# Patient Record
Sex: Female | Born: 1955
Health system: Southern US, Community
[De-identification: ages and names within clinical notes are randomized; demographics above are authoritative.]

## PROBLEM LIST (undated history)

## (undated) DIAGNOSIS — M419 Scoliosis, unspecified: Secondary | ICD-10-CM

## (undated) DIAGNOSIS — B3781 Candidal esophagitis: Secondary | ICD-10-CM

## (undated) DIAGNOSIS — M1990 Unspecified osteoarthritis, unspecified site: Secondary | ICD-10-CM

## (undated) DIAGNOSIS — M159 Polyosteoarthritis, unspecified: Secondary | ICD-10-CM

## (undated) DIAGNOSIS — J189 Pneumonia, unspecified organism: Secondary | ICD-10-CM

## (undated) DIAGNOSIS — E538 Deficiency of other specified B group vitamins: Secondary | ICD-10-CM

## (undated) DIAGNOSIS — E785 Hyperlipidemia, unspecified: Secondary | ICD-10-CM

## (undated) DIAGNOSIS — M48061 Spinal stenosis, lumbar region without neurogenic claudication: Secondary | ICD-10-CM

## (undated) DIAGNOSIS — F32A Depression, unspecified: Secondary | ICD-10-CM

## (undated) DIAGNOSIS — T7840XA Allergy, unspecified, initial encounter: Secondary | ICD-10-CM

## (undated) DIAGNOSIS — F419 Anxiety disorder, unspecified: Secondary | ICD-10-CM

## (undated) DIAGNOSIS — D126 Benign neoplasm of colon, unspecified: Secondary | ICD-10-CM

## (undated) DIAGNOSIS — I10 Essential (primary) hypertension: Secondary | ICD-10-CM

## (undated) DIAGNOSIS — H269 Unspecified cataract: Secondary | ICD-10-CM

## (undated) DIAGNOSIS — F329 Major depressive disorder, single episode, unspecified: Secondary | ICD-10-CM

## (undated) DIAGNOSIS — K589 Irritable bowel syndrome without diarrhea: Secondary | ICD-10-CM

## (undated) DIAGNOSIS — Q282 Arteriovenous malformation of cerebral vessels: Secondary | ICD-10-CM

## (undated) DIAGNOSIS — R42 Dizziness and giddiness: Secondary | ICD-10-CM

## (undated) HISTORY — DX: Scoliosis, unspecified: M41.9

## (undated) HISTORY — PX: APPENDECTOMY: SHX54

## (undated) HISTORY — DX: Polyosteoarthritis, unspecified: M15.9

## (undated) HISTORY — DX: Hyperlipidemia, unspecified: E78.5

## (undated) HISTORY — DX: Essential (primary) hypertension: I10

## (undated) HISTORY — DX: Major depressive disorder, single episode, unspecified: F32.9

## (undated) HISTORY — DX: Candidal esophagitis: B37.81

## (undated) HISTORY — DX: Unspecified osteoarthritis, unspecified site: M19.90

## (undated) HISTORY — PX: OOPHORECTOMY: SHX86

## (undated) HISTORY — DX: Unspecified cataract: H26.9

## (undated) HISTORY — DX: Allergy, unspecified, initial encounter: T78.40XA

## (undated) HISTORY — DX: Spinal stenosis, lumbar region without neurogenic claudication: M48.061

## (undated) HISTORY — DX: Arteriovenous malformation of cerebral vessels: Q28.2

## (undated) HISTORY — PX: CARDIAC CATHETERIZATION: SHX172

## (undated) HISTORY — DX: Deficiency of other specified B group vitamins: E53.8

## (undated) HISTORY — DX: Depression, unspecified: F32.A

## (undated) HISTORY — DX: Benign neoplasm of colon, unspecified: D12.6

## (undated) HISTORY — PX: ABDOMINAL HYSTERECTOMY: SHX81

## (undated) HISTORY — PX: NASAL SINUS SURGERY: SHX719

## (undated) HISTORY — PX: TONSILLECTOMY: SUR1361

---

## 2001-02-15 ENCOUNTER — Other Ambulatory Visit: Admission: RE | Admit: 2001-02-15 | Discharge: 2001-02-15 | Payer: Self-pay | Admitting: Family Medicine

## 2005-01-09 ENCOUNTER — Ambulatory Visit: Payer: Self-pay | Admitting: Specialist

## 2005-02-02 ENCOUNTER — Ambulatory Visit: Payer: Self-pay | Admitting: Specialist

## 2005-02-20 HISTORY — PX: SHOULDER ARTHROSCOPY WITH ROTATOR CUFF REPAIR AND SUBACROMIAL DECOMPRESSION: SHX5686

## 2006-07-23 ENCOUNTER — Encounter: Admission: RE | Admit: 2006-07-23 | Discharge: 2006-07-23 | Payer: Self-pay | Admitting: Orthopedic Surgery

## 2007-02-05 ENCOUNTER — Encounter (INDEPENDENT_AMBULATORY_CARE_PROVIDER_SITE_OTHER): Payer: Self-pay | Admitting: Gastroenterology

## 2007-02-05 ENCOUNTER — Ambulatory Visit (HOSPITAL_COMMUNITY): Admission: RE | Admit: 2007-02-05 | Discharge: 2007-02-05 | Payer: Self-pay | Admitting: Gastroenterology

## 2007-02-07 ENCOUNTER — Ambulatory Visit: Payer: Self-pay | Admitting: Pain Medicine

## 2007-02-16 ENCOUNTER — Encounter: Admission: RE | Admit: 2007-02-16 | Discharge: 2007-02-16 | Payer: Self-pay

## 2007-03-07 ENCOUNTER — Encounter: Admission: RE | Admit: 2007-03-07 | Discharge: 2007-03-07 | Payer: Self-pay | Admitting: Neurology

## 2007-05-30 ENCOUNTER — Encounter: Admission: RE | Admit: 2007-05-30 | Discharge: 2007-05-30 | Payer: Self-pay | Admitting: Neurology

## 2007-06-13 ENCOUNTER — Encounter: Admission: RE | Admit: 2007-06-13 | Discharge: 2007-06-13 | Payer: Self-pay | Admitting: Neurology

## 2008-04-07 ENCOUNTER — Emergency Department (HOSPITAL_COMMUNITY): Admission: EM | Admit: 2008-04-07 | Discharge: 2008-04-07 | Payer: Self-pay | Admitting: Emergency Medicine

## 2008-07-22 ENCOUNTER — Ambulatory Visit: Payer: Self-pay | Admitting: Family

## 2009-10-27 ENCOUNTER — Encounter: Admission: RE | Admit: 2009-10-27 | Discharge: 2009-10-27 | Payer: Self-pay | Admitting: Neurology

## 2009-11-02 ENCOUNTER — Ambulatory Visit: Payer: Self-pay | Admitting: Cardiology

## 2009-11-11 ENCOUNTER — Encounter: Admission: RE | Admit: 2009-11-11 | Discharge: 2009-11-11 | Payer: Self-pay | Admitting: Neurology

## 2009-12-15 ENCOUNTER — Encounter: Admission: RE | Admit: 2009-12-15 | Discharge: 2009-12-15 | Payer: Self-pay | Admitting: Neurology

## 2010-04-20 ENCOUNTER — Ambulatory Visit: Payer: Self-pay | Admitting: Internal Medicine

## 2010-06-07 LAB — CBC
HCT: 35.6 % — ABNORMAL LOW (ref 36.0–46.0)
Hemoglobin: 12.5 g/dL (ref 12.0–15.0)
MCHC: 35.1 g/dL (ref 30.0–36.0)
MCV: 96.4 fL (ref 78.0–100.0)
Platelets: 383 10*3/uL (ref 150–400)
RBC: 3.7 MIL/uL — ABNORMAL LOW (ref 3.87–5.11)
RDW: 12.5 % (ref 11.5–15.5)
WBC: 7.1 10*3/uL (ref 4.0–10.5)

## 2010-06-07 LAB — BASIC METABOLIC PANEL
BUN: 23 mg/dL (ref 6–23)
CO2: 22 mEq/L (ref 19–32)
Calcium: 9 mg/dL (ref 8.4–10.5)
Chloride: 105 mEq/L (ref 96–112)
Creatinine, Ser: 0.87 mg/dL (ref 0.4–1.2)
GFR calc Af Amer: >60 mL/min (ref >60)
GFR calc non Af Amer: >60 mL/min (ref >60)
Glucose, Bld: 89 mg/dL (ref 70–99)
Potassium: 3.7 mEq/L (ref 3.5–5.1)
Sodium: 136 mEq/L (ref 135–145)

## 2010-06-07 LAB — POCT CARDIAC MARKERS
CKMB, poc: <1 ng/mL — ABNORMAL LOW (ref 1.0–8.0)
Myoglobin, poc: 72.6 ng/mL (ref 12–200)
Troponin i, poc: <0.05 ng/mL (ref 0.00–0.09)

## 2010-07-04 ENCOUNTER — Other Ambulatory Visit: Payer: Self-pay | Admitting: Orthopedic Surgery

## 2010-07-04 DIAGNOSIS — M25511 Pain in right shoulder: Secondary | ICD-10-CM

## 2010-07-05 NOTE — Op Note (Signed)
NAMEEMMALOU, HUNGER               ACCOUNT NO.:  [id number]   MEDICAL RECORD NO.:  [id number]          PATIENT TYPE:  AMB   LOCATION:  ENDO                         FACILITY:  [facility]   PHYSICIAN:  Anselmo Rod, M.D.  DATE OF BIRTH:  Mar 04, 1955   DATE OF PROCEDURE:  02/05/2007  DATE OF DISCHARGE:                               OPERATIVE REPORT   PROCEDURE PERFORMED:  Colonoscopy with cold biopsies x 4.   ENDOSCOPIST:  Anselmo Rod, M.D.   INSTRUMENT USED:  Pentax video colonoscope.   INDICATIONS FOR PROCEDURE:  A 55 year old white female undergoing a  screening colonoscopy.  The patient has a history of constipation, rule  out colonic polyps, masses, etc.   PREPROCEDURE PREPARATION:  Informed consent was procured from the  patient.  The patient fasted for 4 hours prior to the procedure after  being prepped with 20 OsmoPrep pills the night of and 12 OsmoPrep pills  the morning of the procedure.  The risks and benefits of the procedure  including a 10% miss rate of cancer and polyp were discussed with the  patient as well.   PREPROCEDURE PHYSICAL:  The patient had stable vital signs.  Neck  supple.  Chest clear to auscultation.  S1 S2 regular.  Abdomen soft with  normal bowel sounds.   DESCRIPTION OF PROCEDURE:  The patient was placed in the left lateral  decubitus position and sedated with 110 mcg of Fentanyl and 10 mg of  Versed given intravenously in slow incremental doses.  Once the patient  was adequately sedated and maintained on low-flow oxygen and continuous  cardiac monitoring, the Pentax video colonoscope was advanced from the  rectum to the cecum.  There was significant amount of residual stool in  the colon.  Multiple washes were done. The appendiceal orifice and  ileocecal valve were visualized after changing the patient's position  from the left lateral to supine and the right lateral position and  mobilizing the stool.  The terminal ileum appeared healthy  without  lesions.  A small sessile polyp was biopsied from the cecal base close  to the appendiceal orifice, and the rest of the colonic mucosa appeared  healthy.  Small lesions could be missed.  Retroflexion in the rectum  revealed no abnormalities.  There was no evidence of diverticulosis.  The patient tolerated the procedure well without complications.   IMPRESSION:  1. Small sessile polyp biopsied from the cecal base, otherwise normal      colonoscopy of the terminal ileum.  No evidence of diverticulosis.  2. No abnormalities noted on retroflexion.   RECOMMENDATIONS:  1. Await pathology results.  2. Avoid all nonsteroidals including aspirin for the next 2 weeks.  3. Repeat colonoscopy depending on pathology results.  4. Outpatient followup as need rises in the future. The importance of      a high-fiber diet with liberal fluid intake has been emphasized.      Anselmo Rod, M.D.  Electronically Signed     JNM/MEDQ  D:  02/05/2007  T:  02/05/2007  Job:  [id number]

## 2010-07-09 ENCOUNTER — Ambulatory Visit
Admission: RE | Admit: 2010-07-09 | Discharge: 2010-07-09 | Disposition: A | Discharge status: Home / Self Care | Payer: Managed Care, Other (non HMO) | Source: Ambulatory Visit | Attending: Orthopedic Surgery | Admitting: Orthopedic Surgery

## 2010-07-09 DIAGNOSIS — M25511 Pain in right shoulder: Secondary | ICD-10-CM

## 2010-11-01 ENCOUNTER — Other Ambulatory Visit: Payer: Self-pay | Admitting: Neurology

## 2010-11-01 DIAGNOSIS — M545 Low back pain, unspecified: Secondary | ICD-10-CM

## 2010-11-01 DIAGNOSIS — M5410 Radiculopathy, site unspecified: Secondary | ICD-10-CM

## 2010-11-03 ENCOUNTER — Ambulatory Visit
Admission: RE | Admit: 2010-11-03 | Discharge: 2010-11-03 | Disposition: A | Discharge status: Home / Self Care | Payer: Managed Care, Other (non HMO) | Source: Ambulatory Visit | Attending: Neurology | Admitting: Neurology

## 2010-11-03 DIAGNOSIS — M5410 Radiculopathy, site unspecified: Secondary | ICD-10-CM

## 2010-11-03 DIAGNOSIS — M545 Low back pain, unspecified: Secondary | ICD-10-CM

## 2010-11-03 MED ORDER — METHYLPREDNISOLONE ACETATE 40 MG/ML INJ SUSP (RADIOLOG
120.0000 mg | Freq: Once | INTRAMUSCULAR | Status: AC
  Administered 2010-11-03: 120 mg via EPIDURAL

## 2010-11-03 MED ORDER — IOHEXOL 180 MG/ML  SOLN
1.0000 mL | Freq: Once | INTRAMUSCULAR | Status: AC | PRN
  Administered 2010-11-03: 1 mL via EPIDURAL

## 2010-11-07 ENCOUNTER — Other Ambulatory Visit: Payer: Self-pay | Admitting: Neurology

## 2010-11-07 DIAGNOSIS — M549 Dorsalgia, unspecified: Secondary | ICD-10-CM

## 2010-11-07 DIAGNOSIS — M5410 Radiculopathy, site unspecified: Secondary | ICD-10-CM

## 2010-11-17 ENCOUNTER — Ambulatory Visit
Admission: RE | Admit: 2010-11-17 | Discharge: 2010-11-17 | Disposition: A | Discharge status: Home / Self Care | Payer: Managed Care, Other (non HMO) | Source: Ambulatory Visit | Attending: Neurology | Admitting: Neurology

## 2010-11-17 DIAGNOSIS — M5410 Radiculopathy, site unspecified: Secondary | ICD-10-CM

## 2010-11-17 DIAGNOSIS — M549 Dorsalgia, unspecified: Secondary | ICD-10-CM

## 2010-11-17 MED ORDER — METHYLPREDNISOLONE ACETATE 40 MG/ML INJ SUSP (RADIOLOG: 120.0000 mg | Freq: Once | INTRAMUSCULAR | Status: DC

## 2010-11-17 MED ORDER — IOHEXOL 180 MG/ML  SOLN: 1.0000 mL | Freq: Once | INTRAMUSCULAR | Status: AC | PRN

## 2010-12-02 ENCOUNTER — Encounter: Payer: Self-pay | Admitting: Internal Medicine

## 2010-12-02 ENCOUNTER — Ambulatory Visit (INDEPENDENT_AMBULATORY_CARE_PROVIDER_SITE_OTHER)
Admission: RE | Admit: 2010-12-02 | Discharge: 2010-12-02 | Disposition: A | Discharge status: Home / Self Care | Payer: Managed Care, Other (non HMO) | Source: Ambulatory Visit | Attending: Internal Medicine | Admitting: Internal Medicine

## 2010-12-02 ENCOUNTER — Ambulatory Visit (INDEPENDENT_AMBULATORY_CARE_PROVIDER_SITE_OTHER): Payer: Managed Care, Other (non HMO) | Admitting: Internal Medicine

## 2010-12-02 DIAGNOSIS — K5792 Diverticulitis of intestine, part unspecified, without perforation or abscess without bleeding: Secondary | ICD-10-CM

## 2010-12-02 DIAGNOSIS — K5900 Constipation, unspecified: Secondary | ICD-10-CM

## 2010-12-02 DIAGNOSIS — R059 Cough, unspecified: Secondary | ICD-10-CM

## 2010-12-02 DIAGNOSIS — R109 Unspecified abdominal pain: Secondary | ICD-10-CM

## 2010-12-02 DIAGNOSIS — K5732 Diverticulitis of large intestine without perforation or abscess without bleeding: Secondary | ICD-10-CM

## 2010-12-02 DIAGNOSIS — R05 Cough: Secondary | ICD-10-CM

## 2010-12-02 MED ORDER — LACTULOSE 20 GM/30ML PO SOLN: 30.0000 mL | Freq: Four times a day (QID) | ORAL | Status: DC | PRN

## 2010-12-02 MED ORDER — METHOCARBAMOL 750 MG PO TABS: 750.0000 mg | ORAL_TABLET | ORAL | Status: DC | PRN

## 2010-12-02 MED ORDER — LEVOFLOXACIN 500 MG PO TABS: 500.0000 mg | ORAL_TABLET | Freq: Every day | ORAL | Status: AC

## 2010-12-02 MED ORDER — HYDROCODONE-ACETAMINOPHEN 5-500 MG PO TABS: 1.0000 | ORAL_TABLET | Freq: Four times a day (QID) | ORAL | Status: DC | PRN

## 2010-12-02 MED ORDER — METRONIDAZOLE 500 MG PO TABS: 500.0000 mg | ORAL_TABLET | Freq: Three times a day (TID) | ORAL | Status: AC

## 2010-12-02 MED ORDER — BUPROPION HCL ER (SR) 150 MG PO TB12: 150.0000 mg | ORAL_TABLET | Freq: Every day | ORAL | Status: DC

## 2010-12-02 NOTE — Patient Instructions (Addendum)
Your right eardrum is not enflamed but is dull appearing, so I would like you to try sudafed PE 10 mg every 6 hours as needed for congestion.  I am prescribing  Metronidazole and levaquin for the bowels, which will also cover any atypical pneumonia.  DO NOT drink any alcohol bc it will interact with the abx and make you sick as a dog.   Take the prilosec daily in the morning to prevent reflux,  You can take a swig of mylanta or gaviscon if you have heartburn.  Labs today here,,  X ray of chest and abdomen at [facility].    Lactulose is a liquid cathartic laxative 30 mls every 6 hours   Office number is  [phone]

## 2010-12-02 NOTE — Progress Notes (Signed)
  Subjective:    Patient ID: Adrienne Robinson, female    DOB: 26-Jul-1955, 55 y.o.   MRN: [id number]  HPI  55 yo white female with history of spinal stenosis,  Colitis on most recent colonoscopy , treated with steroids for several months with resolution of symptoms preetns with several unrealted issues.  Her most worrisome is a one week history of abdominal distension, nausea, diffuse abdominal pain and decreased caliber of stool without blood, mucus or diarhea.  She has developed new onset heartburn as well.  Started taking OTC prilosec about one week ago.  Seconda complaint is 2 days history of scratchy throat, productive cough and sinus drainage with congestion and pain.   Past Medical History  Diagnosis Date  . Hypertension   . Depression   . Degenerative joint disease involving multiple joints   . Spinal stenosis of lumbar region    No current outpatient prescriptions on file prior to visit.      Review of Systems  Constitutional: Negative for fever, chills and unexpected weight change.  HENT: Positive for congestion, rhinorrhea and postnasal drip. Negative for hearing loss, ear pain, nosebleeds, sore throat, facial swelling, sneezing, mouth sores, trouble swallowing, neck pain, neck stiffness, voice change, sinus pressure, tinnitus and ear discharge.   Eyes: Negative for pain, discharge, redness and visual disturbance.  Respiratory: Positive for cough. Negative for chest tightness, shortness of breath, wheezing and stridor.   Cardiovascular: Negative for chest pain, palpitations and leg swelling.  Gastrointestinal: Positive for abdominal pain, constipation and abdominal distention. Negative for blood in stool and anal bleeding.  Musculoskeletal: Negative for myalgias and arthralgias.  Skin: Negative for color change and rash.  Neurological: Negative for dizziness, weakness, light-headedness and headaches.  Hematological: Negative for adenopathy.       Objective:   Physical Exam    Constitutional: She is oriented to person, place, and time. She appears well-developed and well-nourished.  HENT:  Mouth/Throat: Oropharynx is clear and moist.  Eyes: EOM are normal. Pupils are equal, round, and reactive to light. No scleral icterus.  Neck: Normal range of motion. Neck supple. No JVD present. No thyromegaly present.  Cardiovascular: Normal rate, regular rhythm, normal heart sounds and intact distal pulses.   Pulmonary/Chest: Effort normal and breath sounds normal.  Abdominal: Soft. Bowel sounds are normal. She exhibits distension. She exhibits no mass. There is tenderness. There is no rebound and no guarding.  Musculoskeletal: Normal range of motion. She exhibits no edema.  Lymphadenopathy:    She has no cervical adenopathy.  Neurological: She is alert and oriented to person, place, and time.  Skin: Skin is warm and dry.  Psychiatric: She has a normal mood and affect.   BP 122/74  Pulse 81  Temp(Src) 97.9 F (36.6 C) (Oral)  Resp 16  Ht 5\' 4"  (1.626 m)  Wt 131 lb 8 oz (59.648 kg)  BMI 22.57 kg/m2  SpO2 94%      Assessment & Plan:  1. Abdominal pain:  ddx includes constipation, diverticulitis and recurrence of colitis.  Plain films confirm constipation and bg pattern is normal.  Will treat for diverticulitis/infectious colitis with levaquin and flagyl.  If no improvement in 1 weeks will need CT scan of abdomen adn pelvis with contrast.    2. URI:  Likely viral at this point, but she has a history of  Frequent sinusitis.  For this reason I chose Levaquin instead of ciprofloxacin.

## 2010-12-03 LAB — COMPREHENSIVE METABOLIC PANEL
ALT: 23 U/L (ref 0–35)
AST: 25 U/L (ref 0–37)
Albumin: 4.4 g/dL (ref 3.5–5.2)
Alkaline Phosphatase: 50 U/L (ref 39–117)
BUN: 16 mg/dL (ref 6–23)
CO2: 20 mEq/L (ref 19–32)
Calcium: 9.3 mg/dL (ref 8.4–10.5)
Chloride: 103 mEq/L (ref 96–112)
Creat: 0.92 mg/dL (ref 0.50–1.10)
Glucose, Bld: 73 mg/dL (ref 70–99)
Potassium: 4.2 mEq/L (ref 3.5–5.3)
Sodium: 136 mEq/L (ref 135–145)
Total Bilirubin: 0.4 mg/dL (ref 0.3–1.2)
Total Protein: 6.4 g/dL (ref 6.0–8.3)

## 2010-12-03 LAB — CBC WITH DIFFERENTIAL/PLATELET
Basophils Absolute: 0 10*3/uL (ref 0.0–0.1)
Basophils Relative: 0 % (ref 0–1)
Eosinophils Absolute: 0.1 10*3/uL (ref 0.0–0.7)
Eosinophils Relative: 1 % (ref 0–5)
HCT: 32.9 % — ABNORMAL LOW (ref 36.0–46.0)
Hemoglobin: 10.9 g/dL — ABNORMAL LOW (ref 12.0–15.0)
Lymphocytes Relative: 23 % (ref 12–46)
Lymphs Abs: 1.3 10*3/uL (ref 0.7–4.0)
MCH: 31.6 pg (ref 26.0–34.0)
MCHC: 33.1 g/dL (ref 30.0–36.0)
MCV: 95.4 fL (ref 78.0–100.0)
Monocytes Absolute: 0.5 10*3/uL (ref 0.1–1.0)
Monocytes Relative: 8 % (ref 3–12)
Neutro Abs: 4 10*3/uL (ref 1.7–7.7)
Neutrophils Relative %: 69 % (ref 43–77)
Platelets: 281 10*3/uL (ref 150–400)
RBC: 3.45 MIL/uL — ABNORMAL LOW (ref 3.87–5.11)
RDW: 13.3 % (ref 11.5–15.5)
WBC: 5.9 10*3/uL (ref 4.0–10.5)

## 2010-12-03 LAB — LIPASE: Lipase: 61 U/L (ref 0–75)

## 2010-12-04 ENCOUNTER — Encounter: Payer: Self-pay | Admitting: Internal Medicine

## 2010-12-04 DIAGNOSIS — M48061 Spinal stenosis, lumbar region without neurogenic claudication: Secondary | ICD-10-CM | POA: Insufficient documentation

## 2010-12-04 DIAGNOSIS — I10 Essential (primary) hypertension: Secondary | ICD-10-CM | POA: Insufficient documentation

## 2010-12-04 DIAGNOSIS — M159 Polyosteoarthritis, unspecified: Secondary | ICD-10-CM | POA: Insufficient documentation

## 2010-12-04 DIAGNOSIS — F32A Depression, unspecified: Secondary | ICD-10-CM | POA: Insufficient documentation

## 2010-12-04 DIAGNOSIS — F329 Major depressive disorder, single episode, unspecified: Secondary | ICD-10-CM | POA: Insufficient documentation

## 2010-12-08 ENCOUNTER — Other Ambulatory Visit: Payer: Self-pay | Admitting: Internal Medicine

## 2010-12-08 NOTE — Telephone Encounter (Signed)
Patient called to let you know that she has gone by [facility] to sign release forms. She also would like to pick up methocarbamol & hydrocodone [facility]ill [facility] [address]

## 2010-12-09 MED ORDER — HYDROCODONE-ACETAMINOPHEN 5-500 MG PO TABS: 1.0000 | ORAL_TABLET | Freq: Four times a day (QID) | ORAL | Status: DC | PRN

## 2010-12-09 MED ORDER — METHOCARBAMOL 750 MG PO TABS: 750.0000 mg | ORAL_TABLET | ORAL | Status: DC | PRN

## 2010-12-13 NOTE — Telephone Encounter (Signed)
Per pharmacist no refills were called for this patient on 10/19, although pt has refills in her profile.   Pt called pharmacy for refill today and that is waiting for her at pharmacy.  No additional refills called.  Ending follow up.

## 2010-12-16 ENCOUNTER — Other Ambulatory Visit: Payer: Self-pay | Admitting: Internal Medicine

## 2010-12-16 MED ORDER — METHOCARBAMOL 750 MG PO TABS: 750.0000 mg | ORAL_TABLET | ORAL | Status: DC | PRN

## 2010-12-16 MED ORDER — HYDROCODONE-ACETAMINOPHEN 5-500 MG PO TABS: 1.0000 | ORAL_TABLET | Freq: Four times a day (QID) | ORAL | Status: DC | PRN

## 2010-12-29 ENCOUNTER — Encounter: Payer: Self-pay | Admitting: Internal Medicine

## 2011-01-06 ENCOUNTER — Telehealth: Payer: Self-pay | Admitting: Internal Medicine

## 2011-01-06 MED ORDER — FERROUS SULFATE 325 (65 FE) MG PO TBEC: 325.0000 mg | DELAYED_RELEASE_TABLET | Freq: Every day | ORAL | Status: DC

## 2011-01-06 NOTE — Telephone Encounter (Signed)
Patient called and stated we told her

## 2011-01-06 NOTE — Telephone Encounter (Signed)
Patient called and stated on her lab results she was told that she was slightly anemic and she has been experiencing restless leg syndrome every night.  She wanted to know if an iron supplement could be called in to help with this.  Please advise.

## 2011-01-06 NOTE — Telephone Encounter (Signed)
[facility] ,  One time daily after breakkfast

## 2011-01-09 ENCOUNTER — Other Ambulatory Visit: Payer: Self-pay | Admitting: Neurology

## 2011-01-09 DIAGNOSIS — IMO0002 Reserved for concepts with insufficient information to code with codable children: Secondary | ICD-10-CM

## 2011-01-09 NOTE — Telephone Encounter (Signed)
Patient notified of Rx.  

## 2011-01-10 ENCOUNTER — Ambulatory Visit
Admission: RE | Admit: 2011-01-10 | Discharge: 2011-01-10 | Disposition: A | Discharge status: Home / Self Care | Payer: Managed Care, Other (non HMO) | Source: Ambulatory Visit | Attending: Neurology | Admitting: Neurology

## 2011-01-10 DIAGNOSIS — IMO0002 Reserved for concepts with insufficient information to code with codable children: Secondary | ICD-10-CM

## 2011-01-10 MED ORDER — IOHEXOL 180 MG/ML  SOLN
1.0000 mL | Freq: Once | INTRAMUSCULAR | Status: AC | PRN
  Administered 2011-01-10: 1 mL via EPIDURAL

## 2011-01-10 MED ORDER — METHYLPREDNISOLONE ACETATE 40 MG/ML INJ SUSP (RADIOLOG
120.0000 mg | Freq: Once | INTRAMUSCULAR | Status: AC
  Administered 2011-01-10: 120 mg via EPIDURAL

## 2011-01-24 ENCOUNTER — Telehealth: Payer: Self-pay | Admitting: *Deleted

## 2011-01-24 MED ORDER — OSELTAMIVIR PHOSPHATE 75 MG PO CAPS: 75.0000 mg | ORAL_CAPSULE | Freq: Two times a day (BID) | ORAL | Status: DC

## 2011-01-24 MED ORDER — OSELTAMIVIR PHOSPHATE 75 MG PO CAPS: 75.0000 mg | ORAL_CAPSULE | Freq: Two times a day (BID) | ORAL | Status: AC

## 2011-01-24 NOTE — Telephone Encounter (Signed)
Rx for tamiflu granted  Because it is easier tho say yes than deal with repeat demand.s Please call in .  Remind the patient that in the absence of  diffuse myalgias,  Temp greater than 101,  i ti unlikel yto be influenza and more likley to be the virus that everyone else has.  Warn her that is is several hundred dollars even with insurance

## 2011-01-24 NOTE — Telephone Encounter (Signed)
Patient informed and RX sent to [place]

## 2011-01-24 NOTE — Telephone Encounter (Signed)
Pt is requesting RX for tamiflu. She says she wants to "get ahead" of "this", pt c/o sore throat and "knows" she is getting the flu. She also called around to multiple pharmacies in [place] and was told it was not avail. She has found it in [place] at [facility]. I advised pt that in the absence of high fever or any other symptoms I doubt that MD would prescribe tamiflu. She still insists that we request Rx from MD.

## 2011-02-17 ENCOUNTER — Telehealth: Payer: Self-pay | Admitting: Internal Medicine

## 2011-02-17 DIAGNOSIS — J310 Chronic rhinitis: Secondary | ICD-10-CM

## 2011-02-17 MED ORDER — FLUTICASONE PROPIONATE 50 MCG/ACT NA SUSP: 2.0000 | Freq: Every day | NASAL | Status: DC

## 2011-02-17 NOTE — Telephone Encounter (Signed)
Refill sent to [facility] per chart.

## 2011-02-17 NOTE — Telephone Encounter (Signed)
Fluticasone 50 mcg nasal spray.  Patient is needing a refill.

## 2011-03-22 ENCOUNTER — Other Ambulatory Visit: Payer: Self-pay | Admitting: *Deleted

## 2011-03-22 MED ORDER — ZOLPIDEM TARTRATE 10 MG PO TABS: 10.0000 mg | ORAL_TABLET | Freq: Every evening | ORAL | Status: DC | PRN

## 2011-03-22 NOTE — Telephone Encounter (Signed)
[facility] 

## 2011-03-22 NOTE — Telephone Encounter (Signed)
Faxed request from [facility] [address], no last date filled given.

## 2011-03-30 ENCOUNTER — Telehealth: Payer: Self-pay | Admitting: Internal Medicine

## 2011-03-30 NOTE — Telephone Encounter (Signed)
[facility] Triage Call Report Triage Record Num: [id number] Operator: Albertine Grates Patient Name: Adrienne Robinson Call Date & Time: 03/30/2011 4:00:27PM Patient Phone: [phone] PCP: Duncan Dull Patient Gender: Female PCP Fax : [phone] Patient DOB: [birth year] Practice Name: [facility] Reason for Call: Caller: Rhythm/Patient; PCP: Duncan Dull; CB#: [phone]; ; ; Call regarding Allergy Sx: Eyes, Nose, Cough; Has allergy symptoms since 2-4. Eyes are watering, nose draining and Fluticazone is not helping. States cannot take Sudaphed. Has taken Claritin D but has not helped. Advised appointment but refuses and is wanting MD to give script. Has appointment 2-15 at 0915. PLEASE CALL AND ADVISE IF CAN CALL IN MEDICINE [phone] Protocol(s) Used: Allergies Recommended Outcome per Protocol: See Provider within 24 hours Reason for Outcome: Being treated by provider for allergies AND symptoms not responding or are worsening with prescribed treatment in expected timeframe Care Advice: ~ 03/30/2011 4:07:42PM Page 1 of 1 [email or web address]

## 2011-03-31 NOTE — Telephone Encounter (Signed)
Left message on machine for patient to call back.

## 2011-03-31 NOTE — Telephone Encounter (Signed)
Was this patient called back?  See reponse below,.  thanks

## 2011-03-31 NOTE — Telephone Encounter (Signed)
She needs to try Allegra and Zyrtec if claritin is not helping.  If she has tried both of those,  rx singulair 10 mg one tablet daily  #30 no refills

## 2011-03-31 NOTE — Telephone Encounter (Signed)
See note from call a nurse.    Patient  has an appt on 04-07-11, but she is asking what she can do in the meantime to help with her allergies.

## 2011-04-04 ENCOUNTER — Other Ambulatory Visit: Payer: Self-pay | Admitting: *Deleted

## 2011-04-05 NOTE — Telephone Encounter (Signed)
Left message advising pt to call back if she is continuing to have problems.

## 2011-04-06 ENCOUNTER — Emergency Department: Payer: Self-pay | Admitting: Unknown Physician Specialty

## 2011-04-07 ENCOUNTER — Encounter: Payer: Managed Care, Other (non HMO) | Admitting: Internal Medicine

## 2011-04-13 ENCOUNTER — Telehealth: Payer: Self-pay | Admitting: Internal Medicine

## 2011-04-13 MED ORDER — HYDROCODONE-ACETAMINOPHEN 5-500 MG PO TABS: 1.0000 | ORAL_TABLET | Freq: Four times a day (QID) | ORAL | Status: DC | PRN

## 2011-04-13 NOTE — Telephone Encounter (Signed)
[phone] Pt called to get refill on hydrocodone  She had an appointment 05/04/11 for cpx

## 2011-04-13 NOTE — Telephone Encounter (Signed)
Ok to phone in refill for #120 plus 3 refills

## 2011-04-13 NOTE — Telephone Encounter (Signed)
[facility] pharmacist said that pt got 15 vicodin from ER today and has already had it filled.  She said that pt always asks for early refills.

## 2011-04-14 NOTE — Telephone Encounter (Signed)
Ok,  Please notify that patient that refill not authorized for one week bc of ER meds.  And ask pharmacy for printout

## 2011-04-14 NOTE — Telephone Encounter (Signed)
Asked pharmacy to fax print out of refills, left message on voice mail asking pt to return the call.

## 2011-04-18 ENCOUNTER — Telehealth: Payer: Self-pay | Admitting: *Deleted

## 2011-04-18 NOTE — Telephone Encounter (Signed)
Opened in error

## 2011-04-21 ENCOUNTER — Other Ambulatory Visit: Payer: Self-pay | Admitting: *Deleted

## 2011-04-21 MED ORDER — HYDROCODONE-ACETAMINOPHEN 5-500 MG PO TABS: 1.0000 | ORAL_TABLET | Freq: Four times a day (QID) | ORAL | Status: DC | PRN

## 2011-04-21 NOTE — Telephone Encounter (Signed)
Rx called in per Dr. Darrick Huntsman, also patient advised that she must make appt before further refills to discuss use of meds.

## 2011-05-04 ENCOUNTER — Ambulatory Visit (INDEPENDENT_AMBULATORY_CARE_PROVIDER_SITE_OTHER): Payer: Managed Care, Other (non HMO) | Admitting: Internal Medicine

## 2011-05-04 ENCOUNTER — Encounter: Payer: Self-pay | Admitting: Internal Medicine

## 2011-05-04 VITALS — BP 108/60 | HR 68 | Temp 98.2°F | Resp 16 | Ht 65.0 in | Wt 126.5 lb

## 2011-05-04 DIAGNOSIS — I10 Essential (primary) hypertension: Secondary | ICD-10-CM

## 2011-05-04 DIAGNOSIS — E785 Hyperlipidemia, unspecified: Secondary | ICD-10-CM

## 2011-05-04 DIAGNOSIS — Z0000 Encounter for general adult medical examination without abnormal findings: Secondary | ICD-10-CM

## 2011-05-04 DIAGNOSIS — M48061 Spinal stenosis, lumbar region without neurogenic claudication: Secondary | ICD-10-CM

## 2011-05-04 DIAGNOSIS — M25551 Pain in right hip: Secondary | ICD-10-CM

## 2011-05-04 DIAGNOSIS — R739 Hyperglycemia, unspecified: Secondary | ICD-10-CM

## 2011-05-04 DIAGNOSIS — R7309 Other abnormal glucose: Secondary | ICD-10-CM

## 2011-05-04 DIAGNOSIS — M25559 Pain in unspecified hip: Secondary | ICD-10-CM

## 2011-05-04 MED ORDER — LIDOCAINE 5 % EX PTCH: 1.0000 | MEDICATED_PATCH | CUTANEOUS | Status: AC

## 2011-05-04 MED ORDER — HYDROCODONE-ACETAMINOPHEN 10-750 MG PO TABS: 1.0000 | ORAL_TABLET | Freq: Four times a day (QID) | ORAL | Status: DC | PRN

## 2011-05-04 NOTE — Patient Instructions (Signed)
Cetirizine is generic zyrtec  10 mg daily  .Marland Kitchen  Fexofenadine is generic allegra. Max dose is 180 mg daily.     Add to to nasonex  For daily use.

## 2011-05-06 ENCOUNTER — Encounter: Payer: Self-pay | Admitting: Internal Medicine

## 2011-05-06 NOTE — Progress Notes (Signed)
Subjective:    Patient ID: Adrienne Robinson, female    DOB: 1955-09-19, 56 y.o.   MRN: [id number]  HPI  Adrienne Robinson is a 56 year old white female with a history of hyperlipidemia, spinal stenosis with chronic back pain and degenerative arthritis with multiple joints involved who presents for her annual physical exam. She is status post total bowel hysterectomy several years ago but has not had a pelvic exam in 2 years. She was recently treated in the ER for a dog bite to her lower lip which was assisted by her own dog accidentally. She history with irrigation and antibiotics and stitches this occurred about 2 weeks ago and lip is healing well. She's has some residual swelling of the lower lip but this is has improved greatly compared to the night of the injury, when she showed up at my house at 11 PM requesting  Treatment.  Her chief complaint today is aggravation of low back pain. She uses Vicodin regularly for relief of pain, prescribed by me, and sees Adrienne Robinson, her neurologist who has also tried gabapentin with no significant relief.  She has been prescribed Percocet in the past by Adrienne Robinson for her shoulder pathology but states that this is too strong for her and make it difficult for her to work. 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  Past Medical History  Diagnosis Date  . Hypertension   . Depression   . Degenerative joint disease involving multiple joints   . Spinal stenosis of lumbar region    Current Outpatient Prescriptions on File Prior to Visit  Medication Sig Dispense Refill  . buPROPion (WELLBUTRIN SR) 150 MG 12 hr tablet Take 1 tablet (150 mg total) by mouth daily.  30 tablet  3  . buPROPion (WELLBUTRIN XL) 300 MG 24 hr tablet Take 300 mg by mouth daily.        . fluticasone (FLONASE) 50 MCG/ACT nasal spray Place 2 sprays into the nose  daily.  16 g  6  . lisinopril (PRINIVIL,ZESTRIL) 2.5 MG tablet Take 2.5 mg by mouth daily.            Review of Systems  Constitutional: Negative for fever, chills, appetite change, fatigue and unexpected weight change.  HENT: Negative for hearing loss, ear pain, nosebleeds, congestion, sore throat, facial swelling, rhinorrhea, sneezing, mouth sores, trouble swallowing, neck pain, neck stiffness, voice change, postnasal drip, sinus pressure, tinnitus and ear discharge.   Eyes: Negative for pain, discharge, redness and visual disturbance.  Respiratory: Negative for cough, chest tightness, shortness of breath, wheezing and stridor.   Cardiovascular: Negative for chest pain, palpitations and leg swelling.  Gastrointestinal: Negative for nausea, vomiting, abdominal pain, diarrhea, constipation, blood in stool, abdominal distention and anal bleeding.  Genitourinary: Negative for dysuria and flank pain.  Musculoskeletal: Positive for back pain. Negative for myalgias, arthralgias and gait problem.  Skin: Negative for color change and rash.  Neurological: Negative for dizziness, weakness, light-headedness and headaches.  Hematological: Negative for adenopathy. Does not bruise/bleed easily.  Psychiatric/Behavioral: Negative for suicidal ideas, sleep disturbance and dysphoric mood. The patient is not nervous/anxious.        Objective:   Physical Exam  Constitutional: She is oriented to person, place, and time. She appears well-developed and well-nourished.  HENT:  Mouth/Throat: Oropharynx is clear and moist.  Eyes: EOM are  normal. Pupils are equal, round, and reactive to light. No scleral icterus.  Neck: Normal range of motion. Neck supple. No JVD present. No thyromegaly present.  Cardiovascular: Normal rate, regular rhythm, normal heart sounds and intact distal pulses.   Pulmonary/Chest: Effort normal and breath sounds normal.  Abdominal: Soft. Bowel sounds are normal. She exhibits no mass.  There is no tenderness.  Genitourinary: Vagina normal. No vaginal discharge found.  Musculoskeletal: Normal range of motion. She exhibits no edema.  Lymphadenopathy:    She has no cervical adenopathy.  Neurological: She is alert and oriented to person, place, and time.  Skin: Skin is warm and dry.  Psychiatric: She has a normal mood and affect.      Assessment & Plan:    Spinal stenosis of lumbar region Managed with daily Vicodin and tramadol with when necessary muscle relaxers. Gabapentin caused sedation and dizziness. Refill on Vicodin given today. I discussed her prior use of prescriptions for Percocet given to her by Adrienne Robinson. There was a period of time where her prescriptions from Adrienne Robinson overlapped with mine. I have warned her that it does happen again I will not refill her Vicodin.  Trial of lidocaine patch for initiated today low back pain    Updated Medication List Outpatient Encounter Prescriptions as of 56/14/2013  Medication Sig Dispense Refill  . aspirin 81 MG tablet Take 81 mg by mouth daily.      Marland Kitchen buPROPion (WELLBUTRIN SR) 150 MG 12 hr tablet Take 1 tablet (150 mg total) by mouth daily.  30 tablet  3  . buPROPion (WELLBUTRIN XL) 300 MG 24 hr tablet Take 300 mg by mouth daily.        . fish oil-omega-3 fatty acids 1000 MG capsule Take 2 g by mouth daily.      . fluticasone (FLONASE) 50 MCG/ACT nasal spray Place 2 sprays into the nose daily.  16 g  6  . lisinopril (PRINIVIL,ZESTRIL) 2.5 MG tablet Take 2.5 mg by mouth daily.        . mesalamine (CANASA) 1000 MG suppository Place 1,000 mg rectally at bedtime.      . Probiotic Product (PROBIOTIC FORMULA PO) Take by mouth. ultraspectrum  available at [facility]      . vitamin B-12 (CYANOCOBALAMIN) 1000 MCG tablet Take 1,000 mcg by mouth daily.      Marland Kitchen zolpidem (AMBIEN) 10 MG tablet Take 10 mg by mouth at bedtime as needed.      Marland Kitchen DISCONTD: HYDROcodone-acetaminophen (VICODIN) 5-500 MG per tablet Take 1 tablet by  mouth every 6 (six) hours as needed.  120 tablet  0  . HYDROcodone-acetaminophen (MAXIDONE) 10-750 MG per tablet Take 1 tablet by mouth every 6 (six) hours as needed for pain.  120 tablet  0  . lidocaine (LIDODERM) 5 % Place 1 patch onto the skin daily. Remove & Discard patch within 12 hours or as directed by MD  30 patch  1  . DISCONTD: ferrous sulfate 325 (65 FE) MG EC tablet Take 1 tablet (325 mg total) by mouth daily after breakfast.  30 tablet  3  . DISCONTD: Lactulose 20 GM/30ML SOLN Take 30 mLs (20 g total) by mouth every 6 (six) hours as needed (constipation).  240 mL  0  . DISCONTD: metFORMIN (GLUCOPHAGE) 500 MG tablet Take 500 mg by mouth 2 (two) times daily with a meal.        . DISCONTD: methocarbamol (ROBAXIN) 750 MG tablet Take 1 tablet (750  mg total) by mouth as needed.  30 tablet  3

## 2011-05-06 NOTE — Assessment & Plan Note (Addendum)
Managed with daily Vicodin and tramadol with when necessary muscle relaxers. Gabapentin caused sedation and dizziness. Refill on Vicodin given today. I discussed her prior use of prescriptions for Percocet given to her by Dr. Jillyn Hidden. There was a period of time where her prescriptions from Dr. Shelle Iron overlapped with mine. I have warned her that it does happen again I will not refill her Vicodin.  Trial of lidocaine patch for initiated today low back pain

## 2011-06-06 ENCOUNTER — Other Ambulatory Visit: Payer: Self-pay | Admitting: Internal Medicine

## 2011-06-06 MED ORDER — HYDROCODONE-ACETAMINOPHEN 10-750 MG PO TABS: 1.0000 | ORAL_TABLET | Freq: Four times a day (QID) | ORAL | Status: DC | PRN

## 2011-06-09 ENCOUNTER — Other Ambulatory Visit: Payer: Self-pay | Admitting: Internal Medicine

## 2011-06-09 MED ORDER — ZOLPIDEM TARTRATE 10 MG PO TABS: 10.0000 mg | ORAL_TABLET | Freq: Every evening | ORAL | Status: DC | PRN

## 2011-06-15 ENCOUNTER — Telehealth: Payer: Self-pay | Admitting: *Deleted

## 2011-06-15 MED ORDER — HYDROCODONE-ACETAMINOPHEN 10-750 MG PO TABS: 1.0000 | ORAL_TABLET | Freq: Four times a day (QID) | ORAL | Status: DC | PRN

## 2011-06-15 NOTE — Telephone Encounter (Signed)
Patient called and stated that she only got # 30 for the hydrocodone at her last refill and she has been taking this every 6 hrs as instructed.   I spoke with Dr. Darrick Huntsman regarding this and she gave me the okay to call in new rx for #120 with 2 refills.

## 2011-06-16 ENCOUNTER — Telehealth: Payer: Self-pay | Admitting: Internal Medicine

## 2011-06-16 NOTE — Telephone Encounter (Signed)
error 

## 2011-07-31 ENCOUNTER — Other Ambulatory Visit: Payer: Self-pay | Admitting: Internal Medicine

## 2011-08-01 ENCOUNTER — Other Ambulatory Visit: Payer: Self-pay | Admitting: Internal Medicine

## 2011-08-01 MED ORDER — BUPROPION HCL ER (XL) 300 MG PO TB24: 300.0000 mg | ORAL_TABLET | Freq: Every day | ORAL | Status: DC

## 2011-08-04 ENCOUNTER — Other Ambulatory Visit (INDEPENDENT_AMBULATORY_CARE_PROVIDER_SITE_OTHER): Payer: Managed Care, Other (non HMO) | Admitting: *Deleted

## 2011-08-04 DIAGNOSIS — I10 Essential (primary) hypertension: Secondary | ICD-10-CM

## 2011-08-04 DIAGNOSIS — R739 Hyperglycemia, unspecified: Secondary | ICD-10-CM

## 2011-08-04 DIAGNOSIS — R7309 Other abnormal glucose: Secondary | ICD-10-CM

## 2011-08-04 DIAGNOSIS — E785 Hyperlipidemia, unspecified: Secondary | ICD-10-CM

## 2011-08-04 LAB — MICROALBUMIN / CREATININE URINE RATIO
Creatinine,U: 112.3 mg/dL
Microalb Creat Ratio: 0.4 mg/g (ref 0.0–30.0)
Microalb, Ur: 0.4 mg/dL (ref 0.0–1.9)

## 2011-08-04 LAB — LDL CHOLESTEROL, DIRECT: Direct LDL: 142.7 mg/dL

## 2011-08-04 LAB — LIPID PANEL
Cholesterol: 222 mg/dL — ABNORMAL HIGH (ref 0–200)
HDL: 61.1 mg/dL (ref >39.00)
Total CHOL/HDL Ratio: 4
Triglycerides: 111 mg/dL (ref 0.0–149.0)
VLDL: 22.2 mg/dL (ref 0.0–40.0)

## 2011-08-04 LAB — HEMOGLOBIN A1C: Hgb A1c MFr Bld: 5.7 % (ref 4.6–6.5)

## 2011-08-04 LAB — TSH: TSH: 1.14 u[IU]/mL (ref 0.35–5.50)

## 2011-08-05 LAB — COMPLETE METABOLIC PANEL WITH GFR
ALT: 15 U/L (ref 0–35)
AST: 16 U/L (ref 0–37)
Albumin: 4.4 g/dL (ref 3.5–5.2)
Alkaline Phosphatase: 54 U/L (ref 39–117)
BUN: 16 mg/dL (ref 6–23)
CO2: 25 mEq/L (ref 19–32)
Calcium: 9.2 mg/dL (ref 8.4–10.5)
Chloride: 106 mEq/L (ref 96–112)
Creat: 0.92 mg/dL (ref 0.50–1.10)
GFR, Est African American: 81 mL/min
GFR, Est Non African American: 70 mL/min
Glucose, Bld: 84 mg/dL (ref 70–99)
Potassium: 4.3 mEq/L (ref 3.5–5.3)
Sodium: 141 mEq/L (ref 135–145)
Total Bilirubin: 0.4 mg/dL (ref 0.3–1.2)
Total Protein: 6.5 g/dL (ref 6.0–8.3)

## 2011-08-16 ENCOUNTER — Telehealth: Payer: Self-pay | Admitting: *Deleted

## 2011-08-16 NOTE — Telephone Encounter (Signed)
Patient called back after receiving message regarding her labs, she is asking if you know what studies show on the red yeast rice. Also she is asking if you want her to continue the metformin.

## 2011-08-16 NOTE — Telephone Encounter (Signed)
No she does not need the metformin anymore.  Red yeast rice has been shown to lower LDL and is often tolerated by people who do not tolerate statins.

## 2011-08-17 NOTE — Telephone Encounter (Signed)
left detailed message notifying patient or message.

## 2011-08-18 ENCOUNTER — Other Ambulatory Visit: Payer: Self-pay | Admitting: *Deleted

## 2011-08-18 MED ORDER — ZOLPIDEM TARTRATE 10 MG PO TABS: 10.0000 mg | ORAL_TABLET | Freq: Every evening | ORAL | Status: DC | PRN

## 2011-08-18 NOTE — Telephone Encounter (Signed)
[facility]

## 2011-08-31 ENCOUNTER — Telehealth: Payer: Self-pay | Admitting: *Deleted

## 2011-08-31 MED ORDER — HYDROCODONE-ACETAMINOPHEN 10-750 MG PO TABS: 1.0000 | ORAL_TABLET | Freq: Four times a day (QID) | ORAL | Status: DC | PRN

## 2011-08-31 NOTE — Telephone Encounter (Signed)
The refill should be for #120 tablets,  No 30 ,  Please note this when you call in .

## 2011-08-31 NOTE — Telephone Encounter (Signed)
The hydroc

## 2011-09-01 ENCOUNTER — Other Ambulatory Visit: Payer: Self-pay | Admitting: Internal Medicine

## 2011-09-01 NOTE — Telephone Encounter (Signed)
Rx for #120 called in.

## 2011-09-14 ENCOUNTER — Emergency Department: Payer: Self-pay | Admitting: *Deleted

## 2011-09-14 LAB — URINALYSIS, COMPLETE
Bacteria: NONE SEEN
Bilirubin,UR: NEGATIVE
Blood: NEGATIVE
Glucose,UR: NEGATIVE mg/dL (ref 0–75)
Ketone: NEGATIVE
Leukocyte Esterase: NEGATIVE
Nitrite: NEGATIVE
Ph: 6 (ref 4.5–8.0)
Protein: NEGATIVE
RBC,UR: NONE SEEN /HPF (ref 0–5)
Specific Gravity: 1.008 (ref 1.003–1.030)
Squamous Epithelial: NONE SEEN
WBC UR: <1 /HPF (ref 0–5)

## 2011-09-14 LAB — COMPREHENSIVE METABOLIC PANEL
Albumin: 4.6 g/dL (ref 3.4–5.0)
Alkaline Phosphatase: 75 U/L (ref 50–136)
Anion Gap: 11 (ref 7–16)
BUN: 14 mg/dL (ref 7–18)
Bilirubin,Total: 0.3 mg/dL (ref 0.2–1.0)
Calcium, Total: 9.3 mg/dL (ref 8.5–10.1)
Chloride: 101 mmol/L (ref 98–107)
Co2: 25 mmol/L (ref 21–32)
Creatinine: 0.95 mg/dL (ref 0.60–1.30)
EGFR (African American): >60
EGFR (Non-African Amer.): >60
Glucose: 105 mg/dL — ABNORMAL HIGH (ref 65–99)
Osmolality: 275 (ref 275–301)
Potassium: 4.1 mmol/L (ref 3.5–5.1)
SGOT(AST): 24 U/L (ref 15–37)
SGPT (ALT): 25 U/L
Sodium: 137 mmol/L (ref 136–145)
Total Protein: 7.7 g/dL (ref 6.4–8.2)

## 2011-09-14 LAB — CK TOTAL AND CKMB (NOT AT ARMC)
CK, Total: 82 U/L (ref 21–215)
CK-MB: 1.6 ng/mL (ref 0.5–3.6)

## 2011-09-14 LAB — CBC
HCT: 36.4 % (ref 35.0–47.0)
HGB: 12.2 g/dL (ref 12.0–16.0)
MCH: 31.7 pg (ref 26.0–34.0)
MCHC: 33.6 g/dL (ref 32.0–36.0)
MCV: 94 fL (ref 80–100)
Platelet: 301 10*3/uL (ref 150–440)
RBC: 3.86 10*6/uL (ref 3.80–5.20)
RDW: 12.4 % (ref 11.5–14.5)
WBC: 3.2 10*3/uL — ABNORMAL LOW (ref 3.6–11.0)

## 2011-09-14 LAB — TROPONIN I: Troponin-I: <0.02 ng/mL

## 2011-09-15 ENCOUNTER — Other Ambulatory Visit: Payer: Self-pay | Admitting: Internal Medicine

## 2011-09-27 ENCOUNTER — Ambulatory Visit: Payer: Managed Care, Other (non HMO) | Admitting: Internal Medicine

## 2011-09-28 ENCOUNTER — Telehealth: Payer: Self-pay | Admitting: Internal Medicine

## 2011-09-28 NOTE — Telephone Encounter (Signed)
Patient would like to know who you told her in the past that you would recommend as a fibro myalgia physician.  If she is on the phone when you call her back please leave a message.

## 2011-09-28 NOTE — Telephone Encounter (Signed)
Please tell her that there is noone in this community who treats fibromylagia, because Dr. Kathi Ludwig is leaving and Dr. Gavin Potters, the other rheumatologist does not treat FM

## 2011-09-29 NOTE — Telephone Encounter (Signed)
Left detailed message notifying patient.

## 2011-10-02 ENCOUNTER — Other Ambulatory Visit: Payer: Self-pay | Admitting: *Deleted

## 2011-10-02 MED ORDER — HYDROCODONE-ACETAMINOPHEN 10-750 MG PO TABS: 1.0000 | ORAL_TABLET | Freq: Four times a day (QID) | ORAL | Status: DC | PRN

## 2011-10-03 ENCOUNTER — Other Ambulatory Visit: Payer: Self-pay | Admitting: Neurology

## 2011-10-03 ENCOUNTER — Ambulatory Visit
Admission: RE | Admit: 2011-10-03 | Discharge: 2011-10-03 | Disposition: A | Discharge status: Home / Self Care | Payer: Managed Care, Other (non HMO) | Source: Ambulatory Visit | Attending: Neurology | Admitting: Neurology

## 2011-10-03 DIAGNOSIS — M549 Dorsalgia, unspecified: Secondary | ICD-10-CM

## 2011-10-04 ENCOUNTER — Ambulatory Visit (INDEPENDENT_AMBULATORY_CARE_PROVIDER_SITE_OTHER): Payer: Managed Care, Other (non HMO) | Admitting: Internal Medicine

## 2011-10-04 ENCOUNTER — Encounter: Payer: Self-pay | Admitting: Internal Medicine

## 2011-10-04 VITALS — BP 140/90 | HR 68 | Temp 98.5°F | Wt 122.0 lb

## 2011-10-04 DIAGNOSIS — I10 Essential (primary) hypertension: Secondary | ICD-10-CM

## 2011-10-04 DIAGNOSIS — K5180 Other ulcerative colitis without complications: Secondary | ICD-10-CM

## 2011-10-04 DIAGNOSIS — M549 Dorsalgia, unspecified: Secondary | ICD-10-CM

## 2011-10-04 DIAGNOSIS — M48061 Spinal stenosis, lumbar region without neurogenic claudication: Secondary | ICD-10-CM

## 2011-10-04 DIAGNOSIS — K5190 Ulcerative colitis, unspecified, without complications: Secondary | ICD-10-CM

## 2011-10-04 DIAGNOSIS — K51911 Ulcerative colitis, unspecified with rectal bleeding: Secondary | ICD-10-CM

## 2011-10-04 DIAGNOSIS — Z800 Family history of malignant neoplasm of digestive organs: Secondary | ICD-10-CM | POA: Insufficient documentation

## 2011-10-04 NOTE — Patient Instructions (Addendum)
Resume your [facility] with 5 mg lisinopril , increase in one week if bp is consistently > 130/80.  PT referral for TENs UNIT  Labs today to look for inflammation

## 2011-10-04 NOTE — Progress Notes (Signed)
Patient ID: Adrienne Robinson, female   DOB: [birth year], 56 y.o.   MRN: [id number]   Patient Active Problem List  Diagnosis  . Hypertension  . Depression  . Degenerative joint disease involving multiple joints  . Spinal stenosis of lumbar region  . Family history of colon cancer  . Ulcerative colitis with rectal bleeding    Subjective:  CC:   Chief Complaint  Patient presents with  . Follow-up    having issues with high blood pressure  . Back Pain    HPI:   Adrienne Johnsonis a 56 y.o. female who presents  Past Medical History  Diagnosis Date  . Hypertension   . Depression   . Degenerative joint disease involving multiple joints   . Spinal stenosis of lumbar region     Past Surgical History  Procedure Date  . Joint replacement     shoulder surgery  . Cesarean section   . Abdominal hysterectomy   . Oophorectomy      The following portions of the patient's history were reviewed and updated as appropriate: Allergies, current medications, and problem list.    Review of Systems:   A comprehensive ROS was done and positive for back pain, rectal bleeding and dyspepsia.     History   Social History  . Marital Status: Married    Spouse Name: N/A    Number of Children: N/A  . Years of Education: N/A   Occupational History  . Not on file.   Social History Main Topics  . Smoking status: Never Smoker   . Smokeless tobacco: Never Used  . Alcohol Use: 3.6 oz/week    6 Glasses of wine per week  . Drug Use: No  . Sexually Active: Not on file   Other Topics Concern  . Not on file   Social History Narrative  . No narrative on file    Objective:  BP 140/90  Pulse 68  Temp 98.5 F (36.9 C) (Oral)  Wt 122 lb (55.339 kg)  SpO2 99%  General appearance: alert, cooperative and appears stated age Ears: normal TM's and external ear canals both ears Throat: lips, mucosa, and tongue normal; teeth and gums normal Neck: no adenopathy, no carotid bruit, supple,  symmetrical, trachea midline and thyroid not enlarged, symmetric, no tenderness/mass/nodules Back: symmetric, no curvature. ROM normal. No CVA tenderness. Lungs: clear to auscultation bilaterally Heart: regular rate and rhythm, S1, S2 normal, no murmur, click, rub or gallop Abdomen: soft, non-tender; bowel sounds normal; no masses,  no organomegaly Pulses: 2+ and symmetric Skin: Skin color, texture, turgor normal. No rashes or lesions Rectal: small nontender internal hemorrhoid,  stool heme hegative.   Lymph nodes: Cervical, supraclavicular, and axillary nodes normal.  Assessment and Plan:  Hypertension Recommended increasing lisinopril to 5 mg daily. Renal function was normal in June.  Spinal stenosis of lumbar region Continue use of narcotics as previously prescribed. Follow with Dr. love pending EMG nerve conduction studies. We discussed hand surgeons and Dr. Cheree Ditto and Dr. Lucianne Muss or both excellent surgeons in [place]. She has had prior injections by Dr. Lucianne Muss and we'll followup with him if she needs carpal tunnel surgery.  Ulcerative colitis with rectal bleeding Her rectal exam today noted a small internal hemorrhoid and her stool is heme-negative. However given the fact that she has had episodes of rectal bleeding and has not been taking her Canasa recommended that she resume this medication. I did order a sedimentation rate CRP and CBC to see if she is  having a flare but she left without getting her blood drawn.   Updated Medication List Outpatient Encounter Prescriptions as of 10/04/2011  Medication Sig Dispense Refill  . aspirin 81 MG tablet Take 81 mg by mouth daily.      Marland Kitchen buPROPion (WELLBUTRIN SR) 150 MG 12 hr tablet Take 1 tablet (150 mg total) by mouth daily.  30 tablet  3  . buPROPion (WELLBUTRIN XL) 300 MG 24 hr tablet Take 1 tablet (300 mg total) by mouth daily.  30 tablet  5  . buPROPion (WELLBUTRIN XL) 300 MG 24 hr tablet TAKE 1 TABLET BY MOUTH DAILY  30 tablet  6  .  fish oil-omega-3 fatty acids 1000 MG capsule Take 2 g by mouth daily.      . fluticasone (FLONASE) 50 MCG/ACT nasal spray Place 2 sprays into the nose daily.  16 g  6  . HYDROcodone-acetaminophen (MAXIDONE) 10-750 MG per tablet Take 1 tablet by mouth every 6 (six) hours as needed for pain.  120 tablet  3  . lisinopril (PRINIVIL,ZESTRIL) 5 MG tablet TAKE 1 TABLET BY MOUTH DAILY  90 tablet  3  . mesalamine (CANASA) 1000 MG suppository Place 1,000 mg rectally at bedtime.      . Probiotic Product (PROBIOTIC FORMULA PO) Take by mouth. ultraspectrum  available at [facility]      . vitamin B-12 (CYANOCOBALAMIN) 1000 MCG tablet Take 1,000 mcg by mouth daily.      Marland Kitchen zolpidem (AMBIEN) 10 MG tablet Take 1 tablet (10 mg total) by mouth at bedtime as needed.  30 tablet  2  . DISCONTD: lisinopril (PRINIVIL,ZESTRIL) 2.5 MG tablet Take 2.5 mg by mouth daily.        Marland Kitchen zolpidem (AMBIEN) 10 MG tablet Take 1 tablet (10 mg total) by mouth at bedtime as needed for sleep.  30 tablet  3     Orders Placed This Encounter  Procedures  . Sedimentation rate  . C-reactive protein  . Ambulatory referral to Physical Therapy    No Follow-up on file.

## 2011-10-05 ENCOUNTER — Encounter: Payer: Self-pay | Admitting: Internal Medicine

## 2011-10-05 ENCOUNTER — Other Ambulatory Visit: Payer: Self-pay | Admitting: Neurology

## 2011-10-05 DIAGNOSIS — K51911 Ulcerative colitis, unspecified with rectal bleeding: Secondary | ICD-10-CM | POA: Insufficient documentation

## 2011-10-05 DIAGNOSIS — M5410 Radiculopathy, site unspecified: Secondary | ICD-10-CM

## 2011-10-05 NOTE — Assessment & Plan Note (Signed)
Recommended increasing lisinopril to 5 mg daily. Renal function was normal in June.

## 2011-10-05 NOTE — Assessment & Plan Note (Signed)
Her rectal exam today noted a small internal hemorrhoid and her stool is heme-negative. However given the fact that she has had episodes of rectal bleeding and has not been taking her Canasa recommended that she resume this medication. I did order a sedimentation rate CRP and CBC to see if she is having a flare but she left without getting her blood drawn.

## 2011-10-05 NOTE — Assessment & Plan Note (Signed)
Continue use of narcotics as previously prescribed. Follow with Dr. love pending EMG nerve conduction studies. We discussed hand surgeons and Dr. Cheree Ditto and Dr. Lucianne Muss or both excellent surgeons in [place]. She has had prior injections by Dr. Lucianne Muss and we'll followup with him if she needs carpal tunnel surgery.

## 2011-10-06 ENCOUNTER — Ambulatory Visit
Admission: RE | Admit: 2011-10-06 | Discharge: 2011-10-06 | Disposition: A | Discharge status: Home / Self Care | Payer: Managed Care, Other (non HMO) | Source: Ambulatory Visit | Attending: Neurology | Admitting: Neurology

## 2011-10-06 DIAGNOSIS — M5410 Radiculopathy, site unspecified: Secondary | ICD-10-CM

## 2011-10-06 MED ORDER — IOHEXOL 180 MG/ML  SOLN
1.0000 mL | Freq: Once | INTRAMUSCULAR | Status: AC | PRN
  Administered 2011-10-06: 1 mL via EPIDURAL

## 2011-10-06 MED ORDER — METHYLPREDNISOLONE ACETATE 40 MG/ML INJ SUSP (RADIOLOG
120.0000 mg | Freq: Once | INTRAMUSCULAR | Status: AC
  Administered 2011-10-06: 120 mg via EPIDURAL

## 2011-10-10 ENCOUNTER — Telehealth: Payer: Self-pay | Admitting: Internal Medicine

## 2011-10-10 NOTE — Telephone Encounter (Signed)
Left detailed message notifying patient.

## 2011-10-10 NOTE — Telephone Encounter (Signed)
i would give it another week  Before increasin g the dose of lisinopril to 10 mg daily for bo > 140 on 5 mg dose

## 2011-10-10 NOTE — Telephone Encounter (Signed)
Patient called and stated some days her BP is still high even with the increase in Lisinopril.  She stated yesterday it was 155/85 but she was in a lot of pain.  Today it was 128/78, heart rate is 72-80.  She wanted you to be aware and wanted to know if it needed to be increased again.  Please advise.

## 2011-10-12 ENCOUNTER — Encounter: Payer: Self-pay | Admitting: Internal Medicine

## 2011-10-25 ENCOUNTER — Telehealth: Payer: Self-pay | Admitting: Internal Medicine

## 2011-10-25 ENCOUNTER — Other Ambulatory Visit: Payer: Self-pay | Admitting: Neurology

## 2011-10-25 DIAGNOSIS — M461 Sacroiliitis, not elsewhere classified: Secondary | ICD-10-CM

## 2011-10-25 NOTE — Telephone Encounter (Signed)
Please have pt increase Lisinopril to 10mg  daily and repeat BMP in 1 week. She will also need follow up in 1-2 weeks with Dr. Darrick Huntsman.

## 2011-10-25 NOTE — Telephone Encounter (Signed)
Caller: Helvi/Patient; Patient Name: Adrienne Robinson; PCP: Duncan Dull (Adults only); Best Callback Phone Number: [phone].  Call regarding Follow up with MD after Lisinopril 5mg  not decreasing Blood Pressure after 3 week trial.  BP ranging from normal to 140/90 - 165/94 over last 3 weeks.  BP 165/94 on 9-4 prior to HTN meds.  [facility] [address]. [phone].  All emergent symptoms ruled out per Hypertension Protocol, see in 72 hours.  PLEASE HAVE MD FOLLOW UP WITH PATIENT.

## 2011-10-27 ENCOUNTER — Telehealth: Payer: Self-pay | Admitting: Internal Medicine

## 2011-10-27 NOTE — Telephone Encounter (Signed)
Called to schedule appt for patient to come in for labs, but she wasn't home. Was told that she went to Urgent Care today because she was still having high blood pressure. Patient will call us for follow up.

## 2011-10-27 NOTE — Telephone Encounter (Signed)
Call a nurse called left message stated pt called needed an appointment for a bmp and stated pt needed asap

## 2011-10-27 NOTE — Telephone Encounter (Signed)
° °  Patient calling for followup from her call 10/25/11 concerning Lisinopril dosage and continued elevated blood pressure.  Answer per Dr. Dan Humphreys as below.  Her husband had some procedures this AM and her B/P was 168/98 at about 1000.  She is having some dizziness.  Triaged Hypertension Diagnosed or Suspected and all emergent symptoms ruled out.  Instructed she could take 2.5mg  more Lisinopril now and instructed to take her B/P every 6 hours while awake this weekend to log it for her next office visit 10/06/11 at 0815.  Call back instructions given and transferred to office for appointment for BMP and message left.   [name], MD  10/25/2011  5:01 PM  Signed Please have pt increase Lisinopril to 10mg  daily and repeat BMP in 1 week. She will also need follow up in 1-2 weeks with Dr. Darrick Huntsman.  Darol Destine  10/25/2011  4:00 PM  Signed Caller: Jamarie/Patient; Patient Name: [name]; PCP: Duncan Dull (Adults only); Best Callback Phone Number: [phone].  Call regarding Follow up with MD after Lisinopril 5mg  not decreasing Blood Pressure after 3 week trial.  BP ranging from normal to 140/90 - 165/94 over last 3 weeks.  BP 165/94 on 9-4 prior to HTN meds.  [facility] [address] [phone].  All emergent symptoms ruled out per Hypertension Protocol, see in 72 hours.  PLEASE HAVE MD FOLLOW UP WITH PATIENT

## 2011-10-27 NOTE — Telephone Encounter (Signed)
Patient came in the office today stating that her BP has been high and now she is having pain/discomfort in her chest.  She feels uneasy about increasing  Lisinopril to 20mg .  I advised patient that she needs to be evaluated today and that Dr. Dan Humphreys does not have any available appts.  Advised patient that she needs to go to Urgent Care asap.  She agreed and was going right away.

## 2011-10-30 ENCOUNTER — Telehealth: Payer: Self-pay | Admitting: Internal Medicine

## 2011-10-30 DIAGNOSIS — R002 Palpitations: Secondary | ICD-10-CM

## 2011-10-30 DIAGNOSIS — Z79899 Other long term (current) drug therapy: Secondary | ICD-10-CM

## 2011-10-30 DIAGNOSIS — I10 Essential (primary) hypertension: Secondary | ICD-10-CM

## 2011-10-30 DIAGNOSIS — E785 Hyperlipidemia, unspecified: Secondary | ICD-10-CM

## 2011-10-30 LAB — HM COLONOSCOPY

## 2011-10-30 NOTE — Telephone Encounter (Signed)
Lipids, TSH,  Urine for protein,  CMEt added

## 2011-10-30 NOTE — Telephone Encounter (Signed)
Pt having labs on wed wanted to know if she could have her thyroid check also

## 2011-10-30 NOTE — Telephone Encounter (Signed)
Left detailed message notifying patient.

## 2011-11-01 ENCOUNTER — Ambulatory Visit: Payer: Managed Care, Other (non HMO) | Admitting: Internal Medicine

## 2011-11-01 ENCOUNTER — Other Ambulatory Visit (INDEPENDENT_AMBULATORY_CARE_PROVIDER_SITE_OTHER): Payer: Managed Care, Other (non HMO)

## 2011-11-01 DIAGNOSIS — E785 Hyperlipidemia, unspecified: Secondary | ICD-10-CM

## 2011-11-01 DIAGNOSIS — R002 Palpitations: Secondary | ICD-10-CM

## 2011-11-01 DIAGNOSIS — I10 Essential (primary) hypertension: Secondary | ICD-10-CM

## 2011-11-01 DIAGNOSIS — Z79899 Other long term (current) drug therapy: Secondary | ICD-10-CM

## 2011-11-01 LAB — MICROALBUMIN / CREATININE URINE RATIO
Creatinine,U: 271.1 mg/dL
Microalb Creat Ratio: 0.6 mg/g (ref 0.0–30.0)
Microalb, Ur: 1.6 mg/dL (ref 0.0–1.9)

## 2011-11-01 LAB — TSH: TSH: 0.69 u[IU]/mL (ref 0.35–5.50)

## 2011-11-02 LAB — LIPID PANEL
Cholesterol: 182 mg/dL (ref 0–200)
HDL: 50.8 mg/dL (ref >39.00)
LDL Cholesterol: 118 mg/dL — ABNORMAL HIGH (ref 0–99)
Total CHOL/HDL Ratio: 4
Triglycerides: 65 mg/dL (ref 0.0–149.0)
VLDL: 13 mg/dL (ref 0.0–40.0)

## 2011-11-02 LAB — COMPREHENSIVE METABOLIC PANEL
ALT: 17 U/L (ref 0–35)
AST: 18 U/L (ref 0–37)
Albumin: 4.1 g/dL (ref 3.5–5.2)
Alkaline Phosphatase: 54 U/L (ref 39–117)
BUN: 17 mg/dL (ref 6–23)
CO2: 20 mEq/L (ref 19–32)
Calcium: 9.4 mg/dL (ref 8.4–10.5)
Chloride: 111 mEq/L (ref 96–112)
Creatinine, Ser: 1.2 mg/dL (ref 0.4–1.2)
GFR: 50.39 mL/min — ABNORMAL LOW (ref >60.00)
Glucose, Bld: 82 mg/dL (ref 70–99)
Potassium: 4.2 mEq/L (ref 3.5–5.1)
Sodium: 144 mEq/L (ref 135–145)
Total Bilirubin: 0.5 mg/dL (ref 0.3–1.2)
Total Protein: 6.8 g/dL (ref 6.0–8.3)

## 2011-11-03 ENCOUNTER — Other Ambulatory Visit: Payer: Managed Care, Other (non HMO)

## 2011-11-03 ENCOUNTER — Ambulatory Visit
Admission: RE | Admit: 2011-11-03 | Discharge: 2011-11-03 | Disposition: A | Discharge status: Home / Self Care | Payer: Managed Care, Other (non HMO) | Source: Ambulatory Visit | Attending: Neurology | Admitting: Neurology

## 2011-11-03 DIAGNOSIS — M461 Sacroiliitis, not elsewhere classified: Secondary | ICD-10-CM

## 2011-11-03 MED ORDER — IOHEXOL 180 MG/ML  SOLN
1.0000 mL | Freq: Once | INTRAMUSCULAR | Status: AC | PRN
  Administered 2011-11-03: 1 mL via INTRA_ARTICULAR

## 2011-11-03 MED ORDER — METHYLPREDNISOLONE ACETATE 40 MG/ML INJ SUSP (RADIOLOG
120.0000 mg | Freq: Once | INTRAMUSCULAR | Status: AC
  Administered 2011-11-03: 120 mg via INTRA_ARTICULAR

## 2011-11-03 NOTE — Progress Notes (Signed)
Quick Note:  Attempt to call - VM- LMTCB if questions - labs normal ______ 

## 2011-11-06 ENCOUNTER — Ambulatory Visit (INDEPENDENT_AMBULATORY_CARE_PROVIDER_SITE_OTHER): Payer: Managed Care, Other (non HMO) | Admitting: Internal Medicine

## 2011-11-06 ENCOUNTER — Encounter: Payer: Self-pay | Admitting: Internal Medicine

## 2011-11-06 ENCOUNTER — Ambulatory Visit: Payer: Managed Care, Other (non HMO) | Admitting: Internal Medicine

## 2011-11-06 VITALS — BP 170/86 | HR 70 | Temp 98.1°F | Resp 16 | Wt 119.2 lb

## 2011-11-06 DIAGNOSIS — F410 Panic disorder [episodic paroxysmal anxiety] without agoraphobia: Secondary | ICD-10-CM | POA: Insufficient documentation

## 2011-11-06 DIAGNOSIS — I10 Essential (primary) hypertension: Secondary | ICD-10-CM

## 2011-11-06 DIAGNOSIS — R4589 Other symptoms and signs involving emotional state: Secondary | ICD-10-CM

## 2011-11-06 DIAGNOSIS — M48061 Spinal stenosis, lumbar region without neurogenic claudication: Secondary | ICD-10-CM

## 2011-11-06 MED ORDER — AMLODIPINE BESYLATE 2.5 MG PO TABS: 2.5000 mg | ORAL_TABLET | Freq: Every day | ORAL | Status: DC

## 2011-11-06 MED ORDER — ALPRAZOLAM 0.5 MG PO TABS: 0.5000 mg | ORAL_TABLET | Freq: Two times a day (BID) | ORAL | Status: DC | PRN

## 2011-11-06 NOTE — Assessment & Plan Note (Addendum)
Presumed, given prior cardiology evaluation with cardiac catheterization 2 years ago. I gave her the option of referral referral to cardiology for Holter monitor and ambulatory blood pressure monitor. She would like to try treatment for panic attacks first and if this does not help then to pursue the cardiology evaluation. Discussed the risks and benefits of alprazolam specifically related to addiction and sedation. She has been instructed to use it once or twice daily as needed. If she finds that she requires use of it on it on a twice daily basis we discussed changing her SSRI from wellbutrin to Zoloft.

## 2011-11-06 NOTE — Patient Instructions (Addendum)
Stop the lisinopril.,   Start the amlodipine for blood pressure,  Take at bedtime. Once daily  If yo do not tolerate it for any reason,  We can try the bystolic samples (one at bedtime)  Use the alprazolam as needed when an episode starts   Trial of flector patchfor your shoulder  .  If it helps,  Call for rx.   [facility] about [facility]

## 2011-11-06 NOTE — Assessment & Plan Note (Signed)
Secondary degenerative disease. Continue Vicodin as needed for pain control. She is averaging 4 tablets daily.

## 2011-11-06 NOTE — Progress Notes (Signed)
Patient ID: Adrienne Robinson, female   DOB: 01-20-1956, 56 y.o.   MRN: [id number]  Patient Active Problem List  Diagnosis  . Hypertension  . Depression  . Degenerative joint disease involving multiple joints  . Spinal stenosis of lumbar region  . Family history of colon cancer  . Ulcerative colitis with rectal bleeding  . Panic attack as reaction to stress    Subjective:  CC:   Chief Complaint  Patient presents with  . Hypertension  . Palpitations    HPI:   Adrienne Johnsonis a 56 y.o. female who presents for Followup on hypertension and palpitations. After the ER July 25th for symptoms including tingling of hands and dizziness in the setting of uncontrolled hypertension   (CT scan of head dione by EDP and was normal),  we increased her lisinopril to 5 mg daily for elevated blood pressure. She continues to have days where her blood pressure is in 117 systolic range. She also has days where her blood pressure is up in the 160s.  On Sept 6 after her husband's EGD and colonoscopy She had an episode of palpitations,  Chest pain described as a dull ache, accompanied by nausea dizziness and tingling in the arms and legs. No dyspnea. She then had a hot flash followed by cold chill. This occurred again last week. She has had cardiac catheterization within the last 2-3 years by Dr. Ihor Austin which was normal. She has a Resting heart rate in 60's and her recent TSH was normal but less than 1.0. Her home blood pressures have been labile.she does acknowledge a significant amount of emotional financial stress due to her husband's career change and her demanding work schedule. She wonders if she is having panic attacks.   She is status post TAH/BSOremotely and has not had post menopausal hot flashes until recently. 2) right-sided shoulder pain. She underwent a steroid injection in the shoulder by [facility] imaging recently. This has helped somewhat but she does wake up in the morning with pain and stiffness in the  right trapezius muscle and the right triceps/biceps. This is a dull ache that so far has not responded to steroid injection. She continues to require use of Vicodin for pain control.   Past Medical History  Diagnosis Date  . Hypertension   . Depression   . Degenerative joint disease involving multiple joints   . Spinal stenosis of lumbar region     Past Surgical History  Procedure Date  . Joint replacement     shoulder surgery  . Cesarean section   . Abdominal hysterectomy   . Oophorectomy          The following portions of the patient's history were reviewed and updated as appropriate: Allergies, current medications, and problem list.    Review of Systems:   12 Pt  review of systems was negative except those addressed in the HPI,     History   Social History  . Marital Status: Married    Spouse Name: N/A    Number of Children: N/A  . Years of Education: N/A   Occupational History  . Not on file.   Social History Main Topics  . Smoking status: Never Smoker   . Smokeless tobacco: Never Used  . Alcohol Use: 3.6 oz/week    6 Glasses of wine per week  . Drug Use: No  . Sexually Active: Not on file   Other Topics Concern  . Not on file   Social History Narrative  .  No narrative on file    Objective:  BP 170/86  Pulse 70  Temp 98.1 F (36.7 C) (Oral)  Resp 16  Wt 119 lb 4 oz (54.091 kg)  SpO2 97%  General appearance: alert, cooperative and appears stated age Ears: normal TM's and external ear canals both ears Throat: lips, mucosa, and tongue normal; teeth and gums normal Neck: no adenopathy, no carotid bruit, supple, symmetrical, trachea midline and thyroid not enlarged, symmetric, no tenderness/mass/nodules Back: symmetric, no curvature. ROM normal. No CVA tenderness. Lungs: clear to auscultation bilaterally Heart: regular rate and rhythm, S1, S2 normal, no murmur, click, rub or gallop Abdomen: soft, non-tender; bowel sounds normal; no masses,   no organomegaly Pulses: 2+ and symmetric Skin: Skin color, texture, turgor normal. No rashes or lesions Lymph nodes: Cervical, supraclavicular, and axillary nodes normal.  Assessment and Plan:  Panic attack as reaction to stress Presumed, given prior cardiology evaluation with cardiac catheterization 2 years ago. I gave her the option of referral referral to cardiology for Holter monitor and ambulatory blood pressure monitor. She would like to try treatment for panic attacks first and if this does not help then to pursue the cardiology evaluation. Discussed the risks and benefits of alprazolam specifically related to addiction and sedation. She has been instructed to use it once or twice daily as needed. If she finds that she requires use of it on it on a twice daily basis we discussed changing her SSRI from wellbutrin to Zoloft.  Spinal stenosis of lumbar region Secondary degenerative disease. Continue Vicodin as needed for pain control. She is averaging 4 tablets daily.  Hypertension We are stopping lisinopril in prescribing amlodipine to be taken once daily at bedtime. 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 She does not tolerate amlodipine I've given her samples of diastolic 5 mg daily. We did discuss this use of the beta blocker. Her pulse is in the 60s to 70s normally but diastolic does not have as much effect on heart rate is the other beta blockers.   Updated Medication List Outpatient Encounter Prescriptions as of 11/06/2011  Medication Sig Dispense Refill  . aspirin 81 MG tablet Take 81 mg by mouth daily.      Marland Kitchen buPROPion (WELLBUTRIN SR) 150 MG 12 hr tablet Take 1 tablet (150 mg total) by mouth daily.  30 tablet  3  . buPROPion (WELLBUTRIN XL) 300 MG 24 hr tablet Take 1 tablet (300 mg total) by mouth daily.  30 tablet  5  . fish oil-omega-3 fatty  acids 1000 MG capsule Take 2 g by mouth daily.      . fluticasone (FLONASE) 50 MCG/ACT nasal spray Place 2 sprays into the nose daily.  16 g  6  . HYDROcodone-acetaminophen (MAXIDONE) 10-750 MG per tablet Take 1 tablet by mouth every 6 (six) hours as needed for pain.  120 tablet  3  . mesalamine (CANASA) 1000 MG suppository Place 1,000 mg rectally at bedtime.      Marland Kitchen zolpidem (AMBIEN) 10 MG tablet Take 1 tablet (10 mg total) by mouth at bedtime as needed.  30 tablet  2  . DISCONTD: lisinopril (PRINIVIL,ZESTRIL) 5 MG tablet TAKE 1 TABLET BY MOUTH DAILY  90 tablet  3  . DISCONTD: lisinopril (PRINIVIL,ZESTRIL) 5 MG tablet       . ALPRAZolam (XANAX) 0.5 MG tablet Take  1 tablet (0.5 mg total) by mouth 2 (two) times daily as needed for sleep or anxiety.  60 tablet  3  . amLODipine (NORVASC) 2.5 MG tablet Take 1 tablet (2.5 mg total) by mouth daily.  90 tablet  3  . DISCONTD: buPROPion (WELLBUTRIN XL) 300 MG 24 hr tablet TAKE 1 TABLET BY MOUTH DAILY  30 tablet  6  . DISCONTD: Probiotic Product (PROBIOTIC FORMULA PO) Take by mouth. ultraspectrum  available at [facility]      . DISCONTD: vitamin B-12 (CYANOCOBALAMIN) 1000 MCG tablet Take 1,000 mcg by mouth daily.      Marland Kitchen DISCONTD: zolpidem (AMBIEN) 10 MG tablet Take 1 tablet (10 mg total) by mouth at bedtime as needed for sleep.  30 tablet  3     No orders of the defined types were placed in this encounter.    No Follow-up on file.

## 2011-11-06 NOTE — Assessment & Plan Note (Signed)
We are stopping lisinopril in prescribing amlodipine to be taken once daily at bedtime. 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 She does not tolerate amlodipine I've given her samples of diastolic 5 mg daily. We did discuss this use of the beta blocker. Her pulse is in the 60s to 70s normally but diastolic does not have as much effect on heart rate is the other beta blockers.

## 2011-11-07 ENCOUNTER — Encounter: Payer: Self-pay | Admitting: Internal Medicine

## 2011-11-07 ENCOUNTER — Telehealth: Payer: Self-pay | Admitting: Internal Medicine

## 2011-11-07 NOTE — Telephone Encounter (Signed)
[facility] and asked why patient was only getting 80 tablets of the hydrocodone-APAP instead of the 120 that was supposed to be prescribed.  The pharmacist stated when she came to get her Rx they pharmacy was out of stock and only had 80 tablets so they asked her was it ok for her to have that amount and then she could come back for the rest.   Now when she goes back to get any refills the count is off because of the first Rx.

## 2011-11-07 NOTE — Telephone Encounter (Signed)
[facility] told [facility] that [facility] told [facility] couldn't have the other 40 until her next prescription is due.  She should be able to get the additional th whenever 40 that she is over by them during that 30 day. Not at the end of it .  If there in may be detected not asked the patient is supposed to be getting 120 tablets per month.

## 2011-11-08 ENCOUNTER — Encounter: Payer: Self-pay | Admitting: Internal Medicine

## 2011-11-08 DIAGNOSIS — R002 Palpitations: Secondary | ICD-10-CM

## 2011-11-08 DIAGNOSIS — Z800 Family history of malignant neoplasm of digestive organs: Secondary | ICD-10-CM

## 2011-11-08 DIAGNOSIS — K5190 Ulcerative colitis, unspecified, without complications: Secondary | ICD-10-CM

## 2011-11-09 ENCOUNTER — Encounter: Payer: Self-pay | Admitting: Internal Medicine

## 2011-11-10 ENCOUNTER — Other Ambulatory Visit: Payer: Managed Care, Other (non HMO)

## 2011-11-10 ENCOUNTER — Other Ambulatory Visit: Payer: Self-pay | Admitting: Internal Medicine

## 2011-11-10 DIAGNOSIS — R002 Palpitations: Secondary | ICD-10-CM

## 2011-11-10 MED ORDER — ESTRADIOL 0.5 MG PO TABS: 0.5000 mg | ORAL_TABLET | Freq: Every day | ORAL | Status: DC

## 2011-11-16 ENCOUNTER — Ambulatory Visit: Payer: Managed Care, Other (non HMO) | Admitting: Cardiovascular Disease

## 2011-11-20 ENCOUNTER — Other Ambulatory Visit: Payer: Self-pay | Admitting: Internal Medicine

## 2011-11-20 ENCOUNTER — Encounter: Payer: Self-pay | Admitting: Internal Medicine

## 2011-11-20 DIAGNOSIS — R002 Palpitations: Secondary | ICD-10-CM

## 2011-11-22 ENCOUNTER — Encounter: Payer: Self-pay | Admitting: Internal Medicine

## 2011-11-24 ENCOUNTER — Other Ambulatory Visit (INDEPENDENT_AMBULATORY_CARE_PROVIDER_SITE_OTHER): Payer: Managed Care, Other (non HMO)

## 2011-11-24 DIAGNOSIS — R002 Palpitations: Secondary | ICD-10-CM

## 2011-11-24 LAB — T3, FREE: T3, Free: 2.6 pg/mL (ref 2.3–4.2)

## 2011-11-24 LAB — T4, FREE: Free T4: 0.73 ng/dL (ref 0.60–1.60)

## 2011-11-25 ENCOUNTER — Encounter: Payer: Self-pay | Admitting: Internal Medicine

## 2011-11-27 ENCOUNTER — Encounter: Payer: Self-pay | Admitting: Internal Medicine

## 2011-11-27 DIAGNOSIS — R5383 Other fatigue: Secondary | ICD-10-CM

## 2011-11-27 DIAGNOSIS — R002 Palpitations: Secondary | ICD-10-CM

## 2011-11-29 ENCOUNTER — Telehealth: Payer: Self-pay | Admitting: Internal Medicine

## 2011-11-29 LAB — HM MAMMOGRAPHY: HM Mammogram: NORMAL

## 2011-11-29 NOTE — Telephone Encounter (Signed)
Pt calling to check on referral appointment with dr Renae Fickle Pt will be out of town all next week. Would like to know before she goes out of town when the appointment is

## 2011-12-11 ENCOUNTER — Other Ambulatory Visit: Payer: Self-pay | Admitting: Internal Medicine

## 2011-12-11 NOTE — Telephone Encounter (Signed)
The hydrocodone refill is 10 days too early.  It cannot be refilled until nov 2 , the ambien refill is authroized in [facility]

## 2011-12-11 NOTE — Telephone Encounter (Signed)
ambien 10 mg #30 2 R. [facility]

## 2011-12-12 ENCOUNTER — Encounter: Payer: Self-pay | Admitting: Internal Medicine

## 2011-12-19 ENCOUNTER — Other Ambulatory Visit: Payer: Self-pay

## 2011-12-19 ENCOUNTER — Encounter: Payer: Self-pay | Admitting: Internal Medicine

## 2011-12-19 MED ORDER — HYDROCODONE-ACETAMINOPHEN 10-750 MG PO TABS: 1.0000 | ORAL_TABLET | Freq: Three times a day (TID) | ORAL | Status: DC | PRN

## 2011-12-19 NOTE — Telephone Encounter (Signed)
Rx uncomfortable. Please fax to pharmacy.

## 2011-12-20 ENCOUNTER — Encounter: Payer: Self-pay | Admitting: Internal Medicine

## 2011-12-20 ENCOUNTER — Other Ambulatory Visit: Payer: Self-pay | Admitting: Internal Medicine

## 2011-12-21 ENCOUNTER — Other Ambulatory Visit: Payer: Self-pay

## 2011-12-28 ENCOUNTER — Encounter: Payer: Self-pay | Admitting: Internal Medicine

## 2012-01-05 ENCOUNTER — Encounter: Payer: Self-pay | Admitting: Internal Medicine

## 2012-01-05 DIAGNOSIS — M549 Dorsalgia, unspecified: Secondary | ICD-10-CM

## 2012-01-05 DIAGNOSIS — G8929 Other chronic pain: Secondary | ICD-10-CM

## 2012-01-07 MED ORDER — OXYCODONE HCL ER 10 MG PO T12A: 10.0000 mg | EXTENDED_RELEASE_TABLET | Freq: Every evening | ORAL | Status: DC | PRN

## 2012-01-08 ENCOUNTER — Encounter: Payer: Self-pay | Admitting: Internal Medicine

## 2012-01-09 ENCOUNTER — Telehealth: Payer: Self-pay | Admitting: Internal Medicine

## 2012-01-09 NOTE — Telephone Encounter (Signed)
Please call pt when rx for percet is ready to be picked up

## 2012-01-10 ENCOUNTER — Telehealth: Payer: Self-pay

## 2012-01-10 DIAGNOSIS — G8929 Other chronic pain: Secondary | ICD-10-CM

## 2012-01-10 MED ORDER — OXYCODONE HCL ER 10 MG PO T12A: 10.0000 mg | EXTENDED_RELEASE_TABLET | Freq: Every evening | ORAL | Status: DC | PRN

## 2012-01-10 NOTE — Telephone Encounter (Signed)
Patient is requesting  Rx for Percocet.

## 2012-01-10 NOTE — Telephone Encounter (Signed)
That is not what we discussed.  We discussed oxycontin to take every 12 hours ,  It has no tylenol in it. rx on printer. Needs signing .

## 2012-01-10 NOTE — Telephone Encounter (Signed)
Pt called back wanting to pick 11/21 around 10 please advise if this is ok Can leave message on phone [phone]

## 2012-01-11 ENCOUNTER — Encounter: Payer: Self-pay | Admitting: Internal Medicine

## 2012-01-11 ENCOUNTER — Other Ambulatory Visit: Payer: Self-pay

## 2012-01-11 NOTE — Telephone Encounter (Signed)
Patient notified Rx for Oxycodone 10 mg # 60 0 R sent electronic to [phone]

## 2012-01-15 ENCOUNTER — Encounter: Payer: Self-pay | Admitting: Internal Medicine

## 2012-01-17 ENCOUNTER — Encounter: Payer: Self-pay | Admitting: Internal Medicine

## 2012-01-21 DIAGNOSIS — Q282 Arteriovenous malformation of cerebral vessels: Secondary | ICD-10-CM

## 2012-01-21 HISTORY — DX: Arteriovenous malformation of cerebral vessels: Q28.2

## 2012-01-22 ENCOUNTER — Encounter: Payer: Self-pay | Admitting: Internal Medicine

## 2012-01-22 ENCOUNTER — Ambulatory Visit (INDEPENDENT_AMBULATORY_CARE_PROVIDER_SITE_OTHER): Payer: Managed Care, Other (non HMO) | Admitting: Internal Medicine

## 2012-01-22 VITALS — BP 170/100 | HR 69 | Temp 97.3°F | Ht 65.0 in | Wt 126.8 lb

## 2012-01-22 DIAGNOSIS — Z23 Encounter for immunization: Secondary | ICD-10-CM

## 2012-01-22 DIAGNOSIS — R5381 Other malaise: Secondary | ICD-10-CM

## 2012-01-22 DIAGNOSIS — F410 Panic disorder [episodic paroxysmal anxiety] without agoraphobia: Secondary | ICD-10-CM

## 2012-01-22 DIAGNOSIS — Z1331 Encounter for screening for depression: Secondary | ICD-10-CM

## 2012-01-22 DIAGNOSIS — R5383 Other fatigue: Secondary | ICD-10-CM

## 2012-01-22 DIAGNOSIS — R4589 Other symptoms and signs involving emotional state: Secondary | ICD-10-CM

## 2012-01-22 DIAGNOSIS — I10 Essential (primary) hypertension: Secondary | ICD-10-CM

## 2012-01-22 DIAGNOSIS — R002 Palpitations: Secondary | ICD-10-CM

## 2012-01-22 LAB — CBC WITH DIFFERENTIAL/PLATELET
Basophils Absolute: 0 10*3/uL (ref 0.0–0.1)
Basophils Relative: 1 % (ref 0–1)
Eosinophils Absolute: 0 10*3/uL (ref 0.0–0.7)
Eosinophils Relative: 1 % (ref 0–5)
HCT: 33.6 % — ABNORMAL LOW (ref 36.0–46.0)
Hemoglobin: 11.5 g/dL — ABNORMAL LOW (ref 12.0–15.0)
Lymphocytes Relative: 40 % (ref 12–46)
Lymphs Abs: 1.2 10*3/uL (ref 0.7–4.0)
MCH: 31.9 pg (ref 26.0–34.0)
MCHC: 34.2 g/dL (ref 30.0–36.0)
MCV: 93.1 fL (ref 78.0–100.0)
Monocytes Absolute: 0.4 10*3/uL (ref 0.1–1.0)
Monocytes Relative: 14 % — ABNORMAL HIGH (ref 3–12)
Neutro Abs: 1.3 10*3/uL — ABNORMAL LOW (ref 1.7–7.7)
Neutrophils Relative %: 44 % (ref 43–77)
Platelets: 295 10*3/uL (ref 150–400)
RBC: 3.61 MIL/uL — ABNORMAL LOW (ref 3.87–5.11)
RDW: 13.6 % (ref 11.5–15.5)
WBC: 3 10*3/uL — ABNORMAL LOW (ref 4.0–10.5)

## 2012-01-22 LAB — MAGNESIUM: Magnesium: 2.1 mg/dL (ref 1.5–2.5)

## 2012-01-22 LAB — VITAMIN B12: Vitamin B-12: 498 pg/mL (ref 211–911)

## 2012-01-22 MED ORDER — PAROXETINE HCL ER 12.5 MG PO TB24: 12.5000 mg | ORAL_TABLET | ORAL | Status: DC

## 2012-01-22 MED ORDER — AMLODIPINE BESYLATE 5 MG PO TABS: 5.0000 mg | ORAL_TABLET | Freq: Every day | ORAL | Status: DC

## 2012-01-22 MED ORDER — BUPROPION HCL ER (SR) 150 MG PO TB12: 150.0000 mg | ORAL_TABLET | Freq: Every day | ORAL | Status: DC

## 2012-01-22 NOTE — Assessment & Plan Note (Signed)
Her current episodes of panic may be aggravated by wellbutrin.  She has already reduced her dose to 300 mg daily.  Will decreased to 150 mg daily for two weeks, then every other day for one week, then stop .  Will start Paxil CR at 12. 5 mg daily, increase to 25 mg in 2 weeks.

## 2012-01-22 NOTE — Assessment & Plan Note (Signed)
Increase amlodipine to 5mg daily

## 2012-01-22 NOTE — Patient Instructions (Addendum)
Resume flonase twice daily to see if the sinuses and headache improve.  If no change in 2 weeks and eye exam normal.  MRI   Continue wellbutrin taper and start Paxil CR .  150 mg once daily in the morning for 2 weeks, then every other day for 2 weeks,  Then stop .  Start the Paxil at one tablet daily,  Increase to 2 in 2 weeks.     Start the [place]  Transdermal estrogen .  One patch lasts 3 to 4 days .   Increase the amlodipine to 5 mg daily for control of hypertension  You can increase the propanolol dose to 20 mg as needed for palpitation  Return in one month

## 2012-01-22 NOTE — Assessment & Plan Note (Addendum)
With negative endocrine and cardiology evaluation, we discussed the probability of menopausal syndrome.  Recommend trial of transdermal estrogen,  0.1 mg  Samples given. Increase propanolol to 20 mg bid prn

## 2012-01-22 NOTE — Progress Notes (Signed)
Patient ID: Adrienne Robinson, female   DOB: November 17, 1955, 56 y.o.   MRN: [id number]  Patient Active Problem List  Diagnosis  . Hypertension  . Degenerative joint disease involving multiple joints  . Spinal stenosis of lumbar region  . Family history of colon cancer  . Ulcerative colitis with rectal bleeding  . Panic attack as reaction to stress  . Palpitations  . Fatigue    Subjective:  CC:   Chief Complaint  Patient presents with  . Follow-up    HPI:   Adrienne Robinson a 56 y.o. female who presents Follow up on multiple symptoms including recurrent chest pain, palpitaitons, hands tingling . Endocrine and cardiology evaluation unrevealing,  Her new issue is  severe headache occurring daily behind both eyes,  And her sinuses feeling like they are burning and feeling congested.  She has an appt with [place] [facility] physiatrist  In the near future.  She feels miserable,  Feels anxious continually.  She has had a prior  Trial of cymbalta by Dr. Sandria Manly which caused excessive fatigu.  Pior trial of transdermal ketamine did not alleviate her chronic pain.     Past Medical History  Diagnosis Date  . Hypertension   . Depression   . Degenerative joint disease involving multiple joints   . Spinal stenosis of lumbar region     Past Surgical History  Procedure Date  . Abdominal hysterectomy   . Oophorectomy   . Shoulder arthroscopy with rotator cuff repair and subacromial decompression 2007    left    The following portions of the patient's history were reviewed and updated as appropriate: Allergies, current medications, and problem list.   Review of Systems:   Patient denies fevers, malaise,  skin rash, eye pain, sinus congestion and sinus pain, sore throat, dysphagia,  hemoptysis , cough, dyspnea, wheezing, orthopnea, edema, abdominal pain, nausea, melena, diarrhea, constipation, flank pain, dysuria, hematuria, urinary  Frequency, nocturia, numbness, seizures,  Focal weakness, Loss  of consciousness,  Tremor, insomnia,  anxiety, and suicidal ideation.         History   Social History  . Marital Status: Married    Spouse Name: N/A    Number of Children: N/A  . Years of Education: N/A   Occupational History  . Not on file.   Social History Main Topics  . Smoking status: Never Smoker   . Smokeless tobacco: Never Used  . Alcohol Use: 3.6 oz/week    6 Glasses of wine per week  . Drug Use: No  . Sexually Active: Not on file   Other Topics Concern  . Not on file   Social History Narrative  . No narrative on file    Objective:  BP 170/100  Pulse 69  Temp 97.3 F (36.3 C) (Oral)  Ht 5\' 5"  (1.651 m)  Wt 126 lb 12 oz (57.493 kg)  BMI 21.09 kg/m2  SpO2 99%  General appearance: alert, cooperative and appears stated age Ears: normal TM's and external ear canals both ears Throat: lips, mucosa, and tongue normal; teeth and gums normal Neck: no adenopathy, no carotid bruit, supple, symmetrical, trachea midline and thyroid not enlarged, symmetric, no tenderness/mass/nodules Back: symmetric, no curvature. ROM normal. No CVA tenderness. Lungs: clear to auscultation bilaterally Heart: regular rate and rhythm, S1, S2 normal, no murmur, click, rub or gallop Abdomen: soft, non-tender; bowel sounds normal; no masses,  no organomegaly Pulses: 2+ and symmetric Skin: Skin color, texture, turgor normal. No rashes or lesions Lymph nodes: Cervical, supraclavicular,  and axillary nodes normal.  Assessment and Plan:  Panic attack as reaction to stress Her current episodes of panic may be aggravated by wellbutrin.  She has already reduced her dose to 300 mg daily.  Will decreased to 150 mg daily for two weeks, then every other day for one week, then stop .  Will start Paxil CR at 12. 5 mg daily, increase to 25 mg in 2 weeks.   Palpitations With negative endocrine and cardiology evaluation, we discussed the probability of menopausal syndrome.  Recommend trial of  transdermal estrogen,  0.1 mg  Samples given. Increase propanolol to 20 mg bid prn   Hypertension Increase amlodipine to 5 mg daily   Updated Medication List Outpatient Encounter Prescriptions as of 01/22/2012  Medication Sig Dispense Refill  . amLODipine (NORVASC) 5 MG tablet Take 1 tablet (5 mg total) by mouth daily.  90 tablet  3  . aspirin 81 MG tablet Take 81 mg by mouth daily.      Marland Kitchen buPROPion (WELLBUTRIN SR) 150 MG 12 hr tablet Take 1 tablet (150 mg total) by mouth daily.  30 tablet  0  . diazepam (VALIUM) 5 MG tablet       . estradiol (ESTRACE) 0.5 MG tablet Take 1 tablet (0.5 mg total) by mouth daily.  30 tablet  3  . fish oil-omega-3 fatty acids 1000 MG capsule Take 2 g by mouth daily.      . fluticasone (FLONASE) 50 MCG/ACT nasal spray USE 2 SPRAYS IN EACH NOSTRIL DAILY AS NEEDED  16 g  11  . HYDROcodone-acetaminophen (MAXIDONE) 10-750 MG per tablet Take 1 tablet by mouth every 8 (eight) hours as needed for pain.  90 tablet  3  . lisinopril (PRINIVIL,ZESTRIL) 5 MG tablet       . mesalamine (CANASA) 1000 MG suppository Place 1,000 mg rectally at bedtime.      . methocarbamol (ROBAXIN) 750 MG tablet       . OxyCODONE (OXYCONTIN) 10 mg TB12 Take 1 tablet (10 mg total) by mouth at bedtime and may repeat dose one time if needed.  60 tablet  0  . zolpidem (AMBIEN) 10 MG tablet TAKE 1 TABLET BY MOUTH AT BEDTIME AS NEEDED  30 tablet  2  . [DISCONTINUED] amLODipine (NORVASC) 2.5 MG tablet Take 1 tablet (2.5 mg total) by mouth daily.  90 tablet  3  . [DISCONTINUED] buPROPion (WELLBUTRIN SR) 150 MG 12 hr tablet Take 1 tablet (150 mg total) by mouth daily.  30 tablet  3  . [DISCONTINUED] buPROPion (WELLBUTRIN XL) 300 MG 24 hr tablet Take 1 tablet (300 mg total) by mouth daily.  30 tablet  5  . ALPRAZolam (XANAX) 0.5 MG tablet       . PARoxetine (PAXIL CR) 12.5 MG 24 hr tablet Take 1 tablet (12.5 mg total) by mouth every morning.  60 tablet  0     Orders Placed This Encounter  Procedures    . Tdap vaccine greater than or equal to 7yo IM  . Magnesium  . CBC with Differential  . Vitamin B12  . Folate RBC  . CBC with Differential    No Follow-up on file.

## 2012-01-23 ENCOUNTER — Encounter: Payer: Self-pay | Admitting: Internal Medicine

## 2012-01-23 DIAGNOSIS — R519 Headache, unspecified: Secondary | ICD-10-CM

## 2012-01-23 DIAGNOSIS — H539 Unspecified visual disturbance: Secondary | ICD-10-CM

## 2012-01-23 LAB — FOLATE RBC

## 2012-01-24 ENCOUNTER — Encounter: Payer: Self-pay | Admitting: Internal Medicine

## 2012-01-24 ENCOUNTER — Ambulatory Visit: Payer: Managed Care, Other (non HMO)

## 2012-01-24 DIAGNOSIS — R519 Headache, unspecified: Secondary | ICD-10-CM

## 2012-01-24 DIAGNOSIS — D709 Neutropenia, unspecified: Secondary | ICD-10-CM

## 2012-01-25 ENCOUNTER — Other Ambulatory Visit: Payer: Self-pay

## 2012-01-25 DIAGNOSIS — R5381 Other malaise: Secondary | ICD-10-CM

## 2012-01-26 ENCOUNTER — Other Ambulatory Visit (INDEPENDENT_AMBULATORY_CARE_PROVIDER_SITE_OTHER): Payer: Managed Care, Other (non HMO)

## 2012-01-26 ENCOUNTER — Ambulatory Visit (INDEPENDENT_AMBULATORY_CARE_PROVIDER_SITE_OTHER): Payer: Managed Care, Other (non HMO) | Admitting: Internal Medicine

## 2012-01-26 ENCOUNTER — Ambulatory Visit: Payer: Managed Care, Other (non HMO)

## 2012-01-26 DIAGNOSIS — Z23 Encounter for immunization: Secondary | ICD-10-CM

## 2012-01-26 DIAGNOSIS — R5383 Other fatigue: Secondary | ICD-10-CM

## 2012-01-26 DIAGNOSIS — R5381 Other malaise: Secondary | ICD-10-CM

## 2012-01-26 LAB — CREATININE, SERUM: Creatinine, Ser: 0.9 mg/dL (ref 0.4–1.2)

## 2012-01-26 NOTE — Addendum Note (Signed)
Addended byWilley Blade on: 01/26/2012 08:59 AM   Modules accepted: Orders

## 2012-01-28 DIAGNOSIS — Z23 Encounter for immunization: Secondary | ICD-10-CM | POA: Insufficient documentation

## 2012-01-28 NOTE — Progress Notes (Signed)
Patient ID: Adrienne Robinson, female   DOB: 17-Nov-1955, 56 y.o.   MRN: [id number] Patient is here for an influenza vaccine.

## 2012-01-29 LAB — FOLATE RBC: RBC Folate: 688 ng/mL (ref >=366)

## 2012-01-31 ENCOUNTER — Encounter: Payer: Self-pay | Admitting: Internal Medicine

## 2012-01-31 ENCOUNTER — Telehealth: Payer: Self-pay | Admitting: Internal Medicine

## 2012-01-31 DIAGNOSIS — Q282 Arteriovenous malformation of cerebral vessels: Secondary | ICD-10-CM | POA: Insufficient documentation

## 2012-01-31 NOTE — Telephone Encounter (Signed)
results of MRI emailed to patient with referrals in process

## 2012-02-02 ENCOUNTER — Other Ambulatory Visit (HOSPITAL_COMMUNITY): Payer: Self-pay | Admitting: Neurology

## 2012-02-02 ENCOUNTER — Encounter: Payer: Self-pay | Admitting: Internal Medicine

## 2012-02-02 DIAGNOSIS — Q2730 Arteriovenous malformation, site unspecified: Secondary | ICD-10-CM

## 2012-02-05 ENCOUNTER — Other Ambulatory Visit (HOSPITAL_COMMUNITY): Payer: Self-pay | Admitting: Interventional Radiology

## 2012-02-05 ENCOUNTER — Other Ambulatory Visit (HOSPITAL_COMMUNITY): Payer: Self-pay | Admitting: Neurology

## 2012-02-05 ENCOUNTER — Ambulatory Visit (HOSPITAL_COMMUNITY)
Admission: RE | Admit: 2012-02-05 | Discharge: 2012-02-05 | Disposition: A | Discharge status: Home / Self Care | Payer: Managed Care, Other (non HMO) | Source: Ambulatory Visit | Attending: Neurology | Admitting: Neurology

## 2012-02-05 DIAGNOSIS — Q2730 Arteriovenous malformation, site unspecified: Secondary | ICD-10-CM

## 2012-02-06 ENCOUNTER — Encounter: Payer: Self-pay | Admitting: Internal Medicine

## 2012-02-07 ENCOUNTER — Other Ambulatory Visit: Payer: Self-pay

## 2012-02-07 ENCOUNTER — Other Ambulatory Visit: Payer: Self-pay | Admitting: Physician Assistant

## 2012-02-07 ENCOUNTER — Ambulatory Visit: Payer: Self-pay | Admitting: Oncology

## 2012-02-07 ENCOUNTER — Other Ambulatory Visit: Payer: Self-pay | Admitting: Radiology

## 2012-02-07 ENCOUNTER — Encounter (HOSPITAL_COMMUNITY): Payer: Self-pay | Admitting: Respiratory Therapy

## 2012-02-07 LAB — CBC CANCER CENTER
Basophil #: 0 x10 3/mm (ref 0.0–0.1)
Basophil %: 0.7 %
Eosinophil #: 0 x10 3/mm (ref 0.0–0.7)
Eosinophil %: 1.1 %
HCT: 35.2 % (ref 35.0–47.0)
HGB: 12.1 g/dL (ref 12.0–16.0)
Lymphocyte #: 1.1 x10 3/mm (ref 1.0–3.6)
Lymphocyte %: 31 %
MCH: 32.1 pg (ref 26.0–34.0)
MCHC: 34.4 g/dL (ref 32.0–36.0)
MCV: 93 fL (ref 80–100)
Monocyte #: 0.3 x10 3/mm (ref 0.2–0.9)
Monocyte %: 8.2 %
Neutrophil #: 2 x10 3/mm (ref 1.4–6.5)
Neutrophil %: 59 %
Platelet: 279 x10 3/mm (ref 150–440)
RBC: 3.77 10*6/uL — ABNORMAL LOW (ref 3.80–5.20)
RDW: 12.5 % (ref 11.5–14.5)
WBC: 3.5 x10 3/mm — ABNORMAL LOW (ref 3.6–11.0)

## 2012-02-07 LAB — LACTATE DEHYDROGENASE: LDH: 163 U/L (ref 81–246)

## 2012-02-07 LAB — IRON AND TIBC
Iron Bind.Cap.(Total): 382 ug/dL (ref 250–450)
Iron Saturation: 20 %
Iron: 76 ug/dL (ref 50–170)
Unbound Iron-Bind.Cap.: 306 ug/dL

## 2012-02-07 LAB — FERRITIN: Ferritin (ARMC): 49 ng/mL (ref 8–388)

## 2012-02-07 MED ORDER — HYDROCODONE-ACETAMINOPHEN 7.5-500 MG PO TABS: 1.0000 | ORAL_TABLET | Freq: Four times a day (QID) | ORAL | Status: DC | PRN

## 2012-02-07 NOTE — Telephone Encounter (Signed)
[name],  Please call in a new hydrocodone to prescription for Adrienne Robinson  750/500 dose   #120/month 2 refills rx on printer

## 2012-02-08 LAB — PROT IMMUNOELECTROPHORES(ARMC)

## 2012-02-09 ENCOUNTER — Ambulatory Visit (HOSPITAL_COMMUNITY)
Admission: RE | Admit: 2012-02-09 | Discharge: 2012-02-09 | Disposition: A | Discharge status: Home / Self Care | Payer: Managed Care, Other (non HMO) | Source: Ambulatory Visit | Attending: Neurology | Admitting: Neurology

## 2012-02-09 ENCOUNTER — Encounter (HOSPITAL_COMMUNITY): Payer: Self-pay

## 2012-02-09 ENCOUNTER — Ambulatory Visit (HOSPITAL_COMMUNITY): Admission: RE | Admit: 2012-02-09 | Payer: Managed Care, Other (non HMO) | Source: Ambulatory Visit

## 2012-02-09 ENCOUNTER — Other Ambulatory Visit (HOSPITAL_COMMUNITY): Payer: Self-pay | Admitting: Neurology

## 2012-02-09 VITALS — BP 90/59 | HR 70 | Temp 98.1°F | Resp 16 | Ht 65.0 in | Wt 125.0 lb

## 2012-02-09 DIAGNOSIS — I10 Essential (primary) hypertension: Secondary | ICD-10-CM | POA: Insufficient documentation

## 2012-02-09 DIAGNOSIS — Q2730 Arteriovenous malformation, site unspecified: Secondary | ICD-10-CM

## 2012-02-09 DIAGNOSIS — I671 Cerebral aneurysm, nonruptured: Secondary | ICD-10-CM | POA: Insufficient documentation

## 2012-02-09 DIAGNOSIS — H538 Other visual disturbances: Secondary | ICD-10-CM | POA: Insufficient documentation

## 2012-02-09 DIAGNOSIS — R51 Headache: Secondary | ICD-10-CM | POA: Insufficient documentation

## 2012-02-09 LAB — BASIC METABOLIC PANEL
BUN: 19 mg/dL (ref 6–23)
CO2: 22 mEq/L (ref 19–32)
Calcium: 9.4 mg/dL (ref 8.4–10.5)
Chloride: 104 mEq/L (ref 96–112)
Creatinine, Ser: 0.91 mg/dL (ref 0.50–1.10)
GFR calc Af Amer: 80 mL/min — ABNORMAL LOW (ref >90)
GFR calc non Af Amer: 69 mL/min — ABNORMAL LOW (ref >90)
Glucose, Bld: 115 mg/dL — ABNORMAL HIGH (ref 70–99)
Potassium: 4.1 mEq/L (ref 3.5–5.1)
Sodium: 139 mEq/L (ref 135–145)

## 2012-02-09 LAB — CBC WITH DIFFERENTIAL/PLATELET
Basophils Absolute: 0 10*3/uL (ref 0.0–0.1)
Basophils Relative: 1 % (ref 0–1)
Eosinophils Absolute: 0 10*3/uL (ref 0.0–0.7)
Eosinophils Relative: 1 % (ref 0–5)
HCT: 34.4 % — ABNORMAL LOW (ref 36.0–46.0)
Hemoglobin: 11.7 g/dL — ABNORMAL LOW (ref 12.0–15.0)
Lymphocytes Relative: 26 % (ref 12–46)
Lymphs Abs: 1 10*3/uL (ref 0.7–4.0)
MCH: 31.6 pg (ref 26.0–34.0)
MCHC: 34 g/dL (ref 30.0–36.0)
MCV: 93 fL (ref 78.0–100.0)
Monocytes Absolute: 0.4 10*3/uL (ref 0.1–1.0)
Monocytes Relative: 9 % (ref 3–12)
Neutro Abs: 2.5 10*3/uL (ref 1.7–7.7)
Neutrophils Relative %: 64 % (ref 43–77)
Platelets: 254 10*3/uL (ref 150–400)
RBC: 3.7 MIL/uL — ABNORMAL LOW (ref 3.87–5.11)
RDW: 12.1 % (ref 11.5–15.5)
WBC: 3.9 10*3/uL — ABNORMAL LOW (ref 4.0–10.5)

## 2012-02-09 LAB — PROTIME-INR
INR: 0.93 (ref 0.00–1.49)
Prothrombin Time: 12.4 seconds (ref 11.6–15.2)

## 2012-02-09 LAB — APTT: aPTT: 37 seconds (ref 24–37)

## 2012-02-09 MED ORDER — MIDAZOLAM HCL 2 MG/2ML IJ SOLN
INTRAMUSCULAR | Status: AC
  Filled 2012-02-09: qty 4

## 2012-02-09 MED ORDER — MIDAZOLAM HCL 2 MG/2ML IJ SOLN
INTRAMUSCULAR | Status: AC | PRN
  Administered 2012-02-09 (×2): 1 mg via INTRAVENOUS

## 2012-02-09 MED ORDER — SODIUM CHLORIDE 0.9 % IV SOLN
INTRAVENOUS | Status: DC
  Administered 2012-02-09: 10:00:00 via INTRAVENOUS

## 2012-02-09 MED ORDER — FENTANYL CITRATE 0.05 MG/ML IJ SOLN
INTRAMUSCULAR | Status: AC | PRN
  Administered 2012-02-09 (×3): 25 ug via INTRAVENOUS

## 2012-02-09 MED ORDER — SODIUM CHLORIDE 0.9 % IV SOLN: INTRAVENOUS | Status: AC

## 2012-02-09 MED ORDER — HYDROCODONE-ACETAMINOPHEN 5-325 MG PO TABS: 1.0000 | ORAL_TABLET | Freq: Once | ORAL | Status: DC

## 2012-02-09 MED ORDER — IOHEXOL 300 MG/ML  SOLN
200.0000 mL | Freq: Once | INTRAMUSCULAR | Status: AC | PRN
  Administered 2012-02-09: 70 mL via INTRA_ARTERIAL

## 2012-02-09 MED ORDER — FENTANYL CITRATE 0.05 MG/ML IJ SOLN
INTRAMUSCULAR | Status: AC
  Filled 2012-02-09: qty 2

## 2012-02-09 MED ORDER — HYDROCODONE-ACETAMINOPHEN 5-325 MG PO TABS
ORAL_TABLET | ORAL | Status: AC
  Filled 2012-02-09: qty 2

## 2012-02-09 MED ORDER — HEPARIN SOD (PORK) LOCK FLUSH 100 UNIT/ML IV SOLN
INTRAVENOUS | Status: AC | PRN
  Administered 2012-02-09: 500 [IU] via INTRAVENOUS

## 2012-02-09 NOTE — ED Notes (Signed)
Bilateral pedal pulses 2+

## 2012-02-09 NOTE — Procedures (Signed)
S/P 4 vessel cerebral arteriogram  RT CFA approach.  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 Associated 7mm venous varix . Single cortical vein draining into posterior superior sagittal sinus.

## 2012-02-09 NOTE — ED Notes (Signed)
Requested bed from short stay, spoke with Liborio Nixon RN

## 2012-02-09 NOTE — ED Notes (Signed)
Vicodin 10mg  PO given for H/A 5-6/10. Will continue to monitor

## 2012-02-09 NOTE — H&P (Signed)
Adrienne Robinson is an 56 y.o. female.   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 Hypertension   . Depression   . Degenerative joint disease involving multiple joints   . Spinal stenosis of lumbar region     Past Surgical History  Procedure Date  . Abdominal hysterectomy   . Oophorectomy   . Shoulder arthroscopy with rotator cuff repair and subacromial decompression 2007    left    Family History  Problem Relation Age of Onset  . Cancer Father 53    colon   Social History:  reports that she has never smoked. She has never used smokeless tobacco. She reports that she drinks about 3.6 ounces of alcohol per week. She reports that she does not use illicit drugs.  Allergies:  Allergies  Allergen Reactions  . Adhesive (Tape) Rash and Other (See Comments)    reddness     (Not in a hospital admission)  No results found for this or any previous visit (from the past 48 hour(s)). No results found.  Review of Systems  Constitutional: Negative for fever and weight loss.  HENT: Positive for neck pain.   Eyes: Positive for blurred vision.  Respiratory: Negative for shortness of breath.   Cardiovascular: Negative for chest pain.  Gastrointestinal: Negative for nausea and vomiting.  Neurological: Positive for dizziness and headaches. Negative for weakness.    Blood pressure 139/78, pulse 69, temperature 98.1 F (36.7 C), temperature source Oral, resp. rate 18, height 5\' 5"  (1.651 m), weight 125 lb (56.7 kg), SpO2 98.00%. Physical Exam  Constitutional: She is oriented to person, place, and time. She appears well-developed and well-nourished.  Cardiovascular: Normal rate, regular rhythm and normal heart sounds.   No murmur heard. Respiratory: Effort normal and breath  sounds normal. She has no wheezes.  GI: Soft. Bowel sounds are normal. There is no tenderness.  Musculoskeletal: Normal range of motion.  Neurological: She is alert and oriented to person, place, and time.  Skin: Skin is warm and dry.  Psychiatric: She has a normal mood and affect. Her behavior is normal. Judgment and thought content normal.     Assessment/Plan Headaches; blurred vision Abn MRI/MRA Scheduled now for cerebral arteriogram Pt aware of procedure benefits and risks and agreeable to proceed Consent signed and in chart   Adrienne Robinson A 02/09/2012, 10:05 AM

## 2012-02-09 NOTE — ED Notes (Signed)
DP/PT 2+

## 2012-02-12 ENCOUNTER — Ambulatory Visit (HOSPITAL_COMMUNITY)
Admission: RE | Admit: 2012-02-12 | Discharge: 2012-02-12 | Disposition: A | Discharge status: Home / Self Care | Payer: Managed Care, Other (non HMO) | Source: Ambulatory Visit | Attending: Interventional Radiology | Admitting: Interventional Radiology

## 2012-02-12 ENCOUNTER — Telehealth (HOSPITAL_COMMUNITY): Payer: Self-pay | Admitting: *Deleted

## 2012-02-12 DIAGNOSIS — Q2730 Arteriovenous malformation, site unspecified: Secondary | ICD-10-CM

## 2012-02-13 ENCOUNTER — Encounter: Payer: Self-pay | Admitting: Internal Medicine

## 2012-02-15 ENCOUNTER — Other Ambulatory Visit: Payer: Self-pay | Admitting: Radiology

## 2012-02-15 ENCOUNTER — Other Ambulatory Visit (HOSPITAL_COMMUNITY): Payer: Self-pay | Admitting: Interventional Radiology

## 2012-02-15 DIAGNOSIS — Q2730 Arteriovenous malformation, site unspecified: Secondary | ICD-10-CM

## 2012-02-18 ENCOUNTER — Encounter (HOSPITAL_COMMUNITY): Payer: Self-pay | Admitting: Emergency Medicine

## 2012-02-18 DIAGNOSIS — Y9289 Other specified places as the place of occurrence of the external cause: Secondary | ICD-10-CM | POA: Insufficient documentation

## 2012-02-18 DIAGNOSIS — F329 Major depressive disorder, single episode, unspecified: Secondary | ICD-10-CM | POA: Insufficient documentation

## 2012-02-18 DIAGNOSIS — M1990 Unspecified osteoarthritis, unspecified site: Secondary | ICD-10-CM | POA: Insufficient documentation

## 2012-02-18 DIAGNOSIS — Z79899 Other long term (current) drug therapy: Secondary | ICD-10-CM | POA: Insufficient documentation

## 2012-02-18 DIAGNOSIS — W010XXA Fall on same level from slipping, tripping and stumbling without subsequent striking against object, initial encounter: Secondary | ICD-10-CM | POA: Insufficient documentation

## 2012-02-18 DIAGNOSIS — I10 Essential (primary) hypertension: Secondary | ICD-10-CM | POA: Insufficient documentation

## 2012-02-18 DIAGNOSIS — F3289 Other specified depressive episodes: Secondary | ICD-10-CM | POA: Insufficient documentation

## 2012-02-18 DIAGNOSIS — F411 Generalized anxiety disorder: Secondary | ICD-10-CM | POA: Insufficient documentation

## 2012-02-18 DIAGNOSIS — S0180XA Unspecified open wound of other part of head, initial encounter: Secondary | ICD-10-CM | POA: Insufficient documentation

## 2012-02-18 DIAGNOSIS — Y9301 Activity, walking, marching and hiking: Secondary | ICD-10-CM | POA: Insufficient documentation

## 2012-02-18 DIAGNOSIS — R51 Headache: Secondary | ICD-10-CM | POA: Insufficient documentation

## 2012-02-18 NOTE — ED Notes (Addendum)
Pt became dizzy while walking down the hall, tripped and fell hitting head on baseboard, denies LOC, denies any other injuries.  Open laceration noted above right eye, bleeding controlled at present and ice applied.  Pt currently complaining of a headache and blurred vision at present.  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

## 2012-02-19 ENCOUNTER — Encounter (HOSPITAL_COMMUNITY): Payer: Self-pay | Admitting: Radiology

## 2012-02-19 ENCOUNTER — Emergency Department (HOSPITAL_COMMUNITY)
Admission: EM | Admit: 2012-02-19 | Discharge: 2012-02-19 | Disposition: A | Discharge status: Home / Self Care | Payer: Managed Care, Other (non HMO) | Attending: Emergency Medicine | Admitting: Emergency Medicine

## 2012-02-19 ENCOUNTER — Emergency Department (HOSPITAL_COMMUNITY): Payer: Managed Care, Other (non HMO)

## 2012-02-19 DIAGNOSIS — S0990XA Unspecified injury of head, initial encounter: Secondary | ICD-10-CM

## 2012-02-19 DIAGNOSIS — S0181XA Laceration without foreign body of other part of head, initial encounter: Secondary | ICD-10-CM

## 2012-02-19 DIAGNOSIS — W19XXXA Unspecified fall, initial encounter: Secondary | ICD-10-CM

## 2012-02-19 HISTORY — DX: Anxiety disorder, unspecified: F41.9

## 2012-02-19 MED ORDER — OXYCODONE-ACETAMINOPHEN 5-325 MG PO TABS
1.0000 | ORAL_TABLET | Freq: Once | ORAL | Status: AC
  Administered 2012-02-19: 1 via ORAL
  Filled 2012-02-19: qty 1

## 2012-02-19 MED ORDER — LIDOCAINE-EPINEPHRINE-TETRACAINE (LET) SOLUTION
3.0000 mL | Freq: Once | NASAL | Status: AC
  Administered 2012-02-19: 3 mL via TOPICAL
  Filled 2012-02-19: qty 3

## 2012-02-19 MED ORDER — OXYCODONE-ACETAMINOPHEN 5-325 MG PO TABS
1.0000 | ORAL_TABLET | ORAL | Status: DC | PRN
Start: 2012-02-19 — End: 2012-02-22

## 2012-02-19 NOTE — ED Notes (Signed)
Dr. Wickline at bedside.  

## 2012-02-19 NOTE — ED Provider Notes (Signed)
History     CSN: [id number]  Arrival date & time 02/18/12  2347   First MD Initiated Contact with Patient 02/19/12 0036      Chief Complaint  Patient presents with  . Fall    Patient is a 56 y.o. female presenting with fall. The history is provided by the patient.  Fall Incident onset: just prior to arrival. The fall occurred while walking. Point of impact: right forehead. The pain is mild. She was ambulatory at the scene. Associated symptoms include headaches. Pertinent negatives include no fever, no abdominal pain, no nausea, no vomiting and no loss of consciousness. The symptoms are aggravated by activity. She has tried rest for the symptoms. The treatment provided mild relief.  pt reports she was walking, felt dizzy and fell down.  No LOC. She hit her head and sustained laceration.  She is not on anticoagulants.    She has h/o AVM and is scheduled for embolization in early January.  She has been dealing with headaches, blurred vision and intermittent dizziness for over 5 weeks.  The episode tonight was not a new phenomenon for her. No cp/sob.  No focal weakness.  No neck pain.  No abd pain  Past Medical History  Diagnosis Date  . Hypertension   . Depression   . Degenerative joint disease involving multiple joints   . Spinal stenosis of lumbar region   . Anxiety   . AV fistula     Past Surgical History  Procedure Date  . Abdominal hysterectomy   . Oophorectomy   . Shoulder arthroscopy with rotator cuff repair and subacromial decompression 2007    left    Family History  Problem Relation Age of Onset  . Cancer Father 23    colon    History  Substance Use Topics  . Smoking status: Never Smoker   . Smokeless tobacco: Never Used  . Alcohol Use: 3.6 oz/week    6 Glasses of wine per week    OB History    Grav Para Term Preterm Abortions TAB SAB Ect Mult Living                  Review of Systems  Constitutional: Negative for fever.  Respiratory: Negative for  chest tightness and shortness of breath.   Cardiovascular: Negative for chest pain.  Gastrointestinal: Negative for nausea, vomiting and abdominal pain.  Skin: Positive for wound.  Neurological: Positive for headaches. Negative for loss of consciousness.  Psychiatric/Behavioral: Negative for agitation.  All other systems reviewed and are negative.    Allergies  Adhesive  Home Medications   Current Outpatient Rx  Name  Route  Sig  Dispense  Refill  . AMLODIPINE BESYLATE 5 MG PO TABS   Oral   Take 1 tablet (5 mg total) by mouth daily.   90 tablet   3   . ASPIRIN 81 MG PO TABS   Oral   Take 81 mg by mouth daily.         . BUPROPION HCL ER (XL) 300 MG PO TB24   Oral   Take 300 mg by mouth daily.         . OMEGA-3 FATTY ACIDS 1000 MG PO CAPS   Oral   Take 1 g by mouth daily.          Marland Kitchen GABAPENTIN 100 MG PO CAPS   Oral   Take 100-300 mg by mouth at bedtime as needed. For pain         .  GUAIFENESIN ER 600 MG PO TB12   Oral   Take 1,200 mg by mouth once.         . GUAIFENESIN 100 MG/5ML PO LIQD   Oral   Take 200 mg by mouth 3 (three) times daily as needed. For cold symptoms         . HYDROCODONE-ACETAMINOPHEN 7.5-500 MG PO TABS   Oral   Take 1 tablet by mouth every 6 (six) hours as needed for pain.   120 tablet   2   . MESALAMINE 1000 MG RE SUPP   Rectal   Place 1,000 mg rectally at bedtime.         . METHOCARBAMOL 750 MG PO TABS   Oral   Take 750 mg by mouth at bedtime as needed. For muscle pain         . PROPRANOLOL HCL 10 MG PO TABS   Oral   Take 10 mg by mouth daily as needed. for palpitations         . ZOLPIDEM TARTRATE 10 MG PO TABS   Oral   Take 10 mg by mouth at bedtime as needed. For sleep           BP 137/78  Pulse 83  Temp 98.2 F (36.8 C) (Oral)  Resp 19  Ht 5\' 5"  (1.651 m)  Wt 126 lb (57.153 kg)  BMI 20.97 kg/m2  SpO2 98%  Physical Exam CONSTITUTIONAL: Well developed/well nourished HEAD AND FACE: right  forehead laceration noted.  No active bleeding.  No bony crepitance.   EYES: EOMI/PERRL ENMT: Mucous membranes moist, No evidence of facial/nasal trauma NECK: supple no meningeal signs SPINE:entire spine nontender, NEXUS criteria met, No bruising/crepitance/stepoffs noted to spine CV: S1/S2 noted, no murmurs/rubs/gallops noted LUNGS: Lungs are clear to auscultation bilaterally, no apparent distress ABDOMEN: soft, nontender, no rebound or guarding GU:no cva tenderness NEURO: Pt is awake/alert, moves all extremitiesx4 No arm/leg drift.  No facial droop.  No past pointing EXTREMITIES: pulses normal, full ROM SKIN: warm, color normal PSYCH: no abnormalities of mood noted  ED Course  Procedures  LACERATION REPAIR Performed by: Joya Gaskins Consent: Verbal consent obtained. Risks and benefits: risks, benefits and alternatives were discussed Patient identity confirmed: provided demographic data Time out performed prior to procedure Prepped and Draped in normal sterile fashion Wound explored Laceration Location: right forehead Laceration Length: 2 cm No Foreign Bodies seen or palpated Anesthesia: local infiltration Local anesthetic: lidocaine 1% with epinephrine Anesthetic total: 3 ml  Amount of cleaning: standard Skin closure: simple Number of sutures or staples: 3 Technique: simple interrupted Patient tolerance: Patient tolerated the procedure well with no immediate complications.   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  The episode of dizziness tonight is similar to her previous episodes.  Doubt cardiac in origin.  Will obtain CT head, will repair laceration.  Will also obtain EKG.  Will defer labs as pt has had no vomiting/diarrhea and otherwise is at her baseline   Ct head negative.  Pt well appearing/ambulatory.  Stable for d/c  MDM  Nursing notes including past medical history and social history reviewed and considered in  documentation Previous records reviewed and considered        Date: 02/19/2012  Rate: 66  Rhythm: normal sinus rhythm  QRS Axis: normal  Intervals: normal  ST/T Wave abnormalities: normal  Conduction Disutrbances:none  Narrative Interpretation:   Old EKG Reviewed: none available at time of interpretation  Joya Gaskins, MD 02/19/12 [phone]

## 2012-02-19 NOTE — ED Notes (Signed)
Pt. Became dizzy and fell down and hit right forehead on baseboard. Laceration above right eye with swelling and bruising. Pt. 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 States increase in dizziness recently.

## 2012-02-19 NOTE — ED Notes (Signed)
Suture cart at bedside 

## 2012-02-20 ENCOUNTER — Ambulatory Visit: Payer: Managed Care, Other (non HMO) | Admitting: Internal Medicine

## 2012-02-21 ENCOUNTER — Ambulatory Visit: Payer: Self-pay | Admitting: Oncology

## 2012-02-22 ENCOUNTER — Encounter (HOSPITAL_COMMUNITY): Payer: Self-pay | Admitting: Pharmacy Technician

## 2012-02-22 ENCOUNTER — Encounter: Payer: Self-pay | Admitting: Internal Medicine

## 2012-02-22 ENCOUNTER — Other Ambulatory Visit: Payer: Self-pay | Admitting: Radiology

## 2012-02-22 NOTE — Pre-Procedure Instructions (Signed)
20 Robinson Adrienne  02/22/2012   Your procedure is scheduled on:  Monday, January 6th  Report to [facility] Stay [facility] at 0600 AM.  Call this number if you have problems the morning of surgery: [phone]   Remember:   Do not eat food or drink:After Midnight.   Take these medicines the morning of surgery with A SIP OF WATER: norvasc, wellbutrin, lortab or percocet if needed, propranolol   Do not wear jewelry, make-up or nail polish.  Do not wear lotions, powders, or perfumes.   Do not shave 48 hours prior to surgery. Men may shave face and neck.  Do not bring valuables to the hospital.  Contacts, dentures or bridgework may not be worn into surgery.  Leave suitcase in the car. After surgery it may be brought to your room.  For patients admitted to the hospital, checkout time is 11:00 AM the day of discharge.   Patients discharged the day of surgery will not be allowed to drive home.    Special Instructions: Shower using CHG 2 nights before surgery and the night before surgery.  If you shower the day of surgery use CHG.  Use special wash - you have one bottle of CHG for all showers.  You should use approximately 1/3 of the bottle for each shower.   Please read over the following fact sheets that you were given: Pain Booklet, Coughing and Deep Breathing, MRSA Information and Surgical Site Infection Prevention

## 2012-02-23 ENCOUNTER — Encounter (HOSPITAL_COMMUNITY)
Admission: RE | Admit: 2012-02-23 | Discharge: 2012-02-23 | Disposition: A | Discharge status: Home / Self Care | Payer: Managed Care, Other (non HMO) | Source: Ambulatory Visit | Attending: Interventional Radiology | Admitting: Interventional Radiology

## 2012-02-23 ENCOUNTER — Encounter (HOSPITAL_COMMUNITY): Payer: Self-pay | Admitting: Vascular Surgery

## 2012-02-23 ENCOUNTER — Encounter (HOSPITAL_COMMUNITY): Payer: Self-pay

## 2012-02-23 HISTORY — DX: Pneumonia, unspecified organism: J18.9

## 2012-02-23 HISTORY — DX: Dizziness and giddiness: R42

## 2012-02-23 LAB — CBC WITH DIFFERENTIAL/PLATELET
Basophils Absolute: 0 10*3/uL (ref 0.0–0.1)
Basophils Relative: 0 % (ref 0–1)
Eosinophils Absolute: 0.1 10*3/uL (ref 0.0–0.7)
Eosinophils Relative: 2 % (ref 0–5)
HCT: 32.9 % — ABNORMAL LOW (ref 36.0–46.0)
Hemoglobin: 11.3 g/dL — ABNORMAL LOW (ref 12.0–15.0)
Lymphocytes Relative: 40 % (ref 12–46)
Lymphs Abs: 1.2 10*3/uL (ref 0.7–4.0)
MCH: 31.2 pg (ref 26.0–34.0)
MCHC: 34.3 g/dL (ref 30.0–36.0)
MCV: 90.9 fL (ref 78.0–100.0)
Monocytes Absolute: 0.3 10*3/uL (ref 0.1–1.0)
Monocytes Relative: 10 % (ref 3–12)
Neutro Abs: 1.5 10*3/uL — ABNORMAL LOW (ref 1.7–7.7)
Neutrophils Relative %: 48 % (ref 43–77)
Platelets: 256 10*3/uL (ref 150–400)
RBC: 3.62 MIL/uL — ABNORMAL LOW (ref 3.87–5.11)
RDW: 12.1 % (ref 11.5–15.5)
WBC: 3.1 10*3/uL — ABNORMAL LOW (ref 4.0–10.5)

## 2012-02-23 LAB — APTT: aPTT: 34 seconds (ref 24–37)

## 2012-02-23 LAB — COMPREHENSIVE METABOLIC PANEL
ALT: 27 U/L (ref 0–35)
AST: 26 U/L (ref 0–37)
Albumin: 4.1 g/dL (ref 3.5–5.2)
Alkaline Phosphatase: 71 U/L (ref 39–117)
BUN: 12 mg/dL (ref 6–23)
CO2: 25 mEq/L (ref 19–32)
Calcium: 9.4 mg/dL (ref 8.4–10.5)
Chloride: 103 mEq/L (ref 96–112)
Creatinine, Ser: 0.87 mg/dL (ref 0.50–1.10)
GFR calc Af Amer: 85 mL/min — ABNORMAL LOW (ref >90)
GFR calc non Af Amer: 73 mL/min — ABNORMAL LOW (ref >90)
Glucose, Bld: 101 mg/dL — ABNORMAL HIGH (ref 70–99)
Potassium: 3.7 mEq/L (ref 3.5–5.1)
Sodium: 140 mEq/L (ref 135–145)
Total Bilirubin: 0.4 mg/dL (ref 0.3–1.2)
Total Protein: 6.9 g/dL (ref 6.0–8.3)

## 2012-02-23 LAB — PROTIME-INR
INR: 0.92 (ref 0.00–1.49)
Prothrombin Time: 12.3 seconds (ref 11.6–15.2)

## 2012-02-23 NOTE — Consult Note (Signed)
Anesthesia chart review: Patient is a 57 year old female scheduled for cerebral arteriogram with arterial venous malformation embolization by Dr. Corliss Skains on 02/26/2012. Other history includes nonsmoker, hypertension, ulcerative colitis, pneumonia, anxiety, depression, headaches, nasal sinus surgery, hysterectomy, lumbar spinal stenosis, normal coronaries by cath '[facility]).  PCP is Dr. Duncan Dull.   EKG on 02/19/12 showed NSR.  Cardiac cath at [facility] on 11/02/09 showed normal coronaries, no LV global or regional wall motion abnormalities (Dr. Harold Hedge).  Stress echo on 04/14/08 at [facility] showed normal LV systolic function with EF of 50%. Normal treadmill EKG without evidence of ischemia or arrhythmia. Normal stress echocardiographic images without evidence of myocardial ischemia. Slightly decreased exercise tolerance for age.  CXR on 02/23/12 showed no acute abnormalities.  Cererbral angiogram on 02/09/12 showed: S/P 4 vessel cerebral arteriogram  RT CFA approach.  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  Associated 7mm venous varix .  Single cortical vein draining into posterior superior sagittal sinus.  Preoperative labs noted.  Anticipate she can proceed as planned.  Shonna Chock, PA-C 02/23/12 1243

## 2012-02-23 NOTE — Progress Notes (Signed)
Patient informed Nurse that she had a stress test and cardiac cath with Dr. Lady Gary at [facility]. Will request. Patient denied having a sleep study.

## 2012-02-26 ENCOUNTER — Ambulatory Visit (HOSPITAL_COMMUNITY)
Admission: RE | Admit: 2012-02-26 | Discharge: 2012-02-26 | Disposition: A | Discharge status: Home / Self Care | Payer: Managed Care, Other (non HMO) | Source: Ambulatory Visit | Attending: Interventional Radiology | Admitting: Interventional Radiology

## 2012-02-26 ENCOUNTER — Encounter (HOSPITAL_COMMUNITY): Payer: Self-pay | Admitting: Vascular Surgery

## 2012-02-26 ENCOUNTER — Encounter (HOSPITAL_COMMUNITY): Payer: Self-pay

## 2012-02-26 ENCOUNTER — Inpatient Hospital Stay (HOSPITAL_COMMUNITY)
Admission: RE | Admit: 2012-02-26 | Discharge: 2012-02-28 | DRG: 027 | Disposition: A | Discharge status: Home / Self Care | Payer: Managed Care, Other (non HMO) | Source: Ambulatory Visit | Attending: Interventional Radiology | Admitting: Interventional Radiology

## 2012-02-26 ENCOUNTER — Encounter (HOSPITAL_COMMUNITY): Payer: Self-pay | Admitting: Anesthesiology

## 2012-02-26 ENCOUNTER — Ambulatory Visit (HOSPITAL_COMMUNITY): Payer: Managed Care, Other (non HMO) | Admitting: Vascular Surgery

## 2012-02-26 ENCOUNTER — Encounter (HOSPITAL_COMMUNITY): Payer: Self-pay | Admitting: Surgery

## 2012-02-26 ENCOUNTER — Encounter (HOSPITAL_COMMUNITY)
Admission: RE | Disposition: A | Discharge status: Home / Self Care | Payer: Self-pay | Source: Ambulatory Visit | Attending: Interventional Radiology

## 2012-02-26 DIAGNOSIS — Z79899 Other long term (current) drug therapy: Secondary | ICD-10-CM

## 2012-02-26 DIAGNOSIS — I10 Essential (primary) hypertension: Secondary | ICD-10-CM | POA: Diagnosis present

## 2012-02-26 DIAGNOSIS — Q2730 Arteriovenous malformation, site unspecified: Secondary | ICD-10-CM

## 2012-02-26 DIAGNOSIS — R51 Headache: Secondary | ICD-10-CM | POA: Diagnosis not present

## 2012-02-26 DIAGNOSIS — I671 Cerebral aneurysm, nonruptured: Principal | ICD-10-CM | POA: Diagnosis present

## 2012-02-26 DIAGNOSIS — F329 Major depressive disorder, single episode, unspecified: Secondary | ICD-10-CM | POA: Diagnosis present

## 2012-02-26 DIAGNOSIS — M159 Polyosteoarthritis, unspecified: Secondary | ICD-10-CM | POA: Diagnosis present

## 2012-02-26 DIAGNOSIS — Z01812 Encounter for preprocedural laboratory examination: Secondary | ICD-10-CM

## 2012-02-26 DIAGNOSIS — F3289 Other specified depressive episodes: Secondary | ICD-10-CM | POA: Diagnosis present

## 2012-02-26 DIAGNOSIS — Z01818 Encounter for other preprocedural examination: Secondary | ICD-10-CM

## 2012-02-26 DIAGNOSIS — M545 Low back pain, unspecified: Secondary | ICD-10-CM | POA: Diagnosis present

## 2012-02-26 DIAGNOSIS — I770 Arteriovenous fistula, acquired: Secondary | ICD-10-CM

## 2012-02-26 DIAGNOSIS — F411 Generalized anxiety disorder: Secondary | ICD-10-CM | POA: Diagnosis present

## 2012-02-26 HISTORY — PX: RADIOLOGY WITH ANESTHESIA: SHX6223

## 2012-02-26 LAB — POCT ACTIVATED CLOTTING TIME
Activated Clotting Time: 176 seconds
Activated Clotting Time: 160 seconds
Activated Clotting Time: 176 seconds

## 2012-02-26 SURGERY — RADIOLOGY WITH ANESTHESIA
Anesthesia: General

## 2012-02-26 MED ORDER — MIDAZOLAM HCL 2 MG/2ML IJ SOLN: 1.0000 mg | INTRAMUSCULAR | Status: DC | PRN

## 2012-02-26 MED ORDER — NITROGLYCERIN 5 MG/ML IV SOLN
1.5000 mg | INTRAVENOUS | Status: DC
  Filled 2012-02-26: qty 0.3

## 2012-02-26 MED ORDER — PROMETHAZINE HCL 25 MG/ML IJ SOLN: 6.2500 mg | INTRAMUSCULAR | Status: DC | PRN

## 2012-02-26 MED ORDER — CEFAZOLIN SODIUM 1-5 GM-% IV SOLN
1.0000 g | Freq: Once | INTRAVENOUS | Status: AC
  Administered 2012-02-26: 1 g via INTRAVENOUS
  Filled 2012-02-26 (×2): qty 50

## 2012-02-26 MED ORDER — PROPOFOL 10 MG/ML IV BOLUS
INTRAVENOUS | Status: DC | PRN
  Administered 2012-02-26: 100 mg via INTRAVENOUS
  Administered 2012-02-26: 80 mg via INTRAVENOUS

## 2012-02-26 MED ORDER — NEOSTIGMINE METHYLSULFATE 1 MG/ML IJ SOLN
INTRAMUSCULAR | Status: DC | PRN
  Administered 2012-02-26: 4 mg via INTRAVENOUS

## 2012-02-26 MED ORDER — ARTIFICIAL TEARS OP OINT
TOPICAL_OINTMENT | OPHTHALMIC | Status: DC | PRN
  Administered 2012-02-26: 1 via OPHTHALMIC

## 2012-02-26 MED ORDER — HYDROMORPHONE HCL PF 1 MG/ML IJ SOLN
1.0000 mg | Freq: Once | INTRAMUSCULAR | Status: AC
  Administered 2012-02-26: 1 mg via INTRAVENOUS

## 2012-02-26 MED ORDER — PHENOL 1.4 % MT LIQD
1.0000 | OROMUCOSAL | Status: DC | PRN
  Administered 2012-02-26: 1 via OROMUCOSAL
  Filled 2012-02-26: qty 177

## 2012-02-26 MED ORDER — FENTANYL CITRATE 0.05 MG/ML IJ SOLN
25.0000 ug | INTRAMUSCULAR | Status: DC | PRN
  Administered 2012-02-26 (×2): 50 ug via INTRAVENOUS

## 2012-02-26 MED ORDER — ACETAMINOPHEN 650 MG RE SUPP: 650.0000 mg | Freq: Four times a day (QID) | RECTAL | Status: DC | PRN

## 2012-02-26 MED ORDER — ROCURONIUM BROMIDE 100 MG/10ML IV SOLN
INTRAVENOUS | Status: DC | PRN
  Administered 2012-02-26: 10 mg via INTRAVENOUS
  Administered 2012-02-26: 40 mg via INTRAVENOUS

## 2012-02-26 MED ORDER — ASPIRIN EC 325 MG PO TBEC
DELAYED_RELEASE_TABLET | ORAL | Status: AC
  Filled 2012-02-26: qty 1

## 2012-02-26 MED ORDER — OXYCODONE HCL 5 MG/5ML PO SOLN: 5.0000 mg | Freq: Once | ORAL | Status: DC | PRN

## 2012-02-26 MED ORDER — MENTHOL 3 MG MT LOZG
1.0000 | LOZENGE | OROMUCOSAL | Status: DC | PRN
  Administered 2012-02-26: 3 mg via ORAL
  Filled 2012-02-26: qty 9

## 2012-02-26 MED ORDER — SODIUM CHLORIDE 0.9 % IV SOLN: INTRAVENOUS | Status: AC

## 2012-02-26 MED ORDER — FENTANYL CITRATE 0.05 MG/ML IJ SOLN
INTRAMUSCULAR | Status: DC | PRN
  Administered 2012-02-26 (×7): 50 ug via INTRAVENOUS

## 2012-02-26 MED ORDER — NICARDIPINE HCL IN NACL 20-0.86 MG/200ML-% IV SOLN
5.0000 mg/h | INTRAVENOUS | Status: DC
  Filled 2012-02-26: qty 200

## 2012-02-26 MED ORDER — PROTAMINE SULFATE 10 MG/ML IV SOLN
INTRAVENOUS | Status: DC | PRN
  Administered 2012-02-26 (×2): 5 mg via INTRAVENOUS

## 2012-02-26 MED ORDER — MIDAZOLAM HCL 5 MG/5ML IJ SOLN
INTRAMUSCULAR | Status: DC | PRN
  Administered 2012-02-26: 2 mg via INTRAVENOUS

## 2012-02-26 MED ORDER — HYDROCODONE-ACETAMINOPHEN 5-325 MG PO TABS
1.0000 | ORAL_TABLET | Freq: Four times a day (QID) | ORAL | Status: DC | PRN
  Administered 2012-02-26 – 2012-02-27 (×3): 1 via ORAL
  Filled 2012-02-26 (×3): qty 1

## 2012-02-26 MED ORDER — PHENYLEPHRINE HCL 10 MG/ML IJ SOLN
10.0000 mg | INTRAVENOUS | Status: DC | PRN
  Administered 2012-02-26 (×2): 10 ug/min via INTRAVENOUS

## 2012-02-26 MED ORDER — ASPIRIN EC 325 MG PO TBEC
325.0000 mg | DELAYED_RELEASE_TABLET | Freq: Once | ORAL | Status: AC
  Administered 2012-02-26: 325 mg via ORAL
  Filled 2012-02-26 (×2): qty 1

## 2012-02-26 MED ORDER — FENTANYL CITRATE 0.05 MG/ML IJ SOLN
INTRAMUSCULAR | Status: AC
  Filled 2012-02-26: qty 2

## 2012-02-26 MED ORDER — LACTATED RINGERS IV SOLN
INTRAVENOUS | Status: DC | PRN
  Administered 2012-02-26: 08:00:00 via INTRAVENOUS

## 2012-02-26 MED ORDER — SODIUM CHLORIDE 0.9 % IV SOLN: Freq: Once | INTRAVENOUS | Status: DC

## 2012-02-26 MED ORDER — IOHEXOL 300 MG/ML  SOLN
300.0000 mL | Freq: Once | INTRAMUSCULAR | Status: AC | PRN
  Administered 2012-02-26: 110 mL via INTRA_ARTERIAL

## 2012-02-26 MED ORDER — ONDANSETRON HCL 4 MG/2ML IJ SOLN
INTRAMUSCULAR | Status: DC | PRN
  Administered 2012-02-26: 4 mg via INTRAVENOUS

## 2012-02-26 MED ORDER — GLYCOPYRROLATE 0.2 MG/ML IJ SOLN
INTRAMUSCULAR | Status: DC | PRN
  Administered 2012-02-26: 0.4 mg via INTRAVENOUS
  Administered 2012-02-26: 0.1 mg via INTRAVENOUS
  Administered 2012-02-26: 0.6 mg via INTRAVENOUS
  Administered 2012-02-26: .1 mg via INTRAVENOUS

## 2012-02-26 MED ORDER — ONDANSETRON HCL 4 MG/2ML IJ SOLN: 4.0000 mg | Freq: Four times a day (QID) | INTRAMUSCULAR | Status: DC | PRN

## 2012-02-26 MED ORDER — LIDOCAINE HCL (CARDIAC) 20 MG/ML IV SOLN
INTRAVENOUS | Status: DC | PRN
  Administered 2012-02-26: 100 mg via INTRAVENOUS

## 2012-02-26 MED ORDER — FENTANYL CITRATE 0.05 MG/ML IJ SOLN: 50.0000 ug | Freq: Once | INTRAMUSCULAR | Status: DC

## 2012-02-26 MED ORDER — OXYCODONE HCL 5 MG PO TABS: 5.0000 mg | ORAL_TABLET | Freq: Once | ORAL | Status: DC | PRN

## 2012-02-26 MED ORDER — HYDROMORPHONE HCL PF 1 MG/ML IJ SOLN
INTRAMUSCULAR | Status: AC
  Administered 2012-02-26: 1 mg via INTRAVENOUS
  Filled 2012-02-26: qty 1

## 2012-02-26 MED ORDER — ACETAMINOPHEN 500 MG PO TABS
1000.0000 mg | ORAL_TABLET | Freq: Four times a day (QID) | ORAL | Status: DC | PRN
  Administered 2012-02-27: 1000 mg via ORAL
  Filled 2012-02-26: qty 2

## 2012-02-26 MED ORDER — HEPARIN SODIUM (PORCINE) 1000 UNIT/ML IJ SOLN
INTRAMUSCULAR | Status: DC | PRN
  Administered 2012-02-26: 3 mL via INTRAVENOUS

## 2012-02-26 MED ORDER — NIMODIPINE 30 MG PO CAPS
60.0000 mg | ORAL_CAPSULE | ORAL | Status: AC
  Administered 2012-02-26: 60 mg via ORAL
  Filled 2012-02-26 (×2): qty 2

## 2012-02-26 NOTE — Progress Notes (Signed)
Rec'd report from Laura RN, assuming care of patient at this time  

## 2012-02-26 NOTE — H&P (Signed)
Adrienne Robinson is an 57 y.o. female.   Chief Complaint: symptoms of blurred vision and dizziness x several weeks Neuro MD ordered MRI which showed possible AterioVenous Fistula 02/09/12 Cerebral arteriogram revealed Rt Arteriovenous fistula Scheduled now for embolization Pt has had a recent dizziness episode 8 days ago and fell at home; which required ED visit  And several stitches above rt eye HPI: HTN; dizziness; spinal stenosis  Past Medical History  Diagnosis Date  . Hypertension   . Depression   . Degenerative joint disease involving multiple joints   . Spinal stenosis of lumbar region   . Anxiety   . AV fistula   . Dizziness   . Headache     having since  past month and a half  . Pneumonia     approx 6 years ago  . Ulcerative proctitis     Past Surgical History  Procedure Date  . Abdominal hysterectomy   . Oophorectomy   . Shoulder arthroscopy with rotator cuff repair and subacromial decompression 2007    left  . Nasal sinus surgery   . Appendectomy   . Tonsillectomy   . Cardiac catheterization     normal coronaries, no wall motion abnormalities 11/02/09 [facility])    Family History  Problem Relation Age of Onset  . Cancer Father 38    colon   Social History:  reports that she has never smoked. She has never used smokeless tobacco. She reports that she drinks about 3.6 ounces of alcohol per week. She reports that she does not use illicit drugs.  Allergies:  Allergies  Allergen Reactions  . Adhesive (Tape) Rash and Other (See Comments)    reddness     (Not in a hospital admission)  No results found for this or any previous visit (from the past 48 hour(s)). No results found.  Review of Systems  Constitutional: Negative for fever and chills.  HENT: Negative for neck pain.   Eyes: Positive for blurred vision.  Respiratory: Negative for cough.   Cardiovascular: Negative for chest pain.  Gastrointestinal: Negative for nausea, vomiting and abdominal pain.    Neurological: Positive for dizziness and headaches.       Most recent dizzy spell 8 days ago; fell at home and required several stiches over right eye    There were no vitals taken for this visit. Physical Exam  Constitutional: She is oriented to person, place, and time. She appears well-developed and well-nourished.  HENT:       Area above rt eye has swelling and stitches after fall at home. ecchymotic beneath eye and down to rt chin  Eyes: EOM are normal.  Neck: Normal range of motion.  Cardiovascular: Normal rate, regular rhythm and normal heart sounds.   No murmur heard. Respiratory: Effort normal and breath sounds normal. She has no wheezes.  GI: Soft. Bowel sounds are normal. There is no tenderness.  Musculoskeletal: Normal range of motion.  Neurological: She is alert and oriented to person, place, and time.  Psychiatric: She has a normal mood and affect. Her behavior is normal. Judgment and thought content normal.     Assessment/Plan Sxs of dizziness and blurred vision x several weeks. AVF on cerebral arteriogram 02/09/12 Scheduled for ArterioVenous Fistula Embolization with Dr Corliss Skains Pt aware of procedure benefits and risks and agreeable to proceed Consent signed and in chart Pt aware may spend night in ICU after procedure and dc home in am.  Equan Cogbill A 02/26/2012, 7:22 AM

## 2012-02-26 NOTE — Anesthesia Preprocedure Evaluation (Addendum)
Anesthesia Evaluation  Patient identified by MRN, date of birth, ID band Patient awake    Reviewed: Allergy & Precautions, H&P , NPO status , Patient's Chart, lab work & pertinent test results  Airway Mallampati: I TM Distance: >3 FB Neck ROM: Full    Dental   Pulmonary  breath sounds clear to auscultation        Cardiovascular hypertension, Rhythm:Regular Rate:Normal     Neuro/Psych  Headaches, Anxiety Depression AV Fistula Recent fall w dizziness    GI/Hepatic   Endo/Other    Renal/GU      Musculoskeletal   Abdominal   Peds  Hematology   Anesthesia Other Findings   Reproductive/Obstetrics                          Anesthesia Physical Anesthesia Plan  ASA: III  Anesthesia Plan: General   Post-op Pain Management:    Induction: Intravenous  Airway Management Planned: Oral ETT  Additional Equipment: Arterial line  Intra-op Plan:   Post-operative Plan: Extubation in OR and Possible Post-op intubation/ventilation  Informed Consent: I have reviewed the patients History and Physical, chart, labs and discussed the procedure including the risks, benefits and alternatives for the proposed anesthesia with the patient or authorized representative who has indicated his/her understanding and acceptance.     Plan Discussed with: CRNA and Surgeon  Anesthesia Plan Comments:         Anesthesia Quick Evaluation

## 2012-02-26 NOTE — Preoperative (Signed)
Beta Blockers   Reason not to administer Beta Blockers:Not Applicable 

## 2012-02-26 NOTE — Anesthesia Postprocedure Evaluation (Signed)
  Anesthesia Post-op Note  Patient: Adrienne Robinson  Procedure(s) Performed: Procedure(s) (LRB) with comments: RADIOLOGY WITH ANESTHESIA (N/A)  Patient Location: PACU  Anesthesia Type:General  Level of Consciousness: awake  Airway and Oxygen Therapy: Patient Spontanous Breathing  Post-op Pain: mild  Post-op Assessment: Post-op Vital signs reviewed, Patient's Cardiovascular Status Stable, Respiratory Function Stable, Patent Airway, No signs of Nausea or vomiting and Pain level controlled  Post-op Vital Signs: stable  Complications: No apparent anesthesia complications

## 2012-02-26 NOTE — Procedures (Signed)
S/P Rt common carotid  Arteriogram  followed by liquid embolization of distal RT ACA  A 3/4 branch artreiovenous fistula, using onyx 34

## 2012-02-26 NOTE — Transfer of Care (Signed)
Immediate Anesthesia Transfer of Care Note  Patient: Adrienne Robinson  Procedure(s) Performed: Procedure(s) (LRB) with comments: RADIOLOGY WITH ANESTHESIA (N/A)  Patient Location: PACU  Anesthesia Type:General  Level of Consciousness: awake, alert , oriented and sedated  Airway & Oxygen Therapy: Patient Spontanous Breathing and Patient connected to nasal cannula oxygen  Post-op Assessment: Report given to PACU RN, Post -op Vital signs reviewed and stable and Patient moving all extremities  Post vital signs: Reviewed and stable  Complications: No apparent anesthesia complications

## 2012-02-26 NOTE — Progress Notes (Signed)
  Subjective:  Post procedure more awake,alert . C/O a 3/10 RT suraorbital peri orbital pain.Marland KitchenHelped by Dilaudid. Otherwise denies any visual,motor speech or sensory symptoms. Also c/o an irritating scratchy throat . Able to tolerate ice chips.  Objective: Vital signs in last 24 hours: Temp:  [97.2 F (36.2 C)-98 F (36.7 C)] 97.2 F (36.2 C) (01/06 1400) Pulse Rate:  [53-70] 53  (01/06 1530) Resp:  [11-21] 15  (01/06 1530) BP: (104-149)/(11-83) 130/57 mmHg (01/06 1530) SpO2:  [98 %-100 %] 100 % (01/06 1530) Arterial Line BP: (114-154)/(58-85) 154/80 mmHg (01/06 1530) Weight:  [125 lb 7.1 oz (56.9 kg)] 125 lb 7.1 oz (56.9 kg) (01/06 1400)    Intake/Output from previous day:   Intake/Output this shift: Total I/O In: -  Out: 1750 [Urine:1750]  On examination. Wide awake influenza no acute distress. VS  BP 120s to 130s /70s cuff. PR 50s to 60s SR  PaO2 > 95 % RA.Marland Kitchen  Neurologically   Alert, awake oriented to time ,place and space.Marland Kitchen  Speech and comprehension clear.Marland Kitchen  PEARLA..3mm Rt + LT  EOMS full.Jill Alexand[place].Full to confrontation.  No Facial asymmetry.  Tongue Midline..  Motor..           NO drift of outstretched arms..          Power.5/5 Proximally and distally all four extremities..  Fine motor and coordination to finger to nose equal..  Gait Not tested  Romberg Not tested.Alert, awake oriented to time ,place and space..  RT  Sided  infra orbital and facial ecchymosis from recent fall stable. Rt groin  Slightly tender . No palpable hematoma. Pulses 2+r  Lab Results:  No results found for this basename: WBC:2,HGB:2,HCT:2,PLT:2 in the last 72 hours BMET No results found for this basename: NA:2,K:2,CL:2,CO2:2,GLUCOSE:2,BUN:2,CREATININE:2,CALCIUM:2 in the last 72 hours PT/INR No results found for this basename: LABPROT:2,INR:2 in the last 72 hours ABG No results found for this basename: PHART:2,PCO2:2,PO2:2,HCO3:2 in the last 72  hours  Studies/Results: No results found.  Anti-infectives: Anti-infectives    None      Assessment/Plan: s/p Liquid embolization of Rt posterior deep parietal AV fistula. Plan.. 1. Close neuro obs 2. Continue BP monitoring as per orders. 3. Throat lozenges.. 4. Advance diet as tollerated to free liquid diet. D/W patient and family. Isatu Macinnes K 02/26/2012

## 2012-02-27 ENCOUNTER — Inpatient Hospital Stay (HOSPITAL_COMMUNITY): Payer: Managed Care, Other (non HMO)

## 2012-02-27 ENCOUNTER — Encounter (HOSPITAL_COMMUNITY): Payer: Self-pay | Admitting: Anesthesiology

## 2012-02-27 ENCOUNTER — Encounter (HOSPITAL_COMMUNITY): Payer: Self-pay | Admitting: Interventional Radiology

## 2012-02-27 LAB — CBC WITH DIFFERENTIAL/PLATELET
Basophils Absolute: 0 10*3/uL (ref 0.0–0.1)
Basophils Relative: 0 % (ref 0–1)
Eosinophils Absolute: 0.1 10*3/uL (ref 0.0–0.7)
Eosinophils Relative: 1 % (ref 0–5)
HCT: 31.3 % — ABNORMAL LOW (ref 36.0–46.0)
Hemoglobin: 10.8 g/dL — ABNORMAL LOW (ref 12.0–15.0)
Lymphocytes Relative: 30 % (ref 12–46)
Lymphs Abs: 1.2 10*3/uL (ref 0.7–4.0)
MCH: 31.6 pg (ref 26.0–34.0)
MCHC: 34.5 g/dL (ref 30.0–36.0)
MCV: 91.5 fL (ref 78.0–100.0)
Monocytes Absolute: 0.4 10*3/uL (ref 0.1–1.0)
Monocytes Relative: 10 % (ref 3–12)
Neutro Abs: 2.3 10*3/uL (ref 1.7–7.7)
Neutrophils Relative %: 59 % (ref 43–77)
Platelets: 233 10*3/uL (ref 150–400)
RBC: 3.42 MIL/uL — ABNORMAL LOW (ref 3.87–5.11)
RDW: 12.1 % (ref 11.5–15.5)
WBC: 4 10*3/uL (ref 4.0–10.5)

## 2012-02-27 LAB — APTT: aPTT: 37 seconds (ref 24–37)

## 2012-02-27 LAB — BASIC METABOLIC PANEL
BUN: 7 mg/dL (ref 6–23)
CO2: 29 mEq/L (ref 19–32)
Calcium: 9.5 mg/dL (ref 8.4–10.5)
Chloride: 103 mEq/L (ref 96–112)
Creatinine, Ser: 0.87 mg/dL (ref 0.50–1.10)
GFR calc Af Amer: 85 mL/min — ABNORMAL LOW (ref >90)
GFR calc non Af Amer: 73 mL/min — ABNORMAL LOW (ref >90)
Glucose, Bld: 95 mg/dL (ref 70–99)
Potassium: 3.8 mEq/L (ref 3.5–5.1)
Sodium: 139 mEq/L (ref 135–145)

## 2012-02-27 LAB — PROTIME-INR
INR: 0.95 (ref 0.00–1.49)
Prothrombin Time: 12.6 seconds (ref 11.6–15.2)

## 2012-02-27 MED ORDER — PROPRANOLOL HCL 10 MG PO TABS
10.0000 mg | ORAL_TABLET | Freq: Every day | ORAL | Status: DC | PRN
  Filled 2012-02-27: qty 1

## 2012-02-27 MED ORDER — DEXAMETHASONE SODIUM PHOSPHATE 10 MG/ML IJ SOLN
10.0000 mg | Freq: Once | INTRAMUSCULAR | Status: DC
  Filled 2012-02-27: qty 1

## 2012-02-27 MED ORDER — IBUPROFEN 400 MG PO TABS
400.0000 mg | ORAL_TABLET | ORAL | Status: DC | PRN
  Administered 2012-02-27: 400 mg via ORAL
  Filled 2012-02-27 (×2): qty 1

## 2012-02-27 MED ORDER — PANTOPRAZOLE SODIUM 40 MG IV SOLR
40.0000 mg | Freq: Once | INTRAVENOUS | Status: AC
  Administered 2012-02-27: 40 mg via INTRAVENOUS
  Filled 2012-02-27: qty 40

## 2012-02-27 MED ORDER — DEXAMETHASONE SODIUM PHOSPHATE 4 MG/ML IJ SOLN
8.0000 mg | INTRAMUSCULAR | Status: AC
  Administered 2012-02-27: 8 mg via INTRAVENOUS
  Filled 2012-02-27: qty 2

## 2012-02-27 MED ORDER — KETOROLAC TROMETHAMINE 30 MG/ML IJ SOLN
30.0000 mg | INTRAMUSCULAR | Status: AC
  Administered 2012-02-27 (×2): 30 mg via INTRAVENOUS
  Filled 2012-02-27 (×2): qty 1

## 2012-02-27 MED ORDER — BUPROPION HCL ER (XL) 300 MG PO TB24
300.0000 mg | ORAL_TABLET | Freq: Every day | ORAL | Status: DC
  Administered 2012-02-27 – 2012-02-28 (×2): 300 mg via ORAL
  Filled 2012-02-27 (×2): qty 1

## 2012-02-27 MED ORDER — AMLODIPINE BESYLATE 5 MG PO TABS
5.0000 mg | ORAL_TABLET | Freq: Every day | ORAL | Status: DC
  Administered 2012-02-27 – 2012-02-28 (×2): 5 mg via ORAL
  Filled 2012-02-27 (×2): qty 1

## 2012-02-27 MED ORDER — GABAPENTIN 100 MG PO CAPS
100.0000 mg | ORAL_CAPSULE | Freq: Every evening | ORAL | Status: DC | PRN
  Filled 2012-02-27: qty 3

## 2012-02-27 MED ORDER — KETOROLAC TROMETHAMINE 30 MG/ML IJ SOLN
30.0000 mg | Freq: Four times a day (QID) | INTRAMUSCULAR | Status: DC
  Administered 2012-02-27 – 2012-02-28 (×3): 30 mg via INTRAVENOUS
  Filled 2012-02-27 (×7): qty 1

## 2012-02-27 MED ORDER — HYDROMORPHONE HCL PF 1 MG/ML IJ SOLN
1.0000 mg | Freq: Four times a day (QID) | INTRAMUSCULAR | Status: DC | PRN
  Administered 2012-02-27 (×2): 1 mg via INTRAVENOUS
  Filled 2012-02-27 (×2): qty 1

## 2012-02-27 MED ORDER — DEXAMETHASONE SODIUM PHOSPHATE 4 MG/ML IJ SOLN
2.0000 mg | Freq: Four times a day (QID) | INTRAMUSCULAR | Status: DC
  Administered 2012-02-27 – 2012-02-28 (×3): 2 mg via INTRAVENOUS
  Filled 2012-02-27: qty 0.5
  Filled 2012-02-27 (×2): qty 1
  Filled 2012-02-27 (×3): qty 0.5
  Filled 2012-02-27: qty 1

## 2012-02-27 MED ORDER — ASPIRIN 81 MG PO CHEW
81.0000 mg | CHEWABLE_TABLET | Freq: Every day | ORAL | Status: DC
  Administered 2012-02-27 – 2012-02-28 (×2): 81 mg via ORAL
  Filled 2012-02-27 (×2): qty 1

## 2012-02-27 MED ORDER — FENTANYL CITRATE 0.05 MG/ML IJ SOLN
25.0000 ug | INTRAMUSCULAR | Status: DC | PRN
  Administered 2012-02-27 – 2012-02-28 (×7): 25 ug via INTRAVENOUS
  Filled 2012-02-27 (×7): qty 2

## 2012-02-27 MED ORDER — DEXAMETHASONE SODIUM PHOSPHATE 4 MG/ML IJ SOLN
2.0000 mg | Freq: Once | INTRAMUSCULAR | Status: AC
  Administered 2012-02-27: 2 mg via INTRAVENOUS
  Filled 2012-02-27: qty 1

## 2012-02-27 NOTE — Progress Notes (Signed)
1 Day Post-Op  Subjective: Rt anterior cerebral artery arteriovenous fistula embolization performed in IR with Dr Corliss Skains 02/26/2011 Pt has done well overnight except for headache at top of head on Rt No visual change; no speech change neuro intact No N/V - but not hungry because of ha Dilaudid IV helps but only for a short time Vicodin not much relief   Objective: Vital signs in last 24 hours: Temp:  [97.2 F (36.2 C)-99 F (37.2 C)] 99 F (37.2 C) (01/07 0400) Pulse Rate:  [53-78] 71  (01/07 0700) Resp:  [9-21] 19  (01/07 0700) BP: (91-131)/(11-76) 91/54 mmHg (01/07 0700) SpO2:  [97 %-100 %] 98 % (01/07 0700) Arterial Line BP: (114-172)/(58-104) 164/95 mmHg (01/06 1900) Weight:  [125 lb 7.1 oz (56.9 kg)] 125 lb 7.1 oz (56.9 kg) (01/06 1400) Last BM Date: 02/26/12  Intake/Output from previous day: 01/06 0701 - 01/07 0700 In: 2646.3 [P.O.:840; I.V.:1806.3] Out: 5400 [Urine:5400] Intake/Output this shift:    PE:  A/O Afeb; VSS obviously  in pain; squints eyes Points to Rt top of head and down to sinus area Vision clear Able to identify number of fingers; puffs cheeks equally Smile = Tongue midline Rt groin no hematoma; NT; no bleeding Rt foot 2+ pulses Dr Corliss Skains has also examined pt Labs today stable coags pending     Lab Results:   [facility] 02/27/12 0439  WBC 4.0  HGB 10.8*  HCT 31.3*  PLT 233   BMET  Basename 02/27/12 0439  NA 139  K 3.8  CL 103  CO2 29  GLUCOSE 95  BUN 7  CREATININE 0.87  CALCIUM 9.5   PT/INR No results found for this basename: LABPROT:2,INR:2 in the last 72 hours ABG No results found for this basename: PHART:2,PCO2:2,PO2:2,HCO3:2 in the last 72 hours  Studies/Results: No results found.  Anti-infectives: Anti-infectives     Start     Dose/Rate Route Frequency Ordered Stop   02/26/12 0700   ceFAZolin (ANCEF) IVPB 1 g/50 mL premix        1 g 100 mL/hr over 30 Minutes Intravenous  Once 02/26/12 0653 02/26/12 1610          Assessment/Plan: s/p Procedure(s) (LRB) with comments: RADIOLOGY WITH ANESTHESIA (N/A)  Rt ACA AVF embolization 1/6 Pt has tolerated procedure but with severe headache now Decadron; Protonix and Toradol ordered Dc foley; dc vicodin; dc dilaudid Dr Corliss Skains has seen and examined pt Will check later Plan: still hope to dc pt to home this afternoon   LOS: 1 day    Azael Ragain A 02/27/2012

## 2012-02-27 NOTE — Addendum Note (Signed)
Addendum  created 02/27/12 0750 by Davyon Fisch M Kennia Vanvorst, CRNA   Modules edited:Anesthesia Medication Administration    

## 2012-02-27 NOTE — Progress Notes (Signed)
  Pt complaining of worsening HA, not responding to prn Vicodin. Neuro intact, pt alert,  BP 126/80 HR 72.  Dr. Corliss Skains paged. Orders given for 1mg  hydromorphone q6hr prn.  Pt states pain improved with hydromorphone.   Will continue to monitor.

## 2012-02-27 NOTE — Addendum Note (Signed)
Addendum  created 02/27/12 0750 by Edmonia Caprio, CRNA   Modules edited:Anesthesia Medication Administration

## 2012-02-27 NOTE — Addendum Note (Signed)
Encounter addended by: Gatha Mayer on: 02/27/2012 11:53 AM<BR>     Documentation filed: Orders

## 2012-02-28 NOTE — Discharge Summary (Signed)
Physician Discharge Summary  Patient ID: Adrienne [place] MRN: [id number] DOB/AGE: 03/25/1955 57 y.o.  Admit date: 02/26/2012 Discharge date: 02/28/2012  Admission Diagnoses: Anterior Cerebral Artery Arteriovenous Fistula  Discharge Diagnoses: Anterior Cerebral Artery Arteriovenous Fistula Embolization Active Problems:  * No active hospital problems. *  HTN; DJD; Spinal stenosis; Ulc Colitis; Panic attacks  Discharged Condition: stable; improved  Hospital Course: Pt has had Anterior cerebral artery fistula embolization performed in Int radiology 1/6 by Dr Julieanne Cotton. Procedure was without complication. She developed severe headache evening of procedure and next am. CT Head was neg for abnormality. Was kept another overnight stay for pain control. Responded well to IV Decadron; IV Toradol; and IV Fentanyl. Headache this am was minimal. All of these meds were discontinued and pt maintained on po meds.  Eating well; no N/V. Slept well.  Urinating well and passing gas. Pt has been seen and examined by Dr Corliss Skains. Plan for dc now. Pt to follow with Dr Corliss Skains Jan 20th; 100 pm.  Continue home meds. Rx: Percocet 7.5/500 mg #30.  Consults: none  Significant Diagnostic Studies: Cerebral arteriogram  Treatments: Anterior Cerebral Artery Arteriovenous Fistula Embolization  Discharge Exam: Results for orders placed during the hospital encounter of 02/26/12  CBC WITH DIFFERENTIAL      Component Value Range   WBC 4.0  4.0 - 10.5 K/uL   RBC 3.42 (*) 3.87 - 5.11 MIL/uL   Hemoglobin 10.8 (*) 12.0 - 15.0 g/dL   HCT 40.9 (*) 81.1 - 91.4 %   MCV 91.5  78.0 - 100.0 fL   MCH 31.6  26.0 - 34.0 pg   MCHC 34.5  30.0 - 36.0 g/dL   RDW 78.2  95.6 - 21.3 %   Platelets 233  150 - 400 K/uL   Neutrophils Relative 59  43 - 77 %   Neutro Abs 2.3  1.7 - 7.7 K/uL   Lymphocytes Relative 30  12 - 46 %   Lymphs Abs 1.2  0.7 - 4.0 K/uL   Monocytes Relative 10  3 - 12 %   Monocytes Absolute 0.4  0.1 -  1.0 K/uL   Eosinophils Relative 1  0 - 5 %   Eosinophils Absolute 0.1  0.0 - 0.7 K/uL   Basophils Relative 0  0 - 1 %   Basophils Absolute 0.0  0.0 - 0.1 K/uL  BASIC METABOLIC PANEL      Component Value Range   Sodium 139  135 - 145 mEq/L   Potassium 3.8  3.5 - 5.1 mEq/L   Chloride 103  96 - 112 mEq/L   CO2 29  19 - 32 mEq/L   Glucose, Bld 95  70 - 99 mg/dL   BUN 7  6 - 23 mg/dL   Creatinine, Ser 0.86  0.50 - 1.10 mg/dL   Calcium 9.5  8.4 - 57.8 mg/dL   GFR calc non Af Amer 73 (*) >90 mL/min   GFR calc Af Amer 85 (*) >90 mL/min  APTT      Component Value Range   aPTT 37  24 - 37 seconds  PROTIME-INR      Component Value Range   Prothrombin Time 12.6  11.6 - 15.2 seconds   INR 0.95  0.00 - 1.49   Blood pressure 121/65, pulse 80, temperature 97.9 F (36.6 C), temperature source Oral, resp. rate 15, height 5\' 5"  (1.651 m), weight 125 lb 7.1 oz (56.9 kg), SpO2 98.00%.  PE:  VSS; afeb UOP wnl Heart: RRR  Lungs: CTA Abd: soft; +BS; NT Ext: FROM; ambulating Gait steady A/O; appropriate Tongue midline EOMI; face symmetrical Rt groin: NT; no bleeding; no hematoma Rt foot: 2+ pulses   Disposition: Anterior Cerebral Artery Arteriovenous Fistula Embolization performed in IR with Dr Corliss Skains 02/26/2012. Pt has done well. Post procedure headache minimal now. Continue all home meds Rx: Percocet 7.5/500 #30 as needed Use Ibuprofen from home as needed Cont ASA 81 mg daily. Pt has been seen and examined by Dr Corliss Skains Discharge home now Follow up appt 03/11/12 at 100 pm at [facility] radiology Call [phone] if questions or concerns Pt and husband have good understanding of discharge instructions and plan.  Discharge Orders    Future Appointments: Provider: Department: Dept Phone: Center:   03/05/2012 2:45 PM Sherlene Shams, MD [facility] PRIMARY [facility] [place] [phone] None   03/11/2012 1:00 PM [facility] INTERVENTIONAL RADIOLOGY [phone] [facility]      Future Orders Please Complete By Expires   Diet - low sodium heart healthy      Increase activity slowly      Discharge instructions      Comments:   Follow up with Dr Corliss Skains Jan 20th at 100 pm (be at [facility] 1245pm); call [phone] if any problems or concerns   Driving Restrictions      Comments:   No driving x 2 weeks   Lifting restrictions      Comments:   No lifting over 10 lbs x 2 weeks   Discharge wound care:      Comments:   May shower; keep band aid on Rt groin site x 1 week   Call MD for:  temperature >100.4      Call MD for:  persistant nausea and vomiting      Call MD for:  severe uncontrolled pain      Call MD for:  redness, tenderness, or signs of infection (pain, swelling, redness, odor or green/yellow discharge around incision site)      Call MD for:  difficulty breathing, headache or visual disturbances      Call MD for:  persistant dizziness or light-headedness          Medication List     As of 02/28/2012 10:38 AM    TAKE these medications         amLODipine 5 MG tablet   Commonly known as: NORVASC   Take 1 tablet (5 mg total) by mouth daily.      aspirin 81 MG tablet   Take 81 mg by mouth daily.      buPROPion 300 MG 24 hr tablet   Commonly known as: WELLBUTRIN XL   Take 300 mg by mouth daily.      fish oil-omega-3 fatty acids 1000 MG capsule   Take 1 g by mouth daily.      gabapentin 100 MG capsule   Commonly known as: NEURONTIN   Take 100-300 mg by mouth at bedtime as needed. For pain      guaiFENesin 100 MG/5ML liquid   Commonly known as: ROBITUSSIN   Take 200 mg by mouth 3 (three) times daily as needed. For cold symptoms      HYDROcodone-acetaminophen 7.5-500 MG per tablet   Commonly known as: LORTAB   Take 1 tablet by mouth every 6 (six) hours as needed for pain.      mesalamine 1000 MG suppository   Commonly known as: CANASA   Place 1,000 mg rectally at bedtime.  methocarbamol 750 MG tablet   Commonly known as: ROBAXIN   Take  750 mg by mouth at bedtime as needed. For muscle pain      oxyCODONE-acetaminophen 5-325 MG per tablet   Commonly known as: PERCOCET/ROXICET   Take 1 tablet by mouth every 6 (six) hours as needed. For pain      propranolol 10 MG tablet   Commonly known as: INDERAL   Take 10 mg by mouth daily as needed. for palpitations      zolpidem 10 MG tablet   Commonly known as: AMBIEN   Take 10 mg by mouth at bedtime as needed. For sleep         Signed: Yuniel Blaney A 02/28/2012, 10:38 AM

## 2012-02-28 NOTE — Progress Notes (Signed)
2 Days Post-Op  Subjective: Ant Cerebral artery AV fistula embolization 1/6 Pt so much better today HA still there: "3-4" on scale 10 No N/V Eating well; sleeping OK No visual disturbance; hearing well Good urination and +gas  Objective: Vital signs in last 24 hours: Temp:  [97.8 F (36.6 C)-98.6 F (37 C)] 97.8 F (36.6 C) (01/08 0400) Pulse Rate:  [67-94] 72  (01/08 0700) Resp:  [11-24] 24  (01/08 0700) BP: (106-139)/(56-94) 119/94 mmHg (01/08 0700) SpO2:  [95 %-100 %] 100 % (01/08 0700) Last BM Date: 02/26/12  Intake/Output from previous day: 01/07 0701 - 01/08 0700 In: 1420 [P.O.:1420] Out: 900 [Urine:900] Intake/Output this shift:    PE:  afeb VSS A/O Neuro intact FROM Rt groin wnl Rt foot good pulse Facial ecchymosis post fall at home 10 days ago - resolving  Lab Results:   [facility] 02/27/12 0439  WBC 4.0  HGB 10.8*  HCT 31.3*  PLT 233   BMET  Basename 02/27/12 0439  NA 139  K 3.8  CL 103  CO2 29  GLUCOSE 95  BUN 7  CREATININE 0.87  CALCIUM 9.5   PT/INR  Basename 02/27/12 0934  LABPROT 12.6  INR 0.95   ABG No results found for this basename: PHART:2,PCO2:2,PO2:2,HCO3:2 in the last 72 hours  Studies/Results: Ct Head Wo Contrast  02/27/2012  *RADIOLOGY REPORT*  Clinical Data: Headache one date post aneurysm occlusion procedure  CT HEAD WITHOUT CONTRAST  Technique:  Contiguous axial images were obtained from the base of the skull through the vertex without contrast.  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  There is no appreciable surrounding edema.  Ventricles are normal in size and configuration.  There is no mass, hemorrhage, extra-axial fluid collection, or midline shift.  Wallace Cullens- white compartments are normal.  Bony calvarium appears intact.  The mastoid air cells are clear.  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  Study otherwise unremarkable.    Original Report Authenticated By: Bretta Bang, M.D.     Anti-infectives: Anti-infectives     Start     Dose/Rate Route Frequency Ordered Stop   02/26/12 0700   ceFAZolin (ANCEF) IVPB 1 g/50 mL premix        1 g 100 mL/hr over 30 Minutes Intravenous  Once 02/26/12 0653 02/26/12 3086          Assessment/Plan: s/p Procedure(s) (LRB) with comments: RADIOLOGY WITH ANESTHESIA (N/A)  Anterior Cerebral Artery Arteriovenous  Fistula Embolization 1/6 Pt better today H/A only minimal Plan for prob dc today Will report to Dr Corliss Skains   LOS: 2 days    Reyaansh Merlo A 02/28/2012

## 2012-02-28 NOTE — Progress Notes (Signed)
Nursing 1140 MD order to discharge patient to home.  Patient has stabilized neurologically with decreased headache.  Patient ambulates well without dizziness and tolerated sitting in the chair.  Vital signs stable, patient voiding independently, patient has not had a BM, but is passing flatus.  Right groin site is a nadel hayes 0 and RN placed bandaid over site.  Patient and spouse given discharge instructions and education and verbalized understanding, prescription for Percocet given to patient.  Patient aware of follow-up appointment.

## 2012-02-29 ENCOUNTER — Other Ambulatory Visit: Payer: Self-pay | Admitting: Internal Medicine

## 2012-03-04 ENCOUNTER — Encounter: Payer: Self-pay | Admitting: Internal Medicine

## 2012-03-05 ENCOUNTER — Encounter: Payer: Self-pay | Admitting: Internal Medicine

## 2012-03-05 ENCOUNTER — Ambulatory Visit (INDEPENDENT_AMBULATORY_CARE_PROVIDER_SITE_OTHER): Payer: Managed Care, Other (non HMO) | Admitting: Internal Medicine

## 2012-03-05 VITALS — BP 122/80 | HR 71 | Temp 97.7°F | Resp 16 | Wt 124.2 lb

## 2012-03-05 DIAGNOSIS — Q282 Arteriovenous malformation of cerebral vessels: Secondary | ICD-10-CM

## 2012-03-05 DIAGNOSIS — D709 Neutropenia, unspecified: Secondary | ICD-10-CM

## 2012-03-05 DIAGNOSIS — D708 Other neutropenia: Secondary | ICD-10-CM | POA: Insufficient documentation

## 2012-03-05 DIAGNOSIS — M48061 Spinal stenosis, lumbar region without neurogenic claudication: Secondary | ICD-10-CM

## 2012-03-05 DIAGNOSIS — D72819 Decreased white blood cell count, unspecified: Secondary | ICD-10-CM

## 2012-03-05 DIAGNOSIS — Q283 Other malformations of cerebral vessels: Secondary | ICD-10-CM

## 2012-03-05 MED ORDER — ZOLPIDEM TARTRATE 10 MG PO TABS: 10.0000 mg | ORAL_TABLET | Freq: Every evening | ORAL | Status: DC | PRN

## 2012-03-05 NOTE — Assessment & Plan Note (Signed)
She has seen [facility] physiatrist Naaman Plummer who is confident that he will be able to improve her symptomatology. She was very impressed with her evaluation by him and is planning on having facet joint injections.continue Vicodin for now.

## 2012-03-05 NOTE — Patient Instructions (Signed)
We will repeat your CBC in early February, and then quarterly to monitor

## 2012-03-05 NOTE — Progress Notes (Signed)
Patient ID: Adrienne Robinson, female   DOB: 1955-02-24, 57 y.o.   MRN: [id number]  Patient Active Problem List  Diagnosis  . Hypertension  . Degenerative joint disease involving multiple joints  . Spinal stenosis of lumbar region  . Family history of colon cancer  . Ulcerative colitis with rectal bleeding  . Panic attack as reaction to stress  . Palpitations  . Fatigue  . Need for influenza vaccination  . AVM (arteriovenous malformation) brain    Subjective:  CC:   Chief Complaint  Patient presents with  . Follow-up    HPI:   Adrienne Johnsonis a 57 y.o. female who presents for follow up on multiple issues.  She was admitted to [facility] on June 6th for 2 nights following embolization of AV malformation of brain found on arteriogram done on dec 20th . Her procedure was succesful but was complicated by a severe headache post procedure.  An emergent head Ct was done which was normal. Dr.  Corliss Skains felt that the headache was due to inflammation caused by sudden increase in perfusion of previously underperfused areas of her brain due to the congenital malformation.     Since discharge her headaches, blurred vision and dizziness have all resolved.  Prior to her procedure she had a dizzy spell that resulted in a fall at home on Dec 29th.  She sustained a laceration to her righth temporal/frontal scalp which was treated in the [facility] with stitches following a normal CT scan   Saw [facility] (patient had an excellent experience) . Did not have the  Lumbar MRI done at [facility] but thinks the facets are the source of pain in lumbar area, and the shoulder muscle pain is coming from the scoliosis .  Palpitations have resolved, but still having achiness in chest and chest soreness.  No arrhythmias noted during [name]      Past Medical History  Diagnosis Date  . Hypertension   . Depression   . Degenerative joint disease involving multiple joints   .  Spinal stenosis of lumbar region   . Anxiety   . Dizziness   . Headache     having since  past month and a half  . Pneumonia     approx 6 years ago  . Ulcerative proctitis   . AVM (arteriovenous malformation) brain Dec 2013    s/p embolization  Feb 26 2012, [name]    Past Surgical History  Procedure Date  . Abdominal hysterectomy   . Oophorectomy   . Shoulder arthroscopy with rotator cuff repair and subacromial decompression 2007    left  . Nasal sinus surgery   . Appendectomy   . Tonsillectomy   . Cardiac catheterization     normal coronaries, no wall motion abnormalities 11/02/09 [name]  . Radiology with anesthesia 02/26/2012    Procedure: RADIOLOGY WITH ANESTHESIA;  Surgeon: Oneal Grout, MD;  Location: [facility] OR;  Service: Radiology;  Laterality: N/A;         The following portions of the patient's history were reviewed and updated as appropriate: Allergies, current medications, and problem list.    Review of Systems:   Patient denies headache, fevers, malaise, unintentional weight loss, skin rash, eye pain, sinus congestion and sinus pain, sore throat, dysphagia,  hemoptysis , cough, dyspnea, wheezing, chest pain, palpitations, orthopnea, edema, abdominal pain, nausea, melena, diarrhea, constipation, flank pain, dysuria, hematuria, urinary  Frequency, nocturia, numbness, tingling, seizures,  Focal weakness, Loss of consciousness,  Tremor, insomnia, depression, anxiety, and suicidal ideation.        History   Social History  . Marital Status: Married    Spouse Name: N/A    Number of Children: N/A  . Years of Education: N/A   Occupational History  . Not on file.   Social History Main Topics  . Smoking status: Never Smoker   . Smokeless tobacco: Never Used  . Alcohol Use: 3.6 oz/week    6 Glasses of wine per week     Comment: 2-3 week with dinner  . Drug Use: No  . Sexually Active: Not on file   Other Topics Concern  . Not on file   Social History  Narrative  . No narrative on file    Objective:  BP 122/80  Pulse 71  Temp 97.7 F (36.5 C) (Oral)  Resp 16  Wt 124 lb 4 oz (56.359 kg)  SpO2 97%  General appearance: alert, cooperative and appears stated age  Ears: normal TM's and external ear canals both ears. Healing ecchymosis right zygoma Throat: lips, mucosa, and tongue normal; teeth and gums normal Neck: no adenopathy, no carotid bruit, supple, symmetrical, trachea midline and thyroid not enlarged, symmetric, no tenderness/mass/nodules Back: symmetric, no curvature. ROM normal. No CVA tenderness. Lungs: clear to auscultation bilaterally Heart: regular rate and rhythm, S1, S2 normal, no murmur, click, rub or gallop Abdomen: soft, non-tender; bowel sounds normal; no masses,  no organomegaly Pulses: 2+ and symmetric Skin: Skin color, texture, turgor normal. No rashes or lesions Lymph nodes: Cervical, supraclavicular, and axillary nodes normal.  Assessment and Plan:  AVM (arteriovenous malformation) brain S/p embolization by [name] jan 6th . 3 weeks. No computer work. Headaches have resolved. Blood pressure issues have also improved. We'll continue amlodipine for now given patient reports that there was a small aneurysm behind the AV malformation.  Spinal stenosis of lumbar region She has seen [facility] physiatrist Naaman Plummer who is confident that he will be able to improve her symptomatology. She was very impressed with her evaluation by him and is planning on having facet joint injections.continue Vicodin for now.    Leukopenia Given her history of ulcerative colitis and multiple unexplained symptoms at the time of discovery, Center Dr. Orlie Dakin for evaluation. He agreed that there was no underlying myelodysplastic disorder. Patient will have repeat CBC in one month and then quarterly. If her absolute neutrophil count were to go below 1.0 we would refer back to Dr. Orlie Dakin.   Updated Medication List Outpatient  Encounter Prescriptions as of 03/05/2012  Medication Sig Dispense Refill  . amLODipine (NORVASC) 5 MG tablet Take 1 tablet (5 mg total) by mouth daily.  90 tablet  3  . aspirin 81 MG tablet Take 81 mg by mouth daily.      Marland Kitchen buPROPion (WELLBUTRIN XL) 300 MG 24 hr tablet Take 300 mg by mouth daily.      . mesalamine (CANASA) 1000 MG suppository Place 1,000 mg rectally at bedtime.      . fish oil-omega-3 fatty acids 1000 MG capsule Take 1 g by mouth daily.       . fluticasone (FLONASE) 50 MCG/ACT nasal spray       . gabapentin (NEURONTIN) 100 MG capsule Take 100-300 mg by mouth at bedtime as needed. For pain      . guaiFENesin (ROBITUSSIN) 100 MG/5ML liquid Take 200 mg by mouth 3 (three) times daily as needed. For cold symptoms      . HYDROcodone-acetaminophen (LORTAB  7.5) 7.5-500 MG per tablet Take 1 tablet by mouth every 6 (six) hours as needed for pain.  120 tablet  2  . methocarbamol (ROBAXIN) 750 MG tablet Take 750 mg by mouth at bedtime as needed. For muscle pain      . oxyCODONE-acetaminophen (PERCOCET/ROXICET) 5-325 MG per tablet Take 1 tablet by mouth every 6 (six) hours as needed. For pain      . propranolol (INDERAL) 10 MG tablet Take 10 mg by mouth daily as needed. for palpitations      . zolpidem (AMBIEN) 10 MG tablet Take 1 tablet (10 mg total) by mouth at bedtime as needed. For sleep  30 tablet  5  . [DISCONTINUED] zolpidem (AMBIEN) 10 MG tablet Take 10 mg by mouth at bedtime as needed. For sleep

## 2012-03-05 NOTE — Assessment & Plan Note (Signed)
Given her history of ulcerative colitis and multiple unexplained symptoms at the time of discovery, Center Dr. Orlie Dakin for evaluation. He agreed that there was no underlying myelodysplastic disorder. Patient will have repeat CBC in one month and then quarterly. If her absolute neutrophil count were to go below 1.0 we would refer back to Dr. Orlie Dakin.

## 2012-03-05 NOTE — Assessment & Plan Note (Signed)
S/p embolization by [name] jan 6th . 3 weeks. No computer work. Headaches have resolved. Blood pressure issues have also improved. We'll continue amlodipine for now given patient reports that there was a small aneurysm behind the AV malformation.

## 2012-03-11 ENCOUNTER — Ambulatory Visit (HOSPITAL_COMMUNITY)
Admit: 2012-03-11 | Discharge: 2012-03-11 | Disposition: A | Discharge status: Home / Self Care | Payer: Managed Care, Other (non HMO) | Attending: Interventional Radiology | Admitting: Interventional Radiology

## 2012-03-12 ENCOUNTER — Encounter: Payer: Self-pay | Admitting: Internal Medicine

## 2012-03-13 ENCOUNTER — Encounter: Payer: Self-pay | Admitting: Internal Medicine

## 2012-03-14 ENCOUNTER — Encounter: Payer: Self-pay | Admitting: Internal Medicine

## 2012-03-14 MED ORDER — DOXYCYCLINE HYCLATE 100 MG PO TABS: 100.0000 mg | ORAL_TABLET | Freq: Two times a day (BID) | ORAL | Status: DC

## 2012-03-15 ENCOUNTER — Encounter: Payer: Self-pay | Admitting: Internal Medicine

## 2012-03-19 ENCOUNTER — Encounter: Payer: Self-pay | Admitting: Internal Medicine

## 2012-03-19 DIAGNOSIS — D72819 Decreased white blood cell count, unspecified: Secondary | ICD-10-CM

## 2012-03-21 ENCOUNTER — Ambulatory Visit: Payer: Self-pay | Admitting: Gastroenterology

## 2012-03-21 LAB — HM COLONOSCOPY

## 2012-04-05 ENCOUNTER — Encounter: Payer: Self-pay | Admitting: Internal Medicine

## 2012-04-05 ENCOUNTER — Other Ambulatory Visit: Payer: Self-pay | Admitting: Internal Medicine

## 2012-04-06 MED ORDER — BUPROPION HCL ER (XL) 150 MG PO TB24: 150.0000 mg | ORAL_TABLET | Freq: Every day | ORAL | Status: DC

## 2012-04-06 MED ORDER — BUPROPION HCL ER (XL) 300 MG PO TB24: 300.0000 mg | ORAL_TABLET | Freq: Every day | ORAL | Status: DC

## 2012-04-12 ENCOUNTER — Encounter: Payer: Self-pay | Admitting: Internal Medicine

## 2012-04-12 DIAGNOSIS — D709 Neutropenia, unspecified: Secondary | ICD-10-CM

## 2012-04-13 ENCOUNTER — Emergency Department (HOSPITAL_COMMUNITY): Payer: Managed Care, Other (non HMO)

## 2012-04-13 ENCOUNTER — Emergency Department (HOSPITAL_COMMUNITY)
Admission: EM | Admit: 2012-04-13 | Discharge: 2012-04-14 | Disposition: A | Discharge status: Home Health | Payer: Managed Care, Other (non HMO) | Attending: Emergency Medicine | Admitting: Emergency Medicine

## 2012-04-13 ENCOUNTER — Encounter: Payer: Self-pay | Admitting: Internal Medicine

## 2012-04-13 ENCOUNTER — Encounter (HOSPITAL_COMMUNITY): Payer: Self-pay | Admitting: Emergency Medicine

## 2012-04-13 DIAGNOSIS — Z9861 Coronary angioplasty status: Secondary | ICD-10-CM | POA: Insufficient documentation

## 2012-04-13 DIAGNOSIS — Z8701 Personal history of pneumonia (recurrent): Secondary | ICD-10-CM | POA: Insufficient documentation

## 2012-04-13 DIAGNOSIS — R51 Headache: Secondary | ICD-10-CM | POA: Insufficient documentation

## 2012-04-13 DIAGNOSIS — M549 Dorsalgia, unspecified: Secondary | ICD-10-CM | POA: Insufficient documentation

## 2012-04-13 DIAGNOSIS — Z8739 Personal history of other diseases of the musculoskeletal system and connective tissue: Secondary | ICD-10-CM | POA: Insufficient documentation

## 2012-04-13 DIAGNOSIS — Z7982 Long term (current) use of aspirin: Secondary | ICD-10-CM | POA: Insufficient documentation

## 2012-04-13 DIAGNOSIS — F3289 Other specified depressive episodes: Secondary | ICD-10-CM | POA: Insufficient documentation

## 2012-04-13 DIAGNOSIS — Z8719 Personal history of other diseases of the digestive system: Secondary | ICD-10-CM | POA: Insufficient documentation

## 2012-04-13 DIAGNOSIS — R1013 Epigastric pain: Secondary | ICD-10-CM | POA: Insufficient documentation

## 2012-04-13 DIAGNOSIS — R197 Diarrhea, unspecified: Secondary | ICD-10-CM | POA: Insufficient documentation

## 2012-04-13 DIAGNOSIS — F411 Generalized anxiety disorder: Secondary | ICD-10-CM | POA: Insufficient documentation

## 2012-04-13 DIAGNOSIS — Z79899 Other long term (current) drug therapy: Secondary | ICD-10-CM | POA: Insufficient documentation

## 2012-04-13 DIAGNOSIS — Q283 Other malformations of cerebral vessels: Secondary | ICD-10-CM | POA: Insufficient documentation

## 2012-04-13 DIAGNOSIS — I10 Essential (primary) hypertension: Secondary | ICD-10-CM | POA: Insufficient documentation

## 2012-04-13 DIAGNOSIS — F329 Major depressive disorder, single episode, unspecified: Secondary | ICD-10-CM | POA: Insufficient documentation

## 2012-04-13 DIAGNOSIS — K2900 Acute gastritis without bleeding: Secondary | ICD-10-CM | POA: Insufficient documentation

## 2012-04-13 LAB — CBC WITH DIFFERENTIAL/PLATELET
Basophils Absolute: 0 10*3/uL (ref 0.0–0.1)
Basophils Relative: 0 % (ref 0–1)
Eosinophils Absolute: 0 10*3/uL (ref 0.0–0.7)
Eosinophils Relative: 0 % (ref 0–5)
HCT: 33.4 % — ABNORMAL LOW (ref 36.0–46.0)
Hemoglobin: 11.9 g/dL — ABNORMAL LOW (ref 12.0–15.0)
Lymphocytes Relative: 32 % (ref 12–46)
Lymphs Abs: 1.5 10*3/uL (ref 0.7–4.0)
MCH: 31.6 pg (ref 26.0–34.0)
MCHC: 35.6 g/dL (ref 30.0–36.0)
MCV: 88.6 fL (ref 78.0–100.0)
Monocytes Absolute: 0.5 10*3/uL (ref 0.1–1.0)
Monocytes Relative: 12 % (ref 3–12)
Neutro Abs: 2.6 10*3/uL (ref 1.7–7.7)
Neutrophils Relative %: 56 % (ref 43–77)
Platelets: 290 10*3/uL (ref 150–400)
RBC: 3.77 MIL/uL — ABNORMAL LOW (ref 3.87–5.11)
RDW: 11.9 % (ref 11.5–15.5)
WBC: 4.6 10*3/uL (ref 4.0–10.5)

## 2012-04-13 LAB — HEPATIC FUNCTION PANEL
ALT: 11 U/L (ref 0–35)
AST: 14 U/L (ref 0–37)
Albumin: 4.4 g/dL (ref 3.5–5.2)
Alkaline Phosphatase: 63 U/L (ref 39–117)
Bilirubin, Direct: <0.1 mg/dL (ref 0.0–0.3)
Total Bilirubin: 0.3 mg/dL (ref 0.3–1.2)
Total Protein: 7.1 g/dL (ref 6.0–8.3)

## 2012-04-13 LAB — POCT I-STAT, CHEM 8
BUN: 15 mg/dL (ref 6–23)
Calcium, Ion: 1.16 mmol/L (ref 1.12–1.23)
Chloride: 100 mEq/L (ref 96–112)
Creatinine, Ser: 1.1 mg/dL (ref 0.50–1.10)
Glucose, Bld: 88 mg/dL (ref 70–99)
HCT: 36 % (ref 36.0–46.0)
Hemoglobin: 12.2 g/dL (ref 12.0–15.0)
Potassium: 3.7 mEq/L (ref 3.5–5.1)
Sodium: 134 mEq/L — ABNORMAL LOW (ref 135–145)
TCO2: 25 mmol/L (ref 0–100)

## 2012-04-13 LAB — POCT I-STAT TROPONIN I: Troponin i, poc: 0 ng/mL (ref 0.00–0.08)

## 2012-04-13 LAB — LIPASE, BLOOD: Lipase: 48 U/L (ref 11–59)

## 2012-04-13 MED ORDER — METOCLOPRAMIDE HCL 5 MG/ML IJ SOLN
10.0000 mg | Freq: Once | INTRAMUSCULAR | Status: AC
  Administered 2012-04-13: 10 mg via INTRAVENOUS
  Filled 2012-04-13: qty 2

## 2012-04-13 MED ORDER — SODIUM CHLORIDE 0.9 % IV SOLN
Freq: Once | INTRAVENOUS | Status: AC
  Administered 2012-04-13: 19:00:00 via INTRAVENOUS

## 2012-04-13 MED ORDER — OMEPRAZOLE 20 MG PO CPDR: 20.0000 mg | DELAYED_RELEASE_CAPSULE | Freq: Every day | ORAL | Status: DC

## 2012-04-13 MED ORDER — DEXAMETHASONE SODIUM PHOSPHATE 4 MG/ML IJ SOLN
10.0000 mg | Freq: Once | INTRAMUSCULAR | Status: AC
  Administered 2012-04-13: 10 mg via INTRAVENOUS
  Filled 2012-04-13: qty 3

## 2012-04-13 MED ORDER — GI COCKTAIL ~~LOC~~
30.0000 mL | Freq: Once | ORAL | Status: AC
  Administered 2012-04-13: 30 mL via ORAL
  Filled 2012-04-13: qty 30

## 2012-04-13 MED ORDER — DIPHENHYDRAMINE HCL 50 MG/ML IJ SOLN
25.0000 mg | Freq: Once | INTRAMUSCULAR | Status: AC
  Administered 2012-04-13: 25 mg via INTRAVENOUS
  Filled 2012-04-13: qty 1

## 2012-04-13 NOTE — ED Notes (Signed)
Unable to obtain med from [facility]. Pharmacy notified

## 2012-04-13 NOTE — ED Notes (Signed)
Pt cont to c/o headache and burning in upper abd, request "GI cocktail"  Family member at bedside

## 2012-04-13 NOTE — ED Notes (Signed)
Pt states "I've had chest pain, goes into my right shoulder blade and into my back. I've also had indigestion and burping, and my stomach is bloating and tender on the right side." Pt states chest pain "moves all over and burns." Pt states she "I've been feeling strange and not well for the past 2 days." Denies sob, dizziness. Pt states she is also having diarrhea. Pt took antacid for pain, did not help.

## 2012-04-13 NOTE — ED Provider Notes (Signed)
History     CSN: [id number]  Arrival date & time 04/13/12  1714   First MD Initiated Contact with Patient 04/13/12 1733      Chief Complaint  Patient presents with  . Chest Pain    (Consider location/radiation/quality/duration/timing/severity/associated sxs/prior treatment) HPI Comments: Patient is a 57 year old woman who said she didn't feel well today she has had a headache. She began to feel bloated in her abdomen and developed a severe pain in her right chest that went through to the back. It also went to her right shoulder blade. There was no fever. She did have some diarrhea. The pain has a burning character. She tried antacid and a Pepcid without relief. She also tried milk of magnesia without relief. She has no history of heart trouble, but there is a family history of gallbladder disease. She thinks she is having a gallbladder attack.  Patient is a 57 y.o. female presenting with chest pain. The history is provided by the patient. No language interpreter was used.  Chest Pain Pain location:  R chest Pain quality: burning   Pain radiates to:  R shoulder Pain radiates to the back: yes   Pain severity:  Moderate Onset quality:  Gradual Duration:  4 hours Timing:  Constant Progression:  Worsening Chronicity:  New Relieved by:  Nothing Worsened by:  Nothing tried Ineffective treatments:  Antacids (Pepcid) Associated symptoms: abdominal pain and back pain   Associated symptoms: no fever   Risk factors: no coronary artery disease     Past Medical History  Diagnosis Date  . Hypertension   . Depression   . Degenerative joint disease involving multiple joints   . Spinal stenosis of lumbar region   . Anxiety   . Dizziness   . Headache     having since  past month and a half  . Pneumonia     approx 6 years ago  . Ulcerative proctitis   . AVM (arteriovenous malformation) brain Dec 2013    s/p embolization  Feb 26 2012, [name]    Past Surgical History  Procedure  Laterality Date  . Abdominal hysterectomy    . Oophorectomy    . Shoulder arthroscopy with rotator cuff repair and subacromial decompression  2007    left  . Nasal sinus surgery    . Appendectomy    . Cardiac catheterization      normal coronaries, no wall motion abnormalities 11/02/09 [facility])  . Radiology with anesthesia  02/26/2012    Procedure: RADIOLOGY WITH ANESTHESIA;  Surgeon: Oneal Grout, MD;  Location: [facility] OR;  Service: Radiology;  Laterality: N/A;  . Av fistula repair    . Tonsillectomy  adnoids    Family History  Problem Relation Age of Onset  . Cancer Father 54    colon    History  Substance Use Topics  . Smoking status: Never Smoker   . Smokeless tobacco: Never Used  . Alcohol Use: 1.2 oz/week    2 Glasses of wine per week     Comment: 2-3 week with dinner    OB History   Grav Para Term Preterm Abortions TAB SAB Ect Mult Living                  Review of Systems  Constitutional: Negative for fever and chills.  HENT: Negative.   Eyes: Negative.   Respiratory: Negative.   Cardiovascular: Positive for chest pain.  Gastrointestinal: Positive for abdominal pain.  Genitourinary: Negative.   Musculoskeletal: Positive  for back pain.  Skin: Negative.   Neurological: Negative.   Psychiatric/Behavioral: Negative.     Allergies  Adhesive  Home Medications   Current Outpatient Rx  Name  Route  Sig  Dispense  Refill  . amLODipine (NORVASC) 5 MG tablet   Oral   Take 1 tablet (5 mg total) by mouth daily.   90 tablet   3   . aspirin 81 MG tablet   Oral   Take 81 mg by mouth daily.         Marland Kitchen buPROPion (WELLBUTRIN XL) 150 MG 24 hr tablet   Oral   Take 1 tablet (150 mg total) by mouth daily.   30 tablet   5   . buPROPion (WELLBUTRIN XL) 300 MG 24 hr tablet   Oral   Take 1 tablet (300 mg total) by mouth daily.   30 tablet   5     Patient's total daily dose is 450 mg   . doxycycline (VIBRA-TABS) 100 MG tablet   Oral   Take 1 tablet (100  mg total) by mouth 2 (two) times daily.   14 tablet   0   . fish oil-omega-3 fatty acids 1000 MG capsule   Oral   Take 1 g by mouth daily.          . fluticasone (FLONASE) 50 MCG/ACT nasal spray               . gabapentin (NEURONTIN) 100 MG capsule   Oral   Take 100-300 mg by mouth at bedtime as needed. For pain         . guaiFENesin (ROBITUSSIN) 100 MG/5ML liquid   Oral   Take 200 mg by mouth 3 (three) times daily as needed. For cold symptoms         . HYDROcodone-acetaminophen (LORTAB 7.5) 7.5-500 MG per tablet   Oral   Take 1 tablet by mouth every 6 (six) hours as needed for pain.   120 tablet   2   . mesalamine (CANASA) 1000 MG suppository   Rectal   Place 1,000 mg rectally at bedtime.         . methocarbamol (ROBAXIN) 750 MG tablet   Oral   Take 750 mg by mouth at bedtime as needed. For muscle pain         . oxyCODONE-acetaminophen (PERCOCET/ROXICET) 5-325 MG per tablet   Oral   Take 1 tablet by mouth every 6 (six) hours as needed. For pain         . propranolol (INDERAL) 10 MG tablet   Oral   Take 10 mg by mouth daily as needed. for palpitations         . zolpidem (AMBIEN) 10 MG tablet   Oral   Take 1 tablet (10 mg total) by mouth at bedtime as needed. For sleep   30 tablet   5     BP 139/85  Pulse 78  Temp(Src) 97.7 F (36.5 C) (Oral)  Resp 18  SpO2 97%  Physical Exam  Nursing note and vitals reviewed. Constitutional: She is oriented to person, place, and time. She appears well-developed and well-nourished. Distressed: in mild to moderate distress, complaining of epigastric and right chest pain, as well as headache.  HENT:  Head: Normocephalic and atraumatic.  Right Ear: External ear normal.  Left Ear: External ear normal.  Mouth/Throat: Oropharynx is clear and moist.  Eyes: Conjunctivae and EOM are normal. Pupils are equal, round, and reactive to  light.  Neck: Normal range of motion. Neck supple.  Cardiovascular: Normal rate,  regular rhythm and normal heart sounds.   Pulmonary/Chest: Effort normal and breath sounds normal.  Abdominal: Soft. Tenderness: epigastric tenderness no mass rebound or rigidity.  Musculoskeletal: Normal range of motion. She exhibits no edema and no tenderness.  Neurological: She is alert and oriented to person, place, and time.  No sensory or motor deficit.  Skin: Skin is warm and dry.  Psychiatric: She has a normal mood and affect. Her behavior is normal.    ED Course  Procedures (including critical care time)  Labs Reviewed  CBC WITH DIFFERENTIAL - Abnormal; Notable for the following:    RBC 3.77 (*)    Hemoglobin 11.9 (*)    HCT 33.4 (*)    All other components within normal limits  POCT I-STAT, CHEM 8 - Abnormal; Notable for the following:    Sodium 134 (*)    All other components within normal limits  LIPASE, BLOOD  POCT I-STAT TROPONIN I    Date: 04/13/2012  Rate: 78  Rhythm: normal sinus rhythm  QRS Axis: normal  Intervals: normal  ST/T Wave abnormalities: normal  Conduction Disutrbances:none  Narrative Interpretation: Normal EKG  Old EKG Reviewed: none available  6:41 PM Results for orders placed during the hospital encounter of 04/13/12  CBC WITH DIFFERENTIAL      Result Value Range   WBC 4.6  4.0 - 10.5 K/uL   RBC 3.77 (*) 3.87 - 5.11 MIL/uL   Hemoglobin 11.9 (*) 12.0 - 15.0 g/dL   HCT 78.2 (*) 95.6 - 21.3 %   MCV 88.6  78.0 - 100.0 fL   MCH 31.6  26.0 - 34.0 pg   MCHC 35.6  30.0 - 36.0 g/dL   RDW 08.6  57.8 - 46.9 %   Platelets 290  150 - 400 K/uL   Neutrophils Relative 56  43 - 77 %   Neutro Abs 2.6  1.7 - 7.7 K/uL   Lymphocytes Relative 32  12 - 46 %   Lymphs Abs 1.5  0.7 - 4.0 K/uL   Monocytes Relative 12  3 - 12 %   Monocytes Absolute 0.5  0.1 - 1.0 K/uL   Eosinophils Relative 0  0 - 5 %   Eosinophils Absolute 0.0  0.0 - 0.7 K/uL   Basophils Relative 0  0 - 1 %   Basophils Absolute 0.0  0.0 - 0.1 K/uL  LIPASE, BLOOD      Result Value Range    Lipase 48  11 - 59 U/L  POCT I-STAT, CHEM 8      Result Value Range   Sodium 134 (*) 135 - 145 mEq/L   Potassium 3.7  3.5 - 5.1 mEq/L   Chloride 100  96 - 112 mEq/L   BUN 15  6 - 23 mg/dL   Creatinine, Ser 6.29  0.50 - 1.10 mg/dL   Glucose, Bld 88  70 - 99 mg/dL   Calcium, Ion 5.28  4.13 - 1.23 mmol/L   TCO2 25  0 - 100 mmol/L   Hemoglobin 12.2  12.0 - 15.0 g/dL   HCT 24.4  01.0 - 27.2 %  POCT I-STAT TROPONIN I      Result Value Range   Troponin i, poc 0.00  0.00 - 0.08 ng/mL   Comment 3            Dg Chest 2 View  04/13/2012  *RADIOLOGY REPORT*  Clinical Data: Chest  pain.  CHEST - 2 VIEW  Comparison: Chest x-ray 02/23/2012.  Findings: Lung volumes are normal.  No consolidative airspace disease.  No pleural effusions.  No pneumothorax.  No pulmonary nodule or mass noted.  Pulmonary vasculature and the cardiomediastinal silhouette are within normal limits.  Multiple old healed right-sided rib fractures.  IMPRESSION: 1. No radiographic evidence of acute cardiopulmonary disease.   Original Report Authenticated By: Trudie Reed, M.D.     Lab tests essentially normal so far.  Added hepatic function panel.  Waiting for abdominal ultrasound.  Results so far shared with pt and her husband.  She complains of headache, will give migraine cocktail of Reglan, dexamethasone and Benadryl.  8:21 PM Prolonged wait for ultrasound.  11:44 PM Ultrasound showed no gallstones.  Her symptoms seem most likely related to peptic ulcer or hiatal hernia.  Recommended omeprazole 20 mg bid, antacids pc and hs, and GI followup with her gastroenterologist at the [facility] in [place].   1. Acute gastritis          Carleene Cooper III, MD 04/13/12 [phone]

## 2012-04-13 NOTE — ED Notes (Addendum)
Pt c/o chest pain. Reports pain began today at right shoulder radiating across to left shoulder and around back. Pt c/o being bloated under right rib cage and had diarrhea today.

## 2012-04-16 ENCOUNTER — Encounter: Payer: Self-pay | Admitting: Adult Health

## 2012-04-16 ENCOUNTER — Ambulatory Visit (INDEPENDENT_AMBULATORY_CARE_PROVIDER_SITE_OTHER): Payer: Managed Care, Other (non HMO) | Admitting: Adult Health

## 2012-04-16 VITALS — BP 132/88 | HR 66 | Temp 98.3°F | Resp 14 | Wt 122.0 lb

## 2012-04-16 DIAGNOSIS — J3489 Other specified disorders of nose and nasal sinuses: Secondary | ICD-10-CM

## 2012-04-16 DIAGNOSIS — J0110 Acute frontal sinusitis, unspecified: Secondary | ICD-10-CM | POA: Insufficient documentation

## 2012-04-16 MED ORDER — AMOXICILLIN 875 MG PO TABS: 875.0000 mg | ORAL_TABLET | Freq: Two times a day (BID) | ORAL | Status: DC

## 2012-04-16 NOTE — Assessment & Plan Note (Signed)
We will try Dimetapp for her symptoms. I am concerned that she may be developing a right otitis media. Will treat empirically with amoxicillin.

## 2012-04-16 NOTE — Patient Instructions (Signed)
  Please starting her antibiotics today.  You can try Dimetapp elixir which may help with symptoms of congestion without the side effects you had experienced with other decongestants.  Please return to office if her symptoms do not improve within 3-4 days.

## 2012-04-16 NOTE — Progress Notes (Signed)
  Subjective:    Patient ID: Kaveri Perras, female    DOB: 07-31-1955, 57 y.o.   MRN: [id number]  HPI  Patient is a pleasant 57 year old female who presents with sinus headaches, postnasal drip, right ear pain, pressure behind bilateral eyes and congestion. She denies fever or chills. Symptoms have been ongoing for 3 days. She is unable to take decongestants secondary to feeling jittery. Therefore she has not tried anything over-the-counter. She has tried Flonase nasal spray but she does not feel that this is helping.   Current Outpatient Prescriptions on File Prior to Visit  Medication Sig Dispense Refill  . amLODipine (NORVASC) 5 MG tablet Take 1 tablet (5 mg total) by mouth daily.  90 tablet  3  . aspirin 81 MG tablet Take 81 mg by mouth daily.      Marland Kitchen buPROPion (WELLBUTRIN XL) 300 MG 24 hr tablet Take 300 mg by mouth every morning.      Marland Kitchen HYDROcodone-acetaminophen (LORTAB) 7.5-500 MG per tablet Take 1 tablet by mouth every 6 (six) hours as needed for pain. For pain      . zolpidem (AMBIEN) 10 MG tablet Take 10 mg by mouth at bedtime as needed. For sleep      . buPROPion (WELLBUTRIN XL) 150 MG 24 hr tablet Take 150 mg by mouth every morning.      . Cyanocobalamin (VITAMIN B-12 PO) Take 1 tablet by mouth daily.      Marland Kitchen omeprazole (PRILOSEC) 20 MG capsule Take 1 capsule (20 mg total) by mouth daily.  60 capsule  0  . [DISCONTINUED] ferrous sulfate 325 (65 FE) MG EC tablet Take 1 tablet (325 mg total) by mouth daily after breakfast.  30 tablet  3  . [DISCONTINUED] metFORMIN (GLUCOPHAGE) 500 MG tablet Take 500 mg by mouth 2 (two) times daily with a meal.         No current facility-administered medications on file prior to visit.     Review of Systems  Constitutional: Positive for chills. Negative for fever.  HENT: Positive for ear pain, congestion, sore throat, rhinorrhea, postnasal drip and sinus pressure.   Eyes:       Pressure behind eyes  Respiratory: Negative for cough, chest  tightness, shortness of breath and wheezing.   Cardiovascular: Negative for chest pain.  Gastrointestinal: Positive for nausea. Negative for vomiting.  Neurological: Positive for headaches. Negative for dizziness, syncope and light-headedness.    BP 132/88  Pulse 66  Temp(Src) 98.3 F (36.8 C) (Oral)  Resp 14  Wt 122 lb (55.339 kg)  BMI 20.3 kg/m2  SpO2 98%     Objective:   Physical Exam  Constitutional: She is oriented to person, place, and time. She appears well-developed and well-nourished. No distress.  HENT:  Head: Normocephalic and atraumatic.  Left Ear: External ear normal.  Pharyngeal erythema. Right ear with slight inflammation. TM WNL.  Cardiovascular: Normal rate, regular rhythm and normal heart sounds.   Pulmonary/Chest: Effort normal and breath sounds normal. No respiratory distress. She has no wheezes.  Lymphadenopathy:    She has no cervical adenopathy.  Neurological: She is alert and oriented to person, place, and time.  Skin: Skin is warm and dry.  Psychiatric: She has a normal mood and affect. Her behavior is normal. Judgment and thought content normal.          Assessment & Plan:

## 2012-04-18 ENCOUNTER — Ambulatory Visit: Payer: Self-pay | Admitting: Gastroenterology

## 2012-04-18 LAB — KOH PREP

## 2012-04-19 ENCOUNTER — Ambulatory Visit: Payer: Managed Care, Other (non HMO) | Admitting: Internal Medicine

## 2012-04-19 ENCOUNTER — Ambulatory Visit: Payer: Self-pay | Admitting: Gastroenterology

## 2012-04-19 LAB — PATHOLOGY REPORT

## 2012-04-22 ENCOUNTER — Ambulatory Visit: Payer: Managed Care, Other (non HMO) | Admitting: Internal Medicine

## 2012-04-28 ENCOUNTER — Encounter: Payer: Self-pay | Admitting: Internal Medicine

## 2012-04-30 ENCOUNTER — Other Ambulatory Visit: Payer: Self-pay | Admitting: *Deleted

## 2012-05-01 ENCOUNTER — Encounter: Payer: Self-pay | Admitting: Internal Medicine

## 2012-05-01 DIAGNOSIS — M4802 Spinal stenosis, cervical region: Secondary | ICD-10-CM

## 2012-05-01 MED ORDER — HYDROCODONE-ACETAMINOPHEN 10-325 MG PO TABS: 1.0000 | ORAL_TABLET | Freq: Three times a day (TID) | ORAL | Status: DC | PRN

## 2012-05-01 NOTE — Telephone Encounter (Signed)
Plaese send to pharmacy

## 2012-05-08 ENCOUNTER — Encounter: Payer: Self-pay | Admitting: Internal Medicine

## 2012-05-08 ENCOUNTER — Telehealth: Payer: Self-pay | Admitting: *Deleted

## 2012-05-08 NOTE — Telephone Encounter (Signed)
[facility] pharmacy called to check dose on patient's Norco. Gave pharmacy info how it is listed in med list. They said that it was called in with different instructions.

## 2012-05-08 NOTE — Telephone Encounter (Signed)
Medication recalled in this afternoon.

## 2012-05-13 ENCOUNTER — Other Ambulatory Visit: Payer: Self-pay | Admitting: General Practice

## 2012-05-13 DIAGNOSIS — M4802 Spinal stenosis, cervical region: Secondary | ICD-10-CM

## 2012-05-13 MED ORDER — HYDROCODONE-ACETAMINOPHEN 10-325 MG PO TABS: 1.0000 | ORAL_TABLET | ORAL | Status: DC | PRN

## 2012-06-05 ENCOUNTER — Other Ambulatory Visit (HOSPITAL_COMMUNITY): Payer: Self-pay | Admitting: Interventional Radiology

## 2012-06-05 DIAGNOSIS — I770 Arteriovenous fistula, acquired: Secondary | ICD-10-CM

## 2012-06-14 ENCOUNTER — Encounter: Payer: Self-pay | Admitting: Internal Medicine

## 2012-06-14 DIAGNOSIS — L719 Rosacea, unspecified: Secondary | ICD-10-CM

## 2012-06-15 MED ORDER — DOXYCYCLINE HYCLATE 100 MG PO TABS: ORAL_TABLET | ORAL | Status: DC

## 2012-06-17 ENCOUNTER — Telehealth: Payer: Self-pay | Admitting: Internal Medicine

## 2012-06-17 NOTE — Telephone Encounter (Signed)
Called patient to set appointment patient stated she does not need an appointment.

## 2012-06-17 NOTE — Telephone Encounter (Signed)
Appointment Request From: [name]      With Provider: Duncan Dull, MD [facility] [place] [facility]]      Preferred Date Range: Any      Preferred Times: Any   See below my chart message   Reason: To address the following health maintenance concerns.   Colonoscopy      Comments:   I've tried to update your records several times:   Colonoscopy was performed 03/21/12 by Dr.Oh Almon Register)   Pap Smear not due until 9/14 - total abdominal hysterectomy (Dr. Darrick Huntsman performs q 2 years)   Endoscopy: 04/18/12   Hydascan: 04/19/12      Please let me know if you need additional information

## 2012-06-20 ENCOUNTER — Encounter: Payer: Self-pay | Admitting: Internal Medicine

## 2012-06-28 ENCOUNTER — Other Ambulatory Visit: Payer: Self-pay | Admitting: Radiology

## 2012-07-03 ENCOUNTER — Other Ambulatory Visit: Payer: Self-pay | Admitting: Internal Medicine

## 2012-07-03 NOTE — Telephone Encounter (Signed)
Patient seen in office 2/14 Labs 5/14 please advise as to refill.

## 2012-07-04 ENCOUNTER — Other Ambulatory Visit: Payer: Self-pay | Admitting: Radiology

## 2012-07-08 ENCOUNTER — Encounter: Payer: Self-pay | Admitting: Internal Medicine

## 2012-07-08 DIAGNOSIS — I10 Essential (primary) hypertension: Secondary | ICD-10-CM

## 2012-07-08 DIAGNOSIS — E785 Hyperlipidemia, unspecified: Secondary | ICD-10-CM

## 2012-07-08 DIAGNOSIS — D702 Other drug-induced agranulocytosis: Secondary | ICD-10-CM

## 2012-07-08 DIAGNOSIS — R5381 Other malaise: Secondary | ICD-10-CM

## 2012-07-09 ENCOUNTER — Encounter: Payer: Self-pay | Admitting: Internal Medicine

## 2012-07-09 ENCOUNTER — Encounter (HOSPITAL_COMMUNITY): Payer: Self-pay | Admitting: Pharmacy Technician

## 2012-07-10 ENCOUNTER — Other Ambulatory Visit: Payer: Self-pay | Admitting: Radiology

## 2012-07-10 MED ORDER — LEVOFLOXACIN 500 MG PO TABS: 500.0000 mg | ORAL_TABLET | Freq: Every day | ORAL | Status: DC

## 2012-07-12 ENCOUNTER — Ambulatory Visit (HOSPITAL_COMMUNITY)
Admission: RE | Admit: 2012-07-12 | Discharge: 2012-07-12 | Disposition: A | Discharge status: Home / Self Care | Payer: Managed Care, Other (non HMO) | Source: Ambulatory Visit | Attending: Interventional Radiology | Admitting: Interventional Radiology

## 2012-07-12 ENCOUNTER — Encounter (HOSPITAL_COMMUNITY): Payer: Self-pay

## 2012-07-12 DIAGNOSIS — Z9109 Other allergy status, other than to drugs and biological substances: Secondary | ICD-10-CM | POA: Insufficient documentation

## 2012-07-12 DIAGNOSIS — Z885 Allergy status to narcotic agent status: Secondary | ICD-10-CM | POA: Insufficient documentation

## 2012-07-12 DIAGNOSIS — I770 Arteriovenous fistula, acquired: Secondary | ICD-10-CM

## 2012-07-12 DIAGNOSIS — M1990 Unspecified osteoarthritis, unspecified site: Secondary | ICD-10-CM | POA: Insufficient documentation

## 2012-07-12 DIAGNOSIS — M48061 Spinal stenosis, lumbar region without neurogenic claudication: Secondary | ICD-10-CM | POA: Insufficient documentation

## 2012-07-12 DIAGNOSIS — F411 Generalized anxiety disorder: Secondary | ICD-10-CM | POA: Insufficient documentation

## 2012-07-12 DIAGNOSIS — I10 Essential (primary) hypertension: Secondary | ICD-10-CM | POA: Insufficient documentation

## 2012-07-12 DIAGNOSIS — Z8701 Personal history of pneumonia (recurrent): Secondary | ICD-10-CM | POA: Insufficient documentation

## 2012-07-12 DIAGNOSIS — Z9889 Other specified postprocedural states: Secondary | ICD-10-CM | POA: Insufficient documentation

## 2012-07-12 DIAGNOSIS — K5120 Ulcerative (chronic) proctitis without complications: Secondary | ICD-10-CM | POA: Insufficient documentation

## 2012-07-12 DIAGNOSIS — Q283 Other malformations of cerebral vessels: Secondary | ICD-10-CM | POA: Insufficient documentation

## 2012-07-12 DIAGNOSIS — F3289 Other specified depressive episodes: Secondary | ICD-10-CM | POA: Insufficient documentation

## 2012-07-12 DIAGNOSIS — F329 Major depressive disorder, single episode, unspecified: Secondary | ICD-10-CM | POA: Insufficient documentation

## 2012-07-12 LAB — CBC WITH DIFFERENTIAL/PLATELET
Basophils Absolute: 0 10*3/uL (ref 0.0–0.1)
Basophils Relative: 0 % (ref 0–1)
Eosinophils Absolute: 0 10*3/uL (ref 0.0–0.7)
Eosinophils Relative: 1 % (ref 0–5)
HCT: 34.2 % — ABNORMAL LOW (ref 36.0–46.0)
Hemoglobin: 11.9 g/dL — ABNORMAL LOW (ref 12.0–15.0)
Lymphocytes Relative: 45 % (ref 12–46)
Lymphs Abs: 1.5 10*3/uL (ref 0.7–4.0)
MCH: 31 pg (ref 26.0–34.0)
MCHC: 34.8 g/dL (ref 30.0–36.0)
MCV: 89.1 fL (ref 78.0–100.0)
Monocytes Absolute: 0.7 10*3/uL (ref 0.1–1.0)
Monocytes Relative: 22 % — ABNORMAL HIGH (ref 3–12)
Neutro Abs: 1.1 10*3/uL — ABNORMAL LOW (ref 1.7–7.7)
Neutrophils Relative %: 32 % — ABNORMAL LOW (ref 43–77)
Platelets: 257 10*3/uL (ref 150–400)
RBC: 3.84 MIL/uL — ABNORMAL LOW (ref 3.87–5.11)
RDW: 12.2 % (ref 11.5–15.5)
WBC: 3.3 10*3/uL — ABNORMAL LOW (ref 4.0–10.5)

## 2012-07-12 LAB — PROTIME-INR
INR: 0.95 (ref 0.00–1.49)
Prothrombin Time: 12.6 seconds (ref 11.6–15.2)

## 2012-07-12 LAB — BASIC METABOLIC PANEL
BUN: 18 mg/dL (ref 6–23)
CO2: 24 mEq/L (ref 19–32)
Calcium: 9.2 mg/dL (ref 8.4–10.5)
Chloride: 102 mEq/L (ref 96–112)
Creatinine, Ser: 1.17 mg/dL — ABNORMAL HIGH (ref 0.50–1.10)
GFR calc Af Amer: 59 mL/min — ABNORMAL LOW (ref >90)
GFR calc non Af Amer: 51 mL/min — ABNORMAL LOW (ref >90)
Glucose, Bld: 94 mg/dL (ref 70–99)
Potassium: 3.8 mEq/L (ref 3.5–5.1)
Sodium: 137 mEq/L (ref 135–145)

## 2012-07-12 LAB — APTT: aPTT: 37 seconds (ref 24–37)

## 2012-07-12 MED ORDER — SODIUM CHLORIDE 0.9 % IV SOLN
INTRAVENOUS | Status: AC | PRN
  Administered 2012-07-12: 100 mL/h via INTRAVENOUS

## 2012-07-12 MED ORDER — SODIUM CHLORIDE 0.9 % IV SOLN: INTRAVENOUS | Status: AC

## 2012-07-12 MED ORDER — IOHEXOL 300 MG/ML  SOLN
150.0000 mL | Freq: Once | INTRAMUSCULAR | Status: AC | PRN
  Administered 2012-07-12: 35 mL via INTRA_ARTERIAL

## 2012-07-12 MED ORDER — HEPARIN SOD (PORK) LOCK FLUSH 100 UNIT/ML IV SOLN
INTRAVENOUS | Status: AC | PRN
  Administered 2012-07-12: 500 [IU] via INTRAVENOUS

## 2012-07-12 MED ORDER — MIDAZOLAM HCL 2 MG/2ML IJ SOLN
INTRAMUSCULAR | Status: AC | PRN
  Administered 2012-07-12: 1 mg via INTRAVENOUS
  Administered 2012-07-12: 0.5 mg via INTRAVENOUS

## 2012-07-12 MED ORDER — SODIUM CHLORIDE 0.9 % IV SOLN
Freq: Once | INTRAVENOUS | Status: AC
  Administered 2012-07-12: 08:00:00 via INTRAVENOUS

## 2012-07-12 MED ORDER — FENTANYL CITRATE 0.05 MG/ML IJ SOLN
INTRAMUSCULAR | Status: AC | PRN
  Administered 2012-07-12: 25 ug via INTRAVENOUS
  Administered 2012-07-12: 12.5 ug via INTRAVENOUS

## 2012-07-12 MED ORDER — FENTANYL CITRATE 0.05 MG/ML IJ SOLN
INTRAMUSCULAR | Status: AC
  Filled 2012-07-12: qty 2

## 2012-07-12 MED ORDER — MIDAZOLAM HCL 2 MG/2ML IJ SOLN
INTRAMUSCULAR | Status: AC
  Filled 2012-07-12: qty 4

## 2012-07-12 NOTE — Procedures (Signed)
S/P bilateral common carotid arteriograms. RT CFA approach Findings. 1Interval sig decrease in size of residual RT deep parietal subcortical AVM

## 2012-07-12 NOTE — H&P (Signed)
HPI: Adrienne Robinson is an 57 y.o. 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 She did well post procedure and was seen in follow up. She is now scheduled for follow up cerebral angiogram today Pt feels well otherwise. No recent illness, fevers. PMHx and meds reviewed.  Past Medical History:  Past Medical History  Diagnosis Date  . Hypertension   . Depression   . Degenerative joint disease involving multiple joints   . Spinal stenosis of lumbar region   . Anxiety   . Dizziness   . Headache     having since  past month and a half  . Pneumonia     approx 6 years ago  . Ulcerative proctitis   . 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 Abdominal hysterectomy    . Oophorectomy    . Shoulder arthroscopy with rotator cuff repair and subacromial decompression  2007    left  . Nasal sinus surgery    . Appendectomy    . Cardiac catheterization      normal coronaries, no wall motion abnormalities 11/02/09 [facility])  . Radiology with anesthesia  02/26/2012    Procedure: RADIOLOGY WITH ANESTHESIA;  Surgeon: Oneal Grout, MD;  Location: [facility] OR;  Service: Radiology;  Laterality: N/A;  . Av fistula repair    . Tonsillectomy  adnoids    Family History:  Family History  Problem Relation Age of Onset  . Cancer Father 62    colon    Social History:  reports that she has never smoked. She has never used smokeless tobacco. She reports that she drinks about 1.2 ounces of alcohol per week. She reports that she does not use illicit drugs.  Allergies:  Allergies  Allergen Reactions  . Morphine And Related Rash  . Adhesive (Tape) Rash and Other (See Comments)    reddness    Medications: amLODipine (NORVASC) 5 MG tablet (Taking) 90 tablet 3 01/22/2012 01/21/2013 Sig - Route: Take 1 tablet (5 mg total) by mouth daily. - Oral Number of times  this order has been changed since signing: 1 Order [address] buPROPion (WELLBUTRIN XL) 150 MG 24 hr tablet (Taking) 04/06/2012 Sig - Route: Take 150 mg by mouth every morning. Takes with a Wellbutrin XL 300 mg to total 450 mg daily - Oral Class: Historical Med Number of times this order has been changed since signing: 1 Order [address] buPROPion (WELLBUTRIN XL) 300 MG 24 hr tablet (Taking) 04/06/2012 Sig - Route: Take 300 mg by mouth every morning. Takes with a Wellbutrin XL 150 to total 450 mg daily - Oral Class: Historical Med Number of times this order has been changed since signing: 1 Order [address] omeprazole (PRILOSEC) 20 MG capsule (Taking) 60 capsule 0 04/13/2012 Sig - Route: Take 1 capsule (20 mg total) by mouth daily. - Oral Class: Print Number of times this order has been changed since signing: 1 Order [address] zolpidem (AMBIEN) 10 MG tablet (Taking) 03/05/2012 Sig - Route: Take 10 mg by mouth at bedtime as needed for sleep   Please HPI for pertinent positives, otherwise complete 10 system ROS negative.  Physical Exam: BP 137/85  Pulse 67  Temp(Src) 98.1 F (36.7 C) (Oral)  Resp 18  Ht 5\' 6"  (1.676 m)  Wt 122 lb (55.339 kg)  BMI 19.7 kg/m2  SpO2 99% Body mass index is 19.7 kg/(m^2).   General Appearance:  Alert, cooperative, no distress, appears stated age  Head:  Normocephalic, without obvious abnormality, atraumatic  ENT: Unremarkable  Neck: Supple, symmetrical, trachea midline  Lungs:   Clear to auscultation bilaterally, no w/r/r, respirations unlabored without use of accessory muscles.  Chest Wall:  No tenderness or deformity  Heart:  Regular rate and rhythm, S1, S2 normal, no murmur, rub or gallop.  Abdomen:   Soft, non-tender, non distended.  Extremities: Extremities normal, atraumatic, no cyanosis or edema  Pulses: 2+ and symmetric  Neurologic: Normal affect, no gross deficits.   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 Reviewed procedure, risks, complications, use of sedation Labs pending Consent signed in chart  Brayton El PA-C 07/12/2012, 7:55 AM

## 2012-07-16 ENCOUNTER — Encounter: Payer: Self-pay | Admitting: Internal Medicine

## 2012-07-16 ENCOUNTER — Ambulatory Visit (INDEPENDENT_AMBULATORY_CARE_PROVIDER_SITE_OTHER): Payer: Managed Care, Other (non HMO) | Admitting: Internal Medicine

## 2012-07-16 VITALS — BP 142/84 | HR 112 | Temp 98.6°F | Resp 16 | Wt 125.2 lb

## 2012-07-16 DIAGNOSIS — R5381 Other malaise: Secondary | ICD-10-CM

## 2012-07-16 DIAGNOSIS — R590 Localized enlarged lymph nodes: Secondary | ICD-10-CM

## 2012-07-16 DIAGNOSIS — R599 Enlarged lymph nodes, unspecified: Secondary | ICD-10-CM

## 2012-07-16 DIAGNOSIS — J019 Acute sinusitis, unspecified: Secondary | ICD-10-CM

## 2012-07-16 DIAGNOSIS — R002 Palpitations: Secondary | ICD-10-CM

## 2012-07-16 DIAGNOSIS — D72819 Decreased white blood cell count, unspecified: Secondary | ICD-10-CM

## 2012-07-16 DIAGNOSIS — Q282 Arteriovenous malformation of cerebral vessels: Secondary | ICD-10-CM

## 2012-07-16 DIAGNOSIS — L299 Pruritus, unspecified: Secondary | ICD-10-CM | POA: Insufficient documentation

## 2012-07-16 DIAGNOSIS — Q283 Other malformations of cerebral vessels: Secondary | ICD-10-CM

## 2012-07-16 DIAGNOSIS — R5383 Other fatigue: Secondary | ICD-10-CM

## 2012-07-16 DIAGNOSIS — E785 Hyperlipidemia, unspecified: Secondary | ICD-10-CM

## 2012-07-16 LAB — SEDIMENTATION RATE: Sed Rate: 15 mm/hr (ref 0–22)

## 2012-07-16 LAB — LIPID PANEL
Cholesterol: 198 mg/dL (ref 0–200)
HDL: 62 mg/dL (ref >39.00)
LDL Cholesterol: 122 mg/dL — ABNORMAL HIGH (ref 0–99)
Total CHOL/HDL Ratio: 3
Triglycerides: 71 mg/dL (ref 0.0–149.0)
VLDL: 14.2 mg/dL (ref 0.0–40.0)

## 2012-07-16 LAB — COMPREHENSIVE METABOLIC PANEL
ALT: 15 U/L (ref 0–35)
AST: 15 U/L (ref 0–37)
Albumin: 4 g/dL (ref 3.5–5.2)
Alkaline Phosphatase: 58 U/L (ref 39–117)
BUN: 16 mg/dL (ref 6–23)
CO2: 25 mEq/L (ref 19–32)
Calcium: 9.1 mg/dL (ref 8.4–10.5)
Chloride: 104 mEq/L (ref 96–112)
Creatinine, Ser: 1 mg/dL (ref 0.4–1.2)
GFR: 63.78 mL/min (ref >60.00)
Glucose, Bld: 90 mg/dL (ref 70–99)
Potassium: 4.1 mEq/L (ref 3.5–5.1)
Sodium: 137 mEq/L (ref 135–145)
Total Bilirubin: 0.6 mg/dL (ref 0.3–1.2)
Total Protein: 6.9 g/dL (ref 6.0–8.3)

## 2012-07-16 LAB — CBC WITH DIFFERENTIAL/PLATELET
Basophils Absolute: 0 10*3/uL (ref 0.0–0.1)
Basophils Relative: 0.6 % (ref 0.0–3.0)
Eosinophils Absolute: 0 10*3/uL (ref 0.0–0.7)
Eosinophils Relative: 0.5 % (ref 0.0–5.0)
HCT: 33.1 % — ABNORMAL LOW (ref 36.0–46.0)
Hemoglobin: 11.6 g/dL — ABNORMAL LOW (ref 12.0–15.0)
Lymphocytes Relative: 31.5 % (ref 12.0–46.0)
Lymphs Abs: 1.1 10*3/uL (ref 0.7–4.0)
MCHC: 35.1 g/dL (ref 30.0–36.0)
MCV: 89.7 fl (ref 78.0–100.0)
Monocytes Absolute: 0.5 10*3/uL (ref 0.1–1.0)
Monocytes Relative: 13 % — ABNORMAL HIGH (ref 3.0–12.0)
Neutro Abs: 1.9 10*3/uL (ref 1.4–7.7)
Neutrophils Relative %: 54.4 % (ref 43.0–77.0)
Platelets: 250 10*3/uL (ref 150.0–400.0)
RBC: 3.69 Mil/uL — ABNORMAL LOW (ref 3.87–5.11)
RDW: 12.3 % (ref 11.5–14.6)
WBC: 3.5 10*3/uL — ABNORMAL LOW (ref 4.5–10.5)

## 2012-07-16 LAB — TSH: TSH: 2.12 u[IU]/mL (ref 0.35–5.50)

## 2012-07-16 MED ORDER — ESCITALOPRAM 5 MG HALF TABLET: 5.0000 mg | ORAL_TABLET | Freq: Every day | ORAL | Status: DC

## 2012-07-16 NOTE — Patient Instructions (Addendum)
We are going to add 5 mg lexapro for treatment of both anxiety and hot flashes  You can continue to use the alprazolam as needed.  You do have post nasal drip that is probably the cause of the metallic taste in your mouth  I recommend adding cetirizine In the evening and resuming the flonase.    If the itching does not improve in 3 or 4 days with daily zyrtec let me know  The lump on your right chest wall feels like enlarged lymph nodes from your recent infection .  We will recheck in one week

## 2012-07-16 NOTE — Assessment & Plan Note (Addendum)
Cardiac workup has been normal.  May be secondary to hot flashes/menopausal syndrome vs anxiety . Prn alprazolam and trial of low dose lexapro.

## 2012-07-16 NOTE — Progress Notes (Signed)
Patient ID: Adrienne Robinson, female   DOB: 12-27-1955, 57 y.o.   MRN: [id number]   Patient Active Problem List   Diagnosis Date Noted  . Pruritus 07/16/2012  . Lymphadenopathy, thoracic 07/16/2012  . Acute sinusitis, unspecified 04/16/2012  . Leukopenia 03/05/2012  . AVM (arteriovenous malformation) brain 01/31/2012  . Need for influenza vaccination 01/28/2012  . Palpitations 01/22/2012  . Fatigue 01/22/2012  . Panic attack as reaction to stress 11/06/2011  . Ulcerative colitis with rectal bleeding 10/05/2011  . Family history of colon cancer 10/04/2011  . Hypertension   . Degenerative joint disease involving multiple joints   . Spinal stenosis of lumbar region     Subjective:  CC:   Chief Complaint  Patient presents with  . Acute Visit    shaking , heart racing, anyerisum prcocedure performed, rash    HPI:   Adrienne Johnsonis a 57 y.o. female who presents Urgent work in for Multiple new symptoms occurring since Sunday. Sunday woke up with sympotms consistent with sinusitis which has improved with use of levaquin.  However starting on Tuesday she has noted a metallic taste in mouth.    She is also having recurrent episodes of hot flushes and heart racing, accompanied by lips feel numb.  She also reports persistent feelign of itching under the skin involving the chest wall and forearams.  Spent all weekend outside working on c;leaning the exterior of her house and did not wear a msl despite painting and sweeping up a lot of pollen.   Also noting a small mass in upper chest on right side just below the clavicle.  Has noted increased drainage and ear fullness on the right side during recent sinusitis episode.  Up to date on mammograms.    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    Dr Bluford Kaufmann treated her for candida esophagitis with fluconazole x 5 days after endosocopy (done because of atypical chest pain) showed mildly severe esophagitis,  H  Pylori negative and candida found .  Did not tolerate Dexilant and sucralfate.  Her throat pain has resolved   Past Medical History  Diagnosis Date  . Hypertension   . Depression   . Degenerative joint disease involving multiple joints   . Spinal stenosis of lumbar region   . Anxiety   . Dizziness   . Headache(784.0)     having since  past month and a half  . Pneumonia     approx 6 years ago  . Ulcerative proctitis   . 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 Abdominal hysterectomy    . Oophorectomy    . Shoulder arthroscopy with rotator cuff repair and subacromial decompression  2007    left  . Nasal sinus surgery    . Appendectomy    . Cardiac catheterization      normal coronaries, no wall motion abnormalities 11/02/09 [facility])  . Radiology with anesthesia  02/26/2012    Procedure: RADIOLOGY WITH ANESTHESIA;  Surgeon: Oneal Grout, MD;  Location: [facility] OR;  Service: Radiology;  Laterality: N/A;  . Av fistula repair    . Tonsillectomy  adnoids       The following portions of the patient's history were reviewed and updated as appropriate: Allergies, current medications, and problem list.    Review of Systems:   12 Pt  review  of systems was negative except those addressed in the HPI,     History   Social History  . Marital Status: Married    Spouse Name: N/A    Number of Children: N/A  . Years of Education: N/A   Occupational History  . Not on file.   Social History Main Topics  . Smoking status: Never Smoker   . Smokeless tobacco: Never Used  . Alcohol Use: 1.2 oz/week    2 Glasses of wine per week     Comment: 2-3 week with dinner  . Drug Use: No  . Sexually Active: Not on file   Other Topics Concern  . Not on file   Social History Narrative  . No narrative on file    Objective:  BP 142/84  Pulse 112  Temp(Src) 98.6 F (37 C)  (Oral)  Resp 16  Wt 125 lb 4 oz (56.813 kg)  BMI 20.23 kg/m2  SpO2 99%  General appearance: alert, cooperative and appears stated age Ears: normal TM's and external ear canals both ears Throat: lips, mucosa, and tongue normal; teeth and gums normal Neck: no adenopathy, no carotid bruit, supple, symmetrical, trachea midline and thyroid not enlarged, symmetric, no tenderness/mass/nodules Back: symmetric, no curvature. ROM normal. No CVA tenderness. Lungs: clear to auscultation bilaterally Heart: regular rate and rhythm, S1, S2 normal, no murmur, click, rub or gallop Abdomen: soft, non-tender; bowel sounds normal; no masses,  no organomegaly Pulses: 2+ and symmetric Skin: Skin color, texture, turgor normal. No rashes or lesions Lymph nodes: Cervical, supraclavicular, and axillary nodes normal.  Assessment and Plan:  Palpitations Cardiac workup has been normal.  May be secondary to hot flashes/menopausal syndrome vs anxiety . Prn alprazolam and trial of low dose lexapro.   Leukopenia Prior evaluation by tim Finnegan nonconcerning  for BMD. ,  Normal variant  Pruritus Suspecting contact dermatitis vs allergic reaction to pollinated environoment, but checking LFTS as well as CBC   Acute sinusitis, unspecified Symptoms resolved except for mild lymphadenopathy.  Continue saline flushes and use of antihistamines.   Lymphadenopathy, thoracic Upper right chest wall, on same side as sinus symptoms..  Will recheck in one week and if not improved will iamge.   AVM (arteriovenous malformation) brain S/p embolization by Adrienne Robinson jan 6th . Repeat MRI/MRA shows reduction in size of avms noted and addressed previously.    Updated Medication List Outpatient Encounter Prescriptions as of 07/16/2012  Medication Sig Dispense Refill  . amLODipine (NORVASC) 5 MG tablet Take 1 tablet (5 mg total) by mouth daily.  90 tablet  3  . buPROPion (WELLBUTRIN XL) 150 MG 24 hr tablet Take 150 mg by mouth  every morning. Takes with a Wellbutrin XL 300 mg to total 450 mg daily      . buPROPion (WELLBUTRIN XL) 300 MG 24 hr tablet Take 300 mg by mouth every morning. Takes with a Wellbutrin XL 150 to total 450 mg daily      . fluticasone (FLONASE) 50 MCG/ACT nasal spray Place 2 sprays into the nose daily as needed for rhinitis or allergies.      Marland Kitchen HYDROcodone-acetaminophen (NORCO) 10-325 MG per tablet Take 1 tablet by mouth every 4 (four) hours as needed for pain.      . pantoprazole (PROTONIX) 40 MG tablet Take 40 mg by mouth 2 (two) times daily.      Marland Kitchen zolpidem (AMBIEN) 10 MG tablet Take 10 mg by mouth at bedtime as needed for sleep.       Marland Kitchen  escitalopram (LEXAPRO) 5 mg TABS Take 0.5 tablets (5 mg total) by mouth daily.  30 tablet  6  . levofloxacin (LEVAQUIN) 500 MG tablet Take 1 tablet (500 mg total) by mouth daily.  7 tablet  0  . omeprazole (PRILOSEC) 20 MG capsule Take 1 capsule (20 mg total) by mouth daily.  60 capsule  0  . sucralfate (CARAFATE) 1 G tablet Take 1 g by mouth 4 (four) times daily.       No facility-administered encounter medications on file as of 07/16/2012.

## 2012-07-16 NOTE — Assessment & Plan Note (Signed)
Prior evaluation by tim Finnegan nonconcerning  for BMD. ,  Normal variant

## 2012-07-16 NOTE — Assessment & Plan Note (Addendum)
S/p embolization by [name] jan 6th . Repeat MRI/MRA shows reduction in size of avms noted and addressed previously.

## 2012-07-16 NOTE — Assessment & Plan Note (Signed)
Symptoms resolved except for mild lymphadenopathy.  Continue saline flushes and use of antihistamines.

## 2012-07-16 NOTE — Assessment & Plan Note (Signed)
Upper right chest wall, on same side as sinus symptoms..  Will recheck in one week and if not improved will iamge.

## 2012-07-16 NOTE — Assessment & Plan Note (Signed)
Suspecting contact dermatitis vs allergic reaction to pollinated environoment, but checking LFTS as well as CBC

## 2012-07-17 ENCOUNTER — Encounter: Payer: Self-pay | Admitting: Internal Medicine

## 2012-07-19 ENCOUNTER — Telehealth: Payer: Self-pay | Admitting: *Deleted

## 2012-07-19 ENCOUNTER — Telehealth: Payer: Self-pay

## 2012-07-19 NOTE — Telephone Encounter (Signed)
MyChart Message: All of your labs are relatively normal or stable. Nothing To sugest a cuase for the itching and flushing. White count is stable. choelsterol is fine  Notified patient about her recent labs ^^^^^^^^^^^.

## 2012-07-19 NOTE — Telephone Encounter (Signed)
Yes, she can try fexofenadine (allegra) 180 mg dose daily

## 2012-07-19 NOTE — Telephone Encounter (Signed)
The OTC Zyrtec you recommended patient reports make her feel weird is there any thing else you would recommend.

## 2012-07-22 NOTE — Telephone Encounter (Signed)
Patient notified  To try allegra.

## 2012-07-26 ENCOUNTER — Ambulatory Visit (INDEPENDENT_AMBULATORY_CARE_PROVIDER_SITE_OTHER): Payer: Managed Care, Other (non HMO) | Admitting: Internal Medicine

## 2012-07-26 VITALS — BP 158/88 | HR 73 | Temp 98.3°F | Resp 16 | Wt 127.0 lb

## 2012-07-26 DIAGNOSIS — R590 Localized enlarged lymph nodes: Secondary | ICD-10-CM

## 2012-07-26 DIAGNOSIS — R222 Localized swelling, mass and lump, trunk: Secondary | ICD-10-CM

## 2012-07-26 DIAGNOSIS — R599 Enlarged lymph nodes, unspecified: Secondary | ICD-10-CM

## 2012-07-26 DIAGNOSIS — F430 Acute stress reaction: Secondary | ICD-10-CM

## 2012-07-26 DIAGNOSIS — F410 Panic disorder [episodic paroxysmal anxiety] without agoraphobia: Secondary | ICD-10-CM

## 2012-07-26 DIAGNOSIS — R4589 Other symptoms and signs involving emotional state: Secondary | ICD-10-CM

## 2012-07-26 DIAGNOSIS — I10 Essential (primary) hypertension: Secondary | ICD-10-CM

## 2012-07-26 MED ORDER — METRONIDAZOLE 1 % EX GEL: Freq: Every day | CUTANEOUS | Status: DC

## 2012-07-26 MED ORDER — ALPRAZOLAM 0.5 MG PO TABS: 0.5000 mg | ORAL_TABLET | Freq: Every evening | ORAL | Status: DC | PRN

## 2012-07-26 NOTE — Progress Notes (Signed)
Patient ID: Adrienne Robinson, female   DOB: 09-10-1955, 57 y.o.   MRN: [id number]   Patient Active Problem List   Diagnosis Date Noted  . Pruritus 07/16/2012  . Lymphadenopathy, thoracic 07/16/2012  . Leukopenia 03/05/2012  . AVM (arteriovenous malformation) brain 01/31/2012  . Need for influenza vaccination 01/28/2012  . Palpitations 01/22/2012  . Fatigue 01/22/2012  . Panic attack as reaction to stress 11/06/2011  . Ulcerative colitis with rectal bleeding 10/05/2011  . Family history of colon cancer 10/04/2011  . Hypertension   . Degenerative joint disease involving multiple joints   . Spinal stenosis of lumbar region     Subjective:  CC:   Chief Complaint  Patient presents with  . Follow-up    HPI:   Adrienne Robinson a 57 y.o. female who presents for follow up on multiple complaints voice two weeks ago.   She is tolerating initiation of lexapro for increased anxiety and hot flushing.  He feels better on lexapro but is having more insmonia with nighttime dosing.  She has been using alprazolam sparingly and is requesting refill.   Her blood pressure has been persistently elevated on the 5 mg amlodipine dose.   She contineus to have nontender swelling on her right chest wall under her clavicle    Past Medical History  Diagnosis Date  . Hypertension   . Depression   . Degenerative joint disease involving multiple joints   . Spinal stenosis of lumbar region   . Anxiety   . Dizziness   . Headache(784.0)     having since  past month and a half  . Pneumonia     approx 6 years ago  . Ulcerative proctitis   . 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 Abdominal hysterectomy    . Oophorectomy    . Shoulder arthroscopy with rotator cuff repair and subacromial decompression  2007    left  . Nasal sinus surgery    . Appendectomy    . Cardiac catheterization     normal coronaries, no wall motion abnormalities 11/02/09 [facility])  . Radiology with anesthesia  02/26/2012    Procedure: RADIOLOGY WITH ANESTHESIA;  Surgeon: Oneal Grout, MD;  Location: [facility] OR;  Service: Radiology;  Laterality: N/A;  . Av fistula repair    . Tonsillectomy  adnoids       The following portions of the patient's history were reviewed and updated as appropriate: Allergies, current medications, and problem list.    Review of Systems:   12 Pt  review of systems was negative except those addressed in the HPI,     History   Social History  . Marital Status: Married    Spouse Name: N/A    Number of Children: N/A  . Years of Education: N/A   Occupational History  . Not on file.   Social History Main Topics  . Smoking status: Never Smoker   . Smokeless tobacco: Never Used  . Alcohol Use: 1.2 oz/week    2 Glasses of wine per week     Comment: 2-3 week with dinner  . Drug Use: No  . Sexually Active: Not on file   Other Topics Concern  . Not on file   Social History Narrative  . No narrative on file    Objective:  BP 158/88  Pulse 73  Temp(Src) 98.3 F (36.8 C) (Oral)  Resp 16  Wt 127 lb (57.607 kg)  BMI 20.51 kg/m2  SpO2 98%  General appearance: alert, cooperative and appears stated age Ears: normal TM's and external ear canals both ears Throat: lips, mucosa, and tongue normal; teeth and gums normal Neck: no adenopathy, no carotid bruit, supple, symmetrical, trachea midline and thyroid not enlarged, symmetric, no tenderness/mass/nodules Back: symmetric, no curvature. ROM normal. No CVA tenderness. Chest: mobile 2 cm mass on right chest wall.  Lungs: clear to auscultation bilaterally Heart: regular rate and rhythm, S1, S2 normal, no murmur, click, rub or gallop Abdomen: soft, non-tender; bowel sounds normal; no masses,  no organomegaly Pulses: 2+ and symmetric Skin: Skin color, texture, turgor normal. No rashes or lesions Lymph nodes:  Cervical, supraclavicular, and axillary nodes normal.  Assessment and Plan:  Hypertension Advised to increase the amlodipine to 10 mg daily   Lymphadenopathy, thoracic She has persistent swelling under her right clavicle on her chest wall.  Diagnostic ultrasound ordered.  Panic attack as reaction to stress Advised to continue lexapro but change administration to morning. Prn alprazolam     Updated Medication List Outpatient Encounter Prescriptions as of 07/26/2012  Medication Sig Dispense Refill  . amLODipine (NORVASC) 5 MG tablet Take 1 tablet (5 mg total) by mouth daily.  90 tablet  3  . buPROPion (WELLBUTRIN XL) 150 MG 24 hr tablet Take 150 mg by mouth every morning. Takes with a Wellbutrin XL 300 mg to total 450 mg daily      . buPROPion (WELLBUTRIN XL) 300 MG 24 hr tablet Take 300 mg by mouth every morning. Takes with a Wellbutrin XL 150 to total 450 mg daily      . escitalopram (LEXAPRO) 5 mg TABS Take 0.5 tablets (5 mg total) by mouth daily.  30 tablet  6  . fluticasone (FLONASE) 50 MCG/ACT nasal spray Place 2 sprays into the nose daily as needed for rhinitis or allergies.      Marland Kitchen HYDROcodone-acetaminophen (NORCO) 10-325 MG per tablet Take 1 tablet by mouth every 4 (four) hours as needed for pain.      . pantoprazole (PROTONIX) 40 MG tablet Take 40 mg by mouth 2 (two) times daily.      Marland Kitchen zolpidem (AMBIEN) 10 MG tablet Take 10 mg by mouth at bedtime as needed for sleep.       Marland Kitchen ALPRAZolam (XANAX) 0.5 MG tablet Take 1 tablet (0.5 mg total) by mouth at bedtime as needed for sleep.  30 tablet  3  . metroNIDAZOLE (METROGEL) 1 % gel Apply topically daily.  45 g  3  . [DISCONTINUED] levofloxacin (LEVAQUIN) 500 MG tablet Take 1 tablet (500 mg total) by mouth daily.  7 tablet  0  . [DISCONTINUED] omeprazole (PRILOSEC) 20 MG capsule Take 1 capsule (20 mg total) by mouth daily.  60 capsule  0  . [DISCONTINUED] sucralfate (CARAFATE) 1 G tablet Take 1 g by mouth 4 (four) times daily.       No  facility-administered encounter medications on file as of 07/26/2012.     Orders Placed This Encounter  Procedures  . US Breast Right    No Follow-up on file.

## 2012-07-28 ENCOUNTER — Encounter: Payer: Self-pay | Admitting: Internal Medicine

## 2012-07-28 NOTE — Assessment & Plan Note (Signed)
She has persistent swelling under her right clavicle on her chest wall.  Diagnostic ultrasound ordered.

## 2012-07-28 NOTE — Assessment & Plan Note (Signed)
Advised to increase the amlodipine to 10 mg daily

## 2012-07-28 NOTE — Assessment & Plan Note (Addendum)
Advised to continue lexapro but change administration to morning. Prn alprazolam

## 2012-07-29 ENCOUNTER — Telehealth: Payer: Self-pay | Admitting: Internal Medicine

## 2012-07-29 NOTE — Telephone Encounter (Signed)
Patient notified

## 2012-07-29 NOTE — Telephone Encounter (Signed)
Ultrasound of chest wall was normal.  No masses, lymph nodes or fluid collections.

## 2012-07-31 ENCOUNTER — Encounter: Payer: Self-pay | Admitting: Internal Medicine

## 2012-08-01 ENCOUNTER — Other Ambulatory Visit: Payer: Self-pay | Admitting: Internal Medicine

## 2012-08-01 MED ORDER — DIAZEPAM 10 MG PO TABS: 10.0000 mg | ORAL_TABLET | Freq: Two times a day (BID) | ORAL | Status: DC | PRN

## 2012-08-01 MED ORDER — AMLODIPINE BESYLATE 10 MG PO TABS: 10.0000 mg | ORAL_TABLET | Freq: Every day | ORAL | Status: DC

## 2012-08-14 ENCOUNTER — Encounter: Payer: Self-pay | Admitting: Internal Medicine

## 2012-09-01 ENCOUNTER — Other Ambulatory Visit: Payer: Self-pay | Admitting: Internal Medicine

## 2012-09-02 ENCOUNTER — Encounter: Payer: Self-pay | Admitting: Internal Medicine

## 2012-09-02 NOTE — Telephone Encounter (Signed)
Last visit 6/14. Refill? 

## 2012-09-03 MED ORDER — DOXYCYCLINE HYCLATE 100 MG PO TABS: 100.0000 mg | ORAL_TABLET | Freq: Two times a day (BID) | ORAL | Status: DC

## 2012-09-03 NOTE — Telephone Encounter (Signed)
Rx faxed to pharmacy  

## 2012-09-06 ENCOUNTER — Encounter: Payer: Self-pay | Admitting: Internal Medicine

## 2012-09-19 ENCOUNTER — Encounter: Payer: Self-pay | Admitting: Internal Medicine

## 2012-09-30 ENCOUNTER — Telehealth: Payer: Self-pay | Admitting: *Deleted

## 2012-09-30 MED ORDER — METHOCARBAMOL 750 MG PO TABS: 750.0000 mg | ORAL_TABLET | Freq: Three times a day (TID) | ORAL | Status: DC

## 2012-09-30 NOTE — Telephone Encounter (Addendum)
Refill Request  Methocarbamol 750 mg tablet  #30  Take one tablet every evening

## 2012-09-30 NOTE — Telephone Encounter (Signed)
Last fill 10/29/11 advise as to refill? Last OV 07/26/12.

## 2012-10-22 ENCOUNTER — Other Ambulatory Visit: Payer: Self-pay | Admitting: Internal Medicine

## 2012-10-23 NOTE — Telephone Encounter (Signed)
Last visit 07/26/12.

## 2012-10-26 ENCOUNTER — Other Ambulatory Visit: Payer: Self-pay | Admitting: Internal Medicine

## 2012-10-28 ENCOUNTER — Encounter: Payer: Self-pay | Admitting: *Deleted

## 2012-10-28 NOTE — Telephone Encounter (Signed)
Okay to refill? 

## 2012-10-29 ENCOUNTER — Ambulatory Visit (INDEPENDENT_AMBULATORY_CARE_PROVIDER_SITE_OTHER): Payer: Managed Care, Other (non HMO) | Admitting: Internal Medicine

## 2012-10-29 ENCOUNTER — Encounter: Payer: Self-pay | Admitting: Internal Medicine

## 2012-10-29 VITALS — BP 148/80 | HR 75 | Temp 98.2°F | Resp 12 | Ht 66.0 in | Wt 123.0 lb

## 2012-10-29 DIAGNOSIS — M6289 Other specified disorders of muscle: Secondary | ICD-10-CM

## 2012-10-29 DIAGNOSIS — R590 Localized enlarged lymph nodes: Secondary | ICD-10-CM

## 2012-10-29 DIAGNOSIS — M2550 Pain in unspecified joint: Secondary | ICD-10-CM

## 2012-10-29 DIAGNOSIS — K2100 Gastro-esophageal reflux disease with esophagitis, without bleeding: Secondary | ICD-10-CM | POA: Insufficient documentation

## 2012-10-29 DIAGNOSIS — R29898 Other symptoms and signs involving the musculoskeletal system: Secondary | ICD-10-CM

## 2012-10-29 DIAGNOSIS — D72819 Decreased white blood cell count, unspecified: Secondary | ICD-10-CM

## 2012-10-29 DIAGNOSIS — R5383 Other fatigue: Secondary | ICD-10-CM

## 2012-10-29 DIAGNOSIS — K209 Esophagitis, unspecified without bleeding: Secondary | ICD-10-CM

## 2012-10-29 DIAGNOSIS — R5381 Other malaise: Secondary | ICD-10-CM

## 2012-10-29 DIAGNOSIS — I10 Essential (primary) hypertension: Secondary | ICD-10-CM

## 2012-10-29 DIAGNOSIS — R599 Enlarged lymph nodes, unspecified: Secondary | ICD-10-CM

## 2012-10-29 DIAGNOSIS — R61 Generalized hyperhidrosis: Secondary | ICD-10-CM

## 2012-10-29 DIAGNOSIS — N289 Disorder of kidney and ureter, unspecified: Secondary | ICD-10-CM

## 2012-10-29 DIAGNOSIS — R1012 Left upper quadrant pain: Secondary | ICD-10-CM

## 2012-10-29 LAB — CBC WITH DIFFERENTIAL/PLATELET
Basophils Absolute: 0 10*3/uL (ref 0.0–0.1)
Basophils Relative: 0.7 % (ref 0.0–3.0)
Eosinophils Absolute: 0 10*3/uL (ref 0.0–0.7)
Eosinophils Relative: 1.2 % (ref 0.0–5.0)
HCT: 34 % — ABNORMAL LOW (ref 36.0–46.0)
Hemoglobin: 11.8 g/dL — ABNORMAL LOW (ref 12.0–15.0)
Lymphocytes Relative: 30.5 % (ref 12.0–46.0)
Lymphs Abs: 1 10*3/uL (ref 0.7–4.0)
MCHC: 34.6 g/dL (ref 30.0–36.0)
MCV: 89.7 fl (ref 78.0–100.0)
Monocytes Absolute: 0.4 10*3/uL (ref 0.1–1.0)
Monocytes Relative: 12.9 % — ABNORMAL HIGH (ref 3.0–12.0)
Neutro Abs: 1.8 10*3/uL (ref 1.4–7.7)
Neutrophils Relative %: 54.7 % (ref 43.0–77.0)
Platelets: 282 10*3/uL (ref 150.0–400.0)
RBC: 3.8 Mil/uL — ABNORMAL LOW (ref 3.87–5.11)
RDW: 12.6 % (ref 11.5–14.6)
WBC: 3.3 10*3/uL — ABNORMAL LOW (ref 4.5–10.5)

## 2012-10-29 LAB — COMPREHENSIVE METABOLIC PANEL
ALT: 17 U/L (ref 0–35)
AST: 17 U/L (ref 0–37)
Albumin: 4.4 g/dL (ref 3.5–5.2)
Alkaline Phosphatase: 61 U/L (ref 39–117)
BUN: 18 mg/dL (ref 6–23)
CO2: 27 mEq/L (ref 19–32)
Calcium: 9.5 mg/dL (ref 8.4–10.5)
Chloride: 104 mEq/L (ref 96–112)
Creatinine, Ser: 1.1 mg/dL (ref 0.4–1.2)
GFR: 52.78 mL/min — ABNORMAL LOW (ref >60.00)
Glucose, Bld: 95 mg/dL (ref 70–99)
Potassium: 3.7 mEq/L (ref 3.5–5.1)
Sodium: 139 mEq/L (ref 135–145)
Total Bilirubin: 0.7 mg/dL (ref 0.3–1.2)
Total Protein: 6.9 g/dL (ref 6.0–8.3)

## 2012-10-29 LAB — SEDIMENTATION RATE: Sed Rate: 15 mm/hr (ref 0–22)

## 2012-10-29 LAB — CK: Total CK: 84 U/L (ref 7–177)

## 2012-10-29 LAB — C-REACTIVE PROTEIN: CRP: <0.5 mg/dL (ref 0.5–20.0)

## 2012-10-29 MED ORDER — BUPROPION HCL ER (XL) 300 MG PO TB24: 300.0000 mg | ORAL_TABLET | Freq: Every morning | ORAL | Status: DC

## 2012-10-29 MED ORDER — BUPROPION HCL ER (XL) 150 MG PO TB24: 150.0000 mg | ORAL_TABLET | Freq: Every morning | ORAL | Status: DC

## 2012-10-29 MED ORDER — ZOLPIDEM TARTRATE 10 MG PO TABS: ORAL_TABLET | ORAL | Status: DC

## 2012-10-29 MED ORDER — HYDROCODONE-ACETAMINOPHEN 10-325 MG PO TABS: 1.0000 | ORAL_TABLET | ORAL | Status: DC | PRN

## 2012-10-29 NOTE — Assessment & Plan Note (Addendum)
Chronic since September ,  No improvement with a week vacation at the beach.  Will rule out renal in hepatic insufficiency, anemia, rhabdomyolysis, inflammatory joint disease. She has been to cardiac catheterization which was normal. Thyroid function was

## 2012-10-29 NOTE — Assessment & Plan Note (Addendum)
elevated today but was normal during recent podiatry evaluation per patient her blood pressure was 123/75

## 2012-10-29 NOTE — Assessment & Plan Note (Signed)
Symptoms have resolved she is no longer taking a daily proton pump inhibitor. However she does have abdominal pain on exam today if her CT abdomen and pelvis is normal I would suggest that she go back on a daily proton pump inhibitor for management of dyspepsia

## 2012-10-29 NOTE — Patient Instructions (Signed)
I recommend resuming flonase and saline flushes  I will refill the wellbutrin, ambien and vicodin

## 2012-10-29 NOTE — Assessment & Plan Note (Signed)
Ultrasound of the right clavicle was normal.

## 2012-10-29 NOTE — Assessment & Plan Note (Signed)
She has mild abdominal pain and is tender in the left upper quadrant concerning for an possible splenomegaly on exam today. Given her low white count and recurrent hot flashes and would like to evaluate her spleen with a CT of the abdomen and pelvis to rule out lymphoma

## 2012-10-29 NOTE — Progress Notes (Signed)
Patient ID: Adrienne Robinson, female   DOB: Dec 21, 1955, 57 y.o.   MRN: [id number]   Patient Active Problem List   Diagnosis Date Noted  . Acute esophagitis 10/29/2012  . Abdominal pain, left upper quadrant 10/29/2012  . Pruritus 07/16/2012  . Lymphadenopathy, thoracic 07/16/2012  . Leukopenia 03/05/2012  . AVM (arteriovenous malformation) brain 01/31/2012  . Need for influenza vaccination 01/28/2012  . Palpitations 01/22/2012  . Fatigue 01/22/2012  . Panic attack as reaction to stress 11/06/2011  . Ulcerative colitis with rectal bleeding 10/05/2011  . Family history of colon cancer 10/04/2011  . Hypertension   . Degenerative joint disease involving multiple joints   . Spinal stenosis of lumbar region     Subjective:  CC:   Chief Complaint  Patient presents with  . Fatigue  . joint discomfort    HPI:   Adrienne Johnsonis a 57 y.o. female who presents Followup on chronic conditions including fatigue hot flashes and joint pain. She feels generally" yucky" all the time and really has not felt well since last September. She states that she goes to periods of joint aching particularly her left first metacarpal joint both shoulders back and knees. She notes that her legs feel very tired when she gets the top of the stairs in her home. She works from home and goes up and down her stairs "100s of times per day". She does not exercise regularly. She's been having sinus drainage. She has hot flashes from the top of her head to her neck but not below.  She states that she also gets a "flulike symptoms" that lasts offered for more than an hour or so. This includes joint aching feeling feverish and feeling nauseated . She denies any changes in bowel habits. She has no appetite but eats 3 meals a day.. She's not drinking alcohol on a daily basis because she is too much work to do in the evenings.. She's had no unintentional weight loss. She is averaging 60 hours of work during the week and works two out  of four weekends every month.    Past Medical History  Diagnosis Date  . Hypertension   . Depression   . Degenerative joint disease involving multiple joints   . Spinal stenosis of lumbar region   . Anxiety   . Dizziness   . Headache(784.0)     having since  past month and a half  . Pneumonia     approx 6 years ago  . Ulcerative proctitis   . 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 Abdominal hysterectomy    . Oophorectomy    . Shoulder arthroscopy with rotator cuff repair and subacromial decompression  2007    left  . Nasal sinus surgery    . Appendectomy    . Cardiac catheterization      normal coronaries, no wall motion abnormalities 11/02/09 [facility])  . Radiology with anesthesia  02/26/2012    Procedure: RADIOLOGY WITH ANESTHESIA;  Surgeon: Oneal Grout, MD;  Location: [facility] OR;  Service: Radiology;  Laterality: N/A;  . Av fistula repair    . Tonsillectomy  adnoids       The following portions of the patient's history were reviewed and updated as appropriate: Allergies, current medications, and problem list.    Review of Systems:   12 Pt  review of systems was negative except those addressed  in the HPI,     History   Social History  . Marital Status: Married    Spouse Name: N/A    Number of Children: N/A  . Years of Education: N/A   Occupational History  . Not on file.   Social History Main Topics  . Smoking status: Never Smoker   . Smokeless tobacco: Never Used  . Alcohol Use: 1.2 oz/week    2 Glasses of wine per week     Comment: 2-3 week with dinner  . Drug Use: No  . Sexual Activity: Not on file   Other Topics Concern  . Not on file   Social History Narrative  . No narrative on file    Objective:  Filed Vitals:   10/29/12 0758  BP: 148/80  Pulse: 75  Temp: 98.2 F (36.8 C)  Resp: 12     General appearance: alert,  cooperative and appears stated age Ears: normal TM's and external ear canals both ears Throat: lips, mucosa, and tongue normal; teeth and gums normal Neck: no adenopathy, no carotid bruit, supple, symmetrical, trachea midline and thyroid not enlarged, symmetric, no tenderness/mass/nodules Back: symmetric, no curvature. ROM normal. No CVA tenderness. Lungs: clear to auscultation bilaterally Heart: regular rate and rhythm, S1, S2 normal, no murmur, click, rub or gallop Abdomen: soft, tender in LIQ and periumbilical area, ; bowel sounds normal; no masses,   Pulses: 2+ and symmetric Skin: Skin color, texture, turgor normal. No rashes or lesions Lymph nodes: Cervical, supraclavicular, and axillary nodes normal.  Assessment and Plan:  Fatigue Chronic since September ,  No improvement with a week vacation at the beach.  Given her concurrent hot flashes, muscle aches and polyarthralgias, Will rule out renal in hepatic insufficiency, anemia, rhabdomyolysis, inflammatory joint disease. She has been to cardiac catheterization which was normal. Thyroid function was  Hypertension elevated today but was normal during recent podiatry evaluation per patient her blood pressure was 123/75  Acute esophagitis Symptoms have resolved she is no longer taking a daily proton pump inhibitor. However she does have abdominal pain on exam today if her CT abdomen and pelvis is normal I would suggest that she go back on a daily proton pump inhibitor for management of dyspepsia  Lymphadenopathy, thoracic Ultrasound of the right clavicle was normal.   Abdominal pain, left upper quadrant She has mild abdominal pain and is tender in the left upper quadrant concerning for an possible splenomegaly on exam today. Given her low white count and recurrent hot flashes and would like to evaluate her spleen with a CT of the abdomen and pelvis to rule out lymphoma   A total of 40 minutes was spent with patient more than half of  which was spent in counseling, reviewing records from other prviders and coordination of care.   Updated Medication List Outpatient Encounter Prescriptions as of 10/29/2012  Medication Sig Dispense Refill  . amLODipine (NORVASC) 10 MG tablet Take 1 tablet (10 mg total) by mouth daily.  90 tablet  3  . buPROPion (WELLBUTRIN XL) 150 MG 24 hr tablet Take 1 tablet (150 mg total) by mouth every morning. Takes with a Wellbutrin XL 300 mg to total 450 mg daily  90 tablet  2  . buPROPion (WELLBUTRIN XL) 300 MG 24 hr tablet Take 1 tablet (300 mg total) by mouth every morning. Takes with a Wellbutrin XL 150 to total 450 mg daily  90 tablet  2  . gabapentin (NEURONTIN) 100 MG capsule TAKE 1 CAPSULE  TWICE DAILY AND 3 CAPSULES AT NIGHT  150 capsule  0  . HYDROcodone-acetaminophen (NORCO) 10-325 MG per tablet Take 1 tablet by mouth every 4 (four) hours as needed for pain.  60 tablet  5  . metroNIDAZOLE (METROGEL) 1 % gel Apply topically daily.  45 g  3  . zolpidem (AMBIEN) 10 MG tablet TAKE 1 TABLET AT BEDTIME AS NEEDED  30 tablet  5  . [DISCONTINUED] buPROPion (WELLBUTRIN XL) 150 MG 24 hr tablet Take 150 mg by mouth every morning. Takes with a Wellbutrin XL 300 mg to total 450 mg daily      . [DISCONTINUED] buPROPion (WELLBUTRIN XL) 300 MG 24 hr tablet Take 300 mg by mouth every morning. Takes with a Wellbutrin XL 150 to total 450 mg daily      . [DISCONTINUED] HYDROcodone-acetaminophen (NORCO) 10-325 MG per tablet Take 1 tablet by mouth every 4 (four) hours as needed for pain.      . [DISCONTINUED] zolpidem (AMBIEN) 10 MG tablet TAKE 1 TABLET AT BEDTIME AS NEEDED  30 tablet  5  . [DISCONTINUED] buPROPion (WELLBUTRIN XL) 300 MG 24 hr tablet TAKE 1 TABLET (300 MG TOTAL) BY MOUTH DAILY.  30 tablet  5  . [DISCONTINUED] diazepam (VALIUM) 10 MG tablet Take 1 tablet (10 mg total) by mouth every 12 (twelve) hours as needed for anxiety.  30 tablet  0  . [DISCONTINUED] doxycycline (VIBRA-TABS) 100 MG tablet Take 1 tablet  (100 mg total) by mouth 2 (two) times daily.  14 tablet  1  . [DISCONTINUED] escitalopram (LEXAPRO) 5 mg TABS Take 0.5 tablets (5 mg total) by mouth daily.  30 tablet  6  . [DISCONTINUED] fluticasone (FLONASE) 50 MCG/ACT nasal spray Place 2 sprays into the nose daily as needed for rhinitis or allergies.      . [DISCONTINUED] methocarbamol (ROBAXIN) 750 MG tablet Take 1 tablet (750 mg total) by mouth 3 (three) times daily. As needed for muscle spasm  60 tablet  4  . [DISCONTINUED] pantoprazole (PROTONIX) 40 MG tablet Take 40 mg by mouth 2 (two) times daily.       No facility-administered encounter medications on file as of 10/29/2012.     Orders Placed This Encounter  Procedures  . HM MAMMOGRAPHY  . CT Abdomen W Contrast  . CBC with Differential  . CK  . Comprehensive metabolic panel  . Sedimentation rate  . C-reactive protein  . ANA w/Reflex if Positive  . Cyclic citrul peptide antibody, IgG  . Rheumatoid factor  . HM COLONOSCOPY    No Follow-up on file.

## 2012-10-30 ENCOUNTER — Encounter: Payer: Self-pay | Admitting: Internal Medicine

## 2012-10-30 LAB — ANA W/REFLEX IF POSITIVE: Anti Nuclear Antibody(ANA): NEGATIVE

## 2012-10-30 LAB — RHEUMATOID FACTOR: Rhuematoid fact SerPl-aCnc: <10 IU/mL (ref <=14)

## 2012-10-30 LAB — CYCLIC CITRUL PEPTIDE ANTIBODY, IGG: Cyclic Citrullin Peptide Ab: <2 U/mL (ref 0.0–5.0)

## 2012-10-31 ENCOUNTER — Encounter: Payer: Self-pay | Admitting: Internal Medicine

## 2012-10-31 ENCOUNTER — Encounter: Payer: Self-pay | Admitting: Emergency Medicine

## 2012-10-31 NOTE — Addendum Note (Signed)
Addended by: Sherlene Shams on: 10/31/2012 07:06 AM   Modules accepted: Orders

## 2012-11-06 ENCOUNTER — Telehealth: Payer: Self-pay | Admitting: Internal Medicine

## 2012-11-06 NOTE — Telephone Encounter (Signed)
I saw you had emailed patient?

## 2012-11-06 NOTE — Telephone Encounter (Signed)
LVM for patient to call our office  

## 2012-11-06 NOTE — Telephone Encounter (Signed)
Asking that you give her a call.  Says you called her a few days ago regarding a CT scan that is scheduled for tomorrow 9/18

## 2012-11-07 ENCOUNTER — Ambulatory Visit: Payer: Self-pay | Admitting: Internal Medicine

## 2012-11-07 ENCOUNTER — Telehealth: Payer: Self-pay | Admitting: Internal Medicine

## 2012-11-07 DIAGNOSIS — R1012 Left upper quadrant pain: Secondary | ICD-10-CM

## 2012-11-08 ENCOUNTER — Encounter: Payer: Self-pay | Admitting: Internal Medicine

## 2012-11-08 MED ORDER — HYDROCODONE-ACETAMINOPHEN 10-325 MG PO TABS: 1.0000 | ORAL_TABLET | ORAL | Status: DC | PRN

## 2012-11-08 MED ORDER — LACTULOSE 20 GM/30ML PO SOLN: 30.0000 mL | ORAL | Status: DC | PRN

## 2012-11-08 NOTE — Telephone Encounter (Signed)
Rx faxed

## 2012-11-08 NOTE — Telephone Encounter (Signed)
Adrienne Robinson,  [facility] refill was fro the wronf amoutn it should be #[phone].  New rx printed.

## 2012-11-11 ENCOUNTER — Encounter: Payer: Self-pay | Admitting: Internal Medicine

## 2012-11-12 ENCOUNTER — Encounter: Payer: Self-pay | Admitting: Internal Medicine

## 2012-11-21 ENCOUNTER — Encounter: Payer: Self-pay | Admitting: Internal Medicine

## 2012-11-29 ENCOUNTER — Ambulatory Visit: Payer: Managed Care, Other (non HMO)

## 2012-11-30 ENCOUNTER — Encounter: Payer: Self-pay | Admitting: Internal Medicine

## 2012-12-03 MED ORDER — LINACLOTIDE 290 MCG PO CAPS: 290.0000 ug | ORAL_CAPSULE | Freq: Every day | ORAL | Status: DC

## 2012-12-07 ENCOUNTER — Other Ambulatory Visit: Payer: Self-pay | Admitting: Internal Medicine

## 2012-12-09 ENCOUNTER — Other Ambulatory Visit: Payer: Self-pay | Admitting: Internal Medicine

## 2012-12-09 NOTE — Telephone Encounter (Signed)
The patient is completely out of her buPROPion (WELLBUTRIN XL) 300 MG 24 hr tablet and buPROPion (WELLBUTRIN XL) 150 MG 24 hr tablet.  [facility] cancelled her previous prescription and they are needing another prescription.

## 2012-12-09 NOTE — Telephone Encounter (Signed)
rxs for wellbutrin 300 and 150 mg doses printed out

## 2012-12-09 NOTE — Telephone Encounter (Signed)
Rx's were faxed 

## 2012-12-09 NOTE — Telephone Encounter (Signed)
Please advise okay to fill last OV 10/29/12

## 2012-12-16 ENCOUNTER — Ambulatory Visit: Payer: Self-pay | Admitting: Podiatry

## 2012-12-19 ENCOUNTER — Telehealth: Payer: Self-pay | Admitting: Internal Medicine

## 2012-12-19 MED ORDER — HYDROCODONE-ACETAMINOPHEN 10-325 MG PO TABS: 1.0000 | ORAL_TABLET | ORAL | Status: DC | PRN

## 2012-12-19 NOTE — Telephone Encounter (Signed)
Patient to pick up today.

## 2012-12-19 NOTE — Telephone Encounter (Signed)
Pt needing rx for hydrocodone, say she has 5 refills, so it just needs to be picked up.  Asking for a call when this is ready.  Pt is coming 10/31 a.m. for flu shot and is asking if she could pick it up at that time.

## 2012-12-19 NOTE — Telephone Encounter (Signed)
Ok to refill,  printed rx  

## 2012-12-19 NOTE — Telephone Encounter (Signed)
Patient coming for flu- shot tomorrow and would like to pick up script for hydrocodone has signed agreement in September.

## 2012-12-20 ENCOUNTER — Ambulatory Visit (INDEPENDENT_AMBULATORY_CARE_PROVIDER_SITE_OTHER): Payer: Managed Care, Other (non HMO) | Admitting: *Deleted

## 2012-12-20 ENCOUNTER — Ambulatory Visit: Payer: Managed Care, Other (non HMO)

## 2012-12-20 DIAGNOSIS — Z23 Encounter for immunization: Secondary | ICD-10-CM

## 2012-12-23 ENCOUNTER — Encounter: Payer: Self-pay | Admitting: Internal Medicine

## 2012-12-23 ENCOUNTER — Ambulatory Visit: Payer: Self-pay | Admitting: Podiatry

## 2012-12-24 ENCOUNTER — Other Ambulatory Visit (INDEPENDENT_AMBULATORY_CARE_PROVIDER_SITE_OTHER): Payer: Managed Care, Other (non HMO)

## 2012-12-24 DIAGNOSIS — N289 Disorder of kidney and ureter, unspecified: Secondary | ICD-10-CM

## 2012-12-24 DIAGNOSIS — D709 Neutropenia, unspecified: Secondary | ICD-10-CM

## 2012-12-24 LAB — BASIC METABOLIC PANEL
BUN: 25 mg/dL — ABNORMAL HIGH (ref 6–23)
CO2: 26 mEq/L (ref 19–32)
Calcium: 9.5 mg/dL (ref 8.4–10.5)
Chloride: 104 mEq/L (ref 96–112)
Creatinine, Ser: 1 mg/dL (ref 0.4–1.2)
GFR: 62.18 mL/min (ref >60.00)
Glucose, Bld: 89 mg/dL (ref 70–99)
Potassium: 4.5 mEq/L (ref 3.5–5.1)
Sodium: 137 mEq/L (ref 135–145)

## 2012-12-24 LAB — CBC WITH DIFFERENTIAL/PLATELET
Basophils Absolute: 0 10*3/uL (ref 0.0–0.1)
Basophils Relative: 0.3 % (ref 0.0–3.0)
Eosinophils Absolute: 0 10*3/uL (ref 0.0–0.7)
Eosinophils Relative: 0.1 % (ref 0.0–5.0)
HCT: 33.8 % — ABNORMAL LOW (ref 36.0–46.0)
Hemoglobin: 11.7 g/dL — ABNORMAL LOW (ref 12.0–15.0)
Lymphocytes Relative: 16.5 % (ref 12.0–46.0)
Lymphs Abs: 0.7 10*3/uL (ref 0.7–4.0)
MCHC: 34.6 g/dL (ref 30.0–36.0)
MCV: 90.4 fl (ref 78.0–100.0)
Monocytes Absolute: 0.5 10*3/uL (ref 0.1–1.0)
Monocytes Relative: 11.9 % (ref 3.0–12.0)
Neutro Abs: 3.2 10*3/uL (ref 1.4–7.7)
Neutrophils Relative %: 71.2 % (ref 43.0–77.0)
Platelets: 273 10*3/uL (ref 150.0–400.0)
RBC: 3.73 Mil/uL — ABNORMAL LOW (ref 3.87–5.11)
RDW: 12.7 % (ref 11.5–14.6)
WBC: 4.5 10*3/uL (ref 4.5–10.5)

## 2012-12-25 ENCOUNTER — Encounter: Payer: Self-pay | Admitting: Internal Medicine

## 2012-12-26 ENCOUNTER — Encounter: Payer: Self-pay | Admitting: Internal Medicine

## 2012-12-30 ENCOUNTER — Ambulatory Visit (INDEPENDENT_AMBULATORY_CARE_PROVIDER_SITE_OTHER): Payer: Managed Care, Other (non HMO) | Admitting: Podiatry

## 2012-12-30 ENCOUNTER — Encounter: Payer: Self-pay | Admitting: Podiatry

## 2012-12-30 VITALS — BP 124/56 | HR 73 | Resp 16 | Ht 66.0 in | Wt 122.0 lb

## 2012-12-30 DIAGNOSIS — L600 Ingrowing nail: Secondary | ICD-10-CM

## 2012-12-30 DIAGNOSIS — Z79899 Other long term (current) drug therapy: Secondary | ICD-10-CM

## 2012-12-30 LAB — CBC WITH DIFFERENTIAL/PLATELET
Basophils Absolute: 0 10*3/uL (ref 0.0–0.2)
Basos: 1 %
Eos: 2 %
Eosinophils Absolute: 0.1 10*3/uL (ref 0.0–0.4)
HCT: 39 % (ref 34.0–46.6)
Hemoglobin: 13.7 g/dL (ref 11.1–15.9)
Immature Grans (Abs): 0 10*3/uL (ref 0.0–0.1)
Immature Granulocytes: 0 %
Lymphocytes Absolute: 1 10*3/uL (ref 0.7–3.1)
Lymphs: 25 %
MCH: 30.8 pg (ref 26.6–33.0)
MCHC: 35.1 g/dL (ref 31.5–35.7)
MCV: 88 fL (ref 79–97)
Monocytes Absolute: 0.5 10*3/uL (ref 0.1–0.9)
Monocytes: 12 %
Neutrophils Absolute: 2.5 10*3/uL (ref 1.4–7.0)
Neutrophils Relative %: 60 %
RBC: 4.45 x10E6/uL (ref 3.77–5.28)
RDW: 12.6 % (ref 12.3–15.4)
WBC: 4.1 10*3/uL (ref 3.4–10.8)

## 2012-12-30 MED ORDER — TERBINAFINE HCL 250 MG PO TABS: 250.0000 mg | ORAL_TABLET | Freq: Every day | ORAL | Status: DC

## 2012-12-30 NOTE — Progress Notes (Signed)
Adrienne Robinson presents today for followup of her Lamisil therapy she denies fever chills nausea vomiting muscle aches or pains or any inability to heal small cuts or wounds. She's also complaining of a painful ingrown toenail to the hallux right foot along the tibial and fibular border.  Objective: Vital signs are stable she is alert and oriented x3. Pulses are palpable to the right lower extremity. Sharp incurvated nail margin along the tibial and fibular border of the hallux right with onychomycosis to the nail plate. There is erythema and edema associated with this. There is no signs of abscess or infection.  Assessment: Paronychia ingrown nail hallux right. Long-term therapy with Lamisil secondary to onychomycosis.  Plan: Wrote her a 90 day prescription for Lamisil. Also performed a chemical matrixectomy to the tibial and fibular border of the hallux right. This was performed today in the office without complications I will followup with her in one week for reevaluation the toe in 4 months for the onychomycosis. I will call sooner should her liver profile and CBC be of concern.

## 2012-12-30 NOTE — Patient Instructions (Signed)

## 2012-12-31 LAB — HEPATIC FUNCTION PANEL
ALT: 19 IU/L (ref 0–32)
AST: 26 IU/L (ref 0–40)
Albumin: 5.2 g/dL (ref 3.5–5.5)
Alkaline Phosphatase: 88 IU/L (ref 39–117)
Bilirubin, Direct: 0.1 mg/dL (ref 0.00–0.40)
Total Bilirubin: 0.3 mg/dL (ref 0.0–1.2)
Total Protein: 7.2 g/dL (ref 6.0–8.5)

## 2013-01-01 ENCOUNTER — Other Ambulatory Visit: Payer: Self-pay | Admitting: Physical Medicine and Rehabilitation

## 2013-01-01 DIAGNOSIS — M5000 Cervical disc disorder with myelopathy, unspecified cervical region: Secondary | ICD-10-CM

## 2013-01-02 ENCOUNTER — Other Ambulatory Visit (HOSPITAL_COMMUNITY): Payer: Self-pay | Admitting: Interventional Radiology

## 2013-01-02 ENCOUNTER — Telehealth: Payer: Self-pay | Admitting: *Deleted

## 2013-01-02 DIAGNOSIS — R42 Dizziness and giddiness: Secondary | ICD-10-CM

## 2013-01-02 DIAGNOSIS — R51 Headache: Secondary | ICD-10-CM

## 2013-01-02 DIAGNOSIS — Q279 Congenital malformation of peripheral vascular system, unspecified: Secondary | ICD-10-CM

## 2013-01-02 NOTE — Telephone Encounter (Signed)
SPOKE WITH PT TOLD HER PER DR HYATT: BLOOD WORK IS OK AND CONTINUE WITH MEDICATION

## 2013-01-03 ENCOUNTER — Other Ambulatory Visit: Payer: Self-pay | Admitting: Internal Medicine

## 2013-01-03 DIAGNOSIS — Z1239 Encounter for other screening for malignant neoplasm of breast: Secondary | ICD-10-CM

## 2013-01-07 ENCOUNTER — Encounter: Payer: Self-pay | Admitting: Internal Medicine

## 2013-01-07 ENCOUNTER — Telehealth (HOSPITAL_COMMUNITY): Payer: Self-pay | Admitting: Interventional Radiology

## 2013-01-07 ENCOUNTER — Other Ambulatory Visit (HOSPITAL_COMMUNITY): Payer: Self-pay | Admitting: Interventional Radiology

## 2013-01-07 DIAGNOSIS — R519 Headache, unspecified: Secondary | ICD-10-CM

## 2013-01-07 DIAGNOSIS — Q2730 Arteriovenous malformation, site unspecified: Secondary | ICD-10-CM

## 2013-01-07 DIAGNOSIS — R42 Dizziness and giddiness: Secondary | ICD-10-CM

## 2013-01-07 NOTE — Telephone Encounter (Signed)
Called pt left VM for her to call to schedule CTA for f/u JM

## 2013-01-08 MED ORDER — ESCITALOPRAM OXALATE 10 MG PO TABS: 10.0000 mg | ORAL_TABLET | Freq: Every day | ORAL | Status: DC

## 2013-01-09 ENCOUNTER — Ambulatory Visit: Payer: Self-pay | Admitting: Podiatry

## 2013-01-09 ENCOUNTER — Encounter: Payer: Self-pay | Admitting: Internal Medicine

## 2013-01-13 ENCOUNTER — Other Ambulatory Visit: Payer: Managed Care, Other (non HMO)

## 2013-01-15 ENCOUNTER — Other Ambulatory Visit: Payer: Self-pay | Admitting: Internal Medicine

## 2013-01-15 NOTE — Telephone Encounter (Signed)
It is a couple days before her hydrocodone refill.  Would like to come by on Friday to pick up script.  Would like a call to let her know that it will be ready on Friday.

## 2013-01-15 NOTE — Telephone Encounter (Signed)
Ok to refill 

## 2013-01-17 ENCOUNTER — Encounter: Payer: Self-pay | Admitting: *Deleted

## 2013-01-17 ENCOUNTER — Other Ambulatory Visit: Payer: Self-pay | Admitting: Internal Medicine

## 2013-01-17 MED ORDER — HYDROCODONE-ACETAMINOPHEN 10-325 MG PO TABS: 1.0000 | ORAL_TABLET | ORAL | Status: DC | PRN

## 2013-01-17 NOTE — Telephone Encounter (Signed)
Ok to refill.  i will come by office this morning to sign rxs

## 2013-01-22 ENCOUNTER — Encounter: Payer: Self-pay | Admitting: Emergency Medicine

## 2013-02-06 ENCOUNTER — Ambulatory Visit (HOSPITAL_COMMUNITY)
Admission: RE | Admit: 2013-02-06 | Discharge: 2013-02-06 | Disposition: A | Discharge status: Home / Self Care | Payer: Managed Care, Other (non HMO) | Source: Ambulatory Visit | Attending: Interventional Radiology | Admitting: Interventional Radiology

## 2013-02-06 DIAGNOSIS — Z9889 Other specified postprocedural states: Secondary | ICD-10-CM | POA: Insufficient documentation

## 2013-02-06 DIAGNOSIS — Q279 Congenital malformation of peripheral vascular system, unspecified: Secondary | ICD-10-CM

## 2013-02-06 DIAGNOSIS — R51 Headache: Secondary | ICD-10-CM

## 2013-02-06 DIAGNOSIS — R42 Dizziness and giddiness: Secondary | ICD-10-CM | POA: Insufficient documentation

## 2013-02-06 DIAGNOSIS — Q283 Other malformations of cerebral vessels: Secondary | ICD-10-CM | POA: Insufficient documentation

## 2013-02-06 MED ORDER — IOHEXOL 350 MG/ML SOLN
50.0000 mL | Freq: Once | INTRAVENOUS | Status: AC | PRN
  Administered 2013-02-06: 50 mL via INTRAVENOUS

## 2013-02-09 ENCOUNTER — Other Ambulatory Visit: Payer: Self-pay | Admitting: Internal Medicine

## 2013-02-11 ENCOUNTER — Other Ambulatory Visit (HOSPITAL_COMMUNITY): Payer: Self-pay | Admitting: Interventional Radiology

## 2013-02-11 DIAGNOSIS — Q2730 Arteriovenous malformation, site unspecified: Secondary | ICD-10-CM

## 2013-02-14 ENCOUNTER — Other Ambulatory Visit: Payer: Self-pay | Admitting: Internal Medicine

## 2013-02-14 NOTE — Telephone Encounter (Signed)
Refill

## 2013-02-14 NOTE — Telephone Encounter (Signed)
Patient called in requesting her refill for her hydrocodone I let patient know that Dr. Darrick Huntsman was out of the office and she said she doesn't think she will have enough until Monday. Please advise patient.

## 2013-02-16 MED ORDER — HYDROCODONE-ACETAMINOPHEN 10-325 MG PO TABS: 1.0000 | ORAL_TABLET | ORAL | Status: DC | PRN

## 2013-02-16 NOTE — Telephone Encounter (Signed)
Refill ok? 

## 2013-02-17 MED ORDER — HYDROCODONE-ACETAMINOPHEN 10-325 MG PO TABS: 1.0000 | ORAL_TABLET | ORAL | Status: DC | PRN

## 2013-02-17 NOTE — Addendum Note (Signed)
Addended by: Chandra Batch E on: 02/17/2013 09:32 AM   Modules accepted: Orders

## 2013-02-17 NOTE — Telephone Encounter (Signed)
Reprinted Rx.

## 2013-02-18 ENCOUNTER — Ambulatory Visit: Payer: Managed Care, Other (non HMO)

## 2013-02-19 ENCOUNTER — Ambulatory Visit
Admission: RE | Admit: 2013-02-19 | Discharge: 2013-02-19 | Disposition: A | Discharge status: Home / Self Care | Payer: Managed Care, Other (non HMO) | Source: Ambulatory Visit | Attending: Internal Medicine | Admitting: Internal Medicine

## 2013-02-19 DIAGNOSIS — Z1239 Encounter for other screening for malignant neoplasm of breast: Secondary | ICD-10-CM

## 2013-02-21 ENCOUNTER — Ambulatory Visit (HOSPITAL_COMMUNITY): Admission: RE | Admit: 2013-02-21 | Payer: Managed Care, Other (non HMO) | Source: Ambulatory Visit

## 2013-03-04 ENCOUNTER — Encounter: Payer: Self-pay | Admitting: Internal Medicine

## 2013-03-04 DIAGNOSIS — Z90710 Acquired absence of both cervix and uterus: Secondary | ICD-10-CM | POA: Insufficient documentation

## 2013-03-09 MED ORDER — BUPROPION HCL ER (XL) 300 MG PO TB24: ORAL_TABLET | ORAL | Status: DC

## 2013-03-09 MED ORDER — BUPROPION HCL ER (XL) 150 MG PO TB24: ORAL_TABLET | ORAL | Status: DC

## 2013-03-18 ENCOUNTER — Telehealth: Payer: Self-pay | Admitting: Internal Medicine

## 2013-03-18 MED ORDER — HYDROCODONE-ACETAMINOPHEN 10-325 MG PO TABS: 1.0000 | ORAL_TABLET | ORAL | Status: DC | PRN

## 2013-03-18 NOTE — Telephone Encounter (Signed)
Pt aware to p/u after 2pm today

## 2013-03-18 NOTE — Telephone Encounter (Signed)
Ok to refill,  printed rx . I will not be able to sign until after 2

## 2013-03-18 NOTE — Telephone Encounter (Signed)
HYDROcodone-acetaminophen (NORCO) 10-325 MG per tablet

## 2013-03-19 ENCOUNTER — Ambulatory Visit (HOSPITAL_COMMUNITY)
Admission: RE | Admit: 2013-03-19 | Discharge: 2013-03-19 | Disposition: A | Discharge status: Home / Self Care | Payer: Managed Care, Other (non HMO) | Source: Ambulatory Visit | Attending: Interventional Radiology | Admitting: Interventional Radiology

## 2013-03-19 DIAGNOSIS — Q2730 Arteriovenous malformation, site unspecified: Secondary | ICD-10-CM

## 2013-04-02 ENCOUNTER — Encounter: Payer: Self-pay | Admitting: Internal Medicine

## 2013-04-11 ENCOUNTER — Ambulatory Visit (INDEPENDENT_AMBULATORY_CARE_PROVIDER_SITE_OTHER): Payer: Managed Care, Other (non HMO) | Admitting: Internal Medicine

## 2013-04-11 ENCOUNTER — Encounter: Payer: Self-pay | Admitting: Internal Medicine

## 2013-04-11 VITALS — BP 134/72 | HR 72 | Temp 98.1°F | Resp 16 | Wt 130.0 lb

## 2013-04-11 DIAGNOSIS — K2100 Gastro-esophageal reflux disease with esophagitis, without bleeding: Secondary | ICD-10-CM

## 2013-04-11 DIAGNOSIS — D649 Anemia, unspecified: Secondary | ICD-10-CM

## 2013-04-11 DIAGNOSIS — Z79899 Other long term (current) drug therapy: Secondary | ICD-10-CM

## 2013-04-11 DIAGNOSIS — R21 Rash and other nonspecific skin eruption: Secondary | ICD-10-CM

## 2013-04-11 DIAGNOSIS — E785 Hyperlipidemia, unspecified: Secondary | ICD-10-CM

## 2013-04-11 DIAGNOSIS — Q283 Other malformations of cerebral vessels: Secondary | ICD-10-CM

## 2013-04-11 DIAGNOSIS — D709 Neutropenia, unspecified: Secondary | ICD-10-CM

## 2013-04-11 DIAGNOSIS — Z800 Family history of malignant neoplasm of digestive organs: Secondary | ICD-10-CM

## 2013-04-11 DIAGNOSIS — M130 Polyarthritis, unspecified: Secondary | ICD-10-CM

## 2013-04-11 DIAGNOSIS — F32A Depression, unspecified: Secondary | ICD-10-CM

## 2013-04-11 DIAGNOSIS — F329 Major depressive disorder, single episode, unspecified: Secondary | ICD-10-CM

## 2013-04-11 DIAGNOSIS — Q282 Arteriovenous malformation of cerebral vessels: Secondary | ICD-10-CM

## 2013-04-11 DIAGNOSIS — F3289 Other specified depressive episodes: Secondary | ICD-10-CM

## 2013-04-11 DIAGNOSIS — Z1239 Encounter for other screening for malignant neoplasm of breast: Secondary | ICD-10-CM

## 2013-04-11 DIAGNOSIS — I10 Essential (primary) hypertension: Secondary | ICD-10-CM

## 2013-04-11 DIAGNOSIS — E559 Vitamin D deficiency, unspecified: Secondary | ICD-10-CM

## 2013-04-11 LAB — RHEUMATOID FACTOR: Rhuematoid fact SerPl-aCnc: <10 IU/mL (ref <=14)

## 2013-04-11 LAB — COMPREHENSIVE METABOLIC PANEL
ALT: 17 U/L (ref 0–35)
AST: 18 U/L (ref 0–37)
Albumin: 4.6 g/dL (ref 3.5–5.2)
Alkaline Phosphatase: 63 U/L (ref 39–117)
BUN: 16 mg/dL (ref 6–23)
CO2: 27 mEq/L (ref 19–32)
Calcium: 9.9 mg/dL (ref 8.4–10.5)
Chloride: 105 mEq/L (ref 96–112)
Creatinine, Ser: 1.2 mg/dL (ref 0.4–1.2)
GFR: 49.17 mL/min — ABNORMAL LOW (ref >60.00)
Glucose, Bld: 98 mg/dL (ref 70–99)
Potassium: 4.4 mEq/L (ref 3.5–5.1)
Sodium: 139 mEq/L (ref 135–145)
Total Bilirubin: 0.6 mg/dL (ref 0.3–1.2)
Total Protein: 7.1 g/dL (ref 6.0–8.3)

## 2013-04-11 LAB — CBC WITH DIFFERENTIAL/PLATELET
Basophils Absolute: 0 10*3/uL (ref 0.0–0.1)
Basophils Relative: 0.4 % (ref 0.0–3.0)
Eosinophils Absolute: 0 10*3/uL (ref 0.0–0.7)
Eosinophils Relative: 0.6 % (ref 0.0–5.0)
HCT: 38.5 % (ref 36.0–46.0)
Hemoglobin: 12.7 g/dL (ref 12.0–15.0)
Lymphocytes Relative: 27 % (ref 12.0–46.0)
Lymphs Abs: 1.1 10*3/uL (ref 0.7–4.0)
MCHC: 33.1 g/dL (ref 30.0–36.0)
MCV: 92.9 fl (ref 78.0–100.0)
Monocytes Absolute: 0.4 10*3/uL (ref 0.1–1.0)
Monocytes Relative: 9.2 % (ref 3.0–12.0)
Neutro Abs: 2.5 10*3/uL (ref 1.4–7.7)
Neutrophils Relative %: 62.8 % (ref 43.0–77.0)
Platelets: 320 10*3/uL (ref 150.0–400.0)
RBC: 4.14 Mil/uL (ref 3.87–5.11)
RDW: 12.4 % (ref 11.5–14.6)
WBC: 3.9 10*3/uL — ABNORMAL LOW (ref 4.5–10.5)

## 2013-04-11 LAB — VITAMIN B12: Vitamin B-12: 508 pg/mL (ref 211–911)

## 2013-04-11 LAB — SEDIMENTATION RATE: Sed Rate: 12 mm/hr (ref 0–22)

## 2013-04-11 MED ORDER — OXYCODONE-ACETAMINOPHEN 5-325 MG PO TABS: 1.0000 | ORAL_TABLET | Freq: Every day | ORAL | Status: DC

## 2013-04-11 MED ORDER — ETODOLAC ER 600 MG PO TB24: 600.0000 mg | ORAL_TABLET | Freq: Every day | ORAL | Status: DC

## 2013-04-11 MED ORDER — TRIAMCINOLONE ACETONIDE 0.025 % EX OINT: 1.0000 "application " | TOPICAL_OINTMENT | Freq: Two times a day (BID) | CUTANEOUS | Status: DC

## 2013-04-11 MED ORDER — AMLODIPINE BESYLATE 10 MG PO TABS: 10.0000 mg | ORAL_TABLET | Freq: Every day | ORAL | Status: DC

## 2013-04-11 MED ORDER — ZOLPIDEM TARTRATE 10 MG PO TABS: ORAL_TABLET | ORAL | Status: DC

## 2013-04-11 MED ORDER — HYDROCODONE-ACETAMINOPHEN 10-325 MG PO TABS: 1.0000 | ORAL_TABLET | ORAL | Status: DC | PRN

## 2013-04-11 NOTE — Progress Notes (Signed)
Patient ID: Adrienne Robinson, female   DOB: [birth year], 58 y.o.   MRN: [id number]  \ Patient Active Problem List   Diagnosis Date Noted  . Polyarthritis of multiple sites 04/13/2013  . Rash and nonspecific skin eruption 04/13/2013  . S/P abdominal hysterectomy 03/04/2013  . Chronic reflux esophagitis 10/29/2012  . Leukopenia 03/05/2012  . AVM (arteriovenous malformation) brain 01/31/2012  . Need for influenza vaccination 01/28/2012  . Ulcerative colitis with rectal bleeding 10/05/2011  . Family history of colon cancer 10/04/2011  . Hypertension   . Degenerative joint disease involving multiple joints   . Spinal stenosis of lumbar region     Subjective:  CC:   Chief Complaint  Patient presents with  . Follow-up    6 month follow  . Arthritis    is worse.  . Rash    Bilateral legs started saturday night    HPI:   ERYN Robinson is a 58 y.o. 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    Joint pain . She has been having persistent Left thumb pain and swelling, bilateral wrist pain and shoulder pain.  She received bilateral wrist injections for CTS by Dr Marlou Sa  6 months ago and hoped it would help her thumb but it did not mitigate her pain at all. He has advised her to postpone the right shoulder replacement for 13 years, so she is getting a second opinion   Getting shoulder injections by Ernestina Patches MD .  Going to see Dr Theda Sers at [facility] for consideration of arthroscopy .  My prior screening tests for inflammatory arthritis were normal, and the last time we tried a steroid taper (for sinusitis) she had terrible nightmares and agitation so she does not want to try that again.  The pain is waking her up at 3 am despite taking  a vicodin 10/325 at bedtime.  She is not using  alcohol at  Al..   Depression: she had to euthanize her dog Lexie around the time of Christmas . She cannot speak about her without weeping.  She is childless and the loss of her dog has been very difficult.  She has resumed wellbutrin,  But had recurrent palpitations and tachycardia on the 450 mg prior dose which resolved with lower dose of 300 mg daily.  She had 6 month follow up on congenital AVM i December with no radiographic changes and no symptoms of recurrence or complications.    New onset pruritic rash.  Started on chest wall,  Has spread to lower extremities but nowhere else.  No relief with hydrocortisone cream.  No recent changes in saps, detergents or moisturizerss.  No new meds     Past Medical History  Diagnosis Date  . Hypertension   . Depression   . Degenerative joint disease involving multiple joints   . Spinal stenosis of lumbar region   . Anxiety   . Dizziness   . Headache(784.0)     having since  past month and a half  . Pneumonia     approx 6 years ago  . Ulcerative proctitis   . 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 Abdominal hysterectomy    . Oophorectomy    .  Shoulder arthroscopy with rotator cuff repair and subacromial decompression  2007    left  . Nasal sinus surgery    . Appendectomy    . Cardiac catheterization      normal coronaries, no wall motion abnormalities 11/02/09 [facility])  . Radiology with anesthesia  02/26/2012    Procedure: RADIOLOGY WITH ANESTHESIA;  Surgeon: [name], MD;  Location: [place];  Service: Radiology;  Laterality: N/A;  . Av fistula repair    . Tonsillectomy  adnoids       The following portions of the patient's history were reviewed and updated as appropriate: Allergies, current medications, and problem list.    Review of Systems:   Patient denies headache, fevers, malaise, unintentional weight loss,  skin rash, eye pain, sinus congestion and sinus pain, sore throat, dysphagia,  hemoptysis , cough, dyspnea, wheezing, chest pain, palpitations, orthopnea, edema, abdominal pain, nausea, melena, diarrhea, constipation, flank pain, dysuria, hematuria, urinary  Frequency, nocturia, numbness, tingling, seizures,  Focal weakness, Loss of consciousness,  Tremor, insomnia, depression, anxiety, and suicidal ideation.     History   Social History  . Marital Status: Married    Spouse Name: N/A    Number of Children: N/A  . Years of Education: N/A   Occupational History  . Not on file.   Social History Main Topics  . Smoking status: Never Smoker   . Smokeless tobacco: Never Used  . Alcohol Use: 1.2 oz/week    2 Glasses of wine per week     Comment: 2-3 week with dinner  . Drug Use: No  . Sexual Activity: Not on file   Other Topics Concern  . Not on file   Social History Narrative  . No narrative on file    Objective:  Filed Vitals:   04/11/13 0808  BP: 134/72  Pulse: 72  Temp: 98.1 F (36.7 C)  Resp: 16     General appearance: alert, cooperative and appears stated age Ears: normal TM's and external ear canals both ears Throat: lips, mucosa, and tongue normal; teeth and gums normal Neck: no adenopathy, no carotid bruit, supple, symmetrical, trachea midline and thyroid not enlarged, symmetric, no tenderness/mass/nodules Back: symmetric, no curvature. ROM normal. No CVA tenderness. Lungs: clear to auscultation bilaterally Heart: regular rate and rhythm, S1, S2 normal, no murmur, click, rub or gallop Abdomen: soft, non-tender; bowel sounds normal; no masses,  no organomegaly Pulses: 2+ and symmetric Skin:  Multiple annular macular salmon colored blanching patches on chest wall and legs, nonspecific appearing 0.6 mm size  Lymph nodes: Cervical, supraclavicular, and axillary nodes normal.  Assessment and Plan:  Hypertension Well controlled on amlodipine. Renal function stable,  no changes today. Lab Results  Component Value Date   CREATININE 1.2 04/11/2013     AVM (arteriovenous malformation) brain Continues to be asymptomatic after embolization by [name], with last CT angiogram done Dec 2014.  Continue aggressive control of BP.  Chronic reflux esophagitis Managed with daily PPI  Family history of colon cancer Surveillance colonoscopy was postponed due to AVM procedure done Jan 2014.  Will address at next visit  Polyarthritis of multiple sites She has synovitis of the left carpal joint today.  Given her history of ulcerative colitis the probability of an inflammatory arthritis is increased.  Will repeat serolgoeis and refer to Dr Jefm Bryant for evaluation.  Given her inadequate pain control  Regimen changed to add Lodine XR once daily and oxycodone at night only.  Continue daytime vicodin   Depression  She has resumed wellbutrin at a lower dose of 300 mg daily.  Higher dose caused palpitations and tachycardia.   Rash and nonspecific skin eruption Etiology unclear.  Recent onset on chest and legs.  Pruritic, , blanching in appearance.  Triamcinolone ointment prescribed.   A total of 40 minutes was spent with patient more than half of which was spent in counseling, reviewing records from other prviders and coordination of care.   Updated Medication List Outpatient Encounter Prescriptions as of 04/11/2013  Medication Sig  . amLODipine (NORVASC) 10 MG tablet Take 1 tablet (10 mg total) by mouth daily.  Marland Kitchen buPROPion (WELLBUTRIN XL) 300 MG 24 hr tablet TAKE 1 TABLET (300 MG TOTAL) BY MOUTH DAILY.  . fluticasone (FLONASE) 50 MCG/ACT nasal spray USE 2 SPRAYS IN EACH NOSTRIL DAILY AS NEEDED  . HYDROcodone-acetaminophen (NORCO) 10-325 MG per tablet Take 1 tablet by mouth every 4 (four) hours as needed.  . Linaclotide (LINZESS) 290 MCG CAPS capsule Take 1 capsule (290 mcg total) by mouth daily.  . metroNIDAZOLE (METROGEL) 1 % gel Apply topically daily.  .  pantoprazole (PROTONIX) 40 MG tablet Take 40 mg by mouth 2 (two) times daily.  Marland Kitchen zolpidem (AMBIEN) 10 MG tablet TAKE 1 TABLET AT BEDTIME AS NEEDED  . [DISCONTINUED] amLODipine (NORVASC) 10 MG tablet Take 1 tablet (10 mg total) by mouth daily.  . [DISCONTINUED] HYDROcodone-acetaminophen (NORCO) 10-325 MG per tablet Take 1 tablet by mouth every 4 (four) hours as needed.  . [DISCONTINUED] zolpidem (AMBIEN) 10 MG tablet TAKE 1 TABLET AT BEDTIME AS NEEDED  . etodolac (LODINE XL) 600 MG 24 hr tablet Take 1 tablet (600 mg total) by mouth daily.  Marland Kitchen oxyCODONE-acetaminophen (ROXICET) 5-325 MG per tablet Take 1 tablet by mouth at bedtime.  . triamcinolone (KENALOG) 0.025 % ointment Apply 1 application topically 2 (two) times daily.  . [DISCONTINUED] buPROPion (WELLBUTRIN XL) 150 MG 24 hr tablet TAKE 1 TABLET (150 MG TOTAL) BY MOUTH DAILY.  . [DISCONTINUED] escitalopram (LEXAPRO) 10 MG tablet Take 1 tablet (10 mg total) by mouth daily.  . [DISCONTINUED] gabapentin (NEURONTIN) 100 MG capsule TAKE 1 CAPSULE TWICE DAILY AND 3 CAPSULES AT NIGHT  . [DISCONTINUED] Lactulose 20 GM/30ML SOLN Take 30 mLs (20 g total) by mouth every 4 (four) hours as needed. To relieve constipation  . [DISCONTINUED] terbinafine (LAMISIL) 250 MG tablet Take 1 tablet (250 mg total) by mouth daily.     Orders Placed This Encounter  Procedures  . CBC with Differential  . Comprehensive metabolic panel  . Vit D  25 hydroxy (rtn osteoporosis monitoring)  . Sedimentation rate  . HLA-B27 antigen  . Rheumatoid factor  . Lipid panel  . Vitamin B12  . Cyclic citrul peptide antibody, IgG    No Follow-up on file.

## 2013-04-11 NOTE — Progress Notes (Signed)
Pre-visit discussion using our clinic review tool. No additional management support is needed unless otherwise documented below in the visit note.  

## 2013-04-11 NOTE — Patient Instructions (Signed)
Triamcinolone ointment for rash apply twice daily  Etodolac XR for joint pain , instead of ibuprofen,  Once daily  Percocet for nighttimre pain   Rheumatology referral for joint injections thumb if appropriate

## 2013-04-12 LAB — VITAMIN D 25 HYDROXY (VIT D DEFICIENCY, FRACTURES): Vit D, 25-Hydroxy: 41 ng/mL (ref 30–89)

## 2013-04-13 ENCOUNTER — Encounter: Payer: Self-pay | Admitting: Internal Medicine

## 2013-04-13 DIAGNOSIS — M130 Polyarthritis, unspecified: Secondary | ICD-10-CM

## 2013-04-13 DIAGNOSIS — M064 Inflammatory polyarthropathy: Secondary | ICD-10-CM | POA: Insufficient documentation

## 2013-04-13 DIAGNOSIS — R21 Rash and other nonspecific skin eruption: Secondary | ICD-10-CM | POA: Insufficient documentation

## 2013-04-13 NOTE — Assessment & Plan Note (Signed)
She has resumed wellbutrin at a lower dose of 300 mg daily.  Higher dose caused palpitations and tachycardia.

## 2013-04-13 NOTE — Assessment & Plan Note (Signed)
Surveillance colonoscopy was postponed due to AVM procedure done Jan 2014.  Will address at next visit

## 2013-04-13 NOTE — Assessment & Plan Note (Addendum)
She has synovitis of the left carpal joint today.  Given her history of ulcerative colitis the probability of an inflammatory arthritis is increased.  Will repeat serolgoeis and refer to Dr Jefm Bryant for evaluation.  Given her inadequate pain control  Regimen changed to add Lodine XR once daily and oxycodone at night only.  Continue daytime vicodin

## 2013-04-13 NOTE — Assessment & Plan Note (Signed)
Continues to be asymptomatic after embolization by [name], with last CT angiogram done Dec 2014.  Continue aggressive control of BP.

## 2013-04-13 NOTE — Assessment & Plan Note (Addendum)
Well controlled on amlodipine. Renal function stable, no changes today. Lab Results  Component Value Date   CREATININE 1.2 04/11/2013

## 2013-04-13 NOTE — Assessment & Plan Note (Signed)
Etiology unclear.  Recent onset on chest and legs.  Pruritic, , blanching in appearance.  Triamcinolone ointment prescribed.

## 2013-04-13 NOTE — Assessment & Plan Note (Signed)
Managed with daily PPI  

## 2013-04-14 ENCOUNTER — Encounter: Payer: Self-pay | Admitting: Emergency Medicine

## 2013-04-14 ENCOUNTER — Telehealth: Payer: Self-pay | Admitting: Internal Medicine

## 2013-04-14 LAB — LDL CHOLESTEROL, DIRECT: Direct LDL: 160.2 mg/dL

## 2013-04-14 LAB — LIPID PANEL
Cholesterol: 243 mg/dL — ABNORMAL HIGH (ref 0–200)
HDL: 75.2 mg/dL (ref >39.00)
Total CHOL/HDL Ratio: 3
Triglycerides: 90 mg/dL (ref 0.0–149.0)
VLDL: 18 mg/dL (ref 0.0–40.0)

## 2013-04-14 LAB — CYCLIC CITRUL PEPTIDE ANTIBODY, IGG: Cyclic Citrullin Peptide Ab: <2 U/mL (ref 0.0–5.0)

## 2013-04-14 LAB — HLA-B27 ANTIGEN: DNA Result:: NOT DETECTED

## 2013-04-14 NOTE — Telephone Encounter (Signed)
Relevant patient education assigned to patient using Emmi. ° °

## 2013-04-15 ENCOUNTER — Encounter: Payer: Self-pay | Admitting: Internal Medicine

## 2013-04-16 ENCOUNTER — Encounter: Payer: Self-pay | Admitting: Internal Medicine

## 2013-04-22 ENCOUNTER — Encounter: Payer: Self-pay | Admitting: Internal Medicine

## 2013-04-23 MED ORDER — COENZYME Q10 30 MG PO CAPS: 30.0000 mg | ORAL_CAPSULE | Freq: Every day | ORAL | Status: DC

## 2013-04-23 MED ORDER — ATORVASTATIN CALCIUM 20 MG PO TABS: 20.0000 mg | ORAL_TABLET | Freq: Every day | ORAL | Status: DC

## 2013-04-23 NOTE — Telephone Encounter (Signed)
Amber please change the rheumatology referral to dr Estanislado Pandy in [facility]  Per patient preference   thanks

## 2013-04-27 ENCOUNTER — Other Ambulatory Visit: Payer: Self-pay | Admitting: Internal Medicine

## 2013-04-28 ENCOUNTER — Ambulatory Visit: Payer: Managed Care, Other (non HMO) | Admitting: Podiatry

## 2013-04-28 NOTE — Telephone Encounter (Signed)
Ok to refill,  printed rx  

## 2013-04-28 NOTE — Telephone Encounter (Signed)
Ok refill? 

## 2013-04-28 NOTE — Telephone Encounter (Signed)
Script faxed.

## 2013-04-30 NOTE — Telephone Encounter (Signed)
Referral changed, but last I heard she was not taking on any new patients. We will try.

## 2013-05-13 ENCOUNTER — Other Ambulatory Visit: Payer: Self-pay | Admitting: Internal Medicine

## 2013-05-13 NOTE — Telephone Encounter (Signed)
Ok to fill 

## 2013-05-14 ENCOUNTER — Telehealth: Payer: Self-pay | Admitting: Internal Medicine

## 2013-05-14 MED ORDER — HYDROCODONE-ACETAMINOPHEN 10-325 MG PO TABS: 1.0000 | ORAL_TABLET | ORAL | Status: DC | PRN

## 2013-05-14 NOTE — Telephone Encounter (Signed)
HYDROcodone-acetaminophen (NORCO) 10-325 MG per tablet

## 2013-05-14 NOTE — Telephone Encounter (Signed)
Ok to fill? Last fill 2/20 15

## 2013-05-14 NOTE — Telephone Encounter (Signed)
Ok to refill,  printed rx for vicodin

## 2013-05-15 NOTE — Telephone Encounter (Signed)
Pt calling again to check status of hydrocodone refill.  Would like to come by and pick it up.

## 2013-05-15 NOTE — Telephone Encounter (Signed)
Pt called to check status of hydrocodone refill.  Advised pt it is not quite ready.  Pt will call a little later to see if it is ready for pickup.  Pt would like to pick up today if possible, states she is out.

## 2013-05-15 NOTE — Telephone Encounter (Signed)
Fyi.

## 2013-05-15 NOTE — Telephone Encounter (Signed)
Pt advised ready for pick and to pick up herself.  Pt must leave specimen per Carolee Rota.

## 2013-05-28 ENCOUNTER — Ambulatory Visit: Payer: Self-pay | Admitting: Podiatry

## 2013-06-02 ENCOUNTER — Encounter: Payer: Self-pay | Admitting: Emergency Medicine

## 2013-06-09 ENCOUNTER — Encounter: Payer: Self-pay | Admitting: Internal Medicine

## 2013-06-11 ENCOUNTER — Other Ambulatory Visit: Payer: Self-pay | Admitting: Internal Medicine

## 2013-06-11 NOTE — Telephone Encounter (Signed)
Refill

## 2013-06-12 ENCOUNTER — Other Ambulatory Visit: Payer: Self-pay | Admitting: Internal Medicine

## 2013-06-12 NOTE — Telephone Encounter (Signed)
HYDROcodone-acetaminophen (NORCO) 10-325 MG per tablet

## 2013-06-12 NOTE — Telephone Encounter (Signed)
Last visit 04/11/13

## 2013-06-13 MED ORDER — HYDROCODONE-ACETAMINOPHEN 10-325 MG PO TABS: 1.0000 | ORAL_TABLET | ORAL | Status: DC | PRN

## 2013-06-13 NOTE — Telephone Encounter (Signed)
Ok to refill,  printed rx  

## 2013-06-13 NOTE — Telephone Encounter (Signed)
Notified pt Rx ready for pick up 

## 2013-06-18 ENCOUNTER — Encounter (HOSPITAL_COMMUNITY): Payer: Self-pay

## 2013-06-18 ENCOUNTER — Encounter (HOSPITAL_COMMUNITY)
Admission: RE | Admit: 2013-06-18 | Discharge: 2013-06-18 | Disposition: A | Discharge status: Home / Self Care | Payer: 59 | Source: Ambulatory Visit | Attending: Anesthesiology | Admitting: Anesthesiology

## 2013-06-18 ENCOUNTER — Encounter (HOSPITAL_COMMUNITY)
Admission: RE | Admit: 2013-06-18 | Discharge: 2013-06-18 | Disposition: A | Discharge status: Home / Self Care | Payer: 59 | Source: Ambulatory Visit | Attending: Orthopedic Surgery | Admitting: Orthopedic Surgery

## 2013-06-18 DIAGNOSIS — Z01812 Encounter for preprocedural laboratory examination: Secondary | ICD-10-CM | POA: Insufficient documentation

## 2013-06-18 DIAGNOSIS — I10 Essential (primary) hypertension: Secondary | ICD-10-CM | POA: Insufficient documentation

## 2013-06-18 DIAGNOSIS — Z01818 Encounter for other preprocedural examination: Secondary | ICD-10-CM | POA: Insufficient documentation

## 2013-06-18 DIAGNOSIS — Z01810 Encounter for preprocedural cardiovascular examination: Secondary | ICD-10-CM | POA: Insufficient documentation

## 2013-06-18 LAB — BASIC METABOLIC PANEL
BUN: 26 mg/dL — ABNORMAL HIGH (ref 6–23)
CO2: 25 mEq/L (ref 19–32)
Calcium: 9.4 mg/dL (ref 8.4–10.5)
Chloride: 98 mEq/L (ref 96–112)
Creatinine, Ser: 0.93 mg/dL (ref 0.50–1.10)
GFR calc Af Amer: 78 mL/min — ABNORMAL LOW (ref >90)
GFR calc non Af Amer: 67 mL/min — ABNORMAL LOW (ref >90)
Glucose, Bld: 88 mg/dL (ref 70–99)
Potassium: 4.1 mEq/L (ref 3.7–5.3)
Sodium: 136 mEq/L — ABNORMAL LOW (ref 137–147)

## 2013-06-18 LAB — CBC
HCT: 34.8 % — ABNORMAL LOW (ref 36.0–46.0)
Hemoglobin: 12.1 g/dL (ref 12.0–15.0)
MCH: 31 pg (ref 26.0–34.0)
MCHC: 34.8 g/dL (ref 30.0–36.0)
MCV: 89.2 fL (ref 78.0–100.0)
Platelets: 323 10*3/uL (ref 150–400)
RBC: 3.9 MIL/uL (ref 3.87–5.11)
RDW: 13.2 % (ref 11.5–15.5)
WBC: 6.1 10*3/uL (ref 4.0–10.5)

## 2013-06-18 LAB — TYPE AND SCREEN
ABO/RH(D): A POS
Antibody Screen: NEGATIVE

## 2013-06-18 MED ORDER — CHLORHEXIDINE GLUCONATE 4 % EX LIQD: 60.0000 mL | Freq: Once | CUTANEOUS | Status: DC

## 2013-06-18 NOTE — Pre-Procedure Instructions (Signed)
Adrienne Robinson  06/18/2013   Your procedure is scheduled on:  Friday May * th at 0730 AM  Report to [facility] "[facility]" at 0530 AM.  Call this number if you have problems the morning of surgery: [phone]   Remember:   Do not eat food or drink liquids after midnight.   Take these medicines the morning of surgery with A SIP OF WATER: Amlodipine, Wellbutrin, Hydrocodone, and Linzess   Do not wear jewelry, make-up or nail polish.  Do not wear lotions, powders, or perfumes. You may wear deodorant.  Do not shave 48 hours prior to surgery.   Do not bring valuables to the hospital.  [facility] is not responsible for any belongings or valuables.               Contacts, dentures or bridgework may not be worn into surgery.  Leave suitcase in the car. After surgery it may be brought to your room.  For patients admitted to the hospital, discharge time is determined by your  treatment team.               Patients discharged the day of surgery will not be allowed to drive  home.    Special Instructions: [place] - Preparing for Surgery  Before surgery, you can play an important role.  Because skin is not sterile, your skin needs to be as free of germs as possible.  You can reduce the number of germs on you skin by washing with CHG (chlorahexidine gluconate) soap before surgery.  CHG is an antiseptic cleaner which kills germs and bonds with the skin to continue killing germs even after washing.  Please DO NOT use if you have an allergy to CHG or antibacterial soaps.  If your skin becomes reddened/irritated stop using the CHG and inform your nurse when you arrive at Short Stay.  Do not shave (including legs and underarms) for at least 48 hours prior to the first CHG shower.  You may shave your face.  Please follow these instructions carefully:   1.  Shower with CHG Soap the night before surgery and the                                morning of Surgery.  2.  If you choose to wash  your hair, wash your hair first as usual with your       normal shampoo.  3.  After you shampoo, rinse your hair and body thoroughly to remove the                      Shampoo.  4.  Use CHG as you would any other liquid soap.  You can apply chg directly       to the skin and wash gently with scrungie or a clean washcloth.  5.  Apply the CHG Soap to your body ONLY FROM THE NECK DOWN.        Do not use on open wounds or open sores.  Avoid contact with your eyes,       ears, mouth and genitals (private parts).  Wash genitals (private parts)       with your normal soap.  6.  Wash thoroughly, paying special attention to the area where your surgery        will be performed.  7.  Thoroughly rinse your body with warm  water from the neck down.  8.  DO NOT shower/wash with your normal soap after using and rinsing off       the CHG Soap.  9.  Pat yourself dry with a clean towel.            10.  Wear clean pajamas.            11.  Place clean sheets on your bed the night of your first shower and do not        sleep with pets.  Day of Surgery  Do not apply any lotions/deoderants the morning of surgery.  Please wear clean clothes to the hospital/surgery center.      Please read over the following fact sheets that you were given: Pain Booklet, Coughing and Deep Breathing, Blood Transfusion Information and Surgical Site Infection Prevention

## 2013-06-18 NOTE — H&P (Signed)
Adrienne Robinson is an 58 y.o. female.    Chief Complaint: right shoulder pain and weakness  HPI: Pt is a 58 y.o. female complaining of right shoulder pain for multiple years. Pain had continually increased since the beginning. X-rays in the clinic show end-stage arthritic changes of the right shoulder. Pt has tried various conservative treatments which have failed to alleviate their symptoms, including therapy and injections. Various options are discussed with the patient. Risks, benefits and expectations were discussed with the patient. Patient understand the risks, benefits and expectations and wishes to proceed with surgery.   PCP:  Deborra Medina, MD  D/C Plans:  Home   PMH: Past Medical History  Diagnosis Date  . Hypertension   . Depression   . Degenerative joint disease involving multiple joints   . Spinal stenosis of lumbar region   . Anxiety   . Dizziness   . Headache(784.0)     having since  past month and a half  . Pneumonia     approx 6 years ago  . Ulcerative proctitis   . 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 Abdominal hysterectomy    . Oophorectomy    . Shoulder arthroscopy with rotator cuff repair and subacromial decompression  2007    left  . Nasal sinus surgery    . Appendectomy    . Cardiac catheterization      normal coronaries, no wall motion abnormalities 11/02/09 [facility])  . Radiology with anesthesia  02/26/2012    Procedure: RADIOLOGY WITH ANESTHESIA;  Surgeon: [name], MD;  Location: [facility];  Service: Radiology;  Laterality: N/A;  . Av fistula repair    . Tonsillectomy  adnoids    Social History:  reports that she has never smoked. She has never used smokeless tobacco. She reports that she drinks about 1.2 ounces of alcohol per week. She reports that she does not use illicit drugs.  Allergies:  Allergies  Allergen Reactions  .  Morphine And Related Rash  . Adhesive [Tape] Rash and Other (See Comments)    reddness    Medications: No current facility-administered medications for this encounter.   Current Outpatient Prescriptions  Medication Sig Dispense Refill  . ALPRAZolam (XANAX) 0.5 MG tablet TAKE 1 TABLET BY MOUTH AT BEDTIME AS NEEDED FOR SLEEP  30 tablet  2  . amLODipine (NORVASC) 10 MG tablet Take 1 tablet (10 mg total) by mouth daily.  90 tablet  3  . atorvastatin (LIPITOR) 20 MG tablet Take 1 tablet (20 mg total) by mouth daily.  90 tablet  3  . buPROPion (WELLBUTRIN XL) 300 MG 24 hr tablet TAKE 1 TABLET (300 MG TOTAL) BY MOUTH DAILY.  30 tablet  5  . co-enzyme Q-10 30 MG capsule Take 1 capsule (30 mg total) by mouth daily.  90 capsule  3  . etodolac (LODINE XL) 600 MG 24 hr tablet TAKE 1 TABLET BY MOUTH DAILY.  30 tablet  5  . fluticasone (FLONASE) 50 MCG/ACT nasal spray USE 2 SPRAYS IN EACH NOSTRIL DAILY AS NEEDED  16 g  2  . HYDROcodone-acetaminophen (NORCO) 10-325 MG per tablet Take 1 tablet by mouth every 4 (four) hours as needed.  120 tablet  0  . Linaclotide (LINZESS) 290 MCG CAPS capsule Take 1 capsule (290 mcg total) by mouth daily.  30 capsule  11  . metroNIDAZOLE (  METROGEL) 1 % gel Apply topically daily.  45 g  3  . oxyCODONE-acetaminophen (ROXICET) 5-325 MG per tablet Take 1 tablet by mouth at bedtime.  30 tablet  0  . pantoprazole (PROTONIX) 40 MG tablet Take 40 mg by mouth 2 (two) times daily.      Marland Kitchen triamcinolone (KENALOG) 0.025 % ointment Apply 1 application topically 2 (two) times daily.  30 g  0  . zolpidem (AMBIEN) 10 MG tablet TAKE 1 TABLET AT BEDTIME AS NEEDED  30 tablet  5  . [DISCONTINUED] ferrous sulfate 325 (65 FE) MG EC tablet Take 1 tablet (325 mg total) by mouth daily after breakfast.  30 tablet  3  . [DISCONTINUED] metFORMIN (GLUCOPHAGE) 500 MG tablet Take 500 mg by mouth 2 (two) times daily with a meal.          No results found for this or any previous visit (from the past 48  hour(s)). No results found.  ROS: Pain with rom of the right upper extremity  Physical Exam:  Alert and oriented 58 y.o. female in no acute distress Cranial nerves 2-12 intact Cervical spine: full rom with no tenderness, nv intact distally Chest: active breath sounds bilaterally, no wheeze rhonchi or rales Heart: regular rate and rhythm, no murmur Abd: non tender non distended with active bowel sounds Hip is stable with rom  Right shoulder with limited rom and strength Crepitus with rom nv intact distally No rashes or edema  Assessment/Plan Assessment: right shoulder rotator cuff insufficiency  Plan: Patient will undergo a right reverse total shoulder arthroplasty by Dr. Veverly Fells at [facility]. Risks benefits and expectations were discussed with the patient. Patient understand risks, benefits and expectations and wishes to proceed.

## 2013-06-19 LAB — ABO/RH: ABO/RH(D): A POS

## 2013-06-19 NOTE — Progress Notes (Signed)
Anesthesia chart review: Patient is a 58 year old female scheduled for right reverse total shoulder arthroplasty on 06/27/13 by Dr. Veverly Fells.    History includes nonsmoker, hypertension, ulcerative colitis, pneumonia, anxiety, depression, headaches, nasal sinus surgery, hysterectomy, lumbar spinal stenosis, normal coronaries by cath '11 [facility]), liquid embolization of distal right anterior cerebral artery AVM 02/26/12 by Dr. Estanislado Pandy. Dr. Estanislado Pandy cleared patient for surgery from his standpoint. PCP is Dr. Deborra Medina.   EKG on 06/18/13 showed NSR, possible LAE.   Cardiac cath at [facility] on 11/02/09 showed normal coronaries, no LV global or regional wall motion abnormalities (Dr. Bartholome Bill). (Report scanned under Media tab, Correspondence, Encounter 02/26/12.)  Stress echo on 04/14/08 at [facility] showed normal LV systolic function with EF of 50%. Normal treadmill EKG without evidence of ischemia or arrhythmia. Normal stress echocardiographic images without evidence of myocardial ischemia. Slightly decreased exercise tolerance for age. (Report scanned under Media tab, Correspondence, Encounter 02/26/12.)  CXR on 06/18/13 showed no acute cardiopulmonary abnormalities.   Preoperative labs noted.  If no acute changes then I anticipate that she can proceed as planned.  [facility] Short Stay Center/Anesthesiology Phone [phone] 06/19/2013 12:06 PM

## 2013-06-23 ENCOUNTER — Encounter: Payer: Self-pay | Admitting: Internal Medicine

## 2013-06-26 MED ORDER — CEFAZOLIN SODIUM-DEXTROSE 2-3 GM-% IV SOLR
2.0000 g | INTRAVENOUS | Status: AC
  Administered 2013-06-27: 2 g via INTRAVENOUS
  Filled 2013-06-26: qty 50

## 2013-06-27 ENCOUNTER — Inpatient Hospital Stay (HOSPITAL_COMMUNITY)
Admission: RE | Admit: 2013-06-27 | Discharge: 2013-06-28 | DRG: 483 | Disposition: A | Discharge status: Home / Self Care | Payer: 59 | Source: Ambulatory Visit | Attending: Orthopedic Surgery | Admitting: Orthopedic Surgery

## 2013-06-27 ENCOUNTER — Encounter (HOSPITAL_COMMUNITY)
Admission: RE | Disposition: A | Discharge status: Home / Self Care | Payer: Self-pay | Source: Ambulatory Visit | Attending: Orthopedic Surgery

## 2013-06-27 ENCOUNTER — Inpatient Hospital Stay (HOSPITAL_COMMUNITY): Payer: 59

## 2013-06-27 ENCOUNTER — Encounter (HOSPITAL_COMMUNITY): Payer: 59 | Admitting: Vascular Surgery

## 2013-06-27 ENCOUNTER — Encounter (HOSPITAL_COMMUNITY): Payer: Self-pay | Admitting: *Deleted

## 2013-06-27 ENCOUNTER — Ambulatory Visit (HOSPITAL_COMMUNITY): Payer: 59 | Admitting: Anesthesiology

## 2013-06-27 DIAGNOSIS — Z9089 Acquired absence of other organs: Secondary | ICD-10-CM

## 2013-06-27 DIAGNOSIS — F411 Generalized anxiety disorder: Secondary | ICD-10-CM | POA: Diagnosis present

## 2013-06-27 DIAGNOSIS — F3289 Other specified depressive episodes: Secondary | ICD-10-CM | POA: Diagnosis present

## 2013-06-27 DIAGNOSIS — F329 Major depressive disorder, single episode, unspecified: Secondary | ICD-10-CM | POA: Diagnosis present

## 2013-06-27 DIAGNOSIS — I10 Essential (primary) hypertension: Secondary | ICD-10-CM | POA: Diagnosis present

## 2013-06-27 DIAGNOSIS — Z885 Allergy status to narcotic agent status: Secondary | ICD-10-CM

## 2013-06-27 DIAGNOSIS — Z79899 Other long term (current) drug therapy: Secondary | ICD-10-CM

## 2013-06-27 DIAGNOSIS — M19019 Primary osteoarthritis, unspecified shoulder: Principal | ICD-10-CM | POA: Diagnosis present

## 2013-06-27 HISTORY — PX: REVERSE SHOULDER ARTHROPLASTY: SHX5054

## 2013-06-27 SURGERY — ARTHROPLASTY, SHOULDER, TOTAL, REVERSE
Anesthesia: General | Site: Shoulder | Laterality: Right

## 2013-06-27 MED ORDER — MIDAZOLAM HCL 5 MG/5ML IJ SOLN
INTRAMUSCULAR | Status: DC | PRN
  Administered 2013-06-27: 2 mg via INTRAVENOUS

## 2013-06-27 MED ORDER — LACTATED RINGERS IV SOLN
INTRAVENOUS | Status: DC | PRN
  Administered 2013-06-27: 07:00:00 via INTRAVENOUS

## 2013-06-27 MED ORDER — FENTANYL CITRATE 0.05 MG/ML IJ SOLN
INTRAMUSCULAR | Status: AC
  Filled 2013-06-27: qty 5

## 2013-06-27 MED ORDER — LINACLOTIDE 290 MCG PO CAPS
290.0000 ug | ORAL_CAPSULE | Freq: Every day | ORAL | Status: DC
  Administered 2013-06-28: 290 ug via ORAL
  Filled 2013-06-27: qty 1

## 2013-06-27 MED ORDER — NEOSTIGMINE METHYLSULFATE 10 MG/10ML IV SOLN
INTRAVENOUS | Status: AC
  Filled 2013-06-27: qty 1

## 2013-06-27 MED ORDER — MIDAZOLAM HCL 2 MG/2ML IJ SOLN
INTRAMUSCULAR | Status: AC
Start: 2013-06-27 — End: 2013-06-27
  Filled 2013-06-27: qty 2

## 2013-06-27 MED ORDER — HYDROCODONE-ACETAMINOPHEN 7.5-325 MG PO TABS: 1.0000 | ORAL_TABLET | Freq: Four times a day (QID) | ORAL | Status: DC | PRN

## 2013-06-27 MED ORDER — LIDOCAINE HCL (CARDIAC) 20 MG/ML IV SOLN
INTRAVENOUS | Status: AC
  Filled 2013-06-27: qty 5

## 2013-06-27 MED ORDER — ZOLPIDEM TARTRATE 5 MG PO TABS
5.0000 mg | ORAL_TABLET | Freq: Every evening | ORAL | Status: DC | PRN
  Administered 2013-06-27: 5 mg via ORAL
  Filled 2013-06-27 (×2): qty 1

## 2013-06-27 MED ORDER — PHENYLEPHRINE HCL 10 MG/ML IJ SOLN
10.0000 mg | INTRAMUSCULAR | Status: DC | PRN
  Administered 2013-06-27: 10 ug/min via INTRAVENOUS

## 2013-06-27 MED ORDER — METOCLOPRAMIDE HCL 10 MG PO TABS: 5.0000 mg | ORAL_TABLET | Freq: Three times a day (TID) | ORAL | Status: DC | PRN

## 2013-06-27 MED ORDER — ONDANSETRON HCL 4 MG/2ML IJ SOLN
INTRAMUSCULAR | Status: DC | PRN
  Administered 2013-06-27: 4 mg via INTRAVENOUS

## 2013-06-27 MED ORDER — PROPOFOL 10 MG/ML IV BOLUS
INTRAVENOUS | Status: DC | PRN
  Administered 2013-06-27: 140 mg via INTRAVENOUS

## 2013-06-27 MED ORDER — ETODOLAC 300 MG PO CAPS
300.0000 mg | ORAL_CAPSULE | Freq: Two times a day (BID) | ORAL | Status: DC
Start: 2013-06-27 — End: 2013-06-28
  Administered 2013-06-28: 300 mg via ORAL
  Filled 2013-06-27 (×4): qty 1

## 2013-06-27 MED ORDER — METHOCARBAMOL 500 MG PO TABS
500.0000 mg | ORAL_TABLET | Freq: Four times a day (QID) | ORAL | Status: DC | PRN
  Administered 2013-06-27 (×2): 500 mg via ORAL
  Filled 2013-06-27 (×3): qty 1

## 2013-06-27 MED ORDER — GLYCOPYRROLATE 0.2 MG/ML IJ SOLN
INTRAMUSCULAR | Status: AC
  Filled 2013-06-27: qty 2

## 2013-06-27 MED ORDER — ACETAMINOPHEN 650 MG RE SUPP: 650.0000 mg | Freq: Four times a day (QID) | RECTAL | Status: DC | PRN

## 2013-06-27 MED ORDER — PHENOL 1.4 % MT LIQD: 1.0000 | OROMUCOSAL | Status: DC | PRN

## 2013-06-27 MED ORDER — HYDROMORPHONE HCL PF 1 MG/ML IJ SOLN
0.5000 mg | INTRAMUSCULAR | Status: DC | PRN
  Administered 2013-06-27 – 2013-06-28 (×4): 0.5 mg via INTRAVENOUS
  Filled 2013-06-27 (×4): qty 1

## 2013-06-27 MED ORDER — EPHEDRINE SULFATE 50 MG/ML IJ SOLN
INTRAMUSCULAR | Status: AC
Start: 2013-06-27 — End: 2013-06-27
  Filled 2013-06-27: qty 1

## 2013-06-27 MED ORDER — FLUTICASONE PROPIONATE 50 MCG/ACT NA SUSP: 2.0000 | Freq: Every day | NASAL | Status: DC | PRN

## 2013-06-27 MED ORDER — AMLODIPINE BESYLATE 10 MG PO TABS
10.0000 mg | ORAL_TABLET | Freq: Every day | ORAL | Status: DC
  Administered 2013-06-28: 10 mg via ORAL
  Filled 2013-06-27: qty 1

## 2013-06-27 MED ORDER — GLYCOPYRROLATE 0.2 MG/ML IJ SOLN
INTRAMUSCULAR | Status: DC | PRN
  Administered 2013-06-27: 0.4 mg via INTRAVENOUS

## 2013-06-27 MED ORDER — ROCURONIUM BROMIDE 50 MG/5ML IV SOLN
INTRAVENOUS | Status: AC
  Filled 2013-06-27: qty 1

## 2013-06-27 MED ORDER — CEFAZOLIN SODIUM-DEXTROSE 2-3 GM-% IV SOLR
2.0000 g | Freq: Four times a day (QID) | INTRAVENOUS | Status: AC
  Administered 2013-06-27 (×3): 2 g via INTRAVENOUS
  Filled 2013-06-27 (×3): qty 50

## 2013-06-27 MED ORDER — FENTANYL CITRATE 0.05 MG/ML IJ SOLN
INTRAMUSCULAR | Status: DC | PRN
  Administered 2013-06-27: 100 ug via INTRAVENOUS
  Administered 2013-06-27 (×2): 50 ug via INTRAVENOUS

## 2013-06-27 MED ORDER — ONDANSETRON HCL 4 MG/2ML IJ SOLN: 4.0000 mg | Freq: Four times a day (QID) | INTRAMUSCULAR | Status: DC | PRN

## 2013-06-27 MED ORDER — METHOCARBAMOL 1000 MG/10ML IJ SOLN
500.0000 mg | Freq: Four times a day (QID) | INTRAMUSCULAR | Status: DC | PRN
  Filled 2013-06-27: qty 5

## 2013-06-27 MED ORDER — KETOROLAC TROMETHAMINE 30 MG/ML IJ SOLN
30.0000 mg | Freq: Once | INTRAMUSCULAR | Status: AC
  Administered 2013-06-27: 30 mg via INTRAVENOUS

## 2013-06-27 MED ORDER — ONDANSETRON HCL 4 MG PO TABS
4.0000 mg | ORAL_TABLET | Freq: Four times a day (QID) | ORAL | Status: DC | PRN
  Administered 2013-06-28: 4 mg via ORAL
  Filled 2013-06-27: qty 1

## 2013-06-27 MED ORDER — BUPROPION HCL ER (XL) 300 MG PO TB24
300.0000 mg | ORAL_TABLET | Freq: Every day | ORAL | Status: DC
  Administered 2013-06-28: 300 mg via ORAL
  Filled 2013-06-27 (×2): qty 1

## 2013-06-27 MED ORDER — ONDANSETRON HCL 4 MG/2ML IJ SOLN
INTRAMUSCULAR | Status: AC
  Filled 2013-06-27: qty 2

## 2013-06-27 MED ORDER — KETOROLAC TROMETHAMINE 30 MG/ML IJ SOLN
INTRAMUSCULAR | Status: AC
  Filled 2013-06-27: qty 1

## 2013-06-27 MED ORDER — SODIUM CHLORIDE 0.9 % IJ SOLN
INTRAMUSCULAR | Status: AC
  Filled 2013-06-27: qty 10

## 2013-06-27 MED ORDER — METHOCARBAMOL 500 MG PO TABS: 500.0000 mg | ORAL_TABLET | Freq: Three times a day (TID) | ORAL | Status: DC | PRN

## 2013-06-27 MED ORDER — METOCLOPRAMIDE HCL 5 MG/ML IJ SOLN: 5.0000 mg | Freq: Three times a day (TID) | INTRAMUSCULAR | Status: DC | PRN

## 2013-06-27 MED ORDER — HYDROCODONE-ACETAMINOPHEN 10-325 MG PO TABS
1.0000 | ORAL_TABLET | Freq: Four times a day (QID) | ORAL | Status: DC | PRN
  Administered 2013-06-27 – 2013-06-28 (×3): 1 via ORAL
  Filled 2013-06-27 (×3): qty 1

## 2013-06-27 MED ORDER — LIDOCAINE HCL (CARDIAC) 20 MG/ML IV SOLN
INTRAVENOUS | Status: DC | PRN
  Administered 2013-06-27: 30 mg via INTRAVENOUS

## 2013-06-27 MED ORDER — ETODOLAC ER 600 MG PO TB24: 600.0000 mg | ORAL_TABLET | Freq: Every day | ORAL | Status: DC

## 2013-06-27 MED ORDER — BUPIVACAINE-EPINEPHRINE 0.25% -1:200000 IJ SOLN
INTRAMUSCULAR | Status: DC | PRN
  Administered 2013-06-27: 20 mL

## 2013-06-27 MED ORDER — ROCURONIUM BROMIDE 100 MG/10ML IV SOLN
INTRAVENOUS | Status: DC | PRN
  Administered 2013-06-27: 40 mg via INTRAVENOUS

## 2013-06-27 MED ORDER — TETRAHYDROZOLINE HCL 0.05 % OP SOLN: 1.0000 [drp] | Freq: Two times a day (BID) | OPHTHALMIC | Status: DC | PRN

## 2013-06-27 MED ORDER — DEXAMETHASONE SODIUM PHOSPHATE 4 MG/ML IJ SOLN
INTRAMUSCULAR | Status: DC | PRN
  Administered 2013-06-27: 4 mg via INTRAVENOUS

## 2013-06-27 MED ORDER — SODIUM CHLORIDE 0.9 % IV SOLN
INTRAVENOUS | Status: DC
  Administered 2013-06-27: 50 mL/h via INTRAVENOUS

## 2013-06-27 MED ORDER — DEXAMETHASONE SODIUM PHOSPHATE 4 MG/ML IJ SOLN
INTRAMUSCULAR | Status: AC
  Filled 2013-06-27: qty 1

## 2013-06-27 MED ORDER — PROPOFOL 10 MG/ML IV BOLUS
INTRAVENOUS | Status: AC
  Filled 2013-06-27: qty 20

## 2013-06-27 MED ORDER — ACETAMINOPHEN 325 MG PO TABS: 650.0000 mg | ORAL_TABLET | Freq: Four times a day (QID) | ORAL | Status: DC | PRN

## 2013-06-27 MED ORDER — MENTHOL 3 MG MT LOZG: 1.0000 | LOZENGE | OROMUCOSAL | Status: DC | PRN

## 2013-06-27 MED ORDER — NEOSTIGMINE METHYLSULFATE 10 MG/10ML IV SOLN
INTRAVENOUS | Status: DC | PRN
  Administered 2013-06-27: 3 mg via INTRAVENOUS

## 2013-06-27 MED ORDER — PHENYLEPHRINE HCL 10 MG/ML IJ SOLN
INTRAMUSCULAR | Status: DC | PRN
  Administered 2013-06-27 (×2): 80 ug via INTRAVENOUS
  Administered 2013-06-27: 120 ug via INTRAVENOUS

## 2013-06-27 MED ORDER — BUPIVACAINE-EPINEPHRINE (PF) 0.25% -1:200000 IJ SOLN
INTRAMUSCULAR | Status: AC
  Filled 2013-06-27: qty 30

## 2013-06-27 SURGICAL SUPPLY — 68 items
BIT DRILL 170X2.5X (BIT) IMPLANT
BIT DRL 170X2.5X (BIT)
BLADE 10 SAFETY STRL DISP (BLADE) ×2 IMPLANT
BLADE SAG 18X100X1.27 (BLADE) ×2 IMPLANT
CAPT SHOULD DELTAXTEND CEM MOD ×1 IMPLANT
COVER SURGICAL LIGHT HANDLE (MISCELLANEOUS) ×2 IMPLANT
DRAPE INCISE IOBAN 66X45 STRL (DRAPES) ×6 IMPLANT
DRAPE U-SHAPE 47X51 STRL (DRAPES) ×2 IMPLANT
DRAPE X-RAY CASS 24X20 (DRAPES) IMPLANT
DRILL 2.5 (BIT)
DRSG ADAPTIC 3X8 NADH LF (GAUZE/BANDAGES/DRESSINGS) ×2 IMPLANT
DRSG PAD ABDOMINAL 8X10 ST (GAUZE/BANDAGES/DRESSINGS) ×3 IMPLANT
DURAPREP 26ML APPLICATOR (WOUND CARE) ×2 IMPLANT
ELECT BLADE 4.0 EZ CLEAN MEGAD (MISCELLANEOUS) ×2
ELECT NDL TIP 2.8 STRL (NEEDLE) ×1 IMPLANT
ELECT NEEDLE TIP 2.8 STRL (NEEDLE) ×2 IMPLANT
ELECT REM PT RETURN 9FT ADLT (ELECTROSURGICAL) ×2
ELECTRODE BLDE 4.0 EZ CLN MEGD (MISCELLANEOUS) ×1 IMPLANT
ELECTRODE REM PT RTRN 9FT ADLT (ELECTROSURGICAL) ×1 IMPLANT
GLOVE BIOGEL PI ORTHO PRO 7.5 (GLOVE) ×1
GLOVE BIOGEL PI ORTHO PRO SZ8 (GLOVE) ×1
GLOVE ORTHO TXT STRL SZ7.5 (GLOVE) ×2 IMPLANT
GLOVE PI ORTHO PRO STRL 7.5 (GLOVE) ×1 IMPLANT
GLOVE PI ORTHO PRO STRL SZ8 (GLOVE) ×1 IMPLANT
GLOVE SURG ORTHO 8.5 STRL (GLOVE) ×2 IMPLANT
GOWN STRL REUS W/ TWL LRG LVL3 (GOWN DISPOSABLE) ×1 IMPLANT
GOWN STRL REUS W/ TWL XL LVL3 (GOWN DISPOSABLE) ×2 IMPLANT
GOWN STRL REUS W/TWL LRG LVL3 (GOWN DISPOSABLE) ×2
GOWN STRL REUS W/TWL XL LVL3 (GOWN DISPOSABLE) ×4
HANDPIECE INTERPULSE COAX TIP (DISPOSABLE)
KIT BASIN OR (CUSTOM PROCEDURE TRAY) ×2 IMPLANT
KIT ROOM TURNOVER OR (KITS) ×2 IMPLANT
MANIFOLD NEPTUNE II (INSTRUMENTS) ×2 IMPLANT
NDL 1/2 CIR MAYO (NEEDLE) ×1 IMPLANT
NDL HYPO 25GX1X1/2 BEV (NEEDLE) ×1 IMPLANT
NEEDLE 1/2 CIR MAYO (NEEDLE) ×4 IMPLANT
NEEDLE HYPO 25GX1X1/2 BEV (NEEDLE) ×2 IMPLANT
NS IRRIG 1000ML POUR BTL (IV SOLUTION) ×2 IMPLANT
PACK SHOULDER (CUSTOM PROCEDURE TRAY) ×2 IMPLANT
PAD ABD 8X10 STRL (GAUZE/BANDAGES/DRESSINGS) ×1 IMPLANT
PAD ARMBOARD 7.5X6 YLW CONV (MISCELLANEOUS) ×4 IMPLANT
PIN GUIDE 1.2 (PIN) IMPLANT
PIN GUIDE GLENOPHERE 1.5MX300M (PIN) IMPLANT
PIN METAGLENE 2.5 (PIN) IMPLANT
SET HNDPC FAN SPRY TIP SCT (DISPOSABLE) IMPLANT
SLING ARM LRG ADULT FOAM STRAP (SOFTGOODS) IMPLANT
SLING ARM MED ADULT FOAM STRAP (SOFTGOODS) IMPLANT
SPACER 38 PLUS 3 (Spacer) IMPLANT
SPONGE GAUZE 4X4 12PLY (GAUZE/BANDAGES/DRESSINGS) ×3 IMPLANT
SPONGE GAUZE 4X4 12PLY STER LF (GAUZE/BANDAGES/DRESSINGS) ×1 IMPLANT
SPONGE LAP 18X18 X RAY DECT (DISPOSABLE) ×2 IMPLANT
SPONGE LAP 4X18 X RAY DECT (DISPOSABLE) ×3 IMPLANT
STRIP CLOSURE SKIN 1/2X4 (GAUZE/BANDAGES/DRESSINGS) ×2 IMPLANT
SUCTION FRAZIER TIP 10 FR DISP (SUCTIONS) ×2 IMPLANT
SUT FIBERWIRE #2 38 T-5 BLUE (SUTURE) ×4
SUT MNCRL AB 4-0 PS2 18 (SUTURE) ×2 IMPLANT
SUT VIC AB 2-0 CT1 27 (SUTURE) ×2
SUT VIC AB 2-0 CT1 TAPERPNT 27 (SUTURE) ×1 IMPLANT
SUT VICRYL 0 CT 1 36IN (SUTURE) ×2 IMPLANT
SUTURE FIBERWR #2 38 T-5 BLUE (SUTURE) ×2 IMPLANT
SYR CONTROL 10ML LL (SYRINGE) ×2 IMPLANT
TAPE PAPER 3X10 WHT MICROPORE (GAUZE/BANDAGES/DRESSINGS) ×1 IMPLANT
TOWEL OR 17X24 6PK STRL BLUE (TOWEL DISPOSABLE) ×2 IMPLANT
TOWEL OR 17X26 10 PK STRL BLUE (TOWEL DISPOSABLE) ×2 IMPLANT
TOWER CARTRIDGE SMART MIX (DISPOSABLE) IMPLANT
TRAY FOLEY CATH 16FRSI W/METER (SET/KITS/TRAYS/PACK) ×2 IMPLANT
WATER STERILE IRR 1000ML POUR (IV SOLUTION) ×2 IMPLANT
YANKAUER SUCT BULB TIP NO VENT (SUCTIONS) IMPLANT

## 2013-06-27 NOTE — Transfer of Care (Signed)
Immediate Anesthesia Transfer of Care Note  Patient: Adrienne Robinson  Procedure(s) Performed: Procedure(s): REVERSE RIGHT TOTAL SHOULDER ARTHROPLASTY (Right)  Patient Location: PACU  Anesthesia Type:GA combined with regional for post-op pain  Level of Consciousness: awake, alert  and oriented  Airway & Oxygen Therapy: Patient Spontanous Breathing and Patient connected to nasal cannula oxygen  Post-op Assessment: Report given to PACU RN  Post vital signs: Reviewed and stable  Complications: No apparent anesthesia complications

## 2013-06-27 NOTE — Anesthesia Procedure Notes (Addendum)
Procedure Name: Intubation Date/Time: 06/27/2013 7:37 AM Performed by: Kyung Rudd Pre-anesthesia Checklist: Patient identified, Emergency Drugs available, Suction available, Patient being monitored and Timeout performed Patient Re-evaluated:Patient Re-evaluated prior to inductionOxygen Delivery Method: Circle system utilized Preoxygenation: Pre-oxygenation with 100% oxygen Intubation Type: IV induction Ventilation: Mask ventilation without difficulty and Oral airway inserted - appropriate to patient size Laryngoscope Size: Mac and 3 Grade View: Grade I Tube type: Oral Tube size: 7.0 mm Number of attempts: 1 Airway Equipment and Method: Stylet Placement Confirmation: ETT inserted through vocal cords under direct vision,  positive ETCO2 and breath sounds checked- equal and bilateral Secured at: 21 cm Tube secured with: Tape Dental Injury: Teeth and Oropharynx as per pre-operative assessment    Anesthesia Regional Block:  Interscalene brachial plexus block  Pre-Anesthetic Checklist: ,, timeout performed, Correct Patient, Correct Site, Correct Laterality, Correct Procedure, Correct Position, site marked, Risks and benefits discussed,  Surgical consent,  Pre-op evaluation,  At surgeon's request and post-op pain management  Laterality: Right     Needles:  Injection technique: Single-shot  Needle Type: Echogenic Stimulator Needle     Needle Length: 9cm 9 cm Needle Gauge: 22 and 22 G    Additional Needles:  Procedures: ultrasound guided (picture in chart) and nerve stimulator Interscalene brachial plexus block Narrative:  Start time: 06/27/2013 7:20 AM End time: 06/27/2013 7:25 AM Injection made incrementally with aspirations every 5 mL.  Performed by: Personally   Additional Notes: 20 cc 0.5% marcaine injected easily

## 2013-06-27 NOTE — Anesthesia Preprocedure Evaluation (Addendum)
Anesthesia Evaluation  Patient identified by MRN, date of birth, ID band Patient awake    Reviewed: Allergy & Precautions, H&P , NPO status , Patient's Chart, lab work & pertinent test results  Airway Mallampati: II      Dental  (+) Teeth Intact   Pulmonary          Cardiovascular hypertension, Pt. on medications     Neuro/Psych    GI/Hepatic   Endo/Other    Renal/GU      Musculoskeletal   Abdominal   Peds  Hematology   Anesthesia Other Findings   Reproductive/Obstetrics                          Anesthesia Physical Anesthesia Plan  ASA: II  Anesthesia Plan: General   Post-op Pain Management:    Induction: Intravenous  Airway Management Planned: Oral ETT  Additional Equipment:   Intra-op Plan:   Post-operative Plan: Extubation in OR  Informed Consent: I have reviewed the patients History and Physical, chart, labs and discussed the procedure including the risks, benefits and alternatives for the proposed anesthesia with the patient or authorized representative who has indicated his/her understanding and acceptance.   Dental advisory given  Plan Discussed with: CRNA and Anesthesiologist  Anesthesia Plan Comments:         Anesthesia Quick Evaluation

## 2013-06-27 NOTE — Brief Op Note (Signed)
06/27/2013  9:40 AM  PATIENT:  Adrienne Robinson  58 y.o. female  PRE-OPERATIVE DIAGNOSIS:  right shoulder rotator cuff tear arthropathy  POST-OPERATIVE DIAGNOSIS:  same  PROCEDURE:  Procedure(s): REVERSE RIGHT TOTAL SHOULDER ARTHROPLASTY (Right)  DePuy Delta Extend  SURGEON:  Surgeon(s) and Role:    * [name], MD - Primary  PHYSICIAN ASSISTANT:   ASSISTANTS: Ventura Bruns, PA-C   ANESTHESIA:   regional and general  EBL:  Total I/O In: 800 [I.V.:800] Out: 200 [Blood:200]  BLOOD ADMINISTERED:none  DRAINS: none   LOCAL MEDICATIONS USED:  MARCAINE     SPECIMEN:  No Specimen  DISPOSITION OF SPECIMEN:  N/A  COUNTS:  YES  TOURNIQUET:  * No tourniquets in log *  DICTATION: .Other Dictation: Dictation Number [id number]  PLAN OF CARE: Admit to inpatient   PATIENT DISPOSITION:  PACU - hemodynamically stable.   Delay start of Pharmacological VTE agent (>24hrs) due to surgical blood loss or risk of bleeding: no

## 2013-06-27 NOTE — Discharge Instructions (Signed)
Ok to move the shoulder but do not lift with the shoulder.  May remove the sling in the house, but keep it on while out of the home or while up walking around.   Please keep the incision clean and dry for one week, then ok to get wet in the shower  Follow up in two weeks in the office with Dr Veverly Fells   [phone]

## 2013-06-27 NOTE — Progress Notes (Signed)
Utilization review completed.  

## 2013-06-27 NOTE — Interval H&P Note (Signed)
History and Physical Interval Note:  06/27/2013 7:25 AM  Adrienne Robinson  has presented today for surgery, with the diagnosis of right shoulder rotator insuffeienct   The various methods of treatment have been discussed with the patient and family. After consideration of risks, benefits and other options for treatment, the patient has consented to  Procedure(s): REVERSE RIGHT TOTAL SHOULDER ARTHROPLASTY (Right) as a surgical intervention .  The patient's history has been reviewed, patient examined, no change in status, stable for surgery.  I have reviewed the patient's chart and labs.  Questions were answered to the patient's satisfaction.     [name]

## 2013-06-27 NOTE — Anesthesia Postprocedure Evaluation (Signed)
  Anesthesia Post-op Note  Patient: Adrienne Robinson  Procedure(s) Performed: Procedure(s): REVERSE RIGHT TOTAL SHOULDER ARTHROPLASTY (Right)  Patient Location: PACU  Anesthesia Type:General and GA combined with regional for post-op pain  Level of Consciousness: awake, alert  and oriented  Airway and Oxygen Therapy: Patient Spontanous Breathing and Patient connected to nasal cannula oxygen  Post-op Pain: none  Post-op Assessment: Post-op Vital signs reviewed, Patient's Cardiovascular Status Stable, Respiratory Function Stable, Patent Airway and Pain level controlled  Post-op Vital Signs: stable  Last Vitals:  Filed Vitals:   06/27/13 1207  BP: 116/73  Pulse: 70  Temp: 36.3 C  Resp: 18    Complications: No apparent anesthesia complications

## 2013-06-28 LAB — BASIC METABOLIC PANEL
BUN: 13 mg/dL (ref 6–23)
CO2: 21 mEq/L (ref 19–32)
Calcium: 8.7 mg/dL (ref 8.4–10.5)
Chloride: 102 mEq/L (ref 96–112)
Creatinine, Ser: 0.87 mg/dL (ref 0.50–1.10)
GFR calc Af Amer: 84 mL/min — ABNORMAL LOW (ref >90)
GFR calc non Af Amer: 73 mL/min — ABNORMAL LOW (ref >90)
Glucose, Bld: 123 mg/dL — ABNORMAL HIGH (ref 70–99)
Potassium: 3.3 mEq/L — ABNORMAL LOW (ref 3.7–5.3)
Sodium: 139 mEq/L (ref 137–147)

## 2013-06-28 LAB — HEMOGLOBIN AND HEMATOCRIT, BLOOD
HCT: 28.4 % — ABNORMAL LOW (ref 36.0–46.0)
Hemoglobin: 10 g/dL — ABNORMAL LOW (ref 12.0–15.0)

## 2013-06-28 MED ORDER — OXYCODONE HCL 5 MG PO TABS
10.0000 mg | ORAL_TABLET | ORAL | Status: DC | PRN
  Administered 2013-06-28 (×3): 10 mg via ORAL
  Filled 2013-06-28 (×3): qty 2

## 2013-06-28 MED ORDER — HYDROMORPHONE HCL PF 1 MG/ML IJ SOLN
1.0000 mg | INTRAMUSCULAR | Status: DC | PRN
  Administered 2013-06-28: 1 mg via INTRAVENOUS
  Filled 2013-06-28: qty 1

## 2013-06-28 NOTE — Progress Notes (Addendum)
Occupational Therapy Treatment Patient Details Name: Adrienne Robinson MRN: [id number] DOB: 11/28/1955 Today's Date: 06/28/2013    History of present illness 58 y.o. s/p REVERSE RIGHT TOTAL SHOULDER ARTHROPLASTY (Right)   OT comments  Reviewed information with pt and spouse. Ready for d/c.  Follow Up Recommendations  No OT follow up;Supervision - Intermittent    Equipment Recommendations  None recommended by OT    Recommendations for Other Services      Precautions / Restrictions Precautions Precautions: Shoulder Type of Shoulder Precautions: no abduction (talked to PA).  Shoulder Interventions: Shoulder sling/immobilizer;For comfort Precaution Booklet Issued: Yes (comment) Precaution Comments: Reviewed precautions with pt Required Braces or Orthoses: Sling       Mobility Bed Mobility Overal bed mobility: Modified Independent                Transfers Overall transfer level: Modified independent Equipment used: None                  Balance                                   ADL Overall ADL's : Needs assistance/impaired                 Upper Body Dressing : Minimal assistance;Standing (sling and shirt)   Lower Body Dressing: Supervision/safety;Set up (standing)   Toilet Transfer: Modified Independent           Functional mobility during ADLs: Modified independent General ADL Comments: Recommended spouse be with pt if she dons pants in standing. Reviewed information/exercises with pt and spouse. Pt asked about straightening arm out in bed and did this and OT verified okay to do so. Told pt no abduction of RUE. Explained and demonstrated pendulums and stressed to try to let body move RUE versus AROM.      Vision                     Perception     Praxis      Cognition   Behavior During Therapy: [facility] for tasks assessed/performed Overall Cognitive Status: Within Functional Limits for tasks assessed                        Extremity/Trunk Assessment               Exercises Donning/doffing shirt without moving shoulder: Minimal assistance Method for sponge bathing under operated UE: Patient able to independently direct caregiver Donning/doffing sling/immobilizer: Patient able to independently direct caregiver Correct positioning of sling/immobilizer: Patient able to independently direct caregiver Pendulum exercises (written home exercise program):  (discussed) ROM for elbow, wrist and digits of operated UE: Patient able to independently direct caregiver Positioning of UE while sleeping:  (educated)   Shoulder Instructions Shoulder Instructions Donning/doffing shirt without moving shoulder: Minimal assistance Method for sponge bathing under operated UE: Patient able to independently direct caregiver Donning/doffing sling/immobilizer: Patient able to independently direct caregiver Correct positioning of sling/immobilizer: Patient able to independently direct caregiver Pendulum exercises (written home exercise program):  (discussed) ROM for elbow, wrist and digits of operated UE: Patient able to independently direct caregiver Positioning of UE while sleeping:  (educated)     General Comments      Pertinent Vitals/ Pain       Pain indicated but not rated. Increased activity during session.  Home Living  Prior Functioning/Environment              Frequency Min 2X/week     Progress Toward Goals  OT Goals(current goals can now be found in the care plan section)  Progress towards OT goals: Progressing toward goals  Acute Rehab OT Goals Patient Stated Goal: go home OT Goal Formulation: With patient Time For Goal Achievement: 07/05/13 Potential to Achieve Goals: Good ADL Goals Additional ADL Goal #1: Pt/caregiver will be independent with ADLs while maintaining shoulder precautions. Additional ADL Goal #2: Pt will be  independent with HEP for Rt shoulder.   Plan Discharge plan remains appropriate    Co-evaluation                 End of Session     Activity Tolerance Patient tolerated treatment well   Patient Left in bed;with family/visitor present   Nurse Communication Other (comment) (ready for d/c)        Time: 1211-1224 OT Time Calculation (min): 13 min  Charges: OT General Charges $OT Visit: 1 Procedure OT Treatments $Self Care/Home Management : 8-22 mins  Benito Mccreedy OTR/L [phone] 06/28/2013, 1:12 PM

## 2013-06-28 NOTE — Op Note (Signed)
NAMETAYLOUR, LIETZKE NO.:  [id number]  MEDICAL RECORD NO.:  [id number]  LOCATION:  5N04C                        FACILITY:  [facility]  PHYSICIAN:  Doran Heater. Veverly Fells, M.D. DATE OF BIRTH:  09/14/1955  DATE OF PROCEDURE:  06/27/2013 DATE OF DISCHARGE:  06/27/2013                              OPERATIVE REPORT   PREOPERATIVE DIAGNOSIS:  Right shoulder rotator cuff tear arthropathy.  POSTOP DIAGNOSIS:  Right shoulder rotator cuff tear arthropathy.  PROCEDURE PERFORMED:  Right shoulder reverse total shoulder arthroplasty using DePuy Delta Xtend prosthesis.  ATTENDING SURGEON:  Doran Heater. Veverly Fells, MD.  ASSISTANT:  Abbott Pao. Dixon, P.A., who has scrubbed during the entire procedure and necessary for satisfactory completion of surgery.  ANESTHESIA:  General anesthesia was used plus interscalene block.  ESTIMATED BLOOD LOSS:  Minimal.  FLUID REPLACEMENT:  1200 mL crystalloids.  INSTRUMENT COUNTS:  Correct.  COMPLICATIONS:  There were no complications.  ANTIBIOTICS:  Perioperative antibiotics were given.  INDICATIONS:  The patient is a 58 year old female with worsening right shoulder pain secondary to rotator cuff tear arthropathy.  The patient has superior head migration, acetabularization of the acromion, and debilitating pain.  We discussed options for management including continued conservative management versus surgical treatment.  Even given her young age of 85, the patient elected to proceed with surgery to restore function, eliminate pain to her shoulder.  Informed consent obtained.  DESCRIPTION OF PROCEDURE:  After an adequate level of anesthesia was achieved, the patient was positioned in a modified beach-chair position. Right shoulder was correctly identified and sterilely prepped and draped in the usual manner.  Time-out called.  We entered the shoulder using standard deltopectoral incision, starting at the coracoid process, extending down into the  anterior humeral shaft.  Cephalic vein identified, taken laterally with the deltoid, pectoralis taken medially. Conjoined tendon identified and retracted medially.  We identified the subscapularis and reflected that off the lesser tuberosity subperiosteally intact, therefore repaired at the end.  The entire rotator cuff was torn including the supraspinatus, infraspinatus, teres minor tendons.  We then delivered the humeral head out of the wound.  We entered the humeral shaft using hand reamers, starting at size 6 and reaming up to size 10.  We then went ahead and prepared our metaphyseal portion, set on size 1 right.  We then went ahead and milled for that proximal metaphyseal portion of the prosthesis and then placed our trial prosthesis with a 10 body and a size 1 right metaphyseal portion.  We set this at 10 degrees of retroversion.  We then removed the trial components.  We went ahead and subluxed the humerus posteriorly and then did our glenoid preparation.  We first removed all capsule from around the glenoid face.  We removed the labrum and then removed the remaining cartilage on the glenoid itself.  We then placed our central guide pin and then milled for the metaglene, reamed for the metaglene down to good bone.  We had appropriate positioning of the metaglene.  We drilled out our central PEG hole for the metaglene and then impacted the metaglene into position.  We were able to get three screws, 36 inferiorly, 36 into the  coracoid base, and then an 18 nonlocked posteriorly.  We locked this and superior and inferior screws in place.  We had good purchase and security of the metaglene.  We then placed our Nitinol guide wire and then a 38 standard glenosphere was attached securely and screwed into position.  The axillary nerve was protected during this portion of the procedure.  We are pleased with the position of the glenosphere.  We then went ahead and trialed with the size 10 body  and a size 1 right epiphysis and 10 degrees of retroversion with a 38 +3 poly.  We reduced that and felt like that was appropriately tight.  We then went ahead and exchanged our trial body for the real body, which was a hydroxyapatite coated size 10 stem press-fit with a size 1 right set on zero setting on the implant and placed a 10 degrees retroversion.  We used cement impaction grafting technique along the medial portion of the proximal shaft, as we impacted that prosthesis into place.  We were happy with the security and eversion and then trialed again with +3 and actually selected the 38 +6 poly and inserted that into position, reduced the shoulder.  I am pleased with our soft tissue tensioning.  Negative Shuck.  No gapping with external rotation and the conjoint was nice and tight.  We thoroughly irrigated.  We then repaired subscap.  We placed #2 FiberWire through drill holes prior to placing our stem, so we could repair the subscap, and we used also sutures through the bone.  We had an anatomic repair of subscap.  At this point, we were pleased with the security and stability of the implant.  We thoroughly irrigated and closed deltopectoral interval with 0 Vicryl suture followed by 2-0 Vicryl subcutaneous closure and 4-0 Monocryl for skin.  Steri-Strips applied followed by sterile dressing and a shoulder sling.  The patient tolerated the procedure well.     Doran Heater. Veverly Fells, M.D.     SRN/MEDQ  D:  06/27/2013  T:  06/28/2013  Job:  [id number]

## 2013-06-28 NOTE — Progress Notes (Signed)
Written and verbal d/c instructions provided to both patient and husband. Both verbalize understanding. Prescriptions x3 given to husband at patients request. r sholder cleansed with betatdine, new dressing placed and coverroll placed over mepilex for sationary security

## 2013-06-28 NOTE — Progress Notes (Signed)
Orthopedics Progress Note  Subjective:Still in pain this morning.  Can I go home??  Objective:  Filed Vitals:   06/28/13 0526  BP: 121/65  Pulse: 69  Temp: 98.1 F (36.7 C)  Resp: 18    General: Awake and alert  Musculoskeletal: right shoulder incision clean and dry  Neurovascularly intact  Lab Results  Component Value Date   WBC 6.1 06/18/2013   HGB 10.0* 06/28/2013   HCT 28.4* 06/28/2013   MCV 89.2 06/18/2013   PLT 323 06/18/2013       Component Value Date/Time   NA 139 06/28/2013 0540   K 3.3* 06/28/2013 0540   CL 102 06/28/2013 0540   CO2 21 06/28/2013 0540   GLUCOSE 123* 06/28/2013 0540   BUN 13 06/28/2013 0540   CREATININE 0.87 06/28/2013 0540   CREATININE 0.92 08/04/2011 0831   CALCIUM 8.7 06/28/2013 0540   GFRNONAA 73* 06/28/2013 0540   GFRNONAA 70 08/04/2011 0831   GFRAA 84* 06/28/2013 0540   GFRAA 81 08/04/2011 0831    Lab Results  Component Value Date   INR 0.95 07/12/2012   INR 0.95 02/27/2012   INR 0.92 02/23/2012    Assessment/Plan: POD # s/p Procedure(s): REVERSE RIGHT TOTAL SHOULDER ARTHROPLASTY D/C home today  Doran Heater. Veverly Fells, MD 06/28/2013 7:29 AM

## 2013-06-28 NOTE — Discharge Summary (Signed)
Physician Discharge Summary   Patient ID: Adrienne Robinson MRN: [id number] DOB/AGE: 08/08/1955 58 y.o.  Admit date: 06/27/2013 Discharge date: 06/28/2013  Admission Diagnoses:  Active Problems:   Osteoarthrosis, unspecified whether generalized or localized, shoulder region   Discharge Diagnoses:  Same   Surgeries: Procedure(s): REVERSE RIGHT TOTAL SHOULDER ARTHROPLASTY on 06/27/2013   Consultants: OT  Discharged Condition: Stable  Hospital Course: Adrienne Robinson is an 58 y.o. female who was admitted 06/27/2013 with a chief complaint of right shoulder pain, and found to have a diagnosis of right shoulder rotator cuff tear arthropathy.  They were brought to the operating room on 06/27/2013 and underwent the above named procedures.    The patient had an uncomplicated hospital course and was stable for discharge.  Recent vital signs:  Filed Vitals:   06/28/13 0526  BP: 121/65  Pulse: 69  Temp: 98.1 F (36.7 C)  Resp: 18    Recent laboratory studies:  Results for orders placed during the hospital encounter of 06/27/13  HEMOGLOBIN AND HEMATOCRIT, BLOOD      Result Value Ref Range   Hemoglobin 10.0 (*) 12.0 - 15.0 g/dL   HCT 28.4 (*) 36.0 - 50.0 %  BASIC METABOLIC PANEL      Result Value Ref Range   Sodium 139  137 - 147 mEq/L   Potassium 3.3 (*) 3.7 - 5.3 mEq/L   Chloride 102  96 - 112 mEq/L   CO2 21  19 - 32 mEq/L   Glucose, Bld 123 (*) 70 - 99 mg/dL   BUN 13  6 - 23 mg/dL   Creatinine, Ser 0.87  0.50 - 1.10 mg/dL   Calcium 8.7  8.4 - 10.5 mg/dL   GFR calc non Af Amer 73 (*) >90 mL/min   GFR calc Af Amer 84 (*) >90 mL/min    Discharge Medications:     Medication List         amLODipine 10 MG tablet  Commonly known as:  NORVASC  Take 1 tablet (10 mg total) by mouth daily.     buPROPion 300 MG 24 hr tablet  Commonly known as:  WELLBUTRIN XL  Take 300 mg by mouth daily.     etodolac 600 MG 24 hr tablet  Commonly known as:  LODINE XL  Take 600 mg by mouth  daily.     fluticasone 50 MCG/ACT nasal spray  Commonly known as:  FLONASE  Place 2 sprays into both nostrils daily.     HYDROcodone-acetaminophen 10-325 MG per tablet  Commonly known as:  NORCO  Take 1 tablet by mouth every 6 (six) hours as needed for moderate pain.     HYDROcodone-acetaminophen 7.5-325 MG per tablet  Commonly known as:  NORCO  Take 1-2 tablets by mouth every 6 (six) hours as needed for moderate pain.     Linaclotide 290 MCG Caps capsule  Commonly known as:  LINZESS  Take 1 capsule (290 mcg total) by mouth daily.     methocarbamol 500 MG tablet  Commonly known as:  ROBAXIN  Take 1 tablet (500 mg total) by mouth 3 (three) times daily as needed for muscle spasms.     VISINE OP  Place 2 drops into both eyes daily as needed (dry eyes).     zolpidem 10 MG tablet  Commonly known as:  AMBIEN  Take 10 mg by mouth at bedtime as needed for sleep.        Diagnostic Studies: Dg Chest 2 View  06/18/2013   CLINICAL DATA:  Hypertension.  EXAM: CHEST  2 VIEW  COMPARISON:  April 13, 2012.  FINDINGS: The heart size and mediastinal contours are within normal limits. Both lungs are clear. Old right rib fractures are noted.  IMPRESSION: No acute cardiopulmonary abnormality seen.   Electronically Signed   By: Sabino Dick M.D.   On: 06/18/2013 15:57   Dg Shoulder Right Port  06/27/2013   CLINICAL DATA:  Shoulder arthroplasty  EXAM: PORTABLE RIGHT SHOULDER - 2+ VIEW  COMPARISON:  06/18/2013  FINDINGS: Two views show complete arthroplasty of the right shoulder. Humeral cup and glenoid ball appear grossly well positioned. No radiographically detectable complication.  IMPRESSION: Total joint replacement right shoulder without apparent complication.   Electronically Signed   By: Nelson Chimes M.D.   On: 06/27/2013 10:41    Disposition: 06-Home-Health Care Svc       Future Appointments Provider Department Dept Phone   07/18/2013 8:15 AM [name], MD [facility] [phone]      Follow-up Information   Follow up with Charletha Dalpe,STEVEN R, MD In 2 weeks. [phone])    Specialty:  Orthopedic Surgery   Contact information:   [address] [place] [address] [phone]        Signed: [name] 06/28/2013, 7:31 AM

## 2013-06-28 NOTE — Evaluation (Signed)
Occupational Therapy Evaluation Patient Details Name: Adrienne Robinson MRN: [id number] DOB: 03/30/1955 Today's Date: 06/28/2013    History of Present Illness 58 y.o. s/p REVERSE RIGHT TOTAL SHOULDER ARTHROPLASTY (Right)   Clinical Impression   Pt s/p above procedure. Education provided during session and pt performed exercises. Plan to see one more session to review information.     Follow Up Recommendations  No OT follow up;Supervision - Intermittent    Equipment Recommendations  None recommended by OT    Recommendations for Other Services       Precautions / Restrictions Precautions Precautions: Shoulder Type of Shoulder Precautions: no abduction (talked to PA).  Shoulder Interventions: Shoulder sling/immobilizer;For comfort Precaution Booklet Issued: Yes (comment) Precaution Comments: Reviewed precautions with pt Required Braces or Orthoses: Sling Restrictions Weight Bearing Restrictions: Yes RUE Weight Bearing: Non weight bearing      Mobility Bed Mobility Overal bed mobility: Needs Assistance Bed Mobility: Supine to Sit;Sit to Supine     Supine to sit: Supervision Sit to supine: Modified independent (Device/Increase time)   General bed mobility comments: cues to be sure to maintain NWB  Transfers Overall transfer level: Needs assistance Equipment used: None Transfers: Sit to/from Stand Sit to Stand: Supervision              Balance                                            ADL Overall ADL's : Needs assistance/impaired                         Toilet Transfer: Supervision/safety;Ambulation           Functional mobility during ADLs: Supervision/safety (due to IV) General ADL Comments: Reviewed shoulder information with pt on handout. Performed exercises.     Vision                     Perception     Praxis      Pertinent Vitals/Pain Pain 6/10. Repositioned and ice applied.     Hand Dominance  Right   Extremity/Trunk Assessment Upper Extremity Assessment Upper Extremity Assessment: RUE deficits/detail RUE Deficits / Details: s/p Reverse TSA   Lower Extremity Assessment Lower Extremity Assessment: Overall WFL for tasks assessed       Communication Communication Communication: No difficulties   Cognition Arousal/Alertness: Awake/alert Behavior During Therapy: WFL for tasks assessed/performed Overall Cognitive Status: Within Functional Limits for tasks assessed                     General Comments       Exercises Exercises: Shoulder     Shoulder Instructions Shoulder Instructions Donning/doffing shirt without moving shoulder:  (educated on technique) Method for sponge bathing under operated UE:  (educated on technique) Donning/doffing sling/immobilizer:  (OT demonstrated) Correct positioning of sling/immobilizer:  (educated) Pendulum exercises (written home exercise program): Moderate assistance (difficult time relaxing arm and letting body momentum move RUE) ROM for elbow, wrist and digits of operated UE: Supervision/safety Sling wearing schedule (on at all times/off for ADL's):  (educated for comfort) Proper positioning of operated UE when showering:  (educated) Positioning of UE while sleeping:  (educated)    Home Living Family/patient expects to be discharged to:: Private residence Living Arrangements: Spouse/significant other Available Help at Discharge: Family Type of Home: House  Home Layout: Two level;Able to live on main level with bedroom/bathroom   Alternate Level Stairs-Rails: Left Bathroom Shower/Tub: Tub only;Walk-in shower   Bathroom Toilet: Standard                Prior Functioning/Environment Level of Independence: Independent             OT Diagnosis: Acute pain   OT Problem List: Decreased knowledge of use of DME or AE;Decreased knowledge of precautions;Pain;Impaired UE functional use;Decreased strength   OT  Treatment/Interventions: Self-care/ADL training;DME and/or AE instruction;Therapeutic activities;Patient/family education;Balance training;Therapeutic exercise    OT Goals(Current goals can be found in the care plan section) Acute Rehab OT Goals Patient Stated Goal: go home OT Goal Formulation: With patient Time For Goal Achievement: 07/05/13 Potential to Achieve Goals: Good ADL Goals Additional ADL Goal #1: Pt/caregiver will be independent with ADLs while maintaining shoulder precautions. Additional ADL Goal #2: Pt will be independent with HEP for Rt shoulder.   OT Frequency: Min 2X/week   Barriers to D/C:            Co-evaluation              End of Session    Activity Tolerance: Patient tolerated treatment well Patient left: in bed;with call bell/phone within reach   Time: 4128-7867 OT Time Calculation (min): 25 min Charges:  OT General Charges $OT Visit: 1 Procedure OT Evaluation $Initial OT Evaluation Tier I: 1 Procedure OT Treatments $Therapeutic Exercise: 8-22 mins G-Codes:    Benito Mccreedy OTR/L [phone] 06/28/2013, 9:14 AM

## 2013-06-30 ENCOUNTER — Encounter (HOSPITAL_COMMUNITY): Payer: Self-pay | Admitting: Orthopedic Surgery

## 2013-07-16 ENCOUNTER — Telehealth: Payer: Self-pay | Admitting: Internal Medicine

## 2013-07-16 NOTE — Telephone Encounter (Signed)
Last fill was 06/27/13 60 tablets please advise as to refill on hydrocodone.

## 2013-07-16 NOTE — Telephone Encounter (Signed)
Pt states she needs refill on hydrocodone.  States she left vm yesterday regarding refill.  Pt wanting to pick up today.  States she is out as of today.  Asking for a call when ready for pick up.

## 2013-07-16 NOTE — Telephone Encounter (Signed)
It is too early.  If she is escalating her use,  She will need to make an appt.

## 2013-07-16 NOTE — Telephone Encounter (Signed)
Patient notified and voiced understanding.

## 2013-07-18 ENCOUNTER — Ambulatory Visit: Payer: 59 | Admitting: Internal Medicine

## 2013-07-30 ENCOUNTER — Encounter: Payer: Self-pay | Admitting: Internal Medicine

## 2013-07-31 MED ORDER — HYDROCODONE-ACETAMINOPHEN 7.5-325 MG PO TABS: 1.0000 | ORAL_TABLET | Freq: Four times a day (QID) | ORAL | Status: DC | PRN

## 2013-07-31 NOTE — Telephone Encounter (Signed)
I have responded to her e mail.  Refill authorized. For vicodin #[id number]

## 2013-08-07 ENCOUNTER — Other Ambulatory Visit (HOSPITAL_COMMUNITY): Payer: Self-pay | Admitting: Interventional Radiology

## 2013-08-07 ENCOUNTER — Telehealth (HOSPITAL_COMMUNITY): Payer: Self-pay | Admitting: Interventional Radiology

## 2013-08-07 DIAGNOSIS — I729 Aneurysm of unspecified site: Secondary | ICD-10-CM

## 2013-08-07 MED ORDER — HYDROCODONE-ACETAMINOPHEN 10-325 MG PO TABS: 1.0000 | ORAL_TABLET | Freq: Four times a day (QID) | ORAL | Status: DC | PRN

## 2013-08-07 NOTE — Telephone Encounter (Signed)
Called pt, left VM to call and schedule 6 month f/u MRI/MRA brain [place]

## 2013-08-07 NOTE — Telephone Encounter (Signed)
Spoke with pt, she has already filled the earlier Rx. What would you like me to tell her?

## 2013-08-07 NOTE — Telephone Encounter (Signed)
Patient will return the unused vicodin rx for this one.

## 2013-08-08 ENCOUNTER — Telehealth: Payer: Self-pay | Admitting: Internal Medicine

## 2013-08-08 NOTE — Telephone Encounter (Signed)
Pt notified Rx ready for pickup 

## 2013-08-08 NOTE — Telephone Encounter (Signed)
Ok to release the 10 mg rx to her  She can use this rx first and start the taper as previously directed, and swtich to the 5 mg oxycodone when she has run out of the 10 mg

## 2013-08-18 ENCOUNTER — Other Ambulatory Visit: Payer: Self-pay | Admitting: Internal Medicine

## 2013-08-18 DIAGNOSIS — F411 Generalized anxiety disorder: Secondary | ICD-10-CM

## 2013-08-18 NOTE — Telephone Encounter (Signed)
Last refill 5.29.15, last OV 2.20.15.  Please advise refill

## 2013-08-19 DIAGNOSIS — F411 Generalized anxiety disorder: Secondary | ICD-10-CM | POA: Insufficient documentation

## 2013-08-19 NOTE — Telephone Encounter (Signed)
Ok to refill,  printed rx  

## 2013-09-03 ENCOUNTER — Encounter: Payer: Self-pay | Admitting: Internal Medicine

## 2013-09-05 ENCOUNTER — Other Ambulatory Visit: Payer: Self-pay | Admitting: Internal Medicine

## 2013-09-05 DIAGNOSIS — I10 Essential (primary) hypertension: Secondary | ICD-10-CM

## 2013-09-09 ENCOUNTER — Other Ambulatory Visit (HOSPITAL_COMMUNITY): Payer: Self-pay | Admitting: Interventional Radiology

## 2013-09-09 ENCOUNTER — Other Ambulatory Visit: Payer: Self-pay | Admitting: Internal Medicine

## 2013-09-09 DIAGNOSIS — I729 Aneurysm of unspecified site: Secondary | ICD-10-CM

## 2013-09-10 ENCOUNTER — Other Ambulatory Visit: Payer: Self-pay | Admitting: Internal Medicine

## 2013-09-10 DIAGNOSIS — I10 Essential (primary) hypertension: Secondary | ICD-10-CM

## 2013-09-11 ENCOUNTER — Encounter: Payer: Self-pay | Admitting: Internal Medicine

## 2013-09-11 ENCOUNTER — Telehealth: Payer: Self-pay | Admitting: Internal Medicine

## 2013-09-11 MED ORDER — LISINOPRIL 5 MG PO TABS: 5.0000 mg | ORAL_TABLET | Freq: Every day | ORAL | Status: DC

## 2013-09-11 NOTE — Telephone Encounter (Signed)
Patient Information:  Caller Name: Latressa  Phone: [phone]  Patient: Adrienne Robinson, Adrienne Robinson  Gender: Female  DOB: 05/11/1955  Age: 58 Years  PCP: Deborra Medina (Adults only)  Office Follow Up:  Does the office need to follow up with this patient?: No  Instructions For The Office: N/A  RN Note:  patient calling regarding elevated blood pressure at rheumatologist office.  States "Dr. Derrel Nip is aware and says I'm maxed out on my Norvasc.  I need lisinopril or something to get my blood pressure down before the weekend."  Patient declines triage stating she will send a message in [place] requesting change in B/P med to Dr. Derrel Nip.  Symptoms  Reason For Call & Symptoms: blood pressure  Reviewed Health History In EMR: Yes  Reviewed Medications In EMR: Yes  Reviewed Allergies In EMR: Yes  Reviewed Surgeries / Procedures: Yes  Date of Onset of Symptoms: 09/11/2013  Guideline(s) Used:  No Protocol Available - Information Only  Disposition Per Guideline:   Home Care  Reason For Disposition Reached:   Information only question and nurse able to answer  Advice Given:  N/A  Patient Refused Recommendation:  Patient Will Follow Up With Office Later  Unable to triage patient states she will contact provider [place]

## 2013-09-11 NOTE — Telephone Encounter (Signed)
FYI

## 2013-09-11 NOTE — Telephone Encounter (Signed)
I have e mailed her and sent lisinopril to [facility]

## 2013-09-15 ENCOUNTER — Telehealth: Payer: Self-pay | Admitting: Internal Medicine

## 2013-09-15 ENCOUNTER — Encounter: Payer: Self-pay | Admitting: *Deleted

## 2013-09-15 ENCOUNTER — Encounter (INDEPENDENT_AMBULATORY_CARE_PROVIDER_SITE_OTHER): Payer: 59

## 2013-09-15 ENCOUNTER — Other Ambulatory Visit: Payer: Self-pay | Admitting: *Deleted

## 2013-09-15 DIAGNOSIS — I10 Essential (primary) hypertension: Secondary | ICD-10-CM

## 2013-09-15 NOTE — Progress Notes (Signed)
Patient ID: Adrienne Robinson, female   DOB: [birth year], 58 y.o.   MRN: [id number] 24 hour ambulatory blood pressure monitor applied to patient.

## 2013-09-15 NOTE — Telephone Encounter (Signed)
Called and spoke to pt regarding unread mychart message, verbalized understanding.

## 2013-09-15 NOTE — Telephone Encounter (Signed)
I have tried everything to put in order for 24 hour BP monitoring please advise

## 2013-09-15 NOTE — Telephone Encounter (Signed)
Adrienne Robinson,  Please call [name] from [facility] and find oyut EXACTLY HOW TO ORDER THE 24 HOUR AMBULATORY BLOOD PRESSURE MONITOR FOR THIS PATIENT.WE ARE GETTING NOWHERE

## 2013-09-15 NOTE — Telephone Encounter (Signed)
Adrienne Robinson from [facility] called in and said that pt is getting a 24 hr blood pressure monitor and she has had 2 referrals put in for it but it actually needs to be an order put in for it.

## 2013-09-16 ENCOUNTER — Encounter: Payer: Self-pay | Admitting: Internal Medicine

## 2013-09-16 ENCOUNTER — Ambulatory Visit (HOSPITAL_COMMUNITY)
Admission: RE | Admit: 2013-09-16 | Discharge: 2013-09-16 | Disposition: A | Discharge status: Home / Self Care | Payer: 59 | Source: Ambulatory Visit | Attending: Interventional Radiology | Admitting: Interventional Radiology

## 2013-09-16 DIAGNOSIS — I729 Aneurysm of unspecified site: Secondary | ICD-10-CM

## 2013-09-16 NOTE — Telephone Encounter (Signed)
Will fax letter to [facility]. Thanks!

## 2013-09-16 NOTE — Telephone Encounter (Signed)
The patient was seen at the [facility] heart care office yesterday and was hooked up to the 24 hour monitor.  The office called and stated they are in need of a signed order from the provider's office stating the patient is needing this 24 hour holter (spelling?) monitor for the patient, due to hypertension.  They asked in the notes of the order, if it could be noted that this monitor is for blood pressure monitoring.

## 2013-09-16 NOTE — Telephone Encounter (Signed)
i have drafted a letter and sent it to you  .  Again I have no idea how to order it in [employer] ,  That is the problem!!!!

## 2013-09-18 ENCOUNTER — Encounter: Payer: Self-pay | Admitting: Internal Medicine

## 2013-09-19 ENCOUNTER — Encounter: Payer: Self-pay | Admitting: Internal Medicine

## 2013-09-19 MED ORDER — HYDROCODONE-ACETAMINOPHEN 10-325 MG PO TABS: 1.0000 | ORAL_TABLET | Freq: Every day | ORAL | Status: DC | PRN

## 2013-09-23 ENCOUNTER — Other Ambulatory Visit: Payer: Self-pay | Admitting: *Deleted

## 2013-09-23 ENCOUNTER — Ambulatory Visit (HOSPITAL_COMMUNITY): Payer: 59

## 2013-09-23 MED ORDER — BUPROPION HCL ER (XL) 300 MG PO TB24: 300.0000 mg | ORAL_TABLET | Freq: Every day | ORAL | Status: DC

## 2013-09-29 ENCOUNTER — Ambulatory Visit (INDEPENDENT_AMBULATORY_CARE_PROVIDER_SITE_OTHER): Payer: 59 | Admitting: Adult Health

## 2013-09-29 ENCOUNTER — Encounter: Payer: Self-pay | Admitting: Adult Health

## 2013-09-29 VITALS — BP 144/86 | HR 74 | Temp 97.9°F | Resp 14 | Ht 66.0 in | Wt 128.0 lb

## 2013-09-29 DIAGNOSIS — J069 Acute upper respiratory infection, unspecified: Secondary | ICD-10-CM

## 2013-09-29 LAB — POC INFLUENZA A&B (BINAX/QUICKVUE)
Influenza A, POC: NEGATIVE
Influenza B, POC: NEGATIVE

## 2013-09-29 MED ORDER — AMOXICILLIN-POT CLAVULANATE 875-125 MG PO TABS: 1.0000 | ORAL_TABLET | Freq: Two times a day (BID) | ORAL | Status: DC

## 2013-09-29 NOTE — Patient Instructions (Signed)
  Start Augmentin twice a day for 7 days.  Drink plenty of fluids.  Tylenol for general discomfort.  You can continue Afrin but use for 3 days only.

## 2013-09-29 NOTE — Progress Notes (Signed)
Patient ID: GENIA PERIN, female   DOB: 12/30/1955, 58 y.o.   MRN: [id number]   Subjective:    Patient ID: GLAYDS INSCO, female    DOB: Nov 27, 1955, 59 y.o.   MRN: [id number]  HPI Pt is a 58 y/o female who presents to clinic with sinus congestion, right ear pain, chills. She reports worsening symptoms on Saturday. She reports ongoing chills and right ear discomfort for approximately 2 weeks. She states that these symptoms would come and go. No fever. She does feel warm to touch, however. Has been taking tylenol. Pt reports that she feels she has the flu.   Past Medical History  Diagnosis Date  . Hypertension   . Depression   . Degenerative joint disease involving multiple joints   . Spinal stenosis of lumbar region   . Anxiety   . Dizziness   . Headache(784.0)     having since  past month and a half  . Pneumonia     approx 6 years ago  . Ulcerative proctitis   . 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 ALPRAZolam (XANAX) 0.5 MG tablet TAKE 1 TABLET BY MOUTH AT BEDTIME AS NEEDED  30 tablet  2  . amLODipine (NORVASC) 10 MG tablet Take 1 tablet (10 mg total) by mouth daily.  90 tablet  3  . buPROPion (WELLBUTRIN XL) 300 MG 24 hr tablet Take 1 tablet (300 mg total) by mouth daily.  30 tablet  2  . etodolac (LODINE XL) 600 MG 24 hr tablet Take 600 mg by mouth daily.      . fluticasone (FLONASE) 50 MCG/ACT nasal spray Place 2 sprays into both nostrils daily.       Marland Kitchen HYDROcodone-acetaminophen (NORCO) 10-325 MG per tablet Take 1 tablet by mouth daily as needed for moderate pain.  30 tablet  0  . Linaclotide (LINZESS) 290 MCG CAPS capsule Take 1 capsule (290 mcg total) by mouth daily.  30 capsule  11  . lisinopril (PRINIVIL,ZESTRIL) 5 MG tablet Take 1 tablet (5 mg total) by mouth daily.  90 tablet  3  . methocarbamol (ROBAXIN) 500 MG tablet  Take 1 tablet (500 mg total) by mouth 3 (three) times daily as needed for muscle spasms.  60 tablet  1  . Tetrahydrozoline HCl (VISINE OP) Place 2 drops into both eyes daily as needed (dry eyes).      Marland Kitchen zolpidem (AMBIEN) 10 MG tablet Take 10 mg by mouth at bedtime as needed for sleep.      . [DISCONTINUED] ferrous sulfate 325 (65 FE) MG EC tablet Take 1 tablet (325 mg total) by mouth daily after breakfast.  30 tablet  3  . [DISCONTINUED] metFORMIN (GLUCOPHAGE) 500 MG tablet Take 500 mg by mouth 2 (two) times daily with a meal.         No current facility-administered medications on file prior to visit.     Review of Systems  Constitutional: Negative for fever and chills.  HENT: Positive for congestion, postnasal drip, rhinorrhea, sinus pressure and sneezing. Negative for sore throat.   Respiratory: Positive for cough.   All other systems reviewed and are negative.      Objective:  BP 144/86  Pulse 74  Temp(Src) 97.9 F (36.6 C) (Oral)  Resp 14  Ht 5\' 6"  (1.676 m)  Wt 128 lb (  58.06 kg)  BMI 20.67 kg/m2  SpO2 99%   Physical Exam  Constitutional: She is oriented to person, place, and time. She appears well-developed and well-nourished. No distress.  HENT:  Head: Normocephalic and atraumatic.  Right Ear: External ear normal.  Left Ear: External ear normal.  Mouth/Throat: No oropharyngeal exudate.  Painful maxillary sinuses with palpation. Right ear canal is slightly erythematous. No s/s infection  Eyes: Conjunctivae and EOM are normal.  Cardiovascular: Normal rate, regular rhythm and normal heart sounds.  Exam reveals no gallop.   No murmur heard. Pulmonary/Chest: Effort normal and breath sounds normal. No respiratory distress. She has no wheezes. She has no rales.  Lymphadenopathy:    She has cervical adenopathy.  Neurological: She is alert and oriented to person, place, and time.  Psychiatric: She has a normal mood and affect. Her behavior is normal. Judgment and thought  content normal.      Assessment & Plan:   1. Acute upper respiratory infections of unspecified site Suspect sinus infection. Negative for flu. Start Augmentin bid x 7 days. Drink plenty of fluids. Tylenol for general discomfort.

## 2013-09-29 NOTE — Progress Notes (Signed)
Pre visit review using our clinic review tool, if applicable. No additional management support is needed unless otherwise documented below in the visit note. 

## 2013-10-01 ENCOUNTER — Encounter: Payer: Self-pay | Admitting: Internal Medicine

## 2013-10-01 MED ORDER — HYDROCODONE-ACETAMINOPHEN 7.5-500 MG PO TABS: 1.0000 | ORAL_TABLET | Freq: Every day | ORAL | Status: DC | PRN

## 2013-10-01 NOTE — Telephone Encounter (Signed)
For pickup on Friday

## 2013-10-03 ENCOUNTER — Telehealth: Payer: Self-pay | Admitting: Internal Medicine

## 2013-10-07 NOTE — Telephone Encounter (Signed)
Medication refill sent as requested.

## 2013-10-09 ENCOUNTER — Encounter: Payer: Self-pay | Admitting: Internal Medicine

## 2013-10-11 MED ORDER — HYDROCODONE-ACETAMINOPHEN 7.5-500 MG PO TABS: 1.0000 | ORAL_TABLET | Freq: Two times a day (BID) | ORAL | Status: DC | PRN

## 2013-10-13 ENCOUNTER — Encounter: Payer: Self-pay | Admitting: Internal Medicine

## 2013-10-13 ENCOUNTER — Telehealth: Payer: Self-pay | Admitting: Internal Medicine

## 2013-10-13 MED ORDER — HYDROCODONE-ACETAMINOPHEN 7.5-325 MG PO TABS: 1.0000 | ORAL_TABLET | Freq: Two times a day (BID) | ORAL | Status: DC | PRN

## 2013-10-13 NOTE — Telephone Encounter (Signed)
Pt came in and stated needing an appt to start up an Fibromyalgia treatment. Wanting something soon. Please advise.

## 2013-10-13 NOTE — Telephone Encounter (Signed)
Script reprinted for 7.5/ 325 Vicodin.

## 2013-10-14 ENCOUNTER — Other Ambulatory Visit: Payer: Self-pay | Admitting: *Deleted

## 2013-10-14 MED ORDER — PREGABALIN 50 MG PO CAPS: 50.0000 mg | ORAL_CAPSULE | Freq: Three times a day (TID) | ORAL | Status: DC

## 2013-10-14 MED ORDER — METHOCARBAMOL 500 MG PO TABS: 500.0000 mg | ORAL_TABLET | Freq: Three times a day (TID) | ORAL | Status: DC | PRN

## 2013-10-14 NOTE — Telephone Encounter (Signed)
Called Rx to pharmacy

## 2013-10-14 NOTE — Telephone Encounter (Signed)
Would 10/29/13 at 11.30 work for this patient?

## 2013-10-14 NOTE — Telephone Encounter (Signed)
Yes needs 30 minutes

## 2013-10-14 NOTE — Telephone Encounter (Signed)
   [facility]. Starting dose is 50 mg three times daily   Dr Derrel Nip

## 2013-10-15 NOTE — Telephone Encounter (Signed)
Spot is already taken. Please advise.

## 2013-10-15 NOTE — Telephone Encounter (Signed)
10/29/13 at 11.30 Please schedule if possible

## 2013-10-15 NOTE — Telephone Encounter (Signed)
Next available 30min 

## 2013-10-22 ENCOUNTER — Encounter: Payer: Self-pay | Admitting: Internal Medicine

## 2013-10-28 MED ORDER — HYDROCODONE-ACETAMINOPHEN 7.5-325 MG PO TABS: 1.0000 | ORAL_TABLET | Freq: Three times a day (TID) | ORAL | Status: DC | PRN

## 2013-10-28 NOTE — Telephone Encounter (Signed)
New hydrocodone rx for #90  Can refill on Wednesday sept 10

## 2013-10-30 ENCOUNTER — Telehealth: Payer: Self-pay | Admitting: *Deleted

## 2013-10-30 MED ORDER — HYDROCODONE-ACETAMINOPHEN 7.5-325 MG PO TABS: 1.0000 | ORAL_TABLET | Freq: Four times a day (QID) | ORAL | Status: DC | PRN

## 2013-10-30 NOTE — Telephone Encounter (Signed)
Left message, notifying patient Rx ready for pickup

## 2013-10-30 NOTE — Telephone Encounter (Signed)
rx rewritten for 4 times daily so patient can fill now

## 2013-10-30 NOTE — Telephone Encounter (Signed)
Spoke with pt, states [facility] could not fill [facility]codone Rx until 11/03/13 due to way previous Rx was written. Pt states she is out. [facility] with [facility] at [facility]. Last Rx was written bid #60, filled 10/13/13. Even if she has been taking tid, she would still not be due for refill until 11/03/13. Pt is requesting another Rx be written for 4 times daily so that she can get filled now

## 2013-11-01 ENCOUNTER — Other Ambulatory Visit: Payer: Self-pay | Admitting: Internal Medicine

## 2013-11-03 NOTE — Telephone Encounter (Signed)
Ok refill? 

## 2013-11-03 NOTE — Telephone Encounter (Signed)
Ok to refill,  printed rx  

## 2013-11-20 ENCOUNTER — Encounter: Payer: Self-pay | Admitting: Internal Medicine

## 2013-11-24 ENCOUNTER — Ambulatory Visit: Payer: 59 | Admitting: Internal Medicine

## 2013-11-25 ENCOUNTER — Encounter: Payer: Self-pay | Admitting: Internal Medicine

## 2013-11-26 ENCOUNTER — Other Ambulatory Visit: Payer: Self-pay | Admitting: Internal Medicine

## 2013-11-26 MED ORDER — HYDROCODONE-ACETAMINOPHEN 7.5-325 MG PO TABS: 1.0000 | ORAL_TABLET | Freq: Three times a day (TID) | ORAL | Status: DC | PRN

## 2013-12-01 ENCOUNTER — Ambulatory Visit: Payer: 59 | Admitting: Internal Medicine

## 2013-12-05 ENCOUNTER — Other Ambulatory Visit: Payer: Self-pay

## 2013-12-15 ENCOUNTER — Other Ambulatory Visit: Payer: Self-pay | Admitting: Internal Medicine

## 2013-12-23 ENCOUNTER — Encounter: Payer: Self-pay | Admitting: Internal Medicine

## 2013-12-24 ENCOUNTER — Other Ambulatory Visit: Payer: Self-pay | Admitting: Internal Medicine

## 2013-12-24 DIAGNOSIS — E785 Hyperlipidemia, unspecified: Secondary | ICD-10-CM

## 2013-12-24 DIAGNOSIS — Z1159 Encounter for screening for other viral diseases: Secondary | ICD-10-CM

## 2013-12-24 DIAGNOSIS — D509 Iron deficiency anemia, unspecified: Secondary | ICD-10-CM

## 2013-12-24 DIAGNOSIS — Z79899 Other long term (current) drug therapy: Secondary | ICD-10-CM

## 2013-12-24 DIAGNOSIS — R5383 Other fatigue: Secondary | ICD-10-CM

## 2013-12-25 ENCOUNTER — Other Ambulatory Visit: Payer: Self-pay | Admitting: *Deleted

## 2013-12-25 ENCOUNTER — Other Ambulatory Visit (INDEPENDENT_AMBULATORY_CARE_PROVIDER_SITE_OTHER): Payer: 59

## 2013-12-25 DIAGNOSIS — Z79899 Other long term (current) drug therapy: Secondary | ICD-10-CM

## 2013-12-25 DIAGNOSIS — R5383 Other fatigue: Secondary | ICD-10-CM

## 2013-12-25 DIAGNOSIS — Z1159 Encounter for screening for other viral diseases: Secondary | ICD-10-CM

## 2013-12-25 DIAGNOSIS — E785 Hyperlipidemia, unspecified: Secondary | ICD-10-CM

## 2013-12-25 DIAGNOSIS — D509 Iron deficiency anemia, unspecified: Secondary | ICD-10-CM

## 2013-12-25 LAB — COMPREHENSIVE METABOLIC PANEL
ALT: 24 U/L (ref 0–35)
AST: 20 U/L (ref 0–37)
Albumin: 4 g/dL (ref 3.5–5.2)
Alkaline Phosphatase: 60 U/L (ref 39–117)
BUN: 21 mg/dL (ref 6–23)
CO2: 26 mEq/L (ref 19–32)
Calcium: 9.8 mg/dL (ref 8.4–10.5)
Chloride: 105 mEq/L (ref 96–112)
Creatinine, Ser: 0.8 mg/dL (ref 0.4–1.2)
GFR: 74.02 mL/min (ref >60.00)
Glucose, Bld: 83 mg/dL (ref 70–99)
Potassium: 4.9 mEq/L (ref 3.5–5.1)
Sodium: 138 mEq/L (ref 135–145)
Total Bilirubin: 0.5 mg/dL (ref 0.2–1.2)
Total Protein: 7.1 g/dL (ref 6.0–8.3)

## 2013-12-25 LAB — CBC WITH DIFFERENTIAL/PLATELET
Basophils Absolute: 0 10*3/uL (ref 0.0–0.1)
Basophils Relative: 0.7 % (ref 0.0–3.0)
Eosinophils Absolute: 0.1 10*3/uL (ref 0.0–0.7)
Eosinophils Relative: 3 % (ref 0.0–5.0)
HCT: 35.2 % — ABNORMAL LOW (ref 36.0–46.0)
Hemoglobin: 11.8 g/dL — ABNORMAL LOW (ref 12.0–15.0)
Lymphocytes Relative: 33 % (ref 12.0–46.0)
Lymphs Abs: 1.5 10*3/uL (ref 0.7–4.0)
MCHC: 33.5 g/dL (ref 30.0–36.0)
MCV: 93.8 fl (ref 78.0–100.0)
Monocytes Absolute: 0.5 10*3/uL (ref 0.1–1.0)
Monocytes Relative: 10.8 % (ref 3.0–12.0)
Neutro Abs: 2.4 10*3/uL (ref 1.4–7.7)
Neutrophils Relative %: 52.5 % (ref 43.0–77.0)
Platelets: 367 10*3/uL (ref 150.0–400.0)
RBC: 3.75 Mil/uL — ABNORMAL LOW (ref 3.87–5.11)
RDW: 13.6 % (ref 11.5–15.5)
WBC: 4.7 10*3/uL (ref 4.0–10.5)

## 2013-12-25 LAB — LIPID PANEL
Cholesterol: 222 mg/dL — ABNORMAL HIGH (ref 0–200)
HDL: 58 mg/dL (ref >39.00)
LDL Cholesterol: 148 mg/dL — ABNORMAL HIGH (ref 0–99)
NonHDL: 164
Total CHOL/HDL Ratio: 4
Triglycerides: 82 mg/dL (ref 0.0–149.0)
VLDL: 16.4 mg/dL (ref 0.0–40.0)

## 2013-12-25 LAB — TSH: TSH: 0.89 u[IU]/mL (ref 0.35–4.50)

## 2013-12-25 MED ORDER — HYDROCODONE-ACETAMINOPHEN 7.5-325 MG PO TABS: 1.0000 | ORAL_TABLET | Freq: Three times a day (TID) | ORAL | Status: DC | PRN

## 2013-12-25 NOTE — Progress Notes (Signed)
Printed script for refill on Hydrocodone approved by Dr Derrel Nip

## 2013-12-26 ENCOUNTER — Other Ambulatory Visit: Payer: 59

## 2013-12-26 LAB — HEPATITIS C ANTIBODY: HCV Ab: NEGATIVE

## 2013-12-28 ENCOUNTER — Encounter: Payer: Self-pay | Admitting: Internal Medicine

## 2014-01-01 ENCOUNTER — Encounter: Payer: Self-pay | Admitting: Internal Medicine

## 2014-01-01 ENCOUNTER — Ambulatory Visit (INDEPENDENT_AMBULATORY_CARE_PROVIDER_SITE_OTHER): Payer: 59 | Admitting: Internal Medicine

## 2014-01-01 VITALS — BP 118/68 | HR 78 | Temp 98.2°F | Resp 16 | Ht 66.0 in | Wt 126.5 lb

## 2014-01-01 DIAGNOSIS — Q283 Other malformations of cerebral vessels: Secondary | ICD-10-CM

## 2014-01-01 DIAGNOSIS — M19012 Primary osteoarthritis, left shoulder: Secondary | ICD-10-CM

## 2014-01-01 DIAGNOSIS — Z23 Encounter for immunization: Secondary | ICD-10-CM

## 2014-01-01 DIAGNOSIS — Q282 Arteriovenous malformation of cerebral vessels: Secondary | ICD-10-CM

## 2014-01-01 DIAGNOSIS — M48061 Spinal stenosis, lumbar region without neurogenic claudication: Secondary | ICD-10-CM

## 2014-01-01 DIAGNOSIS — M4806 Spinal stenosis, lumbar region: Secondary | ICD-10-CM

## 2014-01-01 DIAGNOSIS — I10 Essential (primary) hypertension: Secondary | ICD-10-CM

## 2014-01-01 DIAGNOSIS — M19011 Primary osteoarthritis, right shoulder: Secondary | ICD-10-CM

## 2014-01-01 DIAGNOSIS — M797 Fibromyalgia: Secondary | ICD-10-CM

## 2014-01-01 DIAGNOSIS — Z1239 Encounter for other screening for malignant neoplasm of breast: Secondary | ICD-10-CM

## 2014-01-01 MED ORDER — METHOCARBAMOL 500 MG PO TABS: 500.0000 mg | ORAL_TABLET | Freq: Three times a day (TID) | ORAL | Status: DC | PRN

## 2014-01-01 MED ORDER — HYDROCODONE-ACETAMINOPHEN 7.5-325 MG PO TABS: 1.0000 | ORAL_TABLET | Freq: Four times a day (QID) | ORAL | Status: DC | PRN

## 2014-01-01 MED ORDER — BUPROPION HCL ER (XL) 150 MG PO TB24: 150.0000 mg | ORAL_TABLET | Freq: Every day | ORAL | Status: DC

## 2014-01-01 MED ORDER — LINACLOTIDE 290 MCG PO CAPS: 290.0000 ug | ORAL_CAPSULE | Freq: Every day | ORAL | Status: DC

## 2014-01-01 MED ORDER — MILNACIPRAN HCL 12.5 & 25 & 50 MG PO MISC: ORAL | Status: DC

## 2014-01-01 MED ORDER — HYDROCODONE-ACETAMINOPHEN 7.5-325 MG PO TABS: 1.0000 | ORAL_TABLET | Freq: Three times a day (TID) | ORAL | Status: DC | PRN

## 2014-01-01 NOTE — Patient Instructions (Signed)
We are going to try [facility] for the fibromyalgia . The dose is titrated up to 50 mg twice daily over the first week So continue 300 mg Wellbutrin daily for the first week  Once you get to 50 mg twice daily on the  Savella,  Reduce the Wellbutrin to 150 mg dairy

## 2014-01-01 NOTE — Progress Notes (Signed)
Patient ID: Adrienne Robinson, female   DOB: [birth year], 58 y.o.   MRN: [id number] Patient Active Problem List   Diagnosis Date Noted  . Fibromyalgia 01/03/2014  . Generalized anxiety disorder 08/19/2013  . Osteoarthritis of shoulder 06/27/2013  . Polyarthritis of multiple sites 04/13/2013  . Rash and nonspecific skin eruption 04/13/2013  . S/P abdominal hysterectomy 03/04/2013  . Chronic reflux esophagitis 10/29/2012  . Leukopenia 03/05/2012  . AVM (arteriovenous malformation) brain 01/31/2012  . Need for influenza vaccination 01/28/2012  . Ulcerative colitis with rectal bleeding 10/05/2011  . Family history of colon cancer 10/04/2011  . Hypertension   . Degenerative joint disease involving multiple joints   . Spinal stenosis of lumbar region     Subjective:  CC:   Chief Complaint  Patient presents with  . Follow-up    Flu shot just normal follow up in general.    HPI:   Adrienne Robinson is a 58 y.o. female who presents for   foillow up on chronic issues incouding shoujlder and pack pain,  Due to DJD   Was dxd with FM after right shouldewr replacement 6 months ago   Back pain aggravated by traditional stting position ,  Has been adjusting her chair high and desk height  but has to get up frequently to change position .  Using 3 to 4 vicodin daily    Past Medical History  Diagnosis Date  . Hypertension   . Depression   . Degenerative joint disease involving multiple joints   . Spinal stenosis of lumbar region   . Anxiety   . Dizziness   . Headache(784.0)     having since  past month and a half  . Pneumonia     approx 6 years ago  . Ulcerative proctitis   . 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 Abdominal hysterectomy    . Oophorectomy    . Shoulder arthroscopy with rotator cuff repair and subacromial decompression  2007    left  . Nasal sinus surgery     . Appendectomy    . Cardiac catheterization      normal coronaries, no wall motion abnormalities 11/02/09 [facility])  . Radiology with anesthesia  02/26/2012    Procedure: RADIOLOGY WITH ANESTHESIA;  Surgeon: [name], MD;  Location: [place];  Service: Radiology;  Laterality: N/A;  . Av fistula repair    . Tonsillectomy  adnoids  . Reverse shoulder arthroplasty Right 06/27/2013    Procedure: REVERSE RIGHT TOTAL SHOULDER ARTHROPLASTY;  Surgeon: [name], MD;  Location: [place];  Service: Orthopedics;  Laterality: Right;       The following portions of the patient's history were reviewed and updated as appropriate: Allergies, current medications, and problem list.    Review of Systems:   Patient denies headache, fevers, malaise, unintentional weight loss, skin rash, eye pain, sinus congestion and sinus pain, sore throat, dysphagia,  hemoptysis , cough, dyspnea, wheezing, chest pain, palpitations, orthopnea, edema, abdominal pain, nausea, melena, diarrhea, constipation, flank pain, dysuria, hematuria, urinary  Frequency, nocturia, numbness, tingling, seizures,  Focal weakness, Loss of consciousness,  Tremor, insomnia, depression, anxiety, and suicidal ideation.     History   Social History  . Marital Status: Married    Spouse Name: N/A    Number of Children: N/A  . Years of Education: N/A   Occupational History  .  Not on file.   Social History Main Topics  . Smoking status: Never Smoker   . Smokeless tobacco: Never Used  . Alcohol Use: 1.2 oz/week    2 Glasses of wine per week     Comment: 2-3 week with dinner  . Drug Use: No  . Sexual Activity: Not on file   Other Topics Concern  . Not on file   Social History Narrative    Objective:  Filed Vitals:   01/01/14 1008  BP: 118/68  Pulse: 78  Temp: 98.2 F (36.8 C)  Resp: 16     General appearance: alert, cooperative and appears stated age Ears: normal TM's and external ear canals both ears Throat: lips,  mucosa, and tongue normal; teeth and gums normal Neck: no adenopathy, no carotid bruit, supple, symmetrical, trachea midline and thyroid not enlarged, symmetric, no tenderness/mass/nodules Back: symmetric, no curvature. ROM normal. No CVA tenderness. Lungs: clear to auscultation bilaterally Heart: regular rate and rhythm, S1, S2 normal, no murmur, click, rub or gallop Abdomen: soft, non-tender; bowel sounds normal; no masses,  no organomegaly Pulses: 2+ and symmetric Skin: Skin color, texture, turgor normal. No rashes or lesions Lymph nodes: Cervical, supraclavicular, and axillary nodes normal.  Assessment and Plan:  Osteoarthritis of shoulder S/p surgery,  With persistent pain and reduced ROM  Spinal stenosis of lumbar region Managed with vicodin.  Refills given   Fibromyalgia Diagnosed with Dr Estanislado Pandy.  Long discussion today regarding patient's   multiple pain syndromes due to DJD shoulder and spinal stenosis.  Trial of Savella,   Hypertension Well controlled on current regimen. Renal function stable, no changes today.  Lab Results  Component Value Date   CREATININE 0.8 12/25/2013  \ Lab Results  Component Value Date   NA 138 12/25/2013   K 4.9 12/25/2013   CL 105 12/25/2013   CO2 26 12/25/2013     AVM (arteriovenous malformation) brain Stable by repeat evaluation   A total of 40 minutes was spent with patient more than half of which was spent in counseling patient on the above mentioned issues , reviewing and explaining recent labs and imaging studies done, and coordination of care.   Updated Medication List Outpatient Encounter Prescriptions as of 01/01/2014  Medication Sig  . acetaminophen (TYLENOL) 500 MG tablet Take 500 mg by mouth every 6 (six) hours as needed.  . ALPRAZolam (XANAX) 0.5 MG tablet TAKE 1 TABLET BY MOUTH AT BEDTIME AS NEEDED  . amLODipine (NORVASC) 10 MG tablet Take 1 tablet (10 mg total) by mouth daily.  . fluticasone (FLONASE) 50 MCG/ACT  nasal spray Place 2 sprays into both nostrils daily.   Marland Kitchen HYDROcodone-acetaminophen (NORCO) 7.5-325 MG per tablet Take 1 tablet by mouth every 6 (six) hours as needed for severe pain.  . Linaclotide (LINZESS) 290 MCG CAPS capsule Take 1 capsule (290 mcg total) by mouth daily.  Marland Kitchen lisinopril (PRINIVIL,ZESTRIL) 5 MG tablet Take 1 tablet (5 mg total) by mouth daily.  . methocarbamol (ROBAXIN) 500 MG tablet Take 1 tablet (500 mg total) by mouth 3 (three) times daily as needed for muscle spasms.  . Tetrahydrozoline HCl (VISINE OP) Place 2 drops into both eyes daily as needed (dry eyes).  Marland Kitchen zolpidem (AMBIEN) 10 MG tablet TAKE 1 TABLET BY MOUTH AS NEEDED  . [DISCONTINUED] buPROPion (WELLBUTRIN XL) 300 MG 24 hr tablet TAKE 1 TABLET (300 MG TOTAL) BY MOUTH DAILY.  . [DISCONTINUED] HYDROcodone-acetaminophen (NORCO) 7.5-325 MG per tablet Take 1 tablet by mouth 3 (three)  times daily as needed for severe pain.  . [DISCONTINUED] HYDROcodone-acetaminophen (NORCO) 7.5-325 MG per tablet Take 1 tablet by mouth 3 (three) times daily as needed for severe pain.  . [DISCONTINUED] Linaclotide (LINZESS) 290 MCG CAPS capsule Take 1 capsule (290 mcg total) by mouth daily.  . [DISCONTINUED] methocarbamol (ROBAXIN) 500 MG tablet Take 1 tablet (500 mg total) by mouth 3 (three) times daily as needed for muscle spasms.  . [DISCONTINUED] methocarbamol (ROBAXIN) 500 MG tablet Take 1 tablet (500 mg total) by mouth 3 (three) times daily as needed for muscle spasms.  Marland Kitchen buPROPion (WELLBUTRIN XL) 150 MG 24 hr tablet Take 1 tablet (150 mg total) by mouth daily.  Marland Kitchen etodolac (LODINE XL) 600 MG 24 hr tablet Take 600 mg by mouth daily.  . Milnacipran HCl (SAVELLA TITRATION PACK) 12.5 & 25 & 50 MG MISC 12.5 mg daily x 1,  Then twice daily days 2 and 3,  Then 25 mg twice daily days  4 to 7,  Then 50 mg twice daily thereafter  . pregabalin (LYRICA) 50 MG capsule Take 1 capsule (50 mg total) by mouth 3 (three) times daily.  . [DISCONTINUED]  amoxicillin-clavulanate (AUGMENTIN) 875-125 MG per tablet Take 1 tablet by mouth 2 (two) times daily.     Orders Placed This Encounter  Procedures  . MM DIGITAL SCREENING BILATERAL  . Flu Vaccine QUAD 36+ mos IM    No Follow-up on file.

## 2014-01-03 DIAGNOSIS — M797 Fibromyalgia: Secondary | ICD-10-CM | POA: Insufficient documentation

## 2014-01-03 NOTE — Assessment & Plan Note (Signed)
Managed with vicodin.  Refills given

## 2014-01-03 NOTE — Assessment & Plan Note (Signed)
S/p surgery,  With persistent pain and reduced ROM

## 2014-01-04 NOTE — Assessment & Plan Note (Signed)
Well controlled on current regimen. Renal function stable, no changes today.  Lab Results  Component Value Date   CREATININE 0.8 12/25/2013  \ Lab Results  Component Value Date   NA 138 12/25/2013   K 4.9 12/25/2013   CL 105 12/25/2013   CO2 26 12/25/2013

## 2014-01-04 NOTE — Assessment & Plan Note (Signed)
Diagnosed with Dr Estanislado Pandy.  Long discussion today regarding patient's   multiple pain syndromes due to DJD shoulder and spinal stenosis.  Trial of Savella,

## 2014-01-04 NOTE — Assessment & Plan Note (Signed)
Stable by repeat evaluation

## 2014-01-09 ENCOUNTER — Encounter: Payer: Self-pay | Admitting: Internal Medicine

## 2014-01-09 ENCOUNTER — Other Ambulatory Visit: Payer: Self-pay | Admitting: Internal Medicine

## 2014-01-09 DIAGNOSIS — M130 Polyarthritis, unspecified: Secondary | ICD-10-CM

## 2014-01-13 NOTE — Telephone Encounter (Signed)
Please advise 

## 2014-01-19 ENCOUNTER — Encounter: Payer: Self-pay | Admitting: Internal Medicine

## 2014-01-19 MED ORDER — TRAMADOL HCL 50 MG PO TABS: 50.0000 mg | ORAL_TABLET | Freq: Two times a day (BID) | ORAL | Status: DC

## 2014-01-19 NOTE — Telephone Encounter (Signed)
Please advise 

## 2014-01-19 NOTE — Telephone Encounter (Signed)
[facility]. Patient has run out of her vicodin a week early and is leaving town [employer].

## 2014-01-20 NOTE — Telephone Encounter (Signed)
Called to pharmacy 

## 2014-01-29 ENCOUNTER — Other Ambulatory Visit: Payer: Self-pay | Admitting: Orthopedic Surgery

## 2014-01-29 DIAGNOSIS — Z96611 Presence of right artificial shoulder joint: Secondary | ICD-10-CM

## 2014-02-02 ENCOUNTER — Ambulatory Visit
Admission: RE | Admit: 2014-02-02 | Discharge: 2014-02-02 | Disposition: A | Discharge status: Home / Self Care | Payer: 59 | Source: Ambulatory Visit | Attending: Orthopedic Surgery | Admitting: Orthopedic Surgery

## 2014-02-02 DIAGNOSIS — Z96611 Presence of right artificial shoulder joint: Secondary | ICD-10-CM

## 2014-02-24 ENCOUNTER — Encounter: Payer: Self-pay | Admitting: Internal Medicine

## 2014-02-26 ENCOUNTER — Ambulatory Visit: Payer: 59

## 2014-03-02 ENCOUNTER — Encounter: Payer: Self-pay | Admitting: Internal Medicine

## 2014-03-02 MED ORDER — HYDROCODONE-ACETAMINOPHEN 7.5-325 MG PO TABS: 1.0000 | ORAL_TABLET | Freq: Four times a day (QID) | ORAL | Status: DC | PRN

## 2014-03-02 NOTE — Telephone Encounter (Signed)
Rx placed up front for pick up. 

## 2014-03-02 NOTE — Telephone Encounter (Signed)
Please advise on Dr. Lupita Dawn absence

## 2014-03-11 ENCOUNTER — Encounter: Payer: Self-pay | Admitting: Internal Medicine

## 2014-03-12 ENCOUNTER — Other Ambulatory Visit: Payer: Self-pay | Admitting: *Deleted

## 2014-03-12 MED ORDER — TRAMADOL HCL 50 MG PO TABS: 50.0000 mg | ORAL_TABLET | Freq: Two times a day (BID) | ORAL | Status: DC

## 2014-03-12 NOTE — Telephone Encounter (Signed)
Last visit 01/01/14, ok refill for Dr. Derrel Nip?

## 2014-03-12 NOTE — Telephone Encounter (Signed)
Called to pharmacy 

## 2014-03-20 ENCOUNTER — Ambulatory Visit
Admission: RE | Admit: 2014-03-20 | Discharge: 2014-03-20 | Disposition: A | Discharge status: Home / Self Care | Payer: 59 | Source: Ambulatory Visit | Attending: Internal Medicine | Admitting: Internal Medicine

## 2014-03-20 DIAGNOSIS — Z1239 Encounter for other screening for malignant neoplasm of breast: Secondary | ICD-10-CM

## 2014-04-01 ENCOUNTER — Encounter: Payer: Self-pay | Admitting: Internal Medicine

## 2014-04-03 ENCOUNTER — Other Ambulatory Visit: Payer: Self-pay | Admitting: *Deleted

## 2014-04-03 MED ORDER — HYDROCODONE-ACETAMINOPHEN 7.5-325 MG PO TABS: 1.0000 | ORAL_TABLET | Freq: Four times a day (QID) | ORAL | Status: DC | PRN

## 2014-04-03 NOTE — Progress Notes (Signed)
Placed script at front desk and notified patient ready for pickup.

## 2014-04-05 ENCOUNTER — Other Ambulatory Visit: Payer: Self-pay | Admitting: Internal Medicine

## 2014-04-06 NOTE — Telephone Encounter (Signed)
Ok to fill 

## 2014-04-06 NOTE — Telephone Encounter (Signed)
Ok to refill,  Refill sent  

## 2014-04-22 ENCOUNTER — Encounter: Payer: Self-pay | Admitting: Internal Medicine

## 2014-04-28 ENCOUNTER — Other Ambulatory Visit: Payer: Self-pay | Admitting: Internal Medicine

## 2014-04-29 ENCOUNTER — Encounter: Payer: Self-pay | Admitting: Internal Medicine

## 2014-04-30 ENCOUNTER — Encounter: Payer: Self-pay | Admitting: Internal Medicine

## 2014-04-30 MED ORDER — HYDROCODONE-ACETAMINOPHEN 7.5-325 MG PO TABS: 1.0000 | ORAL_TABLET | Freq: Four times a day (QID) | ORAL | Status: DC | PRN

## 2014-04-30 NOTE — Telephone Encounter (Signed)
Last visit 01/01/14, ok refill? See mychart msg

## 2014-04-30 NOTE — Telephone Encounter (Signed)
Ok to refill,  printed rx  

## 2014-05-19 ENCOUNTER — Other Ambulatory Visit: Payer: Self-pay | Admitting: Internal Medicine

## 2014-05-20 NOTE — Telephone Encounter (Signed)
rx faxed

## 2014-05-20 NOTE — Telephone Encounter (Signed)
Last OV 11.12.15, last refill 2.26.16.  Please advise refill

## 2014-05-20 NOTE — Telephone Encounter (Signed)
Ok to refill,  printed rx  

## 2014-05-24 IMAGING — CT CT HEAD WITHOUT CONTRAST
1 series · 16 of 30 positions shown, 20 images · non-contrast
Comparison: none

REASON FOR EXAM: dizziness, numbness in arms
COMMENTS:

PROCEDURE:     CT  - CT HEAD WITHOUT CONTRAST  - September 14, 2011  [DATE]
RESULT:     Technique: Helical 5mm sections were obtained from the skull
base to the vertex without administration of intravenous contrast.

[Series 2: soft tissue · axial · 0.41mm/px · z∈[+9,+144]mm · 16 of 30 slices shown, 20 images]
[im 2/30  brain]
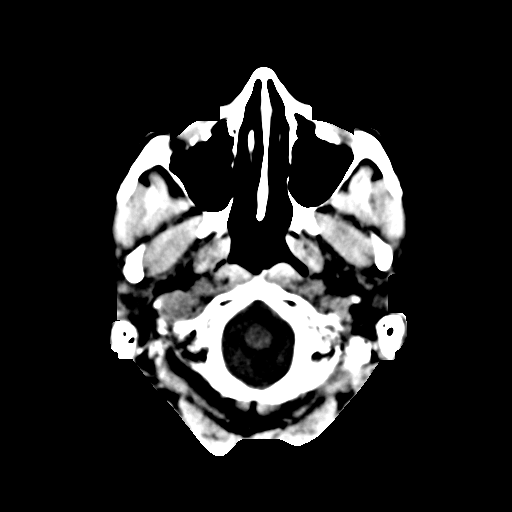
[im 2/30  bone]
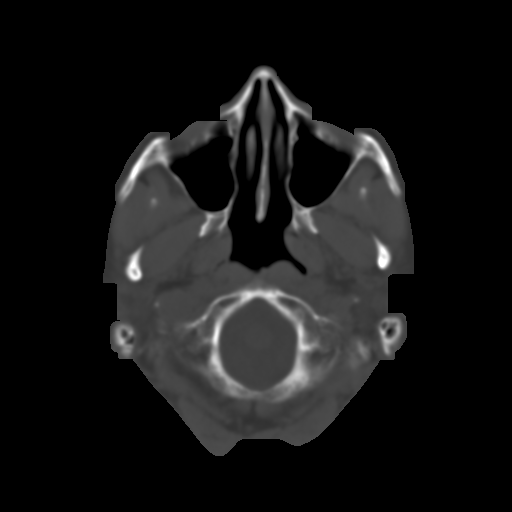
[im 4/30  brain]
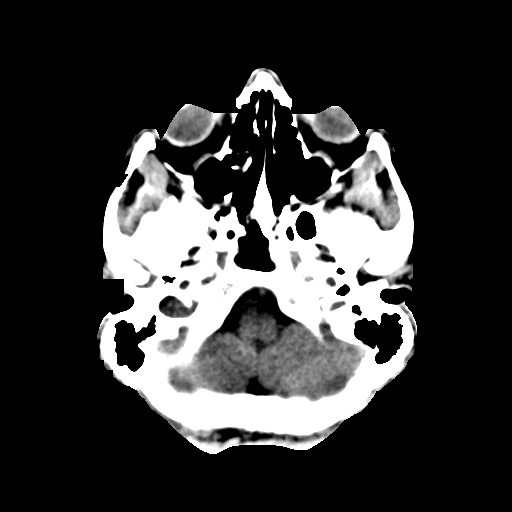
[im 6/30  brain]
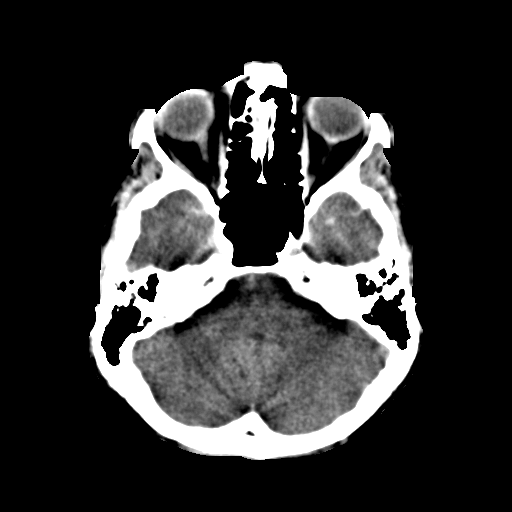
[im 8/30  brain]
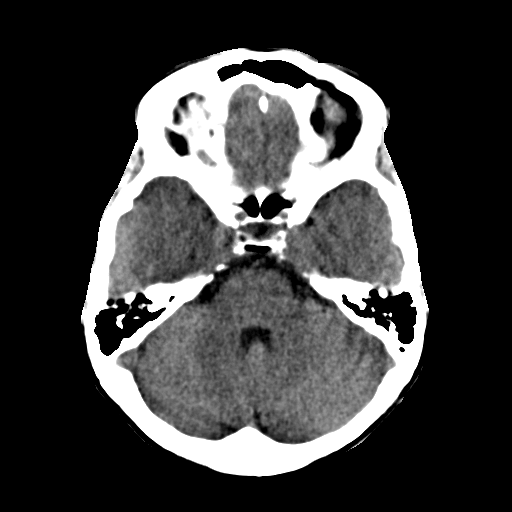
[im 9/30  brain]
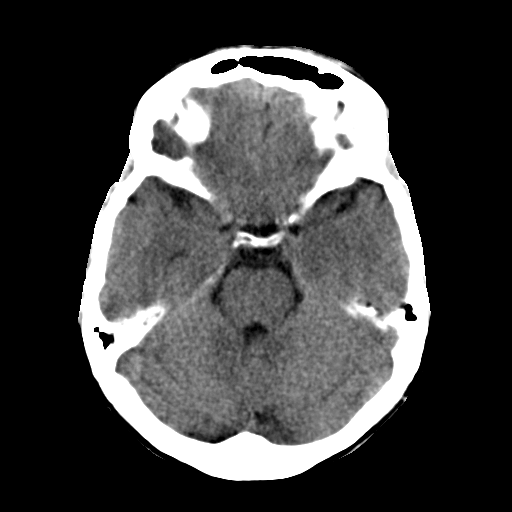
[im 9/30  bone]
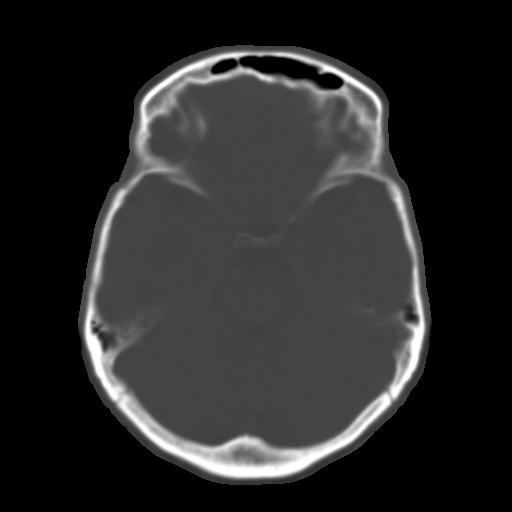
[im 11/30  brain]
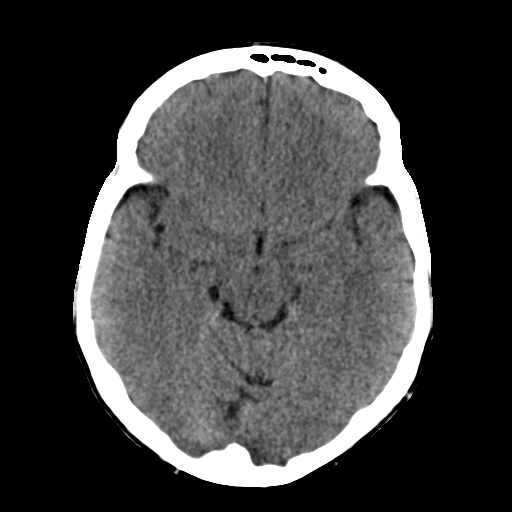
[im 13/30  brain]
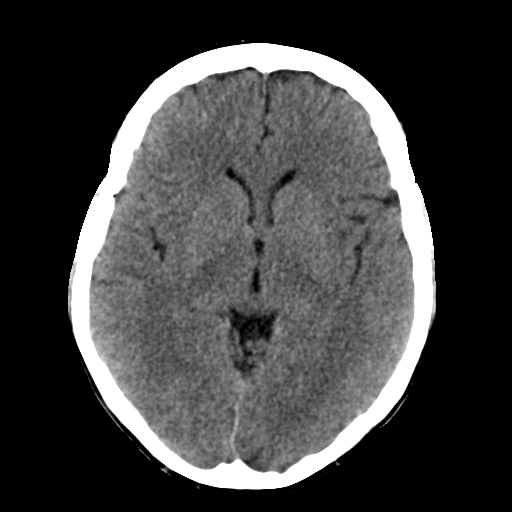
[im 15/30  brain]
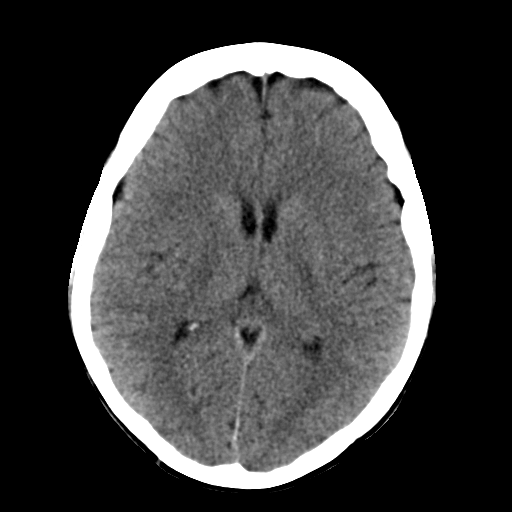
[im 16/30  brain]
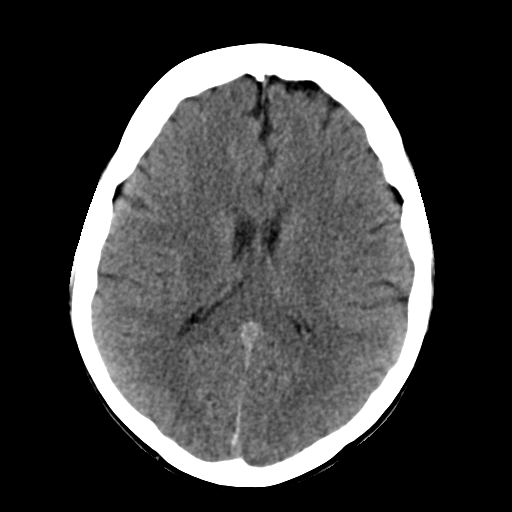
[im 16/30  bone]
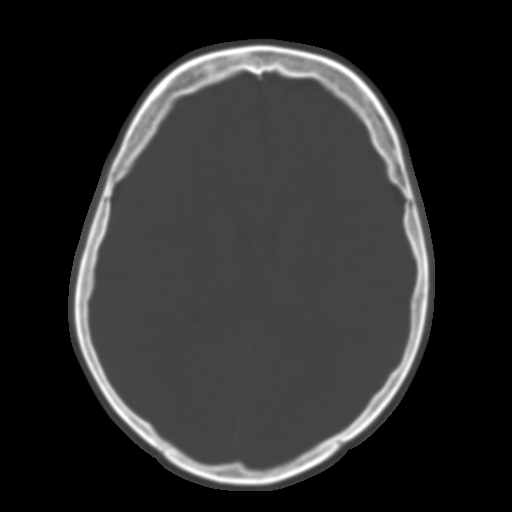
[im 18/30  brain]
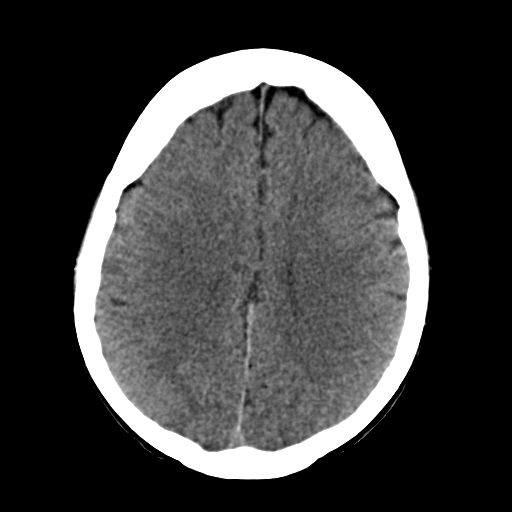
[im 20/30  brain]
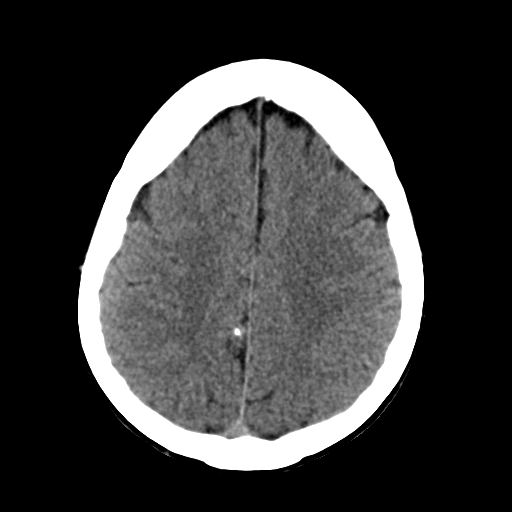
[im 22/30  brain]
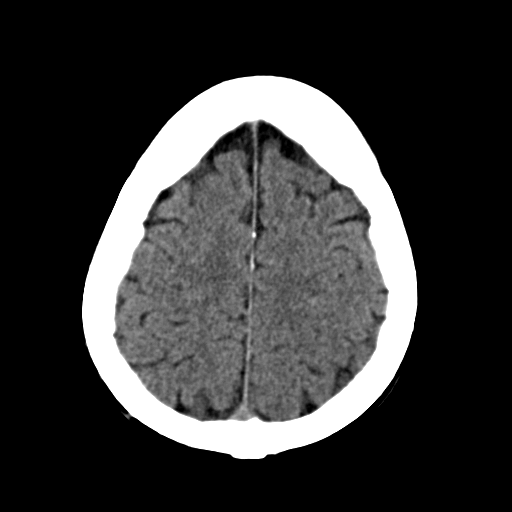
[im 23/30  brain]
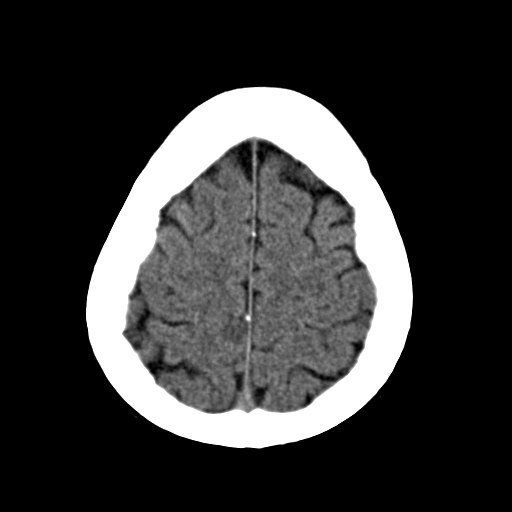
[im 23/30  bone]
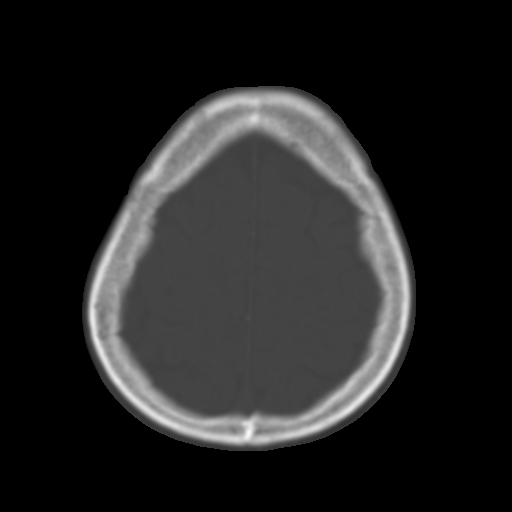
[im 25/30  brain]
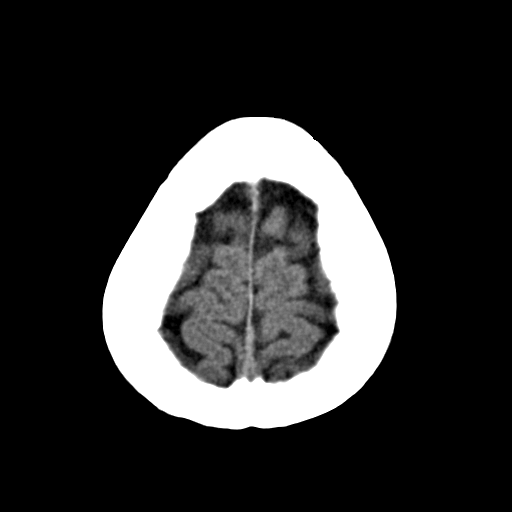
[im 27/30  brain]
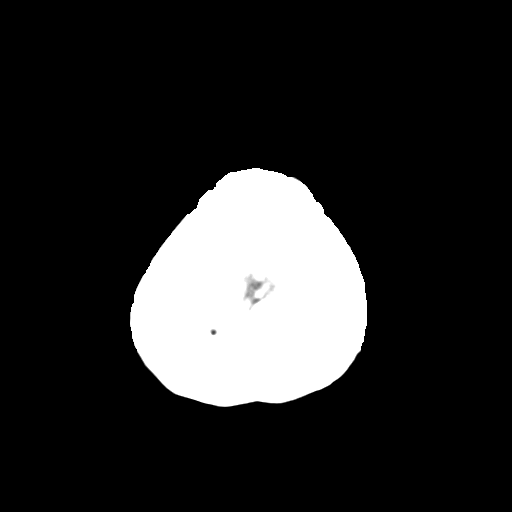
[im 29/30  brain]
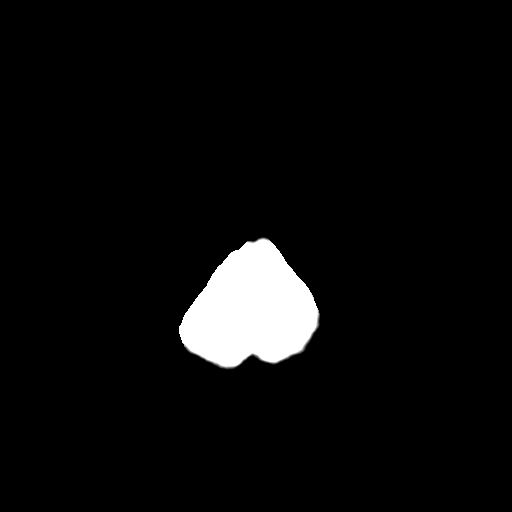

[16 of 30 positions shown; findings below may reference images not displayed]

FINDINGS: There is not evidence of intra-axial fluid collections. There is
no evidence of acute hemorrhage or secondary signs reflecting mass effect or
subacute or chronic focal territorial infarction. The osseous structures
demonstrate no evidence of a depressed skull fracture. If there is
persistent concern clinical follow-up with MRI is recommended.

The visualized paranasal sinuses and mastoid air cells are unremarkable.
IMPRESSION: 1. No evidence of acute intracranial abnormalities.

## 2014-05-31 ENCOUNTER — Encounter: Payer: Self-pay | Admitting: Internal Medicine

## 2014-06-01 MED ORDER — HYDROCODONE-ACETAMINOPHEN 7.5-325 MG PO TABS: 1.0000 | ORAL_TABLET | Freq: Four times a day (QID) | ORAL | Status: DC | PRN

## 2014-06-01 NOTE — Telephone Encounter (Signed)
Last visit 01/01/14, see mychart msg

## 2014-06-01 NOTE — Telephone Encounter (Signed)
Ok to refill,  printed rx .  And Dad's doing fine,  Thank her for asking

## 2014-06-03 ENCOUNTER — Other Ambulatory Visit: Payer: Self-pay | Admitting: Internal Medicine

## 2014-06-25 ENCOUNTER — Other Ambulatory Visit: Payer: Self-pay | Admitting: Internal Medicine

## 2014-06-29 ENCOUNTER — Encounter: Payer: Self-pay | Admitting: Internal Medicine

## 2014-07-02 MED ORDER — HYDROCODONE-ACETAMINOPHEN 7.5-325 MG PO TABS: 1.0000 | ORAL_TABLET | Freq: Four times a day (QID) | ORAL | Status: DC | PRN

## 2014-07-02 NOTE — Telephone Encounter (Signed)
Ok to refill,  printed rx  

## 2014-07-03 NOTE — Telephone Encounter (Signed)
Placed up front for pickup ° °

## 2014-07-27 ENCOUNTER — Telehealth: Payer: Self-pay | Admitting: Neurology

## 2014-07-27 ENCOUNTER — Encounter: Payer: Self-pay | Admitting: Internal Medicine

## 2014-07-27 NOTE — Telephone Encounter (Signed)
Patient called and stated that she was having serious pain her back and running down both legs.She was a previous patient of Dr. Erling Cruz and was receiving injections for the back pain around 2.5 years ago. She has not had any problems since then, up until two weeks ago.  She stated that Dr. Erling Cruz informed her to see Dr. Krista Blue if she needed to come back and has requested to do so. She states that she needs to come in as soon as possible. Please call and advise.

## 2014-07-27 NOTE — Telephone Encounter (Signed)
Spoke to patient - 30 minute appt scheduled.

## 2014-07-28 ENCOUNTER — Encounter: Payer: Self-pay | Admitting: Neurology

## 2014-07-28 ENCOUNTER — Telehealth: Payer: Self-pay | Admitting: Neurology

## 2014-07-28 ENCOUNTER — Ambulatory Visit (INDEPENDENT_AMBULATORY_CARE_PROVIDER_SITE_OTHER): Payer: 59 | Admitting: Neurology

## 2014-07-28 VITALS — Ht 66.0 in | Wt 125.0 lb

## 2014-07-28 DIAGNOSIS — M545 Low back pain, unspecified: Secondary | ICD-10-CM

## 2014-07-28 MED ORDER — TIZANIDINE HCL 4 MG PO CAPS: 4.0000 mg | ORAL_CAPSULE | Freq: Three times a day (TID) | ORAL | Status: DC

## 2014-07-28 MED ORDER — MELOXICAM 7.5 MG PO TABS: 7.5000 mg | ORAL_TABLET | Freq: Every day | ORAL | Status: DC

## 2014-07-28 MED ORDER — OXYCODONE-ACETAMINOPHEN 7.5-325 MG PO TABS: 1.0000 | ORAL_TABLET | Freq: Four times a day (QID) | ORAL | Status: DC | PRN

## 2014-07-28 MED ORDER — HYDROCODONE-ACETAMINOPHEN 7.5-500 MG PO TABS: ORAL_TABLET | ORAL | Status: DC

## 2014-07-28 NOTE — Telephone Encounter (Signed)
She wanted to make sure she was being referred to pain management.  The referral was placed today.  She is aware to expect a call for scheduling.

## 2014-07-28 NOTE — Telephone Encounter (Signed)
Patient called requesting to speak with Dr. Rhea Belton nurse regarding her referral to a pain management for her to have injections done. Please call and advise. Patient can be reached @ [phone]

## 2014-07-28 NOTE — Telephone Encounter (Signed)
Last fill on 07/02/14 ok to fill? Hydrocodone

## 2014-07-28 NOTE — Progress Notes (Signed)
PATIENT: Adrienne Robinson DOB: 1955-06-01  Chief Complaint  Patient presents with  . Back Pain    She started having worsening of pain in her lower back in May.  Pain was radiating into both legs.  She was treated by a chiropractor and it improved temporarily.  She has been using hydrocodone from her PCP but it has not been helpful.    HISTORICAL  Adrienne Robinson okay is a 59 year old female, referred by her primary care physician Dr. Kalman Jewels for evaluation of low back pain. She was a patient of Dr. Erling Cruz, last office visit was December 2013.  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 Over the years, she had multiple epidural injection,April 2009, right L3 and 4,repeat injection at L3-4 October 27 2009, right L4-25 October 2009, right L4-24 November 2009 without much benefit,  epidural steroid injection November 03 2010 at L4-5, November 17 2010 at L5-S1 on right side, January 10 2011 at L4-5 without significant benefit,  Per report, repeat MRI of lumbar February 03 2011 showed mild spondylitic changes at L2 and 3, L3 and 4, L4 and 5 facet arthropathy, mild posterior canal stenosis, moderate right side foraminal stenosis at L4-5 without definite root compression.   Previous electrodiagnostic study, most recent was in October 10 2011 showed bilateral carpal tunnel syndromes.  Her low back pain was under good control for a long time, until May 2016,without clear triggers, she began to notice midline low back pain, radiating to bilateral buttock cheek, bilateral anterior thigh, she walk on treadmill regularly, also practicing yoga, with back stretching, chiropractor, she  had temporary relief, but had severe recurrent low back pain in the mid May 2016, she has difficulty holding one position for too long, her job as a Warden/ranger require helping travel frequently, she has worsening low back pain with prolonged sitting,  She denies significant low extremity weakness, no feet paresthesia, no bowel and bladder incontinence, no significant neck pain, no hands paresthesia,  She noticed bulging right lumbar paraspinal muscles, she has tried chiropractor, massage, heating pad with limited help, she was also given hydrocodone/APAP, 7.5 as needed with limited help   REVIEW OF SYSTEMS: Full 14 system review of systems performed and notable only for Low back pain ALLERGIES: Allergies  Allergen Reactions  . Morphine And Related Rash  . Adhesive [Tape] Rash and Other (See Comments)    Redness, blisters, skin peeling off.    HOME MEDICATIONS: Current Outpatient Prescriptions  Medication Sig Dispense Refill  . amLODipine (NORVASC) 10 MG tablet TAKE 1 TABLET (10 MG TOTAL) BY MOUTH DAILY. 90 tablet 3  . buPROPion (WELLBUTRIN XL) 300 MG 24 hr tablet TAKE 1 TABLET BY MOUTH EVERY DAY 30 tablet 2  . etodolac (LODINE XL) 600 MG 24 hr tablet Take 600 mg by mouth daily.    Marland Kitchen HYDROcodone-acetaminophen (NORCO) 7.5-325 MG per tablet Take 1 tablet by mouth every 6 (six) hours as needed for severe pain. 120 tablet 0  . Linaclotide (LINZESS) 290 MCG CAPS capsule Take 1 capsule (290 mcg total) by mouth daily. 30 capsule 11  . lisinopril (PRINIVIL,ZESTRIL) 5 MG tablet Take 1 tablet (5 mg total) by mouth  daily. 90 tablet 3  . zolpidem (AMBIEN) 10 MG tablet TAKE 1 TABLET BY MOUTH AS NEEDED 31 tablet 5  . [DISCONTINUED] ferrous sulfate 325 (65 FE) MG EC tablet Take 1 tablet (325 mg total) by mouth daily after breakfast. 30 tablet 3  . [DISCONTINUED] metFORMIN (GLUCOPHAGE) 500 MG tablet Take 500 mg by mouth 2 (two) times daily with a meal.       No current facility-administered  medications for this visit.    PAST MEDICAL HISTORY: Past Medical History  Diagnosis Date  . Hypertension   . Depression   . Degenerative joint disease involving multiple joints   . Spinal stenosis of lumbar region   . Anxiety   . Dizziness   . Headache(784.0)     having since  past month and a half  . Pneumonia     approx 6 years ago  . Ulcerative proctitis   . AVM (arteriovenous malformation) brain Dec 2013    s/p embolization  Feb 26 2012, [name]  . Fibromyalgia   . Back pain     PAST SURGICAL HISTORY: Past Surgical History  Procedure Laterality Date  . Abdominal hysterectomy    . Oophorectomy    . Shoulder arthroscopy with rotator cuff repair and subacromial decompression  2007    left  . Nasal sinus surgery    . Appendectomy    . Cardiac catheterization      normal coronaries, no wall motion abnormalities 11/02/09 [facility])  . Radiology with anesthesia  02/26/2012    Procedure: RADIOLOGY WITH ANESTHESIA;  Surgeon: [name], MD;  Location: [facility];  Service: Radiology;  Laterality: N/A;  . Av fistula repair    . Tonsillectomy  adnoids  . Reverse shoulder arthroplasty Right 06/27/2013    Procedure: REVERSE RIGHT TOTAL SHOULDER ARTHROPLASTY;  Surgeon: [name], MD;  Location: [facility];  Service: Orthopedics;  Laterality: Right;    FAMILY HISTORY: Family History  Problem Relation Age of Onset  . Cancer Father 17    colon  . Thyroid disease Mother     SOCIAL HISTORY:  History   Social History  . Marital Status: Married    Spouse Name: N/A  . Number of Children: 0  . Years of Education: 18   Occupational History  . Nurse executive     Social History Main Topics  . Smoking status: Never Smoker   . Smokeless tobacco: Never Used  . Alcohol Use: 1.2 oz/week    2 Glasses of wine per week     Comment: 2-3 week with dinner  . Drug Use: No  . Sexual Activity: Not on file   Other Topics Concern  . Not on file   Social History Narrative   Lives  at home with her husband.   Right-handed.   2-3 diet cokes per day.     PHYSICAL EXAM   Filed Vitals:   07/28/14 1134  Height: 5\' 6"  (1.676 m)  Weight: 125 lb (56.7 kg)    Not recorded      Body mass index is 20.19 kg/(m^2).  PHYSICAL EXAMNIATION:  Gen: NAD, conversant, well nourised, obese, well groomed                     Cardiovascular: Regular rate rhythm, no peripheral edema, warm, nontender. Eyes: Conjunctivae clear without exudates or hemorrhage Neck: Supple, no carotid bruise. Pulmonary: Clear to auscultation bilaterally  Muscuoskeleton: mild scoliosis, right lumbar paraspinal muscle spasm, tenderness upon deep  palpitation,  NEUROLOGICAL EXAM:  MENTAL STATUS: Speech:    Speech is normal; fluent and spontaneous with normal comprehension.  Cognition:    The patient is oriented to person, place, and time;     recent and remote memory intact;     language fluent;     normal attention, concentration,     fund of knowledge.  CRANIAL NERVES: CN II: Visual fields are full to confrontation. Fundoscopic exam is normal with sharp discs and no vascular changes.   Pupils are 4 mm and briskly reactive to light.  CN III, IV, VI: extraocular movement are normal. No ptosis. CN V: Facial sensation is intact to pinprick in all 3 divisions bilaterally. Corneal responses are intact.  CN VII: Face is symmetric with normal eye closure and smile. CN VIII: Hearing is normal to rubbing fingers CN IX, X: Palate elevates symmetrically. Phonation is normal. CN XI: Head turning and shoulder shrug are intact CN XII: Tongue is midline with normal movements and no atrophy.  MOTOR: There is no pronator drift of out-stretched arms. Muscle bulk and tone are normal. Muscle strength is normal.  REFLEXES: Reflexes are 2+ and symmetric at the biceps, triceps, knees, and ankles. Plantar responses are flexor.  SENSORY: Light touch, pinprick, position sense, and vibration sense are intact in  fingers and toes.  COORDINATION: Rapid alternating movements and fine finger movements are intact. There is no dysmetria on finger-to-nose and heel-knee-shin. There are no abnormal or extraneous movements.   GAIT/STANCE: Posture is normal. Gait is steady with normal steps, base, arm swing, and turning. Heel and toe walking are normal. Tandem gait is normal.  Romberg is absent.   DIAGNOSTIC DATA (LABS, IMAGING, TESTING) - I reviewed patient records, labs, notes, testing and imaging myself where available.  Lab Results  Component Value Date   WBC 4.7 12/25/2013   HGB 11.8* 12/25/2013   HCT 35.2* 12/25/2013   MCV 93.8 12/25/2013   PLT 367.0 12/25/2013      Component Value Date/Time   NA 138 12/25/2013 0924   NA 137 09/14/2011 1624   K 4.9 12/25/2013 0924   K 4.1 09/14/2011 1624   CL 105 12/25/2013 0924   CL 101 09/14/2011 1624   CO2 26 12/25/2013 0924   CO2 25 09/14/2011 1624   GLUCOSE 83 12/25/2013 0924   GLUCOSE 105* 09/14/2011 1624   BUN 21 12/25/2013 0924   BUN 14 09/14/2011 1624   CREATININE 0.8 12/25/2013 0924   CREATININE 0.95 09/14/2011 1624   CREATININE 0.92 08/04/2011 0831   CALCIUM 9.8 12/25/2013 0924   CALCIUM 9.3 09/14/2011 1624   PROT 7.1 12/25/2013 0924   PROT 7.2 12/30/2012 0836   PROT 7.7 09/14/2011 1624   ALBUMIN 4.0 12/25/2013 0924   ALBUMIN 4.6 09/14/2011 1624   AST 20 12/25/2013 0924   AST 24 09/14/2011 1624   ALT 24 12/25/2013 0924   ALT 25 09/14/2011 1624   ALKPHOS 60 12/25/2013 0924   ALKPHOS 75 09/14/2011 1624   BILITOT 0.5 12/25/2013 0924   GFRNONAA 73* 06/28/2013 0540   GFRNONAA >60 09/14/2011 1624   GFRNONAA 70 08/04/2011 0831   GFRAA 84* 06/28/2013 0540   GFRAA >60 09/14/2011 1624   GFRAA 81 08/04/2011 0831   Lab Results  Component Value Date   CHOL 222* 12/25/2013   HDL 58.00 12/25/2013   LDLCALC 148* 12/25/2013   LDLDIRECT 160.2 04/11/2013   TRIG 82.0 12/25/2013   CHOLHDL 4 12/25/2013   Lab Results  Component Value  Date     HGBA1C 5.7 08/04/2011   Lab Results  Component Value Date   QVLDKCCQ19 012 04/11/2013   Lab Results  Component Value Date   TSH 0.89 12/25/2013    ASSESSMENT AND PLAN  BRITTIN BELNAP is a 59 y.o. female with long-standing history of chronic low back pain, with recurrent low back pain since May 2016, failed hot compression, chiropractor, Normal neurological examination, no evidence of radiculopathy on exam, well-preserved reflexes, no weakness. Previous MRI of lumbar in 2008, in 2012 showed mild degenerative disc disease, there was no significant foraminal or canal stenosis.  1.  Most likely musculoskeletal in etiology, Mobic as needed 2, back stretching exercise, Percocet as needed 3, I will refer her to pain management Dr. Luan Pulling for fluoroscopy guided epidural injections. 4. Will hold off repeating MRI lumbar at this point, return to clinic in one month      Marcial Pacas, M.D. Ph.D.  [facility] [address], [place], [place] [address] Ph: [phone] Fax: [phone]

## 2014-07-29 MED ORDER — HYDROCODONE-ACETAMINOPHEN 7.5-325 MG PO TABS: 1.0000 | ORAL_TABLET | Freq: Four times a day (QID) | ORAL | Status: DC | PRN

## 2014-07-29 MED ORDER — HYDROCODONE-ACETAMINOPHEN 10-750 MG PO TABS: 1.0000 | ORAL_TABLET | Freq: Four times a day (QID) | ORAL | Status: DC | PRN

## 2014-07-30 ENCOUNTER — Encounter: Payer: 59 | Admitting: Internal Medicine

## 2014-07-31 ENCOUNTER — Encounter: Payer: Self-pay | Admitting: Internal Medicine

## 2014-07-31 ENCOUNTER — Telehealth: Payer: Self-pay

## 2014-07-31 NOTE — Telephone Encounter (Signed)
Pharmacist at [facility] concerned that patient doesn't need Norco 7.5/325mg  prescription filled today as she had a Percocet 7.5/325mg  prescription filled on 07/28/2014.  Patient sent you a my chart message on 6/6 requesting a refill on her Norco and planned visit with Marcial Pacas on the 6/7.  Per Epic Dr. Krista Blue discontinued the [facility] and started the patient on the Percocet.  Your My Chart response was on 6/8 for the [facility] Prescription, which she read today and picked up and attempted to fill.  Explained to the pharmacist per the doctor's notes she should only be on the percocet so to not fill the Norco.  I also explained that the patient has a follow up with you on 6/16.  Conferred with Dr. Gilford Rile and she agreed as the Percocet was filled already to not fill the Norco till you could see her on the 16th.  If you have any concerns or questions please see me.

## 2014-08-03 ENCOUNTER — Ambulatory Visit: Payer: Self-pay | Admitting: Neurology

## 2014-08-04 ENCOUNTER — Ambulatory Visit: Payer: Self-pay | Admitting: Neurology

## 2014-08-06 ENCOUNTER — Other Ambulatory Visit: Payer: Self-pay | Admitting: *Deleted

## 2014-08-06 ENCOUNTER — Ambulatory Visit (INDEPENDENT_AMBULATORY_CARE_PROVIDER_SITE_OTHER): Payer: 59 | Admitting: Internal Medicine

## 2014-08-06 ENCOUNTER — Encounter: Payer: Self-pay | Admitting: Internal Medicine

## 2014-08-06 VITALS — BP 134/76 | HR 86 | Temp 98.1°F | Resp 12 | Ht 65.0 in | Wt 126.5 lb

## 2014-08-06 DIAGNOSIS — D72819 Decreased white blood cell count, unspecified: Secondary | ICD-10-CM | POA: Diagnosis not present

## 2014-08-06 DIAGNOSIS — Z0000 Encounter for general adult medical examination without abnormal findings: Secondary | ICD-10-CM

## 2014-08-06 DIAGNOSIS — E559 Vitamin D deficiency, unspecified: Secondary | ICD-10-CM

## 2014-08-06 DIAGNOSIS — E785 Hyperlipidemia, unspecified: Secondary | ICD-10-CM | POA: Diagnosis not present

## 2014-08-06 DIAGNOSIS — I10 Essential (primary) hypertension: Secondary | ICD-10-CM | POA: Diagnosis not present

## 2014-08-06 DIAGNOSIS — R5383 Other fatigue: Secondary | ICD-10-CM

## 2014-08-06 DIAGNOSIS — Z1159 Encounter for screening for other viral diseases: Secondary | ICD-10-CM

## 2014-08-06 DIAGNOSIS — M48061 Spinal stenosis, lumbar region without neurogenic claudication: Secondary | ICD-10-CM

## 2014-08-06 DIAGNOSIS — M4806 Spinal stenosis, lumbar region: Secondary | ICD-10-CM

## 2014-08-06 LAB — LIPID PANEL
Cholesterol: 216 mg/dL — ABNORMAL HIGH (ref 0–200)
HDL: 63.1 mg/dL (ref >39.00)
LDL Cholesterol: 133 mg/dL — ABNORMAL HIGH (ref 0–99)
NonHDL: 152.9
Total CHOL/HDL Ratio: 3
Triglycerides: 101 mg/dL (ref 0.0–149.0)
VLDL: 20.2 mg/dL (ref 0.0–40.0)

## 2014-08-06 LAB — CBC WITH DIFFERENTIAL/PLATELET
Basophils Absolute: 0 10*3/uL (ref 0.0–0.1)
Basophils Relative: 0.5 % (ref 0.0–3.0)
Eosinophils Absolute: 0 10*3/uL (ref 0.0–0.7)
Eosinophils Relative: 1 % (ref 0.0–5.0)
HCT: 33.8 % — ABNORMAL LOW (ref 36.0–46.0)
Hemoglobin: 11.4 g/dL — ABNORMAL LOW (ref 12.0–15.0)
Lymphocytes Relative: 22.4 % (ref 12.0–46.0)
Lymphs Abs: 1 10*3/uL (ref 0.7–4.0)
MCHC: 33.8 g/dL (ref 30.0–36.0)
MCV: 91.9 fl (ref 78.0–100.0)
Monocytes Absolute: 0.5 10*3/uL (ref 0.1–1.0)
Monocytes Relative: 11.2 % (ref 3.0–12.0)
Neutro Abs: 3 10*3/uL (ref 1.4–7.7)
Neutrophils Relative %: 64.9 % (ref 43.0–77.0)
Platelets: 312 10*3/uL (ref 150.0–400.0)
RBC: 3.67 Mil/uL — ABNORMAL LOW (ref 3.87–5.11)
RDW: 12.6 % (ref 11.5–15.5)
WBC: 4.6 10*3/uL (ref 4.0–10.5)

## 2014-08-06 LAB — COMPREHENSIVE METABOLIC PANEL
ALT: 15 U/L (ref 0–35)
AST: 16 U/L (ref 0–37)
Albumin: 4.6 g/dL (ref 3.5–5.2)
Alkaline Phosphatase: 56 U/L (ref 39–117)
BUN: 17 mg/dL (ref 6–23)
CO2: 23 mEq/L (ref 19–32)
Calcium: 9.4 mg/dL (ref 8.4–10.5)
Chloride: 101 mEq/L (ref 96–112)
Creatinine, Ser: 0.77 mg/dL (ref 0.40–1.20)
GFR: 81.67 mL/min (ref >60.00)
Glucose, Bld: 96 mg/dL (ref 70–99)
Potassium: 3.8 mEq/L (ref 3.5–5.1)
Sodium: 132 mEq/L — ABNORMAL LOW (ref 135–145)
Total Bilirubin: 0.4 mg/dL (ref 0.2–1.2)
Total Protein: 6.7 g/dL (ref 6.0–8.3)

## 2014-08-06 LAB — VITAMIN D 25 HYDROXY (VIT D DEFICIENCY, FRACTURES): VITD: 37.04 ng/mL (ref 30.00–100.00)

## 2014-08-06 LAB — TSH: TSH: 0.72 u[IU]/mL (ref 0.35–4.50)

## 2014-08-06 MED ORDER — BUPROPION HCL ER (XL) 300 MG PO TB24: 300.0000 mg | ORAL_TABLET | Freq: Every day | ORAL | Status: DC

## 2014-08-06 MED ORDER — HYDROCODONE-ACETAMINOPHEN 7.5-325 MG PO TABS: 1.0000 | ORAL_TABLET | ORAL | Status: DC | PRN

## 2014-08-06 NOTE — Patient Instructions (Signed)
I have refilled your vicodin and you can safely use 6 daily .  Health Maintenance Adopting a healthy lifestyle and getting preventive care can go a long way to promote health and wellness. Talk with your health care provider about what schedule of regular examinations is right for you. This is a good chance for you to check in with your provider about disease prevention and staying healthy. In between checkups, there are plenty of things you can do on your own. Experts have done a lot of research about which lifestyle changes and preventive measures are most likely to keep you healthy. Ask your health care provider for more information. WEIGHT AND DIET  Eat a healthy diet  Be sure to include plenty of vegetables, fruits, low-fat dairy products, and lean protein.  Do not eat a lot of foods high in solid fats, added sugars, or salt.  Get regular exercise. This is one of the most important things you can do for your health.  Most adults should exercise for at least 150 minutes each week. The exercise should increase your heart rate and make you sweat (moderate-intensity exercise).  Most adults should also do strengthening exercises at least twice a week. This is in addition to the moderate-intensity exercise.  Maintain a healthy weight  Body mass index (BMI) is a measurement that can be used to identify possible weight problems. It estimates body fat based on height and weight. Your health care provider can help determine your BMI and help you achieve or maintain a healthy weight.  For females 65 years of age and older:   A BMI below 18.5 is considered underweight.  A BMI of 18.5 to 24.9 is normal.  A BMI of 25 to 29.9 is considered overweight.  A BMI of 30 and above is considered obese.  Watch levels of cholesterol and blood lipids  You should start having your blood tested for lipids and cholesterol at 59 years of age, then have this test every 5 years.  You may need to have your  cholesterol levels checked more often if:  Your lipid or cholesterol levels are high.  You are older than 58 years of age.  You are at high risk for heart disease.  CANCER SCREENING   Lung Cancer  Lung cancer screening is recommended for adults 38-69 years old who are at high risk for lung cancer because of a history of smoking.  A yearly low-dose CT scan of the lungs is recommended for people who:  Currently smoke.  Have quit within the past 15 years.  Have at least a 30-pack-year history of smoking. A pack year is smoking an average of one pack of cigarettes a day for 1 year.  Yearly screening should continue until it has been 15 years since you quit.  Yearly screening should stop if you develop a health problem that would prevent you from having lung cancer treatment.  Breast Cancer  Practice breast self-awareness. This means understanding how your breasts normally appear and feel.  It also means doing regular breast self-exams. Let your health care provider know about any changes, no matter how small.  If you are in your 20s or 30s, you should have a clinical breast exam (CBE) by a health care provider every 1-3 years as part of a regular health exam.  If you are 30 or older, have a CBE every year. Also consider having a breast X-ray (mammogram) every year.  If you have a family history of breast  cancer, talk to your health care provider about genetic screening.  If you are at high risk for breast cancer, talk to your health care provider about having an MRI and a mammogram every year.  Breast cancer gene (BRCA) assessment is recommended for women who have family members with BRCA-related cancers. BRCA-related cancers include:  Breast.  Ovarian.  Tubal.  Peritoneal cancers.  Results of the assessment will determine the need for genetic counseling and BRCA1 and BRCA2 testing. Cervical Cancer Routine pelvic examinations to screen for cervical cancer are no  longer recommended for nonpregnant women who are considered low risk for cancer of the pelvic organs (ovaries, uterus, and vagina) and who do not have symptoms. A pelvic examination may be necessary if you have symptoms including those associated with pelvic infections. Ask your health care provider if a screening pelvic exam is right for you.   The Pap test is the screening test for cervical cancer for women who are considered at risk.  If you had a hysterectomy for a problem that was not cancer or a condition that could lead to cancer, then you no longer need Pap tests.  If you are older than 65 years, and you have had normal Pap tests for the past 10 years, you no longer need to have Pap tests.  If you have had past treatment for cervical cancer or a condition that could lead to cancer, you need Pap tests and screening for cancer for at least 20 years after your treatment.  If you no longer get a Pap test, assess your risk factors if they change (such as having a new sexual partner). This can affect whether you should start being screened again.  Some women have medical problems that increase their chance of getting cervical cancer. If this is the case for you, your health care provider may recommend more frequent screening and Pap tests.  The human papillomavirus (HPV) test is another test that may be used for cervical cancer screening. The HPV test looks for the virus that can cause cell changes in the cervix. The cells collected during the Pap test can be tested for HPV.  The HPV test can be used to screen women 8 years of age and older. Getting tested for HPV can extend the interval between normal Pap tests from three to five years.  An HPV test also should be used to screen women of any age who have unclear Pap test results.  After 59 years of age, women should have HPV testing as often as Pap tests.  Colorectal Cancer  This type of cancer can be detected and often  prevented.  Routine colorectal cancer screening usually begins at 59 years of age and continues through 59 years of age.  Your health care provider may recommend screening at an earlier age if you have risk factors for colon cancer.  Your health care provider may also recommend using home test kits to check for hidden blood in the stool.  A small camera at the end of a tube can be used to examine your colon directly (sigmoidoscopy or colonoscopy). This is done to check for the earliest forms of colorectal cancer.  Routine screening usually begins at age 32.  Direct examination of the colon should be repeated every 5-10 years through 59 years of age. However, you may need to be screened more often if early forms of precancerous polyps or small growths are found. Skin Cancer  Check your skin from head to toe  regularly.  Tell your health care provider about any new moles or changes in moles, especially if there is a change in a mole's shape or color.  Also tell your health care provider if you have a mole that is larger than the size of a pencil eraser.  Always use sunscreen. Apply sunscreen liberally and repeatedly throughout the day.  Protect yourself by wearing long sleeves, pants, a wide-brimmed hat, and sunglasses whenever you are outside. HEART DISEASE, DIABETES, AND HIGH BLOOD PRESSURE   Have your blood pressure checked at least every 1-2 years. High blood pressure causes heart disease and increases the risk of stroke.  If you are between 57 years and 25 years old, ask your health care provider if you should take aspirin to prevent strokes.  Have regular diabetes screenings. This involves taking a blood sample to check your fasting blood sugar level.  If you are at a normal weight and have a low risk for diabetes, have this test once every three years after 59 years of age.  If you are overweight and have a high risk for diabetes, consider being tested at a younger age or more  often. PREVENTING INFECTION  Hepatitis B  If you have a higher risk for hepatitis B, you should be screened for this virus. You are considered at high risk for hepatitis B if:  You were born in a country where hepatitis B is common. Ask your health care provider which countries are considered high risk.  Your parents were born in a high-risk country, and you have not been immunized against hepatitis B (hepatitis B vaccine).  You have HIV or AIDS.  You use needles to inject street drugs.  You live with someone who has hepatitis B.  You have had sex with someone who has hepatitis B.  You get hemodialysis treatment.  You take certain medicines for conditions, including cancer, organ transplantation, and autoimmune conditions. Hepatitis C  Blood testing is recommended for:  Everyone born from [birth year] through 1965.  Anyone with known risk factors for hepatitis C. Sexually transmitted infections (STIs)  You should be screened for sexually transmitted infections (STIs) including gonorrhea and chlamydia if:  You are sexually active and are younger than 59 years of age.  You are older than 59 years of age and your health care provider tells you that you are at risk for this type of infection.  Your sexual activity has changed since you were last screened and you are at an increased risk for chlamydia or gonorrhea. Ask your health care provider if you are at risk.  If you do not have HIV, but are at risk, it may be recommended that you take a prescription medicine daily to prevent HIV infection. This is called pre-exposure prophylaxis (PrEP). You are considered at risk if:  You are sexually active and do not regularly use condoms or know the HIV status of your partner(s).  You take drugs by injection.  You are sexually active with a partner who has HIV. Talk with your health care provider about whether you are at high risk of being infected with HIV. If you choose to begin PrEP, you  should first be tested for HIV. You should then be tested every 3 months for as long as you are taking PrEP.  PREGNANCY   If you are premenopausal and you may become pregnant, ask your health care provider about preconception counseling.  If you may become pregnant, take 400 to 800 micrograms (mcg) of  folic acid every day.  If you want to prevent pregnancy, talk to your health care provider about birth control (contraception). OSTEOPOROSIS AND MENOPAUSE   Osteoporosis is a disease in which the bones lose minerals and strength with aging. This can result in serious bone fractures. Your risk for osteoporosis can be identified using a bone density scan.  If you are 18 years of age or older, or if you are at risk for osteoporosis and fractures, ask your health care provider if you should be screened.  Ask your health care provider whether you should take a calcium or vitamin D supplement to lower your risk for osteoporosis.  Menopause may have certain physical symptoms and risks.  Hormone replacement therapy may reduce some of these symptoms and risks. Talk to your health care provider about whether hormone replacement therapy is right for you.  HOME CARE INSTRUCTIONS   Schedule regular health, dental, and eye exams.  Stay current with your immunizations.   Do not use any tobacco products including cigarettes, chewing tobacco, or electronic cigarettes.  If you are pregnant, do not drink alcohol.  If you are breastfeeding, limit how much and how often you drink alcohol.  Limit alcohol intake to no more than 1 drink per day for nonpregnant women. One drink equals 12 ounces of beer, 5 ounces of wine, or 1 ounces of hard liquor.  Do not use street drugs.  Do not share needles.  Ask your health care provider for help if you need support or information about quitting drugs.  Tell your health care provider if you often feel depressed.  Tell your health care provider if you have ever  been abused or do not feel safe at home. Document Released: 08/22/2010 Document Revised: 06/23/2013 Document Reviewed: 01/08/2013 [facility] Patient Information 2015 [place], [place]. This information is not intended to replace advice given to you by your health care provider. Make sure you discuss any questions you have with your health care provider.

## 2014-08-06 NOTE — Telephone Encounter (Signed)
Fax from pharmacy, requesting, 90 day supply

## 2014-08-06 NOTE — Progress Notes (Signed)
Patient ID: Adrienne Robinson, female    DOB: July 22, 1955  Age: 59 y.o. MRN: [id number]  The patient is here for annual wellness examination and management of other chronic and acute problems.   Normal annual skin exam with tara Nicole Kindred Dec 2015 Taking 2000 IUs daily of D3  last colonsocopy  Was normal by Paul Oh 2013, records needed    The risk  factors are reflected in the social history.  The roster of all physicians providing medical care to patient - is listed in the Snapshot section of the chart.  Activities of daily living:  The patient is 100% independent in all ADLs: dressing, toileting, feeding as well as independent mobility  Home safety : The patient has smoke detectors in the home. They wear seatbelts.  There are no firearms at home. There is no violence in the home.   There is no risks for hepatitis, STDs or HIV. There is no   history of blood transfusion. They have no travel history to infectious disease endemic areas of the world.  The patient has seen their dentist in the last six month. They have seen their eye doctor in the last year. They admit to slight hearing difficulty with regard to whispered voices and some television programs.  They have deferred audiologic testing in the last year.  They do not  have excessive sun exposure. Discussed the need for sun protection: hats, long sleeves and use of sunscreen if there is significant sun exposure.   Diet: the importance of a healthy diet is discussed. They do have a healthy diet.  The benefits of regular aerobic exercise were discussed. She walks 4 times per week ,  20 minutes.   Depression screen: there are no signs or vegative symptoms of depression- irritability, change in appetite, anhedonia, sadness/tearfullness.  Cognitive assessment: the patient manages all their financial and personal affairs and is actively engaged. They could relate day,date,year and events; recalled 2/3 objects at 3 minutes; performed clock-face  test normally.  The following portions of the patient's history were reviewed and updated as appropriate: allergies, current medications, past family history, past medical history,  past surgical history, past social history  and problem list.  Visual acuity was not assessed per patient preference since she has regular follow up with her ophthalmologist. Hearing and body mass index were assessed and reviewed.   During the course of the visit the patient was educated and counseled about appropriate screening and preventive services including : fall prevention , diabetes screening, nutrition counseling, colorectal cancer screening, and recommended immunizations.    CC: The primary encounter diagnosis was Essential hypertension. Diagnoses of Leukopenia, Hyperlipidemia, Vitamin D deficiency, Need for hepatitis C screening test, Other fatigue, Spinal stenosis of lumbar region, and Encounter for preventive health examination were also pertinent to this visit.  Follow up on back pain    She is having diffuse pain secondary to constant back pain due to scoliosis and DJD disease.   Pain is relieved once she goes to bed,  After 10 minutes of standing she starts to have pain all the way up spine, and now radiating to both hands and left leg worse than right  . Sees Dr Rockne Menghini Has been seeing chiropractor [facility]  Dr Freddi Che.  as appt with Pain Clinic on July 23  Medications reviewed. Prior trials of LYRICA CAUSED EXCESSIVE SEDATION,  AND GABAPENTIN Caused her to feel altered.  zanafex mades her too sedated,  Not taking meloxican bc using  alevel , Diffuse pain secondary to constan back pain  scoliosis and DJD disease.   Pain is relieved nce she goe s to bed,  After 10 minutes of standing she starts to have pain all the way up spine, and now radiating to both hands and left leg worse than right  . Sees Dr Rockne Menghini Has been seeing chiropractor [facility]  Dr Freddi Che Has appt with Pain Clinic on July 23. zanafex mades her too  sedated,  Not taking meloxican bc using aleve.   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   She has past surgical history that includes Abdominal hysterectomy; Oophorectomy; Shoulder arthroscopy with rotator cuff repair and subacromial decompression (2007); Nasal sinus surgery; Appendectomy; Cardiac catheterization; Radiology with anesthesia (02/26/2012); AV fistula repair; Tonsillectomy (adnoids); and Reverse shoulder arthroplasty (Right, 06/27/2013).   Her family history includes Cancer (age of onset: 75) in her father; Thyroid disease in her mother.She reports that she has never smoked. She has never used smokeless tobacco. She reports that she drinks about 1.2 oz of alcohol per week. She reports that she does not use illicit drugs.  Outpatient Prescriptions Prior to Visit  Medication Sig Dispense Refill  . amLODipine (NORVASC) 10 MG tablet TAKE 1 TABLET (10 MG TOTAL) BY MOUTH DAILY. 90 tablet 3  . Linaclotide (LINZESS) 290 MCG CAPS capsule Take 1 capsule (290 mcg total) by mouth daily. 30 capsule 11  . lisinopril (PRINIVIL,ZESTRIL) 5 MG tablet Take 1 tablet (5 mg total) by mouth daily. 90 tablet 3  . meloxicam (MOBIC) 7.5 MG tablet Take 1 tablet (7.5 mg total) by mouth daily. 60 tablet 3  . zolpidem (AMBIEN) 10 MG tablet TAKE 1 TABLET BY MOUTH AS NEEDED 31 tablet 5  . buPROPion (WELLBUTRIN XL) 300 MG 24 hr tablet TAKE 1 TABLET BY MOUTH EVERY DAY 30 tablet 2  . HYDROcodone-acetaminophen (NORCO) 7.5-325 MG per tablet Take 1 tablet by mouth every 6 (six) hours as needed for moderate pain. 120 tablet 0  . oxyCODONE-acetaminophen (PERCOCET) 7.5-325 MG per tablet Take 1  tablet by mouth every 6 (six) hours as needed for severe pain. 90 tablet 0  . tiZANidine (ZANAFLEX) 4 MG capsule Take 1 capsule (4 mg total) by mouth 3 (three) times daily. (Patient not taking: Reported on 08/06/2014) 90 capsule 3   No facility-administered medications prior to visit.    Review of Systems   Patient denies headache, fevers, malaise, unintentional weight loss, skin rash, eye pain, sinus congestion and sinus pain, sore throat, dysphagia,  hemoptysis , cough, dyspnea, wheezing, chest pain, palpitations, orthopnea, edema, abdominal pain, nausea, melena, diarrhea, constipation, flank pain, dysuria, hematuria, urinary  Frequency, nocturia, numbness, tingling, seizures,  Focal weakness, Loss of consciousness,  Tremor, insomnia, depression, anxiety, and suicidal ideation.      Objective:  BP 134/76 mmHg  Pulse 86  Temp(Src) 98.1 F (36.7 C) (Oral)  Resp 12  Ht 5\' 5"  (1.651 m)  Wt 126 lb 8 oz (57.38 kg)  BMI 21.05 kg/m2  SpO2 99%  Physical Exam   General appearance: alert, cooperative and appears stated age Head: Normocephalic, without obvious abnormality, atraumatic Eyes: conjunctivae/corneas clear. PERRL, EOM's intact. Fundi benign. Ears: normal TM's and external ear canals both ears  Nose: Nares normal. Septum midline. Mucosa normal. No drainage or sinus tenderness. Throat: lips, mucosa, and tongue normal; teeth and gums normal Neck: no adenopathy, no carotid bruit, no JVD, supple, symmetrical, trachea midline and thyroid not enlarged, symmetric, no tenderness/mass/nodules Lungs: clear to auscultation bilaterally Breasts: normal appearance, no masses or tenderness Heart: regular rate and rhythm, S1, S2 normal, no murmur, click, rub or gallop Abdomen: soft, non-tender; bowel sounds normal; no masses,  no organomegaly Extremities: extremities normal, atraumatic, no cyanosis or edema Pulses: 2+ and symmetric Skin: Skin color, texture, turgor normal. No rashes or  lesions Neurologic: Alert and oriented X 3, normal strength and tone. Normal symmetric reflexes. Normal coordination and gait.     Assessment & Plan:   Problem List Items Addressed This Visit    Spinal stenosis of lumbar region    Complicated by scoliosis. Her pain is inadequately controlled on the current vicodin regimen but she did not tolerate the percocet she was prrescribed and requested a refill on the vicodin.  She has surrendered the medication.  New rx for vicoidn to use every 4 hours until she is seen by the Pain clinic.  reviewed the conditions of her narcotic contract.       Encounter for preventive health examination    Annual wellness  exam was done as well as a comprehensive physical exam and management of acute and chronic conditions .  During the course of the visit the patient was educated and counseled about appropriate screening and preventive services including :  diabetes screening, lipid analysis with projected  10 year  risk for CAD , nutrition counseling, colorectal cancer screening, and recommended immunizations.  Printed recommendations for health maintenance screenings was given.       Hypertension - Primary   Relevant Orders   Comprehensive metabolic panel (Completed)   Leukopenia   Relevant Orders   CBC with Differential/Platelet (Completed)    Other Visit Diagnoses    Hyperlipidemia        Relevant Orders    Lipid panel (Completed)    Vitamin D deficiency        Relevant Orders    Vit D  25 hydroxy (rtn osteoporosis monitoring) (Completed)    Need for hepatitis C screening test        Relevant Orders    Hepatitis C antibody (Completed)    Other fatigue        Relevant Orders    TSH (Completed)       I have discontinued Ms. Barraclough's tiZANidine and oxyCODONE-acetaminophen. I have also changed her HYDROcodone-acetaminophen. Additionally, I am having her maintain her lisinopril, Linaclotide, zolpidem, amLODipine, and meloxicam.  Meds ordered this  encounter  Medications  . HYDROcodone-acetaminophen (NORCO) 7.5-325 MG per tablet    Sig: Take 1 tablet by mouth every 4 (four) hours as needed for moderate pain.    Dispense:  180 tablet    Refill:  0    PATIENT HAS RETURNED THE OXYCODONE TO DR Abed Schar    Medications Discontinued During This Encounter  Medication Reason  . tiZANidine (ZANAFLEX) 4 MG capsule Patient Preference  . oxyCODONE-acetaminophen (PERCOCET) 7.5-325 MG per tablet   . HYDROcodone-acetaminophen (NORCO) 7.5-325 MG per tablet Reorder    Follow-up: No Follow-up on file.   [name], MD

## 2014-08-06 NOTE — Progress Notes (Signed)
Pre-visit discussion using our clinic review tool. No additional management support is needed unless otherwise documented below in the visit note.  

## 2014-08-07 LAB — HEPATITIS C ANTIBODY: HCV Ab: NEGATIVE

## 2014-08-08 DIAGNOSIS — Z0000 Encounter for general adult medical examination without abnormal findings: Secondary | ICD-10-CM | POA: Insufficient documentation

## 2014-08-08 NOTE — Assessment & Plan Note (Signed)

## 2014-08-08 NOTE — Assessment & Plan Note (Signed)
Complicated by scoliosis. Her pain is inadequately controlled on the current vicodin regimen but sh edid not tolerate the percocet she was pprrescribed and has surrendered the medication.  New rx for vicoidn to use every 4 hours until she is seen by the Pain clinic

## 2014-08-09 ENCOUNTER — Encounter: Payer: Self-pay | Admitting: Internal Medicine

## 2014-08-25 ENCOUNTER — Other Ambulatory Visit: Payer: Self-pay | Admitting: Internal Medicine

## 2014-08-25 ENCOUNTER — Encounter: Payer: Self-pay | Admitting: Internal Medicine

## 2014-08-25 DIAGNOSIS — M5412 Radiculopathy, cervical region: Secondary | ICD-10-CM

## 2014-08-25 DIAGNOSIS — M4722 Other spondylosis with radiculopathy, cervical region: Principal | ICD-10-CM

## 2014-08-25 DIAGNOSIS — M4712 Other spondylosis with myelopathy, cervical region: Secondary | ICD-10-CM

## 2014-08-27 ENCOUNTER — Telehealth (HOSPITAL_COMMUNITY): Payer: Self-pay

## 2014-08-27 NOTE — Telephone Encounter (Signed)
Called pt to schedule consult with Dr. Estanislado Pandy for new symptoms, left message for pt to call back. AW

## 2014-08-31 NOTE — Telephone Encounter (Signed)
Probably not, again insurance will often not pay for two studies on the same day.  The cervical spine MIR has been ordered and the office will call you when it is scheduled (my chart)

## 2014-09-03 MED ORDER — HYDROCODONE-ACETAMINOPHEN 7.5-325 MG PO TABS: 1.0000 | ORAL_TABLET | ORAL | Status: DC | PRN

## 2014-09-04 ENCOUNTER — Encounter: Payer: Self-pay | Admitting: Internal Medicine

## 2014-09-04 NOTE — Progress Notes (Signed)
Patient called and stated she was to pick up script for hydrocodone, script left on nurses desk was placed at front desk for pick up patient aware.

## 2014-09-08 ENCOUNTER — Encounter: Payer: Self-pay | Admitting: Internal Medicine

## 2014-09-10 ENCOUNTER — Telehealth (HOSPITAL_COMMUNITY): Payer: Self-pay | Admitting: Interventional Radiology

## 2014-09-10 ENCOUNTER — Ambulatory Visit: Payer: 59

## 2014-09-10 ENCOUNTER — Other Ambulatory Visit (HOSPITAL_COMMUNITY): Payer: Self-pay | Admitting: Interventional Radiology

## 2014-09-10 DIAGNOSIS — Q2730 Arteriovenous malformation, site unspecified: Secondary | ICD-10-CM

## 2014-09-10 DIAGNOSIS — I671 Cerebral aneurysm, nonruptured: Secondary | ICD-10-CM

## 2014-09-10 NOTE — Telephone Encounter (Signed)
Called pt, left VM for her to call me back to schedule follow-up CTA head. JM

## 2014-09-15 ENCOUNTER — Encounter: Payer: Self-pay | Admitting: Internal Medicine

## 2014-09-24 ENCOUNTER — Encounter: Payer: Self-pay | Admitting: Internal Medicine

## 2014-09-29 ENCOUNTER — Other Ambulatory Visit: Payer: Self-pay | Admitting: Interventional Radiology

## 2014-09-29 ENCOUNTER — Other Ambulatory Visit (HOSPITAL_COMMUNITY): Payer: Self-pay | Admitting: Interventional Radiology

## 2014-09-29 DIAGNOSIS — Q2730 Arteriovenous malformation, site unspecified: Secondary | ICD-10-CM

## 2014-09-29 DIAGNOSIS — I729 Aneurysm of unspecified site: Secondary | ICD-10-CM

## 2014-09-30 ENCOUNTER — Other Ambulatory Visit: Payer: Self-pay | Admitting: Interventional Radiology

## 2014-10-07 ENCOUNTER — Encounter: Payer: Self-pay | Admitting: Internal Medicine

## 2014-10-07 MED ORDER — HYDROCODONE-ACETAMINOPHEN 7.5-325 MG PO TABS: 1.0000 | ORAL_TABLET | ORAL | Status: DC | PRN

## 2014-10-07 NOTE — Telephone Encounter (Signed)
Ok to refill,  printed rx  For pickup tomorrow

## 2014-10-07 NOTE — Telephone Encounter (Signed)
Please advise to Hydrocodone refill last fill 09/05/14 for 180.

## 2014-10-08 ENCOUNTER — Encounter: Payer: Self-pay | Admitting: Internal Medicine

## 2014-10-16 ENCOUNTER — Other Ambulatory Visit (HOSPITAL_COMMUNITY): Payer: Self-pay | Admitting: Interventional Radiology

## 2014-10-16 DIAGNOSIS — I729 Aneurysm of unspecified site: Secondary | ICD-10-CM

## 2014-10-16 DIAGNOSIS — Q2730 Arteriovenous malformation, site unspecified: Secondary | ICD-10-CM

## 2014-10-20 ENCOUNTER — Other Ambulatory Visit: Payer: Self-pay | Admitting: *Deleted

## 2014-10-20 MED ORDER — LINACLOTIDE 290 MCG PO CAPS: 290.0000 ug | ORAL_CAPSULE | Freq: Every day | ORAL | Status: DC

## 2014-10-28 ENCOUNTER — Ambulatory Visit (HOSPITAL_COMMUNITY): Payer: 59

## 2014-11-10 ENCOUNTER — Ambulatory Visit (HOSPITAL_COMMUNITY): Admission: RE | Admit: 2014-11-10 | Payer: 59 | Source: Ambulatory Visit

## 2014-11-11 ENCOUNTER — Encounter: Payer: Self-pay | Admitting: Internal Medicine

## 2014-11-11 MED ORDER — HYDROCODONE-ACETAMINOPHEN 7.5-325 MG PO TABS: 1.0000 | ORAL_TABLET | ORAL | Status: DC | PRN

## 2014-11-14 ENCOUNTER — Other Ambulatory Visit: Payer: Self-pay | Admitting: Internal Medicine

## 2014-11-16 ENCOUNTER — Other Ambulatory Visit: Payer: Self-pay

## 2014-11-16 MED ORDER — LINACLOTIDE 290 MCG PO CAPS: 290.0000 ug | ORAL_CAPSULE | Freq: Every day | ORAL | Status: DC

## 2014-11-16 NOTE — Telephone Encounter (Signed)
Ok to fill 

## 2014-11-17 ENCOUNTER — Ambulatory Visit (HOSPITAL_COMMUNITY)
Admission: RE | Admit: 2014-11-17 | Discharge: 2014-11-17 | Disposition: A | Discharge status: Home / Self Care | Payer: 59 | Source: Ambulatory Visit | Attending: Interventional Radiology | Admitting: Interventional Radiology

## 2014-11-17 NOTE — Telephone Encounter (Signed)
Ok to refill,  printed rx  

## 2014-12-13 ENCOUNTER — Other Ambulatory Visit: Payer: Self-pay | Admitting: Internal Medicine

## 2014-12-14 NOTE — Telephone Encounter (Signed)
Please advise on refill of Flonase. I do not see medication on list

## 2014-12-14 NOTE — Telephone Encounter (Signed)
Ok to refill,  Refill sent  

## 2014-12-16 ENCOUNTER — Encounter: Payer: Self-pay | Admitting: Internal Medicine

## 2014-12-17 MED ORDER — HYDROCODONE-ACETAMINOPHEN 7.5-325 MG PO TABS: 1.0000 | ORAL_TABLET | ORAL | Status: DC | PRN

## 2014-12-17 NOTE — Telephone Encounter (Signed)
vicodin refill  For 2 months printed

## 2015-01-26 ENCOUNTER — Encounter: Payer: Self-pay | Admitting: Nurse Practitioner

## 2015-01-26 ENCOUNTER — Ambulatory Visit (INDEPENDENT_AMBULATORY_CARE_PROVIDER_SITE_OTHER): Payer: 59 | Admitting: Nurse Practitioner

## 2015-01-26 ENCOUNTER — Other Ambulatory Visit: Payer: Self-pay

## 2015-01-26 ENCOUNTER — Other Ambulatory Visit: Payer: Self-pay | Admitting: Internal Medicine

## 2015-01-26 VITALS — BP 120/68 | HR 72 | Temp 98.3°F | Wt 129.8 lb

## 2015-01-26 DIAGNOSIS — J069 Acute upper respiratory infection, unspecified: Secondary | ICD-10-CM | POA: Diagnosis not present

## 2015-01-26 MED ORDER — AMOXICILLIN-POT CLAVULANATE 875-125 MG PO TABS: 1.0000 | ORAL_TABLET | Freq: Two times a day (BID) | ORAL | Status: DC

## 2015-01-26 MED ORDER — HYDROCOD POLST-CPM POLST ER 10-8 MG/5ML PO SUER: 5.0000 mL | Freq: Every evening | ORAL | Status: DC | PRN

## 2015-01-26 MED ORDER — BUPROPION HCL ER (XL) 300 MG PO TB24: 300.0000 mg | ORAL_TABLET | Freq: Every day | ORAL | Status: DC

## 2015-01-26 NOTE — Patient Instructions (Signed)
5 mL (1 teaspoon) of cough syrup. Don't drive or make important decisions while taking this....just sleep and rest.   Please take a probiotic ( Align, Floraque or Culturelle) while you are on the antibiotic to prevent a serious antibiotic associated diarrhea  Called clostirudium dificile colitis and a vaginal yeast infection.

## 2015-01-26 NOTE — Progress Notes (Signed)
Patient ID: Adrienne Robinson, female    DOB: September 01, 1955  Age: 59 y.o. MRN: [id number]  CC: Nasal Congestion   HPI [name] presents for Nasal Congestion x 2 weeks.   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   She has past surgical history that includes Abdominal hysterectomy; Oophorectomy; Shoulder arthroscopy with rotator cuff repair and subacromial decompression (2007); Nasal sinus surgery; Appendectomy; Cardiac catheterization; Radiology with anesthesia (02/26/2012); AV fistula repair; Tonsillectomy (adnoids); and Reverse shoulder arthroplasty (Right, 06/27/2013).   Her family history includes Cancer (age of onset: 22) in her father; Thyroid disease in her mother.She reports that she has never smoked. She has never used smokeless tobacco. She reports that she drinks about 1.2 oz of alcohol per week. She reports that she does not use illicit drugs.  Outpatient Prescriptions Prior to Visit  Medication Sig Dispense Refill  . amLODipine (NORVASC) 10 MG tablet TAKE 1 TABLET (10 MG TOTAL) BY MOUTH DAILY. 90 tablet 3  . fluticasone (FLONASE) 50 MCG/ACT nasal spray USE 2 SPRAYS IN EACH NOSTRIL DAILY AS NEEDED 16 g 5  . HYDROcodone-acetaminophen (NORCO) 7.5-325 MG tablet Take 1 tablet by mouth every 4 (four) hours as needed for moderate pain. 180 tablet 0  . HYDROcodone-acetaminophen (NORCO) 7.5-325 MG tablet Take 1 tablet by mouth every 4 (four) hours as needed  for moderate pain. 180 tablet 0  . Linaclotide (LINZESS) 290 MCG CAPS capsule Take 1 capsule (290 mcg total) by mouth daily. 90 capsule 5  . lisinopril (PRINIVIL,ZESTRIL) 5 MG tablet TAKE 1 TABLET (5 MG TOTAL) BY MOUTH DAILY. 90 tablet 1  . meloxicam (MOBIC) 7.5 MG tablet Take 1 tablet (7.5 mg total) by mouth daily. 60 tablet 3  . zolpidem (AMBIEN) 10 MG tablet TAKE 1 TABLET BY MOUTH AT BEDTIME AS NEEDED FOR SLEEP 31 tablet 3  . buPROPion (WELLBUTRIN XL) 300 MG 24 hr tablet Take 1 tablet (300 mg total) by mouth daily. 90 tablet 0   No facility-administered medications prior to visit.    ROS Review of Systems  Constitutional: Negative for fever, chills, diaphoresis and fatigue.  HENT: Positive for congestion, ear pain, postnasal drip, rhinorrhea, sinus pressure and sneezing. Negative for ear discharge.   Respiratory: Positive for cough. Negative for chest tightness, shortness of breath and wheezing.   Cardiovascular: Negative for chest pain, palpitations and leg swelling.  Gastrointestinal: Negative for nausea, vomiting and diarrhea.  Skin: Negative for rash.  Neurological: Positive for headaches. Negative for dizziness, weakness and numbness.    Objective:  BP 120/68 mmHg  Pulse 72  Temp(Src) 98.3 F (36.8 C) (Oral)  Wt 129 lb 12.8 oz (58.877 kg)  SpO2 96%  Physical Exam  Constitutional: She is oriented to person, place, and time. She appears well-developed and well-nourished. No distress.  HENT:  Head: Normocephalic and atraumatic.  Right Ear: External ear normal.  Left Ear: External ear normal.  Mouth/Throat: No oropharyngeal exudate.  Boggy turbinates TM's injected bilaterally without bulging or retraction  Eyes: EOM are normal. Pupils are equal, round, and  reactive to light. Right eye exhibits no discharge. Left eye exhibits no discharge. No scleral icterus.  Neck: Normal range of motion. Neck supple.  Cardiovascular: Normal rate, regular rhythm and normal heart sounds.   Exam reveals no gallop and no friction rub.   No murmur heard. Pulmonary/Chest: Effort normal and breath sounds normal. No respiratory distress. She has no wheezes. She has no rales. She exhibits no tenderness.  Lymphadenopathy:    She has no cervical adenopathy.  Neurological: She is alert and oriented to person, place, and time. No cranial nerve deficit. She exhibits normal muscle tone. Coordination normal.  Skin: Skin is warm and dry. No rash noted. She is not diaphoretic.  Psychiatric: She has a normal mood and affect. Her behavior is normal. Judgment and thought content normal.   Assessment & Plan:   Lydian was seen today for nasal congestion.  Diagnoses and all orders for this visit:  Acute URI  Other orders -     amoxicillin-clavulanate (AUGMENTIN) 875-125 MG tablet; Take 1 tablet by mouth 2 (two) times daily. -     chlorpheniramine-HYDROcodone (TUSSIONEX PENNKINETIC ER) 10-8 MG/5ML SUER; Take 5 mLs by mouth at bedtime as needed for cough.  I am having Ms. Krutz start on amoxicillin-clavulanate and chlorpheniramine-HYDROcodone. I am also having her maintain her amLODipine, meloxicam, lisinopril, zolpidem, Linaclotide, fluticasone, HYDROcodone-acetaminophen, and HYDROcodone-acetaminophen.  Meds ordered this encounter  Medications  . amoxicillin-clavulanate (AUGMENTIN) 875-125 MG tablet    Sig: Take 1 tablet by mouth 2 (two) times daily.    Dispense:  14 tablet    Refill:  0    Order Specific Question:  Supervising Provider    Answer:  Deborra Medina L [[id number]]  . chlorpheniramine-HYDROcodone (TUSSIONEX PENNKINETIC ER) 10-8 MG/5ML SUER    Sig: Take 5 mLs by mouth at bedtime as needed for cough.    Dispense:  180 mL    Refill:  0    Order Specific Question:  Supervising Provider    Answer:  [name] [[id number]]     Follow-up: Return if symptoms worsen or fail to improve.

## 2015-02-03 DIAGNOSIS — J0100 Acute maxillary sinusitis, unspecified: Secondary | ICD-10-CM | POA: Insufficient documentation

## 2015-02-03 NOTE — Assessment & Plan Note (Addendum)
New onset. Script for tussionex given to pt with instructions verbal and written on AVS. Not for use with Norco- pt verbalized understanding. Augmentin twice daily x 7 days, flonase continue use. Encouraged probiotics. FU prn worsening/failure to improve.

## 2015-02-12 ENCOUNTER — Ambulatory Visit (INDEPENDENT_AMBULATORY_CARE_PROVIDER_SITE_OTHER): Payer: 59 | Admitting: Internal Medicine

## 2015-02-12 ENCOUNTER — Encounter: Payer: Self-pay | Admitting: Internal Medicine

## 2015-02-12 VITALS — BP 114/70 | HR 73 | Temp 97.8°F | Resp 12 | Ht 65.0 in | Wt 128.1 lb

## 2015-02-12 DIAGNOSIS — J0101 Acute recurrent maxillary sinusitis: Secondary | ICD-10-CM | POA: Diagnosis not present

## 2015-02-12 DIAGNOSIS — M064 Inflammatory polyarthropathy: Secondary | ICD-10-CM | POA: Diagnosis not present

## 2015-02-12 DIAGNOSIS — I10 Essential (primary) hypertension: Secondary | ICD-10-CM | POA: Diagnosis not present

## 2015-02-12 DIAGNOSIS — M130 Polyarthritis, unspecified: Secondary | ICD-10-CM

## 2015-02-12 DIAGNOSIS — H65191 Other acute nonsuppurative otitis media, right ear: Secondary | ICD-10-CM

## 2015-02-12 LAB — COMPREHENSIVE METABOLIC PANEL
ALT: 17 U/L (ref 0–35)
AST: 17 U/L (ref 0–37)
Albumin: 4.6 g/dL (ref 3.5–5.2)
Alkaline Phosphatase: 59 U/L (ref 39–117)
BUN: 25 mg/dL — ABNORMAL HIGH (ref 6–23)
CO2: 24 mEq/L (ref 19–32)
Calcium: 9.7 mg/dL (ref 8.4–10.5)
Chloride: 104 mEq/L (ref 96–112)
Creatinine, Ser: 1.11 mg/dL (ref 0.40–1.20)
GFR: 53.45 mL/min — ABNORMAL LOW (ref >60.00)
Glucose, Bld: 93 mg/dL (ref 70–99)
Potassium: 4.2 mEq/L (ref 3.5–5.1)
Sodium: 136 mEq/L (ref 135–145)
Total Bilirubin: 0.3 mg/dL (ref 0.2–1.2)
Total Protein: 7 g/dL (ref 6.0–8.3)

## 2015-02-12 LAB — CBC WITH DIFFERENTIAL/PLATELET
Basophils Absolute: 0 10*3/uL (ref 0.0–0.1)
Basophils Relative: 0.6 % (ref 0.0–3.0)
Eosinophils Absolute: 0.1 10*3/uL (ref 0.0–0.7)
Eosinophils Relative: 2 % (ref 0.0–5.0)
HCT: 33.5 % — ABNORMAL LOW (ref 36.0–46.0)
Hemoglobin: 11.2 g/dL — ABNORMAL LOW (ref 12.0–15.0)
Lymphocytes Relative: 36.9 % (ref 12.0–46.0)
Lymphs Abs: 1.4 10*3/uL (ref 0.7–4.0)
MCHC: 33.5 g/dL (ref 30.0–36.0)
MCV: 93.4 fl (ref 78.0–100.0)
Monocytes Absolute: 0.4 10*3/uL (ref 0.1–1.0)
Monocytes Relative: 11.4 % (ref 3.0–12.0)
Neutro Abs: 1.9 10*3/uL (ref 1.4–7.7)
Neutrophils Relative %: 49.1 % (ref 43.0–77.0)
Platelets: 316 10*3/uL (ref 150.0–400.0)
RBC: 3.59 Mil/uL — ABNORMAL LOW (ref 3.87–5.11)
RDW: 12.9 % (ref 11.5–15.5)
WBC: 3.8 10*3/uL — ABNORMAL LOW (ref 4.0–10.5)

## 2015-02-12 MED ORDER — HYDROCODONE-ACETAMINOPHEN 7.5-325 MG PO TABS: 1.0000 | ORAL_TABLET | ORAL | Status: DC | PRN

## 2015-02-12 MED ORDER — ZOLPIDEM TARTRATE 10 MG PO TABS: 10.0000 mg | ORAL_TABLET | Freq: Every evening | ORAL | Status: DC | PRN

## 2015-02-12 MED ORDER — LEVOFLOXACIN 500 MG PO TABS: 500.0000 mg | ORAL_TABLET | Freq: Every day | ORAL | Status: DC

## 2015-02-12 NOTE — Progress Notes (Signed)
Subjective:  Patient ID: Adrienne Robinson, female    DOB: 12/25/1955  Age: 59 y.o. MRN: [id number]  CC: The primary encounter diagnosis was Acute nonsuppurative otitis media of right ear. Diagnoses of Essential hypertension, Acute recurrent maxillary sinusitis, and Polyarthritis of multiple sites [facility]) were also pertinent to this visit.  HPI Adrienne Robinson presents for follow up on chronic pain  Secondary to spinal stenosis , OA shoulder,  And continued sinus and ear pain.  She was treated by Lorane Gell, NP  with a 7 day course of Augmentin and tussionex on Dec 6th for  right ear pain and sinus congestion consistent with sinusitis/otitis .  Symptoms improved but did not resolve and she continues to have right ear pain without fevers or headahces,  But due ot persistnet drainage has had laryngitis for the past 4 days   Has seen Pain Management for back and neck pain,  Flying weekly or every other week which aggravates her neck pain due to prlonged awkaward positioning.  She is opioid dependent for management of pain , buy would like to  able to manage pain without narcotics but thus fr has been unable to tolerate pain without use of opioids.   Outpatient Prescriptions Prior to Visit  Medication Sig Dispense Refill  . amLODipine (NORVASC) 10 MG tablet TAKE 1 TABLET (10 MG TOTAL) BY MOUTH DAILY. 90 tablet 3  . buPROPion (WELLBUTRIN XL) 300 MG 24 hr tablet Take 1 tablet (300 mg total) by mouth daily. 90 tablet 0  . fluticasone (FLONASE) 50 MCG/ACT nasal spray USE 2 SPRAYS IN EACH NOSTRIL DAILY AS NEEDED 16 g 5  . Linaclotide (LINZESS) 290 MCG CAPS capsule Take 1 capsule (290 mcg total) by mouth daily. 90 capsule 5  . lisinopril (PRINIVIL,ZESTRIL) 5 MG tablet TAKE 1 TABLET (5 MG TOTAL) BY MOUTH DAILY. 90 tablet 1  . meloxicam (MOBIC) 7.5 MG tablet Take 1 tablet (7.5 mg total) by mouth daily. 60 tablet 3  . HYDROcodone-acetaminophen (NORCO) 7.5-325 MG tablet Take 1 tablet by mouth every 4 (four)  hours as needed for moderate pain. 180 tablet 0  . HYDROcodone-acetaminophen (NORCO) 7.5-325 MG tablet Take 1 tablet by mouth every 4 (four) hours as needed for moderate pain. 180 tablet 0  . zolpidem (AMBIEN) 10 MG tablet TAKE 1 TABLET BY MOUTH AT BEDTIME AS NEEDED FOR SLEEP 31 tablet 3  . amoxicillin-clavulanate (AUGMENTIN) 875-125 MG tablet Take 1 tablet by mouth 2 (two) times daily. 14 tablet 0  . chlorpheniramine-HYDROcodone (TUSSIONEX PENNKINETIC ER) 10-8 MG/5ML SUER Take 5 mLs by mouth at bedtime as needed for cough. (Patient not taking: Reported on 02/12/2015) 180 mL 0   No facility-administered medications prior to visit.    Review of Systems;  Patient denies headache, fevers, malaise, unintentional weight loss, skin rash, eye pain, sinus congestion and sinus pain, sore throat, dysphagia,  hemoptysis , cough, dyspnea, wheezing, chest pain, palpitations, orthopnea, edema, abdominal pain, nausea, melena, diarrhea, constipation, flank pain, dysuria, hematuria, urinary  Frequency, nocturia, numbness, tingling, seizures,  Focal weakness, Loss of consciousness,  Tremor, insomnia, depression, anxiety, and suicidal ideation.      Objective:  BP 114/70 mmHg  Pulse 73  Temp(Src) 97.8 F (36.6 C) (Oral)  Resp 12  Ht 5\' 5"  (1.651 m)  Wt 128 lb 2 oz (58.117 kg)  BMI 21.32 kg/m2  SpO2 98%  BP Readings from Last 3 Encounters:  02/12/15 114/70  01/26/15 120/68  08/06/14 134/76    Wt Readings  from Last 3 Encounters:  02/12/15 128 lb 2 oz (58.117 kg)  01/26/15 129 lb 12.8 oz (58.877 kg)  08/06/14 126 lb 8 oz (57.38 kg)    General appearance: alert, cooperative and appears stated age Ears: normal TM's and external ear canals both ears Throat: lips, mucosa, and tongue normal; teeth and gums normal Neck: no adenopathy, no carotid bruit, supple, symmetrical, trachea midline and thyroid not enlarged, symmetric, no tenderness/mass/nodules Back: symmetric, no curvature. ROM normal. No CVA  tenderness. Lungs: clear to auscultation bilaterally Heart: regular rate and rhythm, S1, S2 normal, no murmur, click, rub or gallop Abdomen: soft, non-tender; bowel sounds normal; no masses,  no organomegaly Pulses: 2+ and symmetric Skin: Skin color, texture, turgor normal. No rashes or lesions Lymph nodes: Cervical, supraclavicular, and axillary nodes normal.  Lab Results  Component Value Date   HGBA1C 5.7 08/04/2011    Lab Results  Component Value Date   CREATININE 1.11 02/12/2015   CREATININE 0.77 08/06/2014   CREATININE 0.8 12/25/2013    Lab Results  Component Value Date   WBC 3.8* 02/12/2015   HGB 11.2* 02/12/2015   HCT 33.5* 02/12/2015   PLT 316.0 02/12/2015   GLUCOSE 93 02/12/2015   CHOL 216* 08/06/2014   TRIG 101.0 08/06/2014   HDL 63.10 08/06/2014   LDLDIRECT 160.2 04/11/2013   LDLCALC 133* 08/06/2014   ALT 17 02/12/2015   AST 17 02/12/2015   NA 136 02/12/2015   K 4.2 02/12/2015   CL 104 02/12/2015   CREATININE 1.11 02/12/2015   BUN 25* 02/12/2015   CO2 24 02/12/2015   TSH 0.72 08/06/2014   INR 0.95 07/12/2012   HGBA1C 5.7 08/04/2011   MICROALBUR 1.6 11/01/2011    Ir Radiologist Eval & Mgmt  11/17/2014  EXAM: ESTABLISHED PATIENT OFFICE VISIT CHIEF COMPLAINT: Follow-up to 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 Thereafter, the patient underwent follow-up CT angiogram of the brain, the most recent being approximately 03/20/2013. The patient is here accompanied by her husband to follow-up following her recent MRI of the brain and MRA of the brain having being done earlier this month. Clinically the patient remains asymptomatic without any headaches, visual aberrations, seizures, loss of  awareness symptoms, speech difficulties, balance problems, or of motor, sensory or gait instability. Constitutionally, the patient reports no symptoms of nausea, vomiting, fever or chills. Her past medical history is as per previously. Past surgical history of left rotator cuff surgery, right total shoulder replacement recently, total abdominal hysterectomy and exploratory laparotomy. Medications at this time lisinopril, Norvasc, Wellbutrin, Ambien, Linzess, vitamin-C and multivitamins. Patient is allergic adhesive and Morphine. Social History: The patient continues to work and is active. She denies any smoking of tobacco or illicit chemicals. She has an occasional glass of wine. Family History: Significant for colon cancer, diabetes, high blood pressure and thyroid problems. PHYSICAL EXAMINATION: In no acute distress.  Affect normal. Neurologically intact. ASSESSMENT AND PLAN: Patient's recent MRI/MRA of the brain was compared with her previous CT angiograms and catheter angiograms. The MRI reveals no new T2 or FLAIR or gradient recall echo abnormalities. No obvious prominence of the vasculature in veins and arteries around the previously embolized nidus is noticeable without associated gliosis on the MRI of the brain. The MRA of the brain demonstrates no gross prominence of the right  pericallosal artery. However the region of interest was not included on the MRA examination for evaluation. This was brought to the attention of the patient and her husband. Given the fact the patient is clinically stable and no obvious gross change is seen on the MRI of the brain, it was felt the patient could wait for her next follow-up imaging study in the form of the CT angiogram of the brain and of the neck to be performed in about 6 months time. In the meantime, she has been encouraged to continue taking her antihypertensives, and stay away from secondary smoke. Patient and her husband leave with good understanding and agreement  with above management plan. Electronically Signed   By: Luanne Bras M.D.   On: 11/17/2014 12:59    Assessment & Plan:   Problem List Items Addressed This Visit    Polyarthritis of multiple sites [facility])    With multiple etiologies including DJD and FM contributing. Refill on Vicodin given.  referral to alternative therapy (acupuncture) discussed       Sinusitis, acute maxillary    Persistent symptoms now with ear pain,  rx levaquin x 10 days,  Im Dep Medrol,  , sinus irrigation with NeilMed's       Relevant Medications   levofloxacin (LEVAQUIN) 500 MG tablet   Hypertension   Relevant Orders   Comprehensive metabolic panel (Completed)    Other Visit Diagnoses    Acute nonsuppurative otitis media of right ear    -  Primary    Relevant Medications    levofloxacin (LEVAQUIN) 500 MG tablet    Other Relevant Orders    CBC with Differential/Platelet (Completed)      A total of 25 minutes of face to face time was spent with patient more than half of which was spent in counselling about the above mentioned conditions  and coordination of care  I have discontinued Ms. Varady's amoxicillin-clavulanate and chlorpheniramine-HYDROcodone. I have also changed her HYDROcodone-acetaminophen and zolpidem. Additionally, I am having her start on levofloxacin. Lastly, I am having her maintain her amLODipine, meloxicam, lisinopril, Linaclotide, fluticasone, buPROPion, multivitamin with minerals, glucosamine-chondroitin, vitamin C, and HYDROcodone-acetaminophen.  Meds ordered this encounter  Medications  . Multiple Vitamins-Minerals (MULTIVITAMIN WITH MINERALS) tablet    Sig: Take 1 tablet by mouth daily.  Marland Kitchen glucosamine-chondroitin 500-400 MG tablet    Sig: Take 1 tablet by mouth daily.  . Ascorbic Acid (VITAMIN C) 1000 MG tablet    Sig: Take 1,000 mg by mouth daily.  Marland Kitchen DISCONTD: HYDROcodone-acetaminophen (NORCO) 7.5-325 MG tablet    Sig: Take 1 tablet by mouth every 4 (four) hours as needed  for moderate pain.    Dispense:  180 tablet    Refill:  0    May refill on or after February 16 2015  . HYDROcodone-acetaminophen (NORCO) 7.5-325 MG tablet    Sig: Take 1 tablet by mouth every 4 (four) hours as needed for moderate pain. May refill on or after March 19 2015    Dispense:  180 tablet    Refill:  0  . HYDROcodone-acetaminophen (NORCO) 7.5-325 MG tablet    Sig: Take 1 tablet by mouth every 4 (four) hours as needed for moderate pain.    Dispense:  180 tablet    Refill:  0    May refill on or after April 19 2015  . levofloxacin (LEVAQUIN) 500 MG tablet    Sig: Take 1 tablet (500 mg total) by mouth daily.    Dispense:  10 tablet    Refill:  0  . zolpidem (AMBIEN) 10 MG tablet    Sig: Take 1 tablet (10 mg total) by mouth at bedtime as needed. for sleep    Dispense:  31 tablet    Refill:  5    This request is for a new prescription for a controlled substance as required by Federal/State law..    Medications Discontinued During This Encounter  Medication Reason  . amoxicillin-clavulanate (AUGMENTIN) 875-125 MG tablet Completed Course  . chlorpheniramine-HYDROcodone (TUSSIONEX PENNKINETIC ER) 10-8 MG/5ML SUER Completed Course  . HYDROcodone-acetaminophen (NORCO) 7.5-325 MG tablet Reorder  . HYDROcodone-acetaminophen (NORCO) 7.5-325 MG tablet Reorder  . HYDROcodone-acetaminophen (NORCO) 7.5-325 MG tablet Reorder  . zolpidem (AMBIEN) 10 MG tablet Reorder    Follow-up: No Follow-up on file.   [name], MD

## 2015-02-12 NOTE — Progress Notes (Signed)
Pre-visit discussion using our clinic review tool. No additional management support is needed unless otherwise documented below in the visit note.  

## 2015-02-12 NOTE — Patient Instructions (Addendum)
I am treating you for otitis which is a complication from persistent sinus congestion.   I am prescribing an antibiotic (levaquin) and giving you a dose of Depo Medrol .   To manage the infection and the inflammation in your ear/sinuses.   I also advise use of the following OTC meds to help with your other symptoms.   Afrin nasal spray twice daily .  Continue regular use of Flonase  flush your sinuses once daily with NEIL MEDS sinus rinse (do over the sink because if you do it right you will spit out globs of mucus)

## 2015-02-15 NOTE — Assessment & Plan Note (Signed)
Persistent symptoms now with ear pain,  rx levaquin x 10 days,  Im Dep Medrol,  , sinus irrigation with NeilMed's

## 2015-02-15 NOTE — Assessment & Plan Note (Signed)
With multiple etiologies including DJD and FM contributing. Refill on Vicodin given.  referral to alternative therapy (acupuncture) discussed

## 2015-02-16 ENCOUNTER — Encounter: Payer: Self-pay | Admitting: Internal Medicine

## 2015-02-23 ENCOUNTER — Encounter: Payer: Self-pay | Admitting: Internal Medicine

## 2015-02-23 ENCOUNTER — Other Ambulatory Visit (HOSPITAL_COMMUNITY): Payer: Self-pay | Admitting: Interventional Radiology

## 2015-02-23 DIAGNOSIS — H9203 Otalgia, bilateral: Secondary | ICD-10-CM

## 2015-02-23 DIAGNOSIS — R519 Headache, unspecified: Secondary | ICD-10-CM

## 2015-02-23 DIAGNOSIS — M542 Cervicalgia: Secondary | ICD-10-CM

## 2015-02-23 DIAGNOSIS — R51 Headache: Principal | ICD-10-CM

## 2015-02-23 DIAGNOSIS — I729 Aneurysm of unspecified site: Secondary | ICD-10-CM

## 2015-02-24 MED ORDER — PREDNISONE 10 MG PO TABS: ORAL_TABLET | ORAL | Status: DC

## 2015-02-24 NOTE — Telephone Encounter (Signed)
Pt [phone] called to check the status of the presnidone per her and Dr Derrel Nip conversation yesterday via email. [facility]acy is [id number] [address], [place]. Thank You!

## 2015-02-25 ENCOUNTER — Other Ambulatory Visit: Payer: Self-pay | Admitting: Internal Medicine

## 2015-03-01 ENCOUNTER — Other Ambulatory Visit: Payer: Self-pay | Admitting: Internal Medicine

## 2015-03-01 ENCOUNTER — Other Ambulatory Visit: Payer: Self-pay | Admitting: Radiology

## 2015-03-03 ENCOUNTER — Encounter: Payer: Self-pay | Admitting: Internal Medicine

## 2015-03-03 ENCOUNTER — Other Ambulatory Visit: Payer: Self-pay | Admitting: Radiology

## 2015-03-03 NOTE — Telephone Encounter (Signed)
FYI

## 2015-03-03 NOTE — Telephone Encounter (Signed)
Pt called to check if Dr Derrel Nip received her email. Thank you!

## 2015-03-04 ENCOUNTER — Telehealth: Payer: Self-pay | Admitting: *Deleted

## 2015-03-04 ENCOUNTER — Encounter (HOSPITAL_COMMUNITY): Payer: Self-pay

## 2015-03-04 ENCOUNTER — Other Ambulatory Visit (HOSPITAL_COMMUNITY): Payer: Self-pay | Admitting: Interventional Radiology

## 2015-03-04 ENCOUNTER — Ambulatory Visit (HOSPITAL_COMMUNITY)
Admission: RE | Admit: 2015-03-04 | Discharge: 2015-03-04 | Disposition: A | Discharge status: Home / Self Care | Payer: 59 | Source: Ambulatory Visit | Attending: Interventional Radiology | Admitting: Interventional Radiology

## 2015-03-04 ENCOUNTER — Other Ambulatory Visit: Payer: Self-pay | Admitting: Internal Medicine

## 2015-03-04 DIAGNOSIS — Z7982 Long term (current) use of aspirin: Secondary | ICD-10-CM | POA: Insufficient documentation

## 2015-03-04 DIAGNOSIS — R42 Dizziness and giddiness: Secondary | ICD-10-CM | POA: Diagnosis not present

## 2015-03-04 DIAGNOSIS — I729 Aneurysm of unspecified site: Secondary | ICD-10-CM

## 2015-03-04 DIAGNOSIS — I10 Essential (primary) hypertension: Secondary | ICD-10-CM | POA: Diagnosis not present

## 2015-03-04 DIAGNOSIS — R519 Headache, unspecified: Secondary | ICD-10-CM

## 2015-03-04 DIAGNOSIS — M542 Cervicalgia: Secondary | ICD-10-CM

## 2015-03-04 DIAGNOSIS — K5120 Ulcerative (chronic) proctitis without complications: Secondary | ICD-10-CM | POA: Insufficient documentation

## 2015-03-04 DIAGNOSIS — F329 Major depressive disorder, single episode, unspecified: Secondary | ICD-10-CM | POA: Insufficient documentation

## 2015-03-04 DIAGNOSIS — M1990 Unspecified osteoarthritis, unspecified site: Secondary | ICD-10-CM | POA: Insufficient documentation

## 2015-03-04 DIAGNOSIS — M4806 Spinal stenosis, lumbar region: Secondary | ICD-10-CM | POA: Insufficient documentation

## 2015-03-04 DIAGNOSIS — H9203 Otalgia, bilateral: Secondary | ICD-10-CM

## 2015-03-04 DIAGNOSIS — F419 Anxiety disorder, unspecified: Secondary | ICD-10-CM | POA: Insufficient documentation

## 2015-03-04 DIAGNOSIS — M797 Fibromyalgia: Secondary | ICD-10-CM | POA: Diagnosis not present

## 2015-03-04 DIAGNOSIS — Z87728 Personal history of other specified (corrected) congenital malformations of nervous system and sense organs: Secondary | ICD-10-CM | POA: Diagnosis not present

## 2015-03-04 DIAGNOSIS — R51 Headache: Secondary | ICD-10-CM | POA: Diagnosis present

## 2015-03-04 LAB — BASIC METABOLIC PANEL
Anion gap: 9 (ref 5–15)
BUN: 26 mg/dL — ABNORMAL HIGH (ref 6–20)
CO2: 25 mmol/L (ref 22–32)
Calcium: 9.2 mg/dL (ref 8.9–10.3)
Chloride: 103 mmol/L (ref 101–111)
Creatinine, Ser: 1.05 mg/dL — ABNORMAL HIGH (ref 0.44–1.00)
GFR calc Af Amer: >60 mL/min (ref >60)
GFR calc non Af Amer: 57 mL/min — ABNORMAL LOW (ref >60)
Glucose, Bld: 83 mg/dL (ref 65–99)
Potassium: 4.2 mmol/L (ref 3.5–5.1)
Sodium: 137 mmol/L (ref 135–145)

## 2015-03-04 LAB — PROTIME-INR
INR: 0.94 (ref 0.00–1.49)
Prothrombin Time: 12.8 seconds (ref 11.6–15.2)

## 2015-03-04 LAB — CBC
HCT: 32.1 % — ABNORMAL LOW (ref 36.0–46.0)
Hemoglobin: 10.9 g/dL — ABNORMAL LOW (ref 12.0–15.0)
MCH: 31 pg (ref 26.0–34.0)
MCHC: 34 g/dL (ref 30.0–36.0)
MCV: 91.2 fL (ref 78.0–100.0)
Platelets: 291 10*3/uL (ref 150–400)
RBC: 3.52 MIL/uL — ABNORMAL LOW (ref 3.87–5.11)
RDW: 13 % (ref 11.5–15.5)
WBC: 4.8 10*3/uL (ref 4.0–10.5)

## 2015-03-04 LAB — APTT: aPTT: 31 seconds (ref 24–37)

## 2015-03-04 MED ORDER — MIDAZOLAM HCL 2 MG/2ML IJ SOLN
INTRAMUSCULAR | Status: AC
  Filled 2015-03-04: qty 2

## 2015-03-04 MED ORDER — FENTANYL CITRATE (PF) 100 MCG/2ML IJ SOLN
INTRAMUSCULAR | Status: AC
  Filled 2015-03-04: qty 2

## 2015-03-04 MED ORDER — PREDNISONE 10 MG PO TABS: ORAL_TABLET | ORAL | Status: DC

## 2015-03-04 MED ORDER — SODIUM CHLORIDE 0.9 % IV SOLN
INTRAVENOUS | Status: AC | PRN
  Administered 2015-03-04: 75 mL/h via INTRAVENOUS

## 2015-03-04 MED ORDER — FENTANYL CITRATE (PF) 100 MCG/2ML IJ SOLN
INTRAMUSCULAR | Status: AC | PRN
  Administered 2015-03-04: 12.5 ug via INTRAVENOUS
  Administered 2015-03-04: 25 ug via INTRAVENOUS
  Administered 2015-03-04: 12.5 ug via INTRAVENOUS

## 2015-03-04 MED ORDER — LIDOCAINE HCL 1 % IJ SOLN
INTRAMUSCULAR | Status: AC
  Filled 2015-03-04: qty 20

## 2015-03-04 MED ORDER — HEPARIN SOD (PORK) LOCK FLUSH 100 UNIT/ML IV SOLN
INTRAVENOUS | Status: AC
  Filled 2015-03-04: qty 15

## 2015-03-04 MED ORDER — SODIUM CHLORIDE 0.9 % IV SOLN: INTRAVENOUS | Status: AC

## 2015-03-04 MED ORDER — SODIUM CHLORIDE 0.9 % IV SOLN
Freq: Once | INTRAVENOUS | Status: AC
  Administered 2015-03-04: 1000 mL via INTRAVENOUS

## 2015-03-04 MED ORDER — MIDAZOLAM HCL 2 MG/2ML IJ SOLN
INTRAMUSCULAR | Status: AC | PRN
  Administered 2015-03-04: 0.5 mg via INTRAVENOUS
  Administered 2015-03-04: 1 mg via INTRAVENOUS

## 2015-03-04 MED ORDER — HEPARIN SOD (PORK) LOCK FLUSH 100 UNIT/ML IV SOLN
INTRAVENOUS | Status: AC | PRN
  Administered 2015-03-04: 1000 [IU] via INTRAVENOUS

## 2015-03-04 NOTE — Telephone Encounter (Signed)
Patient stated that she would like have a Prednisone,due to her not feeling well. She was seen in the office 02/12/15 [facility] [address]

## 2015-03-04 NOTE — Telephone Encounter (Signed)
Called to notify patient that her Mychart message has been forwarded to MD.

## 2015-03-04 NOTE — Procedures (Signed)
S/P 4 vessel cerebral arteriogram. RT CFA approach. Findings. 1.small residual Rt parietal avm nidus measuring approx  3.2 mm x 3 .69mm with single cortical draining vein

## 2015-03-04 NOTE — H&P (Signed)
Chief Complaint: Patient was seen in consultation today for cerebral arteriogram at the request of [name],Sanjeev  Referring Physician(s): [name],Sanjeev  History of Present Illness: Adrienne Robinson is a 60 y.o. female with hx of previously treated intracranial AVM. She has done very well, but most recent imaging studies were of subpar quality and she is scheduled for cerebral arteriogram today. She has been having some headaches and earaches, which are not likely related and she has been getting some treatment from her PCP for that. She otherwise feels well. Has been NPO this morning.  Past Medical History  Diagnosis Date  . Hypertension   . Depression   . Degenerative joint disease involving multiple joints   . Spinal stenosis of lumbar region   . Anxiety   . Dizziness   . Headache(784.0)     having since  past month and a half  . Pneumonia     approx 6 years ago  . Ulcerative proctitis ([place])   . AVM (arteriovenous malformation) brain Dec 2013    s/p embolization  Feb 26 2012, [name]  . Fibromyalgia   . Back pain     Past Surgical History  Procedure Laterality Date  . Abdominal hysterectomy    . Oophorectomy    . Shoulder arthroscopy with rotator cuff repair and subacromial decompression  2007    left  . Nasal sinus surgery    . Appendectomy    . Cardiac catheterization      normal coronaries, no wall motion abnormalities 11/02/09 [facility])  . Radiology with anesthesia  02/26/2012    Procedure: RADIOLOGY WITH ANESTHESIA;  Surgeon: [name], MD;  Location: [facility];  Service: Radiology;  Laterality: N/A;  . Av fistula repair    . Tonsillectomy  adnoids  . Reverse shoulder arthroplasty Right 06/27/2013    Procedure: REVERSE RIGHT TOTAL SHOULDER ARTHROPLASTY;  Surgeon: [name], MD;  Location: [place] [facility];  Service: Orthopedics;  Laterality: Right;    Allergies: Morphine and related and Adhesive  Medications: Prior to Admission medications     Medication Sig Start Date End Date Taking? Authorizing Provider  amLODipine (NORVASC) 10 MG tablet TAKE 1 TABLET (10 MG TOTAL) BY MOUTH DAILY. 06/03/14  Yes [name], MD  Ascorbic Acid (VITAMIN C) 1000 MG tablet Take 1,000 mg by mouth daily.   Yes Historical Provider, MD  aspirin EC 81 MG tablet Take 81 mg by mouth daily.   Yes Historical Provider, MD  buPROPion (WELLBUTRIN XL) 300 MG 24 hr tablet Take 1 tablet (300 mg total) by mouth daily. 01/26/15  Yes [name], MD  fluticasone (FLONASE) 50 MCG/ACT nasal spray USE 2 SPRAYS IN EACH NOSTRIL DAILY AS NEEDED 12/14/14  Yes [name], MD  glucosamine-chondroitin 500-400 MG tablet Take 1 tablet by mouth daily.   Yes Historical Provider, MD  HYDROcodone-acetaminophen (NORCO) 7.5-325 MG tablet Take 1 tablet by mouth every 4 (four) hours as needed for moderate pain. May refill on or after March 19 2015 02/12/15  Yes [name], MD  Linaclotide [facility]) 290 MCG CAPS capsule Take 1 capsule (290 mcg total) by mouth daily. 11/16/14  Yes [name], MD  lisinopril (PRINIVIL,ZESTRIL) 5 MG tablet TAKE 1 TABLET BY MOUTH DAILY. 03/01/15  Yes [name], MD  meloxicam (MOBIC) 7.5 MG tablet Take 1 tablet (7.5 mg total) by mouth daily. 07/28/14  Yes Marcial Pacas, MD  Multiple Vitamins-Minerals (MULTIVITAMIN WITH MINERALS) tablet Take 1 tablet by  mouth daily.   Yes Historical Provider, MD  zolpidem (AMBIEN) 10 MG tablet Take 1 tablet (10 mg total) by mouth at bedtime as needed. for sleep 02/12/15  Yes [name], MD  buPROPion (WELLBUTRIN XL) 300 MG 24 hr tablet TAKE 1 TABLET (300 MG TOTAL) BY MOUTH DAILY. Patient not taking: Reported on 03/02/2015 02/25/15   [name], MD  HYDROcodone-acetaminophen (NORCO) 7.5-325 MG tablet Take 1 tablet by mouth every 4 (four) hours as needed for moderate pain. Patient not taking: Reported on 03/02/2015 02/12/15   [name], MD     Family History  Problem Relation Age of Onset  . Cancer Father 38     colon  . Thyroid disease Mother     Social History   Social History  . Marital Status: Married    Spouse Name: N/A  . Number of Children: 0  . Years of Education: 18   Occupational History  . Nurse executive     Social History Main Topics  . Smoking status: Never Smoker   . Smokeless tobacco: Never Used  . Alcohol Use: 1.2 oz/week    2 Glasses of wine per week     Comment: 2-3 week with dinner  . Drug Use: No  . Sexual Activity: Not Asked   Other Topics Concern  . None   Social History Narrative   Lives at home with her husband.   Right-handed.   2-3 diet cokes per day.     Review of Systems: A 12 point ROS discussed and pertinent positives are indicated in the HPI above.  All other systems are negative.  Review of Systems  Vital Signs: BP 132/86 mmHg  Pulse 70  Temp(Src) 98 F (36.7 C) (Oral)  Resp 18  Ht 5' 6.5" (1.689 m)  Wt 125 lb (56.7 kg)  BMI 19.88 kg/m2  SpO2 99%  Physical Exam  Constitutional: She is oriented to person, place, and time. She appears well-developed and well-nourished. No distress.  HENT:  Head: Normocephalic.  Mouth/Throat: Oropharynx is clear and moist.  Neck: Normal range of motion. No tracheal deviation present.  Cardiovascular: Normal rate, regular rhythm, normal heart sounds and intact distal pulses.   Pulmonary/Chest: Effort normal and breath sounds normal. No respiratory distress.  Abdominal: Soft. She exhibits no mass. There is no tenderness.  Neurological: She is alert and oriented to person, place, and time.  Psychiatric: She has a normal mood and affect. Judgment normal.    Mallampati Score:  MD Evaluation Airway: WNL Heart: WNL Abdomen: WNL Chest/ Lungs: WNL ASA  Classification: 2 Mallampati/Airway Score: One  Imaging: No results found.  Labs:  CBC:  Recent Labs  08/06/14 1441 02/12/15 1442  WBC 4.6 3.8*  HGB 11.4* 11.2*  HCT 33.8* 33.5*  PLT 312.0 316.0    COAGS: No results for input(s):  INR, APTT in the last 8760 hours.  BMP:  Recent Labs  08/06/14 1441 02/12/15 1442  NA 132* 136  K 3.8 4.2  CL 101 104  CO2 23 24  GLUCOSE 96 93  BUN 17 25*  CALCIUM 9.4 9.7  CREATININE 0.77 1.11    LIVER FUNCTION TESTS:  Recent Labs  08/06/14 1441 02/12/15 1442  BILITOT 0.4 0.3  AST 16 17  ALT 15 17  ALKPHOS 56 59  PROT 6.7 7.0  ALBUMIN 4.6 4.6    Assessment and Plan: Hx of previously treated intracranial AVM Headaches For diagnostic cerebral arteriogram today Labs pending Risks and Benefits  discussed with the patient including, but not limited to bleeding, infection, vascular injury or contrast induced renal failure. All of the patient's questions were answered, patient is agreeable to proceed. Consent signed and in chart.   Thank you for this interesting consult.  I greatly enjoyed meeting Adrienne Robinson and look forward to participating in their care.  A copy of this report was sent to the requesting provider on this date.  Electronically Signed: Ascencion Dike 03/04/2015, 7:52 AM   I spent a total of 20 Minutes in face to face in clinical consultation, greater than 50% of which was counseling/coordinating care for cerebral arteriogram

## 2015-03-04 NOTE — Telephone Encounter (Signed)
Pt called back returning your call. Call pt @ [phone]. Thank You!

## 2015-03-04 NOTE — Telephone Encounter (Signed)
Pt called back to check on the status of her mychart message please advise pt @ [phone]

## 2015-03-04 NOTE — Sedation Documentation (Addendum)
CO2 tubing removed for imaging will continue to monitor O2 sats

## 2015-03-04 NOTE — Discharge Instructions (Signed)
Angiogram, Care After °Refer to this sheet in the next few weeks. These instructions provide you with information about caring for yourself after your procedure. Your health care provider may also give you more specific instructions. Your treatment has been planned according to current medical practices, but problems sometimes occur. Call your health care provider if you have any problems or questions after your procedure. °WHAT TO EXPECT AFTER THE PROCEDURE °After your procedure, it is typical to have the following: °· Bruising at the catheter insertion site that usually fades within 1-2 weeks. °· Blood collecting in the tissue (hematoma) that may be painful to the touch. It should usually decrease in size and tenderness within 1-2 weeks. °HOME CARE INSTRUCTIONS °· Take medicines only as directed by your health care provider. °· You may shower 24-48 hours after the procedure or as directed by your health care provider. Remove the bandage (dressing) and gently wash the site with plain soap and water. Pat the area dry with a clean towel. Do not rub the site, because this may cause bleeding. °· Do not take baths, swim, or use a hot tub until your health care provider approves. °· Check your insertion site every day for redness, swelling, or drainage. °· Do not apply powder or lotion to the site. °· Do not lift over 10 lb (4.5 kg) for 5 days after your procedure or as directed by your health care provider. °· Ask your health care provider when it is okay to: °¨ Return to work or school. °¨ Resume usual physical activities or sports. °¨ Resume sexual activity. °· Do not drive home if you are discharged the same day as the procedure. Have someone else drive you. °· You may drive 24 hours after the procedure unless otherwise instructed by your health care provider. °· Do not operate machinery or power tools for 24 hours after the procedure or as directed by your health care provider. °· If your procedure was done as an  outpatient procedure, which means that you went home the same day as your procedure, a responsible adult should be with you for the first 24 hours after you arrive home. °· Keep all follow-up visits as directed by your health care provider. This is important. °SEEK MEDICAL CARE IF: °· You have a fever. °· You have chills. °· You have increased bleeding from the catheter insertion site. Hold pressure on the site and call 911. °SEEK IMMEDIATE MEDICAL CARE IF: °· You have unusual pain at the catheter insertion site. °· You have redness, warmth, or swelling at the catheter insertion site. °· You have drainage (other than a small amount of blood on the dressing) from the catheter insertion site. °· The catheter insertion site is bleeding, and the bleeding does not stop after 30 minutes of holding steady pressure on the site. °· The area near or just beyond the catheter insertion site becomes pale, cool, tingly, or numb. °  °This information is not intended to replace advice given to you by your health care provider. Make sure you discuss any questions you have with your health care provider. °  °Document Released: 08/25/2004 Document Revised: 02/27/2014 Document Reviewed: 07/10/2012 °Elsevier Interactive Patient Education ©2016 [employer] ° °

## 2015-03-05 NOTE — Telephone Encounter (Signed)
Provider responded in a mychart to patient regarding this.

## 2015-03-12 ENCOUNTER — Telehealth: Payer: Self-pay | Admitting: *Deleted

## 2015-03-12 ENCOUNTER — Encounter: Payer: Self-pay | Admitting: Internal Medicine

## 2015-03-12 NOTE — Telephone Encounter (Signed)
Patient requested a medication refill for prednisone  [facility] [address]

## 2015-03-12 NOTE — Telephone Encounter (Signed)
I have sent the mychart message to Dr. Derrel Nip already today, we will need to see how she responds to that message.

## 2015-03-12 NOTE — Telephone Encounter (Signed)
Adrienne Robinson this pt is requesting prednisone, does this need to go to the provider? There are a few notes sent to mychart. Please advise

## 2015-03-13 ENCOUNTER — Other Ambulatory Visit: Payer: Self-pay | Admitting: Internal Medicine

## 2015-03-13 MED ORDER — PREDNISONE 10 MG PO TABS: ORAL_TABLET | ORAL | Status: DC

## 2015-03-24 ENCOUNTER — Other Ambulatory Visit: Payer: Self-pay | Admitting: Neurology

## 2015-04-04 ENCOUNTER — Encounter: Payer: Self-pay | Admitting: Internal Medicine

## 2015-04-06 ENCOUNTER — Other Ambulatory Visit: Payer: Self-pay | Admitting: Internal Medicine

## 2015-04-06 MED ORDER — PREDNISONE 10 MG PO TABS: ORAL_TABLET | ORAL | Status: DC

## 2015-04-14 ENCOUNTER — Encounter: Payer: Self-pay | Admitting: Internal Medicine

## 2015-04-14 MED ORDER — PREDNISONE 10 MG PO TABS: ORAL_TABLET | ORAL | Status: DC

## 2015-04-25 ENCOUNTER — Encounter: Payer: Self-pay | Admitting: Internal Medicine

## 2015-04-27 ENCOUNTER — Other Ambulatory Visit: Payer: Self-pay | Admitting: Internal Medicine

## 2015-04-27 MED ORDER — ALPRAZOLAM 0.5 MG PO TABS: 0.5000 mg | ORAL_TABLET | Freq: Two times a day (BID) | ORAL | Status: DC | PRN

## 2015-04-27 NOTE — Progress Notes (Unsigned)
Called into pharmacy as requested 

## 2015-04-28 ENCOUNTER — Other Ambulatory Visit: Payer: Self-pay | Admitting: Neurology

## 2015-05-05 ENCOUNTER — Encounter: Payer: Self-pay | Admitting: Internal Medicine

## 2015-05-05 ENCOUNTER — Ambulatory Visit (INDEPENDENT_AMBULATORY_CARE_PROVIDER_SITE_OTHER): Payer: 59 | Admitting: Internal Medicine

## 2015-05-05 VITALS — BP 102/64 | HR 81 | Temp 98.0°F | Resp 12 | Ht 67.0 in | Wt 128.0 lb

## 2015-05-05 DIAGNOSIS — K5903 Drug induced constipation: Secondary | ICD-10-CM

## 2015-05-05 DIAGNOSIS — Z8719 Personal history of other diseases of the digestive system: Secondary | ICD-10-CM | POA: Diagnosis not present

## 2015-05-05 DIAGNOSIS — T402X5A Adverse effect of other opioids, initial encounter: Secondary | ICD-10-CM

## 2015-05-05 DIAGNOSIS — M5090 Cervical disc disorder, unspecified, unspecified cervical region: Secondary | ICD-10-CM

## 2015-05-05 DIAGNOSIS — Z1239 Encounter for other screening for malignant neoplasm of breast: Secondary | ICD-10-CM

## 2015-05-05 DIAGNOSIS — Z800 Family history of malignant neoplasm of digestive organs: Secondary | ICD-10-CM

## 2015-05-05 MED ORDER — HYDROCODONE-ACETAMINOPHEN 7.5-325 MG PO TABS: 1.0000 | ORAL_TABLET | ORAL | Status: DC | PRN

## 2015-05-05 MED ORDER — AZELASTINE-FLUTICASONE 137-50 MCG/ACT NA SUSP: 1.0000 | Freq: Two times a day (BID) | NASAL | Status: DC

## 2015-05-05 MED ORDER — FLUTICASONE PROPIONATE 50 MCG/ACT NA SUSP: NASAL | Status: DC

## 2015-05-05 MED ORDER — ACYCLOVIR 400 MG PO TABS: 400.0000 mg | ORAL_TABLET | Freq: Every day | ORAL | Status: DC

## 2015-05-05 MED ORDER — LUBIPROSTONE 24 MCG PO CAPS: 24.0000 ug | ORAL_CAPSULE | Freq: Two times a day (BID) | ORAL | Status: DC

## 2015-05-05 NOTE — Patient Instructions (Signed)
TRIAL OF AMITIZA TWCIE DAILY INSTEAD OF LINZESS  You do not have thrush on exam. Let's treat the ulcers with a week of acyclovir

## 2015-05-05 NOTE — Progress Notes (Signed)
Subjective:  Patient ID: Adrienne Robinson, female    DOB: [birth year]  Age: 60 y.o. MRN: [id number]  CC: The primary encounter diagnosis was Breast cancer screening. Diagnoses of History of oral aphthous ulcers, Constipation due to opioid therapy, Cervical neck pain with evidence of disc disease, and Family history of colon cancer were also pertinent to this visit.  HPI LAILEE BUTTOLPH presents for 6 month follow up on chronic issues including chronic back and neck pain.   Anxiety:  Her 13 yr odl dog hs developed a brain tumor and she has been unable to accept his impending mortality.    Hypertension.  Taking meds as prescribed.   AVM: arteriogram jan 2017 by [name], 1 small parietal AVM noted 3 x 3 mm    Right ear pain coming from neck muscles  Spasming, from DDD cervical and thoracic spine  pain secondary to spinal stenosis,  DJD and fibromyalgia  Had acupuncture and TENS on lower back by Birdie Sons for lower back pain  Which really helped recover from the injury .   Has been having recurrent Mouth ulcers on her buccal mucosa and inside lips. allergies acting up   Colon 2014,  Due again  This year due to [place]  Constipation: she is using linzess but  not working as well as it used to,  Having a stool every 2-3 days   Wants to try something different.  No prior trial of amitiza    Outpatient Prescriptions Prior to Visit  Medication Sig Dispense Refill  . ALPRAZolam (XANAX) 0.5 MG tablet Take 1 tablet (0.5 mg total) by mouth 2 (two) times daily as needed for sleep or anxiety. 60 tablet 3  . amLODipine (NORVASC) 10 MG tablet TAKE 1 TABLET (10 MG TOTAL) BY MOUTH DAILY. 90 tablet 3  . Ascorbic Acid (VITAMIN C) 1000 MG tablet Take 1,000 mg by mouth daily.    Marland Kitchen aspirin EC 81 MG tablet Take 81 mg by mouth daily.    Marland Kitchen buPROPion (WELLBUTRIN XL) 300 MG 24 hr tablet Take 1 tablet (300 mg total) by mouth daily. 90 tablet 0  . glucosamine-chondroitin 500-400 MG tablet Take 1 tablet by mouth daily.     . Linaclotide (LINZESS) 290 MCG CAPS capsule Take 1 capsule (290 mcg total) by mouth daily. 90 capsule 5  . lisinopril (PRINIVIL,ZESTRIL) 5 MG tablet TAKE 1 TABLET BY MOUTH DAILY. 90 tablet 1  . meloxicam (MOBIC) 7.5 MG tablet TAKE 1 TABLET (7.5 MG TOTAL) BY MOUTH DAILY. 30 tablet 0  . Multiple Vitamins-Minerals (MULTIVITAMIN WITH MINERALS) tablet Take 1 tablet by mouth daily.    Marland Kitchen zolpidem (AMBIEN) 10 MG tablet Take 1 tablet (10 mg total) by mouth at bedtime as needed. for sleep 31 tablet 5  . HYDROcodone-acetaminophen (NORCO) 7.5-325 MG tablet Take 1 tablet by mouth every 4 (four) hours as needed for moderate pain. May refill on or after March 19 2015 180 tablet 0  . predniSONE (DELTASONE) 10 MG tablet 6 tablets daily for 3 days,  then reduce by 1 tablet daily until gone (Patient not taking: Reported on 05/05/2015) 33 tablet 0  . fluticasone (FLONASE) 50 MCG/ACT nasal spray USE 2 SPRAYS IN EACH NOSTRIL DAILY AS NEEDED 16 g 5   No facility-administered medications prior to visit.    Review of Systems;  Patient denies headache, fevers, malaise, unintentional weight loss, skin rash, eye pain, sinus congestion and sinus pain, sore throat, dysphagia,  hemoptysis , cough, dyspnea, wheezing, chest pain,  palpitations, orthopnea, edema, abdominal pain, nausea, melena, diarrhea, constipation, flank pain, dysuria, hematuria, urinary  Frequency, nocturia, numbness, tingling, seizures,  Focal weakness, Loss of consciousness,  Tremor, insomnia, depression, anxiety, and suicidal ideation.      Objective:  BP 102/64 mmHg  Pulse 81  Temp(Src) 98 F (36.7 C) (Oral)  Resp 12  Ht 5\' 7"  (1.702 m)  Wt 128 lb (58.06 kg)  BMI 20.04 kg/m2  SpO2 95%  BP Readings from Last 3 Encounters:  05/05/15 102/64  03/04/15 105/57  02/12/15 114/70    Wt Readings from Last 3 Encounters:  05/05/15 128 lb (58.06 kg)  03/04/15 125 lb (56.7 kg)  02/12/15 128 lb 2 oz (58.117 kg)    General appearance: alert,  cooperative and appears stated age Ears: normal TM's and external ear canals both ears Throat: lips, mucosa, and tongue normal; teeth and gums normal Neck: no adenopathy, no carotid bruit, supple, symmetrical, trachea midline and thyroid not enlarged, symmetric, no tenderness/mass/nodules Back: symmetric, no curvature. ROM normal. No CVA tenderness. Lungs: clear to auscultation bilaterally Heart: regular rate and rhythm, S1, S2 normal, no murmur, click, rub or gallop Abdomen: soft, non-tender; bowel sounds normal; no masses,  no organomegaly Pulses: 2+ and symmetric Skin: Skin color, texture, turgor normal. No rashes or lesions Lymph nodes: Cervical, supraclavicular, and axillary nodes normal.  Lab Results  Component Value Date   HGBA1C 5.7 08/04/2011    Lab Results  Component Value Date   CREATININE 1.05* 03/04/2015   CREATININE 1.11 02/12/2015   CREATININE 0.77 08/06/2014    Lab Results  Component Value Date   WBC 4.8 03/04/2015   HGB 10.9* 03/04/2015   HCT 32.1* 03/04/2015   PLT 291 03/04/2015   GLUCOSE 83 03/04/2015   CHOL 216* 08/06/2014   TRIG 101.0 08/06/2014   HDL 63.10 08/06/2014   LDLDIRECT 160.2 04/11/2013   LDLCALC 133* 08/06/2014   ALT 17 02/12/2015   AST 17 02/12/2015   NA 137 03/04/2015   K 4.2 03/04/2015   CL 103 03/04/2015   CREATININE 1.05* 03/04/2015   BUN 26* 03/04/2015   CO2 25 03/04/2015   TSH 0.72 08/06/2014   INR 0.94 03/04/2015   HGBA1C 5.7 08/04/2011   MICROALBUR 1.6 11/01/2011    Ir Angio Intra Extracran Sel Com Carotid Innominate Bilat Mod Sed  03/08/2015  CLINICAL DATA:  Occasional headaches. 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 EXAM: IR ANGIO VERTEBRAL SEL VERTEBRAL UNI LEFT MOD SED. BILATERAL COMMON CAROTID ARTERIOGRAMS, AND BILATERAL, VERTEBRAL ARTERY ANGIOGRAMS: ANESTHESIA/SEDATION: Conscious sedation. MEDICATIONS: Versed 2 mg IV.  Fentanyl 62.5 mcg IV. CONTRAST:  Omnipaque 300 approximately 35  mL. PROCEDURE: Following full explanation of the procedure along with the potential associated complications, an informed witnessed consent was obtained. The right groin was prepped and draped in the usual sterile fashion. Thereafter using modified Seldinger technique, transfemoral access into the right common femoral artery was obtained without difficulty. Over a 0.035 inch guidewire a 5 French Pinnacle sheath was inserted. Through this, and also over a 0.035 inch guidewire, a 5 French JB1 catheter was advanced to the aortic arch region and selectively positioned in the right common carotid artery, the right subclavian artery, the left common carotid artery and the left subclavian artery. The patient tolerated the procedure well. COMPLICATIONS: None immediate. FINDINGS: The right common carotid arteriogram demonstrates the right external carotid artery and its major branches to be normal. The right internal carotid artery at the bulb to the  cranial skull base opacifies normally. The petrous, cavernous and the supraclinoid segments are widely patent. A right posterior communicating artery is seen opacifying the right posterior cerebral artery distribution The right middle cerebral artery is seen to opacify normally into the capillary and the venous phases. The right anterior cerebral artery A1 segment is normal. There is mild prominence of the right anterior cerebral A2 and A3 segments with the distal pericallosal artery branches projecting via a small arterial feeder inferiorly into a small nidus measuring approximately 3.2 x 3.1 mm which leads to a single cortical vein projecting into the posterior 1/3 of the superior sagittal sinus. No prenidal or perinidal aneurysms are seen The right vertebral artery origin is normal. The vessel is seen to opacify normally to the cranial skull base. Wide patency is noted of the right vertebrobasilar junction. The left common carotid arteriogram demonstrates the left external  carotid artery and its major branches to be widely patent. The left internal carotid artery at the bulb to the cranial skull base opacifies normally. The petrous, the cavernous and the supraclinoid segments are widely patent. Left posterior communicating artery is seen opacifying the left posterior cerebral artery distribution. The left middle and the left anterior cerebral arteries opacify normally into the capillary and the venous phases. The left vertebral artery origin is normal. The vessel is seen to opacify to the cranial skull base. There is normal opacification of the left posterior-inferior cerebellar artery and the left vertebrobasilar junction. The opacified portion of the basilar artery, the superior cerebellar arteries and the anterior-inferior cerebellar arteries is grossly normal into the delayed arterial phase. 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 No perinidal or intranidal aneurysms seen. Compared to the previous arteriogram of 2014 there is significant decrease in the hemodynamic flow, and also of the nidus of the arteriovenous malformation. The angiographic findings were reviewed with the patient and the patient's husband. Discussion regarding options of further management were reviewed. This entailed continued conservative follow-up with MRI/MRA of the brain, versus consideration of consultation regarding Gamma Ray treatment. The option of further endovascular treatment with liquid embolic was discussed and the attendant potential complications. The patient has decided to seek consultation regarding gamma knife treatment. This will be initiated shortly. In the meantime patient will undergo a follow-up MRI/MRA of the brain with and without infusion in six months time. Electronically Signed   By: Luanne Bras M.D.   On: 03/08/2015 09:21   Ir Angio Vertebral Sel Vertebral Uni L Mod Sed  03/08/2015  CLINICAL DATA:  Occasional headaches. 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 EXAM: IR ANGIO VERTEBRAL SEL VERTEBRAL UNI LEFT MOD SED. BILATERAL COMMON CAROTID ARTERIOGRAMS, AND BILATERAL, VERTEBRAL ARTERY ANGIOGRAMS: ANESTHESIA/SEDATION: Conscious sedation. MEDICATIONS: Versed 2 mg IV.  Fentanyl 62.5 mcg IV. CONTRAST:  Omnipaque 300 approximately 35 mL. PROCEDURE: Following full explanation of the procedure along with the potential associated complications, an informed witnessed consent was obtained. The right groin was prepped and draped in the usual sterile fashion. Thereafter using modified Seldinger technique, transfemoral access into the right common femoral artery was obtained without difficulty. Over a 0.035 inch guidewire a 5 French Pinnacle sheath was inserted. Through this, and also over a 0.035 inch guidewire, a 5 French JB1 catheter was advanced to the aortic arch region and selectively positioned in  the right common carotid artery, the right subclavian artery, the left common carotid artery and the left subclavian artery. The patient tolerated the procedure well. COMPLICATIONS: None immediate. FINDINGS: The right common carotid arteriogram demonstrates the right external carotid artery and its major branches to be normal. The right internal carotid artery at the bulb to the cranial skull base opacifies normally. The petrous, cavernous and the supraclinoid segments are widely patent. A right posterior communicating artery is seen opacifying the right posterior cerebral artery distribution The right middle cerebral artery is seen to opacify normally into the capillary and the venous phases. The right anterior cerebral artery A1 segment is normal. There is mild prominence of the right anterior cerebral A2 and A3 segments with the distal pericallosal artery branches  projecting via a small arterial feeder inferiorly into a small nidus measuring approximately 3.2 x 3.1 mm which leads to a single cortical vein projecting into the posterior 1/3 of the superior sagittal sinus. No prenidal or perinidal aneurysms are seen The right vertebral artery origin is normal. The vessel is seen to opacify normally to the cranial skull base. Wide patency is noted of the right vertebrobasilar junction. The left common carotid arteriogram demonstrates the left external carotid artery and its major branches to be widely patent. The left internal carotid artery at the bulb to the cranial skull base opacifies normally. The petrous, the cavernous and the supraclinoid segments are widely patent. Left posterior communicating artery is seen opacifying the left posterior cerebral artery distribution. The left middle and the left anterior cerebral arteries opacify normally into the capillary and the venous phases. The left vertebral artery origin is normal. The vessel is seen to opacify to the cranial skull base. There is normal opacification of the left posterior-inferior cerebellar artery and the left vertebrobasilar junction. The opacified portion of the basilar artery, the superior cerebellar arteries and the anterior-inferior cerebellar arteries is grossly normal into the delayed arterial phase. 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 No perinidal or intranidal aneurysms seen. Compared to the previous arteriogram of 2014 there is significant decrease in the hemodynamic flow, and also of the nidus of the arteriovenous malformation. The angiographic findings were reviewed with the patient and the patient's husband. Discussion regarding options of further management were reviewed. This entailed continued conservative  follow-up with MRI/MRA of the brain, versus consideration of consultation regarding Gamma Ray treatment. The option of further endovascular treatment with liquid embolic was discussed and the attendant potential complications. The patient has decided to seek consultation regarding gamma knife treatment. This will be initiated shortly. In the meantime patient will undergo a follow-up MRI/MRA of the brain with and without infusion in six months time. Electronically Signed   By: Luanne Bras M.D.   On: 03/08/2015 09:21    Assessment & Plan:   Problem List Items Addressed This Visit    Family history of colon cancer    She is due this year fir 3 year follow up.  Referral made      Relevant Orders   Ambulatory referral to Gastroenterology   History of oral aphthous ulcers    Trail of steroid gel and magic mouthwash      Constipation due to opioid therapy    Previously effectively treated with Linzess.  Trial of Amitiza.       Cervical  neck pain with evidence of disc disease    Continue NSAID and prn vicodin.       Relevant Medications   HYDROcodone-acetaminophen (NORCO) 7.5-325 MG tablet    Other Visit Diagnoses    Breast cancer screening    -  Primary    Relevant Orders    MM DIGITAL SCREENING BILATERAL     A total of 25 minutes of face to face time was spent with patient more than half of which was spent in counselling about the above mentioned conditions  and coordination of care   I have discontinued Ms. Mathia's fluticasone, HYDROcodone-acetaminophen, fluticasone, and HYDROcodone-acetaminophen. I have also changed her HYDROcodone-acetaminophen. Additionally, I am having her start on Azelastine-Fluticasone, acyclovir, and lubiprostone. Lastly, I am having her maintain her amLODipine, Linaclotide, buPROPion, multivitamin with minerals, glucosamine-chondroitin, vitamin C, zolpidem, lisinopril, aspirin EC, meloxicam, predniSONE, and ALPRAZolam.  Meds ordered this encounter    Medications  . DISCONTD: fluticasone (FLONASE) 50 MCG/ACT nasal spray    Sig: USE 2 SPRAYS IN EACH NOSTRIL DAILY AS NEEDED    Dispense:  16 g    Refill:  5  . Azelastine-Fluticasone 137-50 MCG/ACT SUSP    Sig: Place 1 spray into the nose 2 (two) times daily.    Dispense:  23 g    Refill:  5  . acyclovir (ZOVIRAX) 400 MG tablet    Sig: Take 1 tablet (400 mg total) by mouth 5 (five) times daily.    Dispense:  35 tablet    Refill:  0  . lubiprostone (AMITIZA) 24 MCG capsule    Sig: Take 1 capsule (24 mcg total) by mouth 2 (two) times daily with a meal.    Dispense:  60 capsule    Refill:  0  . DISCONTD: HYDROcodone-acetaminophen (NORCO) 7.5-325 MG tablet    Sig: Take 1 tablet by mouth every 4 (four) hours as needed for moderate pain.    Dispense:  180 tablet    Refill:  0  . DISCONTD: HYDROcodone-acetaminophen (NORCO) 7.5-325 MG tablet    Sig: Take 1 tablet by mouth every 4 (four) hours as needed for moderate pain. May refill on or after June 05 2015    Dispense:  180 tablet    Refill:  0  . HYDROcodone-acetaminophen (NORCO) 7.5-325 MG tablet    Sig: Take 1 tablet by mouth every 4 (four) hours as needed for moderate pain. May refill on or after MAYl  15 2017    Dispense:  180 tablet    Refill:  0    Medications Discontinued During This Encounter  Medication Reason  . fluticasone (FLONASE) 50 MCG/ACT nasal spray Reorder  . fluticasone (FLONASE) 50 MCG/ACT nasal spray   . HYDROcodone-acetaminophen (NORCO) 7.5-325 MG tablet Reorder  . HYDROcodone-acetaminophen (NORCO) 7.5-325 MG tablet Reorder  . HYDROcodone-acetaminophen (NORCO) 7.5-325 MG tablet Reorder    Follow-up: No Follow-up on file.   [name], MD

## 2015-05-05 NOTE — Progress Notes (Signed)
Pre-visit discussion using our clinic review tool. No additional management support is needed unless otherwise documented below in the visit note.  

## 2015-05-06 ENCOUNTER — Other Ambulatory Visit: Payer: Self-pay | Admitting: Internal Medicine

## 2015-05-06 DIAGNOSIS — Z8719 Personal history of other diseases of the digestive system: Secondary | ICD-10-CM

## 2015-05-06 MED ORDER — TRIAMCINOLONE ACETONIDE 0.1 % MT PSTE: 1.0000 "application " | PASTE | Freq: Two times a day (BID) | OROMUCOSAL | Status: DC

## 2015-05-06 MED ORDER — MAGIC MOUTHWASH W/LIDOCAINE: ORAL | Status: DC

## 2015-05-06 NOTE — Telephone Encounter (Signed)
Magic mouthwash has been refilled, it may provide symptomatic relief so it;'s worth a try ..  I was referring to Nystatin as being not appropriate.    TT

## 2015-05-06 NOTE — Telephone Encounter (Signed)
Pt is requesting magic mouthwash, that she spoke with Dr. Derrel Nip yesterday. Please advise, thanks

## 2015-05-07 MED ORDER — MAGIC MOUTHWASH W/LIDOCAINE: ORAL | Status: DC

## 2015-05-07 NOTE — Addendum Note (Signed)
Addended by: Vergia Alcon D on: 05/07/2015 03:27 PM   Modules accepted: Orders

## 2015-05-07 NOTE — Telephone Encounter (Signed)
A new script has been ordered the last one was for only 2 teaspoons

## 2015-05-08 ENCOUNTER — Other Ambulatory Visit: Payer: Self-pay | Admitting: Neurology

## 2015-05-08 DIAGNOSIS — M5090 Cervical disc disorder, unspecified, unspecified cervical region: Secondary | ICD-10-CM | POA: Insufficient documentation

## 2015-05-08 DIAGNOSIS — M47812 Spondylosis without myelopathy or radiculopathy, cervical region: Secondary | ICD-10-CM | POA: Insufficient documentation

## 2015-05-08 DIAGNOSIS — K5903 Drug induced constipation: Secondary | ICD-10-CM | POA: Insufficient documentation

## 2015-05-08 DIAGNOSIS — T402X5A Adverse effect of other opioids, initial encounter: Secondary | ICD-10-CM | POA: Insufficient documentation

## 2015-05-08 NOTE — Assessment & Plan Note (Signed)
She is due this year fir 3 year follow up.  Referral made

## 2015-05-08 NOTE — Assessment & Plan Note (Signed)
Previously effectively treated with Linzess.  Trial of Amitiza.

## 2015-05-08 NOTE — Assessment & Plan Note (Signed)
Trail of steroid gel and magic mouthwash

## 2015-05-08 NOTE — Assessment & Plan Note (Signed)
Continue NSAID and prn vicodin.

## 2015-05-23 ENCOUNTER — Other Ambulatory Visit: Payer: Self-pay | Admitting: Internal Medicine

## 2015-05-25 ENCOUNTER — Other Ambulatory Visit: Payer: Self-pay | Admitting: Internal Medicine

## 2015-05-26 ENCOUNTER — Encounter: Payer: Self-pay | Admitting: Internal Medicine

## 2015-05-27 ENCOUNTER — Other Ambulatory Visit: Payer: Self-pay | Admitting: Internal Medicine

## 2015-05-27 MED ORDER — LACTULOSE 20 GM/30ML PO SOLN: ORAL | Status: DC

## 2015-05-28 ENCOUNTER — Telehealth: Payer: Self-pay | Admitting: Internal Medicine

## 2015-05-28 NOTE — Telephone Encounter (Signed)
Sent my chart message with last colonoscopy suggested by [facility] to repeat in 5 years.

## 2015-06-01 ENCOUNTER — Ambulatory Visit: Payer: 59 | Admitting: Internal Medicine

## 2015-06-02 ENCOUNTER — Encounter: Payer: Self-pay | Admitting: Internal Medicine

## 2015-06-03 ENCOUNTER — Ambulatory Visit (INDEPENDENT_AMBULATORY_CARE_PROVIDER_SITE_OTHER)
Admission: RE | Admit: 2015-06-03 | Discharge: 2015-06-03 | Disposition: A | Discharge status: Home / Self Care | Payer: 59 | Source: Ambulatory Visit | Attending: Internal Medicine | Admitting: Internal Medicine

## 2015-06-03 ENCOUNTER — Telehealth: Payer: Self-pay | Admitting: *Deleted

## 2015-06-03 ENCOUNTER — Encounter: Payer: Self-pay | Admitting: Internal Medicine

## 2015-06-03 ENCOUNTER — Other Ambulatory Visit: Payer: Self-pay | Admitting: Internal Medicine

## 2015-06-03 DIAGNOSIS — K567 Ileus, unspecified: Secondary | ICD-10-CM

## 2015-06-03 DIAGNOSIS — T50905A Adverse effect of unspecified drugs, medicaments and biological substances, initial encounter: Secondary | ICD-10-CM

## 2015-06-03 DIAGNOSIS — K5909 Other constipation: Secondary | ICD-10-CM | POA: Diagnosis not present

## 2015-06-03 NOTE — Telephone Encounter (Signed)
Adrienne Robinson  I sent Adrienne Robinson an e mail. because of the late hour.  If you are still in the office, you can call her and read it to her. I recommended that she consider going to the ER if her pain is severe OR IF SHE has nausea or vomiting . If not ,  An ileus takes time to resolve on its own and nothing can really hasten its resolution   If she can minimize her use of narcotics that may help.  Ramonita Lab,   Deborra Medina, MD

## 2015-06-03 NOTE — Telephone Encounter (Signed)
Patient requested to have her Xray results from 06/03/15 Pt contact

## 2015-06-03 NOTE — Telephone Encounter (Signed)
Just a FYI when patient is seen on Monday 6 p.m.

## 2015-06-03 NOTE — Telephone Encounter (Signed)
Please review and advise, thanks.

## 2015-06-04 ENCOUNTER — Other Ambulatory Visit: Payer: Self-pay | Admitting: Internal Medicine

## 2015-06-04 MED ORDER — LACTULOSE 20 GM/30ML PO SOLN: ORAL | Status: DC

## 2015-06-07 ENCOUNTER — Ambulatory Visit (INDEPENDENT_AMBULATORY_CARE_PROVIDER_SITE_OTHER): Payer: 59 | Admitting: Internal Medicine

## 2015-06-07 ENCOUNTER — Encounter: Payer: Self-pay | Admitting: Internal Medicine

## 2015-06-07 ENCOUNTER — Ambulatory Visit (INDEPENDENT_AMBULATORY_CARE_PROVIDER_SITE_OTHER)
Admission: RE | Admit: 2015-06-07 | Discharge: 2015-06-07 | Disposition: A | Discharge status: Home / Self Care | Payer: 59 | Source: Ambulatory Visit | Attending: Internal Medicine | Admitting: Internal Medicine

## 2015-06-07 VITALS — BP 110/68 | HR 71 | Temp 97.5°F | Resp 14 | Ht 67.0 in | Wt 124.8 lb

## 2015-06-07 DIAGNOSIS — K567 Ileus, unspecified: Secondary | ICD-10-CM | POA: Diagnosis not present

## 2015-06-07 DIAGNOSIS — T50905A Adverse effect of unspecified drugs, medicaments and biological substances, initial encounter: Secondary | ICD-10-CM

## 2015-06-07 MED ORDER — CELECOXIB 200 MG PO CAPS: 200.0000 mg | ORAL_CAPSULE | Freq: Two times a day (BID) | ORAL | Status: DC

## 2015-06-07 MED ORDER — TRAMADOL HCL 50 MG PO TABS: 50.0000 mg | ORAL_TABLET | Freq: Four times a day (QID) | ORAL | Status: DC | PRN

## 2015-06-07 NOTE — Progress Notes (Signed)
Subjective:  Patient ID: Adrienne Robinson, female    DOB: 1956-02-21  Age: 60 y.o. MRN: [id number]  CC: The encounter diagnosis was Ileus, unspecified ([place]).  HPI Adrienne Robinson presents for follow up on recent onset of abdominal distension and new onset constipation. Symptoms started last week with constipation which she treated with multiple doses of lactulose with no significant relief . She denied fevers,  Hematochezia nad severe abdominal pain.  She  Was sent for  abdominal films last thursday and the results were inconclusive but concerning for ileus vs early SBO.  She was not nauseated or in any pain at the time, so conservative treatment over the weekend was advised and followed.   She stopped using lactulose and simplified her diet.  Sh eis heare today for evaluation and has had repeat films this morning.  She reports that over the weekend she had very little appetite,  But denies nausea and vomiting. Has been taking Gas X and phasyme.  She felt a "change" in her mid abdominal region yesterday morning, and had a soft stool same day (Sunday).  repeat films today show minimal air fluid levels and official report is resolved ileus. Gaseous distension of transverse colon noted.    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 Her only change in regimen lately was increased travel with back to back flights every 3 days for the 3 weeks preceeding the onset of ileus.    She is very concerned about having a colonoscopy sooner that the recommended 5 year interval given her very strong FH of colon CA in father and grandfather.   Outpatient Prescriptions Prior to Visit  Medication Sig Dispense Refill  . amLODipine (NORVASC) 10 MG tablet TAKE 1 TABLET (10 MG TOTAL) BY MOUTH DAILY. 90 tablet 3  . Ascorbic Acid (VITAMIN C) 1000 MG tablet Take 1,000 mg by mouth daily.    Marland Kitchen aspirin EC 81 MG  tablet Take 81 mg by mouth daily.    . Azelastine-Fluticasone 137-50 MCG/ACT SUSP Place 1 spray into the nose 2 (two) times daily. 23 g 5  . buPROPion (WELLBUTRIN XL) 300 MG 24 hr tablet TAKE 1 TABLET (300 MG TOTAL) BY MOUTH DAILY. 90 tablet 1  . glucosamine-chondroitin 500-400 MG tablet Take 1 tablet by mouth daily.    Marland Kitchen HYDROcodone-acetaminophen (NORCO) 7.5-325 MG tablet Take 1 tablet by mouth every 4 (four) hours as needed for moderate pain. May refill on or after MAYl  15 2017 180 tablet 0  . Lactulose 20 GM/30ML SOLN 30 ml every 4 hours until constipation is relieved 236 mL 3  . lisinopril (PRINIVIL,ZESTRIL) 5 MG tablet TAKE 1 TABLET BY MOUTH DAILY. 90 tablet 1  . lubiprostone (AMITIZA) 24 MCG capsule Take 1 capsule (24 mcg total) by mouth 2 (two) times daily with a meal. 60 capsule 0  . meloxicam (MOBIC) 7.5 MG tablet TAKE 1 TABLET (7.5 MG TOTAL) BY MOUTH DAILY. 30 tablet 0  . Multiple Vitamins-Minerals (MULTIVITAMIN WITH MINERALS) tablet Take 1 tablet by mouth daily.    Marland Kitchen zolpidem (AMBIEN) 10 MG tablet Take 1 tablet (10 mg total) by mouth at bedtime as needed. for sleep 31 tablet 5  . magic mouthwash w/lidocaine SOLN Use as directed 240 mL 1  . predniSONE (DELTASONE) 10 MG tablet 6 tablets daily for 3 days,  then reduce by 1 tablet daily until gone 33 tablet  0  . triamcinolone (KENALOG) 0.1 % paste Use as directed 1 application in the mouth or throat 2 (two) times daily. 5 g 12  . acyclovir (ZOVIRAX) 400 MG tablet Take 1 tablet (400 mg total) by mouth 5 (five) times daily. (Patient not taking: Reported on 06/07/2015) 35 tablet 0  . buPROPion (WELLBUTRIN XL) 300 MG 24 hr tablet Take 1 tablet (300 mg total) by mouth daily. (Patient not taking: Reported on 06/07/2015) 90 tablet 0  . Linaclotide (LINZESS) 290 MCG CAPS capsule Take 1 capsule (290 mcg total) by mouth daily. (Patient not taking: Reported on 06/07/2015) 90 capsule 5   No facility-administered medications prior to visit.    Review of  Systems;  Patient denies headache, fevers, malaise, unintentional weight loss, skin rash, eye pain, sinus congestion and sinus pain, sore throat, dysphagia,  hemoptysis , cough, dyspnea, wheezing, chest pain, palpitations, orthopnea, edema, abdominal pain, nausea, melena, diarrhea, constipation, flank pain, dysuria, hematuria, urinary  Frequency, nocturia, numbness, tingling, seizures,  Focal weakness, Loss of consciousness,  Tremor, insomnia, depression, anxiety, and suicidal ideation.      Objective:  BP 110/68 mmHg  Pulse 71  Temp(Src) 97.5 F (36.4 C) (Oral)  Resp 14  Ht 5\' 7"  (1.702 m)  Wt 124 lb 12.8 oz (56.609 kg)  BMI 19.54 kg/m2  SpO2 98%  BP Readings from Last 3 Encounters:  06/07/15 110/68  05/05/15 102/64  03/04/15 105/57    Wt Readings from Last 3 Encounters:  06/07/15 124 lb 12.8 oz (56.609 kg)  05/05/15 128 lb (58.06 kg)  03/04/15 125 lb (56.7 kg)    General appearance: alert, cooperative and appears stated age Ears: normal TM's and external ear canals both ears Throat: lips, mucosa, and tongue normal; teeth and gums normal Neck: no adenopathy, no carotid bruit, supple, symmetrical, trachea midline and thyroid not enlarged, symmetric, no tenderness/mass/nodules Back: symmetric, no curvature. ROM normal. No CVA tenderness. Lungs: clear to auscultation bilaterally Heart: regular rate and rhythm, S1, S2 normal, no murmur, click, rub or gallop Abdomen: soft, distended with gas. non-tender; bowel sounds normal; no masses,  no organomegaly Pulses: 2+ and symmetric Skin: Skin color, texture, turgor normal. No rashes or lesions Lymph nodes: Cervical, supraclavicular, and axillary nodes normal.  Lab Results  Component Value Date   HGBA1C 5.7 08/04/2011    Lab Results  Component Value Date   CREATININE 1.05* 03/04/2015   CREATININE 1.11 02/12/2015   CREATININE 0.77 08/06/2014    Lab Results  Component Value Date   WBC 4.8 03/04/2015   HGB 10.9* 03/04/2015     HCT 32.1* 03/04/2015   PLT 291 03/04/2015   GLUCOSE 83 03/04/2015   CHOL 216* 08/06/2014   TRIG 101.0 08/06/2014   HDL 63.10 08/06/2014   LDLDIRECT 160.2 04/11/2013   LDLCALC 133* 08/06/2014   ALT 17 02/12/2015   AST 17 02/12/2015   NA 137 03/04/2015   K 4.2 03/04/2015   CL 103 03/04/2015   CREATININE 1.05* 03/04/2015   BUN 26* 03/04/2015   CO2 25 03/04/2015   TSH 0.72 08/06/2014   INR 0.94 03/04/2015   HGBA1C 5.7 08/04/2011   MICROALBUR 1.6 11/01/2011    Dg Abd 2 Views  06/07/2015  CLINICAL DATA:  History of ileus.  Subsequent encounter. EXAM: ABDOMEN - 2 VIEW COMPARISON:  Two views of the abdomen in 06/03/2015. FINDINGS: No free intraperitoneal air is identified. The bowel gas pattern is nonobstructive. No evidence of ileus is seen. Mild gaseous distention of the transverse  colon is noted. Convex right scoliosis is identified. IMPRESSION: No acute abnormality.  Negative for ileus. Electronically Signed   By: Inge Rise M.D.   On: 06/07/2015 10:51    Assessment & Plan:   Problem List Items Addressed This Visit    Ileus, unspecified ([place]) - Primary    Now resolved.  Discussed causes including travel schedule and daily use of narcotics for management of spinal stenosis and DJD shoulder.  trila of celebrex and tramadol advised and suspension of vicodin ..checking electrolytes today.   Follow  Up with GI per patient preference,  To call Dr Candace Cruise herself.        Relevant Orders   Comprehensive metabolic panel   Magnesium     A total of 40 minutes was spent with patient more than half of which was spent in counseling patient on the above mentioned issues , reviewing and explaining recent labs and imaging studies done, and coordination of care.  I have discontinued Ms. Web's predniSONE, triamcinolone, and magic mouthwash w/lidocaine. I am also having her start on celecoxib and traMADol. Additionally, I am having her maintain her linaclotide, buPROPion, multivitamin with  minerals, glucosamine-chondroitin, vitamin C, zolpidem, lisinopril, aspirin EC, meloxicam, Azelastine-Fluticasone, acyclovir, lubiprostone, HYDROcodone-acetaminophen, amLODipine, buPROPion, and Lactulose.  Meds ordered this encounter  Medications  . celecoxib (CELEBREX) 200 MG capsule    Sig: Take 1 capsule (200 mg total) by mouth 2 (two) times daily.    Dispense:  60 capsule    Refill:  2  . traMADol (ULTRAM) 50 MG tablet    Sig: Take 1 tablet (50 mg total) by mouth every 6 (six) hours as needed.    Dispense:  30 tablet    Refill:  0    Medications Discontinued During This Encounter  Medication Reason  . magic mouthwash w/lidocaine SOLN Error  . predniSONE (DELTASONE) 10 MG tablet Error  . triamcinolone (KENALOG) 0.1 % paste Error    Follow-up: No Follow-up on file.   [name], MD

## 2015-06-07 NOTE — Progress Notes (Signed)
Pre visit review using our clinic review tool, if applicable. No additional management support is needed unless otherwise documented below in the visit note. 

## 2015-06-07 NOTE — Patient Instructions (Signed)
The ileus may resolve faster if we can suspend the vicodin.  I have prescribed celebrex 200 mg twice daily and tramadol 50 mg every 6 hours  To use instead for the next few days

## 2015-06-08 ENCOUNTER — Other Ambulatory Visit: Payer: Self-pay | Admitting: Neurology

## 2015-06-08 ENCOUNTER — Encounter: Payer: Self-pay | Admitting: Internal Medicine

## 2015-06-08 ENCOUNTER — Other Ambulatory Visit: Payer: Self-pay | Admitting: Internal Medicine

## 2015-06-08 LAB — COMPREHENSIVE METABOLIC PANEL
ALT: 18 U/L (ref 0–35)
AST: 18 U/L (ref 0–37)
Albumin: 4.5 g/dL (ref 3.5–5.2)
Alkaline Phosphatase: 62 U/L (ref 39–117)
BUN: 20 mg/dL (ref 6–23)
CO2: 25 mEq/L (ref 19–32)
Calcium: 9.7 mg/dL (ref 8.4–10.5)
Chloride: 101 mEq/L (ref 96–112)
Creatinine, Ser: 1.09 mg/dL (ref 0.40–1.20)
GFR: 54.53 mL/min — ABNORMAL LOW (ref >60.00)
Glucose, Bld: 89 mg/dL (ref 70–99)
Potassium: 3.4 mEq/L — ABNORMAL LOW (ref 3.5–5.1)
Sodium: 134 mEq/L — ABNORMAL LOW (ref 135–145)
Total Bilirubin: 0.4 mg/dL (ref 0.2–1.2)
Total Protein: 6.5 g/dL (ref 6.0–8.3)

## 2015-06-08 LAB — MAGNESIUM: Magnesium: 1.9 mg/dL (ref 1.5–2.5)

## 2015-06-08 MED ORDER — POTASSIUM CHLORIDE CRYS ER 20 MEQ PO TBCR: 20.0000 meq | EXTENDED_RELEASE_TABLET | Freq: Every day | ORAL | Status: DC

## 2015-06-08 NOTE — Assessment & Plan Note (Addendum)
Now resolved.  Discussed causes including travel schedule and daily use of narcotics for management of spinal stenosis and DJD shoulder.  trila of celebrex and tramadol advised and suspension of vicodin ..checking electrolytes today.   Follow  Up with GI per patient preference,  To call Dr Candace Cruise herself.

## 2015-06-19 ENCOUNTER — Encounter: Payer: Self-pay | Admitting: Internal Medicine

## 2015-06-22 ENCOUNTER — Other Ambulatory Visit: Payer: Self-pay | Admitting: Internal Medicine

## 2015-06-26 ENCOUNTER — Telehealth: Payer: Self-pay | Admitting: Internal Medicine

## 2015-06-26 MED ORDER — TRAMADOL HCL 50 MG PO TABS: 50.0000 mg | ORAL_TABLET | Freq: Four times a day (QID) | ORAL | Status: DC | PRN

## 2015-06-26 MED ORDER — MELOXICAM 7.5 MG PO TABS: ORAL_TABLET | ORAL | Status: DC

## 2015-06-26 NOTE — Telephone Encounter (Signed)
Can you please cancel her May 12th appt per mychart communication. Please send the tramadol rx to her pharmacy again. They do not have it. I printed it.

## 2015-06-28 NOTE — Telephone Encounter (Signed)
Canceled patient appointment and faxed script to pharmacy.

## 2015-06-29 MED ORDER — MELOXICAM 15 MG PO TABS: 15.0000 mg | ORAL_TABLET | Freq: Every day | ORAL | Status: DC

## 2015-07-02 ENCOUNTER — Ambulatory Visit: Payer: 59 | Admitting: Internal Medicine

## 2015-07-08 ENCOUNTER — Encounter: Payer: Self-pay | Admitting: Internal Medicine

## 2015-07-08 NOTE — Telephone Encounter (Signed)
FYI

## 2015-07-12 ENCOUNTER — Other Ambulatory Visit: Payer: Self-pay | Admitting: Internal Medicine

## 2015-08-02 ENCOUNTER — Encounter: Payer: Self-pay | Admitting: Internal Medicine

## 2015-08-02 NOTE — Telephone Encounter (Signed)
Last fill for Hydrocodone 07/05/15 last visit 4/17

## 2015-08-04 ENCOUNTER — Telehealth: Payer: Self-pay | Admitting: Internal Medicine

## 2015-08-04 MED ORDER — HYDROCODONE-ACETAMINOPHEN 7.5-325 MG PO TABS: 1.0000 | ORAL_TABLET | ORAL | Status: DC | PRN

## 2015-08-04 NOTE — Telephone Encounter (Signed)
Scripts placed at front desk.

## 2015-08-04 NOTE — Telephone Encounter (Signed)
Pt is out running irons and would like to come and pick up her prescription that Dr. Derrel Nip said that would be ready this after noon. Pt will pick up around 3:30.

## 2015-08-26 ENCOUNTER — Other Ambulatory Visit: Payer: Self-pay | Admitting: *Deleted

## 2015-08-26 MED ORDER — LISINOPRIL 5 MG PO TABS: 5.0000 mg | ORAL_TABLET | Freq: Every day | ORAL | Status: DC

## 2015-08-26 NOTE — Progress Notes (Signed)
Refill sent on lisinopril

## 2015-08-30 ENCOUNTER — Other Ambulatory Visit: Payer: Self-pay

## 2015-08-30 MED ORDER — LUBIPROSTONE 24 MCG PO CAPS: ORAL_CAPSULE | ORAL | Status: DC

## 2015-09-05 ENCOUNTER — Other Ambulatory Visit: Payer: Self-pay | Admitting: Internal Medicine

## 2015-09-06 NOTE — Telephone Encounter (Signed)
Last OV 4/17 ok to fill?

## 2015-09-06 NOTE — Telephone Encounter (Signed)
refilled 

## 2015-09-14 ENCOUNTER — Encounter: Payer: Self-pay | Admitting: Internal Medicine

## 2015-09-22 ENCOUNTER — Encounter: Payer: Self-pay | Admitting: Internal Medicine

## 2015-09-22 MED ORDER — HYDROCODONE-ACETAMINOPHEN 7.5-325 MG PO TABS: 1.0000 | ORAL_TABLET | ORAL | 0 refills | Status: DC | PRN

## 2015-10-19 ENCOUNTER — Other Ambulatory Visit: Payer: Self-pay | Admitting: Internal Medicine

## 2015-10-27 ENCOUNTER — Encounter: Payer: Self-pay | Admitting: Internal Medicine

## 2015-10-27 DIAGNOSIS — Q282 Arteriovenous malformation of cerebral vessels: Secondary | ICD-10-CM

## 2015-10-28 MED ORDER — HYDROCODONE-ACETAMINOPHEN 7.5-325 MG PO TABS: 1.0000 | ORAL_TABLET | ORAL | 0 refills | Status: DC | PRN

## 2015-10-28 NOTE — Telephone Encounter (Signed)
See mychart message about [facility] referral

## 2015-10-29 NOTE — Telephone Encounter (Signed)
Pt called to follow up on her Rx. Please advise? ° °

## 2015-11-09 ENCOUNTER — Encounter: Payer: Self-pay | Admitting: Internal Medicine

## 2015-11-09 ENCOUNTER — Ambulatory Visit: Payer: 59 | Admitting: Internal Medicine

## 2015-11-09 ENCOUNTER — Ambulatory Visit (INDEPENDENT_AMBULATORY_CARE_PROVIDER_SITE_OTHER): Payer: 59 | Admitting: Internal Medicine

## 2015-11-09 ENCOUNTER — Telehealth: Payer: Self-pay | Admitting: *Deleted

## 2015-11-09 VITALS — Ht 67.0 in | Wt 123.2 lb

## 2015-11-09 DIAGNOSIS — M48061 Spinal stenosis, lumbar region without neurogenic claudication: Secondary | ICD-10-CM

## 2015-11-09 DIAGNOSIS — M5090 Cervical disc disorder, unspecified, unspecified cervical region: Secondary | ICD-10-CM

## 2015-11-09 DIAGNOSIS — I10 Essential (primary) hypertension: Secondary | ICD-10-CM

## 2015-11-09 DIAGNOSIS — M4806 Spinal stenosis, lumbar region: Secondary | ICD-10-CM

## 2015-11-09 DIAGNOSIS — M130 Polyarthritis, unspecified: Secondary | ICD-10-CM

## 2015-11-09 DIAGNOSIS — M064 Inflammatory polyarthropathy: Secondary | ICD-10-CM

## 2015-11-09 DIAGNOSIS — M48062 Spinal stenosis, lumbar region with neurogenic claudication: Secondary | ICD-10-CM

## 2015-11-09 DIAGNOSIS — M5010 Cervical disc disorder with radiculopathy, unspecified cervical region: Secondary | ICD-10-CM

## 2015-11-09 DIAGNOSIS — Z23 Encounter for immunization: Secondary | ICD-10-CM

## 2015-11-09 DIAGNOSIS — G894 Chronic pain syndrome: Secondary | ICD-10-CM

## 2015-11-09 MED ORDER — MONTELUKAST SODIUM 10 MG PO TABS: 10.0000 mg | ORAL_TABLET | Freq: Every day | ORAL | 3 refills | Status: DC

## 2015-11-09 MED ORDER — HYDROCODONE-ACETAMINOPHEN 7.5-325 MG PO TABS: 1.0000 | ORAL_TABLET | ORAL | 0 refills | Status: DC | PRN

## 2015-11-09 NOTE — Progress Notes (Signed)
Subjective:  Patient ID: Adrienne Robinson, female    DOB: 10-22-1955  Age: 61 y.o. MRN: [id number]  CC: The primary encounter diagnosis was Cervical disc disorder with radiculopathy of cervical region. Diagnoses of Encounter for immunization, Cervical neck pain with evidence of disc disease, Polyarthritis of multiple sites [facility]), Essential hypertension, Chronic pain syndrome, and Spinal stenosis of lumbar region were also pertinent to this visit.  HPI Adrienne Robinson presents for follow up on multiple issues including chronic pain syndrome secondary to DJD of multiple joints and spinal stenosis .  managed with 6 Vicodin 7.5/325 daily  and15 mg  Meloxicam.  Has been having constant back pain an now left sided neck pain radiating to right ear.  Aggravated by flying,  Sitting for prolonged periods.    Needs the open MRI to be done  in [place] .  Tried tramadol  Gave her an upset stomach,    Doing Yoga,  Tai Chi.   Seeing Tomi Likens, Neurology  in November.  Saw [name] for follow up on  persistent AVM . Considering referral to [facility]  UC aggravated by travel.  pepto bismol and H2 blocker.   helps .  Family History of colon CA at age 16  Last seen in April  Refill history confirmed via [place] Controlled Substance databas, accessed by me today..  ,  With prior shoulder replacement   Left great toe had an acute inflammatory reaction ,  treated it for gout. Lasted 3.5 days . Used naproxen . Was still taking meloxicam.   Father had gout.      Outpatient Medications Prior to Visit  Medication Sig Dispense Refill  . amLODipine (NORVASC) 10 MG tablet TAKE 1 TABLET (10 MG TOTAL) BY MOUTH DAILY. 90 tablet 3  . Ascorbic Acid (VITAMIN C) 1000 MG tablet Take 1,000 mg by mouth daily.    Marland Kitchen aspirin EC 81 MG tablet Take 81 mg by mouth daily.    . Azelastine-Fluticasone 137-50 MCG/ACT SUSP Place 1 spray into the nose 2 (two) times daily. 23 g 5  . buPROPion (WELLBUTRIN XL) 300 MG 24 hr tablet TAKE 1 TABLET (300  MG TOTAL) BY MOUTH DAILY. 90 tablet 1  . glucosamine-chondroitin 500-400 MG tablet Take 1 tablet by mouth daily.    Marland Kitchen lisinopril (PRINIVIL,ZESTRIL) 5 MG tablet Take 1 tablet (5 mg total) by mouth daily. 90 tablet 1  . lubiprostone (AMITIZA) 24 MCG capsule TAKE 1 CAPSULE (24 MCG TOTAL) BY MOUTH 2 (TWO) TIMES DAILY WITH A MEAL. 180 capsule 1  . meloxicam (MOBIC) 15 MG tablet Take 1 tablet (15 mg total) by mouth daily. 90 tablet 1  . Multiple Vitamins-Minerals (MULTIVITAMIN WITH MINERALS) tablet Take 1 tablet by mouth daily.    Marland Kitchen zolpidem (AMBIEN) 10 MG tablet TAKE 1 TABLET BY MOUTH AT BEDTIME AS NEEDED SLEEP 30 tablet 4  . acyclovir (ZOVIRAX) 400 MG tablet Take 1 tablet (400 mg total) by mouth 5 (five) times daily. 35 tablet 0  . buPROPion (WELLBUTRIN XL) 300 MG 24 hr tablet Take 1 tablet (300 mg total) by mouth daily. 90 tablet 0  . celecoxib (CELEBREX) 200 MG capsule Take 1 capsule (200 mg total) by mouth 2 (two) times daily. 60 capsule 2  . HYDROcodone-acetaminophen (NORCO) 7.5-325 MG tablet Take 1 tablet by mouth every 4 (four) hours as needed for moderate pain. May refill on or after Sept  15 2017 180 tablet 0  . Lactulose 20 GM/30ML SOLN 30 ml every  4 hours until constipation is relieved 236 mL 3  . Linaclotide (LINZESS) 290 MCG CAPS capsule Take 1 capsule (290 mcg total) by mouth daily. 90 capsule 5  . potassium chloride SA (K-DUR,KLOR-CON) 20 MEQ tablet Take 1 tablet (20 mEq total) by mouth daily. For 3 days 3 tablet 0  . traMADol (ULTRAM) 50 MG tablet Take 1 tablet (50 mg total) by mouth every 6 (six) hours as needed. 120 tablet 5   No facility-administered medications prior to visit.     Review of Systems;  Patient denies headache, fevers, malaise, unintentional weight loss, skin rash, eye pain, sinus congestion and sinus pain, sore throat, dysphagia,  hemoptysis , cough, dyspnea, wheezing, chest pain, palpitations, orthopnea, edema, abdominal pain, nausea, melena, diarrhea,  constipation, flank pain, dysuria, hematuria, urinary  Frequency, nocturia, numbness, tingling, seizures,  Focal weakness, Loss of consciousness,  Tremor, insomnia, depression, anxiety, and suicidal ideation.      Objective:  Ht '5\' 7"'  (1.702 m)   Wt 123 lb 4 oz (55.9 kg)   BMI 19.30 kg/m   BP Readings from Last 3 Encounters:  06/07/15 110/68  05/05/15 102/64  03/04/15 (!) 105/57    Wt Readings from Last 3 Encounters:  11/09/15 123 lb 4 oz (55.9 kg)  06/07/15 124 lb 12.8 oz (56.6 kg)  05/05/15 128 lb (58.1 kg)    General appearance: alert, cooperative and appears stated age Ears: normal TM's and external ear canals both ears Throat: lips, mucosa, and tongue normal; teeth and gums normal Neck: no adenopathy, no carotid bruit, supple, symmetrical, trachea midline and thyroid not enlarged, symmetric, no tenderness/mass/nodules Back: symmetric, no curvature. ROM normal. No CVA tenderness. Lungs: clear to auscultation bilaterally Heart: regular rate and rhythm, S1, S2 normal, no murmur, click, rub or gallop Abdomen: soft, non-tender; bowel sounds normal; no masses,  no organomegaly Pulses: 2+ and symmetric Skin: Skin color, texture, turgor normal. No rashes or lesions Lymph nodes: Cervical, supraclavicular, and axillary nodes normal.  Lab Results  Component Value Date   HGBA1C 5.7 08/04/2011    Lab Results  Component Value Date   CREATININE 1.09 06/07/2015   CREATININE 1.05 (H) 03/04/2015   CREATININE 1.11 02/12/2015    Lab Results  Component Value Date   WBC 4.8 03/04/2015   HGB 10.9 (L) 03/04/2015   HCT 32.1 (L) 03/04/2015   PLT 291 03/04/2015   GLUCOSE 89 06/07/2015   CHOL 216 (H) 08/06/2014   TRIG 101.0 08/06/2014   HDL 63.10 08/06/2014   LDLDIRECT 160.2 04/11/2013   LDLCALC 133 (H) 08/06/2014   ALT 18 06/07/2015   AST 18 06/07/2015   NA 134 (L) 06/07/2015   K 3.4 (L) 06/07/2015   CL 101 06/07/2015   CREATININE 1.09 06/07/2015   BUN 20 06/07/2015   CO2  25 06/07/2015   TSH 0.72 08/06/2014   INR 0.94 03/04/2015   HGBA1C 5.7 08/04/2011   MICROALBUR 1.6 11/01/2011    Dg Abd 2 Views  Result Date: 06/07/2015 CLINICAL DATA:  History of ileus.  Subsequent encounter. EXAM: ABDOMEN - 2 VIEW COMPARISON:  Two views of the abdomen in 06/03/2015. FINDINGS: No free intraperitoneal air is identified. The bowel gas pattern is nonobstructive. No evidence of ileus is seen. Mild gaseous distention of the transverse colon is noted. Convex right scoliosis is identified. IMPRESSION: No acute abnormality.  Negative for ileus. Electronically Signed   By: Inge Rise M.D.   On: 06/07/2015 10:51    Assessment & Plan:   Problem  List Items Addressed This Visit    Hypertension    Well controlled on current regimen. Renal function stable, no changes today.  Lab Results  Component Value Date   CREATININE 1.09 06/07/2015   Lab Results  Component Value Date   NA 134 (L) 06/07/2015   K 3.4 (L) 06/07/2015   CL 101 06/07/2015   CO2 25 06/07/2015         Spinal stenosis of lumbar region    Complicated by scoliosis. She is unable to sit for prolonged periods of time without increased pain.  She uses a standing desk at home. Her pain has become more persistent and she is requesting a repeat  MRI lumbar spine       Polyarthritis of multiple sites [facility])    Recent flare of joint pain presumed to be due to gout.        Cervical neck pain with evidence of disc disease    Managed with daily  NSAID and vicodin every 6 hours. . Repeat MRI ordered to investigate worsening neck pain now with radiculopathy       Relevant Medications   HYDROcodone-acetaminophen (NORCO) 7.5-325 MG tablet   chlorzoxazone (PARAFON) 500 MG tablet   Chronic pain syndrome    Refill history confirmed via [place] Controlled Substance databas, accessed by me today..REfills for 3 months given        Other Visit Diagnoses    Cervical disc disorder with radiculopathy of cervical region    -   Primary   Relevant Orders   MR Cervical Spine Wo Contrast   Encounter for immunization       Relevant Medications   montelukast (SINGULAIR) 10 MG tablet   Other Relevant Orders   Flu Vaccine QUAD 36+ mos IM (Completed)     A total of 25 minutes of face to face time was spent with patient more than half of which was spent in counselling about the above mentioned conditions  and coordination of care  I have discontinued Ms. Maul's linaclotide, acyclovir, Lactulose, celecoxib, potassium chloride SA, traMADol, HYDROcodone-acetaminophen, and HYDROcodone-acetaminophen. I have also changed her HYDROcodone-acetaminophen. Additionally, I am having her start on montelukast. Lastly, I am having her maintain her multivitamin with minerals, glucosamine-chondroitin, vitamin C, aspirin EC, Azelastine-Fluticasone, amLODipine, buPROPion, meloxicam, lisinopril, lubiprostone, zolpidem, and chlorzoxazone.  Meds ordered this encounter  Medications  . montelukast (SINGULAIR) 10 MG tablet    Sig: Take 1 tablet (10 mg total) by mouth at bedtime.    Dispense:  30 tablet    Refill:  3  . DISCONTD: HYDROcodone-acetaminophen (NORCO) 7.5-325 MG tablet    Sig: Take 1 tablet by mouth every 4 (four) hours as needed for moderate pain. May refill on or after Dec 05 2015    Dispense:  180 tablet    Refill:  0  . DISCONTD: HYDROcodone-acetaminophen (NORCO) 7.5-325 MG tablet    Sig: Take 1 tablet by mouth every 4 (four) hours as needed for moderate pain. May refill on or after Jan 05 2016    Dispense:  180 tablet    Refill:  0  . HYDROcodone-acetaminophen (NORCO) 7.5-325 MG tablet    Sig: Take 1 tablet by mouth every 4 (four) hours as needed for moderate pain. May refill on or after February 04 2016    Dispense:  180 tablet    Refill:  0  . chlorzoxazone (PARAFON) 500 MG tablet    Sig: Take 750 mg by mouth 3 (three) times  daily as needed for muscle spasms.    Medications Discontinued During This Encounter    Medication Reason  . acyclovir (ZOVIRAX) 400 MG tablet Patient Preference  . buPROPion (WELLBUTRIN XL) 300 MG 24 hr tablet Duplicate  . celecoxib (CELEBREX) 200 MG capsule Patient Preference  . Lactulose 20 GM/30ML SOLN Patient Preference  . Linaclotide (LINZESS) 290 MCG CAPS capsule Patient Preference  . potassium chloride SA (K-DUR,KLOR-CON) 20 MEQ tablet Patient Preference  . traMADol (ULTRAM) 50 MG tablet Patient Preference  . HYDROcodone-acetaminophen (NORCO) 7.5-325 MG tablet Reorder  . HYDROcodone-acetaminophen (NORCO) 7.5-325 MG tablet Reorder  . HYDROcodone-acetaminophen (NORCO) 7.5-325 MG tablet Reorder    Follow-up: No Follow-up on file.   [name], MD

## 2015-11-09 NOTE — Telephone Encounter (Signed)
After OV was over pt asked if you would change her MRI from just cervical to her whole back,  Thoracic and lumbar included.  Patient requested to contact her via mychart.

## 2015-11-09 NOTE — Progress Notes (Signed)
Pre visit review using our clinic review tool, if applicable. No additional management support is needed unless otherwise documented below in the visit note. 

## 2015-11-09 NOTE — Patient Instructions (Signed)
I have ordered an MRI of the cervical spine  To be done piror to your appt with Dr  Tomi Likens  Your next gout flre can be treated with a prednisone taper   See you in 3 months,  We can do labs same day or before

## 2015-11-11 DIAGNOSIS — G894 Chronic pain syndrome: Secondary | ICD-10-CM | POA: Insufficient documentation

## 2015-11-11 NOTE — Assessment & Plan Note (Signed)
Refill history confirmed via [place] Controlled Substance databas, accessed by me today..REfills for 3 months given

## 2015-11-11 NOTE — Assessment & Plan Note (Signed)
Recent flare of joint pain presumed to be due to gout.

## 2015-11-11 NOTE — Assessment & Plan Note (Signed)
Managed with daily  NSAID and vicodin every 6 hours. . Repeat MRI ordered to investigate worsening neck pain now with radiculopathy

## 2015-11-11 NOTE — Assessment & Plan Note (Signed)
Complicated by scoliosis. She is unable to sit for prolonged periods of time without increased pain.  She uses a standing desk at home. Her pain has become more persistent and she is requesting a repeat  MRI lumbar spine

## 2015-11-11 NOTE — Assessment & Plan Note (Signed)
Well controlled on current regimen. Renal function stable, no changes today.  Lab Results  Component Value Date   CREATININE 1.09 06/07/2015   Lab Results  Component Value Date   NA 134 (L) 06/07/2015   K 3.4 (L) 06/07/2015   CL 101 06/07/2015   CO2 25 06/07/2015

## 2015-11-16 ENCOUNTER — Ambulatory Visit: Payer: 59 | Admitting: Neurology

## 2015-11-17 MED ORDER — PREDNISONE 10 MG PO TABS: ORAL_TABLET | ORAL | 0 refills | Status: DC

## 2015-11-29 ENCOUNTER — Encounter: Payer: Self-pay | Admitting: Internal Medicine

## 2015-11-30 MED ORDER — PREDNISONE 10 MG PO TABS: ORAL_TABLET | ORAL | 0 refills | Status: DC

## 2015-12-01 ENCOUNTER — Other Ambulatory Visit: Payer: Self-pay | Admitting: Internal Medicine

## 2015-12-01 NOTE — Telephone Encounter (Signed)
Xanax is not on patient's current medication list. Looks like last time xanax was sent in was on 08/19/13. Was not discussed at [place] on 11/09/15.

## 2015-12-01 NOTE — Telephone Encounter (Signed)
Refill denied.  Concurrent use of alprazolam and narcotics is no longer advised due to risk of overdose.

## 2015-12-02 ENCOUNTER — Telehealth (HOSPITAL_COMMUNITY): Payer: Self-pay

## 2015-12-02 NOTE — Telephone Encounter (Signed)
Called to schedule, left messge for pt to return call. AW

## 2015-12-14 ENCOUNTER — Other Ambulatory Visit: Payer: Self-pay | Admitting: Internal Medicine

## 2015-12-14 DIAGNOSIS — J302 Other seasonal allergic rhinitis: Secondary | ICD-10-CM

## 2015-12-14 MED ORDER — MONTELUKAST SODIUM 10 MG PO TABS: 10.0000 mg | ORAL_TABLET | Freq: Every day | ORAL | 3 refills | Status: DC

## 2015-12-14 NOTE — Telephone Encounter (Signed)
Refilled Montelukast.

## 2015-12-26 ENCOUNTER — Encounter: Payer: Self-pay | Admitting: Internal Medicine

## 2015-12-27 ENCOUNTER — Other Ambulatory Visit: Payer: Self-pay | Admitting: Internal Medicine

## 2015-12-28 ENCOUNTER — Other Ambulatory Visit: Payer: Self-pay | Admitting: Internal Medicine

## 2016-01-07 ENCOUNTER — Telehealth: Payer: Self-pay | Admitting: Neurology

## 2016-01-07 NOTE — Telephone Encounter (Signed)
Adrienne Robinson 2056/02/15. She was calling to verify her appointment with Dr. Tomi Likens on 01/17/16 and she wanted to make sure you all had her records from Dr. Erling Cruz sent to you. Her # is [phone]. Thank you

## 2016-01-10 NOTE — Telephone Encounter (Signed)
Mychart message sent w/ response  Double checked for notes. Have not received.

## 2016-01-11 ENCOUNTER — Ambulatory Visit (INDEPENDENT_AMBULATORY_CARE_PROVIDER_SITE_OTHER): Payer: 59 | Admitting: Internal Medicine

## 2016-01-11 ENCOUNTER — Encounter: Payer: Self-pay | Admitting: Internal Medicine

## 2016-01-11 VITALS — BP 110/70 | HR 68 | Temp 97.8°F | Resp 12 | Ht 67.0 in | Wt 124.2 lb

## 2016-01-11 DIAGNOSIS — Z79899 Other long term (current) drug therapy: Secondary | ICD-10-CM

## 2016-01-11 DIAGNOSIS — Z8739 Personal history of other diseases of the musculoskeletal system and connective tissue: Secondary | ICD-10-CM

## 2016-01-11 DIAGNOSIS — G894 Chronic pain syndrome: Secondary | ICD-10-CM | POA: Diagnosis not present

## 2016-01-11 MED ORDER — HYDROCODONE-ACETAMINOPHEN 7.5-325 MG PO TABS: 1.0000 | ORAL_TABLET | ORAL | 0 refills | Status: DC | PRN

## 2016-01-11 NOTE — Patient Instructions (Signed)
Ok to try judicious quantities of scallops,  To se if gout returns  We can try colchicine for the next gout flare.  Prednisone is not first choice because of recurrence (like you had)

## 2016-01-11 NOTE — Progress Notes (Signed)
Pre-visit discussion using our clinic review tool. No additional management support is needed unless otherwise documented below in the visit note.  

## 2016-01-11 NOTE — Progress Notes (Signed)
Subjective:  Patient ID: Adrienne Robinson, female    DOB: 05/21/1955  Age: 60 y.o. MRN: [id number]  CC: The primary encounter diagnosis was History of gout. Diagnoses of Long-term use of high-risk medication and Chronic pain syndrome were also pertinent to this visit.  HPI Adrienne Robinson presents for follow up on follow up on chronic conditions including cervical disk disease,   DJD shoulder,  And recurrent attacks of presumed gout . 2nd episode involving the left great toe, did not resolve immediatley with the prednisone dose and she requested a second round, which I refused to fill. Since then her pain has resolved. Dhe has changed her diet by eliminating red meat and scallops. She reports that she has reduced her alcohol consumption to wine only 2-3 nights per week.  .   Using meloxicam.   has a new puppy 78 weeks old.  Got him 4 days ago.  Has not been sleeping well due to the puppy's diffficutly adjusting to life without his mother .  Turned 60,  Has accepted a position which  will involve less travelling and hopefully less stress on her back, shoulders and neck.  .  She is still using 6 vicodin daily , pain is aggravated by travel.  Trying a copper T shirt which she feels is helping her posture .     Outpatient Medications Prior to Visit  Medication Sig Dispense Refill  . amLODipine (NORVASC) 10 MG tablet TAKE 1 TABLET (10 MG TOTAL) BY MOUTH DAILY. 90 tablet 3  . aspirin EC 81 MG tablet Take 81 mg by mouth daily.    . Azelastine-Fluticasone 137-50 MCG/ACT SUSP Place 1 spray into the nose 2 (two) times daily. 23 g 5  . buPROPion (WELLBUTRIN XL) 300 MG 24 hr tablet TAKE 1 TABLET (300 MG TOTAL) BY MOUTH DAILY. 90 tablet 1  . glucosamine-chondroitin 500-400 MG tablet Take 1 tablet by mouth daily.    Marland Kitchen lisinopril (PRINIVIL,ZESTRIL) 5 MG tablet Take 1 tablet (5 mg total) by mouth daily. 90 tablet 1  . lubiprostone (AMITIZA) 24 MCG capsule TAKE 1 CAPSULE (24 MCG TOTAL) BY MOUTH 2 (TWO) TIMES  DAILY WITH A MEAL. 180 capsule 1  . meloxicam (MOBIC) 15 MG tablet TAKE 1 TABLET (15 MG TOTAL) BY MOUTH DAILY. 90 tablet 1  . Multiple Vitamins-Minerals (MULTIVITAMIN WITH MINERALS) tablet Take 1 tablet by mouth daily.    Marland Kitchen zolpidem (AMBIEN) 10 MG tablet TAKE 1 TABLET BY MOUTH AT BEDTIME AS NEEDED SLEEP 30 tablet 4  . Ascorbic Acid (VITAMIN C) 1000 MG tablet Take 1,000 mg by mouth daily.    Marland Kitchen buPROPion (WELLBUTRIN XL) 300 MG 24 hr tablet TAKE 1 TABLET (300 MG TOTAL) BY MOUTH DAILY. 90 tablet 1  . chlorzoxazone (PARAFON) 500 MG tablet Take 750 mg by mouth 3 (three) times daily as needed for muscle spasms.    Marland Kitchen HYDROcodone-acetaminophen (NORCO) 7.5-325 MG tablet Take 1 tablet by mouth every 4 (four) hours as needed for moderate pain. May refill on or after February 04 2016 180 tablet 0  . montelukast (SINGULAIR) 10 MG tablet Take 1 tablet (10 mg total) by mouth at bedtime. 30 tablet 3  . predniSONE (DELTASONE) 10 MG tablet 6 tablets daily all at once on Day 1,  Then  Taper dose by 1 tablet daily until gone 21 tablet 0   No facility-administered medications prior to visit.     Review of Systems;  Patient denies headache, fevers, malaise, unintentional  weight loss, skin rash, eye pain, sinus congestion and sinus pain, sore throat, dysphagia,  hemoptysis , cough, dyspnea, wheezing, chest pain, palpitations, orthopnea, edema, abdominal pain, nausea, melena, diarrhea, constipation, flank pain, dysuria, hematuria, urinary  Frequency, nocturia, numbness, tingling, seizures,  Focal weakness, Loss of consciousness,  Tremor, insomnia, depression, anxiety, and suicidal ideation.      Objective:  BP 110/70   Pulse 68   Temp 97.8 F (36.6 C) (Oral)   Resp 12   Ht '5\' 7"'  (1.702 m)   Wt 124 lb 4 oz (56.4 kg)   SpO2 99%   BMI 19.46 kg/m   BP Readings from Last 3 Encounters:  01/11/16 110/70  06/07/15 110/68  05/05/15 102/64    Wt Readings from Last 3 Encounters:  01/11/16 124 lb 4 oz (56.4 kg)    11/09/15 123 lb 4 oz (55.9 kg)  06/07/15 124 lb 12.8 oz (56.6 kg)    General appearance: alert, cooperative and appears stated age Ears: normal TM's and external ear canals both ears Throat: lips, mucosa, and tongue normal; teeth and gums normal Neck: no adenopathy, no carotid bruit, supple, symmetrical, trachea midline and thyroid not enlarged, symmetric, no tenderness/mass/nodules Back: symmetric, no curvature. ROM normal. No CVA tenderness. Lungs: clear to auscultation bilaterally Heart: regular rate and rhythm, S1, S2 normal, no murmur, click, rub or gallop Abdomen: soft, non-tender; bowel sounds normal; no masses,  no organomegaly Pulses: 2+ and symmetric Skin: Skin color, texture, turgor normal. No rashes or lesions Lymph nodes: Cervical, supraclavicular, and axillary nodes normal.  Lab Results  Component Value Date   HGBA1C 5.7 08/04/2011    Lab Results  Component Value Date   CREATININE 1.19 01/11/2016   CREATININE 1.09 06/07/2015   CREATININE 1.05 (H) 03/04/2015    Lab Results  Component Value Date   WBC 4.8 03/04/2015   HGB 10.9 (L) 03/04/2015   HCT 32.1 (L) 03/04/2015   PLT 291 03/04/2015   GLUCOSE 91 01/11/2016   CHOL 216 (H) 08/06/2014   TRIG 101.0 08/06/2014   HDL 63.10 08/06/2014   LDLDIRECT 160.2 04/11/2013   LDLCALC 133 (H) 08/06/2014   ALT 16 01/11/2016   AST 14 01/11/2016   NA 134 (L) 01/11/2016   K 4.0 01/11/2016   CL 102 01/11/2016   CREATININE 1.19 01/11/2016   BUN 21 01/11/2016   CO2 22 01/11/2016   TSH 0.72 08/06/2014   INR 0.94 03/04/2015   HGBA1C 5.7 08/04/2011   MICROALBUR 1.6 11/01/2011    Dg Abd 2 Views  Result Date: 06/07/2015 CLINICAL DATA:  History of ileus.  Subsequent encounter. EXAM: ABDOMEN - 2 VIEW COMPARISON:  Two views of the abdomen in 06/03/2015. FINDINGS: No free intraperitoneal air is identified. The bowel gas pattern is nonobstructive. No evidence of ileus is seen. Mild gaseous distention of the transverse colon is  noted. Convex right scoliosis is identified. IMPRESSION: No acute abnormality.  Negative for ileus. Electronically Signed   By: Inge Rise M.D.   On: 06/07/2015 10:51    Assessment & Plan:   Problem List Items Addressed This Visit    Chronic pain syndrome    She has chronic back pain Complicated by scoliosis. She is unable to sit for prolonged periods of time without increased pain.  She uses a standing desk at home. She continues to require 6 vicodin daily to manage her pain .  Refill history confirmed via [place] Controlled Substance databas, accessed by me today..REfills for 3 months given e  History of gout - Primary    Involving the great toe on the right.   She has changed her diet and has had no recurrence.   Lab Results  Component Value Date   LABURIC 6.2 01/11/2016        Relevant Orders   Uric acid (Completed)    Other Visit Diagnoses    Long-term use of high-risk medication       Relevant Orders   Comp Met (CMET) (Completed)      I have discontinued Adrienne Robinson's vitamin C, HYDROcodone-acetaminophen, chlorzoxazone, predniSONE, and montelukast. I have also changed her HYDROcodone-acetaminophen. Additionally, I am having her maintain her multivitamin with minerals, glucosamine-chondroitin, aspirin EC, Azelastine-Fluticasone, amLODipine, lisinopril, lubiprostone, zolpidem, buPROPion, and meloxicam.  Meds ordered this encounter  Medications  . DISCONTD: HYDROcodone-acetaminophen (NORCO) 7.5-325 MG tablet    Sig: Take 1 tablet by mouth every 4 (four) hours as needed for moderate pain. May refill on or after March 06 2016    Dispense:  180 tablet    Refill:  0  . HYDROcodone-acetaminophen (NORCO) 7.5-325 MG tablet    Sig: Take 1 tablet by mouth every 4 (four) hours as needed for moderate pain. May refill on or after Apr 06 2016    Dispense:  180 tablet    Refill:  0    Medications Discontinued During This Encounter  Medication Reason  . Ascorbic Acid  (VITAMIN C) 1000 MG tablet Error  . predniSONE (DELTASONE) 10 MG tablet Patient Preference  . montelukast (SINGULAIR) 10 MG tablet Patient Preference  . chlorzoxazone (PARAFON) 500 MG tablet Patient Preference  . buPROPion (WELLBUTRIN XL) 300 MG 24 hr tablet Patient Preference  . HYDROcodone-acetaminophen (NORCO) 7.5-325 MG tablet Reorder  . HYDROcodone-acetaminophen (NORCO) 7.5-325 MG tablet Reorder    Follow-up: No Follow-up on file.   [name], MD

## 2016-01-12 LAB — COMPREHENSIVE METABOLIC PANEL
ALT: 16 U/L (ref 0–35)
AST: 14 U/L (ref 0–37)
Albumin: 4.6 g/dL (ref 3.5–5.2)
Alkaline Phosphatase: 51 U/L (ref 39–117)
BUN: 21 mg/dL (ref 6–23)
CO2: 22 mEq/L (ref 19–32)
Calcium: 9.4 mg/dL (ref 8.4–10.5)
Chloride: 102 mEq/L (ref 96–112)
Creatinine, Ser: 1.19 mg/dL (ref 0.40–1.20)
GFR: 49.18 mL/min — ABNORMAL LOW (ref >60.00)
Glucose, Bld: 91 mg/dL (ref 70–99)
Potassium: 4 mEq/L (ref 3.5–5.1)
Sodium: 134 mEq/L — ABNORMAL LOW (ref 135–145)
Total Bilirubin: 0.4 mg/dL (ref 0.2–1.2)
Total Protein: 6.9 g/dL (ref 6.0–8.3)

## 2016-01-12 LAB — URIC ACID: Uric Acid, Serum: 6.2 mg/dL (ref 2.4–7.0)

## 2016-01-13 DIAGNOSIS — Z8739 Personal history of other diseases of the musculoskeletal system and connective tissue: Secondary | ICD-10-CM | POA: Insufficient documentation

## 2016-01-13 NOTE — Assessment & Plan Note (Signed)
She has chronic back pain Complicated by scoliosis. She is unable to sit for prolonged periods of time without increased pain.  She uses a standing desk at home. She continues to require 6 vicodin daily to manage her pain .  Refill history confirmed via [place] Controlled Substance databas, accessed by me today..REfills for 3 months given e

## 2016-01-13 NOTE — Assessment & Plan Note (Signed)
Involving the great toe on the right.   She has changed her diet and has had no recurrence.   Lab Results  Component Value Date   LABURIC 6.2 01/11/2016

## 2016-01-16 ENCOUNTER — Encounter: Payer: Self-pay | Admitting: Internal Medicine

## 2016-01-16 ENCOUNTER — Other Ambulatory Visit: Payer: Self-pay | Admitting: Internal Medicine

## 2016-01-16 DIAGNOSIS — R944 Abnormal results of kidney function studies: Secondary | ICD-10-CM

## 2016-01-16 NOTE — Progress Notes (Signed)
Basic

## 2016-01-17 ENCOUNTER — Encounter: Payer: Self-pay | Admitting: Neurology

## 2016-01-17 ENCOUNTER — Ambulatory Visit (INDEPENDENT_AMBULATORY_CARE_PROVIDER_SITE_OTHER): Payer: 59 | Admitting: Neurology

## 2016-01-17 VITALS — BP 136/70 | HR 56 | Ht 67.0 in | Wt 123.0 lb

## 2016-01-17 DIAGNOSIS — G8929 Other chronic pain: Secondary | ICD-10-CM

## 2016-01-17 DIAGNOSIS — M546 Pain in thoracic spine: Secondary | ICD-10-CM

## 2016-01-17 DIAGNOSIS — Q282 Arteriovenous malformation of cerebral vessels: Secondary | ICD-10-CM | POA: Diagnosis not present

## 2016-01-17 NOTE — Patient Instructions (Signed)
1.  We will refer you to Dr. Hulan Saas for possible therapy 2.  Try taking the muscle relaxant.  Take it at home first to see how it affects you.  Do not drive while on it. 3.  Continue meloxicam 4.  Follow up in one year.

## 2016-01-17 NOTE — Progress Notes (Addendum)
NEUROLOGY CONSULTATION NOTE  Adrienne Robinson MRN: [id number] DOB: 1956-01-29  Referring provider: Dr. Derrel Nip Primary care provider: Dr. Derrel Nip  Reason for consult:  Back and neck pain.  Cerebral AVM  HISTORY OF PRESENT ILLNESS: Adrienne Robinson is a 60 year old right-handed woman with hypertension, chronic pain syndrome, lumbar spinal stenosis and intracranial AVM who presents for evaluation regarding back and neck pain, as well as cerebral AVM.  Back pain: She has history of chronic back pain.  Plain films of the spine from 10/03/11 were personally reviewed and revealed disc narrowing at C5-6 (with bilateral foraminal narrowing) and at C6-7.  The thoracic spine showed scoliosis with left convexity centered at T4-5.  Lumbar spine showed mild scoliosis with right upper lumbar convexity, as well as degenerative changes with advanced disc space narrowing at L4-L5 and slight disc space narrowing at L2-L3 and L3-L4.  She underwent right L4-5 interlaminar epidural injection in 2012, which was not effective.  However, she underwent right L3-L4 interlaminar epidural steroid injection in 2013, which was effective.  She was fine for a couple of years.  However, she has had return of back pain, which has been exacerbated due to prolonged traveling on planes for work.  She reports right sided paraspinal pain from the lumbar region up to the cervical region.  It is non-radiating.  She denies extremity weakness or numbness and tingling.  It is worse at the end of the day and after prolonged inactivity (such as first thing in morning after waking up and after long plane ride).  She uses both heat and cold, which help.  She does have arthritis and takes meloxicam daily.  She takes hydrocodone for pain.  She has tried muscle relaxants in the past, which cause sedation.  However, she does have Robaxin.  Yoga has not helped.  She had right shoulder surgery 3 years ago.  She has been evaluated by rheumatology, who diagnosed  her with fibromyalgia.  Cerebral AVM. In late 2013, she developed new-onset right sided headaches.   MRI of brain from 01/30/12 revealed an AVM involving the interhemispheric fissure and medial aspect of the right posterior parietal lobe.  underwent Onyx embolization for right parietal AVM in January 2014.  It is monitored by Dr. Estanislado Pandy without any further intervention.  Most recent MRI and MRA of the head were performed on 10/13/14, which revealed small residual right parietal AVM, stable compared to prior imaging from 09/12/13.  She underwent a cerebral arteriogram on 03/04/15 (which was personally reviewed) confirmed a small residual right parietal AVM with nidus measuring approximately 3.2 mm x 3.1 mm with single cortical draining vein.  She denies recurrent headaches or lateralizing neurologic signs or symptoms.  She notes that Dr. Estanislado Pandy is considering sending her to a specialist at an academic center for another opinion.  Labs from this month include CMP with Na 134, K 4, BUN 21, Cr 1.19, Total bili 0.4, ALP 51, AST 14 and ALT 16  PAST MEDICAL HISTORY: Past Medical History:  Diagnosis Date  . Anxiety   . 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 Back pain   . Degenerative joint disease involving multiple joints   . Depression   . Dizziness   . Fibromyalgia   . Headache(784.0)    having since  past month and a half  . Hypertension   . Pneumonia    approx 6 years ago  . Spinal stenosis of  lumbar region   . Ulcerative proctitis ([place])     PAST SURGICAL HISTORY: Past Surgical History:  Procedure Laterality Date  . ABDOMINAL HYSTERECTOMY    . APPENDECTOMY    . AV FISTULA REPAIR    . CARDIAC CATHETERIZATION     normal coronaries, no wall motion abnormalities 11/02/09 [facility])  . NASAL SINUS SURGERY    . OOPHORECTOMY    . RADIOLOGY WITH ANESTHESIA  02/26/2012   Procedure: RADIOLOGY WITH ANESTHESIA;  Surgeon: [name],  MD;  Location: [place];  Service: Radiology;  Laterality: N/A;  . REVERSE SHOULDER ARTHROPLASTY Right 06/27/2013   Procedure: REVERSE RIGHT TOTAL SHOULDER ARTHROPLASTY;  Surgeon: [name], MD;  Location: [place];  Service: Orthopedics;  Laterality: Right;  . SHOULDER ARTHROSCOPY WITH ROTATOR CUFF REPAIR AND SUBACROMIAL DECOMPRESSION  2007   left  . TONSILLECTOMY  adnoids    MEDICATIONS: Current Outpatient Prescriptions on File Prior to Visit  Medication Sig Dispense Refill  . amLODipine (NORVASC) 10 MG tablet TAKE 1 TABLET (10 MG TOTAL) BY MOUTH DAILY. 90 tablet 3  . aspirin EC 81 MG tablet Take 81 mg by mouth daily.    . Azelastine-Fluticasone 137-50 MCG/ACT SUSP Place 1 spray into the nose 2 (two) times daily. 23 g 5  . buPROPion (WELLBUTRIN XL) 300 MG 24 hr tablet TAKE 1 TABLET (300 MG TOTAL) BY MOUTH DAILY. 90 tablet 1  . glucosamine-chondroitin 500-400 MG tablet Take 1 tablet by mouth daily.    Marland Kitchen HYDROcodone-acetaminophen (NORCO) 7.5-325 MG tablet Take 1 tablet by mouth every 4 (four) hours as needed for moderate pain. May refill on or after Apr 06 2016 180 tablet 0  . lisinopril (PRINIVIL,ZESTRIL) 5 MG tablet Take 1 tablet (5 mg total) by mouth daily. 90 tablet 1  . lubiprostone (AMITIZA) 24 MCG capsule TAKE 1 CAPSULE (24 MCG TOTAL) BY MOUTH 2 (TWO) TIMES DAILY WITH A MEAL. 180 capsule 1  . meloxicam (MOBIC) 15 MG tablet TAKE 1 TABLET (15 MG TOTAL) BY MOUTH DAILY. 90 tablet 1  . Multiple Vitamins-Minerals (MULTIVITAMIN WITH MINERALS) tablet Take 1 tablet by mouth daily.    Marland Kitchen zolpidem (AMBIEN) 10 MG tablet TAKE 1 TABLET BY MOUTH AT BEDTIME AS NEEDED SLEEP 30 tablet 4  . [DISCONTINUED] ferrous sulfate 325 (65 FE) MG EC tablet Take 1 tablet (325 mg total) by mouth daily after breakfast. 30 tablet 3  . [DISCONTINUED] fluticasone (FLONASE) 50 MCG/ACT nasal spray USE 2 SPRAYS IN EACH NOSTRIL DAILY AS NEEDED 16 g 5  . [DISCONTINUED] metFORMIN (GLUCOPHAGE) 500 MG tablet Take 500 mg by mouth 2  (two) times daily with a meal.       No current facility-administered medications on file prior to visit.     ALLERGIES: Allergies  Allergen Reactions  . Morphine And Related Rash  . Adhesive [Tape] Rash and Other (See Comments)    Redness, blisters, skin peeling off.    FAMILY HISTORY: Family History  Problem Relation Age of Onset  . Cancer Father 54    colon  . Thyroid disease Mother     SOCIAL HISTORY: Social History   Social History  . Marital status: Married    Spouse name: N/A  . Number of children: 0  . Years of education: 79   Occupational History  . Nurse executive     Social History Main Topics  . Smoking status: Never Smoker  . Smokeless tobacco: Never Used  . Alcohol use 1.2 oz/week  2 Glasses of wine per week     Comment: 2-3 week with dinner  . Drug use: No  . Sexual activity: Not on file   Other Topics Concern  . Not on file   Social History Narrative   Lives at home with her husband.   Right-handed.   2-3 diet cokes per day.    REVIEW OF SYSTEMS: Constitutional: No fevers, chills, or sweats, no generalized fatigue, change in appetite Eyes: No visual changes, double vision, eye pain Ear, nose and throat: No hearing loss, ear pain, nasal congestion, sore throat Cardiovascular: No chest pain, palpitations Respiratory:  No shortness of breath at rest or with exertion, wheezes GastrointestinaI: No nausea, vomiting, diarrhea, abdominal pain, fecal incontinence Genitourinary:  No dysuria, urinary retention or frequency Musculoskeletal:  No neck pain, back pain Integumentary: No rash, pruritus, skin lesions Neurological: as above Psychiatric: No depression, insomnia, anxiety Endocrine: No palpitations, fatigue, diaphoresis, mood swings, change in appetite, change in weight, increased thirst Hematologic/Lymphatic:  No purpura, petechiae. Allergic/Immunologic: no itchy/runny eyes, nasal congestion, recent allergic reactions, rashes  PHYSICAL  EXAM: Vitals:   01/17/16 0809  BP: 136/70  Pulse: (!) 56   General: No acute distress.  Patient appears well-groomed.  Head:  Normocephalic/atraumatic Eyes:  fundi examined but not visualized Neck: supple, right paraspinal tenderness, full range of motion Back: Right sided paraspinal tenderness with swelling of the right paraspinal muscles. Heart: regular rate and rhythm Lungs: Clear to auscultation bilaterally. Vascular: No carotid bruits. Neurological Exam: Mental status: alert and oriented to person, place, and time, recent and remote memory intact, fund of knowledge intact, attention and concentration intact, speech fluent and not dysarthric, language intact. Cranial nerves: CN I: not tested CN II: pupils equal, round and reactive to light, visual fields intact CN III, IV, VI:  full range of motion, no nystagmus, no ptosis CN V: facial sensation intact CN VII: upper and lower face symmetric CN VIII: hearing intact CN IX, X: gag intact, uvula midline CN XI: sternocleidomastoid and trapezius muscles intact CN XII: tongue midline Bulk & Tone: normal, no fasciculations. Motor:  5/5 throughout  Sensation: temperature and vibration sensation intact. Deep Tendon Reflexes:  2+ throughout, toes downgoing.  Finger to nose testing:  Without dysmetria.  Heel to shin:  Without dysmetria.  Gait:  Normal station and stride.  Able to turn and tandem walk. Romberg negative.  IMPRESSION: Neck and back pain which I believe is musculoskeletal.  She does not exhibit neuropathic or radicular symptoms to suggest nerve root etiology.  Small cerebral AVM.  Stable.   PLAN: 1.  I would like to refer her to Dr. Hulan Saas for evaluation and for possible therapy.  Recommend continuing meloxicam and to try the Robaxin as needed (she should try taking it at home to see how it makes her feel and she should not drive when taking it).  May need to consider seeing spine specialist. 2.  Cerebral AVM is  being monitored by Dr. Estanislado Pandy.  3.  Follow up in one year or as needed.  Thank you for allowing me to take part in the care of this patient.  Metta Clines, DO  CC:  Deborra Medina, MD

## 2016-01-21 DIAGNOSIS — E538 Deficiency of other specified B group vitamins: Secondary | ICD-10-CM

## 2016-01-21 HISTORY — DX: Deficiency of other specified B group vitamins: E53.8

## 2016-01-31 NOTE — Telephone Encounter (Signed)
Mailed unread message to patient.  

## 2016-02-08 ENCOUNTER — Encounter: Payer: Self-pay | Admitting: Internal Medicine

## 2016-02-16 ENCOUNTER — Encounter: Payer: Self-pay | Admitting: Internal Medicine

## 2016-02-22 ENCOUNTER — Other Ambulatory Visit: Payer: 59

## 2016-02-22 ENCOUNTER — Ambulatory Visit: Payer: 59 | Admitting: Family

## 2016-02-22 NOTE — Progress Notes (Deleted)
Subjective:    Patient ID: Adrienne Robinson, female    DOB: December 08, 1955, 61 y.o.   MRN: [id number]  CC: Adrienne Robinson is a 61 y.o. female who presents today for an acute visit.    HPI: CC: congestion      HISTORY:  Past Medical History:  Diagnosis Date  . Anxiety   . 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 Back pain   . Degenerative joint disease involving multiple joints   . Depression   . Dizziness   . Fibromyalgia   . Headache(784.0)    having since  past month and a half  . Hypertension   . Pneumonia    approx 6 years ago  . Spinal stenosis of lumbar region   . Ulcerative proctitis [facility])    Past Surgical History:  Procedure Laterality Date  . ABDOMINAL HYSTERECTOMY    . APPENDECTOMY    . AV FISTULA REPAIR    . CARDIAC CATHETERIZATION     normal coronaries, no wall motion abnormalities 11/02/09 [facility])  . NASAL SINUS SURGERY    . OOPHORECTOMY    . RADIOLOGY WITH ANESTHESIA  02/26/2012   Procedure: RADIOLOGY WITH ANESTHESIA;  Surgeon: [name], MD;  Location: [place];  Service: Radiology;  Laterality: N/A;  . REVERSE SHOULDER ARTHROPLASTY Right 06/27/2013   Procedure: REVERSE RIGHT TOTAL SHOULDER ARTHROPLASTY;  Surgeon: [name], MD;  Location: [place];  Service: Orthopedics;  Laterality: Right;  . SHOULDER ARTHROSCOPY WITH ROTATOR CUFF REPAIR AND SUBACROMIAL DECOMPRESSION  2007   left  . TONSILLECTOMY  adnoids   Family History  Problem Relation Age of Onset  . Cancer Father 8    colon  . Thyroid disease Mother     Allergies: Morphine and related and Adhesive [tape] Current Outpatient Prescriptions on File Prior to Visit  Medication Sig Dispense Refill  . amLODipine (NORVASC) 10 MG tablet TAKE 1 TABLET (10 MG TOTAL) BY MOUTH DAILY. 90 tablet 3  . aspirin EC 81 MG tablet Take 81 mg by mouth daily.    . Azelastine-Fluticasone 137-50 MCG/ACT SUSP Place 1 spray into the nose 2 (two) times daily.  23 g 5  . buPROPion (WELLBUTRIN XL) 300 MG 24 hr tablet TAKE 1 TABLET (300 MG TOTAL) BY MOUTH DAILY. 90 tablet 1  . glucosamine-chondroitin 500-400 MG tablet Take 1 tablet by mouth daily.    Marland Kitchen HYDROcodone-acetaminophen (NORCO) 7.5-325 MG tablet Take 1 tablet by mouth every 4 (four) hours as needed for moderate pain. May refill on or after Apr 06 2016 180 tablet 0  . lisinopril (PRINIVIL,ZESTRIL) 5 MG tablet Take 1 tablet (5 mg total) by mouth daily. 90 tablet 1  . lubiprostone (AMITIZA) 24 MCG capsule TAKE 1 CAPSULE (24 MCG TOTAL) BY MOUTH 2 (TWO) TIMES DAILY WITH A MEAL. 180 capsule 1  . meloxicam (MOBIC) 15 MG tablet TAKE 1 TABLET (15 MG TOTAL) BY MOUTH DAILY. 90 tablet 1  . Multiple Vitamins-Minerals (MULTIVITAMIN WITH MINERALS) tablet Take 1 tablet by mouth daily.    Marland Kitchen zolpidem (AMBIEN) 10 MG tablet TAKE 1 TABLET BY MOUTH AT BEDTIME AS NEEDED SLEEP 30 tablet 4  . [DISCONTINUED] ferrous sulfate 325 (65 FE) MG EC tablet Take 1 tablet (325 mg total) by mouth daily after breakfast. 30 tablet 3  . [DISCONTINUED] fluticasone (FLONASE) 50 MCG/ACT nasal spray USE 2 SPRAYS IN EACH NOSTRIL DAILY AS NEEDED 16 g 5  . [  DISCONTINUED] metFORMIN (GLUCOPHAGE) 500 MG tablet Take 500 mg by mouth 2 (two) times daily with a meal.       No current facility-administered medications on file prior to visit.     Social History  Substance Use Topics  . Smoking status: Never Smoker  . Smokeless tobacco: Never Used  . Alcohol use 1.2 oz/week    2 Glasses of wine per week     Comment: 2-3 week with dinner    Review of Systems    Objective:    There were no vitals taken for this visit.   Physical Exam     Assessment & Plan:      I am having Ms. Leath maintain her multivitamin with minerals, glucosamine-chondroitin, aspirin EC, Azelastine-Fluticasone, amLODipine, lisinopril, lubiprostone, zolpidem, buPROPion, meloxicam, and HYDROcodone-acetaminophen.   No orders of the defined types were placed in  this encounter.   Return precautions given.   Risks, benefits, and alternatives of the medications and treatment plan prescribed today were discussed, and patient expressed understanding.   Education regarding symptom management and diagnosis given to patient on AVS.  Continue to follow with TULLO, Aris Everts, MD for routine health maintenance.   Kermit Balo and I agreed with plan.   Mable Paris, FNP

## 2016-02-23 ENCOUNTER — Encounter: Payer: Self-pay | Admitting: Neurology

## 2016-02-23 ENCOUNTER — Ambulatory Visit: Payer: 59 | Admitting: Neurology

## 2016-02-24 ENCOUNTER — Ambulatory Visit (INDEPENDENT_AMBULATORY_CARE_PROVIDER_SITE_OTHER): Payer: 59 | Admitting: Family Medicine

## 2016-02-24 ENCOUNTER — Encounter: Payer: Self-pay | Admitting: Family Medicine

## 2016-02-24 VITALS — BP 110/70 | HR 75 | Temp 98.2°F | Wt 120.0 lb

## 2016-02-24 DIAGNOSIS — J0100 Acute maxillary sinusitis, unspecified: Secondary | ICD-10-CM | POA: Diagnosis not present

## 2016-02-24 DIAGNOSIS — R944 Abnormal results of kidney function studies: Secondary | ICD-10-CM | POA: Diagnosis not present

## 2016-02-24 LAB — BASIC METABOLIC PANEL
BUN: 13 mg/dL (ref 6–23)
CO2: 27 mEq/L (ref 19–32)
Calcium: 9.6 mg/dL (ref 8.4–10.5)
Chloride: 101 mEq/L (ref 96–112)
Creatinine, Ser: 1.01 mg/dL (ref 0.40–1.20)
GFR: 59.4 mL/min — ABNORMAL LOW (ref >60.00)
Glucose, Bld: 113 mg/dL — ABNORMAL HIGH (ref 70–99)
Potassium: 3.9 mEq/L (ref 3.5–5.1)
Sodium: 133 mEq/L — ABNORMAL LOW (ref 135–145)

## 2016-02-24 MED ORDER — AMOXICILLIN-POT CLAVULANATE 875-125 MG PO TABS: 1.0000 | ORAL_TABLET | Freq: Two times a day (BID) | ORAL | 0 refills | Status: DC

## 2016-02-24 MED ORDER — PREDNISONE 20 MG PO TABS: 20.0000 mg | ORAL_TABLET | Freq: Every day | ORAL | 0 refills | Status: DC

## 2016-02-24 NOTE — Progress Notes (Signed)
Pre visit review using our clinic review tool, if applicable. No additional management support is needed unless otherwise documented below in the visit note. 

## 2016-02-24 NOTE — Progress Notes (Signed)
Subjective:    Patient ID: Adrienne Robinson, female    DOB: 05/05/1955, 61 y.o.   MRN: [id number]  HPI This is a 61 yo female who presents today with nasal congestion and fever x 10 days. Started with muscle aches, scratchy throat, headaches. Has now progressed to sinus pressure, ear pain R>L. Can't take decongestants- makes her feel jittery. Occasional green drainage, feels stopped up, pressure below and behind eyes. Using neti pot twice a day without relief. Had sinus surgery 10 years ago.   Past Medical History:  Diagnosis Date  . Anxiety   . 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 Back pain   . Degenerative joint disease involving multiple joints   . Depression   . Dizziness   . Fibromyalgia   . Headache(784.0)    having since  past month and a half  . Hypertension   . Pneumonia    approx 6 years ago  . Spinal stenosis of lumbar region   . Ulcerative proctitis [facility])    Past Surgical History:  Procedure Laterality Date  . ABDOMINAL HYSTERECTOMY    . APPENDECTOMY    . AV FISTULA REPAIR    . CARDIAC CATHETERIZATION     normal coronaries, no wall motion abnormalities 11/02/09 [facility])  . NASAL SINUS SURGERY    . OOPHORECTOMY    . RADIOLOGY WITH ANESTHESIA  02/26/2012   Procedure: RADIOLOGY WITH ANESTHESIA;  Surgeon: [name], MD;  Location: [facility];  Service: Radiology;  Laterality: N/A;  . REVERSE SHOULDER ARTHROPLASTY Right 06/27/2013   Procedure: REVERSE RIGHT TOTAL SHOULDER ARTHROPLASTY;  Surgeon: [name], MD;  Location: [facility];  Service: Orthopedics;  Laterality: Right;  . SHOULDER ARTHROSCOPY WITH ROTATOR CUFF REPAIR AND SUBACROMIAL DECOMPRESSION  2007   left  . TONSILLECTOMY  adnoids   Family History  Problem Relation Age of Onset  . Cancer Father 7    colon  . Thyroid disease Mother    Social History  Substance Use Topics  . Smoking status: Never Smoker  . Smokeless tobacco: Never Used  .  Alcohol use 1.2 oz/week    2 Glasses of wine per week     Comment: 2-3 week with dinner      Review of Systems Per HPI    Objective:   Physical Exam  Constitutional: She is oriented to person, place, and time. She appears well-developed and well-nourished. No distress.  HENT:  Head: Normocephalic and atraumatic.  Right Ear: External ear normal. A middle ear effusion is present.  Left Ear: Tympanic membrane, external ear and ear canal normal.  Nose: Mucosal edema and rhinorrhea present. Right sinus exhibits maxillary sinus tenderness. Right sinus exhibits no frontal sinus tenderness. Left sinus exhibits maxillary sinus tenderness. Left sinus exhibits no frontal sinus tenderness.  Mouth/Throat: Uvula is midline and oropharynx is clear and moist.  Eyes: Conjunctivae are normal.  Neck: Normal range of motion. Neck supple.  Cardiovascular: Normal rate, regular rhythm and normal heart sounds.   Pulmonary/Chest: Effort normal and breath sounds normal.  Lymphadenopathy:    She has no cervical adenopathy.  Neurological: She is alert and oriented to person, place, and time.  Skin: Skin is warm and dry. She is not diaphoretic.  Psychiatric: She has a normal mood and affect. Her behavior is normal. Judgment and thought content normal.  Vitals reviewed.     BP 110/70 (BP Location: Left Arm, Patient Position: Sitting,  Cuff Size: Normal)   Pulse 75   Temp 98.2 F (36.8 C) (Oral)   Wt 120 lb (54.4 kg)   SpO2 97%   BMI 18.79 kg/m  Wt Readings from Last 3 Encounters:  02/24/16 120 lb (54.4 kg)  01/17/16 123 lb (55.8 kg)  01/11/16 124 lb 4 oz (56.4 kg)       Assessment & Plan:  1. Acute non-recurrent maxillary sinusitis - amoxicillin-clavulanate (AUGMENTIN) 875-125 MG tablet; Take 1 tablet by mouth 2 (two) times daily.  Dispense: 14 tablet; Refill: 0 - predniSONE (DELTASONE) 20 MG tablet; Take 1 tablet (20 mg total) by mouth daily with breakfast.  Dispense: 5 tablet; Refill: 0 -  Provided written and verbal information regarding diagnosis and treatment.  2. Decreased GFR - Basic metabolic panel- she has an active order from PCP   Clarene Reamer, FNP-BC  [facility], [place]  02/26/2016 10:48 AM

## 2016-02-24 NOTE — Patient Instructions (Signed)

## 2016-02-25 ENCOUNTER — Ambulatory Visit (INDEPENDENT_AMBULATORY_CARE_PROVIDER_SITE_OTHER): Payer: 59 | Admitting: Neurology

## 2016-02-25 ENCOUNTER — Encounter: Payer: Self-pay | Admitting: Neurology

## 2016-02-25 ENCOUNTER — Telehealth: Payer: Self-pay

## 2016-02-25 ENCOUNTER — Other Ambulatory Visit (INDEPENDENT_AMBULATORY_CARE_PROVIDER_SITE_OTHER): Payer: 59

## 2016-02-25 VITALS — BP 110/80 | HR 71 | Ht 67.0 in | Wt 121.0 lb

## 2016-02-25 DIAGNOSIS — M5090 Cervical disc disorder, unspecified, unspecified cervical region: Secondary | ICD-10-CM | POA: Diagnosis not present

## 2016-02-25 DIAGNOSIS — R200 Anesthesia of skin: Secondary | ICD-10-CM | POA: Diagnosis not present

## 2016-02-25 DIAGNOSIS — R202 Paresthesia of skin: Secondary | ICD-10-CM

## 2016-02-25 LAB — VITAMIN B12: Vitamin B-12: 261 pg/mL (ref 211–911)

## 2016-02-25 NOTE — Progress Notes (Signed)
NEUROLOGY FOLLOW UP OFFICE NOTE  [name] [id number]  HISTORY OF PRESENT ILLNESS: Adrienne Robinson is a 61 year old right-handed woman with hypertension, chronic pain syndrome, lumbar spinal stenosis and intracranial AVM who follows up for numbness and tingling.  She developed a sinus infection the day after Christmas.  She developed sinus congestion and pain and congestion in the right ear.  She had also stopped meloxicam due to mild decreased kidney function.  Her arthritic neck pain started to act up.  She also took Sudafed (which she has had side effects in the past), which caused palpitations and feeling jittery.  She then developed intermittent numbness and tingling in her extremities.  She only experiences when she is up, not while laying down.  She reports numbness and tingling over the dorsum of either hand and forearm.  She has her chronic neck pain, which was worse on the right due to ear infection, but no radicular pain down the arms.  However, she also developed numbness and tingling in either leg as well.  It does not occur all the time.  She denies associated extremity pain and weakness.  She denies visual disturbance, gait instability, or bowel and bladder dysfunction.  She denies numbness of the face.  She was only started on treatment yesterday, Augmentin and prednisone taper.  PAST MEDICAL HISTORY: Past Medical History:  Diagnosis Date  . Anxiety   . 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 Back pain   . Degenerative joint disease involving multiple joints   . Depression   . Dizziness   . Fibromyalgia   . Headache(784.0)    having since  past month and a half  . Hypertension   . Pneumonia    approx 6 years ago  . Spinal stenosis of lumbar region   . Ulcerative proctitis [facility])     MEDICATIONS: Current Outpatient Prescriptions on File Prior to Visit  Medication Sig Dispense Refill  . amLODipine (NORVASC) 10  MG tablet TAKE 1 TABLET (10 MG TOTAL) BY MOUTH DAILY. 90 tablet 3  . amoxicillin-clavulanate (AUGMENTIN) 875-125 MG tablet Take 1 tablet by mouth 2 (two) times daily. 14 tablet 0  . aspirin EC 81 MG tablet Take 81 mg by mouth daily.    . Azelastine-Fluticasone 137-50 MCG/ACT SUSP Place 1 spray into the nose 2 (two) times daily. 23 g 5  . buPROPion (WELLBUTRIN XL) 300 MG 24 hr tablet TAKE 1 TABLET (300 MG TOTAL) BY MOUTH DAILY. 90 tablet 1  . glucosamine-chondroitin 500-400 MG tablet Take 1 tablet by mouth daily.    Marland Kitchen HYDROcodone-acetaminophen (NORCO) 7.5-325 MG tablet Take 1 tablet by mouth every 4 (four) hours as needed for moderate pain. May refill on or after Apr 06 2016 180 tablet 0  . lisinopril (PRINIVIL,ZESTRIL) 5 MG tablet Take 1 tablet (5 mg total) by mouth daily. 90 tablet 1  . lubiprostone (AMITIZA) 24 MCG capsule TAKE 1 CAPSULE (24 MCG TOTAL) BY MOUTH 2 (TWO) TIMES DAILY WITH A MEAL. 180 capsule 1  . meloxicam (MOBIC) 15 MG tablet TAKE 1 TABLET (15 MG TOTAL) BY MOUTH DAILY. 90 tablet 1  . Multiple Vitamins-Minerals (MULTIVITAMIN WITH MINERALS) tablet Take 1 tablet by mouth daily.    . predniSONE (DELTASONE) 20 MG tablet Take 1 tablet (20 mg total) by mouth daily with breakfast. 5 tablet 0  . zolpidem (AMBIEN) 10 MG tablet TAKE 1 TABLET BY MOUTH AT BEDTIME  AS NEEDED SLEEP 30 tablet 4  . [DISCONTINUED] ferrous sulfate 325 (65 FE) MG EC tablet Take 1 tablet (325 mg total) by mouth daily after breakfast. 30 tablet 3  . [DISCONTINUED] fluticasone (FLONASE) 50 MCG/ACT nasal spray USE 2 SPRAYS IN EACH NOSTRIL DAILY AS NEEDED 16 g 5  . [DISCONTINUED] metFORMIN (GLUCOPHAGE) 500 MG tablet Take 500 mg by mouth 2 (two) times daily with a meal.       No current facility-administered medications on file prior to visit.     ALLERGIES: Allergies  Allergen Reactions  . Morphine And Related Rash  . Adhesive [Tape] Rash and Other (See Comments)    Redness, blisters, skin peeling off.    FAMILY  HISTORY: Family History  Problem Relation Age of Onset  . Cancer Father 26    colon  . Thyroid disease Mother     SOCIAL HISTORY: Social History   Social History  . Marital status: Married    Spouse name: N/A  . Number of children: 0  . Years of education: 51   Occupational History  . Nurse executive     Social History Main Topics  . Smoking status: Never Smoker  . Smokeless tobacco: Never Used  . Alcohol use 1.2 oz/week    2 Glasses of wine per week     Comment: 2-3 week with dinner  . Drug use: No  . Sexual activity: Not on file   Other Topics Concern  . Not on file   Social History Narrative   Lives at home with her husband.   Right-handed.   2-3 diet cokes per day.    REVIEW OF SYSTEMS: Constitutional: No fevers, chills, or sweats, no generalized fatigue, change in appetite Eyes: No visual changes, double vision, eye pain Ear, nose and throat: ear pain, nasal congestion Cardiovascular: No chest pain, palpitations Respiratory:  No shortness of breath at rest or with exertion, wheezes GastrointestinaI: No nausea, vomiting, diarrhea, abdominal pain, fecal incontinence Genitourinary:  No dysuria, urinary retention or frequency Musculoskeletal:  Neck pain, back pain Integumentary: No rash, pruritus, skin lesions Neurological: as above Psychiatric: No depression, insomnia, anxiety Endocrine: No palpitations, fatigue, diaphoresis, mood swings, change in appetite, change in weight, increased thirst Hematologic/Lymphatic:  No purpura, petechiae. Allergic/Immunologic: no itchy/runny eyes, nasal congestion, recent allergic reactions, rashes  PHYSICAL EXAM: Vitals:   02/25/16 0740  BP: 110/80  Pulse: 71   General: No acute distress.  Patient appears well-groomed.  normal body habitus. Head:  Normocephalic/atraumatic Eyes:  Fundi examined but not visualized Neck: supple, no paraspinal tenderness, full range of motion Heart:  Regular rate and rhythm Lungs:   Clear to auscultation bilaterally Back: No paraspinal tenderness Neurological Exam: alert and oriented to person, place, and time. Attention span and concentration intact, recent and remote memory intact, fund of knowledge intact.  Speech fluent and not dysarthric, language intact.  CN II-XII intact. Bulk and tone normal, muscle strength 5/5 throughout.  Sensation to light touch  intact.  Deep tendon reflexes 2+ throughout.  Finger to nose testing intact.  Gait normal, Romberg negative.  Babinski and Hoffman sign absent.  IMPRESSION: Paresthesias.  Unclear etiology, but I don't suspect anything emergent.  I don't suspect it is related to her AVM.  She does have carpal tunnel and arthritis in the neck and back, however her symptoms are not consistent with that.  To have bilateral symptoms in both arms and legs, one would consider possible cervical spinal cord pathology, but she does not  demonstrate any signs of myelopathy.  PLAN: 1.  We will see how she does over the next week, as she is treated for her infection.  She will contact me in a week.  If symptoms persist, we can either check MRI of cervical spine or wait and see if she responds to OMM of the neck and back with Dr. Tamala Julian.  She will contact us if she has any worsening or new symptoms.  In meantime, we will check B12 level.  27 minutes spent face to face with patient, over 50% spent discussing possible etiologies and management.  Metta Clines, DO  CC:  Deborra Medina, MD

## 2016-02-25 NOTE — Patient Instructions (Signed)
1.  We will check a B12 level 2.  Contact me in 1 week and we can determine next step (MRI of cervical spine, etc)

## 2016-02-25 NOTE — Telephone Encounter (Signed)
-----   Message from Pieter Partridge, DO sent at 02/25/2016 10:50 AM EST ----- B12 level is 261, which is the very low end of normal.  I recommend taking over the counter B12 1050mcg daily.  We can recheck a level in 6 months.

## 2016-02-25 NOTE — Telephone Encounter (Signed)
Called patient. Gave lab results. Patient verbalized understanding.  

## 2016-02-28 ENCOUNTER — Encounter: Payer: Self-pay | Admitting: Neurology

## 2016-02-29 ENCOUNTER — Other Ambulatory Visit: Payer: Self-pay | Admitting: Internal Medicine

## 2016-03-01 NOTE — Telephone Encounter (Signed)
Last refill 09/06/15 #30 +4, last OV 11/201/17. Ok to refill?

## 2016-03-02 NOTE — Telephone Encounter (Signed)
Do you know if this rx was faxed or do I need to call it in?  

## 2016-03-05 ENCOUNTER — Other Ambulatory Visit: Payer: Self-pay | Admitting: Internal Medicine

## 2016-03-08 ENCOUNTER — Ambulatory Visit: Payer: 59 | Admitting: Family Medicine

## 2016-03-17 ENCOUNTER — Ambulatory Visit (INDEPENDENT_AMBULATORY_CARE_PROVIDER_SITE_OTHER): Payer: 59 | Admitting: Internal Medicine

## 2016-03-17 ENCOUNTER — Encounter: Payer: Self-pay | Admitting: Internal Medicine

## 2016-03-17 VITALS — BP 138/76 | HR 63 | Temp 97.9°F | Resp 16 | Ht 67.0 in | Wt 122.2 lb

## 2016-03-17 DIAGNOSIS — Z1231 Encounter for screening mammogram for malignant neoplasm of breast: Secondary | ICD-10-CM | POA: Diagnosis not present

## 2016-03-17 DIAGNOSIS — E538 Deficiency of other specified B group vitamins: Secondary | ICD-10-CM | POA: Diagnosis not present

## 2016-03-17 DIAGNOSIS — M064 Inflammatory polyarthropathy: Secondary | ICD-10-CM

## 2016-03-17 DIAGNOSIS — G894 Chronic pain syndrome: Secondary | ICD-10-CM

## 2016-03-17 DIAGNOSIS — R944 Abnormal results of kidney function studies: Secondary | ICD-10-CM

## 2016-03-17 DIAGNOSIS — M130 Polyarthritis, unspecified: Secondary | ICD-10-CM

## 2016-03-17 DIAGNOSIS — F321 Major depressive disorder, single episode, moderate: Secondary | ICD-10-CM

## 2016-03-17 DIAGNOSIS — M5090 Cervical disc disorder, unspecified, unspecified cervical region: Secondary | ICD-10-CM

## 2016-03-17 DIAGNOSIS — Z1239 Encounter for other screening for malignant neoplasm of breast: Secondary | ICD-10-CM

## 2016-03-17 DIAGNOSIS — Z800 Family history of malignant neoplasm of digestive organs: Secondary | ICD-10-CM

## 2016-03-17 MED ORDER — CYANOCOBALAMIN 1000 MCG/ML IJ SOLN
1000.0000 ug | Freq: Once | INTRAMUSCULAR | Status: AC
  Administered 2016-03-17: 1000 ug via INTRAMUSCULAR

## 2016-03-17 MED ORDER — HYDROCODONE-ACETAMINOPHEN 7.5-325 MG PO TABS: 1.0000 | ORAL_TABLET | ORAL | 0 refills | Status: DC | PRN

## 2016-03-17 MED ORDER — VORTIOXETINE HBR 10 MG PO TABS: ORAL_TABLET | ORAL | 0 refills | Status: DC

## 2016-03-17 NOTE — Progress Notes (Signed)
Subjective:  Patient ID: Adrienne Robinson, female    DOB: [birth year]  Age: 61 y.o. MRN: [id number]  CC: The primary encounter diagnosis was Breast cancer screening. Diagnoses of Family history of colon cancer requiring screening colonoscopy, Decreased GFR, B12 deficiency, Chronic pain syndrome, Cervical neck pain with evidence of disc disease, Polyarthritis of multiple sites [facility]), Family history of colon cancer, and Major depressive disorder, single episode, moderate ([place]) were also pertinent to this visit.  HPI Adrienne Robinson presents for follow up on multiple issues  Diagnosed with B12 deficiency.  Her   level was  261   otc b12 1000 mcg daily was  prescribed by neurology on jan 5   HISTORY:  MAXILLARY SINUSITIS  treated augmentin and prednisone BY np ON jAN 4TH .  DEVELOPED NUMBNESS AND TINGLING OF HANDS AND LEGS AND SAW NEUROLOGY JAN 5.   B12 DEFICIENCY NOTED (WAS 58 2 YEARS AGO)   SUSPENSION OF MELOXICAM DUE TO CHANGE IN GFR (mild).  Still using vicodin for management of multiple joint problems.    Depression; treated with wellbutrin . Feels it is no longer controlling her symptoms.  No  motivation, sad, no concentration,  Just "blah" dose of wellbutrin was reudced a while ago from 450.  Dose not want to see psychiatry.  Wants to switch to trintellix based on online research she has done    Outpatient Medications Prior to Visit  Medication Sig Dispense Refill  . amLODipine (NORVASC) 10 MG tablet TAKE 1 TABLET (10 MG TOTAL) BY MOUTH DAILY. 90 tablet 3  . aspirin EC 81 MG tablet Take 81 mg by mouth daily.    . Azelastine-Fluticasone 137-50 MCG/ACT SUSP Place 1 spray into the nose 2 (two) times daily. 23 g 5  . buPROPion (WELLBUTRIN XL) 300 MG 24 hr tablet TAKE 1 TABLET (300 MG TOTAL) BY MOUTH DAILY. 90 tablet 1  . lisinopril (PRINIVIL,ZESTRIL) 5 MG tablet Take 1 tablet (5 mg total) by mouth daily. 90 tablet 1  . lubiprostone (AMITIZA) 24 MCG capsule TAKE 1 CAPSULE (24 MCG TOTAL) BY  MOUTH 2 (TWO) TIMES DAILY WITH A MEAL. 180 capsule 1  . zolpidem (AMBIEN) 10 MG tablet QT BY MOUTH AT BEDTIME AS NEEDED FOR SLEEP 30 tablet 5  . HYDROcodone-acetaminophen (NORCO) 7.5-325 MG tablet Take 1 tablet by mouth every 4 (four) hours as needed for moderate pain. May refill on or after Apr 06 2016 180 tablet 0  . amoxicillin-clavulanate (AUGMENTIN) 875-125 MG tablet Take 1 tablet by mouth 2 (two) times daily. 14 tablet 0  . glucosamine-chondroitin 500-400 MG tablet Take 1 tablet by mouth daily.    . meloxicam (MOBIC) 15 MG tablet TAKE 1 TABLET (15 MG TOTAL) BY MOUTH DAILY. 90 tablet 1  . Multiple Vitamins-Minerals (MULTIVITAMIN WITH MINERALS) tablet Take 1 tablet by mouth daily.    . predniSONE (DELTASONE) 20 MG tablet Take 1 tablet (20 mg total) by mouth daily with breakfast. 5 tablet 0   No facility-administered medications prior to visit.     Review of Systems;  Patient denies headache, fevers, malaise, unintentional weight loss, skin rash, eye pain, sinus congestion and sinus pain, sore throat, dysphagia,  hemoptysis , cough, dyspnea, wheezing, chest pain, palpitations, orthopnea, edema, abdominal pain, nausea, melena, diarrhea, constipation, flank pain, dysuria, hematuria, urinary  Frequency, nocturia, numbness, tingling, seizures,  Focal weakness, Loss of consciousness,  Tremor, insomnia, , anxiety, and suicidal ideation.      Objective:  BP 138/76  Pulse 63   Temp 97.9 F (36.6 C) (Oral)   Resp 16   Ht 5\' 7"  (1.702 m)   Wt 122 lb 4 oz (55.5 kg)   SpO2 96%   BMI 19.15 kg/m   BP Readings from Last 3 Encounters:  03/17/16 138/76  02/25/16 110/80  02/24/16 110/70    Wt Readings from Last 3 Encounters:  03/17/16 122 lb 4 oz (55.5 kg)  02/25/16 121 lb (54.9 kg)  02/24/16 120 lb (54.4 kg)    General appearance: alert, cooperative and appears stated age Ears: normal TM's and external ear canals both ears Throat: lips, mucosa, and tongue normal; teeth and gums  normal Neck: no adenopathy, no carotid bruit, supple, symmetrical, trachea midline and thyroid not enlarged, symmetric, no tenderness/mass/nodules Back: symmetric, no curvature. ROM normal. No CVA tenderness. Lungs: clear to auscultation bilaterally Heart: regular rate and rhythm, S1, S2 normal, no murmur, click, rub or gallop Abdomen: soft, non-tender; bowel sounds normal; no masses,  no organomegaly Pulses: 2+ and symmetric Skin: Skin color, texture, turgor normal. No rashes or lesions Lymph nodes: Cervical, supraclavicular, and axillary nodes normal.  Lab Results  Component Value Date   HGBA1C 5.7 08/04/2011    Lab Results  Component Value Date   CREATININE 1.01 02/24/2016   CREATININE 1.19 01/11/2016   CREATININE 1.09 06/07/2015    Lab Results  Component Value Date   WBC 4.8 03/04/2015   HGB 10.9 (L) 03/04/2015   HCT 32.1 (L) 03/04/2015   PLT 291 03/04/2015   GLUCOSE 113 (H) 02/24/2016   CHOL 216 (H) 08/06/2014   TRIG 101.0 08/06/2014   HDL 63.10 08/06/2014   LDLDIRECT 160.2 04/11/2013   LDLCALC 133 (H) 08/06/2014   ALT 16 01/11/2016   AST 14 01/11/2016   NA 133 (L) 02/24/2016   K 3.9 02/24/2016   CL 101 02/24/2016   CREATININE 1.01 02/24/2016   BUN 13 02/24/2016   CO2 27 02/24/2016   TSH 0.72 08/06/2014   INR 0.94 03/04/2015   HGBA1C 5.7 08/04/2011   MICROALBUR 1.6 11/01/2011    Dg Abd 2 Views  Result Date: 06/07/2015 CLINICAL DATA:  History of ileus.  Subsequent encounter. EXAM: ABDOMEN - 2 VIEW COMPARISON:  Two views of the abdomen in 06/03/2015. FINDINGS: No free intraperitoneal air is identified. The bowel gas pattern is nonobstructive. No evidence of ileus is seen. Mild gaseous distention of the transverse colon is noted. Convex right scoliosis is identified. IMPRESSION: No acute abnormality.  Negative for ileus. Electronically Signed   By: Inge Rise M.D.   On: 06/07/2015 10:51    Assessment & Plan:   Problem List Items Addressed This Visit     B12 deficiency    Diagnosed by neurology,  Changing oral supplements to injectible.       Relevant Medications   cyanocobalamin ((VITAMIN B-12)) injection 1,000 mcg (Completed)   Cervical neck pain with evidence of disc disease    She is scheduled to have ESI injections.       Relevant Medications   HYDROcodone-acetaminophen (NORCO) 7.5-325 MG tablet   Chronic pain syndrome    She has chronic back pain Complicated by scoliosis. She is unable to sit for prolonged periods of time without increased pain.  She uses a standing desk at home. She continues to require 6 vicodin daily to manage her pain .  Refill history confirmed via [place] Controlled Substance databas, accessed by me today..REfills for 3 months given  Family history of colon cancer    She would like to be referred to [facility] for follow up .  Has family history of colon ca (father) along with ulcerative colitis       Major depressive disorder, single episode, moderate ([facility])    Trial of Trintellix ,  Tapering off of wellbutrin       Relevant Medications   vortioxetine HBr (TRINTELLIX) 10 MG TABS   Polyarthritis of multiple sites ([place])    Cautions retrial of  meloxicam at 78.5 mg daily        Other Visit Diagnoses    Breast cancer screening    -  Primary   Relevant Orders   MM DIGITAL SCREENING BILATERAL   Family history of colon cancer requiring screening colonoscopy       Relevant Orders   Ambulatory referral to Gastroenterology   Decreased GFR       Relevant Orders   Basic metabolic panel      I have discontinued Ms. Stansbury's multivitamin with minerals, glucosamine-chondroitin, meloxicam, HYDROcodone-acetaminophen, amoxicillin-clavulanate, predniSONE, and HYDROcodone-acetaminophen. I have also changed her HYDROcodone-acetaminophen. Additionally, I am having her start on vortioxetine HBr. Lastly, I am having her maintain her aspirin EC, Azelastine-Fluticasone, amLODipine, lisinopril, lubiprostone, buPROPion,  zolpidem, and cyanocobalamin. We administered cyanocobalamin.  Meds ordered this encounter  Medications  . cyanocobalamin 1000 MCG tablet    Sig: Take 1,000 mcg by mouth daily.  Marland Kitchen vortioxetine HBr (TRINTELLIX) 10 MG TABS    Sig: 1 tablet daily x 2 weeks, increase to 2 tablets thereafter    Dispense:  90 tablet    Refill:  0  . DISCONTD: HYDROcodone-acetaminophen (NORCO) 7.5-325 MG tablet    Sig: Take 1 tablet by mouth every 4 (four) hours as needed for moderate pain. May refill on or after May 04 2016    Dispense:  180 tablet    Refill:  0  . DISCONTD: HYDROcodone-acetaminophen (NORCO) 7.5-325 MG tablet    Sig: Take 1 tablet by mouth every 4 (four) hours as needed for moderate pain. May refill on or after  June 04 2016    Dispense:  180 tablet    Refill:  0  . HYDROcodone-acetaminophen (NORCO) 7.5-325 MG tablet    Sig: Take 1 tablet by mouth every 4 (four) hours as needed for moderate pain. May refill on or after  Jul 04 2016    Dispense:  180 tablet    Refill:  0  . cyanocobalamin ((VITAMIN B-12)) injection 1,000 mcg    Medications Discontinued During This Encounter  Medication Reason  . amoxicillin-clavulanate (AUGMENTIN) 875-125 MG tablet Completed Course  . glucosamine-chondroitin 500-400 MG tablet Change in therapy  . meloxicam (MOBIC) 15 MG tablet No longer needed (for PRN medications)  . Multiple Vitamins-Minerals (MULTIVITAMIN WITH MINERALS) tablet No longer needed (for PRN medications)  . predniSONE (DELTASONE) 20 MG tablet Completed Course  . HYDROcodone-acetaminophen (NORCO) 7.5-325 MG tablet Reorder  . HYDROcodone-acetaminophen (NORCO) 7.5-325 MG tablet Reorder  . HYDROcodone-acetaminophen (NORCO) 7.5-325 MG tablet Reorder    Follow-up: Return in about 4 months (around 07/24/2016).   [name], MD

## 2016-03-17 NOTE — Patient Instructions (Addendum)
Start the trintellix at 1/2 tablet for one week,.  continue  wellbutrin at current dose for one week  Week 2:  Full tablet to  trintellx daily.  1 wellbutrin  every other day or one week, then every 2 days  for one week , then stop  Week 4:  Increase trintellix to 2 tablets daily  (20 mg  Total)    Resume meloxicam at 7.5 mg daily of 15 mg every other day,  Recheck Cr in 2 weeks   If B12 injection makes a big difference in energy or tingling,  You can self administer as often as weekly.

## 2016-03-19 DIAGNOSIS — F321 Major depressive disorder, single episode, moderate: Secondary | ICD-10-CM | POA: Insufficient documentation

## 2016-03-19 DIAGNOSIS — E538 Deficiency of other specified B group vitamins: Secondary | ICD-10-CM | POA: Insufficient documentation

## 2016-03-19 NOTE — Assessment & Plan Note (Signed)
Cautions retrial of  meloxicam at 78.5 mg daily

## 2016-03-19 NOTE — Assessment & Plan Note (Signed)
She is scheduled to have ESI injections.

## 2016-03-19 NOTE — Assessment & Plan Note (Signed)
She would like to be referred to [facility] for follow up .  Has family history of colon ca (father) along with ulcerative colitis

## 2016-03-19 NOTE — Assessment & Plan Note (Signed)
Diagnosed by neurology,  Changing oral supplements to injectible.

## 2016-03-19 NOTE — Assessment & Plan Note (Signed)
Trial of Trintellix ,  Tapering off of wellbutrin

## 2016-03-19 NOTE — Assessment & Plan Note (Signed)
She has chronic back pain Complicated by scoliosis. She is unable to sit for prolonged periods of time without increased pain.  She uses a standing desk at home. She continues to require 6 vicodin daily to manage her pain .  Refill history confirmed via [place] Controlled Substance databas, accessed by me today..REfills for 3 months given  

## 2016-03-21 NOTE — Progress Notes (Signed)
[name] [address], [place] [address] Phone: [phone] Subjective:    I'm seeing this patient by the request  of:  TULLO, Aris Everts, MD   CC: Back pain  [id number]  Adrienne Robinson is a 61 y.o. female coming in with complaint of low back pain. 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 Patient states that she is having more back and neck pain. Has seen multiple providers for this previously. Has seen neurology as well.  Patient is complaining more of the lower back pain. States that seems to be tight. Has been told that she has had facet arthropathy as well as potentially spinal stenosis. Did respond well to injections long time ago. States that he could've been over a decade. States that no radiation of pain. Seems to stay localized. Mostly on the right side than the left side. Rates the severity of pain a 6 out of 10. Worse in the mornings. States that it seems to get better with activity.     Past Medical History:  Diagnosis Date  . Anxiety   . 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 B12 deficiency 01/2016  . Back pain   . Degenerative joint disease involving multiple joints   . Depression   . Dizziness   . Fibromyalgia   . Headache(784.0)    having since  past month and a half  . Hypertension   . Pneumonia    approx 6 years ago  . Spinal stenosis of lumbar region   . Ulcerative proctitis [facility])    Past Surgical History:  Procedure Laterality Date  . ABDOMINAL HYSTERECTOMY    . APPENDECTOMY    . AV FISTULA REPAIR    . CARDIAC CATHETERIZATION     normal coronaries, no wall motion abnormalities 11/02/09 [facility])  . NASAL SINUS SURGERY    . OOPHORECTOMY    . RADIOLOGY WITH ANESTHESIA  02/26/2012   Procedure: RADIOLOGY WITH ANESTHESIA;  Surgeon: [name], MD;  Location: [facility];  Service:  Radiology;  Laterality: N/A;  . REVERSE SHOULDER ARTHROPLASTY Right 06/27/2013   Procedure: REVERSE RIGHT TOTAL SHOULDER ARTHROPLASTY;  Surgeon: [name], MD;  Location: [facility];  Service: Orthopedics;  Laterality: Right;  . SHOULDER ARTHROSCOPY WITH ROTATOR CUFF REPAIR AND SUBACROMIAL DECOMPRESSION  2007   left  . TONSILLECTOMY  adnoids   Social History   Social History  . Marital status: Married    Spouse name: N/A  . Number of children: 0  . Years of education: 41   Occupational History  . Nurse executive     Social History Main Topics  . Smoking status: Never Smoker  . Smokeless tobacco: Never Used  . Alcohol use 1.2 oz/week    2 Glasses of wine per week     Comment: 2-3 week with dinner  . Drug use: No  . Sexual activity: Not Asked   Other Topics Concern  . None   Social History Narrative   Lives at home with her husband.   Right-handed.   2-3 diet cokes per day.   Allergies  Allergen Reactions  . Morphine And Related Rash  . Adhesive [Tape] Rash and Other (See Comments)    Redness, blisters, skin peeling off.   Family History  Problem Relation Age of Onset  . Cancer Father 39  colon  . Thyroid disease Mother     Past medical history, social, surgical and family history all reviewed in electronic medical record.  No pertanent information unless stated regarding to the chief complaint.   Review of Systems:Review of systems updated and as accurate as of 03/22/16  No headache, visual changes, nausea, vomiting, diarrhea, constipation, dizziness, abdominal pain, skin rash, fevers, chills, night sweats, weight loss, swollen lymph nodes, body aches, joint swelling,chest pain, shortness of breath, mood changes.   Objective  Blood pressure 118/72, pulse 66, height 5\' 6"  (1.676 m), weight 120 lb 6.4 oz (54.6 kg). Systems examined below as of 03/22/16   General: No apparent distress alert and oriented x3 mood and affect normal, dressed appropriately.  HEENT:  Pupils equal, extraocular movements intact  Respiratory: Patient's speak in full sentences and does not appear short of breath  Cardiovascular: No lower extremity edema, non tender, no erythema  Skin: Warm dry intact with no signs of infection or rash on extremities or on axial skeleton.  Abdomen: Soft nontender  Neuro: Cranial nerves II through XII are intact, neurovascularly intact in all extremities with 2+ DTRs and 2+ pulses.  Lymph: No lymphadenopathy of posterior or anterior cervical chain or axillae bilaterally.  Gait normal with good balance and coordination.  MSK:  Non tender with full range of motion and good stability and symmetric strength and tone of shoulders, elbows, wrist, hip, knee and ankles bilaterally. Hypermobility of multiple joints. Neck: Inspection unremarkable. No palpable stepoffs. Negative Spurling's maneuver. Mild limitation in range of motion in all planes. Grip strength and sensation normal in bilateral hands Strength good C4 to T1 distribution No sensory change to C4 to T1 Negative Hoffman sign bilaterally Reflexes normal  Back Exam:  Inspection: Mild scoliosis of the thoracolumbar juncture Motion: Flexion 40 deg, Extension 25 deg, Side Bending to 45 deg bilaterally,  Rotation to 45 deg bilaterally  SLR laying: Negative  XSLR laying: Negative  Palpable tenderness: Tender to palpation in the paraspinal musculature of the lumbar spine right greater than left. FABER: negative. Sensory change: Gross sensation intact to all lumbar and sacral dermatomes.  Reflexes: 2+ at both patellar tendons, 2+ at achilles tendons, Babinski's downgoing.  Strength at foot  Plantar-flexion: 5/5 Dorsi-flexion: 5/5 Eversion: 5/5 Inversion: 5/5  Leg strength  Quad: 5/5 Hamstring: 5/5 Hip flexor: 5/5 Hip abductors: 5/5  Gait unremarkable.  Osteopathic findings  T3 extended rotated and side bent right inhaled third rib T9 extended rotated and side bent left L2 flexed  rotated and side bent right Sacrum right on right  Procedure note 97110; 15 minutes spent for Therapeutic exercises as stated in above notes.  This included exercises focusing on stretching, strengthening, with significant focus on eccentric aspects.  Low back exercises that included:  Pelvic tilt/bracing instruction to focus on control of the pelvic girdle and lower abdominal muscles  Glute strengthening exercises, focusing on proper firing of the glutes without engaging the low back muscles Proper stretching techniques for maximum relief for the hamstrings, hip flexors, low back and some rotation where tolerated  Proper technique shown and discussed handout in great detail with ATC.  All questions were discussed and answered.     Impression and Recommendations:     This case required medical decision making of moderate complexity.      Note: This dictation was prepared with Dragon dictation along with smaller phrase technology. Any transcriptional errors that result from this process are unintentional.

## 2016-03-22 ENCOUNTER — Encounter: Payer: Self-pay | Admitting: Internal Medicine

## 2016-03-22 ENCOUNTER — Encounter: Payer: Self-pay | Admitting: Family Medicine

## 2016-03-22 ENCOUNTER — Ambulatory Visit (INDEPENDENT_AMBULATORY_CARE_PROVIDER_SITE_OTHER)
Admission: RE | Admit: 2016-03-22 | Discharge: 2016-03-22 | Disposition: A | Discharge status: Home / Self Care | Payer: 59 | Source: Ambulatory Visit | Attending: Family Medicine | Admitting: Family Medicine

## 2016-03-22 ENCOUNTER — Ambulatory Visit (INDEPENDENT_AMBULATORY_CARE_PROVIDER_SITE_OTHER): Payer: 59 | Admitting: Family Medicine

## 2016-03-22 VITALS — BP 118/72 | HR 66 | Ht 66.0 in | Wt 120.4 lb

## 2016-03-22 DIAGNOSIS — M5416 Radiculopathy, lumbar region: Secondary | ICD-10-CM

## 2016-03-22 DIAGNOSIS — M48061 Spinal stenosis, lumbar region without neurogenic claudication: Secondary | ICD-10-CM | POA: Diagnosis not present

## 2016-03-22 DIAGNOSIS — M999 Biomechanical lesion, unspecified: Secondary | ICD-10-CM | POA: Diagnosis not present

## 2016-03-22 NOTE — Assessment & Plan Note (Addendum)
Decision today to treat with OMT was based on Physical Exam  After verbal consent patient was treated with HVLA, ME, FPR techniques in thoracic lumbar and sacral areas  Patient tolerated the procedure well with improvement in symptoms  Patient given exercises, stretches and lifestyle modifications  See medications in patient instructions if given  Patient will follow up in 3-4 weeks

## 2016-03-22 NOTE — Patient Instructions (Signed)
Good to see you.  Ice 20 minutes 2 times daily. Usually after activity and before bed. Once weekly vitamin D for 12 weeks.  Exercises 3 times a week.  Stay active.  Turmeric 500mg  1-2 times daily for inflammation.  Tart cherry extract any dose at night.  You should do great  Xray downstairs today  See em again in 3 weeks and we will likely try manipulation again.

## 2016-03-22 NOTE — Assessment & Plan Note (Signed)
Patient is history of spinal stenosis and lumbar spine. Responded very well to osteopathic manipulation. Work with Product/process development scientist to learn home exercises in greater detail. We discussed icing regimen. Discussed the importance of strengthening as well as continuing the range of motion. Patient will try over-the-counter medications for any breakthrough pain and try to do this naturally. Follow-up with me again in 3-4 weeks.

## 2016-03-23 ENCOUNTER — Encounter: Payer: Self-pay | Admitting: Family Medicine

## 2016-03-23 ENCOUNTER — Encounter: Payer: Self-pay | Admitting: Internal Medicine

## 2016-03-24 ENCOUNTER — Telehealth (HOSPITAL_COMMUNITY): Payer: Self-pay

## 2016-03-24 MED ORDER — VITAMIN D (ERGOCALCIFEROL) 1.25 MG (50000 UNIT) PO CAPS: 50000.0000 [IU] | ORAL_CAPSULE | ORAL | 0 refills | Status: DC

## 2016-03-24 NOTE — Telephone Encounter (Signed)
Left message for pt to return call. AW 

## 2016-03-25 ENCOUNTER — Other Ambulatory Visit: Payer: Self-pay | Admitting: Internal Medicine

## 2016-03-25 MED ORDER — "SYRINGE 25G X 1"" 3 ML MISC": 0 refills | Status: DC

## 2016-03-25 MED ORDER — CYANOCOBALAMIN 1000 MCG/ML IJ SOLN: 1000.0000 ug | INTRAMUSCULAR | 5 refills | Status: DC

## 2016-03-27 ENCOUNTER — Telehealth: Payer: Self-pay | Admitting: Internal Medicine

## 2016-03-29 ENCOUNTER — Ambulatory Visit: Payer: 59 | Admitting: Neurology

## 2016-03-31 ENCOUNTER — Encounter: Payer: Self-pay | Admitting: Internal Medicine

## 2016-04-03 ENCOUNTER — Encounter: Payer: Self-pay | Admitting: Internal Medicine

## 2016-04-05 ENCOUNTER — Encounter: Payer: Self-pay | Admitting: Internal Medicine

## 2016-04-07 ENCOUNTER — Ambulatory Visit (INDEPENDENT_AMBULATORY_CARE_PROVIDER_SITE_OTHER): Payer: 59 | Admitting: Internal Medicine

## 2016-04-07 ENCOUNTER — Other Ambulatory Visit: Payer: 59

## 2016-04-07 ENCOUNTER — Encounter: Payer: Self-pay | Admitting: Internal Medicine

## 2016-04-07 DIAGNOSIS — N19 Unspecified kidney failure: Secondary | ICD-10-CM

## 2016-04-07 DIAGNOSIS — R7989 Other specified abnormal findings of blood chemistry: Secondary | ICD-10-CM

## 2016-04-07 DIAGNOSIS — R944 Abnormal results of kidney function studies: Secondary | ICD-10-CM

## 2016-04-07 DIAGNOSIS — F321 Major depressive disorder, single episode, moderate: Secondary | ICD-10-CM

## 2016-04-07 LAB — BASIC METABOLIC PANEL
BUN: 23 mg/dL (ref 7–25)
CO2: 26 mmol/L (ref 20–31)
Calcium: 9.5 mg/dL (ref 8.6–10.4)
Chloride: 99 mmol/L (ref 98–110)
Creat: 1.35 mg/dL — ABNORMAL HIGH (ref 0.50–0.99)
Glucose, Bld: 87 mg/dL (ref 65–99)
Potassium: 3.7 mmol/L (ref 3.5–5.3)
Sodium: 136 mmol/L (ref 135–146)

## 2016-04-07 MED ORDER — BUPROPION HCL ER (XL) 150 MG PO TB24: ORAL_TABLET | ORAL | 3 refills | Status: DC

## 2016-04-07 NOTE — Progress Notes (Signed)
Pre visit review using our clinic review tool, if applicable. No additional management support is needed unless otherwise documented below in the visit note. 

## 2016-04-07 NOTE — Progress Notes (Signed)
Subjective:  Patient ID: Adrienne Robinson, female    DOB: 1955/03/31  Age: 61 y.o. MRN: [id number]  CC: Diagnoses of Decreased GFR, Major depressive disorder, single episode, moderate ([place]), and Acute prerenal azotemia were pertinent to this visit.  HPI Adrienne Robinson presents for evaluation of adverse reaction to trintellix.   She developed abdominal bloating accompanied by nausea,  And had loose stools which lasted for an entire week until she stopped the medication.  Last episode was yesterday .Husband was recently hospitalized with influenza and diabetic foot ulcer so she has been exposed to infectious etiologies but denies fevers.   Lost 4 lbs appetite finally improving   Taking 300 mg wellbutrin every other day currently.       Outpatient Medications Prior to Visit  Medication Sig Dispense Refill  . amLODipine (NORVASC) 10 MG tablet TAKE 1 TABLET (10 MG TOTAL) BY MOUTH DAILY. 90 tablet 3  . aspirin EC 81 MG tablet Take 81 mg by mouth daily.    . Azelastine-Fluticasone 137-50 MCG/ACT SUSP Place 1 spray into the nose 2 (two) times daily. 23 g 5  . cyanocobalamin (,VITAMIN B-12,) 1000 MCG/ML injection Inject 1 mL (1,000 mcg total) into the muscle once a week. 10 mL 5  . HYDROcodone-acetaminophen (NORCO) 7.5-325 MG tablet Take 1 tablet by mouth every 4 (four) hours as needed for moderate pain. May refill on or after  Jul 04 2016 180 tablet 0  . lisinopril (PRINIVIL,ZESTRIL) 5 MG tablet Take 1 tablet (5 mg total) by mouth daily. 90 tablet 1  . lubiprostone (AMITIZA) 24 MCG capsule TAKE 1 CAPSULE (24 MCG TOTAL) BY MOUTH 2 (TWO) TIMES DAILY WITH A MEAL. 180 capsule 1  . Syringe/Needle, Disp, (SYRINGE 3CC/25GX1") 25G X 1" 3 ML MISC Use for b12 injections 50 each 0  . Vitamin D, Ergocalciferol, (DRISDOL) 50000 units CAPS capsule Take 1 capsule (50,000 Units total) by mouth every 7 (seven) days. 12 capsule 0  . zolpidem (AMBIEN) 10 MG tablet QT BY MOUTH AT BEDTIME AS NEEDED FOR SLEEP 30  tablet 5  . buPROPion (WELLBUTRIN XL) 300 MG 24 hr tablet TAKE 1 TABLET (300 MG TOTAL) BY MOUTH DAILY. 90 tablet 1  . vortioxetine HBr (TRINTELLIX) 10 MG TABS 1 tablet daily x 2 weeks, increase to 2 tablets thereafter (Patient not taking: Reported on 04/07/2016) 90 tablet 0   No facility-administered medications prior to visit.     Review of Systems;  Patient denies headache, fevers, malaise,, skin rash, eye pain, sinus congestion and sinus pain, sore throat, dysphagia,  hemoptysis , cough, dyspnea, wheezing, chest pain, palpitations, orthopnea, edema, abdominal pain,  melena, diarrhea, constipation, flank pain, dysuria, hematuria, urinary  Frequency, nocturia, numbness, tingling, seizures,  Focal weakness, Loss of consciousness,  Tremor, insomnia, depression, anxiety, and suicidal ideation.      Objective:  BP 130/80   Pulse 68   Temp 98.2 F (36.8 C) (Oral)   Resp 16   Wt 116 lb (52.6 kg)   SpO2 97%   BMI 18.72 kg/m   BP Readings from Last 3 Encounters:  04/07/16 130/80  03/22/16 118/72  03/17/16 138/76    Wt Readings from Last 3 Encounters:  04/07/16 116 lb (52.6 kg)  03/22/16 120 lb 6.4 oz (54.6 kg)  03/17/16 122 lb 4 oz (55.5 kg)    General appearance: alert, cooperative and appears stated age Ears: normal TM's and external ear canals both ears Throat: lips, mucosa, and tongue normal; teeth and gums  normal Neck: no adenopathy, no carotid bruit, supple, symmetrical, trachea midline and thyroid not enlarged, symmetric, no tenderness/mass/nodules Back: symmetric, no curvature. ROM normal. No CVA tenderness. Lungs: clear to auscultation bilaterally Heart: regular rate and rhythm, S1, S2 normal, no murmur, click, rub or gallop Abdomen: soft, non-tender; bowel sounds normal; no masses,  no organomegaly Pulses: 2+ and symmetric Skin: Skin color, texture, turgor normal. No rashes or lesions Lymph nodes: Cervical, supraclavicular, and axillary nodes normal.  Lab Results    Component Value Date   HGBA1C 5.7 08/04/2011    Lab Results  Component Value Date   CREATININE 1.35 (H) 04/07/2016   CREATININE 1.01 02/24/2016   CREATININE 1.19 01/11/2016    Lab Results  Component Value Date   WBC 4.8 03/04/2015   HGB 10.9 (L) 03/04/2015   HCT 32.1 (L) 03/04/2015   PLT 291 03/04/2015   GLUCOSE 87 04/07/2016   CHOL 216 (H) 08/06/2014   TRIG 101.0 08/06/2014   HDL 63.10 08/06/2014   LDLDIRECT 160.2 04/11/2013   LDLCALC 133 (H) 08/06/2014   ALT 16 01/11/2016   AST 14 01/11/2016   NA 136 04/07/2016   K 3.7 04/07/2016   CL 99 04/07/2016   CREATININE 1.35 (H) 04/07/2016   BUN 23 04/07/2016   CO2 26 04/07/2016   TSH 0.72 08/06/2014   INR 0.94 03/04/2015   HGBA1C 5.7 08/04/2011   MICROALBUR 1.6 11/01/2011    Dg Lumbar Spine Complete  Result Date: 03/22/2016 CLINICAL DATA:  Low back pain. EXAM: LUMBAR SPINE - COMPLETE 4+ VIEW COMPARISON:  06/07/2015.  06/18/2013 . FINDINGS: Lumbar vertebra number with the lowest segmented appearing lumbar shaped vertebral body on lateral view as L5. Lumbar scoliosis concave left. Mild T11 and T12 compression fractures, age undetermined. Densities noted over the abdomen and pelvis most likely pill fragments. IMPRESSION: Lumbar scoliosis concave left. Mild T11 and T12 compression fractures, age undetermined. MRI of the thoracic spine can be obtained to further evaluate as needed . No acute lumbar abnormalities identified . Electronically Signed   By: Marcello Moores  Register   On: 03/22/2016 11:48    Assessment & Plan:   Problem List Items Addressed This Visit    Acute prerenal azotemia    Suggested by labs today,  Secondary to prolonged GI losses .  Advised to rehydrate and recheck in one week   Lab Results  Component Value Date   CREATININE 1.35 (H) 04/07/2016   Lab Results  Component Value Date   NA 136 04/07/2016   K 3.7 04/07/2016   CL 99 04/07/2016   CO2 26 04/07/2016         Major depressive disorder, single  episode, moderate ([place])    Did not tolerate trial of trintellix due to persistent nausea and diarrhea.  Resume wellbutrin 300 mg daily x 2 weeks,  Increase daily dose to 450 mg in 2 weeks       Relevant Medications   buPROPion (WELLBUTRIN XL) 150 MG 24 hr tablet    Other Visit Diagnoses    Decreased GFR          I have discontinued Ms. Rivenbark's vortioxetine HBr. I have also changed her buPROPion. Additionally, I am having her maintain her aspirin EC, Azelastine-Fluticasone, amLODipine, lisinopril, lubiprostone, zolpidem, HYDROcodone-acetaminophen, Vitamin D (Ergocalciferol), cyanocobalamin, and SYRINGE 3CC/25GX1".  Meds ordered this encounter  Medications  . buPROPion (WELLBUTRIN XL) 150 MG 24 hr tablet    Sig: TAKE 1 TABLET (300 MG TOTAL) BY MOUTH DAILY.  Dispense:  30 tablet    Refill:  3    Medications Discontinued During This Encounter  Medication Reason  . vortioxetine HBr (TRINTELLIX) 10 MG TABS Side effect (s)  . buPROPion (WELLBUTRIN XL) 300 MG 24 hr tablet Reorder    Follow-up: No Follow-up on file.   [name], MD

## 2016-04-07 NOTE — Addendum Note (Signed)
Addended by: Arby Barrette on: 04/07/2016 04:44 PM   Modules accepted: Orders

## 2016-04-07 NOTE — Patient Instructions (Addendum)
You can increase the wellbutrin dose from 300 mg daily to 450 mg after 2 weeks of daily dosing of 300 mg   If you tolerate this dose we can  change the medication to  Once daily 450 mg tablet (if it's not $$$)   You might want to try a premixed protein drink called Premier Protein shake for breakfast or late night snack . It is great tasting,   very low sugar and available of < $2 serving at [facility] and  In bulk for $1.50/serving at [employer] and [employer]  .    Nutritional analysis :  160 cal  30 g protein  1 g sugar 50% calcium needs   Vladimir Faster and [employer]

## 2016-04-09 ENCOUNTER — Encounter: Payer: Self-pay | Admitting: Internal Medicine

## 2016-04-09 DIAGNOSIS — R7989 Other specified abnormal findings of blood chemistry: Secondary | ICD-10-CM | POA: Insufficient documentation

## 2016-04-09 DIAGNOSIS — N19 Unspecified kidney failure: Secondary | ICD-10-CM | POA: Insufficient documentation

## 2016-04-09 NOTE — Assessment & Plan Note (Signed)
Suggested by labs today,  Secondary to prolonged GI losses .  Advised to rehydrate and recheck in one week   Lab Results  Component Value Date   CREATININE 1.35 (H) 04/07/2016   Lab Results  Component Value Date   NA 136 04/07/2016   K 3.7 04/07/2016   CL 99 04/07/2016   CO2 26 04/07/2016

## 2016-04-09 NOTE — Assessment & Plan Note (Signed)
Did not tolerate trial of trintellix due to persistent nausea and diarrhea.  Resume wellbutrin 300 mg daily x 2 weeks,  Increase daily dose to 450 mg in 2 weeks

## 2016-04-10 ENCOUNTER — Ambulatory Visit: Payer: 59 | Admitting: Family Medicine

## 2016-04-17 ENCOUNTER — Encounter: Payer: Self-pay | Admitting: Internal Medicine

## 2016-04-18 ENCOUNTER — Other Ambulatory Visit: Payer: Self-pay | Admitting: Internal Medicine

## 2016-04-20 ENCOUNTER — Telehealth: Payer: Self-pay | Admitting: Radiology

## 2016-04-20 DIAGNOSIS — R944 Abnormal results of kidney function studies: Secondary | ICD-10-CM

## 2016-04-20 NOTE — Telephone Encounter (Signed)
Done

## 2016-04-20 NOTE — Addendum Note (Signed)
Addended by: [name] on: 04/20/2016 01:50 PM   Modules accepted: Orders

## 2016-04-20 NOTE — Telephone Encounter (Signed)
Pt coming for labs tomorrow, please place future orders. Thank you 

## 2016-04-21 ENCOUNTER — Other Ambulatory Visit (INDEPENDENT_AMBULATORY_CARE_PROVIDER_SITE_OTHER): Payer: 59

## 2016-04-21 ENCOUNTER — Ambulatory Visit: Payer: 59 | Admitting: Family Medicine

## 2016-04-21 DIAGNOSIS — R944 Abnormal results of kidney function studies: Secondary | ICD-10-CM | POA: Diagnosis not present

## 2016-04-21 LAB — BASIC METABOLIC PANEL
BUN: 25 mg/dL (ref 7–25)
CO2: 22 mmol/L (ref 20–31)
Calcium: 9.3 mg/dL (ref 8.6–10.4)
Chloride: 105 mmol/L (ref 98–110)
Creat: 1.31 mg/dL — ABNORMAL HIGH (ref 0.50–0.99)
Glucose, Bld: 89 mg/dL (ref 65–99)
Potassium: 4.5 mmol/L (ref 3.5–5.3)
Sodium: 137 mmol/L (ref 135–146)

## 2016-04-21 NOTE — Addendum Note (Signed)
Addended by: Arby Barrette on: 04/21/2016 03:19 PM   Modules accepted: Orders

## 2016-04-22 ENCOUNTER — Other Ambulatory Visit: Payer: Self-pay | Admitting: Internal Medicine

## 2016-04-22 ENCOUNTER — Encounter: Payer: Self-pay | Admitting: Internal Medicine

## 2016-04-22 DIAGNOSIS — R944 Abnormal results of kidney function studies: Secondary | ICD-10-CM

## 2016-05-09 ENCOUNTER — Other Ambulatory Visit (INDEPENDENT_AMBULATORY_CARE_PROVIDER_SITE_OTHER): Payer: 59

## 2016-05-09 DIAGNOSIS — R944 Abnormal results of kidney function studies: Secondary | ICD-10-CM

## 2016-05-09 LAB — BASIC METABOLIC PANEL WITH GFR
BUN: 20 mg/dL (ref 7–25)
CO2: 27 mmol/L (ref 20–31)
Calcium: 9.8 mg/dL (ref 8.6–10.4)
Chloride: 103 mmol/L (ref 98–110)
Creat: 0.99 mg/dL (ref 0.50–0.99)
GFR, Est African American: 72 mL/min (ref >=60)
GFR, Est Non African American: 62 mL/min (ref >=60)
Glucose, Bld: 102 mg/dL — ABNORMAL HIGH (ref 65–99)
Potassium: 4.6 mmol/L (ref 3.5–5.3)
Sodium: 139 mmol/L (ref 135–146)

## 2016-05-09 LAB — MICROALBUMIN / CREATININE URINE RATIO
Creatinine,U: 35.6 mg/dL
Microalb Creat Ratio: 2 mg/g (ref 0.0–30.0)
Microalb, Ur: <0.7 mg/dL (ref 0.0–1.9)

## 2016-05-11 LAB — PROTEIN ELECTROPHORESIS, SERUM
Albumin ELP: 4.5 g/dL (ref 3.8–4.8)
Alpha-1-Globulin: 0.3 g/dL (ref 0.2–0.3)
Alpha-2-Globulin: 0.7 g/dL (ref 0.5–0.9)
Beta 2: 0.2 g/dL (ref 0.2–0.5)
Beta Globulin: 0.4 g/dL (ref 0.4–0.6)
Gamma Globulin: 0.7 g/dL — ABNORMAL LOW (ref 0.8–1.7)
Total Protein, Serum Electrophoresis: 6.8 g/dL (ref 6.1–8.1)

## 2016-05-14 ENCOUNTER — Encounter: Payer: Self-pay | Admitting: Internal Medicine

## 2016-05-18 ENCOUNTER — Other Ambulatory Visit: Payer: Self-pay | Admitting: Internal Medicine

## 2016-05-18 DIAGNOSIS — I10 Essential (primary) hypertension: Secondary | ICD-10-CM

## 2016-05-18 DIAGNOSIS — R944 Abnormal results of kidney function studies: Secondary | ICD-10-CM

## 2016-05-25 ENCOUNTER — Encounter: Payer: Self-pay | Admitting: Physician Assistant

## 2016-05-31 ENCOUNTER — Other Ambulatory Visit: Payer: Self-pay | Admitting: Internal Medicine

## 2016-06-02 ENCOUNTER — Encounter (HOSPITAL_COMMUNITY): Payer: Self-pay | Admitting: Radiology

## 2016-06-02 ENCOUNTER — Encounter: Payer: Self-pay | Admitting: Physician Assistant

## 2016-06-02 ENCOUNTER — Ambulatory Visit (INDEPENDENT_AMBULATORY_CARE_PROVIDER_SITE_OTHER): Payer: 59 | Admitting: Physician Assistant

## 2016-06-02 VITALS — BP 120/70 | HR 76 | Ht 63.75 in | Wt 115.0 lb

## 2016-06-02 DIAGNOSIS — R194 Change in bowel habit: Secondary | ICD-10-CM | POA: Diagnosis not present

## 2016-06-02 DIAGNOSIS — Z800 Family history of malignant neoplasm of digestive organs: Secondary | ICD-10-CM

## 2016-06-02 DIAGNOSIS — K2940 Chronic atrophic gastritis without bleeding: Secondary | ICD-10-CM

## 2016-06-02 DIAGNOSIS — R1013 Epigastric pain: Secondary | ICD-10-CM | POA: Diagnosis not present

## 2016-06-02 MED ORDER — NA SULFATE-K SULFATE-MG SULF 17.5-3.13-1.6 GM/177ML PO SOLN: 1.0000 | ORAL | 0 refills | Status: DC

## 2016-06-02 MED ORDER — SUCRALFATE 1 G PO TABS: 1.0000 g | ORAL_TABLET | Freq: Three times a day (TID) | ORAL | 2 refills | Status: DC

## 2016-06-02 MED ORDER — PANTOPRAZOLE SODIUM 40 MG PO TBEC: 40.0000 mg | DELAYED_RELEASE_TABLET | Freq: Two times a day (BID) | ORAL | 3 refills | Status: DC

## 2016-06-02 MED ORDER — GLYCOPYRROLATE 2 MG PO TABS: 2.0000 mg | ORAL_TABLET | Freq: Two times a day (BID) | ORAL | 3 refills | Status: DC

## 2016-06-02 NOTE — Progress Notes (Addendum)
Subjective:    Patient ID: Adrienne Robinson, female    DOB: 12/10/1955, 61 y.o.   MRN: [id number]  HPI Adrienne Robinson is a pleasant 61 year old white female, new to GI today, self-referred and requesting to establish with Dr. Hilarie Fredrickson Her PCP is Dr. Derrel Nip. She has had prior endoscopy and colonoscopy in January 2014 done per Dr. Candace Cruise. Patient has history of hypertension, cerebral AVM, GERD, polyarthritis, spinal stenosis and cervical disc disease. She is maintained on chronic narcotics. EGD in 2014 showed moderate esophagitis of the entire esophagus and cytology confirmed [facility]. She was also noted to have a diffuse gastritis biopsies showed chronic gastritis no H. pylori. Colonoscopy done at that same time was a normal exam. Patient comes in today with complaints of significant recent stress and developing abdominal bloating, epigastric pain and pressure. She describes a soreness all over her abdomen. Her mother-in-law [name]  (pt here with cirrhosis) passed away towards the end of March 2018. She had been in and out of the hospital very frequently over the past several months. Patient's husband has also been ill, initially diagnosed with the fluid and then developed DKA and had to be admitted. Shortly after his mother sterile he became ill again and was admitted with a diabetic leg ulcer. He is now at home on IV antibiotics. Patient says she's been under a significant amount of stress. Her appetite is been okay she has not had any nausea or vomiting. Bowels have been "all over the place" but no persistent diarrhea and no melena or hematochezia. She says she feels like she has a brick in her stomach and feels bloated gassy and sore and has burning across her upper abdomen. 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 She has been on meloxicam long-term and also takes baby aspirin 81 mg once daily. She has a prescription for am a Tesio which she uses on an as-needed basis as she generally tends  towards constipation with her chronic narcotic use. She relates family history of colon cancer in her father diagnosed in his 37s paternal grandfather with colon cancer and paternal great-grandmother with colon cancer. She would like to have a colonoscopy and endoscopy.  Review of Systems Pertinent positive and negative review of systems were noted in the above HPI section.  All other review of systems was otherwise negative.  Outpatient Encounter Prescriptions as of 06/02/2016  Medication Sig  . amLODipine (NORVASC) 10 MG tablet TAKE 1 TABLET (10 MG TOTAL) BY MOUTH DAILY.  Marland Kitchen aspirin EC 81 MG tablet Take 81 mg by mouth daily.  . Azelastine-Fluticasone 137-50 MCG/ACT SUSP Place 1 spray into the nose 2 (two) times daily as needed.  Marland Kitchen buPROPion (WELLBUTRIN XL) 150 MG 24 hr tablet TAKE 1 TABLET (300 MG TOTAL) BY MOUTH DAILY.  . cyanocobalamin (,VITAMIN B-12,) 1000 MCG/ML injection Inject 1 mL (1,000 mcg total) into the muscle once a week.  Marland Kitchen HYDROcodone-acetaminophen (NORCO) 7.5-325 MG tablet Take 1 tablet by mouth every 4 (four) hours as needed for moderate pain. May refill on or after  Jul 04 2016  . lubiprostone (AMITIZA) 24 MCG capsule TAKE 1 CAPSULE (24 MCG TOTAL) BY MOUTH 2 (TWO) TIMES DAILY WITH A MEAL.  . MULTIPLE VITAMIN PO Take 1 tablet by mouth daily.  . Probiotic Product (PROBIOTIC PO) Take 1 capsule by mouth daily.  . Syringe/Needle, Disp, (SYRINGE 3CC/25GX1") 25G X 1" 3 ML MISC Use for b12 injections  . Vitamin D, Ergocalciferol, (DRISDOL)  50000 units CAPS capsule Take 1 capsule (50,000 Units total) by mouth every 7 (seven) days.  Marland Kitchen zolpidem (AMBIEN) 10 MG tablet QT BY MOUTH AT BEDTIME AS NEEDED FOR SLEEP  . glycopyrrolate (ROBINUL) 2 MG tablet Take 1 tablet (2 mg total) by mouth 2 (two) times daily.  . Na Sulfate-K Sulfate-Mg Sulf (SUPREP BOWEL PREP KIT) 17.5-3.13-1.6 GM/180ML SOLN Take 1 kit by mouth as directed.  . pantoprazole (PROTONIX) 40 MG tablet Take 1 tablet (40 mg total) by  mouth 2 (two) times daily.  . sucralfate (CARAFATE) 1 g tablet Take 1 tablet (1 g total) by mouth 4 (four) times daily -  with meals and at bedtime.  . [DISCONTINUED] Azelastine-Fluticasone 137-50 MCG/ACT SUSP Place 1 spray into the nose 2 (two) times daily.   No facility-administered encounter medications on file as of 06/02/2016.    Allergies  Allergen Reactions  . Morphine And Related Rash  . Adhesive [Tape] Rash and Other (See Comments)    Redness, blisters, skin peeling off.   Patient Active Problem List   Diagnosis Date Noted  . Acute prerenal azotemia 04/09/2016  . Nonallopathic lesion of lumbosacral region 03/22/2016  . Nonallopathic lesion of sacral region 03/22/2016  . Nonallopathic lesion of thoracic region 03/22/2016  . B12 deficiency 03/19/2016  . Major depressive disorder, single episode, moderate ([place]) 03/19/2016  . History of gout 01/13/2016  . Chronic pain syndrome 11/11/2015  . Constipation due to opioid therapy 05/08/2015  . Cervical neck pain with evidence of disc disease 05/08/2015  . History of oral aphthous ulcers 05/06/2015  . Encounter for preventive health examination 08/08/2014  . Fibromyalgia 01/03/2014  . Generalized anxiety disorder 08/19/2013  . Osteoarthritis of shoulder 06/27/2013  . Polyarthritis of multiple sites ([place]) 04/13/2013  . S/P abdominal hysterectomy 03/04/2013  . Chronic reflux esophagitis 10/29/2012  . Leukopenia 03/05/2012  . AVM (arteriovenous malformation) brain 01/31/2012  . Need for influenza vaccination 01/28/2012  . Ulcerative colitis with rectal bleeding ([place]) 10/05/2011  . Family history of colon cancer 10/04/2011  . Hypertension   . Degenerative joint disease involving multiple joints   . Spinal stenosis of lumbar region    Social History   Social History  . Marital status: Married    Spouse name: N/A  . Number of children: 0  . Years of education: 26   Occupational History  . Nurse executive     Social History  Main Topics  . Smoking status: Never Smoker  . Smokeless tobacco: Never Used  . Alcohol use 1.2 oz/week    2 Glasses of wine per week     Comment: 2-3 week with dinner  . Drug use: No  . Sexual activity: Not on file   Other Topics Concern  . Not on file   Social History Narrative   Lives at home with her husband.   Right-handed.   2-3 diet cokes per day.    Ms. Dietz family history includes Colon cancer in her other and paternal grandfather; Colon cancer (age of onset: 22) in her father; Diabetes in her father; Heart disease in her father; Thyroid disease in her mother.      Objective:    Vitals:   06/02/16 0835  BP: 120/70  Pulse: 76    Physical Exam  well-developed very thin white female in no acute distress, pleasant blood pressure 120/70 pulse 76, height 5 foot 3, weight 1:15, BMI 19.8. HEENT; nontraumatic normocephalic EOMI PERRLA sclera anicteric, Cardiovascular; regular rate and rhythm with  S1-S2 no murmur or gallop, Pulmonary; clear bilaterally, Abdomen; she has some tenderness in the epigastrium and hypogastrium there is no palpable mass or hepatosplenomegaly bowel sounds are present, Rectal; exam not done, Ext; no clubbing cyanosis or edema skin warm dry, Neuropsych ;mood and affect appropriate       Assessment & Plan:   #41 61 year old female with several week history of ongoing upper abdominal discomfort burning bloating gas and cramping. This is in the setting of significant family stress, and chronic meloxicam and aspirin use. The patient has history of diffuse gastritis on EGD 2014. Rule out recurrent acute gastritis or peptic ulcer disease Also suspect  component of IBS  Family history of colon cancer-negative colonoscopy January 2014 #2  change in bowel habits #4 history of Candida esophagitis 2014 #5 spinal stenosis #6 cervical disc disease on chronic narcotics # 7 hypertension  Plan; stopped meloxicam and baby aspirin Start Protonix 40 mg by mouth  twice a day 1 month then every morning Start Carafate 1 g by mouth between meals and at bedtime 1 month Start Robinul Forte 2 mg by mouth twice a day Will schedule for colonoscopy and EGD with Dr. Hilarie Fredrickson. Both procedures discussed in detail with patient including risks and benefits and she is agreeable to proceed. She will continue to use Amitiza 24 g by mouth twice daily as needed   Genia Harold PA-C 06/02/2016   Cc: [name], MD   Addendum: Reviewed and agree with initial management. Jerene Bears, MD

## 2016-06-02 NOTE — Patient Instructions (Signed)
We have sent the following medications to your pharmacy for you to pick up at your convenience:  Protonix 40 mg twice a day x 1 month then every morning.   Carafate 1 gram between meals and at bedtime.   Robinul 2 mg twice a day as needed for cramping/bloating.   Stop Meloxicam.   Stop baby aspirin.   You have been scheduled for an endoscopy and colonoscopy. Please follow the written instructions given to you at your visit today. Please pick up your prep supplies at the pharmacy within the next 1-3 days. If you use inhalers (even only as needed), please bring them with you on the day of your procedure. Your physician has requested that you go to www.startemmi.com and enter the access code given to you at your visit today. This web site gives a general overview about your procedure. However, you should still follow specific instructions given to you by our office regarding your preparation for the procedure.

## 2016-06-06 ENCOUNTER — Encounter: Payer: Self-pay | Admitting: Physician Assistant

## 2016-06-06 ENCOUNTER — Telehealth: Payer: Self-pay | Admitting: Physician Assistant

## 2016-06-06 NOTE — Telephone Encounter (Signed)
Left message. I am asking for her symptoms.

## 2016-06-07 ENCOUNTER — Encounter: Payer: Self-pay | Admitting: Internal Medicine

## 2016-06-07 ENCOUNTER — Other Ambulatory Visit: Payer: Self-pay | Admitting: Internal Medicine

## 2016-06-07 ENCOUNTER — Telehealth: Payer: Self-pay | Admitting: Internal Medicine

## 2016-06-07 MED ORDER — NYSTATIN 100000 UNIT/ML MT SUSP: 5.0000 mL | Freq: Four times a day (QID) | OROMUCOSAL | 0 refills | Status: DC

## 2016-06-07 MED ORDER — FLUCONAZOLE 150 MG PO TABS: 150.0000 mg | ORAL_TABLET | Freq: Every day | ORAL | 0 refills | Status: DC

## 2016-06-07 NOTE — Telephone Encounter (Signed)
Pt would like a call back regarding thrush in mouth. Pt was trying to get in touch with the GI doctor and their phones are down. Please advise?  Call pt @ [phone]. Thank you!

## 2016-06-07 NOTE — Telephone Encounter (Signed)
There was a Mychart message sent from the patient regarding this already today. thanks

## 2016-06-08 NOTE — Telephone Encounter (Signed)
Due to our phone system issues I was unable to reach the patient. She has been successful in reaching her PCP. Her issue has been resolved by PCP.

## 2016-06-12 ENCOUNTER — Encounter: Payer: Self-pay | Admitting: Internal Medicine

## 2016-06-13 ENCOUNTER — Encounter: Payer: Self-pay | Admitting: Physician Assistant

## 2016-06-21 ENCOUNTER — Other Ambulatory Visit: Payer: Self-pay | Admitting: Family Medicine

## 2016-06-22 ENCOUNTER — Other Ambulatory Visit: Payer: Self-pay | Admitting: Internal Medicine

## 2016-06-22 ENCOUNTER — Other Ambulatory Visit (INDEPENDENT_AMBULATORY_CARE_PROVIDER_SITE_OTHER): Payer: 59

## 2016-06-22 DIAGNOSIS — I10 Essential (primary) hypertension: Secondary | ICD-10-CM | POA: Diagnosis not present

## 2016-06-26 ENCOUNTER — Other Ambulatory Visit: Payer: Self-pay | Admitting: Internal Medicine

## 2016-06-26 ENCOUNTER — Encounter: Payer: Self-pay | Admitting: Internal Medicine

## 2016-06-26 NOTE — Telephone Encounter (Signed)
I do not see these results in the pt's chart nor have I received them through fax either.

## 2016-06-28 ENCOUNTER — Encounter: Payer: Self-pay | Admitting: Internal Medicine

## 2016-07-01 ENCOUNTER — Other Ambulatory Visit: Payer: Self-pay | Admitting: Internal Medicine

## 2016-07-03 ENCOUNTER — Other Ambulatory Visit: Payer: Self-pay | Admitting: Internal Medicine

## 2016-07-03 MED ORDER — BUPROPION HCL ER (XL) 150 MG PO TB24: ORAL_TABLET | ORAL | 3 refills | Status: DC

## 2016-07-06 ENCOUNTER — Ambulatory Visit
Admission: RE | Admit: 2016-07-06 | Discharge: 2016-07-06 | Disposition: A | Discharge status: Home / Self Care | Payer: 59 | Source: Ambulatory Visit | Attending: Internal Medicine | Admitting: Internal Medicine

## 2016-07-10 ENCOUNTER — Encounter: Payer: Self-pay | Admitting: Internal Medicine

## 2016-07-10 ENCOUNTER — Ambulatory Visit (AMBULATORY_SURGERY_CENTER): Payer: 59 | Admitting: Internal Medicine

## 2016-07-10 VITALS — BP 116/77 | HR 75 | Temp 98.4°F | Resp 16 | Ht 63.75 in | Wt 115.0 lb

## 2016-07-10 DIAGNOSIS — Z800 Family history of malignant neoplasm of digestive organs: Secondary | ICD-10-CM

## 2016-07-10 DIAGNOSIS — R194 Change in bowel habit: Secondary | ICD-10-CM

## 2016-07-10 DIAGNOSIS — K3189 Other diseases of stomach and duodenum: Secondary | ICD-10-CM | POA: Diagnosis not present

## 2016-07-10 DIAGNOSIS — D123 Benign neoplasm of transverse colon: Secondary | ICD-10-CM | POA: Diagnosis not present

## 2016-07-10 DIAGNOSIS — R1013 Epigastric pain: Secondary | ICD-10-CM | POA: Diagnosis present

## 2016-07-10 DIAGNOSIS — R140 Abdominal distension (gaseous): Secondary | ICD-10-CM

## 2016-07-10 MED ORDER — SODIUM CHLORIDE 0.9 % IV SOLN: 500.0000 mL | INTRAVENOUS | Status: DC

## 2016-07-10 MED ORDER — LINACLOTIDE 290 MCG PO CAPS: 290.0000 ug | ORAL_CAPSULE | Freq: Every day | ORAL | 3 refills | Status: DC

## 2016-07-10 NOTE — Patient Instructions (Addendum)
Handout given : Polyps and Low FODMAP Diet.  Office visit, next available.   YOU HAD AN ENDOSCOPIC PROCEDURE TODAY AT [facility]:   Refer to the procedure report that was given to you for any specific questions about what was found during the examination.  If the procedure report does not answer your questions, please call your gastroenterologist to clarify.  If you requested that your care partner not be given the details of your procedure findings, then the procedure report has been included in a sealed envelope for you to review at your convenience later.  YOU SHOULD EXPECT: Some feelings of bloating in the abdomen. Passage of more gas than usual.  Walking can help get rid of the air that was put into your GI tract during the procedure and reduce the bloating. If you had a lower endoscopy (such as a colonoscopy or flexible sigmoidoscopy) you may notice spotting of blood in your stool or on the toilet paper. If you underwent a bowel prep for your procedure, you may not have a normal bowel movement for a few days.  Please Note:  You might notice some irritation and congestion in your nose or some drainage.  This is from the oxygen used during your procedure.  There is no need for concern and it should clear up in a day or so.  SYMPTOMS TO REPORT IMMEDIATELY:   Following lower endoscopy (colonoscopy or flexible sigmoidoscopy):  Excessive amounts of blood in the stool  Significant tenderness or worsening of abdominal pains  Swelling of the abdomen that is new, acute  Fever of 100F or higher   Following upper endoscopy (EGD)  Vomiting of blood or coffee ground material  New chest pain or pain under the shoulder blades  Painful or persistently difficult swallowing  New shortness of breath  Fever of 100F or higher  Black, tarry-looking stools  For urgent or emergent issues, a gastroenterologist can be reached at any hour by calling [phone].   DIET:  We do  recommend a small meal at first, but then you may proceed to your regular diet.  Drink plenty of fluids but you should avoid alcoholic beverages for 24 hours.  ACTIVITY:  You should plan to take it easy for the rest of today and you should NOT DRIVE or use heavy machinery until tomorrow (because of the sedation medicines used during the test).    FOLLOW UP: Our staff will call the number listed on your records the next business day following your procedure to check on you and address any questions or concerns that you may have regarding the information given to you following your procedure. If we do not reach you, we will leave a message.  However, if you are feeling well and you are not experiencing any problems, there is no need to return our call.  We will assume that you have returned to your regular daily activities without incident.  If any biopsies were taken you will be contacted by phone or by letter within the next 1-3 weeks.  Please call us at [phone] if you have not heard about the biopsies in 3 weeks.    SIGNATURES/CONFIDENTIALITY: You and/or your care partner have signed paperwork which will be entered into your electronic medical record.  These signatures attest to the fact that that the information above on your After Visit Summary has been reviewed and is understood.  Full responsibility of the confidentiality of this discharge information lies with you  and/or your care-partner. 

## 2016-07-10 NOTE — Progress Notes (Signed)
To recovery, report to Tyrell, RN, VSS. 

## 2016-07-10 NOTE — Op Note (Addendum)
[facility] Patient Name: Trishna Cwik Procedure Date: 07/10/2016 2:08 PM MRN: [id number] Endoscopist: Jerene Bears , MD Age: 60 Referring MD:  Date of Birth: Jul 04, 1955 Gender: Female Account #: [id number] Procedure:                Colonoscopy Indications:              Epigastric abdominal pain, abdominal bloating,                            Family history of colon cancer Medicines:                Monitored Anesthesia Care Procedure:                Pre-Anesthesia Assessment:                           - Prior to the procedure, a History and Physical                            was performed, and patient medications and                            allergies were reviewed. The patient's tolerance of                            previous anesthesia was also reviewed. The risks                            and benefits of the procedure and the sedation                            options and risks were discussed with the patient.                            All questions were answered, and informed consent                            was obtained. Prior Anticoagulants: The patient has                            taken no previous anticoagulant or antiplatelet                            agents. ASA Grade Assessment: II - A patient with                            mild systemic disease. After reviewing the risks                            and benefits, the patient was deemed in                            satisfactory condition to undergo the procedure.  After obtaining informed consent, the colonoscope                            was passed under direct vision. Throughout the                            procedure, the patient's blood pressure, pulse, and                            oxygen saturations were monitored continuously. The                            Model PCF-H190DL [id number]) scope was introduced                            through the anus and advanced to  the the cecum,                            identified by appendiceal orifice and ileocecal                            valve. The colonoscopy was performed without                            difficulty. The patient tolerated the procedure                            well. The quality of the bowel preparation was                            good. The ileocecal valve, appendiceal orifice, and                            rectum were photographed. Scope In: 2:22:09 PM Scope Out: 2:39:20 PM Scope Withdrawal Time: 0 hours 11 minutes 14 seconds  Total Procedure Duration: 0 hours 17 minutes 11 seconds  Findings:                 The digital rectal exam was normal.                           A 3 mm polyp was found in the transverse colon. The                            polyp was sessile. The polyp was removed with a                            cold snare. Resection and retrieval were complete.                           Anal papilla(e) were hypertrophied on retroflexed                            views in the rectum.  The exam was otherwise without abnormality. Complications:            No immediate complications. Estimated Blood Loss:     Estimated blood loss was minimal. Impression:               - One 3 mm polyp in the transverse colon, removed                            with a cold snare. Resected and retrieved.                           - Anal papillae were hypertrophied.                           - The examination was otherwise normal. Recommendation:           - Patient has a contact number available for                            emergencies. The signs and symptoms of potential                            delayed complications were discussed with the                            patient. Return to normal activities tomorrow.                            Written discharge instructions were provided to the                            patient.                           - Resume  previous diet.                           - Continue present medications.                           - Await pathology results.                           - Repeat colonoscopy is recommended. The                            colonoscopy date will be determined after pathology                            results from today's exam become available for                            review.                           - Change Amitiza to Linzess 290 mcg daily given  ongoing issues with constipation and burning                            abdominal pain. Office followup and if no                            improvement with Linzess, consider amitriptyline                            for functional dyspepsia/IBS. Jerene Bears, MD 07/10/2016 2:46:25 PM This report has been signed electronically.

## 2016-07-10 NOTE — Progress Notes (Signed)
Called to room to assist during endoscopic procedure.  Patient ID and intended procedure confirmed with present staff. Received instructions for my participation in the procedure from the performing physician.  

## 2016-07-10 NOTE — Op Note (Signed)
[facility] Patient Name: Adrienne Robinson Procedure Date: 07/10/2016 2:09 PM MRN: [id number] Endoscopist: Jerene Bears , MD Age: 61 Referring MD:  Date of Birth: 1956-02-21 Gender: Female Account #: [id number] Procedure:                Upper GI endoscopy Indications:              Epigastric abdominal pain, Abdominal bloating Medicines:                Monitored Anesthesia Care Procedure:                Pre-Anesthesia Assessment:                           - Prior to the procedure, a History and Physical                            was performed, and patient medications and                            allergies were reviewed. The patient's tolerance of                            previous anesthesia was also reviewed. The risks                            and benefits of the procedure and the sedation                            options and risks were discussed with the patient.                            All questions were answered, and informed consent                            was obtained. Prior Anticoagulants: The patient has                            taken no previous anticoagulant or antiplatelet                            agents. ASA Grade Assessment: II - A patient with                            mild systemic disease. After reviewing the risks                            and benefits, the patient was deemed in                            satisfactory condition to undergo the procedure.                           After obtaining informed consent, the endoscope was  passed under direct vision. Throughout the                            procedure, the patient's blood pressure, pulse, and                            oxygen saturations were monitored continuously. The                            Endoscope was introduced through the mouth, and                            advanced to the second part of duodenum. The upper                            GI  endoscopy was accomplished without difficulty.                            The patient tolerated the procedure well. Scope In: Scope Out: Findings:                 The examined esophagus was normal. Z-line regular                            at 38 cm.                           The entire examined stomach was normal. Biopsies                            were taken with a cold forceps for histology and                            Helicobacter pylori testing.                           The examined duodenum was normal. Biopsies for                            histology were taken with a cold forceps for                            evaluation of celiac disease. Complications:            No immediate complications. Estimated Blood Loss:     Estimated blood loss was minimal. Impression:               - Normal esophagus.                           - Normal stomach. Biopsied.                           - Normal examined duodenum. Biopsied. Recommendation:           - Patient has a contact number available for  emergencies. The signs and symptoms of potential                            delayed complications were discussed with the                            patient. Return to normal activities tomorrow.                            Written discharge instructions were provided to the                            patient.                           - Resume previous diet.                           - Continue present medications.                           - Await pathology results.                           - See the other procedure note for documentation of                            additional recommendations. Jerene Bears, MD 07/10/2016 2:43:40 PM This report has been signed electronically.

## 2016-07-10 NOTE — Progress Notes (Signed)
Dr. Hilarie Fredrickson requesting follow up appointment , next available. Ordered Linzess with dc of Amitiza.

## 2016-07-11 ENCOUNTER — Telehealth: Payer: Self-pay

## 2016-07-11 NOTE — Telephone Encounter (Signed)
Left a message for pt at [phone].  Follow up call. maw

## 2016-07-11 NOTE — Telephone Encounter (Signed)
  Follow up Call-  Call back number 07/10/2016  Post procedure Call Back phone  # [phone]  Permission to leave phone message Yes  Some recent data might be hidden     Patient questions:  Do you have a fever, pain , or abdominal swelling? No. Pain Score  0 *  Have you tolerated food without any problems? Yes.    Have you been able to return to your normal activities? Yes.    Do you have any questions about your discharge instructions: Diet   No. Medications  No. Follow up visit  No.  Do you have questions or concerns about your Care? No.  Actions: * If pain score is 4 or above: No action needed, pain <4.  No problems noted per pt. maw

## 2016-07-19 ENCOUNTER — Encounter: Payer: Self-pay | Admitting: Internal Medicine

## 2016-07-19 ENCOUNTER — Ambulatory Visit (INDEPENDENT_AMBULATORY_CARE_PROVIDER_SITE_OTHER): Payer: 59 | Admitting: Internal Medicine

## 2016-07-19 DIAGNOSIS — M48061 Spinal stenosis, lumbar region without neurogenic claudication: Secondary | ICD-10-CM

## 2016-07-19 DIAGNOSIS — E538 Deficiency of other specified B group vitamins: Secondary | ICD-10-CM | POA: Diagnosis not present

## 2016-07-19 DIAGNOSIS — F321 Major depressive disorder, single episode, moderate: Secondary | ICD-10-CM | POA: Diagnosis not present

## 2016-07-19 DIAGNOSIS — M5090 Cervical disc disorder, unspecified, unspecified cervical region: Secondary | ICD-10-CM | POA: Diagnosis not present

## 2016-07-19 DIAGNOSIS — K2100 Gastro-esophageal reflux disease with esophagitis, without bleeding: Secondary | ICD-10-CM

## 2016-07-19 DIAGNOSIS — I10 Essential (primary) hypertension: Secondary | ICD-10-CM | POA: Diagnosis not present

## 2016-07-19 DIAGNOSIS — Z800 Family history of malignant neoplasm of digestive organs: Secondary | ICD-10-CM

## 2016-07-19 DIAGNOSIS — K581 Irritable bowel syndrome with constipation: Secondary | ICD-10-CM | POA: Diagnosis not present

## 2016-07-19 MED ORDER — BUPROPION HCL ER (XL) 300 MG PO TB24: ORAL_TABLET | ORAL | 1 refills | Status: DC

## 2016-07-19 MED ORDER — CELECOXIB 200 MG PO CAPS: 200.0000 mg | ORAL_CAPSULE | Freq: Every day | ORAL | 5 refills | Status: DC

## 2016-07-19 MED ORDER — HYDROCODONE-ACETAMINOPHEN 7.5-325 MG PO TABS: 1.0000 | ORAL_TABLET | ORAL | 0 refills | Status: DC | PRN

## 2016-07-19 NOTE — Progress Notes (Signed)
Subjective:  Patient ID: Adrienne Robinson, female    DOB: 07-Sep-1955  Age: 61 y.o. MRN: [id number]  CC: Diagnoses of Major depressive disorder, single episode, moderate ([place]), Spinal stenosis of lumbar region without neurogenic claudication, Family history of colon cancer, B12 deficiency, Irritable bowel syndrome with constipation, Chronic reflux esophagitis, Cervical neck pain with evidence of disc disease, and Essential hypertension were pertinent to this visit.  HPI LYRAH BRADT presents for follow up on chronic issues including joint pain    Mammogram normal 5/.17 EGD Colonoscopy  5/23. One 3 mm tubular adenoma.    Reactive gastropathy    IBS  DIAGNOSED.   Linzess prescribed (Pyrtle) i Hypertension:  Recent workup for secondary causes due to decreased GFR  on ACE Inhibtor. No evidence of  RAS, and no  proteinuria   , repeat cr 0.99 march OFF OF LISINOPRIL ,  ON MELOXICAM   Depression;  She is requesting that she reduce her wellbutrin dose  To 300 MG  Because it is making her tremulous.  Still using ambien for insomnia.    Outpatient Medications Prior to Visit  Medication Sig Dispense Refill  . amLODipine (NORVASC) 10 MG tablet TAKE 1 TABLET (10 MG TOTAL) BY MOUTH DAILY. 90 tablet 3  . aspirin EC 81 MG tablet Take 81 mg by mouth daily.    . Azelastine-Fluticasone 137-50 MCG/ACT SUSP Place 1 spray into the nose 2 (two) times daily as needed.    . cyanocobalamin (,VITAMIN B-12,) 1000 MCG/ML injection Inject 1 mL (1,000 mcg total) into the muscle once a week. 10 mL 5  . linaclotide (LINZESS) 290 MCG CAPS capsule Take 1 capsule (290 mcg total) by mouth daily before breakfast. 30 capsule 3  . meloxicam (MOBIC) 15 MG tablet Take 15 mg by mouth daily.  1  . MULTIPLE VITAMIN PO Take 1 tablet by mouth daily.    . pantoprazole (PROTONIX) 40 MG tablet Take 1 tablet (40 mg total) by mouth 2 (two) times daily. 60 tablet 3  . Probiotic Product (PROBIOTIC PO) Take 1 capsule by mouth daily.    .  Syringe/Needle, Disp, (SYRINGE 3CC/25GX1") 25G X 1" 3 ML MISC Use for b12 injections 50 each 0  . Vitamin D, Ergocalciferol, (DRISDOL) 50000 units CAPS capsule TAKE ONE CAPSULE BY MOUTH EVERY 7 DAYS 12 capsule 0  . zolpidem (AMBIEN) 10 MG tablet QT BY MOUTH AT BEDTIME AS NEEDED FOR SLEEP 30 tablet 5  . buPROPion (WELLBUTRIN XL) 150 MG 24 hr tablet TAKE 1 TABLET (300 MG TOTAL) BY MOUTH DAILY. 30 tablet 3  . HYDROcodone-acetaminophen (NORCO) 7.5-325 MG tablet Take 1 tablet by mouth every 4 (four) hours as needed for moderate pain. May refill on or after  Jul 04 2016 180 tablet 0  . fluconazole (DIFLUCAN) 150 MG tablet Take 1 tablet (150 mg total) by mouth daily. (Patient not taking: Reported on 07/19/2016) 2 tablet 0  . glycopyrrolate (ROBINUL) 2 MG tablet Take 1 tablet (2 mg total) by mouth 2 (two) times daily. (Patient not taking: Reported on 07/19/2016) 60 tablet 3  . lisinopril (PRINIVIL,ZESTRIL) 5 MG tablet TAKE 1 TABLET (5 MG TOTAL) BY MOUTH DAILY. (Patient not taking: Reported on 07/10/2016) 90 tablet 1  . nystatin (MYCOSTATIN) 100000 UNIT/ML suspension Take 5 mLs (500,000 Units total) by mouth 4 (four) times daily. (Patient not taking: Reported on 07/10/2016) 140 mL 0  . sucralfate (CARAFATE) 1 g tablet Take 1 tablet (1 g total) by mouth 4 (four) times  daily -  with meals and at bedtime. (Patient not taking: Reported on 07/19/2016) 120 tablet 2   Facility-Administered Medications Prior to Visit  Medication Dose Route Frequency Provider Last Rate Last Dose  . 0.9 %  sodium chloride infusion  500 mL Intravenous Continuous Pyrtle, Lajuan Lines, MD        Review of Systems;  Patient denies headache, fevers, malaise, unintentional weight loss, skin rash, eye pain, sinus congestion and sinus pain, sore throat, dysphagia,  hemoptysis , cough, dyspnea, wheezing, chest pain, palpitations, orthopnea, edema, abdominal pain, nausea, melena, diarrhea, constipation, flank pain, dysuria, hematuria, urinary  Frequency,  nocturia, numbness, tingling, seizures,  Focal weakness, Loss of consciousness,  Tremor, insomnia, depression, anxiety, and suicidal ideation.      Objective:  BP 132/86 (BP Location: Left Arm, Patient Position: Sitting, Cuff Size: Normal)   Pulse 82   Temp 98.1 F (36.7 C) (Oral)   Resp 15   Ht 5' 3.75" (1.619 m)   Wt 115 lb (52.2 kg)   SpO2 97%   BMI 19.89 kg/m   BP Readings from Last 3 Encounters:  07/19/16 132/86  07/10/16 116/77  06/02/16 120/70    Wt Readings from Last 3 Encounters:  07/19/16 115 lb (52.2 kg)  07/10/16 115 lb (52.2 kg)  06/02/16 115 lb (52.2 kg)    General appearance: alert, cooperative and appears stated age Ears: normal TM's and external ear canals both ears Throat: lips, mucosa, and tongue normal; teeth and gums normal Neck: no adenopathy, no carotid bruit, supple, symmetrical, trachea midline and thyroid not enlarged, symmetric, no tenderness/mass/nodules Back: symmetric, no curvature. ROM normal. No CVA tenderness. Lungs: clear to auscultation bilaterally Heart: regular rate and rhythm, S1, S2 normal, no murmur, click, rub or gallop Abdomen: soft, non-tender; bowel sounds normal; no masses,  no organomegaly Pulses: 2+ and symmetric Skin: Skin color, texture, turgor normal. No rashes or lesions Lymph nodes: Cervical, supraclavicular, and axillary nodes normal.  Lab Results  Component Value Date   HGBA1C 5.7 08/04/2011    Lab Results  Component Value Date   CREATININE 0.99 05/09/2016   CREATININE 1.31 (H) 04/21/2016   CREATININE 1.35 (H) 04/07/2016    Lab Results  Component Value Date   WBC 4.8 03/04/2015   HGB 10.9 (L) 03/04/2015   HCT 32.1 (L) 03/04/2015   PLT 291 03/04/2015   GLUCOSE 102 (H) 05/09/2016   CHOL 216 (H) 08/06/2014   TRIG 101.0 08/06/2014   HDL 63.10 08/06/2014   LDLDIRECT 160.2 04/11/2013   LDLCALC 133 (H) 08/06/2014   ALT 16 01/11/2016   AST 14 01/11/2016   NA 139 05/09/2016   K 4.6 05/09/2016   CL 103  05/09/2016   CREATININE 0.99 05/09/2016   BUN 20 05/09/2016   CO2 27 05/09/2016   TSH 0.72 08/06/2014   INR 0.94 03/04/2015   HGBA1C 5.7 08/04/2011   MICROALBUR <0.7 05/09/2016    Mm Screening Breast Tomo Bilateral  Result Date: 07/06/2016 CLINICAL DATA:  Screening. EXAM: 2D DIGITAL SCREENING BILATERAL MAMMOGRAM WITH CAD AND ADJUNCT TOMO COMPARISON:  Previous exam(s). ACR Breast Density Category c: The breast tissue is heterogeneously dense, which may obscure small masses. FINDINGS: There are no findings suspicious for malignancy. Images were processed with CAD. IMPRESSION: No mammographic evidence of malignancy. A result letter of this screening mammogram will be mailed directly to the patient. RECOMMENDATION: Screening mammogram in one year. (Code:SM-B-01Y) BI-RADS CATEGORY  1: Negative. Electronically Signed   By: Linwood Dibbles.D.  On: 07/06/2016 16:56    Assessment & Plan:   Problem List Items Addressed This Visit    Spinal stenosis of lumbar region    She has chronic back pain Complicated by scoliosis. She is unable to sit for prolonged periods of time without increased pain.  She uses a standing desk at home. She continues to require 6 vicodin daily to manage her pain .  Refill history confirmed via [place] Controlled Substance databas, accessed by me today..REfills for 3 months given       Major depressive disorder, single episode, moderate ([facility])    Reducing dose of wellbutrin today due to intolerance of higher dose.       Relevant Medications   buPROPion (WELLBUTRIN XL) 300 MG 24 hr tablet   IBS (irritable bowel syndrome)    Per GI,  Secondary to family stressors (death of mother in law,  Husband's illness)..  Treating with linzess      Hypertension    Lisinopril stopped due to decline in GFr.  RAS ruled out with doppler ultrasound,  Continue amlodipine.  Lab Results  Component Value Date   CREATININE 0.99 05/09/2016   Lab Results  Component Value Date   NA 139  05/09/2016   K 4.6 05/09/2016   CL 103 05/09/2016   CO2 27 05/09/2016         Family history of colon cancer    Tubular adenoma 3 mm by May 2018 .        Chronic reflux esophagitis    Repeat EGD done may 2018, reactive gastropathy noted,  No Barrett's      Cervical neck pain with evidence of disc disease    She is scheduled to have ESI injections. Changing meloxicam to celebrex for daily use.       Relevant Medications   celecoxib (CELEBREX) 200 MG capsule   HYDROcodone-acetaminophen (NORCO) 7.5-325 MG tablet   B12 deficiency    Likely secondary to reactive gastropathy by EGD . continue weekly injections        A total of 25 minutes of face to face time was spent with patient more than half of which was spent in counselling and coordination of care    I have discontinued Ms. Anker's HYDROcodone-acetaminophen, sucralfate, glycopyrrolate, fluconazole, nystatin, lisinopril, and HYDROcodone-acetaminophen. I have also changed her buPROPion and HYDROcodone-acetaminophen. Additionally, I am having her start on celecoxib. Lastly, I am having her maintain her aspirin EC, zolpidem, cyanocobalamin, SYRINGE 3CC/25GX1", amLODipine, MULTIPLE VITAMIN PO, Probiotic Product (PROBIOTIC PO), Azelastine-Fluticasone, pantoprazole, Vitamin D (Ergocalciferol), meloxicam, and linaclotide. We will continue to administer sodium chloride.  Meds ordered this encounter  Medications  . celecoxib (CELEBREX) 200 MG capsule    Sig: Take 1 capsule (200 mg total) by mouth daily.    Dispense:  30 capsule    Refill:  5  . buPROPion (WELLBUTRIN XL) 300 MG 24 hr tablet    Sig: TAKE 1 TABLET (300 MG TOTAL) BY MOUTH DAILY.    Dispense:  90 tablet    Refill:  1  . DISCONTD: HYDROcodone-acetaminophen (NORCO) 7.5-325 MG tablet    Sig: Take 1 tablet by mouth every 4 (four) hours as needed for moderate pain. May refill on or after August 04 2016    Dispense:  180 tablet    Refill:  0  . DISCONTD:  HYDROcodone-acetaminophen (NORCO) 7.5-325 MG tablet    Sig: Take 1 tablet by mouth every 4 (four) hours as needed for moderate pain. May refill on  or after September 03 2016    Dispense:  180 tablet    Refill:  0  . HYDROcodone-acetaminophen (NORCO) 7.5-325 MG tablet    Sig: Take 1 tablet by mouth every 4 (four) hours as needed for moderate pain. May refill on or after October 04 2016    Dispense:  180 tablet    Refill:  0    Medications Discontinued During This Encounter  Medication Reason  . fluconazole (DIFLUCAN) 150 MG tablet Therapy completed  . glycopyrrolate (ROBINUL) 2 MG tablet Patient has not taken in last 30 days  . lisinopril (PRINIVIL,ZESTRIL) 5 MG tablet Patient has not taken in last 30 days  . nystatin (MYCOSTATIN) 100000 UNIT/ML suspension Patient has not taken in last 30 days  . sucralfate (CARAFATE) 1 g tablet Patient has not taken in last 30 days  . buPROPion (WELLBUTRIN XL) 150 MG 24 hr tablet Reorder  . HYDROcodone-acetaminophen (NORCO) 7.5-325 MG tablet Reorder  . HYDROcodone-acetaminophen (NORCO) 7.5-325 MG tablet Reorder  . HYDROcodone-acetaminophen (NORCO) 7.5-325 MG tablet Reorder    Follow-up: No Follow-up on file.   [name], MD

## 2016-07-19 NOTE — Patient Instructions (Signed)
I have reduced the wellbutrin dose to 300 mg daily and sent new rx to [facility]  Refills for June , July and August hydrocodone given  Trial of celebrex instead of meloxicam for your  Once daily NSAID (more selective ,  Less GI symptoms)  NO changes to BP regimen  Repeat fasting labs in September

## 2016-07-20 ENCOUNTER — Encounter: Payer: Self-pay | Admitting: Internal Medicine

## 2016-07-21 ENCOUNTER — Encounter: Payer: Self-pay | Admitting: Internal Medicine

## 2016-07-22 DIAGNOSIS — K589 Irritable bowel syndrome without diarrhea: Secondary | ICD-10-CM | POA: Insufficient documentation

## 2016-07-22 NOTE — Assessment & Plan Note (Signed)
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 continue weekly injections

## 2016-07-22 NOTE — Assessment & Plan Note (Signed)
Repeat EGD done may 2018, reactive gastropathy noted,  No Barrett's

## 2016-07-22 NOTE — Assessment & Plan Note (Signed)
Reducing dose of wellbutrin today due to intolerance of higher dose.

## 2016-07-22 NOTE — Assessment & Plan Note (Signed)
She is scheduled to have ESI injections. Changing meloxicam to celebrex for daily use.

## 2016-07-22 NOTE — Assessment & Plan Note (Signed)
Tubular adenoma 3 mm by May 2018 .

## 2016-07-22 NOTE — Assessment & Plan Note (Signed)
She has chronic back pain Complicated by scoliosis. She is unable to sit for prolonged periods of time without increased pain.  She uses a standing desk at home. She continues to require 6 vicodin daily to manage her pain .  Refill history confirmed via [place] Controlled Substance databas, accessed by me today..REfills for 3 months given

## 2016-07-22 NOTE — Assessment & Plan Note (Signed)
Lisinopril stopped due to decline in GFr.  RAS ruled out with doppler ultrasound,  Continue amlodipine.  Lab Results  Component Value Date   CREATININE 0.99 05/09/2016   Lab Results  Component Value Date   NA 139 05/09/2016   K 4.6 05/09/2016   CL 103 05/09/2016   CO2 27 05/09/2016

## 2016-07-22 NOTE — Assessment & Plan Note (Signed)
Per GI,  Secondary to family stressors (death of mother in law,  Husband's illness)..  Treating with linzess

## 2016-07-26 ENCOUNTER — Other Ambulatory Visit: Payer: Self-pay | Admitting: Family Medicine

## 2016-07-26 NOTE — Telephone Encounter (Signed)
Refill done.  

## 2016-08-02 ENCOUNTER — Encounter: Payer: Self-pay | Admitting: Internal Medicine

## 2016-08-07 ENCOUNTER — Telehealth: Payer: Self-pay | Admitting: Internal Medicine

## 2016-08-07 NOTE — Telephone Encounter (Signed)
LMTCB. Need to schedule pt an appt for a pap smear at just one of her next available appt slots.

## 2016-08-07 NOTE — Telephone Encounter (Signed)
Appointment Request From: Kermit Balo    With Provider: [name], MD [facility] [place]]    Preferred Date Range: Any date 08/07/2016 or later    Preferred Times: Any    Reason: To address the following health maintenance concerns.  Pap Smear    Comments:  TT ..    I have had a TAH (1989) as you and I have discussed. I don't think I need a pap smear. Should we be concerned about anything else gynecologically? CA wise? I'll be happy to be tested for anything you feel is necessary ...    Eustace Pen

## 2016-08-07 NOTE — Telephone Encounter (Signed)
Please advise 

## 2016-08-07 NOTE — Telephone Encounter (Signed)
This is not urgent.  Do not use my urgent  slots.

## 2016-08-08 NOTE — Telephone Encounter (Signed)
Spoke with pt and she stated that she does not need a pap smear because she had a total abdominal hysterectomy in 1989. The pt was just asking about why it kept popping up on her my chart as over due. I went in the pt's chart and did an exclusion in the health maintenance section so that it will never come due again.

## 2016-08-12 ENCOUNTER — Other Ambulatory Visit: Payer: Self-pay | Admitting: Internal Medicine

## 2016-08-12 MED ORDER — MIRTAZAPINE 15 MG PO TABS
15.0000 mg | ORAL_TABLET | Freq: Every day | ORAL | 2 refills | Status: DC
Start: 2016-08-12 — End: 2016-09-29

## 2016-08-14 ENCOUNTER — Other Ambulatory Visit: Payer: Self-pay | Admitting: Internal Medicine

## 2016-08-16 ENCOUNTER — Telehealth (HOSPITAL_COMMUNITY): Payer: Self-pay

## 2016-08-16 NOTE — Telephone Encounter (Signed)
Pt agreed to f/u with mri/mra in 6 months. AW

## 2016-08-22 ENCOUNTER — Other Ambulatory Visit: Payer: Self-pay | Admitting: *Deleted

## 2016-08-22 ENCOUNTER — Encounter: Payer: Self-pay | Admitting: *Deleted

## 2016-08-22 MED ORDER — PANTOPRAZOLE SODIUM 40 MG PO TBEC: 40.0000 mg | DELAYED_RELEASE_TABLET | Freq: Two times a day (BID) | ORAL | 3 refills | Status: DC

## 2016-08-26 ENCOUNTER — Other Ambulatory Visit: Payer: Self-pay | Admitting: Internal Medicine

## 2016-08-30 ENCOUNTER — Encounter: Payer: Self-pay | Admitting: Internal Medicine

## 2016-09-04 ENCOUNTER — Ambulatory Visit: Payer: 59 | Admitting: Internal Medicine

## 2016-09-19 ENCOUNTER — Encounter: Payer: Self-pay | Admitting: Family Medicine

## 2016-09-29 ENCOUNTER — Encounter: Payer: Self-pay | Admitting: Family Medicine

## 2016-09-29 ENCOUNTER — Ambulatory Visit (INDEPENDENT_AMBULATORY_CARE_PROVIDER_SITE_OTHER): Payer: 59 | Admitting: Family Medicine

## 2016-09-29 VITALS — BP 130/84 | HR 79 | Ht 66.0 in

## 2016-09-29 DIAGNOSIS — M999 Biomechanical lesion, unspecified: Secondary | ICD-10-CM | POA: Diagnosis not present

## 2016-09-29 DIAGNOSIS — M797 Fibromyalgia: Secondary | ICD-10-CM

## 2016-09-29 DIAGNOSIS — M48061 Spinal stenosis, lumbar region without neurogenic claudication: Secondary | ICD-10-CM | POA: Diagnosis not present

## 2016-09-29 MED ORDER — VITAMIN D (ERGOCALCIFEROL) 1.25 MG (50000 UNIT) PO CAPS: ORAL_CAPSULE | ORAL | 0 refills | Status: DC

## 2016-09-29 NOTE — Patient Instructions (Signed)
Good to see you  Injected the thumb today  Exercises for the upper back 3 times a week  pennsaid pinkie amount topically 2 times daily as needed.   Once weekly vitamin D  See me again in 4 weeks.

## 2016-09-29 NOTE — Progress Notes (Signed)
[name] [place], [place] [address] Phone: [phone] Subjective:    I'm seeing this patient by the request  of:  [name], MD   CC: Back pain f/u  [id number]  Adrienne Robinson is a 61 y.o. female coming in with complaint of low back pain. 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 Patient states that she is having more back and neck pain. Has seen multiple providers for this previously. Has seen neurology as well.   Patient did see me previously and we did attempt osteopathic manipulation. Because patient though had some difficulty with family and other issues patient is to return in quite some time. Starting have increasing discomfort again. Patient states that can affect her daily activity. Patient states waking her up at night.patient denies any radiation down her legs. Seems to be more in the left upper thigh right lower back.     Past Medical History:  Diagnosis Date  . Allergy   . Arthritis   . 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 B12 deficiency 01/2016  . Candida esophagitis ([place])   . Degenerative joint disease involving multiple joints   . Depression   . Dizziness   . Hypertension   . Pneumonia    approx 6 years ago  . Scoliosis   . Spinal stenosis of lumbar region   . Tubular adenoma of colon    Past Surgical History:  Procedure Laterality Date  . ABDOMINAL HYSTERECTOMY    . APPENDECTOMY    . CARDIAC CATHETERIZATION     normal coronaries, no wall motion abnormalities 11/02/09 [facility])  . NASAL SINUS SURGERY    . OOPHORECTOMY    . RADIOLOGY WITH ANESTHESIA  02/26/2012   Procedure: RADIOLOGY WITH ANESTHESIA;  Surgeon: [name], MD;  Location: [place];  Service: Radiology;  Laterality: N/A;  . REVERSE SHOULDER ARTHROPLASTY Right 06/27/2013   Procedure: REVERSE RIGHT TOTAL  SHOULDER ARTHROPLASTY;  Surgeon: [name], MD;  Location: [place];  Service: Orthopedics;  Laterality: Right;  . SHOULDER ARTHROSCOPY WITH ROTATOR CUFF REPAIR AND SUBACROMIAL DECOMPRESSION  2007   left  . TONSILLECTOMY  adnoids   Social History   Social History  . Marital status: Married    Spouse name: N/A  . Number of children: 0  . Years of education: 35   Occupational History  . Nurse executive     Social History Main Topics  . Smoking status: Never Smoker  . Smokeless tobacco: Never Used  . Alcohol use 1.2 oz/week    2 Glasses of wine per week     Comment: 2-3 week with dinner  . Drug use: No  . Sexual activity: Not Asked   Other Topics Concern  . None   Social History Narrative   Lives at home with her husband.   Right-handed.   2-3 diet cokes per day.   Allergies  Allergen Reactions  . Morphine And Related Rash  . Adhesive [Tape] Rash and Other (See Comments)    Redness, blisters, skin peeling off.   Family History  Problem Relation Age of Onset  . Colon cancer Father 86  . Diabetes Father   . Heart disease Father   . Rectal cancer Father   . Thyroid disease Mother   . Colon cancer Paternal Grandfather   . Colon cancer  Other        pggm  . Breast cancer Paternal Grandmother   . Stomach cancer Neg Hx     Past medical history, social, surgical and family history all reviewed in electronic medical record.  No pertanent information unless stated regarding to the chief complaint.   Review of Systems: No headache, visual changes, nausea, vomiting, diarrhea, constipation, dizziness, abdominal pain, skin rash, fevers, chills, night sweats, weight loss, swollen lymph nodes, body aches, joint swelling, muscle aches, chest pain, shortness of breath, mood changes.    Objective  Blood pressure 130/84, pulse 79, height 5\' 6"  (1.676 m), SpO2 96 %.   General: NAD A&O x3 mood, affect normal  HEENT: Pupils equal, extraocular movements intact no  nystagmus Respiratory: not short of breath at rest or with speaking Cardiovascular: No lower extremity edema, non tender Skin: Warm dry intact with no signs of infection or rash on extremities or on axial skeleton. Abdomen: Soft nontender, no masses Neuro: Cranial nerves  intact, neurovascularly intact in all extremities with 2+ DTRs and 2+ pulses. Lymph: No lymphadenopathy appreciated today  Gait normal with good balance and coordination.  MSK: Non tender with full range of motion and good stability and symmetric strength and tone of shoulders, elbows, wrist,  knee hips and ankles bilaterally. . Hypermobility of multiple joints.rthritic changes as well  Back Exam:  Inspection: coliosis noted Motion: Flexion 45 deg, Extension 25 deg, Side Bending to 45 deg bilaterally,  Rotation to 45 deg bilaterally  SLR laying: Negative  XSLR laying: Negative  Palpable tenderness: ender to palpation in the scapular region on the right side as well as paraspinal musculature of the left side lumbar spine.Marland Kitchen FABER: ositive left. Sensory change: Gross sensation intact to all lumbar and sacral dermatomes.  Reflexes: 2+ at both patellar tendons, 2+ at achilles tendons, Babinski's downgoing.  Strength at foot  Plantar-flexion: 5/5 Dorsi-flexion: 5/5 Eversion: 5/5 Inversion: 5/5  Leg strength  Quad: 5/5 Hamstring: 5/5 Hip flexor: 5/5 Hip abductors: 5/5  Gait unremarkable.   Osteopathic findings Cervical C2 flexed rotated and side bent right C4 flexed rotated and side bent left C6 flexed rotated and side bent left T3 extended rotated and side bent right inhaled third rib T9 extended rotated and side bent left L2 flexed rotated and side bent right Sacrum right on right     Impression and Recommendations:     This case required medical decision making of moderate complexity.      Note: This dictation was prepared with Dragon dictation along with smaller phrase technology. Any transcriptional  errors that result from this process are unintentional.

## 2016-09-30 NOTE — Assessment & Plan Note (Signed)
Decision today to treat with OMT was based on Physical Exam  After verbal consent patient was treated with HVLA, ME, FPR techniques in  thoracic, lumbar and sacral areas  Patient tolerated the procedure well with improvement in symptoms  Patient given exercises, stretches and lifestyle modifications  See medications in patient instructions if given  Patient will follow up in 3-4 weeks 

## 2016-09-30 NOTE — Assessment & Plan Note (Signed)
Patient does have known spinal stenosis. Symptoms to correspond with this. Underlying scoliosis and muscle imbalances is also contributing. Discussed with patient at length. Patient to therapy including home exercises, icing regimen, which activities to do a which ones to avoid. Patient given home exercises again because patient was not doing them secondary to personal reasons. Patient will follow-up with me again in 3-4 weeks.

## 2016-10-02 ENCOUNTER — Other Ambulatory Visit: Payer: Self-pay | Admitting: Internal Medicine

## 2016-10-02 ENCOUNTER — Encounter: Payer: Self-pay | Admitting: Internal Medicine

## 2016-10-02 DIAGNOSIS — R194 Change in bowel habit: Secondary | ICD-10-CM

## 2016-10-02 DIAGNOSIS — R140 Abdominal distension (gaseous): Secondary | ICD-10-CM

## 2016-10-04 ENCOUNTER — Other Ambulatory Visit: Payer: Self-pay | Admitting: Internal Medicine

## 2016-10-04 MED ORDER — ALPRAZOLAM 0.5 MG PO TABS: 0.5000 mg | ORAL_TABLET | Freq: Every day | ORAL | 0 refills | Status: DC | PRN

## 2016-10-06 ENCOUNTER — Other Ambulatory Visit: Payer: Self-pay | Admitting: Internal Medicine

## 2016-10-06 DIAGNOSIS — R194 Change in bowel habit: Secondary | ICD-10-CM

## 2016-10-06 DIAGNOSIS — R140 Abdominal distension (gaseous): Secondary | ICD-10-CM

## 2016-10-12 ENCOUNTER — Telehealth: Payer: Self-pay | Admitting: *Deleted

## 2016-10-12 DIAGNOSIS — D709 Neutropenia, unspecified: Secondary | ICD-10-CM

## 2016-10-12 DIAGNOSIS — I10 Essential (primary) hypertension: Secondary | ICD-10-CM

## 2016-10-12 DIAGNOSIS — E538 Deficiency of other specified B group vitamins: Secondary | ICD-10-CM

## 2016-10-12 DIAGNOSIS — E559 Vitamin D deficiency, unspecified: Secondary | ICD-10-CM

## 2016-10-12 NOTE — Telephone Encounter (Signed)
Pt has lab appt on Friday 8/23. Please place future orders.  Thanks

## 2016-10-13 ENCOUNTER — Other Ambulatory Visit: Payer: 59

## 2016-10-16 ENCOUNTER — Other Ambulatory Visit (INDEPENDENT_AMBULATORY_CARE_PROVIDER_SITE_OTHER): Payer: 59

## 2016-10-16 DIAGNOSIS — I10 Essential (primary) hypertension: Secondary | ICD-10-CM

## 2016-10-16 DIAGNOSIS — E538 Deficiency of other specified B group vitamins: Secondary | ICD-10-CM

## 2016-10-16 DIAGNOSIS — E559 Vitamin D deficiency, unspecified: Secondary | ICD-10-CM | POA: Diagnosis not present

## 2016-10-16 LAB — COMPREHENSIVE METABOLIC PANEL
ALT: 23 U/L (ref 0–35)
AST: 21 U/L (ref 0–37)
Albumin: 4.7 g/dL (ref 3.5–5.2)
Alkaline Phosphatase: 70 U/L (ref 39–117)
BUN: 14 mg/dL (ref 6–23)
CO2: 31 mEq/L (ref 19–32)
Calcium: 10.3 mg/dL (ref 8.4–10.5)
Chloride: 100 mEq/L (ref 96–112)
Creatinine, Ser: 1.17 mg/dL (ref 0.40–1.20)
GFR: 50.02 mL/min — ABNORMAL LOW (ref >60.00)
Glucose, Bld: 109 mg/dL — ABNORMAL HIGH (ref 70–99)
Potassium: 3.5 mEq/L (ref 3.5–5.1)
Sodium: 137 mEq/L (ref 135–145)
Total Bilirubin: 0.6 mg/dL (ref 0.2–1.2)
Total Protein: 7.3 g/dL (ref 6.0–8.3)

## 2016-10-16 LAB — LIPID PANEL
Cholesterol: 245 mg/dL — ABNORMAL HIGH (ref 0–200)
HDL: 68.6 mg/dL (ref >39.00)
LDL Cholesterol: 156 mg/dL — ABNORMAL HIGH (ref 0–99)
NonHDL: 176.81
Total CHOL/HDL Ratio: 4
Triglycerides: 104 mg/dL (ref 0.0–149.0)
VLDL: 20.8 mg/dL (ref 0.0–40.0)

## 2016-10-16 LAB — VITAMIN D 25 HYDROXY (VIT D DEFICIENCY, FRACTURES): VITD: 64.21 ng/mL (ref 30.00–100.00)

## 2016-10-16 LAB — VITAMIN B12: Vitamin B-12: >1500 pg/mL — ABNORMAL HIGH (ref 211–911)

## 2016-10-17 ENCOUNTER — Encounter: Payer: Self-pay | Admitting: Internal Medicine

## 2016-10-18 ENCOUNTER — Ambulatory Visit (INDEPENDENT_AMBULATORY_CARE_PROVIDER_SITE_OTHER): Payer: 59 | Admitting: Internal Medicine

## 2016-10-18 ENCOUNTER — Encounter: Payer: Self-pay | Admitting: Internal Medicine

## 2016-10-18 DIAGNOSIS — K5903 Drug induced constipation: Secondary | ICD-10-CM

## 2016-10-18 DIAGNOSIS — M48061 Spinal stenosis, lumbar region without neurogenic claudication: Secondary | ICD-10-CM

## 2016-10-18 DIAGNOSIS — M130 Polyarthritis, unspecified: Secondary | ICD-10-CM | POA: Diagnosis not present

## 2016-10-18 DIAGNOSIS — F321 Major depressive disorder, single episode, moderate: Secondary | ICD-10-CM | POA: Diagnosis not present

## 2016-10-18 DIAGNOSIS — T402X5A Adverse effect of other opioids, initial encounter: Secondary | ICD-10-CM

## 2016-10-18 DIAGNOSIS — I10 Essential (primary) hypertension: Secondary | ICD-10-CM

## 2016-10-18 MED ORDER — HYDROCODONE-ACETAMINOPHEN 7.5-325 MG PO TABS: 1.0000 | ORAL_TABLET | ORAL | 0 refills | Status: DC | PRN

## 2016-10-18 MED ORDER — TIZANIDINE HCL 2 MG PO CAPS: 2.0000 mg | ORAL_CAPSULE | Freq: Three times a day (TID) | ORAL | 3 refills | Status: DC

## 2016-10-18 MED ORDER — NEBIVOLOL HCL 2.5 MG PO TABS: 2.5000 mg | ORAL_TABLET | Freq: Every day | ORAL | 5 refills | Status: DC

## 2016-10-18 NOTE — Progress Notes (Signed)
Subjective:  Patient ID: Adrienne Robinson, female    DOB: Feb 02, 1956  Age: 61 y.o. MRN: [id number]  CC: Diagnoses of Spinal stenosis of lumbar region without neurogenic claudication, Polyarthritis of multiple sites, Essential hypertension, Major depressive disorder, single episode, moderate ([place]), and Constipation due to opioid therapy were pertinent to this visit.  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 Due to persistnet decline in GFR.  RAS ruled out. With renal US Cr rose from 0.99 in March to 1.17 currently but she was fasting and had not drank any water overnight .  Hyperlipidemia :: reviewed labs.  LDL had risen  from 216 to 245,  LDL 133 to 156.  She is statin intolerant    MDD: intolerant of trintelix, but mood has improved since family health stressors have improved.  She has been using 0.25 mg alprazolam (half  Of the prescribed 0.5 mg tablet) as needed for anxiety during the day, but not daily.  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  She does not combine with alcohol. The recent death of mother in law has added additional stress since husband Adrienne Robinson is fulfilling a promise to mother to care for his stepfather who has dementia and is not visited by other family members,  Even his own children. The patient has very little "couple time" with her husband because of his daily visits to his stepfather,  And is avoiding being home when he is not home by working more.    Averages one  glass of wine with client dinners,  Does not drink at home  Refill history confirmed via [place] Controlled Substance databas, accessed by me today.  Has been having increased back pain since rerturning to [facility] for manipulation,  Right sided of back in  spasm .    Left thumb joint was also injected by Adrienne Robinson for management of swelling and pain  with excellent results.   Outpatient Medications Prior to Visit  Medication Sig Dispense Refill  . ALPRAZolam (XANAX) 0.5 MG tablet Take 1 tablet (0.5 mg total) by mouth daily as needed for anxiety or sleep. 30 tablet 0  . amLODipine (NORVASC) 10 MG tablet TAKE 1 TABLET (10 MG TOTAL) BY MOUTH DAILY. 90 tablet 3  . aspirin EC 81 MG tablet Take 81 mg by mouth daily.    . Azelastine-Fluticasone 137-50 MCG/ACT SUSP Place 1 spray into the nose 2 (two) times daily as needed.    Marland Kitchen buPROPion (WELLBUTRIN XL) 300 MG 24 hr tablet TAKE 1 TABLET (300 MG TOTAL) BY MOUTH DAILY. 90 tablet 1  . celecoxib (CELEBREX) 200 MG capsule Take 1 capsule (200 mg total) by mouth daily. 30 capsule 5  . cyanocobalamin (,VITAMIN B-12,) 1000 MCG/ML injection Inject 1 mL (1,000 mcg total) into the muscle once a week. 10 mL 5  . LINZESS 290 MCG CAPS capsule TAKE 1 CAPSULE (290 MCG TOTAL) BY MOUTH DAILY BEFORE BREAKFAST. 30 capsule 2  . MULTIPLE VITAMIN PO Take 1 tablet by mouth daily.    . pantoprazole (PROTONIX) 40 MG tablet Take 1 tablet (40 mg total) by mouth 2 (two) times daily. 180 tablet 3  . Probiotic Product (PROBIOTIC PO) Take 1 capsule by mouth daily.    Marland Kitchen  Syringe/Needle, Disp, (SYRINGE 3CC/25GX1") 25G X 1" 3 ML MISC Use for b12 injections 50 each 0  . Vitamin D, Ergocalciferol, (DRISDOL) 50000 units CAPS capsule TAKE ONE CAPSULE BY MOUTH EVERY 7 DAYS 12 capsule 0  . zolpidem (AMBIEN) 10 MG tablet TAKE 1 TABLET BY MOUTH AT BEDTIME AS NEEDED FOR SLEEP 30 tablet 1  . HYDROcodone-acetaminophen (NORCO) 7.5-325 MG tablet Take 1 tablet by mouth every 4 (four) hours as needed for moderate pain. May refill on or after October 04 2016 180 tablet 0   Facility-Administered Medications Prior to Visit  Medication Dose Route Frequency Provider Last Rate Last Dose  . 0.9 %  sodium chloride infusion  500 mL Intravenous Continuous  Pyrtle, Lajuan Lines, MD        Review of Systems;  Patient denies headache, fevers, malaise, unintentional weight loss, skin rash, eye pain, sinus congestion and sinus pain, sore throat, dysphagia,  hemoptysis , cough, dyspnea, wheezing, chest pain, palpitations, orthopnea, edema, abdominal pain, nausea, melena, diarrhea, constipation, flank pain, dysuria, hematuria, urinary  Frequency, nocturia, numbness, tingling, seizures,  Focal weakness, Loss of consciousness,  Tremor, insomnia, depression, anxiety, and suicidal ideation.      Objective:  BP 136/84 (BP Location: Left Arm, Patient Position: Sitting, Cuff Size: Normal)   Pulse 74   Temp 98.2 F (36.8 C) (Oral)   Resp 16   Ht 5\' 6"  (1.676 m)   Wt 118 lb 9.6 oz (53.8 kg)   SpO2 98%   BMI 19.14 kg/m   BP Readings from Last 3 Encounters:  10/18/16 136/84  09/29/16 130/84  07/19/16 132/86    Wt Readings from Last 3 Encounters:  10/18/16 118 lb 9.6 oz (53.8 kg)  07/19/16 115 lb (52.2 kg)  07/10/16 115 lb (52.2 kg)    General appearance: alert, cooperative and appears stated age Ears: normal TM's and external ear canals both ears Throat: lips, mucosa, and tongue normal; teeth and gums normal Neck: no adenopathy, no carotid bruit, supple, symmetrical, trachea midline and thyroid not enlarged, symmetric, no tenderness/mass/nodules Back: symmetric, no curvature. ROM normal. No CVA tenderness. Lungs: clear to auscultation bilaterally Heart: regular rate and rhythm, S1, S2 normal, no murmur, click, rub or gallop Abdomen: soft, non-tender; bowel sounds normal; no masses,  no organomegaly Pulses: 2+ and symmetric Skin: Skin color, texture, turgor normal. No rashes or lesions Lymph nodes: Cervical, supraclavicular, and axillary nodes normal.  Lab Results  Component Value Date   HGBA1C 5.7 08/04/2011    Lab Results  Component Value Date   CREATININE 1.17 10/16/2016   CREATININE 0.99 05/09/2016   CREATININE 1.31 (H) 04/21/2016     Lab Results  Component Value Date   WBC 4.8 03/04/2015   HGB 10.9 (L) 03/04/2015   HCT 32.1 (L) 03/04/2015   PLT 291 03/04/2015   GLUCOSE 109 (H) 10/16/2016   CHOL 245 (H) 10/16/2016   TRIG 104.0 10/16/2016   HDL 68.60 10/16/2016   LDLDIRECT 160.2 04/11/2013   LDLCALC 156 (H) 10/16/2016   ALT 23 10/16/2016   AST 21 10/16/2016   NA 137 10/16/2016   K 3.5 10/16/2016   CL 100 10/16/2016   CREATININE 1.17 10/16/2016   BUN 14 10/16/2016   CO2 31 10/16/2016   TSH 0.72 08/06/2014   INR 0.94 03/04/2015   HGBA1C 5.7 08/04/2011   MICROALBUR <0.7 05/09/2016    Mm Screening Breast Tomo Bilateral  Result Date: 07/06/2016 CLINICAL DATA:  Screening. EXAM: 2D DIGITAL SCREENING BILATERAL MAMMOGRAM WITH CAD  AND ADJUNCT TOMO COMPARISON:  Previous exam(s). ACR Breast Density Category c: The breast tissue is heterogeneously dense, which may obscure small masses. FINDINGS: There are no findings suspicious for malignancy. Images were processed with CAD. IMPRESSION: No mammographic evidence of malignancy. A result letter of this screening mammogram will be mailed directly to the patient. RECOMMENDATION: Screening mammogram in one year. (Code:SM-B-01Y) BI-RADS CATEGORY  1: Negative. Electronically Signed   By: Fidela Salisbury M.D.   On: 07/06/2016 16:56    Assessment & Plan:   Problem List Items Addressed This Visit    Constipation due to opioid therapy    Using Linzess, with incomplete results,  On maximal dose.  Discussed adding  Cathartic prn and daily BFL/stool softener.      Hypertension    Not at goal without lisinopril 5 mg.  Continue amlodipine 10 mg adding 2.5 mg nebivolol at night       Relevant Medications   nebivolol (BYSTOLIC) 2.5 MG tablet   Major depressive disorder, single episode, moderate ([facility])    Improved with stabilization of husband's health issues.   continue current wellbutrin dose and prn alprzolam for daytime anxiety. The risks and benefits of benzodiazepine use  were discussed with patient today including excessive sedation leading to respiratory depression,  impaired thinking/driving, and addiction.  Patient was advised to avoid concurrent use with alcohol, to use medication only as needed and not to share with others  .        Polyarthritis of multiple sites    Left thumb joint pain and swelling improved s/p steroid injection by Dr Tamala Julian.  Discussed referral to hand surgeon at the appropriate time       Spinal stenosis of lumbar region    She has chronic back pain Complicated by scoliosis. She is unable to sit for prolonged periods of time without increased pain.  She uses a standing desk at home. She continues to require 6 vicodin daily to manage her pain .  Refill history confirmed via [place] Controlled Substance database, accessed by me today..REfills for 3 months given .  Adding tizanidine for prn evning use given presence of muscle spasm involving the entire right paraspinus muscle region         A total of 40 minutes was spent with patient more than half of which was spent in counseling patient on the above mentioned issues , reviewing and explaining recent labs and imaging studies done, and coordination of care.  I have discontinued Ms. Bouillon's HYDROcodone-acetaminophen and HYDROcodone-acetaminophen. I have also changed her HYDROcodone-acetaminophen. Additionally, I am having her start on tizanidine and nebivolol. Lastly, I am having her maintain her aspirin EC, cyanocobalamin, SYRINGE 3CC/25GX1", amLODipine, MULTIPLE VITAMIN PO, Probiotic Product (PROBIOTIC PO), Azelastine-Fluticasone, celecoxib, buPROPion, pantoprazole, zolpidem, Vitamin D (Ergocalciferol), LINZESS, and ALPRAZolam. We will continue to administer sodium chloride.  Meds ordered this encounter  Medications  . tizanidine (ZANAFLEX) 2 MG capsule    Sig: Take 1 capsule (2 mg total) by mouth 3 (three) times daily. For muscle spasm    Dispense:  30 capsule    Refill:  3  . nebivolol  (BYSTOLIC) 2.5 MG tablet    Sig: Take 1 tablet (2.5 mg total) by mouth daily.    Dispense:  30 tablet    Refill:  5  . DISCONTD: HYDROcodone-acetaminophen (NORCO) 7.5-325 MG tablet    Sig: Take 1 tablet by mouth every 4 (four) hours as needed for moderate pain. May refill on or after Sept 15 2018  Dispense:  180 tablet    Refill:  0  . DISCONTD: HYDROcodone-acetaminophen (NORCO) 7.5-325 MG tablet    Sig: Take 1 tablet by mouth every 4 (four) hours as needed for moderate pain. May refill on or after Dec 04 2016    Dispense:  180 tablet    Refill:  0  . HYDROcodone-acetaminophen (NORCO) 7.5-325 MG tablet    Sig: Take 1 tablet by mouth every 4 (four) hours as needed for moderate pain. May refill on or after Jan 04 2017    Dispense:  180 tablet    Refill:  0    Medications Discontinued During This Encounter  Medication Reason  . HYDROcodone-acetaminophen (NORCO) 7.5-325 MG tablet Reorder  . HYDROcodone-acetaminophen (NORCO) 7.5-325 MG tablet Reorder  . HYDROcodone-acetaminophen (NORCO) 7.5-325 MG tablet Reorder    Follow-up: Return in about 3 months (around 01/18/2017).   [name], MD

## 2016-10-18 NOTE — Patient Instructions (Addendum)
continue linzess,  Ok to add one  Or more of the following  miralax Colace Citrate of magnesium of milk of magnesium    Adding tizanadine as needed for muslce spasm (sedating)   Adding 2.5 mg bystolic for blood pressure. Ok to take at night   Goal bp is  120/70

## 2016-10-19 NOTE — Assessment & Plan Note (Signed)
Using Linzess, with incomplete results,  On maximal dose.  Discussed adding  Cathartic prn and daily BFL/stool softener.

## 2016-10-19 NOTE — Assessment & Plan Note (Signed)
Not at goal without lisinopril 5 mg.  Continue amlodipine 10 mg adding 2.5 mg nebivolol at night

## 2016-10-19 NOTE — Assessment & Plan Note (Addendum)
She has chronic back pain Complicated by scoliosis. She is unable to sit for prolonged periods of time without increased pain.  She uses a standing desk at home. She continues to require 6 vicodin daily to manage her pain .  Refill history confirmed via [place] Controlled Substance database, accessed by me today..REfills for 3 months given .  Adding tizanidine for prn evning use given presence of muscle spasm involving the entire right paraspinus muscle region

## 2016-10-19 NOTE — Assessment & Plan Note (Signed)
Left thumb joint pain and swelling improved s/p steroid injection by Dr Tamala Julian.  Discussed referral to hand surgeon at the appropriate time

## 2016-10-19 NOTE — Progress Notes (Signed)
Adrienne Adrienne Robinson [place], [place] [address] Phone: [phone] Subjective:    I'm seeing this patient by the request  of:  Adrienne Mc, MD   CC: Back pain f/u  [id number]  Adrienne Robinson is a 61 y.o. female coming in with complaint of low back pain. 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 Patient does have chronic narcotics as prescribed by primary care provider. Recently was added a muscle relaxer as well. Patient states Overall doing relatively well. Still having tightness. Has been trying to make changes at work which has been a little bit beneficial. Patient denies any new symptoms.     Past Medical History:  Diagnosis Date  . Allergy   . Arthritis   . 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 B12 deficiency 01/2016  . Candida esophagitis ([place])   . Degenerative joint disease involving multiple joints   . Depression   . Dizziness   . Hypertension   . Pneumonia    approx 6 years ago  . Scoliosis   . Spinal stenosis of lumbar region   . Tubular adenoma of colon    Past Surgical History:  Procedure Laterality Date  . ABDOMINAL HYSTERECTOMY    . APPENDECTOMY    . CARDIAC CATHETERIZATION     normal coronaries, no wall motion abnormalities 11/02/09 [facility])  . NASAL SINUS SURGERY    . OOPHORECTOMY    . RADIOLOGY WITH ANESTHESIA  02/26/2012   Procedure: RADIOLOGY WITH ANESTHESIA;  Surgeon: [name], MD;  Location: [facility];  Service: Radiology;  Laterality: N/A;  . REVERSE SHOULDER ARTHROPLASTY Right 06/27/2013   Procedure: REVERSE RIGHT TOTAL SHOULDER ARTHROPLASTY;  Surgeon: [name], MD;  Location: [facility];  Service: Orthopedics;  Laterality: Right;  . SHOULDER ARTHROSCOPY WITH ROTATOR CUFF REPAIR AND SUBACROMIAL DECOMPRESSION  2007   left  . TONSILLECTOMY  adnoids   Social History    Social History  . Marital status: Married    Spouse name: N/A  . Number of children: 0  . Years of education: 59   Occupational History  . Nurse executive     Social History Main Topics  . Smoking status: Never Smoker  . Smokeless tobacco: Never Used  . Alcohol use 1.2 oz/week    2 Glasses of wine per week     Comment: 2-3 week with dinner  . Drug use: No  . Sexual activity: Not Asked   Other Topics Concern  . None   Social History Narrative   Lives at home with her husband.   Right-handed.   2-3 diet cokes per day.   Allergies  Allergen Reactions  . Morphine And Related Rash  . Adhesive [Tape] Rash and Other (See Comments)    Redness, blisters, skin peeling off.   Family History  Problem Relation Age of Onset  . Colon cancer Father 44  . Diabetes Father   . Heart disease Father   . Rectal cancer Father   . Thyroid disease Mother   . Colon cancer Paternal Grandfather   . Colon cancer Other        pggm  . Breast cancer Paternal Grandmother   . Stomach cancer Neg Hx     Past medical history, social, surgical and family history all reviewed in electronic medical record.  No pertanent information unless  stated regarding to the chief complaint.   Review of Systems: No headache, visual changes, nausea, vomiting, diarrhea, constipation, dizziness, abdominal pain, skin rash, fevers, chills, night sweats, weight loss, swollen lymph nodes, body aches, joint swelling, chest pain, shortness of breath, mood changes.  Positive muscle aches   Objective  Blood pressure 124/80, pulse 69, height 5\' 6"  (1.676 m), weight 118 lb (53.5 kg), SpO2 97 %.   Systems examined below as of 10/20/16 General: NAD A&O x3 mood, affect normal  HEENT: Pupils equal, extraocular movements intact no nystagmus Respiratory: not short of breath at rest or with speaking Cardiovascular: No lower extremity edema, non tender Skin: Warm dry intact with no signs of infection or rash on extremities or  on axial skeleton. Abdomen: Soft nontender, no masses Neuro: Cranial nerves  intact, neurovascularly intact in all extremities with 2+ DTRs and 2+ pulses. Lymph: No lymphadenopathy appreciated today  Gait normal with good balance and coordination.   MSK: Non tender with full range of motion and good stability and symmetric strength and tone of shoulders, elbows, wrist,  knee hips and ankles bilaterally. . Hypermobility of multiple joints.rthritic changes as well  Back Exam:  Inspection: Degenerative scoliosis noted Motion: Flexion 25 deg, Extension 15 deg, Side Bending to 35 deg bilaterally,  Rotation to 35 deg bilaterally  SLR laying: Negative  XSLR laying: Negative  Palpable tenderness: Tender to palpation in the paraspinal musculature lumbar spine.Marland Kitchen FABER: negative. Sensory change: Gross sensation intact to all lumbar and sacral dermatomes.  Reflexes: 2+ at both patellar tendons, 2+ at achilles tendons, Babinski's downgoing.  Strength at foot  Plantar-flexion: 5/5 Dorsi-flexion: 5/5 Eversion: 5/5 Inversion: 5/5  Leg strength  Quad: 5/5 Hamstring: 5/5 Hip flexor: 5/5 Hip abductors: 5/5  Gait unremarkable.   Osteopathic findings C2 flexed rotated and side bent right C6 flexed rotated and side bent left T3 extended rotated and side bent right inhaled third rib T6 extended rotated and side bent left L3 flexed rotated and side bent right Sacrum right on right     Impression and Recommendations:     This case required medical decision making of moderate complexity.      Note: This dictation was prepared with Dragon dictation along with smaller phrase technology. Any transcriptional errors that result from this process are unintentional.

## 2016-10-19 NOTE — Assessment & Plan Note (Addendum)
Improved with stabilization of husband's health issues.   continue current wellbutrin dose and prn alprzolam for daytime anxiety. The risks and benefits of benzodiazepine use were discussed with patient today including excessive sedation leading to respiratory depression,  impaired thinking/driving, and addiction.  Patient was advised to avoid concurrent use with alcohol, to use medication only as needed and not to share with others  .

## 2016-10-20 ENCOUNTER — Encounter: Payer: Self-pay | Admitting: Family Medicine

## 2016-10-20 ENCOUNTER — Ambulatory Visit (INDEPENDENT_AMBULATORY_CARE_PROVIDER_SITE_OTHER): Payer: 59 | Admitting: Family Medicine

## 2016-10-20 VITALS — BP 124/80 | HR 69 | Ht 66.0 in | Wt 118.0 lb

## 2016-10-20 DIAGNOSIS — M999 Biomechanical lesion, unspecified: Secondary | ICD-10-CM

## 2016-10-20 DIAGNOSIS — M48061 Spinal stenosis, lumbar region without neurogenic claudication: Secondary | ICD-10-CM | POA: Diagnosis not present

## 2016-10-20 NOTE — Assessment & Plan Note (Signed)
Still believe that most patient's discomfort and pain can be secondary to the spinal stenosis. We discussed possibly repeating imaging this continues to be difficult. Patient did respond decent to the osteopathic manipulation but did have what appeared to be more of a flare. We will monitor closely. Patient come back and see me again in 4-6 weeks.

## 2016-10-20 NOTE — Patient Instructions (Signed)
Good to see you  Overall doing well  Ice is your friend.  2 tennis ball in a tube sock and lay on them where head meets neck up to 10 minutes at the end of the day  See me again in 4-6 weeks!

## 2016-10-20 NOTE — Assessment & Plan Note (Signed)
Decision today to treat with OMT was based on Physical Exam  After verbal consent patient was treated with  ME, FPR techniques in  thoracic, lumbar and sacral areas  Patient tolerated the procedure well with improvement in symptoms  Patient given exercises, stretches and lifestyle modifications  See medications in patient instructions if given, try and lower amplitude exercises and avoiding high impact.  Patient will follow up in 4-6 weeks

## 2016-10-24 ENCOUNTER — Other Ambulatory Visit: Payer: Self-pay | Admitting: Internal Medicine

## 2016-10-24 ENCOUNTER — Encounter: Payer: Self-pay | Admitting: Internal Medicine

## 2016-11-08 NOTE — Telephone Encounter (Signed)
Mailed unread message to patient. thanks 

## 2016-11-14 ENCOUNTER — Encounter: Payer: Self-pay | Admitting: Internal Medicine

## 2016-11-15 ENCOUNTER — Ambulatory Visit (INDEPENDENT_AMBULATORY_CARE_PROVIDER_SITE_OTHER): Payer: 59

## 2016-11-15 ENCOUNTER — Other Ambulatory Visit: Payer: Self-pay | Admitting: Internal Medicine

## 2016-11-15 DIAGNOSIS — Z23 Encounter for immunization: Secondary | ICD-10-CM

## 2016-11-15 MED ORDER — ALPRAZOLAM 0.25 MG PO TABS: 0.2500 mg | ORAL_TABLET | Freq: Every day | ORAL | 1 refills | Status: DC | PRN

## 2016-11-15 NOTE — Progress Notes (Signed)
Patient comes in for Zoster vaccine and flu injection.  Flu vaccine given in right deltoid.  Zoster vaccine given in left deltoid.Patient tolerated injections well.  Patient was  instructed that Shingrix was two dose series and she will  schedule second vaccine 2 months later .   Patient was educated on side effect of vaccine.

## 2016-11-15 NOTE — Telephone Encounter (Signed)
On 8/15 received a rx for .5mg  of Xanax, #30, please advise if this can be refilled, thanks

## 2016-11-15 NOTE — Telephone Encounter (Signed)
Error

## 2016-11-16 ENCOUNTER — Encounter: Payer: Self-pay | Admitting: Internal Medicine

## 2016-11-17 ENCOUNTER — Ambulatory Visit: Payer: 59 | Admitting: Family Medicine

## 2016-11-20 ENCOUNTER — Ambulatory Visit: Payer: 59 | Admitting: Internal Medicine

## 2016-11-20 NOTE — Telephone Encounter (Signed)
Error

## 2016-11-22 ENCOUNTER — Ambulatory Visit (INDEPENDENT_AMBULATORY_CARE_PROVIDER_SITE_OTHER): Payer: 59 | Admitting: Family Medicine

## 2016-11-22 ENCOUNTER — Encounter: Payer: Self-pay | Admitting: Family Medicine

## 2016-11-22 VITALS — BP 120/74 | HR 64 | Ht 64.0 in | Wt 123.0 lb

## 2016-11-22 DIAGNOSIS — M48062 Spinal stenosis, lumbar region with neurogenic claudication: Secondary | ICD-10-CM | POA: Diagnosis not present

## 2016-11-22 DIAGNOSIS — M999 Biomechanical lesion, unspecified: Secondary | ICD-10-CM

## 2016-11-22 MED ORDER — PREDNISONE 50 MG PO TABS: 50.0000 mg | ORAL_TABLET | Freq: Every day | ORAL | 0 refills | Status: DC

## 2016-11-22 NOTE — Patient Instructions (Signed)
Good to see you  We will keep watching Try prednisone daily for 5 days.  If not a lot better we will discuss MRI.  See me again in 3 weeks.

## 2016-11-22 NOTE — Assessment & Plan Note (Signed)
Decision today to treat with OMT was based on Physical Exam  After verbal consent patient was treated with HVLA, ME, FPR techniques in  thoracic, lumbar and sacral areas  Patient tolerated the procedure well with improvement in symptoms  Patient given exercises, stretches and lifestyle modifications  See medications in patient instructions if given  Patient will follow up in 3-4 weeks 

## 2016-11-22 NOTE — Progress Notes (Signed)
[name] [place], [place] [address] Phone: [phone] Subjective:    I'm seeing this patient by the request  of:  [name], MD   CC: Back pain f/u  HYI:[id number]  Adrienne Robinson is a 61 y.o. female coming in with complaint of low back pain. 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 Patient does have chronic narcotics as prescribed by primary care provider. Recently was added a muscle relaxer as well. Patient continues to have discomfort and pain. States it's more on the right side now. Not having as much radicular symptoms at the moment.     Past Medical History:  Diagnosis Date  . Allergy   . Arthritis   . 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 B12 deficiency 01/2016  . Candida esophagitis ([place])   . Degenerative joint disease involving multiple joints   . Depression   . Dizziness   . Hypertension   . Pneumonia    approx 6 years ago  . Scoliosis   . Spinal stenosis of lumbar region   . Tubular adenoma of colon    Past Surgical History:  Procedure Laterality Date  . ABDOMINAL HYSTERECTOMY    . APPENDECTOMY    . CARDIAC CATHETERIZATION     normal coronaries, no wall motion abnormalities 11/02/09 [facility])  . NASAL SINUS SURGERY    . OOPHORECTOMY    . RADIOLOGY WITH ANESTHESIA  02/26/2012   Procedure: RADIOLOGY WITH ANESTHESIA;  Surgeon: [name], MD;  Location: [facility];  Service: Radiology;  Laterality: N/A;  . REVERSE SHOULDER ARTHROPLASTY Right 06/27/2013   Procedure: REVERSE RIGHT TOTAL SHOULDER ARTHROPLASTY;  Surgeon: [name], MD;  Location: [place];  Service: Orthopedics;  Laterality: Right;  . SHOULDER ARTHROSCOPY WITH ROTATOR CUFF REPAIR AND SUBACROMIAL DECOMPRESSION  2007   left  . TONSILLECTOMY  adnoids   Social History   Social History  . Marital status: Married   Spouse name: N/A  . Number of children: 0  . Years of education: 54   Occupational History  . Nurse executive     Social History Main Topics  . Smoking status: Never Smoker  . Smokeless tobacco: Never Used  . Alcohol use 1.2 oz/week    2 Glasses of wine per week     Comment: 2-3 week with dinner  . Drug use: No  . Sexual activity: Not Asked   Other Topics Concern  . None   Social History Narrative   Lives at home with her husband.   Right-handed.   2-3 diet cokes per day.   Allergies  Allergen Reactions  . Morphine And Related Rash  . Adhesive [Tape] Rash and Other (See Comments)    Redness, blisters, skin peeling off.   Family History  Problem Relation Age of Onset  . Colon cancer Father 56  . Diabetes Father   . Heart disease Father   . Rectal cancer Father   . Thyroid disease Mother   . Colon cancer Paternal Grandfather   . Colon cancer Other        pggm  . Breast cancer Paternal Grandmother   . Stomach cancer Neg Hx     Past medical history, social, surgical and family history all reviewed in electronic medical record.  No pertanent information unless stated regarding to the chief complaint.  Review of Systems: No headache, visual changes, nausea, vomiting, diarrhea, constipation, dizziness, abdominal pain, skin rash, fevers, chills, night sweats, weight loss, swollen lymph nodes, body aches, joint swelling, chest pain, shortness of breath, mood changes.  Positive muscle aches   Objective  Blood pressure 120/74, pulse 64, height 5\' 4"  (1.626 m), weight 123 lb (55.8 kg), SpO2 94 %.   Systems examined below as of 11/22/16 General: NAD A&O x3 mood, affect normal  HEENT: Pupils equal, extraocular movements intact no nystagmus Respiratory: not short of breath at rest or with speaking Cardiovascular: No lower extremity edema, non tender Skin: Warm dry intact with no signs of infection or rash on extremities or on axial skeleton. Abdomen: Soft nontender, no  masses Neuro: Cranial nerves  intact, neurovascularly intact in all extremities with 2+ DTRs and 2+ pulses. Lymph: No lymphadenopathy appreciated today  Gait normal with good balance and coordination.  MSK: Non tender with full range of motion and good stability and symmetric strength and tone of shoulders, elbows, wrist,  knee hips and ankles bilaterally.  Hypermobility still noted of multiple joints and arthritic changes  Back Exam:  Inspection: Degenerative scoliosis noted Motion: Flexion 45 deg, Extension 25 deg, Side Bending to 35 deg bilaterally,  Rotation to 45 deg bilaterally  SLR laying: Negative  XSLR laying: Negative  Palpable tenderness: Tender to palpation and appears palmar musculature lumbar spine right greater than left. FABER: negative. Sensory change: Gross sensation intact to all lumbar and sacral dermatomes.  Reflexes: 2+ at both patellar tendons, 2+ at achilles tendons, Babinski's downgoing.  Strength at foot  Plantar-flexion: 5/5 Dorsi-flexion: 5/5 Eversion: 5/5 Inversion: 5/5  Leg strength  Quad: 5/5 Hamstring: 5/5 Hip flexor: 5/5 Hip abductors: 5/5  Gait unremarkable.  Osteopathic findings T3 extended rotated and side bent right inhaled third rib T9 extended rotated and side bent left L2 flexed rotated and side bent right Sacrum right on right      Impression and Recommendations:     This case required medical decision making of moderate complexity.      Note: This dictation was prepared with Dragon dictation along with smaller phrase technology. Any transcriptional errors that result from this process are unintentional.

## 2016-11-22 NOTE — Assessment & Plan Note (Signed)
Do believe that spinal stenosis is likely contributing to most of her aches and pains. Does have significant other comorbidities that could be contribute to some of the discomfort. Discussed icing regimen. Has been quite some time since patient is having prednisone is having worsening symptoms at the moment. Patient will begin this as well. Follow-up again in 3 weeks.

## 2016-11-28 ENCOUNTER — Encounter: Payer: Self-pay | Admitting: Internal Medicine

## 2016-12-08 ENCOUNTER — Encounter: Payer: Self-pay | Admitting: Family Medicine

## 2016-12-08 ENCOUNTER — Encounter: Payer: Self-pay | Admitting: Internal Medicine

## 2016-12-08 ENCOUNTER — Other Ambulatory Visit: Payer: Self-pay | Admitting: Internal Medicine

## 2016-12-08 ENCOUNTER — Ambulatory Visit (INDEPENDENT_AMBULATORY_CARE_PROVIDER_SITE_OTHER): Payer: 59 | Admitting: Family Medicine

## 2016-12-08 VITALS — BP 112/68 | HR 69 | Temp 98.1°F | Wt 122.5 lb

## 2016-12-08 DIAGNOSIS — J0100 Acute maxillary sinusitis, unspecified: Secondary | ICD-10-CM

## 2016-12-08 DIAGNOSIS — B370 Candidal stomatitis: Secondary | ICD-10-CM | POA: Diagnosis not present

## 2016-12-08 MED ORDER — HYDROCODONE-HOMATROPINE 5-1.5 MG/5ML PO SYRP: 5.0000 mL | ORAL_SOLUTION | Freq: Four times a day (QID) | ORAL | 0 refills | Status: DC | PRN

## 2016-12-08 MED ORDER — ALPRAZOLAM 0.25 MG PO TABS: 0.2500 mg | ORAL_TABLET | Freq: Every day | ORAL | 5 refills | Status: DC | PRN

## 2016-12-08 MED ORDER — NYSTATIN 100000 UNIT/ML MT SUSP: 5.0000 mL | Freq: Four times a day (QID) | OROMUCOSAL | 0 refills | Status: DC

## 2016-12-08 MED ORDER — DOXYCYCLINE HYCLATE 100 MG PO CAPS: 100.0000 mg | ORAL_CAPSULE | Freq: Two times a day (BID) | ORAL | 0 refills | Status: DC

## 2016-12-08 NOTE — Progress Notes (Signed)
Subjective:    Patient ID: Adrienne Robinson, female    DOB: 10/28/1955, 61 y.o.   MRN: [id number]  HPI This is a 61 yo female who presents today with cough x 7 days, productive. Has had sore throat, nasal congestion. Has been been neti pot twice a day and taking Mucinex twice a day. Has been taking Bactrim DS BID x 6 days. Some aches, no fever. Has not been taking anything for cough. No sick contacts, but she flies frequently. Feeling up and down. Mornings worse. Some wheezing earlier this week, no SOB.  Gets oral thrush every 1-2 years, feels like it is coming on now. Also has cold sore on right side of lip.   Past Medical History:  Diagnosis Date  . Allergy   . Arthritis   . 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 B12 deficiency 01/2016  . Candida esophagitis ([place])   . Degenerative joint disease involving multiple joints   . Depression   . Dizziness   . Hypertension   . Pneumonia    approx 6 years ago  . Scoliosis   . Spinal stenosis of lumbar region   . Tubular adenoma of colon    Past Surgical History:  Procedure Laterality Date  . ABDOMINAL HYSTERECTOMY    . APPENDECTOMY    . CARDIAC CATHETERIZATION     normal coronaries, no wall motion abnormalities 11/02/09 [facility])  . NASAL SINUS SURGERY    . OOPHORECTOMY    . RADIOLOGY WITH ANESTHESIA  02/26/2012   Procedure: RADIOLOGY WITH ANESTHESIA;  Surgeon: [name], MD;  Location: [facility];  Service: Radiology;  Laterality: N/A;  . REVERSE SHOULDER ARTHROPLASTY Right 06/27/2013   Procedure: REVERSE RIGHT TOTAL SHOULDER ARTHROPLASTY;  Surgeon: [name], MD;  Location: [facility];  Service: Orthopedics;  Laterality: Right;  . SHOULDER ARTHROSCOPY WITH ROTATOR CUFF REPAIR AND SUBACROMIAL DECOMPRESSION  2007   left  . TONSILLECTOMY  adnoids   Family History  Problem Relation Age of Onset  . Colon cancer Father 85  . Diabetes Father   . Heart disease Father   . Rectal  cancer Father   . Thyroid disease Mother   . Colon cancer Paternal Grandfather   . Colon cancer Other        pggm  . Breast cancer Paternal Grandmother   . Stomach cancer Neg Hx    Social History  Substance Use Topics  . Smoking status: Never Smoker  . Smokeless tobacco: Never Used  . Alcohol use 1.2 oz/week    2 Glasses of wine per week     Comment: 2-3 week with dinner      Review of Systems Per HPI    Objective:   Physical Exam  Constitutional: She is oriented to person, place, and time. She appears well-developed and well-nourished. She appears ill. No distress.  HENT:  Head: Normocephalic and atraumatic.  Right Ear: External ear normal.  Left Ear: External ear normal.  Nose: Mucosal edema and rhinorrhea present.  Mouth/Throat: Mucous membranes are normal. Posterior oropharyngeal erythema present. No oropharyngeal exudate or posterior oropharyngeal edema.  Bilateral TMs dull.  Large amount of post nasal drainage.  Tongue with small amount brown coating.   Eyes: Conjunctivae are normal.  Neck: Neck supple.  Cardiovascular: Normal rate, regular rhythm and normal heart sounds.   Pulmonary/Chest: Effort normal and breath sounds normal.  Lymphadenopathy:    She has no cervical  adenopathy.  Neurological: She is alert and oriented to person, place, and time.  Skin: Skin is warm and dry. She is not diaphoretic.  Psychiatric: She has a normal mood and affect. Her behavior is normal. Judgment and thought content normal.  Vitals reviewed.        BP 112/68 (BP Location: Right Arm, Patient Position: Sitting, Cuff Size: Normal)   Pulse 69   Temp 98.1 F (36.7 C) (Oral)   Wt 122 lb 8 oz (55.6 kg)   SpO2 97%   BMI 21.03 kg/m  Wt Readings from Last 3 Encounters:  12/08/16 122 lb 8 oz (55.6 kg)  11/22/16 123 lb (55.8 kg)  10/20/16 118 lb (53.5 kg)    Assessment & Plan:  1. Oral thrush - nystatin (MYCOSTATIN) 100000 UNIT/ML suspension; Take 5 mLs (500,000 Units  total) by mouth 4 (four) times daily.  Dispense: 60 mL; Refill: 0  2. Acute non-recurrent maxillary sinusitis -Provided written and verbal information regarding diagnosis and treatment. Could be viral, offered wait and see antibiotic, but she would prefer to go ahead and get started -  Patient Instructions  It was a pleasure to meet you  Please continue Mucinex, and neti pot. You can add Afrin nasal spray twice a day for 4 days max and please use fluticosone nasal spray daily.    - doxycycline (VIBRAMYCIN) 100 MG capsule; Take 1 capsule (100 mg total) by mouth 2 (two) times daily.  Dispense: 14 capsule; Refill: 0 - HYDROcodone-homatropine (HYCODAN) 5-1.5 MG/5ML syrup; Take 5 mLs by mouth every 6 (six) hours as needed for cough.  Dispense: 120 mL; Refill: 0   Clarene Reamer, FNP-BC  [facility] at [facility], [facility]  12/08/2016 9:28 AM

## 2016-12-08 NOTE — Telephone Encounter (Signed)
SEND REFILL  NOW ,  LAST MESSAGE WAS NOT FORWARDED TO ME

## 2016-12-08 NOTE — Patient Instructions (Signed)
It was a pleasure to meet you  Please continue Mucinex, and neti pot. You can add Afrin nasal spray twice a day for 4 days max and please use fluticosone nasal spray daily.

## 2016-12-08 NOTE — Telephone Encounter (Signed)
rx has been printed, signed and faxed.  

## 2016-12-08 NOTE — Telephone Encounter (Signed)
Please advise, second request, thanks

## 2016-12-10 ENCOUNTER — Encounter: Payer: Self-pay | Admitting: Family Medicine

## 2016-12-11 ENCOUNTER — Encounter: Payer: Self-pay | Admitting: Family Medicine

## 2016-12-11 ENCOUNTER — Other Ambulatory Visit: Payer: Self-pay | Admitting: Internal Medicine

## 2016-12-11 MED ORDER — FLUCONAZOLE 150 MG PO TABS: 150.0000 mg | ORAL_TABLET | Freq: Every day | ORAL | 0 refills | Status: DC

## 2016-12-11 MED ORDER — PREDNISONE 10 MG PO TABS: ORAL_TABLET | ORAL | 0 refills | Status: DC

## 2016-12-11 NOTE — Telephone Encounter (Signed)
Adrienne Robinson is out of the office today. Will send to patient's PCP.

## 2016-12-11 NOTE — Progress Notes (Signed)
pred 

## 2016-12-13 ENCOUNTER — Ambulatory Visit: Payer: 59 | Admitting: Family

## 2016-12-15 ENCOUNTER — Ambulatory Visit: Payer: 59 | Admitting: Family Medicine

## 2016-12-18 ENCOUNTER — Ambulatory Visit (INDEPENDENT_AMBULATORY_CARE_PROVIDER_SITE_OTHER): Payer: 59 | Admitting: Family Medicine

## 2016-12-18 ENCOUNTER — Encounter: Payer: Self-pay | Admitting: Family Medicine

## 2016-12-18 ENCOUNTER — Other Ambulatory Visit: Payer: Self-pay | Admitting: Internal Medicine

## 2016-12-18 VITALS — BP 138/82 | HR 68 | Temp 97.7°F | Wt 121.2 lb

## 2016-12-18 DIAGNOSIS — K130 Diseases of lips: Secondary | ICD-10-CM

## 2016-12-18 DIAGNOSIS — J3089 Other allergic rhinitis: Secondary | ICD-10-CM | POA: Diagnosis not present

## 2016-12-18 DIAGNOSIS — B370 Candidal stomatitis: Secondary | ICD-10-CM

## 2016-12-18 MED ORDER — TRIAMCINOLONE ACETONIDE 0.025 % EX OINT: 1.0000 "application " | TOPICAL_OINTMENT | Freq: Two times a day (BID) | CUTANEOUS | 0 refills | Status: DC

## 2016-12-18 MED ORDER — FLUTICASONE PROPIONATE 50 MCG/ACT NA SUSP: 2.0000 | Freq: Every day | NASAL | 6 refills | Status: DC

## 2016-12-18 MED ORDER — FLUCONAZOLE 100 MG PO TABS: 100.0000 mg | ORAL_TABLET | Freq: Every day | ORAL | 0 refills | Status: DC

## 2016-12-18 NOTE — Progress Notes (Signed)
Subjective:    Patient ID: HAELEE Robinson, female    DOB: 12-23-1955, 61 y.o.   MRN: [id number]  HPI This is a 61 yo female who presents today with continued sinus pressure and "enlarged lymph nodes." She was seen by me 12/08/16 with sinusitis and was started on doxycycline 100 mg po BID.  She reports that her cough has resolved and her throat feels sore on the left side and her lymph nodes feel swollen. Feels fluid in her ears, some relief with flonase. Clear nasal drainage.     Past Medical History:  Diagnosis Date  . Allergy   . Arthritis   . 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 B12 deficiency 01/2016  . Candida esophagitis ([place])   . Degenerative joint disease involving multiple joints   . Depression   . Dizziness   . Hypertension   . Pneumonia    approx 6 years ago  . Scoliosis   . Spinal stenosis of lumbar region   . Tubular adenoma of colon    Past Surgical History:  Procedure Laterality Date  . ABDOMINAL HYSTERECTOMY    . APPENDECTOMY    . CARDIAC CATHETERIZATION     normal coronaries, no wall motion abnormalities 11/02/09 [facility])  . NASAL SINUS SURGERY    . OOPHORECTOMY    . RADIOLOGY WITH ANESTHESIA  02/26/2012   Procedure: RADIOLOGY WITH ANESTHESIA;  Surgeon: [name], MD;  Location: [facility];  Service: Radiology;  Laterality: N/A;  . REVERSE SHOULDER ARTHROPLASTY Right 06/27/2013   Procedure: REVERSE RIGHT TOTAL SHOULDER ARTHROPLASTY;  Surgeon: [name], MD;  Location: [facility];  Service: Orthopedics;  Laterality: Right;  . SHOULDER ARTHROSCOPY WITH ROTATOR CUFF REPAIR AND SUBACROMIAL DECOMPRESSION  2007   left  . TONSILLECTOMY  adnoids   Family History  Problem Relation Age of Onset  . Colon cancer Father 77  . Diabetes Father   . Heart disease Father   . Rectal cancer Father   . Thyroid disease Mother   . Colon cancer Paternal Grandfather   . Colon cancer Other        pggm  . Breast  cancer Paternal Grandmother   . Stomach cancer Neg Hx    Social History  Substance Use Topics  . Smoking status: Never Smoker  . Smokeless tobacco: Never Used  . Alcohol use 1.2 oz/week    2 Glasses of wine per week     Comment: 2-3 week with dinner        Review of Systems Per HPI    Objective:   Physical Exam  Constitutional: She is oriented to person, place, and time. She appears well-developed and well-nourished. No distress.  HENT:  Head: Normocephalic and atraumatic.  Right Ear: Tympanic membrane, external ear and ear canal normal.  Left Ear: Tympanic membrane, external ear and ear canal normal.  Nose: Mucosal edema present.  Mouth/Throat: Oropharynx is clear and moist.  Left TM dull.  Left side of tongue with white coating.  Right corner of mouth with small fissure.    Eyes: Conjunctivae are normal.  Neck: Normal range of motion. Neck supple.  Cardiovascular: Normal rate, regular rhythm and normal heart sounds.   Pulmonary/Chest: Effort normal and breath sounds normal.  Lymphadenopathy:    She has no cervical adenopathy.  Neurological: She is alert and oriented to person, place, and time.  Skin: Skin is warm and dry. She is not  diaphoretic.  Psychiatric: She has a normal mood and affect. Her behavior is normal. Judgment and thought content normal.  Vitals reviewed.   BP 138/82   Pulse 68   Temp 97.7 F (36.5 C) (Oral)   Wt 121 lb 4 oz (55 kg)   SpO2 98%   BMI 20.81 kg/m  Wt Readings from Last 3 Encounters:  12/18/16 121 lb 4 oz (55 kg)  12/08/16 122 lb 8 oz (55.6 kg)  11/22/16 123 lb (55.8 kg)         Assessment & Plan:  1. Oral thrush - fluconazole (DIFLUCAN) 100 MG tablet; Take 1 tablet (100 mg total) by mouth daily.  Dispense: 7 tablet; Refill: 0  2. Angular cheilitis - triamcinolone (KENALOG) 0.025 % ointment; Apply 1 application topically 2 (two) times daily. For maximum 7 days.  Dispense: 30 g; Refill: 0  3. Allergic rhinitis due to  other allergic trigger, unspecified seasonality - continue neti pot - fluticasone (FLONASE) 50 MCG/ACT nasal spray; Place 2 sprays into both nostrils daily.  Dispense: 16 g; Refill: [place], FNP-BC  [facility]  12/23/2016 12:10 PM

## 2016-12-18 NOTE — Patient Instructions (Signed)
Continue neti pot and flonase  I have sent in 7 days of diflucan and a topical treatment to your pharmacy.

## 2016-12-28 ENCOUNTER — Other Ambulatory Visit: Payer: Self-pay | Admitting: Internal Medicine

## 2016-12-29 ENCOUNTER — Encounter: Payer: Self-pay | Admitting: Internal Medicine

## 2016-12-29 MED ORDER — ZOLPIDEM TARTRATE 10 MG PO TABS
10.0000 mg | ORAL_TABLET | Freq: Every evening | ORAL | 1 refills | Status: DC | PRN
Start: 2016-12-29 — End: 2017-02-26

## 2017-01-02 ENCOUNTER — Encounter: Payer: Self-pay | Admitting: Neurology

## 2017-01-02 ENCOUNTER — Encounter: Payer: Self-pay | Admitting: Internal Medicine

## 2017-01-02 ENCOUNTER — Other Ambulatory Visit: Payer: Self-pay | Admitting: Internal Medicine

## 2017-01-02 DIAGNOSIS — R194 Change in bowel habit: Secondary | ICD-10-CM

## 2017-01-02 DIAGNOSIS — R140 Abdominal distension (gaseous): Secondary | ICD-10-CM

## 2017-01-08 ENCOUNTER — Encounter: Payer: Self-pay | Admitting: Internal Medicine

## 2017-01-10 ENCOUNTER — Ambulatory Visit: Payer: 59 | Admitting: Neurology

## 2017-01-12 ENCOUNTER — Other Ambulatory Visit: Payer: Self-pay | Admitting: Internal Medicine

## 2017-01-12 DIAGNOSIS — R140 Abdominal distension (gaseous): Secondary | ICD-10-CM

## 2017-01-12 DIAGNOSIS — R194 Change in bowel habit: Secondary | ICD-10-CM

## 2017-01-23 ENCOUNTER — Encounter: Payer: Self-pay | Admitting: Internal Medicine

## 2017-01-23 ENCOUNTER — Ambulatory Visit (INDEPENDENT_AMBULATORY_CARE_PROVIDER_SITE_OTHER): Payer: 59 | Admitting: Internal Medicine

## 2017-01-23 DIAGNOSIS — G894 Chronic pain syndrome: Secondary | ICD-10-CM

## 2017-01-23 DIAGNOSIS — Z800 Family history of malignant neoplasm of digestive organs: Secondary | ICD-10-CM | POA: Diagnosis not present

## 2017-01-23 DIAGNOSIS — F418 Other specified anxiety disorders: Secondary | ICD-10-CM | POA: Diagnosis not present

## 2017-01-23 MED ORDER — HYDROCODONE-ACETAMINOPHEN 7.5-325 MG PO TABS: 1.0000 | ORAL_TABLET | ORAL | 0 refills | Status: DC | PRN

## 2017-01-23 MED ORDER — PREDNISONE 10 MG PO TABS: ORAL_TABLET | ORAL | 0 refills | Status: DC

## 2017-01-23 MED ORDER — BUSPIRONE HCL 5 MG PO TABS: 5.0000 mg | ORAL_TABLET | Freq: Three times a day (TID) | ORAL | 1 refills | Status: DC

## 2017-01-23 NOTE — Patient Instructions (Signed)
For your anxiety:  I am prescribing buspirone 5 mg three times daily  We can increase the dose if needed by e mail.  You can reduce to twice daily at higher dose.    Your final  shingrix dose  is due by March 26   Buspirone tablets What is this medicine? BUSPIRONE (byoo SPYE rone) is used to treat anxiety disorders. This medicine may be used for other purposes; ask your health care provider or pharmacist if you have questions. COMMON BRAND NAME(S): BuSpar What should I tell my health care provider before I take this medicine? They need to know if you have any of these conditions: -kidney or liver disease -an unusual or allergic reaction to buspirone, other medicines, foods, dyes, or preservatives -pregnant or trying to get pregnant -breast-feeding How should I use this medicine? Take this medicine by mouth with a glass of water. Follow the directions on the prescription label. You may take this medicine with or without food. To ensure that this medicine always works the same way for you, you should take it either always with or always without food. Take your doses at regular intervals. Do not take your medicine more often than directed. Do not stop taking except on the advice of your doctor or health care professional. Talk to your pediatrician regarding the use of this medicine in children. Special care may be needed. Overdosage: If you think you have taken too much of this medicine contact a poison control center or emergency room at once. NOTE: This medicine is only for you. Do not share this medicine with others. What if I miss a dose? If you miss a dose, take it as soon as you can. If it is almost time for your next dose, take only that dose. Do not take double or extra doses. What may interact with this medicine? Do not take this medicine with any of the following medications: -linezolid -MAOIs like Carbex, Eldepryl, Marplan, Nardil, and Parnate -methylene  blue -procarbazine This medicine may also interact with the following medications: -diazepam -digoxin -diltiazem -erythromycin -grapefruit juice -haloperidol -medicines for mental depression or mood problems -medicines for seizures like carbamazepine, phenobarbital and phenytoin -nefazodone -other medications for anxiety -rifampin -ritonavir -some antifungal medicines like itraconazole, ketoconazole, and voriconazole -verapamil -warfarin This list may not describe all possible interactions. Give your health care provider a list of all the medicines, herbs, non-prescription drugs, or dietary supplements you use. Also tell them if you smoke, drink alcohol, or use illegal drugs. Some items may interact with your medicine. What should I watch for while using this medicine? Visit your doctor or health care professional for regular checks on your progress. It may take 1 to 2 weeks before your anxiety gets better. You may get drowsy or dizzy. Do not drive, use machinery, or do anything that needs mental alertness until you know how this drug affects you. Do not stand or sit up quickly, especially if you are an older patient. This reduces the risk of dizzy or fainting spells. Alcohol can make you more drowsy and dizzy. Avoid alcoholic drinks. What side effects may I notice from receiving this medicine? Side effects that you should report to your doctor or health care professional as soon as possible: -blurred vision or other vision changes -chest pain -confusion -difficulty breathing -feelings of hostility or anger -muscle aches and pains -numbness or tingling in hands or feet -ringing in the ears -skin rash and itching -vomiting -weakness Side effects that usually do  not require medical attention (report to your doctor or health care professional if they continue or are bothersome): -disturbed dreams, nightmares -headache -nausea -restlessness or nervousness -sore throat and nasal  congestion -stomach upset This list may not describe all possible side effects. Call your doctor for medical advice about side effects. You may report side effects to FDA at 1-800-FDA-1088. Where should I keep my medicine? Keep out of the reach of children. Store at room temperature below 30 degrees C (86 degrees F). Protect from light. Keep container tightly closed. Throw away any unused medicine after the expiration date. NOTE: This sheet is a summary. It may not cover all possible information. If you have questions about this medicine, talk to your doctor, pharmacist, or health care provider.  2018 Elsevier/Gold Standard (2009-09-16 18:06:11)

## 2017-01-23 NOTE — Progress Notes (Signed)
Subjective:  Patient ID: Adrienne Robinson, female    DOB: 1955-03-30  Age: 61 y.o. MRN: [id number]  CC: Diagnoses of Anxiety associated with depression, Chronic pain syndrome, and Family history of colon cancer were pertinent to this visit.  HPI Adrienne Robinson presents for follow up on chronic pain with lumbar spinal stenosis   Treated for sinusitis in late October, followed by thrush  Treated with diflucan x 7 days   Has been having bilateral ear pain which appears to be referred from her cervical spine.   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 She has received a severance package and hs several interviews lined up but is very anxious due to the uncertainty.    Reviewed her recent colonoscopy and follow up plan.   Outpatient Medications Prior to Visit  Medication Sig Dispense Refill  . amLODipine (NORVASC) 10 MG tablet TAKE 1 TABLET (10 MG TOTAL) BY MOUTH DAILY. 90 tablet 3  . aspirin EC 81 MG tablet Take 81 mg by mouth daily.    Marland Kitchen buPROPion (WELLBUTRIN XL) 300 MG 24 hr tablet TAKE 1 TABLET (300 MG TOTAL) BY MOUTH DAILY. 90 tablet 1  . celecoxib (CELEBREX) 200 MG capsule Take 1 capsule (200 mg total) by mouth daily. 30 capsule 5  . cyanocobalamin (,VITAMIN B-12,) 1000 MCG/ML injection Inject 1 mL (1,000 mcg total) into the muscle once a week. 10 mL 5  . fluticasone (FLONASE) 50 MCG/ACT nasal spray USE 2 SPRAYS IN EACH NOSTRIL DAILY AS NEEDED 16 g 1  . LINZESS 290 MCG CAPS capsule TAKE 1 CAPSULE (290 MCG TOTAL) BY MOUTH DAILY BEFORE BREAKFAST. 90 capsule 0  . MULTIPLE VITAMIN PO Take 1 tablet by mouth daily.    . nebivolol (BYSTOLIC) 2.5 MG tablet Take 1 tablet (2.5 mg total) by mouth daily. 30 tablet 5  . pantoprazole (PROTONIX) 40 MG tablet Take 1 tablet (40 mg total) by mouth 2 (two) times daily. 180 tablet 3  . Probiotic Product (PROBIOTIC PO) Take 1  capsule by mouth daily.    . Syringe/Needle, Disp, (SYRINGE 3CC/25GX1") 25G X 1" 3 ML MISC Use for b12 injections 50 each 0  . Vitamin D, Ergocalciferol, (DRISDOL) 50000 units CAPS capsule TAKE ONE CAPSULE BY MOUTH EVERY 7 DAYS 12 capsule 0  . zolpidem (AMBIEN) 10 MG tablet Take 1 tablet (10 mg total) at bedtime as needed by mouth. for sleep 30 tablet 1  . HYDROcodone-acetaminophen (NORCO) 7.5-325 MG tablet Take 1 tablet by mouth every 4 (four) hours as needed for moderate pain. May refill on or after Jan 04 2017 180 tablet 0  . amoxicillin (AMOXIL) 500 MG capsule TAKE 4 CAPS 1 HR PRIOR TO DENTAL APPT THEN 2 CAPS ONCE DAILY AFTER PROCEDURE  1  . fluconazole (DIFLUCAN) 100 MG tablet Take 1 tablet (100 mg total) by mouth daily. (Patient not taking: Reported on 01/23/2017) 7 tablet 0  . fluticasone (FLONASE) 50 MCG/ACT nasal spray Place into the nose.    . fluticasone (FLONASE) 50 MCG/ACT nasal spray Place 2 sprays into both nostrils daily. (Patient not taking: Reported on 01/23/2017) 16 g 6  . HYDROcodone-homatropine (HYCODAN) 5-1.5 MG/5ML syrup Take 5 mLs by mouth every 6 (six) hours as needed for cough. (Patient not taking: Reported on 01/23/2017) 120 mL 0  . methocarbamol (ROBAXIN) 750 MG tablet Take by mouth.    Marland Kitchen  predniSONE (DELTASONE) 10 MG tablet 6 tablets on Day 1 , then reduce by 1 tablet daily until gone (Patient not taking: Reported on 01/23/2017) 21 tablet 0  . triamcinolone (KENALOG) 0.025 % ointment Apply 1 application topically 2 (two) times daily. For maximum 7 days. (Patient not taking: Reported on 01/23/2017) 30 g 0   Facility-Administered Medications Prior to Visit  Medication Dose Route Frequency Provider Last Rate Last Dose  . 0.9 %  sodium chloride infusion  500 mL Intravenous Continuous Pyrtle, Lajuan Lines, MD        Review of Systems;  Patient denies headache, fevers, malaise, unintentional weight loss, skin rash, eye pain, sinus congestion and sinus pain, sore throat, dysphagia,   hemoptysis , cough, dyspnea, wheezing, chest pain, palpitations, orthopnea, edema, abdominal pain, nausea, melena, diarrhea, constipation, flank pain, dysuria, hematuria, urinary  Frequency, nocturia, numbness, tingling, seizures,  Focal weakness, Loss of consciousness,  Tremor, insomnia, depression, anxiety, and suicidal ideation.      Objective:  BP 136/82 (BP Location: Left Arm, Patient Position: Sitting, Cuff Size: Normal)   Pulse 62   Temp 97.9 F (36.6 C) (Oral)   Resp 15   Ht 5\' 4"  (1.626 m)   Wt 123 lb 12.8 oz (56.2 kg)   SpO2 97%   BMI 21.25 kg/m   BP Readings from Last 3 Encounters:  01/23/17 136/82  12/18/16 138/82  12/08/16 112/68    Wt Readings from Last 3 Encounters:  01/23/17 123 lb 12.8 oz (56.2 kg)  12/18/16 121 lb 4 oz (55 kg)  12/08/16 122 lb 8 oz (55.6 kg)    General appearance: alert, cooperative and appears stated age Ears: normal TM's and external ear canals both ears Throat: lips, mucosa, and tongue normal; teeth and gums normal Neck: no adenopathy, no carotid bruit, supple, symmetrical, trachea midline and thyroid not enlarged, symmetric, no tenderness/mass/nodules Back: symmetric, no curvature. ROM normal. No CVA tenderness. Lungs: clear to auscultation bilaterally Heart: regular rate and rhythm, S1, S2 normal, no murmur, click, rub or gallop Abdomen: soft, non-tender; bowel sounds normal; no masses,  no organomegaly Pulses: 2+ and symmetric Skin: Skin color, texture, turgor normal. No rashes or lesions Lymph nodes: Cervical, supraclavicular, and axillary nodes normal.  Lab Results  Component Value Date   HGBA1C 5.7 08/04/2011    Lab Results  Component Value Date   CREATININE 1.17 10/16/2016   CREATININE 0.99 05/09/2016   CREATININE 1.31 (H) 04/21/2016    Lab Results  Component Value Date   WBC 4.8 03/04/2015   HGB 10.9 (L) 03/04/2015   HCT 32.1 (L) 03/04/2015   PLT 291 03/04/2015   GLUCOSE 109 (H) 10/16/2016   CHOL 245 (H)  10/16/2016   TRIG 104.0 10/16/2016   HDL 68.60 10/16/2016   LDLDIRECT 160.2 04/11/2013   LDLCALC 156 (H) 10/16/2016   ALT 23 10/16/2016   AST 21 10/16/2016   NA 137 10/16/2016   K 3.5 10/16/2016   CL 100 10/16/2016   CREATININE 1.17 10/16/2016   BUN 14 10/16/2016   CO2 31 10/16/2016   TSH 0.72 08/06/2014   INR 0.94 03/04/2015   HGBA1C 5.7 08/04/2011   MICROALBUR <0.7 05/09/2016    Mm Screening Breast Tomo Bilateral  Result Date: 07/06/2016 CLINICAL DATA:  Screening. EXAM: 2D DIGITAL SCREENING BILATERAL MAMMOGRAM WITH CAD AND ADJUNCT TOMO COMPARISON:  Previous exam(s). ACR Breast Density Category c: The breast tissue is heterogeneously dense, which may obscure small masses. FINDINGS: There are no findings suspicious for malignancy. Images  were processed with CAD. IMPRESSION: No mammographic evidence of malignancy. A result letter of this screening mammogram will be mailed directly to the patient. RECOMMENDATION: Screening mammogram in one year. (Code:SM-B-01Y) BI-RADS CATEGORY  1: Negative. Electronically Signed   By: Fidela Salisbury M.D.   On: 07/06/2016 16:56    Assessment & Plan:   Problem List Items Addressed This Visit    Anxiety associated with depression    Adding buspirone 5 mg bid/tid .  Benzodiazeipine use discouraged due to chronic use of hydrocodone       Relevant Medications   busPIRone (BUSPAR) 5 MG tablet   Chronic pain syndrome    She has chronic back pain Complicated by scoliosis. She is unable to sit for prolonged periods of time without increased pain.  She uses a standing desk at home. She continues to require 6 vicodin daily to manage her pain .  Refill history confirmed via [place] Controlled Substance database, accessed by me today..REfills for 3 months given .  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          I have discontinued Toluwanimi D. Asmar's HYDROcodone-acetaminophen, amoxicillin, methocarbamol, HYDROcodone-homatropine, predniSONE, fluconazole, triamcinolone, and HYDROcodone-acetaminophen. I have also changed her HYDROcodone-acetaminophen. Additionally, I am having her start on predniSONE and busPIRone. Lastly, I am having her maintain her aspirin EC, cyanocobalamin, SYRINGE 3CC/25GX1", amLODipine, MULTIPLE VITAMIN PO, Probiotic Product (PROBIOTIC PO), celecoxib, buPROPion, pantoprazole, Vitamin D (Ergocalciferol), nebivolol, fluticasone, zolpidem, and LINZESS. We will continue to administer sodium chloride.  Meds ordered this encounter  Medications  . predniSONE (DELTASONE) 10 MG tablet    Sig: 6 tablets daily for 3 days   then reduce by 1 tablet daily until gone    Dispense:  33 tablet    Refill:  0  . busPIRone (BUSPAR) 5 MG tablet    Sig: Take 1 tablet (5 mg total) by mouth 3 (three) times daily.    Dispense:  90 tablet    Refill:  1  . DISCONTD: HYDROcodone-acetaminophen (NORCO) 7.5-325 MG tablet    Sig: Take 1 tablet by mouth every 4 (four) hours as needed for moderate pain. May refill on or after Feb 03 2017    Dispense:  186 tablet    Refill:  0  . DISCONTD: HYDROcodone-acetaminophen (NORCO) 7.5-325 MG tablet    Sig: Take 1 tablet by mouth every 4 (four) hours as needed for moderate pain. May refill on or after March 06 2017    Dispense:  186 tablet    Refill:  0  . HYDROcodone-acetaminophen (NORCO) 7.5-325 MG tablet    Sig: Take 1 tablet by mouth every 4 (four) hours as needed for moderate pain. May refill on or after  April 06 2017    Dispense:  180 tablet    Refill:  0    Medications Discontinued During This Encounter  Medication Reason  . amoxicillin (AMOXIL) 500 MG capsule Completed Course  . fluconazole (DIFLUCAN) 100 MG tablet Completed Course  . fluticasone (FLONASE) 50 MCG/ACT nasal  spray Duplicate  . fluticasone (FLONASE) 50 MCG/ACT nasal spray Duplicate  . HYDROcodone-homatropine (HYCODAN) 5-1.5 MG/5ML syrup Patient has not taken in last 30 days  . methocarbamol (ROBAXIN) 750 MG tablet Patient  has not taken in last 30 days  . predniSONE (DELTASONE) 10 MG tablet Completed Course  . triamcinolone (KENALOG) 0.025 % ointment Completed Course  . HYDROcodone-acetaminophen (NORCO) 7.5-325 MG tablet Reorder  . HYDROcodone-acetaminophen (NORCO) 7.5-325 MG tablet Reorder  . HYDROcodone-acetaminophen (NORCO) 7.5-325 MG tablet Reorder    Follow-up: No Follow-up on file.   [name], MD

## 2017-01-24 DIAGNOSIS — F419 Anxiety disorder, unspecified: Secondary | ICD-10-CM | POA: Insufficient documentation

## 2017-01-24 DIAGNOSIS — F418 Other specified anxiety disorders: Secondary | ICD-10-CM | POA: Insufficient documentation

## 2017-01-24 NOTE — Assessment & Plan Note (Signed)
Adding buspirone 5 mg bid/tid .  Benzodiazeipine use discouraged due to chronic use of hydrocodone

## 2017-01-24 NOTE — Assessment & Plan Note (Signed)
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

## 2017-01-24 NOTE — Assessment & Plan Note (Signed)
She has chronic back pain Complicated by scoliosis. She is unable to sit for prolonged periods of time without increased pain.  She uses a standing desk at home. She continues to require 6 vicodin daily to manage her pain .  Refill history confirmed via [place] Controlled Substance database, accessed by me today..REfills for 3 months given .  Adding tizanidine for prn evning use given presence of muscle spasm involving the entire right paraspinus muscle region

## 2017-01-26 ENCOUNTER — Encounter: Payer: Self-pay | Admitting: Internal Medicine

## 2017-02-01 ENCOUNTER — Encounter: Payer: Self-pay | Admitting: Internal Medicine

## 2017-02-02 ENCOUNTER — Telehealth: Payer: Self-pay

## 2017-02-02 ENCOUNTER — Telehealth: Payer: Self-pay | Admitting: Internal Medicine

## 2017-02-02 MED ORDER — HYDROCODONE-ACETAMINOPHEN 7.5-325 MG PO TABS: 1.0000 | ORAL_TABLET | ORAL | 0 refills | Status: DC | PRN

## 2017-02-02 NOTE — Telephone Encounter (Signed)
Pt has picked up corrected rxs.

## 2017-02-02 NOTE — Telephone Encounter (Signed)
Reprinted rxs per Dr. Derrel Nip because the pharmacy is stating that the rxs pt brought to them are phot copied because of the streaks on the paper. Explained to the lady at the pharmacy that our printer has been doing that, that the rxs are not photocopied. The pharmacy tech stated that Dr. Derrel Nip would need to sign the rxs in a different color ink and have the pt bring them back in.

## 2017-02-02 NOTE — Telephone Encounter (Signed)
Copied from [place]. Topic: Quick Communication - See Telephone Encounter >> Feb 02, 2017 12:12 PM Corie Chiquito, [place] wrote: CRM for notification. See Telephone encounter for: Patient called to check on the status of a hard copy for her Hydrocodone-Acetaminophen that she needs to pick up. Reach out to the office and spoke with [place] and she stated that she would call pt when the script is ready for pick up. Patient would like that call to be to her cell phone.  02/02/17.

## 2017-02-02 NOTE — Telephone Encounter (Signed)
Reprinted with correct date.

## 2017-02-02 NOTE — Telephone Encounter (Signed)
We took care of this earlier

## 2017-02-02 NOTE — Telephone Encounter (Signed)
Copied from [place]. Topic: Quick Communication - See Telephone Encounter >> Feb 02, 2017  8:16 AM Aurelio Brash B wrote: CRM for notification. See Telephone encounter for:  Pt picked up script for hydrocodone- acetaminophen  but it was the copy, the pharmacy needs the original,  pt wants to pick up the original rx today.  02/02/17.

## 2017-02-05 NOTE — Telephone Encounter (Signed)
Rxs were reprinted, and signed by Dr. Derrel Nip on Friday. The pt was given the rxs on Friday as well.

## 2017-02-14 ENCOUNTER — Encounter: Payer: Self-pay | Admitting: Internal Medicine

## 2017-02-14 MED ORDER — HYDROXYZINE HCL 25 MG PO TABS: 25.0000 mg | ORAL_TABLET | Freq: Three times a day (TID) | ORAL | 0 refills | Status: DC | PRN

## 2017-02-21 ENCOUNTER — Ambulatory Visit (INDEPENDENT_AMBULATORY_CARE_PROVIDER_SITE_OTHER): Payer: 59 | Admitting: Family Medicine

## 2017-02-21 ENCOUNTER — Encounter: Payer: Self-pay | Admitting: Family Medicine

## 2017-02-21 ENCOUNTER — Ambulatory Visit: Payer: Self-pay

## 2017-02-21 VITALS — BP 130/72 | HR 80 | Ht 66.0 in | Wt 124.0 lb

## 2017-02-21 DIAGNOSIS — M999 Biomechanical lesion, unspecified: Secondary | ICD-10-CM

## 2017-02-21 DIAGNOSIS — M48062 Spinal stenosis, lumbar region with neurogenic claudication: Secondary | ICD-10-CM | POA: Diagnosis not present

## 2017-02-21 DIAGNOSIS — M1812 Unilateral primary osteoarthritis of first carpometacarpal joint, left hand: Secondary | ICD-10-CM

## 2017-02-21 DIAGNOSIS — M180 Bilateral primary osteoarthritis of first carpometacarpal joints: Secondary | ICD-10-CM | POA: Insufficient documentation

## 2017-02-21 DIAGNOSIS — M79645 Pain in left finger(s): Secondary | ICD-10-CM | POA: Diagnosis not present

## 2017-02-21 MED ORDER — PREDNISONE 50 MG PO TABS: 50.0000 mg | ORAL_TABLET | Freq: Every day | ORAL | 0 refills | Status: DC

## 2017-02-21 NOTE — Assessment & Plan Note (Signed)
Decision today to treat with OMT was based on Physical Exam  After verbal consent patient was treated with HVLA, ME, FPR techniques in  thoracic, lumbar and sacral areas  Patient tolerated the procedure well with improvement in symptoms  Patient given exercises, stretches and lifestyle modifications  See medications in patient instructions if given  Patient will follow up in 4 weeks 

## 2017-02-21 NOTE — Assessment & Plan Note (Signed)
Patient given an injection and tolerated the procedure well.  We discussed bracing at night and topical anti-inflammatories.  Follow-up again in 4 weeks

## 2017-02-21 NOTE — Assessment & Plan Note (Signed)
Known spinal stenosis.  Responded fairly well to osteopathic manipulation.  We discussed and encouraged patient to take the medications on a regular basis.  Patient has icing regimen.  Follow-up with me again in 4-8 weeks.

## 2017-02-21 NOTE — Progress Notes (Signed)
[name] [address], [place] [address] Phone: [phone] Subjective:    I'm seeing this patient by the request  of:    CC: Left thumb pain, back pain  EPP:[id number]  Adrienne Robinson is a 62 y.o. female coming in with complaint of left thumb pain.  Found to have [place] arthritis.  Given injection greater than a year ago.  Started having worsening symptoms again.  Affecting daily activities.  Given waking her up at night.  Patient also is having back pain.  Has been seen multiple times.  Has responded fairly well to this as well.      Past Medical History:  Diagnosis Date  . Allergy   . Arthritis   . 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 B12 deficiency 01/2016  . Candida esophagitis ([place])   . Degenerative joint disease involving multiple joints   . Depression   . Dizziness   . Hypertension   . Pneumonia    approx 6 years ago  . Scoliosis   . Spinal stenosis of lumbar region   . Tubular adenoma of colon    Past Surgical History:  Procedure Laterality Date  . ABDOMINAL HYSTERECTOMY    . APPENDECTOMY    . CARDIAC CATHETERIZATION     normal coronaries, no wall motion abnormalities 11/02/09 [facility])  . NASAL SINUS SURGERY    . OOPHORECTOMY    . RADIOLOGY WITH ANESTHESIA  02/26/2012   Procedure: RADIOLOGY WITH ANESTHESIA;  Surgeon: [name], MD;  Location: [facility];  Service: Radiology;  Laterality: N/A;  . REVERSE SHOULDER ARTHROPLASTY Right 06/27/2013   Procedure: REVERSE RIGHT TOTAL SHOULDER ARTHROPLASTY;  Surgeon: [name], MD;  Location: [facility];  Service: Orthopedics;  Laterality: Right;  . SHOULDER ARTHROSCOPY WITH ROTATOR CUFF REPAIR AND SUBACROMIAL DECOMPRESSION  2007   left  . TONSILLECTOMY  adnoids   Social History   Socioeconomic History  . Marital status: Married    Spouse name: None  . Number of children: 0  . Years of education: 19  . Highest  education level: None  Social Needs  . Financial resource strain: None  . Food insecurity - worry: None  . Food insecurity - inability: None  . Transportation needs - medical: None  . Transportation needs - non-medical: None  Occupational History  . Occupation: Nurse executive   Tobacco Use  . Smoking status: Never Smoker  . Smokeless tobacco: Never Used  Substance and Sexual Activity  . Alcohol use: Yes    Alcohol/week: 1.2 oz    Types: 2 Glasses of wine per week    Comment: 2-3 week with dinner  . Drug use: No  . Sexual activity: None  Other Topics Concern  . None  Social History Narrative   Lives at home with her husband.   Right-handed.   2-3 diet cokes per day.   Allergies  Allergen Reactions  . Morphine And Related Rash  . Adhesive [Tape] Rash and Other (See Comments)    Redness, blisters, skin peeling off.   Family History  Problem Relation Age of Onset  . Colon cancer Father 83  . Diabetes Father   . Heart disease Father   . Rectal cancer Father   . Thyroid disease Mother   . Colon cancer Paternal Grandfather   . Colon cancer Other        pggm  . Breast cancer  Paternal Grandmother   . Stomach cancer Neg Hx      Past medical history, social, surgical and family history all reviewed in electronic medical record.  No pertanent information unless stated regarding to the chief complaint.   Review of Systems:Review of systems updated and as accurate as of 02/21/17  No headache, visual changes, nausea, vomiting, diarrhea, constipation, dizziness, abdominal pain, skin rash, fevers, chills, night sweats, weight loss, swollen lymph nodes, body aches, joint swelling, , chest pain, shortness of breath, mood changes.  Positive muscle aches  Objective  Blood pressure 130/72, pulse 80, height 5\' 6"  (1.676 m), weight 124 lb (56.2 kg), SpO2 98 %. Systems examined below as of 02/21/17   General: No apparent distress alert and oriented x3 mood and affect normal, dressed  appropriately.  HEENT: Pupils equal, extraocular movements intact  Respiratory: Patient's speak in full sentences and does not appear short of breath  Cardiovascular: No lower extremity edema, non tender, no erythema  Skin: Warm dry intact with no signs of infection or rash on extremities or on axial skeleton.  Abdomen: Soft nontender  Neuro: Cranial nerves II through XII are intact, neurovascularly intact in all extremities with 2+ DTRs and 2+ pulses.  Lymph: No lymphadenopathy of posterior or anterior cervical chain or axillae bilaterally.  Gait normal with good balance and coordination.  MSK:  Non tender with full range of motion and good stability and symmetric strength and tone of shoulders, elbows, hip, knee and ankles bilaterally.   + CMC grind on lefet with OMT  Back Exam:  Inspection: Mild degenerative scoliosis noted Motion: Flexion 45 deg, Extension 25 deg, Side Bending to 35 deg bilaterally,  Rotation to 30 deg bilaterally  SLR laying: Negative  XSLR laying: Negative  Palpable tenderness: To palpation in the paraspinal musculature lumbar spine.Marland Kitchen FABER: Mild tightness bilaterally. Sensory change: Gross sensation intact to all lumbar and sacral dermatomes.  Reflexes: 2+ at both patellar tendons, 2+ at achilles tendons, Babinski's downgoing.  Strength at foot  Plantar-flexion: 5/5 Dorsi-flexion: 5/5 Eversion: 5/5 Inversion: 5/5  Leg strength  Quad: 5/5 Hamstring: 5/5 Hip flexor: 5/5 Hip abductors: 5/5  Gait unremarkable.  Osteopathic findings  T3 extended rotated and side bent right inhaled third rib T7 extended rotated and side bent left L3 flexed rotated and side bent right Sacrum right on right  Procedure: Real-time Ultrasound Guided Injection of left CMC joint Device: GE Logiq Q7 Ultrasound guided injection is preferred based studies that show increased duration, increased effect, greater accuracy, decreased procedural pain, increased response rate, and decreased  cost with ultrasound guided versus blind injection.  Verbal informed consent obtained.  Time-out conducted.  Noted no overlying erythema, induration, or other signs of local infection.  Skin prepped in a sterile fashion.  Local anesthesia: Topical Ethyl chloride.  With sterile technique and under real time ultrasound guidance: With a 25-gauge half inch needle patient was injected with a total of 0.5 cc of 0.5% Marcaine and 0.5 cc of Kenalog 40 mg/dL. Completed without difficulty  Pain immediately resolved suggesting accurate placement of the medication.  Advised to call if fevers/chills, erythema, induration, drainage, or persistent bleeding.  Images permanently stored and available for review in the ultrasound unit.  Impression: Technically successful ultrasound guided injection.     Impression and Recommendations:     This case required medical decision making of moderate complexity.      Note: This dictation was prepared with Dragon dictation along with smaller phrase technology. Any transcriptional errors  that result from this process are unintentional.

## 2017-02-21 NOTE — Patient Instructions (Signed)
Good to see you  Good luck with the hubby  See me again in 3 weeks

## 2017-02-26 ENCOUNTER — Other Ambulatory Visit: Payer: Self-pay | Admitting: Internal Medicine

## 2017-02-27 NOTE — Telephone Encounter (Signed)
Last OV 01/23/2017 Next OV 04/25/2017 Last refill 12/29/2016

## 2017-02-28 ENCOUNTER — Encounter: Payer: Self-pay | Admitting: Family Medicine

## 2017-02-28 MED ORDER — PREDNISONE 50 MG PO TABS: 50.0000 mg | ORAL_TABLET | Freq: Every day | ORAL | 0 refills | Status: DC

## 2017-02-28 NOTE — Telephone Encounter (Signed)
Printed, signed and faxed.  

## 2017-03-09 ENCOUNTER — Telehealth: Payer: Self-pay | Admitting: Internal Medicine

## 2017-03-09 NOTE — Telephone Encounter (Signed)
Pt called in because she received a call from the neurosurgeons office. Pt is requesting a call back from providers nurse if possible.    CB:  : [phone]

## 2017-03-09 NOTE — Telephone Encounter (Signed)
Message was supposed to be under husbands chart.  Information was incorrect.

## 2017-03-14 ENCOUNTER — Ambulatory Visit: Payer: 59 | Admitting: Family Medicine

## 2017-03-14 ENCOUNTER — Other Ambulatory Visit: Payer: Self-pay | Admitting: Internal Medicine

## 2017-03-14 NOTE — Progress Notes (Deleted)
[name] [address] [place], [place] [address] Phone: [phone] Subjective:    I'm seeing this patient by the request  of:    CC: Back pain follow-up, thumb pain follow-up  [id number]  Adrienne Robinson is a 62 y.o. female coming in with complaint of low back pain.  Has been seen previously.  X-rays 1 year ago did show lumbar scoliosis with mild osteoarthritic changes.  This was independently visualized by me.  Patient has responded fairly well to osteopathic manipulation and icing regimen.  Patient states  Patient also was found to have [place] arthritis.  Given an injection 3 weeks ago.  Left side. patient states    Past Medical History:  Diagnosis Date  . Allergy   . Arthritis   . 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 B12 deficiency 01/2016  . Candida esophagitis ([place])   . Degenerative joint disease involving multiple joints   . Depression   . Dizziness   . Hypertension   . Pneumonia    approx 6 years ago  . Scoliosis   . Spinal stenosis of lumbar region   . Tubular adenoma of colon    Past Surgical History:  Procedure Laterality Date  . ABDOMINAL HYSTERECTOMY    . APPENDECTOMY    . CARDIAC CATHETERIZATION     normal coronaries, no wall motion abnormalities 11/02/09 [facility])  . NASAL SINUS SURGERY    . OOPHORECTOMY    . RADIOLOGY WITH ANESTHESIA  02/26/2012   Procedure: RADIOLOGY WITH ANESTHESIA;  Surgeon: [name], MD;  Location: [place];  Service: Radiology;  Laterality: N/A;  . REVERSE SHOULDER ARTHROPLASTY Right 06/27/2013   Procedure: REVERSE RIGHT TOTAL SHOULDER ARTHROPLASTY;  Surgeon: [name], MD;  Location: [facility];  Service: Orthopedics;  Laterality: Right;  . SHOULDER ARTHROSCOPY WITH ROTATOR CUFF REPAIR AND SUBACROMIAL DECOMPRESSION  2007   left  . TONSILLECTOMY  adnoids   Social History   Socioeconomic History  . Marital status: Married    Spouse  name: Not on file  . Number of children: 0  . Years of education: 66  . Highest education level: Not on file  Social Needs  . Financial resource strain: Not on file  . Food insecurity - worry: Not on file  . Food insecurity - inability: Not on file  . Transportation needs - medical: Not on file  . Transportation needs - non-medical: Not on file  Occupational History  . Occupation: Nurse executive   Tobacco Use  . Smoking status: Never Smoker  . Smokeless tobacco: Never Used  Substance and Sexual Activity  . Alcohol use: Yes    Alcohol/week: 1.2 oz    Types: 2 Glasses of wine per week    Comment: 2-3 week with dinner  . Drug use: No  . Sexual activity: Not on file  Other Topics Concern  . Not on file  Social History Narrative   Lives at home with her husband.   Right-handed.   2-3 diet cokes per day.   Allergies  Allergen Reactions  . Morphine And Related Rash  . Adhesive [Tape] Rash and Other (See Comments)    Redness, blisters, skin peeling off.   Family History  Problem Relation Age of Onset  . Colon cancer Father 34  . Diabetes Father   . Heart disease Father   . Rectal cancer Father   . Thyroid disease Mother   .  Colon cancer Paternal Grandfather   . Colon cancer Other        pggm  . Breast cancer Paternal Grandmother   . Stomach cancer Neg Hx      Past medical history, social, surgical and family history all reviewed in electronic medical record.  No pertanent information unless stated regarding to the chief complaint.   Review of Systems:Review of systems updated and as accurate as of 03/14/17  No headache, visual changes, nausea, vomiting, diarrhea, constipation, dizziness, abdominal pain, skin rash, fevers, chills, night sweats, weight loss, swollen lymph nodes, body aches, joint swelling, muscle aches, chest pain, shortness of breath, mood changes.   Objective  There were no vitals taken for this visit. Systems examined below as of 03/14/17     General: No apparent distress alert and oriented x3 mood and affect normal, dressed appropriately.  HEENT: Pupils equal, extraocular movements intact  Respiratory: Patient's speak in full sentences and does not appear short of breath  Cardiovascular: No lower extremity edema, non tender, no erythema  Skin: Warm dry intact with no signs of infection or rash on extremities or on axial skeleton.  Abdomen: Soft nontender  Neuro: Cranial nerves II through XII are intact, neurovascularly intact in all extremities with 2+ DTRs and 2+ pulses.  Lymph: No lymphadenopathy of posterior or anterior cervical chain or axillae bilaterally.  Gait normal with good balance and coordination.  MSK:  Non tender with full range of motion and good stability and symmetric strength and tone of shoulders, elbows, wrist, hip, knee and ankles bilaterally.     Impression and Recommendations:     This case required medical decision making of moderate complexity.      Note: This dictation was prepared with Dragon dictation along with smaller phrase technology. Any transcriptional errors that result from this process are unintentional.

## 2017-03-15 ENCOUNTER — Other Ambulatory Visit: Payer: Self-pay | Admitting: Internal Medicine

## 2017-03-16 ENCOUNTER — Telehealth: Payer: Self-pay | Admitting: Internal Medicine

## 2017-03-16 ENCOUNTER — Ambulatory Visit: Payer: 59 | Admitting: Internal Medicine

## 2017-03-16 NOTE — Telephone Encounter (Signed)
Noted  

## 2017-03-26 NOTE — Progress Notes (Deleted)
[facility] [address] [place], [place] [address] Phone: [phone] Subjective:    I'm seeing this patient by the request  of:    CC:   [id number]  Adrienne Robinson is a 62 y.o. female coming in with complaint of ***  Onset-  Location Duration-  Character- Aggravating factors- Reliving factors-  Therapies tried-  Severity-     Past Medical History:  Diagnosis Date  . Allergy   . Arthritis   . 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 B12 deficiency 01/2016  . Candida esophagitis ([place])   . Degenerative joint disease involving multiple joints   . Depression   . Dizziness   . Hypertension   . Pneumonia    approx 6 years ago  . Scoliosis   . Spinal stenosis of lumbar region   . Tubular adenoma of colon    Past Surgical History:  Procedure Laterality Date  . ABDOMINAL HYSTERECTOMY    . APPENDECTOMY    . CARDIAC CATHETERIZATION     normal coronaries, no wall motion abnormalities 11/02/09 [facility])  . NASAL SINUS SURGERY    . OOPHORECTOMY    . RADIOLOGY WITH ANESTHESIA  02/26/2012   Procedure: RADIOLOGY WITH ANESTHESIA;  Surgeon: [name], MD;  Location: [facility];  Service: Radiology;  Laterality: N/A;  . REVERSE SHOULDER ARTHROPLASTY Right 06/27/2013   Procedure: REVERSE RIGHT TOTAL SHOULDER ARTHROPLASTY;  Surgeon: [name], MD;  Location: [place] [facility];  Service: Orthopedics;  Laterality: Right;  . SHOULDER ARTHROSCOPY WITH ROTATOR CUFF REPAIR AND SUBACROMIAL DECOMPRESSION  2007   left  . TONSILLECTOMY  adnoids   Social History   Socioeconomic History  . Marital status: Married    Spouse name: Not on file  . Number of children: 0  . Years of education: 32  . Highest education level: Not on file  Social Needs  . Financial resource strain: Not on file  . Food insecurity - worry: Not on file  . Food insecurity - inability: Not on file  . Transportation needs - medical:  Not on file  . Transportation needs - non-medical: Not on file  Occupational History  . Occupation: Nurse executive   Tobacco Use  . Smoking status: Never Smoker  . Smokeless tobacco: Never Used  Substance and Sexual Activity  . Alcohol use: Yes    Alcohol/week: 1.2 oz    Types: 2 Glasses of wine per week    Comment: 2-3 week with dinner  . Drug use: No  . Sexual activity: Not on file  Other Topics Concern  . Not on file  Social History Narrative   Lives at home with her husband.   Right-handed.   2-3 diet cokes per day.   Allergies  Allergen Reactions  . Morphine And Related Rash  . Adhesive [Tape] Rash and Other (See Comments)    Redness, blisters, skin peeling off.   Family History  Problem Relation Age of Onset  . Colon cancer Father 46  . Diabetes Father   . Heart disease Father   . Rectal cancer Father   . Thyroid disease Mother   . Colon cancer Paternal Grandfather   . Colon cancer Other        pggm  . Breast cancer Paternal Grandmother   . Stomach cancer Neg Hx      Past medical history, social, surgical and family history all reviewed in electronic  medical record.  No pertanent information unless stated regarding to the chief complaint.   Review of Systems:Review of systems updated and as accurate as of 03/26/17  No headache, visual changes, nausea, vomiting, diarrhea, constipation, dizziness, abdominal pain, skin rash, fevers, chills, night sweats, weight loss, swollen lymph nodes, body aches, joint swelling, muscle aches, chest pain, shortness of breath, mood changes.   Objective  There were no vitals taken for this visit. Systems examined below as of 03/26/17   General: No apparent distress alert and oriented x3 mood and affect normal, dressed appropriately.  HEENT: Pupils equal, extraocular movements intact  Respiratory: Patient's speak in full sentences and does not appear short of breath  Cardiovascular: No lower extremity edema, non tender, no  erythema  Skin: Warm dry intact with no signs of infection or rash on extremities or on axial skeleton.  Abdomen: Soft nontender  Neuro: Cranial nerves II through XII are intact, neurovascularly intact in all extremities with 2+ DTRs and 2+ pulses.  Lymph: No lymphadenopathy of posterior or anterior cervical chain or axillae bilaterally.  Gait normal with good balance and coordination.  MSK:  Non tender with full range of motion and good stability and symmetric strength and tone of shoulders, elbows, wrist, hip, knee and ankles bilaterally.     Impression and Recommendations:     This case required medical decision making of moderate complexity.      Note: This dictation was prepared with Dragon dictation along with smaller phrase technology. Any transcriptional errors that result from this process are unintentional.

## 2017-03-27 ENCOUNTER — Ambulatory Visit: Payer: 59 | Admitting: Family Medicine

## 2017-04-02 ENCOUNTER — Other Ambulatory Visit: Payer: Self-pay | Admitting: Internal Medicine

## 2017-04-09 ENCOUNTER — Encounter: Payer: Self-pay | Admitting: Internal Medicine

## 2017-04-09 ENCOUNTER — Ambulatory Visit (INDEPENDENT_AMBULATORY_CARE_PROVIDER_SITE_OTHER): Payer: 59 | Admitting: Internal Medicine

## 2017-04-09 VITALS — BP 138/84 | HR 68 | Temp 97.9°F | Resp 15 | Ht 66.0 in | Wt 123.2 lb

## 2017-04-09 DIAGNOSIS — Z23 Encounter for immunization: Secondary | ICD-10-CM | POA: Diagnosis not present

## 2017-04-09 DIAGNOSIS — I10 Essential (primary) hypertension: Secondary | ICD-10-CM

## 2017-04-09 DIAGNOSIS — M159 Polyosteoarthritis, unspecified: Secondary | ICD-10-CM

## 2017-04-09 DIAGNOSIS — F321 Major depressive disorder, single episode, moderate: Secondary | ICD-10-CM

## 2017-04-09 MED ORDER — ALPRAZOLAM 0.25 MG PO TABS: 0.2500 mg | ORAL_TABLET | Freq: Every day | ORAL | 2 refills | Status: DC | PRN

## 2017-04-09 MED ORDER — HYDROCODONE-ACETAMINOPHEN 7.5-325 MG PO TABS: 1.0000 | ORAL_TABLET | ORAL | 0 refills | Status: DC | PRN

## 2017-04-09 MED ORDER — ZOLPIDEM TARTRATE 10 MG PO TABS: 10.0000 mg | ORAL_TABLET | Freq: Every evening | ORAL | 5 refills | Status: DC | PRN

## 2017-04-09 NOTE — Progress Notes (Signed)
Subjective:  Patient ID: Adrienne Robinson, female    DOB: January 31, 1956  Age: 62 y.o. MRN: [id number]  CC: The primary encounter diagnosis was Essential hypertension. Diagnoses of Need for shingles vaccine, Osteoarthritis of multiple joints, unspecified osteoarthritis type, and Major depressive disorder, single episode, moderate ([place]) were also pertinent to this visit.  HPI Adrienne Robinson presents for follow up on chronic pain secondary to DJD  And spinal stenosis involving t . Both lumbar and cervical , managed with hydrocodone and celebrex'   Using ambien for insomnia  Daytime axiety: a  trial of atarax 25 mg as not tolerated due to excessive sedation.   Still out of work,  Several family stressors.  Discussed trial of alprazolam 0.25 mg once daily   Gi SYMPTOMS UNDER CONTRO  WITH ppi BID   B12 INJECTIONS AND MEGA DSE OF D WEEKLY   LEFT MCP INJECTED 2 MONTHWSAGO, GOOD TRANSIENT RELEIF    Outpatient Medications Prior to Visit  Medication Sig Dispense Refill  . amLODipine (NORVASC) 10 MG tablet TAKE 1 TABLET (10 MG TOTAL) BY MOUTH DAILY. 90 tablet 3  . aspirin EC 81 MG tablet Take 81 mg by mouth daily.    Marland Kitchen buPROPion (WELLBUTRIN XL) 300 MG 24 hr tablet TAKE 1 TABLET (300 MG TOTAL) BY MOUTH DAILY. 90 tablet 1  . celecoxib (CELEBREX) 200 MG capsule TAKE 1 CAPSULE BY MOUTH EVERY DAY 30 capsule 5  . cyanocobalamin (,VITAMIN B-12,) 1000 MCG/ML injection INJECT 1 ML (1,000 MCG TOTAL) INTO THE MUSCLE ONCE A WEEK. 10 mL 0  . fluticasone (FLONASE) 50 MCG/ACT nasal spray USE 2 SPRAYS IN EACH NOSTRIL DAILY AS NEEDED 16 g 1  . hydrOXYzine (ATARAX/VISTARIL) 25 MG tablet TAKE 1 TABLET (25 MG TOTAL) BY MOUTH EVERY 8 (EIGHT) HOURS AS NEEDED. FOR ANXIETY 90 tablet 1  . LINZESS 290 MCG CAPS capsule TAKE 1 CAPSULE (290 MCG TOTAL) BY MOUTH DAILY BEFORE BREAKFAST. 90 capsule 0  . MULTIPLE VITAMIN PO Take 1 tablet by mouth daily.    . nebivolol (BYSTOLIC) 2.5 MG tablet Take 1 tablet (2.5 mg total) by mouth  daily. 30 tablet 5  . pantoprazole (PROTONIX) 40 MG tablet Take 1 tablet (40 mg total) by mouth 2 (two) times daily. 180 tablet 3  . Probiotic Product (PROBIOTIC PO) Take 1 capsule by mouth daily.    . Syringe/Needle, Disp, (SYRINGE 3CC/25GX1") 25G X 1" 3 ML MISC Use for b12 injections 50 each 0  . Vitamin D, Ergocalciferol, (DRISDOL) 50000 units CAPS capsule TAKE ONE CAPSULE BY MOUTH EVERY 7 DAYS 12 capsule 0  . HYDROcodone-acetaminophen (NORCO) 7.5-325 MG tablet Take 1 tablet by mouth every 4 (four) hours as needed for moderate pain. May refill on or after February 03 2017 180 tablet 0  . zolpidem (AMBIEN) 10 MG tablet TAKE 1 TABLET BY MOUTH AT BEDTIME AS NEEDED FOR SLEEP 30 tablet 1  . busPIRone (BUSPAR) 5 MG tablet Take 1 tablet (5 mg total) by mouth 3 (three) times daily. (Patient not taking: Reported on 04/09/2017) 90 tablet 1  . predniSONE (DELTASONE) 10 MG tablet 6 tablets daily for 3 days   then reduce by 1 tablet daily until gone (Patient not taking: Reported on 02/21/2017) 33 tablet 0  . predniSONE (DELTASONE) 50 MG tablet Take 1 tablet (50 mg total) by mouth daily. (Patient not taking: Reported on 04/09/2017) 5 tablet 0  . predniSONE (DELTASONE) 50 MG tablet Take 1 tablet (50 mg total) by mouth daily. (Patient  not taking: Reported on 04/09/2017) 5 tablet 0   Facility-Administered Medications Prior to Visit  Medication Dose Route Frequency Provider Last Rate Last Dose  . 0.9 %  sodium chloride infusion  500 mL Intravenous Continuous Pyrtle, Lajuan Lines, MD        Review of Systems;  Patient denies headache, fevers, malaise, unintentional weight loss, skin rash, eye pain, sinus congestion and sinus pain, sore throat, dysphagia,  hemoptysis , cough, dyspnea, wheezing, chest pain, palpitations, orthopnea, edema, abdominal pain, nausea, melena, diarrhea, constipation, flank pain, dysuria, hematuria, urinary  Frequency, nocturia, numbness, tingling, seizures,  Focal weakness, Loss of consciousness,   Tremor, insomnia, depression, anxiety, and suicidal ideation.      Objective:  BP 138/84 (BP Location: Left Arm, Patient Position: Sitting, Cuff Size: Normal)   Pulse 68   Temp 97.9 F (36.6 C) (Oral)   Resp 15   Ht 5\' 6"  (1.676 m)   Wt 123 lb 3.2 oz (55.9 kg)   SpO2 97%   BMI 19.89 kg/m   BP Readings from Last 3 Encounters:  04/09/17 138/84  02/21/17 130/72  01/23/17 136/82    Wt Readings from Last 3 Encounters:  04/09/17 123 lb 3.2 oz (55.9 kg)  02/21/17 124 lb (56.2 kg)  01/23/17 123 lb 12.8 oz (56.2 kg)    General appearance: alert, cooperative and appears stated age Ears: normal TM's and external ear canals both ears Throat: lips, mucosa, and tongue normal; teeth and gums normal Neck: no adenopathy, no carotid bruit, supple, symmetrical, trachea midline and thyroid not enlarged, symmetric, no tenderness/mass/nodules Back: symmetric, no curvature. ROM normal. No CVA tenderness. Lungs: clear to auscultation bilaterally Heart: regular rate and rhythm, S1, S2 normal, no murmur, click, rub or gallop Abdomen: soft, non-tender; bowel sounds normal; no masses,  no organomegaly Pulses: 2+ and symmetric Skin: Skin color, texture, turgor normal. No rashes or lesions Lymph nodes: Cervical, supraclavicular, and axillary nodes normal.  Lab Results  Component Value Date   HGBA1C 5.7 08/04/2011    Lab Results  Component Value Date   CREATININE 1.21 (H) 04/09/2017   CREATININE 1.17 10/16/2016   CREATININE 0.99 05/09/2016    Lab Results  Component Value Date   WBC 4.8 03/04/2015   HGB 10.9 (L) 03/04/2015   HCT 32.1 (L) 03/04/2015   PLT 291 03/04/2015   GLUCOSE 90 04/09/2017   CHOL 245 (H) 10/16/2016   TRIG 104.0 10/16/2016   HDL 68.60 10/16/2016   LDLDIRECT 160.2 04/11/2013   LDLCALC 156 (H) 10/16/2016   ALT 17 04/09/2017   AST 18 04/09/2017   NA 136 04/09/2017   K 4.3 04/09/2017   CL 101 04/09/2017   CREATININE 1.21 (H) 04/09/2017   BUN 20 04/09/2017   CO2  28 04/09/2017   TSH 0.72 08/06/2014   INR 0.94 03/04/2015   HGBA1C 5.7 08/04/2011   MICROALBUR <0.7 05/09/2016    Mm Screening Breast Tomo Bilateral  Result Date: 07/06/2016 CLINICAL DATA:  Screening. EXAM: 2D DIGITAL SCREENING BILATERAL MAMMOGRAM WITH CAD AND ADJUNCT TOMO COMPARISON:  Previous exam(s). ACR Breast Density Category c: The breast tissue is heterogeneously dense, which may obscure small masses. FINDINGS: There are no findings suspicious for malignancy. Images were processed with CAD. IMPRESSION: No mammographic evidence of malignancy. A result letter of this screening mammogram will be mailed directly to the patient. RECOMMENDATION: Screening mammogram in one year. (Code:SM-B-01Y) BI-RADS CATEGORY  1: Negative. Electronically Signed   By: Fidela Salisbury M.D.   On: 07/06/2016 16:56  Assessment & Plan:   Problem List Items Addressed This Visit    Hypertension - Primary    Elevated above new goals of 120/70.  Advised to suspend celebrex as a trial       Relevant Orders   Comprehensive metabolic panel (Completed)   Degenerative joint disease involving multiple joints    Left thumb joint pain and swelling improved s/p steroid injection by Dr Tamala Julian.  Discussed referral to hand surgeon at the appropriate time .  Continue current medication regimen excpet advised to suspend celebrex due to hypertension.  refislls on vicodin given for 3 months (March April and May)      Relevant Medications   HYDROcodone-acetaminophen (NORCO) 7.5-325 MG tablet   HYDROcodone-acetaminophen (NORCO) 7.5-325 MG tablet   HYDROcodone-acetaminophen (NORCO) 7.5-325 MG tablet   Major depressive disorder, single episode, moderate ([facility])    Aggravated by anxiety over career issues.  Did not tolerate hydoxyzine.  Low dose alprazolam prescribed for prn use not more than once daily       Relevant Medications   ALPRAZolam (XANAX) 0.25 MG tablet    Other Visit Diagnoses    Need for shingles vaccine        Relevant Orders   Varicella-zoster vaccine IM (Shingrix) (Completed)      I have discontinued Ragena D. Gerety's predniSONE, busPIRone, predniSONE, predniSONE, and HYDROcodone-acetaminophen. I have also changed her zolpidem, HYDROcodone-acetaminophen, HYDROcodone-acetaminophen, and HYDROcodone-acetaminophen. Additionally, I am having her start on ALPRAZolam. Lastly, I am having her maintain her aspirin EC, SYRINGE 3CC/25GX1", amLODipine, MULTIPLE VITAMIN PO, Probiotic Product (PROBIOTIC PO), buPROPion, pantoprazole, Vitamin D (Ergocalciferol), nebivolol, fluticasone, LINZESS, celecoxib, hydrOXYzine, and cyanocobalamin. We will continue to administer sodium chloride.  Meds ordered this encounter  Medications  . DISCONTD: HYDROcodone-acetaminophen (NORCO) 7.5-325 MG tablet    Sig: Take 1 tablet by mouth every 4 (four) hours as needed for moderate pain. May refill on or after April 09 2017    Dispense:  180 tablet    Refill:  0  . ALPRAZolam (XANAX) 0.25 MG tablet    Sig: Take 1 tablet (0.25 mg total) by mouth daily as needed for anxiety.    Dispense:  30 tablet    Refill:  2  . zolpidem (AMBIEN) 10 MG tablet    Sig: Take 1 tablet (10 mg total) by mouth at bedtime as needed. for sleep    Dispense:  30 tablet    Refill:  5    Not to exceed 4 additional fills before 06/27/2017  . HYDROcodone-acetaminophen (NORCO) 7.5-325 MG tablet    Sig: Take 1 tablet by mouth every 4 (four) hours as needed for moderate pain. May refill on or after May 04 2017    Dispense:  180 tablet    Refill:  0  . HYDROcodone-acetaminophen (NORCO) 7.5-325 MG tablet    Sig: Take 1 tablet by mouth every 4 (four) hours as needed for moderate pain. May refill on or after June 04 2017    Dispense:  180 tablet    Refill:  0  . HYDROcodone-acetaminophen (NORCO) 7.5-325 MG tablet    Sig: Take 1 tablet by mouth every 4 (four) hours as needed for moderate pain. May refill on or after Jul 04 2017    Dispense:  180  tablet    Refill:  0    Medications Discontinued During This Encounter  Medication Reason  . busPIRone (BUSPAR) 5 MG tablet Patient has not taken in last 30 days  . predniSONE (DELTASONE) 10 MG  tablet Completed Course  . predniSONE (DELTASONE) 50 MG tablet Completed Course  . predniSONE (DELTASONE) 50 MG tablet Completed Course  . HYDROcodone-acetaminophen (NORCO) 7.5-325 MG tablet Reorder  . HYDROcodone-acetaminophen (NORCO) 7.5-325 MG tablet   . zolpidem (AMBIEN) 10 MG tablet Reorder    Follow-up: Return in about 3 months (around 07/07/2017) for medication refill.   [name], MD

## 2017-04-09 NOTE — Patient Instructions (Addendum)
I have prescribed alprazolam to use not more than once daily for your anxiety  DO  Not COMBINE WITH ALCOHOL OR HYDROCODONE  .  DO NOT DRIVE WHEN YOU ARE USING IT

## 2017-04-10 LAB — COMPREHENSIVE METABOLIC PANEL
ALT: 17 U/L (ref 0–35)
AST: 18 U/L (ref 0–37)
Albumin: 4.6 g/dL (ref 3.5–5.2)
Alkaline Phosphatase: 57 U/L (ref 39–117)
BUN: 20 mg/dL (ref 6–23)
CO2: 28 mEq/L (ref 19–32)
Calcium: 9.9 mg/dL (ref 8.4–10.5)
Chloride: 101 mEq/L (ref 96–112)
Creatinine, Ser: 1.21 mg/dL — ABNORMAL HIGH (ref 0.40–1.20)
GFR: 48.04 mL/min — ABNORMAL LOW (ref >60.00)
Glucose, Bld: 90 mg/dL (ref 70–99)
Potassium: 4.3 mEq/L (ref 3.5–5.1)
Sodium: 136 mEq/L (ref 135–145)
Total Bilirubin: 0.3 mg/dL (ref 0.2–1.2)
Total Protein: 7 g/dL (ref 6.0–8.3)

## 2017-04-10 NOTE — Assessment & Plan Note (Signed)
Elevated above new goals of 120/70.  Advised to suspend celebrex as a trial

## 2017-04-10 NOTE — Assessment & Plan Note (Signed)
Left thumb joint pain and swelling improved s/p steroid injection by Dr Tamala Julian.  Discussed referral to hand surgeon at the appropriate time .  Continue current medication regimen excpet advised to suspend celebrex due to hypertension.  refislls on vicodin given for 3 months (March April and May)

## 2017-04-10 NOTE — Assessment & Plan Note (Addendum)
Aggravated by anxiety over career issues.  Did not tolerate hydoxyzine.  Low dose alprazolam prescribed for prn use not more than once daily

## 2017-04-13 ENCOUNTER — Other Ambulatory Visit: Payer: Self-pay | Admitting: Internal Medicine

## 2017-04-25 ENCOUNTER — Ambulatory Visit: Payer: 59 | Admitting: Internal Medicine

## 2017-04-25 ENCOUNTER — Telehealth: Payer: Self-pay | Admitting: Internal Medicine

## 2017-04-25 NOTE — Telephone Encounter (Signed)
Immunization records faxed to Denny Levy per the patient's request via Epic.to [phone].

## 2017-04-25 NOTE — Telephone Encounter (Signed)
Copied from [place] [id number]. Topic: General - Other >> Apr 25, 2017  1:22 PM Darl Householder, RMA wrote: Reason for CRM: Patient is requesting for a copy of immunization record to be faxed over to employer at fax number [phone] ATTN: Denny Levy

## 2017-05-20 ENCOUNTER — Other Ambulatory Visit: Payer: Self-pay | Admitting: Internal Medicine

## 2017-06-05 ENCOUNTER — Encounter: Payer: Self-pay | Admitting: Internal Medicine

## 2017-06-05 ENCOUNTER — Other Ambulatory Visit: Payer: Self-pay | Admitting: Internal Medicine

## 2017-06-05 NOTE — Telephone Encounter (Signed)
Copied from [place] [phone]. Topic: Quick Communication - See Telephone Encounter >> Jun 05, 2017  3:44 PM Cleaster Corin, NT wrote: CRM for notification. See Telephone encounter for: 06/05/17.  Prior auth. Needed for HYDROcodone-acetaminophen ([place]) 7.5-325 MG tablet [[id number]]   [id number] [place] [address] [place] [address] Phone: [phone] Fax: [phone]

## 2017-06-05 NOTE — Telephone Encounter (Signed)
Refilled: 04/09/2017 Last OV: 04/09/2017 Next OV: 07/03/2017

## 2017-06-05 NOTE — Telephone Encounter (Signed)
Refill for May 15 was given at her visit in February.  I cannot replace it

## 2017-06-06 NOTE — Telephone Encounter (Signed)
LMTCB. PEC may speak with pt.  

## 2017-06-06 NOTE — Telephone Encounter (Signed)
Patient needs a pre-authorization for med due to insurance changed. She would like a call back.

## 2017-06-11 ENCOUNTER — Telehealth: Payer: Self-pay | Admitting: Internal Medicine

## 2017-06-11 NOTE — Telephone Encounter (Signed)
Please advise 

## 2017-06-11 NOTE — Telephone Encounter (Signed)
Copied from [place] [id number]. Topic: Quick Communication - See Telephone Encounter >> Jun 11, 2017  4:09 PM Rutherford Nail, NT wrote: CRM for notification. See Telephone encounter for: 06/11/17. Patient calling to inquire about the Prior Authorization for HYDROcodone-acetaminophen ([place]) 7.5-325 MG tablet. Patient states that the last refill she received was on 06/05/16 and that was for 7 days. Patient changed jobs and [employer] to [facility] and is now needing a PA to receive the medication. 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 Please advise. CB#: [phone]

## 2017-06-12 NOTE — Telephone Encounter (Signed)
Patient calling to make sure this is being worked on. States she has not heard anything about this being completed yet and would like an update. Please advise. CB#: [phone]

## 2017-06-12 NOTE — Telephone Encounter (Signed)
Please advise 

## 2017-06-14 ENCOUNTER — Other Ambulatory Visit: Payer: Self-pay

## 2017-06-14 ENCOUNTER — Encounter: Payer: Self-pay | Admitting: Internal Medicine

## 2017-06-14 MED ORDER — HYDROCODONE-ACETAMINOPHEN 7.5-325 MG PO TABS: 1.0000 | ORAL_TABLET | ORAL | 0 refills | Status: DC | PRN

## 2017-06-14 NOTE — Telephone Encounter (Signed)
rx ok'd for #30 tablets with no refills.

## 2017-06-14 NOTE — Telephone Encounter (Signed)
Spoke with pharmacy and they stated that they would need a new prescription in order to give her a refill for another seven days.

## 2017-06-14 NOTE — Telephone Encounter (Signed)
See if pharmacy can refill since was given more tablets.  Let me know if a problem.

## 2017-06-14 NOTE — Telephone Encounter (Signed)
Refilled: 06/05/2017 was only able to get a seven day supply because of insurance. I am still working on the prior authorization to get the medication approved for a full month supply, just waiting on the pt to bring a copy of her insurance card by. She stated that she would be bringing it by this afternoon before 5pm. The pt is wanting to know if you would be willing to refill the rx for enough to get her through until Dr. Derrel Nip gets back in town.

## 2017-06-14 NOTE — Telephone Encounter (Signed)
rx has been printed, signed and given to pt.

## 2017-06-15 DIAGNOSIS — M159 Polyosteoarthritis, unspecified: Secondary | ICD-10-CM

## 2017-06-15 NOTE — Telephone Encounter (Signed)
Waiting on response from insurance company about prior authorization. Pt came by yesterday and picked up a rx for another 7 day supply until PA gets approved.

## 2017-06-15 NOTE — Telephone Encounter (Signed)
PA has been submitted now since we received the correct insurance information.

## 2017-06-15 NOTE — Telephone Encounter (Signed)
PA authorization has been approved for hydrocodone through 12/15/2017.

## 2017-06-20 ENCOUNTER — Other Ambulatory Visit: Payer: Self-pay | Admitting: Internal Medicine

## 2017-06-20 NOTE — Telephone Encounter (Signed)
Pt needs a new prescription for the Hydrocodone now since the prior authorization has been approved. She was given a seven day supply until we could get the prior authorization done.

## 2017-06-20 NOTE — Telephone Encounter (Signed)
Copied from [place] [phone]. Topic: Quick Communication - Rx Refill/Question >> Jun 20, 2017  9:38 AM Scherrie Gerlach wrote: Medication: HYDROcodone-acetaminophen ([place]) 7.5-325 MG tablet Has the patient contacted their pharmacy? no   [id number] [address], [place] (Phone) [phone] (Fax)  Pt aware Rx has been approved.  Requesting new Rx be sent to the pharmacy.  Pt would like a call back to advise if you were aboe to do that. Ok to leave message

## 2017-06-21 MED ORDER — HYDROCODONE-ACETAMINOPHEN 7.5-325 MG PO TABS: 1.0000 | ORAL_TABLET | ORAL | 0 refills | Status: DC | PRN

## 2017-06-21 NOTE — Telephone Encounter (Signed)
Spoke with pt to let her know that her rx has been placed up front for pick up. Pt stated that she would be by this afternoon to pick it up.

## 2017-06-25 ENCOUNTER — Other Ambulatory Visit: Payer: Self-pay | Admitting: Internal Medicine

## 2017-07-03 ENCOUNTER — Other Ambulatory Visit: Payer: Self-pay | Admitting: Internal Medicine

## 2017-07-03 ENCOUNTER — Encounter: Payer: Self-pay | Admitting: Internal Medicine

## 2017-07-03 ENCOUNTER — Ambulatory Visit: Payer: BLUE CROSS/BLUE SHIELD | Admitting: Internal Medicine

## 2017-07-03 DIAGNOSIS — F418 Other specified anxiety disorders: Secondary | ICD-10-CM

## 2017-07-03 DIAGNOSIS — G894 Chronic pain syndrome: Secondary | ICD-10-CM

## 2017-07-03 DIAGNOSIS — R194 Change in bowel habit: Secondary | ICD-10-CM

## 2017-07-03 DIAGNOSIS — R140 Abdominal distension (gaseous): Secondary | ICD-10-CM

## 2017-07-03 DIAGNOSIS — J0110 Acute frontal sinusitis, unspecified: Secondary | ICD-10-CM

## 2017-07-03 MED ORDER — PREDNISONE 10 MG PO TABS: ORAL_TABLET | ORAL | 0 refills | Status: DC

## 2017-07-03 MED ORDER — HYDROCODONE-ACETAMINOPHEN 7.5-325 MG PO TABS: 1.0000 | ORAL_TABLET | ORAL | 0 refills | Status: DC | PRN

## 2017-07-03 MED ORDER — AMOXICILLIN-POT CLAVULANATE 875-125 MG PO TABS: 1.0000 | ORAL_TABLET | Freq: Two times a day (BID) | ORAL | 0 refills | Status: DC

## 2017-07-03 MED ORDER — FLUCONAZOLE 150 MG PO TABS: 150.0000 mg | ORAL_TABLET | Freq: Every day | ORAL | 0 refills | Status: DC

## 2017-07-03 MED ORDER — ALPRAZOLAM 0.25 MG PO TABS: 0.2500 mg | ORAL_TABLET | Freq: Every day | ORAL | 0 refills | Status: DC | PRN

## 2017-07-03 NOTE — Progress Notes (Signed)
Subjective:  Patient ID: Adrienne Robinson, female    DOB: November 03, 1955  Age: 62 y.o. MRN: [id number]  CC: Diagnoses of Acute frontal sinusitis, recurrence not specified, Anxiety associated with depression, and Chronic pain syndrome were pertinent to this visit.  HPI Adrienne Robinson presents for follow upon chronic conditions  Sinusitis for 3 weeks now with right ear pain. Sinus drainage is blood streaked and purulent  Husband in iCU with DKA  And delirium. A total of 25 minutes of face to face time was spent with patient more than half of which was spent in counselling about the above mentioned conditions  and coordination of care    She has chronic neck and low back pain secondary to spinal stenosis , managed with hydrocodone and celebrex . Refill history confirmed via [place] Controlled Substance databas, accessed by me today..    Outpatient Medications Prior to Visit  Medication Sig Dispense Refill  . amLODipine (NORVASC) 10 MG tablet TAKE 1 TABLET (10 MG TOTAL) BY MOUTH DAILY. 90 tablet 3  . aspirin EC 81 MG tablet Take 81 mg by mouth daily.    Marland Kitchen buPROPion (WELLBUTRIN XL) 300 MG 24 hr tablet TAKE 1 TABLET BY MOUTH EVERY DAY 90 tablet 1  . BYSTOLIC 2.5 MG tablet TAKE 1 TABLET BY MOUTH EVERY DAY 90 tablet 1  . cyanocobalamin (,VITAMIN B-12,) 1000 MCG/ML injection INJECT 1 ML (1,000 MCG TOTAL) INTO THE MUSCLE ONCE A WEEK. 10 mL 0  . fluticasone (FLONASE) 50 MCG/ACT nasal spray USE 2 SPRAYS IN EACH NOSTRIL DAILY AS NEEDED 16 g 1  . HYDROcodone-acetaminophen (NORCO) 7.5-325 MG tablet Take 1 tablet by mouth every 4 (four) hours as needed for moderate pain. May refill now.  Was only given 7 day supply while waiting for PA. 30 tablet 0  . hydrOXYzine (ATARAX/VISTARIL) 25 MG tablet TAKE 1 TABLET (25 MG TOTAL) BY MOUTH EVERY 8 (EIGHT) HOURS AS NEEDED. FOR ANXIETY 90 tablet 1  . MULTIPLE VITAMIN PO Take 1 tablet by mouth daily.    . pantoprazole (PROTONIX) 40 MG tablet Take 1 tablet (40 mg total) by  mouth 2 (two) times daily. 180 tablet 3  . Probiotic Product (PROBIOTIC PO) Take 1 capsule by mouth daily.    . Syringe/Needle, Disp, (SYRINGE 3CC/25GX1") 25G X 1" 3 ML MISC Use for b12 injections 50 each 0  . Vitamin D, Ergocalciferol, (DRISDOL) 50000 units CAPS capsule TAKE ONE CAPSULE BY MOUTH EVERY 7 DAYS 12 capsule 0  . zolpidem (AMBIEN) 10 MG tablet Take 1 tablet (10 mg total) by mouth at bedtime as needed. for sleep 30 tablet 5  . HYDROcodone-acetaminophen (NORCO) 7.5-325 MG tablet Take 1 tablet by mouth every 4 (four) hours as needed for moderate pain. May refill on or after Jul 04 2017 180 tablet 0  . HYDROcodone-acetaminophen (NORCO) 7.5-325 MG tablet Take 1 tablet by mouth every 4 (four) hours as needed for moderate pain. May refill on or after June 04 2017 180 tablet 0  . LINZESS 290 MCG CAPS capsule TAKE 1 CAPSULE (290 MCG TOTAL) BY MOUTH DAILY BEFORE BREAKFAST. 90 capsule 0  . celecoxib (CELEBREX) 200 MG capsule TAKE 1 CAPSULE BY MOUTH EVERY DAY (Patient not taking: Reported on 07/03/2017) 30 capsule 5  . ALPRAZolam (XANAX) 0.25 MG tablet Take 1 tablet (0.25 mg total) by mouth daily as needed for anxiety. (Patient not taking: Reported on 07/03/2017) 30 tablet 2   Facility-Administered Medications Prior to Visit  Medication Dose Route Frequency  Provider Last Rate Last Dose  . 0.9 %  sodium chloride infusion  500 mL Intravenous Continuous Pyrtle, Lajuan Lines, MD        Review of Systems;  Patient denies headache, fevers, malaise, unintentional weight loss, skin rash, eye pain, sinus congestion and sinus pain, sore throat, dysphagia,  hemoptysis , cough, dyspnea, wheezing, chest pain, palpitations, orthopnea, edema, abdominal pain, nausea, melena, diarrhea, constipation, flank pain, dysuria, hematuria, urinary  Frequency, nocturia, numbness, tingling, seizures,  Focal weakness, Loss of consciousness,  Tremor, insomnia, depression, anxiety, and suicidal ideation.      Objective:  BP (!)  142/76 (BP Location: Left Arm, Patient Position: Sitting, Cuff Size: Normal)   Pulse 73   Temp 98.2 F (36.8 C) (Oral)   Resp 15   Ht 5\' 6"  (1.676 m)   Wt 116 lb 3.2 oz (52.7 kg)   SpO2 97%   BMI 18.76 kg/m   BP Readings from Last 3 Encounters:  07/03/17 (!) 142/76  04/09/17 138/84  02/21/17 130/72    Wt Readings from Last 3 Encounters:  07/03/17 116 lb 3.2 oz (52.7 kg)  04/09/17 123 lb 3.2 oz (55.9 kg)  02/21/17 124 lb (56.2 kg)    General appearance: alert, cooperative and appears stated age Ears: normal TM's and external ear canals both ears Throat: lips, mucosa, and tongue normal; teeth and gums normal Neck: no adenopathy, no carotid bruit, supple, symmetrical, trachea midline and thyroid not enlarged, symmetric, no tenderness/mass/nodules Back: symmetric, no curvature. ROM normal. No CVA tenderness. Lungs: clear to auscultation bilaterally Heart: regular rate and rhythm, S1, S2 normal, no murmur, click, rub or gallop Abdomen: soft, non-tender; bowel sounds normal; no masses,  no organomegaly Pulses: 2+ and symmetric Skin: Skin color, texture, turgor normal. No rashes or lesions Lymph nodes: Cervical, supraclavicular, and axillary nodes normal.  Lab Results  Component Value Date   HGBA1C 5.7 08/04/2011    Lab Results  Component Value Date   CREATININE 1.21 (H) 04/09/2017   CREATININE 1.17 10/16/2016   CREATININE 0.99 05/09/2016    Lab Results  Component Value Date   WBC 4.8 03/04/2015   HGB 10.9 (L) 03/04/2015   HCT 32.1 (L) 03/04/2015   PLT 291 03/04/2015   GLUCOSE 90 04/09/2017   CHOL 245 (H) 10/16/2016   TRIG 104.0 10/16/2016   HDL 68.60 10/16/2016   LDLDIRECT 160.2 04/11/2013   LDLCALC 156 (H) 10/16/2016   ALT 17 04/09/2017   AST 18 04/09/2017   NA 136 04/09/2017   K 4.3 04/09/2017   CL 101 04/09/2017   CREATININE 1.21 (H) 04/09/2017   BUN 20 04/09/2017   CO2 28 04/09/2017   TSH 0.72 08/06/2014   INR 0.94 03/04/2015   HGBA1C 5.7 08/04/2011     MICROALBUR <0.7 05/09/2016    Mm Screening Breast Tomo Bilateral  Result Date: 07/06/2016 CLINICAL DATA:  Screening. EXAM: 2D DIGITAL SCREENING BILATERAL MAMMOGRAM WITH CAD AND ADJUNCT TOMO COMPARISON:  Previous exam(s). ACR Breast Density Category c: The breast tissue is heterogeneously dense, which may obscure small masses. FINDINGS: There are no findings suspicious for malignancy. Images were processed with CAD. IMPRESSION: No mammographic evidence of malignancy. A result letter of this screening mammogram will be mailed directly to the patient. RECOMMENDATION: Screening mammogram in one year. (Code:SM-B-01Y) BI-RADS CATEGORY  1: Negative. Electronically Signed   By: Fidela Salisbury M.D.   On: 07/06/2016 16:56    Assessment & Plan:   Problem List Items Addressed This Visit  Sinusitis, acute frontal    Given chronicity of symptoms, development of facial pain and exam consistent with bacterial URI,  Will treat with empiric antibiotics, prednisone taper , and saline lavage.  Adding steroid nasal spray if not already taking.       Relevant Medications   predniSONE (DELTASONE) 10 MG tablet   amoxicillin-clavulanate (AUGMENTIN) 875-125 MG tablet   fluconazole (DIFLUCAN) 150 MG tablet   Chronic pain syndrome    She has chronic back pain Complicated by scoliosis. She is unable to sit for prolonged periods of time without increased pain.  She uses a standing desk at home. She continues to require 6 vicodin daily to manage her pain .  Refill history confirmed via [place] Controlled Substance database, accessed by me today..REfills for 3 months given .  Continue tizanidine for prn evning use given presence of muscle spasm involving the entire right paraspinus muscle region       Anxiety associated with depression    Refill of alprazolam for use during husband's critical illness.       Relevant Medications   ALPRAZolam (XANAX) 0.25 MG tablet      I have changed Adrienne Robinson  HYDROcodone-acetaminophen and HYDROcodone-acetaminophen. I am also having her start on predniSONE, amoxicillin-clavulanate, and fluconazole. Additionally, I am having her maintain her aspirin EC, SYRINGE 3CC/25GX1", MULTIPLE VITAMIN PO, Probiotic Product (PROBIOTIC PO), pantoprazole, Vitamin D (Ergocalciferol), fluticasone, celecoxib, hydrOXYzine, cyanocobalamin, zolpidem, BYSTOLIC, amLODipine, HYDROcodone-acetaminophen, buPROPion, and ALPRAZolam. We will continue to administer sodium chloride.  Meds ordered this encounter  Medications  . predniSONE (DELTASONE) 10 MG tablet    Sig: 6 tablets on Day 1 , then reduce by 1 tablet daily until gone    Dispense:  21 tablet    Refill:  0  . amoxicillin-clavulanate (AUGMENTIN) 875-125 MG tablet    Sig: Take 1 tablet by mouth 2 (two) times daily.    Dispense:  14 tablet    Refill:  0  . fluconazole (DIFLUCAN) 150 MG tablet    Sig: Take 1 tablet (150 mg total) by mouth daily.    Dispense:  2 tablet    Refill:  0  . HYDROcodone-acetaminophen (NORCO) 7.5-325 MG tablet    Sig: Take 1 tablet by mouth every 4 (four) hours as needed for moderate pain. May refill on or after July 21 2017    Dispense:  180 tablet    Refill:  0  . DISCONTD: HYDROcodone-acetaminophen (NORCO) 7.5-325 MG tablet    Sig: Take 1 tablet by mouth every 4 (four) hours as needed for moderate pain. May refill on or after August 20 2017    Dispense:  180 tablet    Refill:  0  . HYDROcodone-acetaminophen (NORCO) 7.5-325 MG tablet    Sig: Take 1 tablet by mouth every 4 (four) hours as needed for moderate pain. May refill on or after September 20 2017    Dispense:  180 tablet    Refill:  0  . ALPRAZolam (XANAX) 0.25 MG tablet    Sig: Take 1 tablet (0.25 mg total) by mouth daily as needed for anxiety.    Dispense:  30 tablet    Refill:  0    Medications Discontinued During This Encounter  Medication Reason  . HYDROcodone-acetaminophen (NORCO) 7.5-325 MG tablet Reorder  .  HYDROcodone-acetaminophen (NORCO) 7.5-325 MG tablet Reorder  . HYDROcodone-acetaminophen (NORCO) 7.5-325 MG tablet Reorder  . ALPRAZolam (XANAX) 0.25 MG tablet Reorder    Follow-up: No follow-ups on file.  [name], MD

## 2017-07-03 NOTE — Patient Instructions (Signed)
Adrienne Robinson is having delirium .  This is very common during DA due to all of the electrolyte derangements and acid base imbalances   I am treating you for sinusitis/otitis which isca complication from your viral infection due to  persistent sinus congestion.   I am prescribing an antibiotic (augmenti) and a prednisone taper  To manage the infection and the inflammation in your ear/sinuses.   I also advise use of the following OTC meds to help with your other symptoms.   Take generic OTC benadryl 25 mg every 8 hours for the drainage,   Afrin nasal spray a few days if  needed   flush your sinuses twice daily with Simply Saline (do over the sink because if you do it right you will spit out globs of mucus)  Use fluconazole for 2 days if you develop thrush   Refills on vicodin have been printed for June 1, July 1 and August 1

## 2017-07-04 ENCOUNTER — Encounter: Payer: Self-pay | Admitting: Internal Medicine

## 2017-07-04 DIAGNOSIS — R194 Change in bowel habit: Secondary | ICD-10-CM

## 2017-07-04 DIAGNOSIS — R140 Abdominal distension (gaseous): Secondary | ICD-10-CM

## 2017-07-04 MED ORDER — LINACLOTIDE 290 MCG PO CAPS: 290.0000 ug | ORAL_CAPSULE | Freq: Every day | ORAL | 2 refills | Status: DC

## 2017-07-05 NOTE — Assessment & Plan Note (Signed)
Given chronicity of symptoms, development of facial pain and exam consistent with bacterial URI,  Will treat with empiric antibiotics, prednisone taper , and saline lavage.  Adding steroid nasal spray if not already taking.

## 2017-07-05 NOTE — Assessment & Plan Note (Signed)
Refill of alprazolam for use during husband's critical illness.

## 2017-07-05 NOTE — Assessment & Plan Note (Signed)
She has chronic back pain Complicated by scoliosis. She is unable to sit for prolonged periods of time without increased pain.  She uses a standing desk at home. She continues to require 6 vicodin daily to manage her pain .  Refill history confirmed via [place] Controlled Substance database, accessed by me today..REfills for 3 months given .  Continue tizanidine for prn evning use given presence of muscle spasm involving the entire right paraspinus muscle region  

## 2017-07-11 ENCOUNTER — Encounter: Payer: Self-pay | Admitting: Internal Medicine

## 2017-07-11 MED ORDER — PREDNISONE 10 MG PO TABS: ORAL_TABLET | ORAL | 0 refills | Status: DC

## 2017-07-11 NOTE — Telephone Encounter (Signed)
Can you do a PA for Linzess and twice daily pantoprazole?

## 2017-07-22 ENCOUNTER — Encounter: Payer: Self-pay | Admitting: Internal Medicine

## 2017-08-08 ENCOUNTER — Encounter: Payer: Self-pay | Admitting: Internal Medicine

## 2017-08-08 MED ORDER — METOPROLOL SUCCINATE ER 25 MG PO TB24: 25.0000 mg | ORAL_TABLET | Freq: Every day | ORAL | 3 refills | Status: DC

## 2017-08-08 NOTE — Telephone Encounter (Signed)
Please  see if we can get PA for Linzess

## 2017-08-17 ENCOUNTER — Other Ambulatory Visit: Payer: Self-pay | Admitting: Internal Medicine

## 2017-08-21 ENCOUNTER — Other Ambulatory Visit: Payer: Self-pay | Admitting: Internal Medicine

## 2017-08-21 ENCOUNTER — Telehealth: Payer: Self-pay

## 2017-08-21 DIAGNOSIS — Z791 Long term (current) use of non-steroidal anti-inflammatories (NSAID): Secondary | ICD-10-CM

## 2017-08-21 MED ORDER — CELECOXIB 200 MG PO CAPS: ORAL_CAPSULE | ORAL | 5 refills | Status: DC

## 2017-08-21 NOTE — Telephone Encounter (Signed)
mychart message sent

## 2017-08-21 NOTE — Telephone Encounter (Signed)
PA was done and covermymeds stated that this medication is covered by pt's insurance and PA is not required. Called pt to let her know and she stated that the pharmacy is still telling her that a PA is needed. Told pt I would give the pharmacy a call this morning and then call her back. Pt gave a verbal understanding.

## 2017-08-21 NOTE — Telephone Encounter (Signed)
LMTCB. Need to let pt know that we called the pharmacy and they are aware that a prior authorization is not needed for the medication how ever it it going to cost $165. PEC may speak with pt.

## 2017-08-21 NOTE — Telephone Encounter (Signed)
Spoke with pt and she is wanting to know if celebrex can be refilled. Pt stated that she was taken off of it for a little while but she is having a lot of arthritis pain in both her hands and legs.

## 2017-08-21 NOTE — Telephone Encounter (Signed)
My Chart message sent

## 2017-08-29 ENCOUNTER — Encounter: Payer: Self-pay | Admitting: Internal Medicine

## 2017-08-30 ENCOUNTER — Telehealth: Payer: Self-pay | Admitting: Internal Medicine

## 2017-08-30 NOTE — Telephone Encounter (Signed)
please handle the linzess PA update

## 2017-09-03 ENCOUNTER — Encounter: Payer: Self-pay | Admitting: Internal Medicine

## 2017-09-03 NOTE — Telephone Encounter (Signed)
Attempted to call pt in regards to the PA for the [facility] on 08/21/2017, no answer and no voicemail. Sent pt a Pharmacist, community message stating the we had attempted to call and left a message for her to call us back to let her know that the PA was not needed but that the medication would cost her $165 per pharmacy.

## 2017-09-04 MED ORDER — DIAZEPAM 10 MG PO TABS: ORAL_TABLET | ORAL | 0 refills | Status: DC

## 2017-09-15 ENCOUNTER — Other Ambulatory Visit: Payer: Self-pay | Admitting: Physician Assistant

## 2017-09-21 ENCOUNTER — Telehealth: Payer: Self-pay | Admitting: *Deleted

## 2017-09-21 NOTE — Telephone Encounter (Signed)
lmovm for pt to return call.  

## 2017-09-21 NOTE — Telephone Encounter (Signed)
Copied from [place] [phone]. Topic: Appointment Scheduling - Scheduling Inquiry for Clinic >> Sep 20, 2017  3:43 PM Oliver Pila B wrote: Reason for CRM: pt needs an appt for an inj in the thumb and back manipulation; pt needs an appt after 3:30pm

## 2017-09-24 ENCOUNTER — Ambulatory Visit (INDEPENDENT_AMBULATORY_CARE_PROVIDER_SITE_OTHER): Payer: BLUE CROSS/BLUE SHIELD | Admitting: Internal Medicine

## 2017-09-24 ENCOUNTER — Encounter: Payer: Self-pay | Admitting: Internal Medicine

## 2017-09-24 VITALS — BP 142/80 | HR 64 | Temp 98.5°F | Resp 15 | Ht 66.0 in | Wt 119.0 lb

## 2017-09-24 DIAGNOSIS — K2100 Gastro-esophageal reflux disease with esophagitis, without bleeding: Secondary | ICD-10-CM

## 2017-09-24 DIAGNOSIS — M48062 Spinal stenosis, lumbar region with neurogenic claudication: Secondary | ICD-10-CM

## 2017-09-24 DIAGNOSIS — I10 Essential (primary) hypertension: Secondary | ICD-10-CM | POA: Diagnosis not present

## 2017-09-24 MED ORDER — "SYRINGE 25G X 1"" 3 ML MISC": 0 refills | Status: DC

## 2017-09-24 MED ORDER — LISINOPRIL 2.5 MG PO TABS: 2.5000 mg | ORAL_TABLET | Freq: Every day | ORAL | 2 refills | Status: DC

## 2017-09-24 MED ORDER — HYDROCODONE-ACETAMINOPHEN 7.5-325 MG PO TABS: 1.0000 | ORAL_TABLET | ORAL | 0 refills | Status: DC | PRN

## 2017-09-24 MED ORDER — DIAZEPAM 10 MG PO TABS: ORAL_TABLET | ORAL | 0 refills | Status: DC

## 2017-09-24 MED ORDER — CYANOCOBALAMIN 1000 MCG/ML IJ SOLN: 1000.0000 ug | INTRAMUSCULAR | 11 refills | Status: DC

## 2017-09-24 MED ORDER — PANTOPRAZOLE SODIUM 40 MG PO TBEC: 40.0000 mg | DELAYED_RELEASE_TABLET | Freq: Two times a day (BID) | ORAL | 3 refills | Status: DC

## 2017-09-24 MED ORDER — OMEPRAZOLE 40 MG PO CPDR: 40.0000 mg | DELAYED_RELEASE_CAPSULE | Freq: Every day | ORAL | 3 refills | Status: DC

## 2017-09-24 NOTE — Progress Notes (Signed)
[facility] [address], [place] [address] Phone: [phone] Subjective:       CC: Left thumb pain, back pain  AJO:INOMVEHMCN  Adrienne Robinson is a 62 y.o. female coming in with complaint of left thumb and back pain. States the thumb is very painful today. Patient was found to have arthritic changes.  Was given an injection back in January.  Patient states it did help for the last 6 months and started having worsening pain again.  Patient was having back pain.  Attempted osteopathic manipulation.  Was going to be seen regularly but husband got sick and needed significant amount of medical treatment.  Has not been able to do the exercises regularly.    Past Medical History:  Diagnosis Date  . Allergy   . Arthritis   . 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 B12 deficiency 01/2016  . Candida esophagitis ([place])   . Degenerative joint disease involving multiple joints   . Depression   . Dizziness   . Hypertension   . Pneumonia    approx 6 years ago  . Scoliosis   . Spinal stenosis of lumbar region   . Tubular adenoma of colon    Past Surgical History:  Procedure Laterality Date  . ABDOMINAL HYSTERECTOMY    . APPENDECTOMY    . CARDIAC CATHETERIZATION     normal coronaries, no wall motion abnormalities 11/02/09 [facility])  . NASAL SINUS SURGERY    . OOPHORECTOMY    . RADIOLOGY WITH ANESTHESIA  02/26/2012   Procedure: RADIOLOGY WITH ANESTHESIA;  Surgeon: [name], MD;  Location: [place];  Service: Radiology;  Laterality: N/A;  . REVERSE SHOULDER ARTHROPLASTY Right 06/27/2013   Procedure: REVERSE RIGHT TOTAL SHOULDER ARTHROPLASTY;  Surgeon: [name], MD;  Location: [place];  Service: Orthopedics;  Laterality: Right;  . SHOULDER ARTHROSCOPY WITH ROTATOR CUFF REPAIR AND SUBACROMIAL DECOMPRESSION  2007   left  . TONSILLECTOMY  adnoids   Social History   Socioeconomic History  .  Marital status: Married    Spouse name: Not on file  . Number of children: 0  . Years of education: 34  . Highest education level: Not on file  Occupational History  . Occupation: Warden/ranger   Social Needs  . Financial resource strain: Not on file  . Food insecurity:    Worry: Not on file    Inability: Not on file  . Transportation needs:    Medical: Not on file    Non-medical: Not on file  Tobacco Use  . Smoking status: Never Smoker  . Smokeless tobacco: Never Used  Substance and Sexual Activity  . Alcohol use: Yes    Alcohol/week: 1.2 oz    Types: 2 Glasses of wine per week    Comment: 2-3 week with dinner  . Drug use: No  . Sexual activity: Not on file  Lifestyle  . Physical activity:    Days per week: Not on file    Minutes per session: Not on file  . Stress: Not on file  Relationships  . Social connections:    Talks on phone: Not on file    Gets together: Not on file    Attends religious service: Not on file    Active member of club or organization: Not on file    Attends meetings of clubs or organizations: Not on file    Relationship  status: Not on file  Other Topics Concern  . Not on file  Social History Narrative   Lives at home with her husband.   Right-handed.   2-3 diet cokes per day.   Allergies  Allergen Reactions  . Morphine And Related Rash  . Adhesive [Tape] Rash and Other (See Comments)    Redness, blisters, skin peeling off.   Family History  Problem Relation Age of Onset  . Colon cancer Father 43  . Diabetes Father   . Heart disease Father   . Rectal cancer Father   . Thyroid disease Mother   . Colon cancer Paternal Grandfather   . Colon cancer Other        pggm  . Breast cancer Paternal Grandmother   . Stomach cancer Neg Hx      Past medical history, social, surgical and family history all reviewed in electronic medical record.  No pertanent information unless stated regarding to the chief complaint.   Review of  Systems:Review of systems updated and as accurate as of 09/24/17  No headache, visual changes, nausea, vomiting, diarrhea, constipation, dizziness, abdominal pain, skin rash, fevers, chills, night sweats, weight loss, swollen lymph nodes, body aches, joint swelling, chest pain, shortness of breath, mood changes.  Positive muscle aches  Objective  There were no vitals taken for this visit. Systems examined below as of 09/24/17   General: No apparent distress alert and oriented x3 mood and affect normal, dressed appropriately.  HEENT: Pupils equal, extraocular movements intact  Respiratory: Patient's speak in full sentences and does not appear short of breath  Cardiovascular: No lower extremity edema, non tender, no erythema  Skin: Warm dry intact with no signs of infection or rash on extremities or on axial skeleton.  Abdomen: Soft nontender  Neuro: Cranial nerves II through XII are intact, neurovascularly intact in all extremities with 2+ DTRs and 2+ pulses.  Lymph: No lymphadenopathy of posterior or anterior cervical chain or axillae bilaterally.  Gait normal with good balance and coordination.  MSK:  Non tender with full range of motion and good stability and symmetric strength and tone of shoulders, elbows,  hip, knee and ankles bilaterally.  Left thumb exam shows severe arthritic changes.  Patient has a bone spur though more on the dorsal aspect of the thumb.  Back Exam:  Inspection: Degenerative scoliosis noted Motion: Flexion 40 deg, Extension 25 deg, Side Bending to 25 deg bilaterally,  Rotation to 35 deg bilaterally  SLR laying: Negative  XSLR laying: Negative  Palpable tenderness: Tender to palpation in the paraspinal musculature lumbar spine.Marland Kitchen FABER: negative. Sensory change: Gross sensation intact to all lumbar and sacral dermatomes.  Reflexes: 2+ at both patellar tendons, 2+ at achilles tendons, Babinski's downgoing.  Strength at foot  4-5 but symmetric   Procedure:  Real-time Ultrasound Guided Injection of left CMC joint Device: GE Logiq Q7 Ultrasound guided injection is preferred based studies that show increased duration, increased effect, greater accuracy, decreased procedural pain, increased response rate, and decreased cost with ultrasound guided versus blind injection.  Verbal informed consent obtained.  Time-out conducted.  Noted no overlying erythema, induration, or other signs of local infection.  Skin prepped in a sterile fashion.  Local anesthesia: Topical Ethyl chloride.  With sterile technique and under real time ultrasound guidance: With a 25-gauge epidural needle was injected with 0.5 cc of 0.5% Marcaine and 0.5 cc of Kenalog 40 mg/mL Completed without difficulty  Pain immediately resolved suggesting accurate placement of the medication.  Advised to call if fevers/chills, erythema, induration, drainage, or persistent bleeding.  Images permanently stored and available for review in the ultrasound unit.  Impression: Technically successful ultrasound guided injection  Osteopathic findings  T9 extended rotated and side bent left L2 flexed rotated and side bent right Sacrum left on left  .   Impression and Recommendations:     This case required medical decision making of moderate complexity.      Note: This dictation was prepared with Dragon dictation along with smaller phrase technology. Any transcriptional errors that result from this process are unintentional.

## 2017-09-24 NOTE — Patient Instructions (Signed)
Resume lisinopril starting at 2.5 gm daily dose  Stop the metoprolol  Increase the lisinopril to 5 mg if needed to keep your blood pressure  < 130/   Changing Nexium to full strength omeprazole for  The afternoon PPI dose.

## 2017-09-24 NOTE — Progress Notes (Signed)
Subjective:  Patient ID: Adrienne Robinson, female    DOB: May 23, 1955  Age: 62 y.o. MRN: [id number]  CC: The primary encounter diagnosis was Essential hypertension. Diagnoses of Spinal stenosis of lumbar region with neurogenic claudication and Chronic reflux esophagitis were also pertinent to this visit.  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  She has a history of panic/anxiety necessitating use of valium for MRI    GERD: using nexium and pantoprazole since insurance would  not pay for Protonix bid.  Having dyspepsia and excessive bloating.   Outpatient Medications Prior to Visit  Medication Sig Dispense Refill  . amLODipine (NORVASC) 10 MG tablet TAKE 1 TABLET (10 MG TOTAL) BY MOUTH DAILY. 90 tablet 3  . aspirin EC 81 MG tablet Take 81 mg by mouth daily.    Marland Kitchen buPROPion (WELLBUTRIN XL) 300 MG 24 hr tablet TAKE 1 TABLET BY MOUTH EVERY DAY 90 tablet 1  . celecoxib (CELEBREX) 200 MG capsule TAKE 1 CAPSULE BY MOUTH EVERY DAY 30 capsule 5  . fluticasone (FLONASE) 50 MCG/ACT nasal spray USE 2 SPRAYS IN EACH NOSTRIL DAILY AS NEEDED 16 g 1  . hydrOXYzine (ATARAX/VISTARIL) 25 MG tablet TAKE 1 TABLET (25 MG TOTAL) BY MOUTH EVERY 8 (EIGHT) HOURS AS NEEDED. FOR ANXIETY 90 tablet 1  . MULTIPLE VITAMIN PO Take 1 tablet by mouth daily.    . Probiotic Product (PROBIOTIC PO) Take 1 capsule by mouth daily.    Marland Kitchen zolpidem (AMBIEN) 10 MG tablet Take 1 tablet (10 mg total) by mouth at bedtime as needed. for sleep 30 tablet 5  . cyanocobalamin (,VITAMIN B-12,) 1000 MCG/ML injection INJECT 1 ML (1,000 MCG TOTAL) INTO THE MUSCLE ONCE A WEEK. 10 mL 0  . HYDROcodone-acetaminophen (NORCO) 7.5-325 MG tablet Take 1 tablet by mouth every 4 (four) hours  as needed for moderate pain. May refill now.  Was only given 7 day supply while waiting for PA. 30 tablet 0  . HYDROcodone-acetaminophen (NORCO) 7.5-325 MG tablet Take 1 tablet by mouth every 4 (four) hours as needed for moderate pain. May refill on or after July 21 2017 180 tablet 0  . HYDROcodone-acetaminophen (NORCO) 7.5-325 MG tablet Take 1 tablet by mouth every 4 (four) hours as needed for moderate pain. May refill on or after September 20 2017 180 tablet 0  . metoprolol succinate (TOPROL-XL) 25 MG 24 hr tablet Take 1 tablet (25 mg total) by mouth daily. (Patient not taking: Reported on 09/25/2017) 90 tablet 3  . pantoprazole (PROTONIX) 40 MG tablet Take 1 tablet (40 mg total) by mouth 2 (two) times daily. 180 tablet 3  . Syringe/Needle, Disp, (SYRINGE 3CC/25GX1") 25G X 1" 3 ML MISC Use for b12 injections 50 each 0  . ALPRAZolam (XANAX) 0.25 MG tablet Take 1 tablet (0.25 mg total) by mouth daily as needed for anxiety. (Patient not taking: Reported on 09/24/2017) 30 tablet 0  . amoxicillin-clavulanate (AUGMENTIN) 875-125 MG tablet Take 1 tablet by mouth 2 (two) times daily. (Patient not taking: Reported on 09/24/2017) 14 tablet 0  . diazepam (VALIUM) 10 MG tablet Take one tablet one hour prior to procedure (Patient not taking: Reported on 09/24/2017) 5 tablet 0  . fluconazole (  DIFLUCAN) 150 MG tablet Take 1 tablet (150 mg total) by mouth daily. (Patient not taking: Reported on 09/24/2017) 2 tablet 0  . linaclotide (LINZESS) 290 MCG CAPS capsule Take 1 capsule (290 mcg total) by mouth daily before breakfast. (Patient not taking: Reported on 09/24/2017) 90 capsule 2  . predniSONE (DELTASONE) 10 MG tablet 6 tablets on Day 1 , then reduce by 1 tablet daily until gone (Patient not taking: Reported on 09/24/2017) 21 tablet 0  . predniSONE (DELTASONE) 10 MG tablet 6 tablets on Day 1 , then reduce by 1 tablet daily until gone (Patient not taking: Reported on 09/24/2017) 21 tablet 0  . Vitamin D, Ergocalciferol, (DRISDOL) 50000  units CAPS capsule TAKE ONE CAPSULE BY MOUTH EVERY 7 DAYS (Patient not taking: Reported on 09/24/2017) 12 capsule 0   Facility-Administered Medications Prior to Visit  Medication Dose Route Frequency Provider Last Rate Last Dose  . 0.9 %  sodium chloride infusion  500 mL Intravenous Continuous Pyrtle, Lajuan Lines, MD        Review of Systems;  Patient denies headache, fevers, malaise, unintentional weight loss, skin rash, eye pain, sinus congestion and sinus pain, sore throat, dysphagia,  hemoptysis , cough, dyspnea, wheezing, chest pain, palpitations, orthopnea, edema, abdominal pain, nausea, melena, diarrhea, constipation, flank pain, dysuria, hematuria, urinary  Frequency, nocturia, numbness, tingling, seizures,  Focal weakness, Loss of consciousness,  Tremor, insomnia, depression, anxiety, and suicidal ideation.      Objective:  BP (!) 142/80 (BP Location: Left Arm, Patient Position: Sitting, Cuff Size: Normal)   Pulse 64   Temp 98.5 F (36.9 C) (Oral)   Resp 15   Ht 5\' 6"  (1.676 m)   Wt 119 lb (54 kg)   SpO2 97%   BMI 19.21 kg/m   BP Readings from Last 3 Encounters:  09/25/17 138/70  09/24/17 (!) 142/80  07/03/17 (!) 142/76    Wt Readings from Last 3 Encounters:  09/25/17 118 lb (53.5 kg)  09/24/17 119 lb (54 kg)  07/03/17 116 lb 3.2 oz (52.7 kg)    General appearance: alert, cooperative and appears stated age Ears: normal TM's and external ear canals both ears Throat: lips, mucosa, and tongue normal; teeth and gums normal Neck: no adenopathy, no carotid bruit, supple, symmetrical, trachea midline and thyroid not enlarged, symmetric, no tenderness/mass/nodules Back: symmetric, no curvature. ROM normal. No CVA tenderness. Lungs: clear to auscultation bilaterally Heart: regular rate and rhythm, S1, S2 normal, no murmur, click, rub or gallop Abdomen: soft, non-tender; bowel sounds normal; no masses,  no organomegaly Pulses: 2+ and symmetric Skin: Skin color, texture, turgor  normal. No rashes or lesions Lymph nodes: Cervical, supraclavicular, and axillary nodes normal.  Lab Results  Component Value Date   HGBA1C 5.7 08/04/2011    Lab Results  Component Value Date   CREATININE 1.07 09/24/2017   CREATININE 1.21 (H) 04/09/2017   CREATININE 1.17 10/16/2016    Lab Results  Component Value Date   WBC 4.8 03/04/2015   HGB 10.9 (L) 03/04/2015   HCT 32.1 (L) 03/04/2015   PLT 291 03/04/2015   GLUCOSE 94 09/24/2017   CHOL 245 (H) 10/16/2016   TRIG 104.0 10/16/2016   HDL 68.60 10/16/2016   LDLDIRECT 160.2 04/11/2013   LDLCALC 156 (H) 10/16/2016   ALT 13 09/24/2017   AST 16 09/24/2017   NA 132 (L) 09/24/2017   K 4.2 09/24/2017   CL 98 09/24/2017   CREATININE 1.07 09/24/2017   BUN 28 (H) 09/24/2017  CO2 26 09/24/2017   TSH 0.72 08/06/2014   INR 0.94 03/04/2015   HGBA1C 5.7 08/04/2011   MICROALBUR <0.7 05/09/2016    Mm Screening Breast Tomo Bilateral    Assessment & Plan:   Problem List Items Addressed This Visit    Hypertension - Primary    Not at goal.  adding 2.5 mg lisinopril (starting dose) ,  stopping metoprolol      Relevant Medications   lisinopril (ZESTRIL) 2.5 MG tablet   Other Relevant Orders   Comprehensive metabolic panel (Completed)   Spinal stenosis of lumbar region    She has chronic back pain Complicated by scoliosis. She is unable to sit for prolonged periods of time without increased pain.  She uses a standing desk at home. She continues to require 6 vicodin daily to manage her pain .  Refill history confirmed via [place] Controlled Substance database, accessed by me today..REfills for 3 months given .  Continue tizanidine for prn evning use given presence of muscle spasm involving the entire right paraspinus muscle region       Chronic reflux esophagitis    Managed with twice daily protonix until recently.  nexium has caused bloating that is not tolerated,  Trial of omeprazole 40 mg to protonix 40 mg        A total of 25  minutes of face to face time was spent with patient more than half of which was spent in counselling about the above mentioned conditions  and coordination of care   I have discontinued Shantoya D. Goecke's Vitamin D (Ergocalciferol), predniSONE, amoxicillin-clavulanate, fluconazole, HYDROcodone-acetaminophen, linaclotide, predniSONE, and HYDROcodone-acetaminophen. I have also changed her HYDROcodone-acetaminophen. Additionally, I am having her start on lisinopril. Lastly, I am having her maintain her aspirin EC, MULTIPLE VITAMIN PO, Probiotic Product (PROBIOTIC PO), fluticasone, zolpidem, amLODipine, buPROPion, hydrOXYzine, celecoxib, diazepam, pantoprazole, omeprazole, cyanocobalamin, and SYRINGE 3CC/25GX1". We will continue to administer sodium chloride.  Meds ordered this encounter  Medications  . diazepam (VALIUM) 10 MG tablet    Sig: Take one tablet one hour prior to procedure    Dispense:  5 tablet    Refill:  0  . DISCONTD: cyanocobalamin (,VITAMIN B-12,) 1000 MCG/ML injection    Sig: Inject 1 mL (1,000 mcg total) into the muscle once a week.    Dispense:  10 mL    Refill:  11  . DISCONTD: Syringe/Needle, Disp, (SYRINGE 3CC/25GX1") 25G X 1" 3 ML MISC    Sig: Use for b12 injections    Dispense:  50 each    Refill:  0  . lisinopril (ZESTRIL) 2.5 MG tablet    Sig: Take 1 tablet (2.5 mg total) by mouth daily.    Dispense:  90 tablet    Refill:  2  . DISCONTD: omeprazole (PRILOSEC) 40 MG capsule    Sig: Take 1 capsule (40 mg total) by mouth daily.    Dispense:  30 capsule    Refill:  3  . pantoprazole (PROTONIX) 40 MG tablet    Sig: Take 1 tablet (40 mg total) by mouth 2 (two) times daily.    Dispense:  90 tablet    Refill:  3  . omeprazole (PRILOSEC) 40 MG capsule    Sig: Take 1 capsule (40 mg total) by mouth daily.    Dispense:  30 capsule    Refill:  3  . DISCONTD: HYDROcodone-acetaminophen (NORCO) 7.5-325 MG tablet    Sig: Take 1 tablet by mouth every 4 (four) hours as needed  for moderate pain. May refill on or after October 20 2107    Dispense:  180 tablet    Refill:  0  . DISCONTD: HYDROcodone-acetaminophen (NORCO) 7.5-325 MG tablet    Sig: Take 1 tablet by mouth every 4 (four) hours as needed for moderate pain. May refill on or after November 20 2107    Dispense:  180 tablet    Refill:  0  . HYDROcodone-acetaminophen (NORCO) 7.5-325 MG tablet    Sig: Take 1 tablet by mouth every 4 (four) hours as needed for moderate pain. May refill on or after December 19, 2017    Dispense:  180 tablet    Refill:  0  . cyanocobalamin (,VITAMIN B-12,) 1000 MCG/ML injection    Sig: Inject 1 mL (1,000 mcg total) into the muscle once a week.    Dispense:  10 mL    Refill:  11  . Syringe/Needle, Disp, (SYRINGE 3CC/25GX1") 25G X 1" 3 ML MISC    Sig: Use for b12 injections    Dispense:  50 each    Refill:  0    Medications Discontinued During This Encounter  Medication Reason  . amoxicillin-clavulanate (AUGMENTIN) 875-125 MG tablet Completed Course  . fluconazole (DIFLUCAN) 150 MG tablet Completed Course  . linaclotide (LINZESS) 290 MCG CAPS capsule Cost of medication  . predniSONE (DELTASONE) 10 MG tablet Completed Course  . predniSONE (DELTASONE) 10 MG tablet Completed Course  . Vitamin D, Ergocalciferol, (DRISDOL) 50000 units CAPS capsule Completed Course  . diazepam (VALIUM) 10 MG tablet Reorder  . cyanocobalamin (,VITAMIN B-12,) 1000 MCG/ML injection Reorder  . pantoprazole (PROTONIX) 40 MG tablet Reorder  . omeprazole (PRILOSEC) 40 MG capsule Reorder  . HYDROcodone-acetaminophen (NORCO) 7.5-325 MG tablet Reorder  . HYDROcodone-acetaminophen (NORCO) 7.5-325 MG tablet Reorder  . HYDROcodone-acetaminophen (NORCO) 7.5-325 MG tablet Reorder  . cyanocobalamin (,VITAMIN B-12,) 1000 MCG/ML injection Reorder  . Syringe/Needle, Disp, (SYRINGE 3CC/25GX1") 25G X 1" 3 ML MISC Reorder    Follow-up: Return in about 3 months (around 12/25/2017) for med refill .   [name], MD

## 2017-09-25 ENCOUNTER — Encounter: Payer: Self-pay | Admitting: Family Medicine

## 2017-09-25 ENCOUNTER — Ambulatory Visit (INDEPENDENT_AMBULATORY_CARE_PROVIDER_SITE_OTHER)
Admission: RE | Admit: 2017-09-25 | Discharge: 2017-09-25 | Disposition: A | Discharge status: Home / Self Care | Payer: BLUE CROSS/BLUE SHIELD | Source: Ambulatory Visit | Attending: Family Medicine | Admitting: Family Medicine

## 2017-09-25 ENCOUNTER — Ambulatory Visit: Payer: Self-pay

## 2017-09-25 ENCOUNTER — Ambulatory Visit (INDEPENDENT_AMBULATORY_CARE_PROVIDER_SITE_OTHER): Payer: BLUE CROSS/BLUE SHIELD | Admitting: Family Medicine

## 2017-09-25 VITALS — BP 138/70 | HR 64 | Ht 66.0 in | Wt 118.0 lb

## 2017-09-25 DIAGNOSIS — M999 Biomechanical lesion, unspecified: Secondary | ICD-10-CM | POA: Diagnosis not present

## 2017-09-25 DIAGNOSIS — M1812 Unilateral primary osteoarthritis of first carpometacarpal joint, left hand: Secondary | ICD-10-CM

## 2017-09-25 DIAGNOSIS — M79646 Pain in unspecified finger(s): Secondary | ICD-10-CM | POA: Diagnosis not present

## 2017-09-25 DIAGNOSIS — M48062 Spinal stenosis, lumbar region with neurogenic claudication: Secondary | ICD-10-CM

## 2017-09-25 DIAGNOSIS — M19042 Primary osteoarthritis, left hand: Secondary | ICD-10-CM | POA: Diagnosis not present

## 2017-09-25 LAB — COMPREHENSIVE METABOLIC PANEL
ALT: 13 U/L (ref 0–35)
AST: 16 U/L (ref 0–37)
Albumin: 4.7 g/dL (ref 3.5–5.2)
Alkaline Phosphatase: 60 U/L (ref 39–117)
BUN: 28 mg/dL — ABNORMAL HIGH (ref 6–23)
CO2: 26 mEq/L (ref 19–32)
Calcium: 9.8 mg/dL (ref 8.4–10.5)
Chloride: 98 mEq/L (ref 96–112)
Creatinine, Ser: 1.07 mg/dL (ref 0.40–1.20)
GFR: 55.28 mL/min — ABNORMAL LOW (ref >60.00)
Glucose, Bld: 94 mg/dL (ref 70–99)
Potassium: 4.2 mEq/L (ref 3.5–5.1)
Sodium: 132 mEq/L — ABNORMAL LOW (ref 135–145)
Total Bilirubin: 0.3 mg/dL (ref 0.2–1.2)
Total Protein: 7 g/dL (ref 6.0–8.3)

## 2017-09-25 NOTE — Assessment & Plan Note (Addendum)
Not at goal.  adding 2.5 mg lisinopril (starting dose) ,  stopping metoprolol

## 2017-09-25 NOTE — Assessment & Plan Note (Signed)
Decision today to treat with OMT was based on Physical Exam  After verbal consent patient was treated with HVLA, ME, FPR techniques in  thoracic, lumbar and sacral areas  Patient tolerated the procedure well with improvement in symptoms  Patient given exercises, stretches and lifestyle modifications  See medications in patient instructions if given  Patient will follow up in 4 weeks 

## 2017-09-25 NOTE — Assessment & Plan Note (Signed)
Managed with twice daily protonix until recently.  nexium has caused bloating that is not tolerated,  Trial of omeprazole 40 mg to protonix 40 mg

## 2017-09-25 NOTE — Assessment & Plan Note (Signed)
No spinal stenosis.  History of ulcerative colitis.  Discussed icing regimen, home exercise, which activities to do which wants to avoid.  Increase activity as tolerated.  Follow-up again in 4 to 6 weeks

## 2017-09-25 NOTE — Assessment & Plan Note (Signed)
Repeat injection today.  Tolerated the procedure well.  Discussed icing regimen and home exercise.  Discussed which activities of doing which wants to avoid.  Increase activity as tolerated.  Follow-up again in weeks

## 2017-09-25 NOTE — Patient Instructions (Signed)
Good to see you  Ice 20 minutes 2 times daily. Usually after activity and before bed. Exercises 3 times a week.  Xray of thumb downstairs See me again in 3-4 weeks

## 2017-09-25 NOTE — Assessment & Plan Note (Signed)
She has chronic back pain Complicated by scoliosis. She is unable to sit for prolonged periods of time without increased pain.  She uses a standing desk at home. She continues to require 6 vicodin daily to manage her pain .  Refill history confirmed via [place] Controlled Substance database, accessed by me today..REfills for 3 months given .  Continue tizanidine for prn evning use given presence of muscle spasm involving the entire right paraspinus muscle region

## 2017-09-28 ENCOUNTER — Encounter: Payer: Self-pay | Admitting: Family Medicine

## 2017-09-28 MED ORDER — PANTOPRAZOLE SODIUM 40 MG PO TBEC: 40.0000 mg | DELAYED_RELEASE_TABLET | Freq: Every day | ORAL | 3 refills | Status: DC

## 2017-10-04 MED ORDER — ZOLPIDEM TARTRATE 10 MG PO TABS: 10.0000 mg | ORAL_TABLET | Freq: Every evening | ORAL | 5 refills | Status: DC | PRN

## 2017-10-04 NOTE — Telephone Encounter (Signed)
Refilled: 04/09/2017 Last OV: 09/24/2017 Next OV: 12/26/2017

## 2017-10-08 ENCOUNTER — Other Ambulatory Visit: Payer: Self-pay | Admitting: Internal Medicine

## 2017-10-08 MED ORDER — SUCRALFATE 1 G PO TABS: 1.0000 g | ORAL_TABLET | Freq: Three times a day (TID) | ORAL | 2 refills | Status: DC

## 2017-10-13 ENCOUNTER — Other Ambulatory Visit: Payer: Self-pay | Admitting: Internal Medicine

## 2017-10-24 NOTE — Progress Notes (Signed)
[name] [address], [place] [address] Phone: [phone] Subjective:    I'm seeing this patient by the request  of:    I Kana Grandville Silos am serving as a scribe for Dr. Hulan Saas.   CC: Back pain follow-up  [id number]  Adrienne Robinson is a 62 y.o. female coming in with complaint of back pain. Right sided back spasm. Upper traps. are painful.   Noted to have arthritic changes of multiple joints.  Patient has minimal degenerative disc disease.  Patient has been doing relatively well overall.  Patient states that recently has had increasing back pain.  Feels like a tightness.  It is unrelenting.  Has been moving to a new apartment.     Past Medical History:  Diagnosis Date  . Allergy   . Arthritis   . 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 B12 deficiency 01/2016  . Candida esophagitis ([place])   . Degenerative joint disease involving multiple joints   . Depression   . Dizziness   . Hypertension   . Pneumonia    approx 6 years ago  . Scoliosis   . Spinal stenosis of lumbar region   . Tubular adenoma of colon    Past Surgical History:  Procedure Laterality Date  . ABDOMINAL HYSTERECTOMY    . APPENDECTOMY    . CARDIAC CATHETERIZATION     normal coronaries, no wall motion abnormalities 11/02/09 [facility])  . NASAL SINUS SURGERY    . OOPHORECTOMY    . RADIOLOGY WITH ANESTHESIA  02/26/2012   Procedure: RADIOLOGY WITH ANESTHESIA;  Surgeon: [name], MD;  Location: [place];  Service: Radiology;  Laterality: N/A;  . REVERSE SHOULDER ARTHROPLASTY Right 06/27/2013   Procedure: REVERSE RIGHT TOTAL SHOULDER ARTHROPLASTY;  Surgeon: [name], MD;  Location: [place];  Service: Orthopedics;  Laterality: Right;  . SHOULDER ARTHROSCOPY WITH ROTATOR CUFF REPAIR AND SUBACROMIAL DECOMPRESSION  2007   left  . TONSILLECTOMY  adnoids   Social History   Socioeconomic History  . Marital  status: Married    Spouse name: Not on file  . Number of children: 0  . Years of education: 8  . Highest education level: Not on file  Occupational History  . Occupation: Warden/ranger   Social Needs  . Financial resource strain: Not on file  . Food insecurity:    Worry: Not on file    Inability: Not on file  . Transportation needs:    Medical: Not on file    Non-medical: Not on file  Tobacco Use  . Smoking status: Never Smoker  . Smokeless tobacco: Never Used  Substance and Sexual Activity  . Alcohol use: Yes    Alcohol/week: 2.0 standard drinks    Types: 2 Glasses of wine per week    Comment: 2-3 week with dinner  . Drug use: No  . Sexual activity: Not on file  Lifestyle  . Physical activity:    Days per week: Not on file    Minutes per session: Not on file  . Stress: Not on file  Relationships  . Social connections:    Talks on phone: Not on file    Gets together: Not on file    Attends religious service: Not on file    Active member of club or organization: Not on file    Attends meetings of clubs or organizations: Not on file  Relationship status: Not on file  Other Topics Concern  . Not on file  Social History Narrative   Lives at home with her husband.   Right-handed.   2-3 diet cokes per day.   Allergies  Allergen Reactions  . Morphine And Related Rash  . Adhesive [Tape] Rash and Other (See Comments)    Redness, blisters, skin peeling off.   Family History  Problem Relation Age of Onset  . Colon cancer Father 23  . Diabetes Father   . Heart disease Father   . Rectal cancer Father   . Thyroid disease Mother   . Colon cancer Paternal Grandfather   . Colon cancer Other        pggm  . Breast cancer Paternal Grandmother   . Stomach cancer Neg Hx       Current Outpatient Medications (Cardiovascular):  .  amLODipine (NORVASC) 10 MG tablet, TAKE 1 TABLET (10 MG TOTAL) BY MOUTH DAILY.   Current Outpatient Medications (Respiratory):  .   fluticasone (FLONASE) 50 MCG/ACT nasal spray, USE 2 SPRAYS IN EACH NOSTRIL DAILY AS NEEDED   Current Outpatient Medications (Analgesics):  .  aspirin EC 81 MG tablet, Take 81 mg by mouth daily. .  celecoxib (CELEBREX) 200 MG capsule, TAKE 1 CAPSULE BY MOUTH EVERY DAY .  HYDROcodone-acetaminophen (NORCO) 7.5-325 MG tablet, Take 1 tablet by mouth every 4 (four) hours as needed for moderate pain. May refill on or after December 19, 2017   Current Outpatient Medications (Hematological):  .  cyanocobalamin (,VITAMIN B-12,) 1000 MCG/ML injection, Inject 1 mL (1,000 mcg total) into the muscle once a week.   Current Outpatient Medications (Other):  Marland Kitchen  buPROPion (WELLBUTRIN XL) 300 MG 24 hr tablet, TAKE 1 TABLET BY MOUTH EVERY DAY .  hydrOXYzine (ATARAX/VISTARIL) 25 MG tablet, TAKE 1 TABLET (25 MG TOTAL) BY MOUTH EVERY 8 (EIGHT) HOURS AS NEEDED. FOR ANXIETY .  omeprazole (PRILOSEC) 40 MG capsule, Take 1 capsule (40 mg total) by mouth daily. .  pantoprazole (PROTONIX) 40 MG tablet, Take 1 tablet (40 mg total) by mouth daily. .  Probiotic Product (PROBIOTIC PO), Take 1 capsule by mouth daily. .  Syringe/Needle, Disp, (SYRINGE 3CC/25GX1") 25G X 1" 3 ML MISC, Use for b12 injections .  zolpidem (AMBIEN) 10 MG tablet, Take 1 tablet (10 mg total) by mouth at bedtime as needed. for sleep .  tiZANidine (ZANAFLEX) 4 MG tablet, Take 1 tablet (4 mg total) by mouth every 6 (six) hours as needed for muscle spasms.  Current Facility-Administered Medications (Other):  .  0.9 %  sodium chloride infusion    Past medical history, social, surgical and family history all reviewed in electronic medical record.  No pertanent information unless stated regarding to the chief complaint.   Review of Systems:  No headache, visual changes, nausea, vomiting, diarrhea, constipation, dizziness, abdominal pain, skin rash, fevers, chills, night sweats, weight loss, swollen lymph nodes, body aches, joint swelling,  chest pain,  shortness of breath, mood changes.  Positive muscle aches  Objective  Blood pressure 120/74, pulse 64, height 5\' 6"  (1.676 m), weight 115 lb (52.2 kg), SpO2 98 %.    General: No apparent distress alert and oriented x3 mood and affect normal, dressed appropriately.  HEENT: Pupils equal, extraocular movements intact  Respiratory: Patient's speak in full sentences and does not appear short of breath  Cardiovascular: No lower extremity edema, non tender, no erythema  Skin: Warm dry intact with no signs of infection or  rash on extremities or on axial skeleton.  Abdomen: Soft nontender  Neuro: Cranial nerves II through XII are intact, neurovascularly intact in all extremities with 2+ DTRs and 2+ pulses.  Lymph: No lymphadenopathy of posterior or anterior cervical chain or axillae bilaterally.  Gait normal with good balance and coordination.  MSK:  Non tender with full range of motion and good stability and symmetric strength and tone of shoulders, elbows, wrist, hip, knee and ankles bilaterally.  Arthritic changes of multiple joints.  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  Patient does have some mild hypermobility.  Patient does have significant tightness of the musculature from the thoracic down to the lower lumbar spine.  Patient does have a muscle spasm in the lumbar spine on the right side.  Osteopathic findings T9 extended rotated and side bent left L3 flexed rotated and side bent right Sacrum right on right      Impression and Recommendations:     This case required medical decision making of moderate complexity. The above documentation has been reviewed and is accurate and complete Lyndal Pulley, DO       Note: This dictation was prepared with Dragon dictation along with smaller phrase technology. Any transcriptional errors that result from this process are unintentional.

## 2017-10-25 ENCOUNTER — Ambulatory Visit (INDEPENDENT_AMBULATORY_CARE_PROVIDER_SITE_OTHER): Payer: BLUE CROSS/BLUE SHIELD | Admitting: Family Medicine

## 2017-10-25 ENCOUNTER — Encounter: Payer: Self-pay | Admitting: Family Medicine

## 2017-10-25 VITALS — BP 120/74 | HR 64 | Ht 66.0 in | Wt 115.0 lb

## 2017-10-25 DIAGNOSIS — M999 Biomechanical lesion, unspecified: Secondary | ICD-10-CM

## 2017-10-25 DIAGNOSIS — M48062 Spinal stenosis, lumbar region with neurogenic claudication: Secondary | ICD-10-CM | POA: Diagnosis not present

## 2017-10-25 MED ORDER — KETOROLAC TROMETHAMINE 60 MG/2ML IM SOLN
60.0000 mg | Freq: Once | INTRAMUSCULAR | Status: AC
  Administered 2017-10-25: 60 mg via INTRAMUSCULAR

## 2017-10-25 MED ORDER — TIZANIDINE HCL 4 MG PO TABS: 4.0000 mg | ORAL_TABLET | Freq: Four times a day (QID) | ORAL | 0 refills | Status: DC | PRN

## 2017-10-25 MED ORDER — METHYLPREDNISOLONE ACETATE 80 MG/ML IJ SUSP
80.0000 mg | Freq: Once | INTRAMUSCULAR | Status: AC
  Administered 2017-10-25: 80 mg via INTRAMUSCULAR

## 2017-10-25 NOTE — Assessment & Plan Note (Signed)
History is significant spinal stenosis and lumbar region.  Discussed with patient and patient did have more tightness of the muscles.  Toradol and Depo-Medrol given today.-Regimen.  Discussed topical anti-inflammatories.  Follow-up again with me in 3weeks

## 2017-10-25 NOTE — Patient Instructions (Signed)
Good to see yo u Adrienne Robinson is your friend.  Zanaflex up to 3 times a day but will make you a little sleepy  2 Injections today  Good luck with the move See me again in 3 weeks

## 2017-10-25 NOTE — Assessment & Plan Note (Signed)
Decision today to treat with OMT was based on Physical Exam  After verbal consent patient was treated with HVLA, ME, FPR techniques in  thoracic, lumbar and sacral areas  Patient tolerated the procedure well with improvement in symptoms  Patient given exercises, stretches and lifestyle modifications  See medications in patient instructions if given  Patient will follow up in 3 weeks 

## 2017-11-07 ENCOUNTER — Ambulatory Visit: Payer: BLUE CROSS/BLUE SHIELD | Admitting: Family Medicine

## 2017-11-14 ENCOUNTER — Other Ambulatory Visit: Payer: Self-pay | Admitting: Internal Medicine

## 2017-12-11 MED FILL — CELECOXIB 200 MG CAP: 200 | 30 days supply | Qty: 30 | Fill #0

## 2017-12-12 MED FILL — ZOLPIDEM TARTRATE 10 MG TAB: 10 | 30 days supply | Qty: 30 | Fill #0

## 2017-12-13 MED FILL — FLUTICASONE PROP 50 MCG SPR: 50 | 30 days supply | Qty: 16 | Fill #0

## 2017-12-18 MED FILL — CYANOCOBALAMIN 1,000 MCG/ML: 1000 | 84 days supply | Qty: 12 | Fill #0

## 2017-12-18 MED FILL — OMEPRAZOLE 40 MG CPDR: 40 | 90 days supply | Qty: 90 | Fill #0

## 2017-12-18 MED FILL — AMLODIPINE BESYLATE 10 MG T: 10 | 90 days supply | Qty: 90 | Fill #0

## 2017-12-18 MED FILL — PANTOPRAZOLE SOD DR 40 MG T: 40 | 90 days supply | Qty: 180 | Fill #0

## 2017-12-19 MED FILL — HYDROCODON-APAP 7.5-325: 7.5-325 | 30 days supply | Qty: 180 | Fill #0

## 2017-12-20 MED FILL — LIDOCAINE 2% VISCOUS SOLN: 2 | 5 days supply | Qty: 20 | Fill #0

## 2017-12-26 ENCOUNTER — Ambulatory Visit: Payer: 59 | Admitting: Internal Medicine

## 2017-12-26 ENCOUNTER — Encounter: Payer: Self-pay | Admitting: Internal Medicine

## 2017-12-26 DIAGNOSIS — I10 Essential (primary) hypertension: Secondary | ICD-10-CM

## 2017-12-26 DIAGNOSIS — M48062 Spinal stenosis, lumbar region with neurogenic claudication: Secondary | ICD-10-CM

## 2017-12-26 DIAGNOSIS — M130 Polyarthritis, unspecified: Secondary | ICD-10-CM | POA: Diagnosis not present

## 2017-12-26 DIAGNOSIS — M159 Polyosteoarthritis, unspecified: Secondary | ICD-10-CM

## 2017-12-26 DIAGNOSIS — F321 Major depressive disorder, single episode, moderate: Secondary | ICD-10-CM | POA: Diagnosis not present

## 2017-12-26 MED ORDER — ESCITALOPRAM OXALATE 10 MG PO TABS: 10.0000 mg | ORAL_TABLET | Freq: Every day | ORAL | 2 refills | Status: DC

## 2017-12-26 MED ORDER — HYDROCODONE-ACETAMINOPHEN 7.5-325 MG PO TABS: 1.0000 | ORAL_TABLET | ORAL | 0 refills | Status: DC | PRN

## 2017-12-26 MED FILL — ESCITALOPRAM 10 MG TABLET: 10 | 30 days supply | Qty: 30 | Fill #0

## 2017-12-26 NOTE — Progress Notes (Signed)
Subjective:  Patient ID: Adrienne Robinson, female    DOB: 12/21/1955  Age: 62 y.o. MRN: [id number]  CC: Diagnoses of Osteoarthritis of multiple joints, unspecified osteoarthritis type, Spinal stenosis of lumbar region with neurogenic claudication, Essential hypertension, Major depressive disorder, single episode, moderate ([place]), and Polyarthritis of multiple sites were pertinent to this visit.  HPI Adrienne Robinson presents for MEDICATION REFILL  Chronic shoulder and back pain seconday to DJD with spinal stenosis of lumbar region managed with stable regimen that includes short acting opiates.   She denies use  of alcohol. .she was treated by sports medicine in  September with  depo medrol and toradol  Refill history confirmed via [place] Controlled Substance database, accessed by me today..  Due to husband's declining health status resulting in disability  She has returned to nursing  And has been working  [employer] at the  [employer] . Physically  More active,  Not hurting as much.  Scrubbing in as OR nurse. Sold house  and moved into an [place]. [name] is s/p BKA , has a prosthesis  and now has a new pressure ulcer .  On antibiotics and offloading   Major life change s  Needs refills sent to [facility]    Outpatient Medications Prior to Visit  Medication Sig Dispense Refill  . amLODipine (NORVASC) 10 MG tablet TAKE 1 TABLET (10 MG TOTAL) BY MOUTH DAILY. 90 tablet 3  . aspirin EC 81 MG tablet Take 81 mg by mouth daily.    Marland Kitchen buPROPion (WELLBUTRIN XL) 300 MG 24 hr tablet TAKE 1 TABLET BY MOUTH EVERY DAY 90 tablet 1  . celecoxib (CELEBREX) 200 MG capsule TAKE 1 CAPSULE BY MOUTH EVERY DAY 30 capsule 5  . cyanocobalamin (,VITAMIN B-12,) 1000 MCG/ML injection Inject 1 mL (1,000 mcg total) into the muscle once a week. 10 mL 11  . fluticasone (FLONASE) 50 MCG/ACT nasal spray USE 2 SPRAYS IN EACH NOSTRIL DAILY AS NEEDED 16 g 1  . omeprazole (PRILOSEC) 40 MG capsule Take 1 capsule (40 mg  total) by mouth daily. 30 capsule 3  . pantoprazole (PROTONIX) 40 MG tablet Take 1 tablet (40 mg total) by mouth daily. 90 tablet 3  . Syringe/Needle, Disp, (SYRINGE 3CC/25GX1") 25G X 1" 3 ML MISC Use for b12 injections 50 each 0  . zolpidem (AMBIEN) 10 MG tablet Take 1 tablet (10 mg total) by mouth at bedtime as needed. for sleep 30 tablet 5  . HYDROcodone-acetaminophen (NORCO) 7.5-325 MG tablet Take 1 tablet by mouth every 4 (four) hours as needed for moderate pain. May refill on or after December 19, 2017 180 tablet 0  . hydrOXYzine (ATARAX/VISTARIL) 25 MG tablet TAKE 1 TABLET (25 MG TOTAL) BY MOUTH EVERY 8 (EIGHT) HOURS AS NEEDED. FOR ANXIETY 270 tablet 1  . Probiotic Product (PROBIOTIC PO) Take 1 capsule by mouth daily.    Marland Kitchen tiZANidine (ZANAFLEX) 4 MG tablet Take 1 tablet (4 mg total) by mouth every 6 (six) hours as needed for muscle spasms. 30 tablet 0   Facility-Administered Medications Prior to Visit  Medication Dose Route Frequency Provider Last Rate Last Dose  . 0.9 %  sodium chloride infusion  500 mL Intravenous Continuous Pyrtle, Lajuan Lines, MD        Review of Systems;  Patient denies headache, fevers, malaise, unintentional weight loss, skin rash, eye pain, sinus congestion and sinus pain, sore throat, dysphagia,  hemoptysis , cough, dyspnea, wheezing,  chest pain, palpitations, orthopnea, edema, abdominal pain, nausea, melena, diarrhea, constipation, flank pain, dysuria, hematuria, urinary  Frequency, nocturia, numbness, tingling, seizures,  Focal weakness, Loss of consciousness,  Tremor, insomnia, depression, anxiety, and suicidal ideation.      Objective:  BP 140/78   Pulse 75   Temp 97.6 F (36.4 C) (Oral)   Ht 5\' 6"  (1.676 m)   Wt 120 lb 12.8 oz (54.8 kg)   SpO2 95%   BMI 19.50 kg/m   BP Readings from Last 3 Encounters:  12/26/17 140/78  10/25/17 120/74  09/25/17 138/70    Wt Readings from Last 3 Encounters:  12/26/17 120 lb 12.8 oz (54.8 kg)  10/25/17 115 lb  (52.2 kg)  09/25/17 118 lb (53.5 kg)    General appearance: alert, cooperative and appears stated age Ears: normal TM's and external ear canals both ears Throat: lips, mucosa, and tongue normal; teeth and gums normal Neck: no adenopathy, no carotid bruit, supple, symmetrical, trachea midline and thyroid not enlarged, symmetric, no tenderness/mass/nodules Back: symmetric, no curvature. ROM normal. No CVA tenderness. Lungs: clear to auscultation bilaterally Heart: regular rate and rhythm, S1, S2 normal, no murmur, click, rub or gallop Abdomen: soft, non-tender; bowel sounds normal; no masses,  no organomegaly Pulses: 2+ and symmetric Skin: Skin color, texture, turgor normal. No rashes or lesions Lymph nodes: Cervical, supraclavicular, and axillary nodes normal.  Lab Results  Component Value Date   HGBA1C 5.7 08/04/2011    Lab Results  Component Value Date   CREATININE 1.07 09/24/2017   CREATININE 1.21 (H) 04/09/2017   CREATININE 1.17 10/16/2016    Lab Results  Component Value Date   WBC 4.8 03/04/2015   HGB 10.9 (L) 03/04/2015   HCT 32.1 (L) 03/04/2015   PLT 291 03/04/2015   GLUCOSE 94 09/24/2017   CHOL 245 (H) 10/16/2016   TRIG 104.0 10/16/2016   HDL 68.60 10/16/2016   LDLDIRECT 160.2 04/11/2013   LDLCALC 156 (H) 10/16/2016   ALT 13 09/24/2017   AST 16 09/24/2017   NA 132 (L) 09/24/2017   K 4.2 09/24/2017   CL 98 09/24/2017   CREATININE 1.07 09/24/2017   BUN 28 (H) 09/24/2017   CO2 26 09/24/2017   TSH 0.72 08/06/2014   INR 0.94 03/04/2015   HGBA1C 5.7 08/04/2011   MICROALBUR <0.7 05/09/2016    [place] Limited Joint Space Structures Up Left  Result Date: 09/27/2017 Procedure: Real-time Ultrasound Guided Injection of left CMC joint Device: GE Logiq Q7 Ultrasound guided injection is preferred based studies that show increased duration, increased effect, greater accuracy, decreased procedural pain, increased response rate, and decreased cost with ultrasound guided versus  blind injection. Verbal informed consent obtained. Time-out conducted. Noted no overlying erythema, induration, or other signs of local infection. Skin prepped in a sterile fashion. Local anesthesia: Topical Ethyl chloride. With sterile technique and under real time ultrasound guidance: With a 25-gauge epidural needle was injected with 0.5 cc of 0.5% Marcaine and 0.5 cc of Kenalog 40 mg/mL Completed without difficulty Pain immediately resolved suggesting accurate placement of the medication. Advised to call if fevers/chills, erythema, induration, drainage, or persistent bleeding. Images permanently stored and available for review in the ultrasound unit. Impression: Technically successful ultrasound guided injection   Dg Hand Complete Left  Result Date: 09/26/2017 CLINICAL DATA:  Chronic thumb pain. EXAM: LEFT HAND - COMPLETE 3+ VIEW COMPARISON:  07/22/2008 FINDINGS: First CMC osteoarthritis with spurring, subluxation, and subchondral cyst formation in the metacarpal base. This is significantly progressed from  prior. No erosive changes. Generalized osteopenic appearance IMPRESSION: Severe first CMC osteoarthritis that has progressed from 2010. Electronically Signed   By: Monte Fantasia M.D.   On: 09/26/2017 08:54    Assessment & Plan:   Problem List Items Addressed This Visit    Degenerative joint disease involving multiple joints    Left thumb joint pain and swelling improved s/p steroid injection by Dr Tamala Julian.  Discussed referral to hand surgeon at the appropriate time .  Continue current medication regimen . Refill history confirmed via [place] Controlled Substance databas, accessed by me today.. refills on vicodin given for 3 months       Relevant Medications   HYDROcodone-acetaminophen (NORCO) 7.5-325 MG tablet (Start on 01/18/2018)   Hypertension    Improved control with amlodipine 10 mg daily .  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  Periodic steroid injections in affected joints,  By sports medicine       Spinal stenosis of lumbar region    She has chronic back pain Complicated by scoliosis. She is unable to sit for prolonged periods of time without increased pain.  She uses a standing desk when at home. She continues to require 6 vicodin daily to manage her pain along with celebrex and tizanidine/   Refill history confirmed via [place] Controlled Substance database, accessed by me today..REfills for 3 months given .  Continue tizanidine for prn evning use given presence of muscle spasm involving the entire right paraspinus muscle region        A total of 25 minutes of face to face time was spent with patient more than half of which was spent in counselling about the above mentioned conditions  and coordination of care   I have discontinued Kenetha D. Fager's Probiotic Product (PROBIOTIC PO), tiZANidine, and hydrOXYzine. I have also changed her HYDROcodone-acetaminophen. Additionally, I am having her start on escitalopram. Lastly, I am having her maintain her aspirin EC, fluticasone, amLODipine, buPROPion, celecoxib, omeprazole, cyanocobalamin, SYRINGE 3CC/25GX1", pantoprazole, and zolpidem. We will continue to administer sodium chloride.  Meds ordered this encounter  Medications  . escitalopram (LEXAPRO) 10 MG tablet    Sig: Take 1 tablet (10 mg total) by mouth daily.    Dispense:  30 tablet    Refill:  2  . HYDROcodone-acetaminophen (NORCO) 7.5-325 MG tablet    Sig: Take 1 tablet by mouth every 4 (four) hours as needed for moderate pain. May refill on or after Jan 18, 2018    Dispense:  180 tablet    Refill:  0    Medications Discontinued  During This Encounter  Medication Reason  . hydrOXYzine (ATARAX/VISTARIL) 25 MG tablet Patient Preference  . Probiotic Product (PROBIOTIC PO) Patient Preference  . tiZANidine (ZANAFLEX) 4 MG tablet Patient Preference  . HYDROcodone-acetaminophen (NORCO) 7.5-325 MG tablet Reorder    Follow-up: No follow-ups on file.   [name], MD

## 2017-12-26 NOTE — Patient Instructions (Addendum)
Please start the Lexapro (escitalopram) at 1/2 tablet daily in the evening for the first few days to avoid nausea.  You can increase to a full tablet after 4 days if you have  not developed side effects of nausea.  If the lexapro interferes with your sleep, take it in the morning instead  Please return in  4 weeks ,  Or e mail me to let me know how it is helping your depression  I will refill your vicodin and ambien  Electronically , , you do not have to keep up with the prescriptions   Try taking Lactase before any meal with dairy  For the gas

## 2017-12-29 NOTE — Assessment & Plan Note (Addendum)
Left thumb joint pain and swelling improved s/p steroid injection by Dr Tamala Julian.  Discussed referral to hand surgeon at the appropriate time .  Continue current medication regimen . Refill history confirmed via [place] Controlled Substance databas, accessed by me today.. refills on vicodin given for 3 months

## 2017-12-29 NOTE — Assessment & Plan Note (Addendum)
She has chronic back pain Complicated by scoliosis. She is unable to sit for prolonged periods of time without increased pain.  She uses a standing desk when at home. She continues to require 6 vicodin daily to manage her pain along with celebrex and tizanidine/   Refill history confirmed via [place] Controlled Substance database, accessed by me today..REfills for 3 months given .  Continue tizanidine for prn evning use given presence of muscle spasm involving the entire right paraspinus muscle region

## 2017-12-29 NOTE — Assessment & Plan Note (Signed)
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

## 2017-12-29 NOTE — Assessment & Plan Note (Signed)
Improved control with amlodipine 10 mg daily .  No changes today as home readings have been <  140/80   

## 2017-12-29 NOTE — Assessment & Plan Note (Signed)
Continue celebrex and vicodin.  Periodic steroid injections in affected joints,  By sports medicine

## 2018-01-02 ENCOUNTER — Other Ambulatory Visit: Payer: Self-pay | Admitting: Internal Medicine

## 2018-01-02 MED ORDER — AMOXICILLIN-POT CLAVULANATE 875-125 MG PO TABS: 1.0000 | ORAL_TABLET | Freq: Two times a day (BID) | ORAL | 0 refills | Status: DC

## 2018-01-02 MED ORDER — PREDNISONE 10 MG PO TABS: ORAL_TABLET | ORAL | 0 refills | Status: DC

## 2018-01-02 MED FILL — predniSONE 10 MG TABS: 10 | 8 days supply | Qty: 33 | Fill #0

## 2018-01-02 MED FILL — AMOX-CLAV 875-125 MG TABLET: 875-125 | 7 days supply | Qty: 14 | Fill #0

## 2018-01-02 NOTE — Progress Notes (Signed)
pre

## 2018-01-09 MED FILL — ZOLPIDEM TARTRATE 10 MG TAB: 10 | 30 days supply | Qty: 30 | Fill #1

## 2018-01-11 MED FILL — CELECOXIB 200 MG CAP: 200 | 30 days supply | Qty: 30 | Fill #1

## 2018-01-16 ENCOUNTER — Other Ambulatory Visit: Payer: Self-pay | Admitting: Internal Medicine

## 2018-01-16 MED ORDER — PREDNISONE 10 MG PO TABS: ORAL_TABLET | ORAL | 0 refills | Status: DC

## 2018-01-18 MED FILL — HYDROCODON-APAP 7.5-325: 7.5-325 | 30 days supply | Qty: 180 | Fill #0

## 2018-01-18 MED FILL — predniSONE 10 MG TABS: 10 | 8 days supply | Qty: 33 | Fill #0

## 2018-01-22 ENCOUNTER — Ambulatory Visit: Payer: Self-pay

## 2018-01-22 ENCOUNTER — Other Ambulatory Visit: Payer: Self-pay | Admitting: Family Medicine

## 2018-01-22 DIAGNOSIS — M25521 Pain in right elbow: Secondary | ICD-10-CM

## 2018-01-25 ENCOUNTER — Ambulatory Visit: Payer: 59 | Admitting: Internal Medicine

## 2018-01-25 ENCOUNTER — Encounter: Payer: Self-pay | Admitting: Internal Medicine

## 2018-01-25 ENCOUNTER — Ambulatory Visit (INDEPENDENT_AMBULATORY_CARE_PROVIDER_SITE_OTHER): Payer: 59

## 2018-01-25 VITALS — BP 140/76 | HR 83 | Temp 98.0°F | Resp 15 | Ht 66.0 in | Wt 121.8 lb

## 2018-01-25 DIAGNOSIS — F418 Other specified anxiety disorders: Secondary | ICD-10-CM

## 2018-01-25 DIAGNOSIS — Z23 Encounter for immunization: Secondary | ICD-10-CM | POA: Diagnosis not present

## 2018-01-25 DIAGNOSIS — R61 Generalized hyperhidrosis: Secondary | ICD-10-CM

## 2018-01-25 MED ORDER — ALPRAZOLAM 0.5 MG PO TABS: 0.2500 mg | ORAL_TABLET | Freq: Every day | ORAL | 2 refills | Status: DC | PRN

## 2018-01-25 MED FILL — ALPRAZolam 0.5 MG TABS: 0.5 | 30 days supply | Qty: 15 | Fill #0

## 2018-01-25 NOTE — Progress Notes (Signed)
Subjective:  Patient ID: Adrienne Robinson, female    DOB: [birth year]  Age: 62 y.o. MRN: [id number]  CC: The primary encounter diagnosis was Night sweats. Diagnoses of Need for immunization against influenza and Anxiety associated with depression were also pertinent to this visit.  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  She stopped lexapro after 4 days due to fatigue.  Does not want to try anything else, because she feels they will alter her personality. . Prefers to use small dose of alprazolam, which she is not using on a  Daily.  Left ear still feels somewhat full after empiric antibiotic therapy and two rounds of prednisone   New onset night sweats for the last 2 weeks.  None during the day .  No weight loss,  Cough,  No known TB exposure ,  Feels fine energy wise.  Menopause was over 15 years ago,  No symptoms       Outpatient Medications Prior to Visit  Medication Sig Dispense Refill  . amLODipine (NORVASC) 10 MG tablet TAKE 1 TABLET (10 MG TOTAL) BY MOUTH DAILY. 90 tablet 3  . aspirin EC 81 MG tablet Take 81 mg by mouth daily.    Marland Kitchen buPROPion (WELLBUTRIN XL) 300 MG 24 hr tablet TAKE 1 TABLET BY MOUTH EVERY DAY 90 tablet 1  . celecoxib (CELEBREX) 200 MG capsule TAKE 1 CAPSULE BY MOUTH EVERY DAY 30 capsule 5  . cyanocobalamin (,VITAMIN B-12,) 1000 MCG/ML injection Inject 1 mL (1,000 mcg total) into the muscle once a week. 10 mL 11  . fluticasone (FLONASE) 50 MCG/ACT nasal spray USE 2 SPRAYS IN EACH NOSTRIL DAILY AS NEEDED 16 g 1  . HYDROcodone-acetaminophen (NORCO) 7.5-325 MG tablet Take 1 tablet by mouth every 4 (four) hours as needed for moderate pain. May refill on or after Jan 18, 2018 180 tablet 0  . lidocaine (XYLOCAINE) 2 % solution   0  . omeprazole (PRILOSEC) 40 MG capsule Take 1 capsule (40 mg  total) by mouth daily. 30 capsule 3  . pantoprazole (PROTONIX) 40 MG tablet Take 1 tablet (40 mg total) by mouth daily. 90 tablet 3  . Syringe/Needle, Disp, (SYRINGE 3CC/25GX1") 25G X 1" 3 ML MISC Use for b12 injections 50 each 0  . zolpidem (AMBIEN) 10 MG tablet Take 1 tablet (10 mg total) by mouth at bedtime as needed. for sleep 30 tablet 5  . amoxicillin-clavulanate (AUGMENTIN) 875-125 MG tablet Take 1 tablet by mouth 2 (two) times daily. (Patient not taking: Reported on 01/25/2018) 14 tablet 0  . escitalopram (LEXAPRO) 10 MG tablet Take 1 tablet (10 mg total) by mouth daily. (Patient not taking: Reported on 01/25/2018) 30 tablet 2  . predniSONE (DELTASONE) 10 MG tablet 6 tablets daily for 3 days,  then reduce by 1 tablet daily until gone (Patient not taking: Reported on 01/25/2018) 33 tablet 0   Facility-Administered Medications Prior to Visit  Medication Dose Route Frequency Provider Last Rate Last Dose  . 0.9 %  sodium chloride infusion  500 mL Intravenous Continuous Pyrtle, Lajuan Lines, MD        Review of Systems;  Patient denies headache, fevers, malaise, unintentional weight loss, skin rash, eye pain, sinus congestion and sinus pain, sore throat, dysphagia,  hemoptysis , cough, dyspnea,  wheezing, chest pain, palpitations, orthopnea, edema, abdominal pain, nausea, melena, diarrhea, constipation, flank pain, dysuria, hematuria, urinary  Frequency, nocturia, numbness, tingling, seizures,  Focal weakness, Loss of consciousness,  Tremor, insomnia, depression, anxiety, and suicidal ideation.      Objective:  BP 140/76 (BP Location: Left Arm, Patient Position: Sitting, Cuff Size: Normal)   Pulse 83   Temp 98 F (36.7 C) (Oral)   Resp 15   Ht 5\' 6"  (1.676 m)   Wt 121 lb 12.8 oz (55.2 kg)   SpO2 96%   BMI 19.66 kg/m   BP Readings from Last 3 Encounters:  01/25/18 140/76  12/26/17 140/78  10/25/17 120/74    Wt Readings from Last 3 Encounters:  01/25/18 121 lb 12.8 oz (55.2 kg)  12/26/17  120 lb 12.8 oz (54.8 kg)  10/25/17 115 lb (52.2 kg)    General appearance: alert, cooperative and appears stated age Ears: normal TM's and external ear canals both ears Throat: lips, mucosa, and tongue normal; teeth and gums normal Neck: no adenopathy, no carotid bruit, supple, symmetrical, trachea midline and thyroid not enlarged, symmetric, no tenderness/mass/nodules Back: symmetric, no curvature. ROM normal. No CVA tenderness. Lungs: clear to auscultation bilaterally Heart: regular rate and rhythm, S1, S2 normal, no murmur, click, rub or gallop Abdomen: soft, non-tender; bowel sounds normal; no masses,  no organomegaly Pulses: 2+ and symmetric Skin: Skin color, texture, turgor normal. No rashes or lesions Lymph nodes: Cervical, supraclavicular, and axillary nodes normal.  Lab Results  Component Value Date   HGBA1C 5.7 08/04/2011    Lab Results  Component Value Date   CREATININE 1.07 09/24/2017   CREATININE 1.21 (H) 04/09/2017   CREATININE 1.17 10/16/2016    Lab Results  Component Value Date   WBC 9.3 01/25/2018   HGB 10.2 (L) 01/25/2018   HCT 29.1 (L) 01/25/2018   PLT 378 01/25/2018   GLUCOSE 94 09/24/2017   CHOL 245 (H) 10/16/2016   TRIG 104.0 10/16/2016   HDL 68.60 10/16/2016   LDLDIRECT 160.2 04/11/2013   LDLCALC 156 (H) 10/16/2016   ALT 13 09/24/2017   AST 16 09/24/2017   NA 132 (L) 09/24/2017   K 4.2 09/24/2017   CL 98 09/24/2017   CREATININE 1.07 09/24/2017   BUN 28 (H) 09/24/2017   CO2 26 09/24/2017   TSH 0.24 (L) 01/25/2018   INR 0.94 03/04/2015   HGBA1C 5.7 08/04/2011   MICROALBUR <0.7 05/09/2016    [place] Limited Joint Space Structures Up Left  Result Date: 09/27/2017 Procedure: Real-time Ultrasound Guided Injection of left CMC joint Device: GE Logiq Q7 Ultrasound guided injection is preferred based studies that show increased duration, increased effect, greater accuracy, decreased procedural pain, increased response rate, and decreased cost with  ultrasound guided versus blind injection. Verbal informed consent obtained. Time-out conducted. Noted no overlying erythema, induration, or other signs of local infection. Skin prepped in a sterile fashion. Local anesthesia: Topical Ethyl chloride. With sterile technique and under real time ultrasound guidance: With a 25-gauge epidural needle was injected with 0.5 cc of 0.5% Marcaine and 0.5 cc of Kenalog 40 mg/mL Completed without difficulty Pain immediately resolved suggesting accurate placement of the medication. Advised to call if fevers/chills, erythema, induration, drainage, or persistent bleeding. Images permanently stored and available for review in the ultrasound unit. Impression: Technically successful ultrasound guided injection   Dg Hand Complete Left  Result Date: 09/26/2017 CLINICAL DATA:  Chronic thumb pain. EXAM: LEFT HAND - COMPLETE 3+ VIEW COMPARISON:  07/22/2008 FINDINGS: First  CMC osteoarthritis with spurring, subluxation, and subchondral cyst formation in the metacarpal base. This is significantly progressed from prior. No erosive changes. Generalized osteopenic appearance IMPRESSION: Severe first CMC osteoarthritis that has progressed from 2010. Electronically Signed   By: Monte Fantasia M.D.   On: 09/26/2017 08:54    Assessment & Plan:   Problem List Items Addressed This Visit    Anxiety associated with depression    She did not tolerate lexapro due to persistent fatigue after 4 days  of starting  At the low 5 mg dose...  Continue wellbutrin       Relevant Medications   ALPRAZolam (XANAX) 0.5 MG tablet    Other Visit Diagnoses    Night sweats    -  Primary   Relevant Orders   DG Chest 2 View   CBC with Differential/Platelet (Completed)   TSH (Completed)   Need for immunization against influenza       Relevant Orders   Flu Vaccine QUAD 36+ mos IM (Completed)    A total of 25 minutes of face to face time was spent with patient more than half of which was spent in  counselling about the above mentioned conditions  and coordination of care   I have discontinued Alyria D. Scarber's escitalopram, amoxicillin-clavulanate, and predniSONE. I am also having her start on ALPRAZolam. Additionally, I am having her maintain her aspirin EC, fluticasone, amLODipine, buPROPion, celecoxib, omeprazole, cyanocobalamin, SYRINGE 3CC/25GX1", pantoprazole, zolpidem, HYDROcodone-acetaminophen, and lidocaine. We will continue to administer sodium chloride.  Meds ordered this encounter  Medications  . ALPRAZolam (XANAX) 0.5 MG tablet    Sig: Take 0.5 tablets (0.25 mg total) by mouth daily as needed for anxiety.    Dispense:  15 tablet    Refill:  2    Medications Discontinued During This Encounter  Medication Reason  . amoxicillin-clavulanate (AUGMENTIN) 875-125 MG tablet Completed Course  . escitalopram (LEXAPRO) 10 MG tablet Patient has not taken in last 30 days  . predniSONE (DELTASONE) 10 MG tablet Completed Course    Follow-up: No follow-ups on file.   [name], MD

## 2018-01-25 NOTE — Patient Instructions (Signed)
I have refilled the alprazolam to use NOT DAILY.  1/2 tablet    Repeat prednisone taper for the left ear.  If still present after this,  ENT evaluation needed

## 2018-01-26 LAB — CBC WITH DIFFERENTIAL/PLATELET
Basophils Absolute: 0 cells/uL (ref 0–200)
Basophils Relative: 0 %
Eosinophils Absolute: 0 cells/uL — ABNORMAL LOW (ref 15–500)
Eosinophils Relative: 0 %
HCT: 29.1 % — ABNORMAL LOW (ref 35.0–45.0)
Hemoglobin: 10.2 g/dL — ABNORMAL LOW (ref 11.7–15.5)
Lymphs Abs: 865 cells/uL (ref 850–3900)
MCH: 33.1 pg — ABNORMAL HIGH (ref 27.0–33.0)
MCHC: 35.1 g/dL (ref 32.0–36.0)
MCV: 94.5 fL (ref 80.0–100.0)
MPV: 9 fL (ref 7.5–12.5)
Monocytes Relative: 7.4 %
Neutro Abs: 7747 cells/uL (ref 1500–7800)
Neutrophils Relative %: 83.3 %
Platelets: 378 10*3/uL (ref 140–400)
RBC: 3.08 10*6/uL — ABNORMAL LOW (ref 3.80–5.10)
RDW: 12.7 % (ref 11.0–15.0)
Total Lymphocyte: 9.3 %
WBC mixed population: 688 cells/uL (ref 200–950)
WBC: 9.3 10*3/uL (ref 3.8–10.8)

## 2018-01-26 LAB — TSH: TSH: 0.24 mIU/L — ABNORMAL LOW (ref 0.40–4.50)

## 2018-01-27 ENCOUNTER — Encounter: Payer: Self-pay | Admitting: Internal Medicine

## 2018-01-27 NOTE — Assessment & Plan Note (Signed)
She did not tolerate lexapro due to persistent fatigue after 4 days  of starting  At the low 5 mg dose...  Continue wellbutrin

## 2018-01-28 ENCOUNTER — Other Ambulatory Visit: Payer: Self-pay | Admitting: Internal Medicine

## 2018-01-28 NOTE — Telephone Encounter (Signed)
Refilled: 01/16/2018 Last OV: 01/25/2018 Next OV: 03/29/2017

## 2018-01-29 MED FILL — predniSONE 10 MG TABS: 10 | 8 days supply | Qty: 33 | Fill #0

## 2018-02-04 ENCOUNTER — Other Ambulatory Visit: Payer: Self-pay | Admitting: Family Medicine

## 2018-02-04 MED FILL — CELECOXIB 200 MG CAP: 200 | 30 days supply | Qty: 30 | Fill #2

## 2018-02-04 MED FILL — ZOLPIDEM TARTRATE 10 MG TAB: 10 | 30 days supply | Qty: 30 | Fill #2

## 2018-02-06 ENCOUNTER — Other Ambulatory Visit: Payer: Self-pay

## 2018-02-06 DIAGNOSIS — Q279 Congenital malformation of peripheral vascular system, unspecified: Secondary | ICD-10-CM | POA: Diagnosis not present

## 2018-02-06 MED ORDER — FLUTICASONE PROPIONATE 50 MCG/ACT NA SUSP: NASAL | 1 refills | Status: DC

## 2018-02-08 DIAGNOSIS — H524 Presbyopia: Secondary | ICD-10-CM | POA: Diagnosis not present

## 2018-02-08 MED ORDER — FLUTICASONE PROPIONATE 50 MCG/ACT NA SUSP: NASAL | 1 refills | Status: DC

## 2018-02-08 MED FILL — FLUTICASONE PROP 50 MCG SPR: 50 | 30 days supply | Qty: 16 | Fill #0

## 2018-02-15 ENCOUNTER — Telehealth: Payer: Self-pay

## 2018-02-15 MED ORDER — HYDROCODONE-ACETAMINOPHEN 7.5-325 MG PO TABS: 1.0000 | ORAL_TABLET | ORAL | 0 refills | Status: DC | PRN

## 2018-02-15 MED ORDER — PREDNISONE 10 MG PO TABS: ORAL_TABLET | ORAL | 0 refills | Status: DC

## 2018-02-15 MED FILL — HYDROCODON-APAP 7.5-325: 7.5-325 | 30 days supply | Qty: 180 | Fill #0

## 2018-02-15 MED FILL — predniSONE 10 MG TABS: 10 | 8 days supply | Qty: 33 | Fill #0

## 2018-02-15 NOTE — Telephone Encounter (Signed)
See my chart message

## 2018-02-15 NOTE — Telephone Encounter (Signed)
Copied from [place] [phone]. Topic: General - Other >> Feb 15, 2018 10:01 AM Reyne Dumas L wrote: Reason for CRM:   Pt states she would like a call from Dr. Lupita Dawn assistant regarding MyChart message that was sent last night. States that since it is Friday she would prefer a call instead of a response to [employer]. Pt can be reached at [phone]

## 2018-02-15 NOTE — Telephone Encounter (Signed)
You can call her.  The refills have been sent to [facility] and a my chart message has been sent

## 2018-02-18 MED ORDER — BUPROPION HCL ER (XL) 300 MG PO TB24: ORAL_TABLET | ORAL | 1 refills | Status: DC

## 2018-02-18 MED FILL — buPROPion HCL ER (XL) 300 M: 300 | 90 days supply | Qty: 90 | Fill #0

## 2018-02-18 NOTE — Telephone Encounter (Signed)
Called pt to make sure she was able to pick up her rxs over the weekend. Pt stated she did get them.

## 2018-03-12 ENCOUNTER — Other Ambulatory Visit (HOSPITAL_COMMUNITY): Payer: Self-pay | Admitting: Interventional Radiology

## 2018-03-12 ENCOUNTER — Inpatient Hospital Stay
Admission: RE | Admit: 2018-03-12 | Discharge: 2018-03-12 | Disposition: A | Discharge status: Home / Self Care | Payer: Self-pay | Source: Ambulatory Visit | Attending: Interventional Radiology | Admitting: Interventional Radiology

## 2018-03-12 ENCOUNTER — Other Ambulatory Visit: Payer: Self-pay | Admitting: Interventional Radiology

## 2018-03-12 DIAGNOSIS — R52 Pain, unspecified: Secondary | ICD-10-CM

## 2018-03-13 ENCOUNTER — Telehealth: Payer: Self-pay

## 2018-03-13 NOTE — Telephone Encounter (Signed)
There is a my chart message about this as well

## 2018-03-13 NOTE — Telephone Encounter (Signed)
Copied from [place] [id number]. Topic: Quick Communication - Rx Refill/Question >> Mar 13, 2018  3:36 PM Margot Ables wrote: Pt asking that Janett Billow call her about her medications and concerns. Pt said she doesn't know which medication but she's had this issue and needs to talk to [place].

## 2018-03-14 MED ORDER — HYDROCODONE-ACETAMINOPHEN 7.5-325 MG PO TABS: 1.0000 | ORAL_TABLET | ORAL | 0 refills | Status: DC | PRN

## 2018-03-14 MED ORDER — ZOLPIDEM TARTRATE 10 MG PO TABS: 10.0000 mg | ORAL_TABLET | Freq: Every evening | ORAL | 5 refills | Status: DC | PRN

## 2018-03-14 NOTE — Telephone Encounter (Signed)
Pt called back and stated that she is no longer working for [employer] and her new insurance will not take affect until the beginning of March. Pt is wanting to know if she can get a hard copy of her Hydrocodone prescription so that she can take it to the pharmacy that will be the cheapest for her.

## 2018-03-14 NOTE — Telephone Encounter (Signed)
See my chart message

## 2018-03-14 NOTE — Telephone Encounter (Signed)
LMTCB. Please transfer pt to our office.  

## 2018-03-29 ENCOUNTER — Ambulatory Visit: Payer: 59 | Admitting: Internal Medicine

## 2018-03-29 ENCOUNTER — Other Ambulatory Visit: Payer: Self-pay | Admitting: Internal Medicine

## 2018-03-29 MED ORDER — ALPRAZOLAM 0.5 MG PO TABS: 0.2500 mg | ORAL_TABLET | Freq: Every day | ORAL | 2 refills | Status: DC | PRN

## 2018-03-29 MED ORDER — HYDROCODONE-ACETAMINOPHEN 7.5-325 MG PO TABS: 1.0000 | ORAL_TABLET | ORAL | 0 refills | Status: DC | PRN

## 2018-03-29 MED ORDER — ZOLPIDEM TARTRATE 10 MG PO TABS: 10.0000 mg | ORAL_TABLET | Freq: Every evening | ORAL | 5 refills | Status: DC | PRN

## 2018-04-01 ENCOUNTER — Telehealth: Payer: Self-pay

## 2018-04-01 MED ORDER — PANTOPRAZOLE SODIUM 40 MG PO TBEC: 40.0000 mg | DELAYED_RELEASE_TABLET | Freq: Every day | ORAL | 1 refills | Status: DC

## 2018-04-01 MED ORDER — FLUTICASONE PROPIONATE 50 MCG/ACT NA SUSP: NASAL | 1 refills | Status: DC

## 2018-04-01 MED ORDER — CELECOXIB 200 MG PO CAPS: ORAL_CAPSULE | ORAL | 5 refills | Status: DC

## 2018-04-01 MED ORDER — AMLODIPINE BESYLATE 10 MG PO TABS: ORAL_TABLET | ORAL | 1 refills | Status: DC

## 2018-04-01 MED ORDER — CYANOCOBALAMIN 1000 MCG/ML IJ SOLN: 1000.0000 ug | INTRAMUSCULAR | 11 refills | Status: DC

## 2018-04-01 NOTE — Telephone Encounter (Signed)
-----   Message from [name], MD sent at 03/29/2018 10:48 AM EST ----- Regarding: refills I have refilled her Vicodin,  alprazolam and ambien and sent them to her new pharmacy ([facility] in [place]).  Can you find out what else she needs refills on and send them ?   She needs an appointment in mid April

## 2018-04-01 NOTE — Telephone Encounter (Signed)
Medications have been refilled and [facility] to [facility] [place].

## 2018-04-01 NOTE — Telephone Encounter (Signed)
Error

## 2018-04-11 ENCOUNTER — Other Ambulatory Visit: Payer: Self-pay | Admitting: Internal Medicine

## 2018-04-19 MED ORDER — PANTOPRAZOLE SODIUM 40 MG PO TBEC: 40.0000 mg | DELAYED_RELEASE_TABLET | Freq: Two times a day (BID) | ORAL | 0 refills | Status: DC

## 2018-04-19 NOTE — Telephone Encounter (Signed)
According to the chart the pt is only taking this medication once daily.

## 2018-04-24 ENCOUNTER — Other Ambulatory Visit: Payer: Self-pay | Admitting: Internal Medicine

## 2018-05-17 ENCOUNTER — Other Ambulatory Visit: Payer: Self-pay | Admitting: Internal Medicine

## 2018-05-24 ENCOUNTER — Ambulatory Visit: Payer: Self-pay | Admitting: Internal Medicine

## 2018-05-28 ENCOUNTER — Ambulatory Visit (INDEPENDENT_AMBULATORY_CARE_PROVIDER_SITE_OTHER): Payer: 59 | Admitting: Internal Medicine

## 2018-05-28 DIAGNOSIS — D508 Other iron deficiency anemias: Secondary | ICD-10-CM

## 2018-05-28 DIAGNOSIS — I10 Essential (primary) hypertension: Secondary | ICD-10-CM

## 2018-05-28 DIAGNOSIS — F321 Major depressive disorder, single episode, moderate: Secondary | ICD-10-CM

## 2018-05-28 DIAGNOSIS — M159 Polyosteoarthritis, unspecified: Secondary | ICD-10-CM

## 2018-05-28 DIAGNOSIS — M8949 Other hypertrophic osteoarthropathy, multiple sites: Secondary | ICD-10-CM

## 2018-05-28 DIAGNOSIS — F418 Other specified anxiety disorders: Secondary | ICD-10-CM

## 2018-05-28 DIAGNOSIS — E538 Deficiency of other specified B group vitamins: Secondary | ICD-10-CM | POA: Diagnosis not present

## 2018-05-28 DIAGNOSIS — M48061 Spinal stenosis, lumbar region without neurogenic claudication: Secondary | ICD-10-CM

## 2018-05-28 DIAGNOSIS — R7989 Other specified abnormal findings of blood chemistry: Secondary | ICD-10-CM | POA: Diagnosis not present

## 2018-05-28 DIAGNOSIS — M150 Primary generalized (osteo)arthritis: Secondary | ICD-10-CM

## 2018-05-28 MED ORDER — BUPROPION HCL ER (XL) 450 MG PO TB24: ORAL_TABLET | ORAL | 1 refills | Status: DC

## 2018-05-28 MED ORDER — HYDROCODONE-ACETAMINOPHEN 7.5-325 MG PO TABS: 1.0000 | ORAL_TABLET | ORAL | 0 refills | Status: DC | PRN

## 2018-05-28 MED ORDER — BUSPIRONE HCL 10 MG PO TABS: ORAL_TABLET | ORAL | 0 refills | Status: DC

## 2018-05-28 NOTE — Patient Instructions (Addendum)
We are increasing the wellbutrin  to 450 mg daily to manage your depression  I am adding buspirone to keep you calm without sedation,  You can start with 1/2 tablet and take it every 8 hours if needed   Continue omeprazole only for GERD/gastritis,  When you finish your current bottle,  Start the protonix (pantoprazole)  Suspend biotin for 4 days prior to labs (thyroid recheck)   Lab Results  Component Value Date   TSH 0.24 (L) 01/25/2018

## 2018-05-28 NOTE — Progress Notes (Signed)
Virtual Visit via WebEx Note  This visit type was conducted due to national recommendations for restrictions regarding the COVID-19 pandemic (e.g. social distancing).  This format is felt to be most appropriate for this patient at this time.  All issues noted in this document were discussed and addressed.  No physical exam was performed (except for noted visual exam findings with Video Visits).   I connected with@ on 05/29/18 at  3:00 PM EDT by a video enabled telemedicine application or telephone and verified that I am speaking with the correct person using two identifiers. Location patient: home Location provider: work or home office Persons participating in the virtual visit: patient, provider  I discussed the limitations, risks, security and privacy concerns of performing an evaluation and management service by telephone and the availability of in person appointments. I also discussed with the patient that there may be a patient responsible charge related to this service. The patient expressed understanding and agreed to proceed.   Reason for visit: medication refill   HPI:  Follow up on hypertension, anxiety/depression , menopause and chronic pain managed with vicodin . No longer taking celebrex due to change in GFR    GERD:  Taking 2 PPIs, omeprazole and pantoprazole..  Discussed need to reduce use to one daily   Depressed.  Aggravated by unemployment.  No motivation. .  Sleeping more. discussed increase in wellbutrin and adding buspirone for anxiety since she did not tolerate lowest dose of alprazolam.      Had an IBS episode when [name] was sick. Nausea bloating and pain , now resolved.   Chronic pain:  Using vicodin,,  celebrex was helping . Refill history confirmed via [place] Controlled Substance databas, accessed by me today..  Overactive  Thyroid: symptoms ,  TSH was suppressed,  While taking biotin   ROS: See pertinent positives and negatives per HPI.  Past Medical History:   Diagnosis Date  . Allergy   . Arthritis   . 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 B12 deficiency 01/2016  . Candida esophagitis ([place])   . Degenerative joint disease involving multiple joints   . Depression   . Dizziness   . Hypertension   . Pneumonia    approx 6 years ago  . Scoliosis   . Spinal stenosis of lumbar region   . Tubular adenoma of colon     Past Surgical History:  Procedure Laterality Date  . ABDOMINAL HYSTERECTOMY    . APPENDECTOMY    . CARDIAC CATHETERIZATION     normal coronaries, no wall motion abnormalities 11/02/09 [facility])  . NASAL SINUS SURGERY    . OOPHORECTOMY    . RADIOLOGY WITH ANESTHESIA  02/26/2012   Procedure: RADIOLOGY WITH ANESTHESIA;  Surgeon: [name], MD;  Location: [place];  Service: Radiology;  Laterality: N/A;  . REVERSE SHOULDER ARTHROPLASTY Right 06/27/2013   Procedure: REVERSE RIGHT TOTAL SHOULDER ARTHROPLASTY;  Surgeon: [name], MD;  Location: [place];  Service: Orthopedics;  Laterality: Right;  . SHOULDER ARTHROSCOPY WITH ROTATOR CUFF REPAIR AND SUBACROMIAL DECOMPRESSION  2007   left  . TONSILLECTOMY  adnoids    Family History  Problem Relation Age of Onset  . Colon cancer Father 98  . Diabetes Father   . Heart disease Father   . Rectal cancer Father   . Thyroid disease Mother   . Colon cancer Paternal Grandfather   . Colon cancer Other  pggm  . Breast cancer Paternal Grandmother   . Stomach cancer Neg Hx     SOCIAL HX: unemployed, married   Current Outpatient Medications:  .  amLODipine (NORVASC) 10 MG tablet, TAKE 1 TABLET (10 MG TOTAL) BY MOUTH DAILY., Disp: 90 tablet, Rfl: 1 .  aspirin EC 81 MG tablet, Take 81 mg by mouth daily., Disp: , Rfl:  .  buPROPion 450 MG TB24, TAKE 1 TABLET BY MOUTH EVERY DAY, Disp: 30 tablet, Rfl: 1 .  cyanocobalamin (,VITAMIN B-12,) 1000 MCG/ML injection, Inject 1 mL (1,000 mcg total) into the muscle once a week., Disp:  10 mL, Rfl: 11 .  [START ON 06/25/2018] HYDROcodone-acetaminophen (NORCO) 7.5-325 MG tablet, Take 1 tablet by mouth every 4 (four) hours as needed for moderate pain., Disp: 180 tablet, Rfl: 0 .  pantoprazole (PROTONIX) 40 MG tablet, Take 1 tablet (40 mg total) by mouth 2 (two) times daily., Disp: 60 tablet, Rfl: 0 .  Syringe/Needle, Disp, (SYRINGE 3CC/25GX1") 25G X 1" 3 ML MISC, Use for b12 injections, Disp: 50 each, Rfl: 0 .  zolpidem (AMBIEN) 10 MG tablet, Take 1 tablet (10 mg total) by mouth at bedtime as needed. for sleep, Disp: 30 tablet, Rfl: 5 .  busPIRone (BUSPAR) 10 MG tablet, 1 TABLET 2 TO 3 TIMES DAILY FOR ANXIETY, Disp: 90 tablet, Rfl: 0 .  celecoxib (CELEBREX) 200 MG capsule, TAKE 1 CAPSULE BY MOUTH EVERY DAY (Patient not taking: Reported on 05/28/2018), Disp: 30 capsule, Rfl: 5 .  fluticasone (FLONASE) 50 MCG/ACT nasal spray, USE 2 SPRAYS IN EACH NOSTRIL DAILY AS NEEDED, Disp: 16 g, Rfl: 1 .  lidocaine (XYLOCAINE) 2 % solution, , Disp: , Rfl: 0  Current Facility-Administered Medications:  .  0.9 %  sodium chloride infusion, 500 mL, Intravenous, Continuous, Pyrtle, Lajuan Lines, MD  EXAM:  VITALS per patient if applicable:  GENERAL: alert, oriented, appears well and in no acute distress  HEENT: atraumatic, conjunttiva clear, no obvious abnormalities on inspection of external nose and ears  NECK: normal movements of the head and neck  LUNGS: on inspection no signs of respiratory distress, breathing rate appears normal, no obvious gross SOB, gasping or wheezing  CV: no obvious cyanosis  MS: moves all visible extremities without noticeable abnormality  PSYCH/NEURO: pleasant and cooperative, no obvious depression or anxiety, speech and thought processing grossly intact  ASSESSMENT AND PLAN  Hypertension Improved control with amlodipine 10 mg daily .  No changes today as home readings have been <  140/80    Degenerative joint disease involving multiple joints Resume celebrex.  Monitor BP and Cr.   Spinal stenosis of lumbar region She has chronic back pain Complicated by scoliosis. She is unable to sit for prolonged periods of time without increased pain.  She uses a standing desk when at home. She continues to require 6 vicodin daily to manage her pain along with celebrex and tizanidine/   Refill history confirmed via [place] Controlled Substance database, accessed by me today..REfills for 3 months given .  Continue tizanidine for prn use   Major depressive disorder, single episode, moderate (HCC) Aggravated by loss of job .  increase wellbutrin dose to 450 mg again,  Adding buspirone for anxiety   Anxiety associated with depression She did not tolerate lexapro due to persistent fatigue after 4 days  of starting  At the low 5 mg dose...  Continue wellbutrin and add buspirone   Abnormal TSH Suspect the TSH was skewed by use of biotin.  Repeat ordered   Updated Medication List Outpatient Encounter Medications as of 05/28/2018  Medication Sig  . amLODipine (NORVASC) 10 MG tablet TAKE 1 TABLET (10 MG TOTAL) BY MOUTH DAILY.  Marland Kitchen aspirin EC 81 MG tablet Take 81 mg by mouth daily.  Marland Kitchen buPROPion 450 MG TB24 TAKE 1 TABLET BY MOUTH EVERY DAY  . cyanocobalamin (,VITAMIN B-12,) 1000 MCG/ML injection Inject 1 mL (1,000 mcg total) into the muscle once a week.  Derrill Memo ON 06/25/2018] HYDROcodone-acetaminophen (NORCO) 7.5-325 MG tablet Take 1 tablet by mouth every 4 (four) hours as needed for moderate pain.  . pantoprazole (PROTONIX) 40 MG tablet Take 1 tablet (40 mg total) by mouth 2 (two) times daily.  . Syringe/Needle, Disp, (SYRINGE 3CC/25GX1") 25G X 1" 3 ML MISC Use for b12 injections  . zolpidem (AMBIEN) 10 MG tablet Take 1 tablet (10 mg total) by mouth at bedtime as needed. for sleep  . [DISCONTINUED] ALPRAZolam (XANAX) 0.5 MG tablet Take 0.5 tablets (0.25 mg total) by mouth daily as needed for anxiety.  . [DISCONTINUED] buPROPion (WELLBUTRIN XL) 300 MG 24 hr tablet TAKE 1 TABLET  BY MOUTH EVERY DAY  . [DISCONTINUED] fluticasone (FLONASE) 50 MCG/ACT nasal spray USE 2 SPRAYS IN EACH NOSTRIL DAILY AS NEEDED  . [DISCONTINUED] HYDROcodone-acetaminophen (NORCO) 7.5-325 MG tablet Take 1 tablet by mouth every 4 (four) hours as needed for moderate pain. May refill on or after Mar 19 2018  . [DISCONTINUED] HYDROcodone-acetaminophen (NORCO) 7.5-325 MG tablet Take 1 tablet by mouth every 4 (four) hours as needed for moderate pain. May refill on or after Jan 18, 2018  . [DISCONTINUED] metoprolol succinate (TOPROL-XL) 25 MG 24 hr tablet Take 25 mg by mouth daily.  . [DISCONTINUED] omeprazole (PRILOSEC) 40 MG capsule TAKE 1 CAPSULE BY MOUTH EVERY DAY  . busPIRone (BUSPAR) 10 MG tablet 1 TABLET 2 TO 3 TIMES DAILY FOR ANXIETY  . celecoxib (CELEBREX) 200 MG capsule TAKE 1 CAPSULE BY MOUTH EVERY DAY (Patient not taking: Reported on 05/28/2018)  . lidocaine (XYLOCAINE) 2 % solution   . [DISCONTINUED] ferrous sulfate 325 (65 FE) MG EC tablet Take 1 tablet (325 mg total) by mouth daily after breakfast.  . [DISCONTINUED] metFORMIN (GLUCOPHAGE) 500 MG tablet Take 500 mg by mouth 2 (two) times daily with a meal.    . [DISCONTINUED] predniSONE (DELTASONE) 10 MG tablet TAKE 6 TABLETS BY MOUTH DAYS 1-3, THEN DECREASE BY 1 TAB DAILY UNTIL GONE (Patient not taking: Reported on 05/28/2018)   Facility-Administered Encounter Medications as of 05/28/2018  Medication  . 0.9 %  sodium chloride infusion      I discussed the assessment and treatment plan with the patient. The patient was provided an opportunity to ask questions and all were answered. The patient agreed with the plan and demonstrated an understanding of the instructions.   The patient was advised to call back or seek an in-person evaluation if the symptoms worsen or if the condition fails to improve as anticipated.  I provided 25 minutes of non-face-to-face time during this encounter.

## 2018-05-29 ENCOUNTER — Other Ambulatory Visit: Payer: Self-pay | Admitting: Internal Medicine

## 2018-05-29 DIAGNOSIS — R7989 Other specified abnormal findings of blood chemistry: Secondary | ICD-10-CM | POA: Insufficient documentation

## 2018-05-29 NOTE — Assessment & Plan Note (Signed)
Resume celebrex. Monitor BP and Cr.

## 2018-05-29 NOTE — Assessment & Plan Note (Signed)
She did not tolerate lexapro due to persistent fatigue after 4 days  of starting  At the low 5 mg dose...  Continue wellbutrin and add buspirone

## 2018-05-29 NOTE — Assessment & Plan Note (Signed)
Improved control with amlodipine 10 mg daily .  No changes today as home readings have been <  140/80

## 2018-05-29 NOTE — Assessment & Plan Note (Signed)
She has chronic back pain Complicated by scoliosis. She is unable to sit for prolonged periods of time without increased pain.  She uses a standing desk when at home. She continues to require 6 vicodin daily to manage her pain along with celebrex and tizanidine/   Refill history confirmed via [place] Controlled Substance database, accessed by me today..REfills for 3 months given .  Continue tizanidine for prn use

## 2018-05-29 NOTE — Assessment & Plan Note (Signed)
Suspect the TSH was skewed by use of biotin.  Repeat ordered

## 2018-05-29 NOTE — Assessment & Plan Note (Signed)
Aggravated by loss of job .  increase wellbutrin dose to 450 mg again,  Adding buspirone for anxiety

## 2018-06-11 ENCOUNTER — Telehealth: Payer: Self-pay | Admitting: Internal Medicine

## 2018-06-11 ENCOUNTER — Other Ambulatory Visit: Payer: Self-pay | Admitting: Internal Medicine

## 2018-06-11 ENCOUNTER — Telehealth: Payer: Self-pay

## 2018-06-11 MED ORDER — BUPROPION HCL ER (XL) 150 MG PO TB24: 450.0000 mg | ORAL_TABLET | Freq: Every day | ORAL | 11 refills | Status: DC

## 2018-06-11 NOTE — Telephone Encounter (Signed)
PA for hydrocodone has been submitted on covermymeds.

## 2018-06-11 NOTE — Telephone Encounter (Signed)
I sent  A new rx for the the wellbutrin   xl 150 mg tablet with directions  To take  "3 tablets daily"  [facility]

## 2018-06-11 NOTE — Telephone Encounter (Signed)
Insurance will not pay for the Wellbutrin 450mg  but they will pay for the 150mg  three times daily. Pt would like to see about getting the rx resent to pharmacy.

## 2018-06-11 NOTE — Telephone Encounter (Signed)
Pt  Would like Jessica to call her about her Wellbutrin, see CRM in pt's chart.

## 2018-06-18 DIAGNOSIS — G894 Chronic pain syndrome: Secondary | ICD-10-CM

## 2018-06-24 DIAGNOSIS — C4492 Squamous cell carcinoma of skin, unspecified: Secondary | ICD-10-CM

## 2018-06-24 DIAGNOSIS — C44722 Squamous cell carcinoma of skin of right lower limb, including hip: Secondary | ICD-10-CM | POA: Diagnosis not present

## 2018-06-24 HISTORY — DX: Squamous cell carcinoma of skin, unspecified: C44.92

## 2018-06-28 ENCOUNTER — Other Ambulatory Visit: Payer: Self-pay | Admitting: Internal Medicine

## 2018-07-03 ENCOUNTER — Other Ambulatory Visit: Payer: Self-pay | Admitting: Internal Medicine

## 2018-07-03 MED ORDER — FAMCICLOVIR 500 MG PO TABS: 500.0000 mg | ORAL_TABLET | Freq: Three times a day (TID) | ORAL | 3 refills | Status: DC

## 2018-07-07 ENCOUNTER — Other Ambulatory Visit: Payer: Self-pay | Admitting: Internal Medicine

## 2018-07-11 ENCOUNTER — Other Ambulatory Visit: Payer: Self-pay | Admitting: Internal Medicine

## 2018-07-11 MED ORDER — OMEPRAZOLE 40 MG PO CPDR: 40.0000 mg | DELAYED_RELEASE_CAPSULE | Freq: Two times a day (BID) | ORAL | 5 refills | Status: DC

## 2018-07-23 MED ORDER — HYDROCODONE-ACETAMINOPHEN 7.5-325 MG PO TABS: 1.0000 | ORAL_TABLET | ORAL | 0 refills | Status: DC | PRN

## 2018-07-24 ENCOUNTER — Telehealth: Payer: Self-pay | Admitting: Internal Medicine

## 2018-07-24 NOTE — Telephone Encounter (Signed)
Spoke with pt to let her know that the medicaiton was sent in on 07/23/2018 and can be filled on 07/26/2018. Pt stated that she did get a call from the pharmacy stating that they had it.

## 2018-07-24 NOTE — Telephone Encounter (Signed)
Copied from [place] [phone]. Topic: Quick Communication - Rx Refill/Question >> Jul 24, 2018 10:51 AM Reyne Dumas L wrote: Medication: HYDROcodone-acetaminophen (NORCO) 7.5-325 MG tablet  See MyChart Message.  This was not called in for pt.  Preferred Pharmacy (with phone number or street name): [id number] [place], [place] [phone] (Phone) [phone] (Fax)  Agent: Please be advised that RX refills may take up to 3 business days. We ask that you follow-up with your pharmacy.

## 2018-07-25 ENCOUNTER — Other Ambulatory Visit: Payer: 59

## 2018-07-31 ENCOUNTER — Other Ambulatory Visit: Payer: 59

## 2018-08-01 ENCOUNTER — Other Ambulatory Visit (INDEPENDENT_AMBULATORY_CARE_PROVIDER_SITE_OTHER): Payer: 59

## 2018-08-01 ENCOUNTER — Other Ambulatory Visit: Payer: Self-pay

## 2018-08-01 DIAGNOSIS — D508 Other iron deficiency anemias: Secondary | ICD-10-CM | POA: Diagnosis not present

## 2018-08-01 DIAGNOSIS — R7989 Other specified abnormal findings of blood chemistry: Secondary | ICD-10-CM

## 2018-08-01 DIAGNOSIS — E538 Deficiency of other specified B group vitamins: Secondary | ICD-10-CM | POA: Diagnosis not present

## 2018-08-01 DIAGNOSIS — I10 Essential (primary) hypertension: Secondary | ICD-10-CM

## 2018-08-02 LAB — CBC WITH DIFFERENTIAL/PLATELET
Basophils Absolute: 0.1 10*3/uL (ref 0.0–0.1)
Basophils Relative: 1.1 % (ref 0.0–3.0)
Eosinophils Absolute: 0.1 10*3/uL (ref 0.0–0.7)
Eosinophils Relative: 1.1 % (ref 0.0–5.0)
HCT: 31.5 % — ABNORMAL LOW (ref 36.0–46.0)
Hemoglobin: 10.8 g/dL — ABNORMAL LOW (ref 12.0–15.0)
Lymphocytes Relative: 26.4 % (ref 12.0–46.0)
Lymphs Abs: 1.4 10*3/uL (ref 0.7–4.0)
MCHC: 34.3 g/dL (ref 30.0–36.0)
MCV: 97.8 fl (ref 78.0–100.0)
Monocytes Absolute: 0.7 10*3/uL (ref 0.1–1.0)
Monocytes Relative: 13.7 % — ABNORMAL HIGH (ref 3.0–12.0)
Neutro Abs: 3.1 10*3/uL (ref 1.4–7.7)
Neutrophils Relative %: 57.7 % (ref 43.0–77.0)
Platelets: 345 10*3/uL (ref 150.0–400.0)
RBC: 3.22 Mil/uL — ABNORMAL LOW (ref 3.87–5.11)
RDW: 12.8 % (ref 11.5–15.5)
WBC: 5.4 10*3/uL (ref 4.0–10.5)

## 2018-08-02 LAB — TSH: TSH: 1.31 u[IU]/mL (ref 0.35–4.50)

## 2018-08-02 LAB — BASIC METABOLIC PANEL
BUN: 25 mg/dL — ABNORMAL HIGH (ref 6–23)
CO2: 25 mEq/L (ref 19–32)
Calcium: 9 mg/dL (ref 8.4–10.5)
Chloride: 98 mEq/L (ref 96–112)
Creatinine, Ser: 1.48 mg/dL — ABNORMAL HIGH (ref 0.40–1.20)
GFR: 35.67 mL/min — ABNORMAL LOW (ref >60.00)
Glucose, Bld: 70 mg/dL (ref 70–99)
Potassium: 4.1 mEq/L (ref 3.5–5.1)
Sodium: 134 mEq/L — ABNORMAL LOW (ref 135–145)

## 2018-08-02 LAB — IRON,TIBC AND FERRITIN PANEL
%SAT: 44 % (calc) (ref 16–45)
Ferritin: 57 ng/mL (ref 16–288)
Iron: 165 ug/dL — ABNORMAL HIGH (ref 45–160)
TIBC: 377 mcg/dL (calc) (ref 250–450)

## 2018-08-02 LAB — VITAMIN B12: Vitamin B-12: 356 pg/mL (ref 211–911)

## 2018-08-06 ENCOUNTER — Other Ambulatory Visit: Payer: Self-pay | Admitting: Internal Medicine

## 2018-08-20 ENCOUNTER — Telehealth: Payer: Self-pay

## 2018-08-20 NOTE — Telephone Encounter (Signed)
Copied from [place] [phone]. Topic: General - Other >> Aug 20, 2018  3:34 PM Leward Quan A wrote: Reason for CRM: Patient asked for Janett Billow to give her a call in reference to Rx she need to pick up tomorrow 08/21/2018. Ph#  [phone]

## 2018-08-21 ENCOUNTER — Encounter: Payer: Self-pay | Admitting: Internal Medicine

## 2018-08-21 ENCOUNTER — Ambulatory Visit (INDEPENDENT_AMBULATORY_CARE_PROVIDER_SITE_OTHER): Payer: 59 | Admitting: Internal Medicine

## 2018-08-21 ENCOUNTER — Telehealth: Payer: Self-pay | Admitting: Internal Medicine

## 2018-08-21 ENCOUNTER — Telehealth: Payer: Self-pay

## 2018-08-21 ENCOUNTER — Other Ambulatory Visit: Payer: Self-pay

## 2018-08-21 DIAGNOSIS — M150 Primary generalized (osteo)arthritis: Secondary | ICD-10-CM | POA: Diagnosis not present

## 2018-08-21 DIAGNOSIS — R140 Abdominal distension (gaseous): Secondary | ICD-10-CM | POA: Diagnosis not present

## 2018-08-21 DIAGNOSIS — F418 Other specified anxiety disorders: Secondary | ICD-10-CM | POA: Diagnosis not present

## 2018-08-21 DIAGNOSIS — M8949 Other hypertrophic osteoarthropathy, multiple sites: Secondary | ICD-10-CM

## 2018-08-21 DIAGNOSIS — M159 Polyosteoarthritis, unspecified: Secondary | ICD-10-CM

## 2018-08-21 DIAGNOSIS — R944 Abnormal results of kidney function studies: Secondary | ICD-10-CM | POA: Diagnosis not present

## 2018-08-21 DIAGNOSIS — N183 Chronic kidney disease, stage 3 unspecified: Secondary | ICD-10-CM

## 2018-08-21 DIAGNOSIS — G894 Chronic pain syndrome: Secondary | ICD-10-CM

## 2018-08-21 MED ORDER — HYDROCODONE-ACETAMINOPHEN 7.5-325 MG PO TABS: 1.0000 | ORAL_TABLET | ORAL | 0 refills | Status: DC | PRN

## 2018-08-21 MED ORDER — PREDNISONE 10 MG PO TABS: ORAL_TABLET | ORAL | 0 refills | Status: DC

## 2018-08-21 MED ORDER — CYANOCOBALAMIN 1000 MCG/ML IJ SOLN: 1000.0000 ug | INTRAMUSCULAR | 11 refills | Status: DC

## 2018-08-21 MED ORDER — "SYRINGE 25G X 1"" 3 ML MISC": 0 refills | Status: AC

## 2018-08-21 NOTE — Progress Notes (Signed)
. Virtual Visit via [email or web address]  This visit type was conducted due to national recommendations for restrictions regarding the COVID-19 pandemic (e.g. social distancing).  This format is felt to be most appropriate for this patient at this time.  All issues noted in this document were discussed and addressed.  No physical exam was performed (except for noted visual exam findings with Video Visits).   I connected with@ on 08/21/18 at  3:30 PM EDT by a video enabled telemedicine application or telephone and verified that I am speaking with the correct person using two identifiers. Location patient: home Location provider: work or home office Persons participating in the virtual visit: patient, provider  I discussed the limitations, risks, security and privacy concerns of performing an evaluation and management service by telephone and the availability of in person appointments. I also discussed with the patient that there may be a patient responsible charge related to this service. The patient expressed understanding and agreed to proceed.    Reason for visit: follow up.  bloaitng,  Increased pain and ACHINESS    HPI:  63 yr old female with history  of chronic pain syndrome secondary to OA and DJD of multiple joints managed with Vicodin.. hypertension and CKD presents with malaise of several weeks.  A new feeling of abdominal bloating around her mid section accompanied by weight gain,despite lack of appetite.  Her joint have been aching a lot and she has taken  more vicodin on several occasions to manage afternoon and evening pain .  She denies any excessive alcohol use.   CKD:  Recent GFR was significantly decreased compared to prior   Assessments.  NSAIDS were stopped,  Need for increased hydration and referral to nephrology. She has done her research and wants to see Dr Lorrene Reid in [place]     ROS: See pertinent positives and negatives per HPI.  Past Medical History:  Diagnosis Date  . Allergy   .  Arthritis   . 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 B12 deficiency 01/2016  . Candida esophagitis ([place])   . Degenerative joint disease involving multiple joints   . Depression   . Dizziness   . Hypertension   . Pneumonia    approx 6 years ago  . Scoliosis   . Spinal stenosis of lumbar region   . Tubular adenoma of colon     Past Surgical History:  Procedure Laterality Date  . ABDOMINAL HYSTERECTOMY    . APPENDECTOMY    . CARDIAC CATHETERIZATION     normal coronaries, no wall motion abnormalities 11/02/09 [facility])  . NASAL SINUS SURGERY    . OOPHORECTOMY    . RADIOLOGY WITH ANESTHESIA  02/26/2012   Procedure: RADIOLOGY WITH ANESTHESIA;  Surgeon: [name], MD;  Location: [place];  Service: Radiology;  Laterality: N/A;  . REVERSE SHOULDER ARTHROPLASTY Right 06/27/2013   Procedure: REVERSE RIGHT TOTAL SHOULDER ARTHROPLASTY;  Surgeon: [name], MD;  Location: [place];  Service: Orthopedics;  Laterality: Right;  . SHOULDER ARTHROSCOPY WITH ROTATOR CUFF REPAIR AND SUBACROMIAL DECOMPRESSION  2007   left  . TONSILLECTOMY  adnoids    Family History  Problem Relation Age of Onset  . Colon cancer Father 81  . Diabetes Father   . Heart disease Father   . Rectal cancer Father   . Thyroid disease Mother   . Colon cancer Paternal Grandfather   . Colon cancer Other  pggm  . Breast cancer Paternal Grandmother   . Stomach cancer Neg Hx     SOCIAL HX:  reports that she has never smoked. She has never used smokeless tobacco. She reports current alcohol use of about 2.0 standard drinks of alcohol per week. She reports that she does not use drugs.   Current Outpatient Medications:  .  amLODipine (NORVASC) 10 MG tablet, TAKE 1 TABLET (10 MG TOTAL) BY MOUTH DAILY., Disp: 90 tablet, Rfl: 1 .  buPROPion (WELLBUTRIN XL) 150 MG 24 hr tablet, TAKE 3 TABLETS (450 MG TOTAL) BY MOUTH DAILY. (Patient taking differently: Take 300 mg  by mouth daily. ), Disp: 270 tablet, Rfl: 1 .  busPIRone (BUSPAR) 10 MG tablet, TAKE 1 TABLET BY MOUTH 2-3 TIMES DAILY FOR ANXIETY, Disp: 270 tablet, Rfl: 1 .  cyanocobalamin (,VITAMIN B-12,) 1000 MCG/ML injection, Inject 1 mL (1,000 mcg total) into the muscle once a week., Disp: 10 mL, Rfl: 11 .  fluticasone (FLONASE) 50 MCG/ACT nasal spray, USE 2 SPRAYS IN EACH NOSTRIL DAILY AS NEEDED, Disp: 16 g, Rfl: 1 .  omeprazole (PRILOSEC) 40 MG capsule, Take 1 capsule (40 mg total) by mouth 2 (two) times a day., Disp: 60 capsule, Rfl: 5 .  Syringe/Needle, Disp, (SYRINGE 3CC/25GX1") 25G X 1" 3 ML MISC, Use for b12 injections, Disp: 50 each, Rfl: 0 .  zolpidem (AMBIEN) 10 MG tablet, Take 1 tablet (10 mg total) by mouth at bedtime as needed. for sleep, Disp: 30 tablet, Rfl: 5 .  ALPRAZolam (XANAX) 0.25 MG tablet, Take 1 tablet (0.25 mg total) by mouth daily as needed for anxiety or sleep., Disp: 30 tablet, Rfl: 5 .  HYDROcodone-acetaminophen (NORCO) 7.5-325 MG tablet, Take 1-2 tablets by mouth every 4 (four) hours as needed for moderate pain., Disp: 220 tablet, Rfl: 0 .  metoprolol succinate (TOPROL-XL) 25 MG 24 hr tablet, TAKE 1 TABLET BY MOUTH EVERY DAY (Patient not taking: Reported on 08/21/2018), Disp: 90 tablet, Rfl: 1 .  predniSONE (DELTASONE) 10 MG tablet, 6 tablets daily for 3 days   then reduce by 1 tablet daily until gone, Disp: 33 tablet, Rfl: 0  Current Facility-Administered Medications:  .  0.9 %  sodium chloride infusion, 500 mL, Intravenous, Continuous, Pyrtle, Lajuan Lines, MD  EXAM:  VITALS per patient if applicable:  GENERAL: alert, oriented, appears well and in no acute distress  HEENT: atraumatic, conjunttiva clear, no obvious abnormalities on inspection of external nose and ears  NECK: normal movements of the head and neck  LUNGS: on inspection no signs of respiratory distress, breathing rate appears normal, no obvious gross SOB, gasping or wheezing  CV: no obvious cyanosis  MS: moves  all visible extremities without noticeable abnormality  PSYCH/NEURO: pleasant and cooperative, no obvious depression or anxiety, speech and thought processing grossly intact  ASSESSMENT AND PLAN:   Anxiety associated with depression She did not tolerate lexapro due to persistent fatigue after 4 days  of starting  At the low 5 mg dose...  Continue wellbutrin and buspirone   Degenerative joint disease involving multiple joints She has chronic back pain Complicated by scoliosis. She is unable to sit for prolonged periods of time without increased pain.  She uses a standing desk when at home. She continues to require 6 vicodin daily to manage her pain along with celebrex and tizanidine/   Refill history confirmed via [place] Controlled Substance database, accessed by me today..REfills for 3 months given .  Continue tizanidine for prn use  Abdominal bloating Given her history of tylenol and alcohol use.  Will obtain an ultrasoumd of liver to rule out ascites   Chronic kidney disease, stage 3, mod decreased GFR (HCC) My be aggravated b celebrex use and dehydration.  Both have been addressed and repeat labs in 2 weeks ; nephrology referral made.  24 hour urine creatinine and renal ultrasound ordered   Decreased GFR Repeat assessment need as change appears to be due to dehydration     I discussed the assessment and treatment plan with the patient. The patient was provided an opportunity to ask questions and all were answered. The patient agreed with the plan and demonstrated an understanding of the instructions.   The patient was advised to call back or seek an in-person evaluation if the symptoms worsen or if the condition fails to improve as anticipated.  I provided 40  minutes of non-face-to-face time during this encounter.   [name], MD

## 2018-08-21 NOTE — Telephone Encounter (Signed)
Spoke with pt and she stated that she is going to be going out of town tomorrow and will be gone for a while. She wanted to make sure that she would be able to get her medications filled while she is down there. I told her that we would need to know that pharmacy and the address so we can look it up in epic. Pt is going to find information and let us know during her virtual visit at 3:30pm today.

## 2018-08-21 NOTE — Telephone Encounter (Signed)
[facility] [place]. The pt is going out of town at [employer] and the rx cant be filled until tomorrow so she is going to get it filled down at [employer] where she will be. The pharmacy has been added to the pt's chart.

## 2018-08-21 NOTE — Telephone Encounter (Signed)
Pt stated her pharmacy instructed her that she cannot pick up HYDROcodone-acetaminophen (NORCO) 7.5-325 MG tablet Until tomorrow however she is leaving in the morning at 5am. She would like to speak with Dr. Derrel Nip or someone who can help with this as soon as possible. She just finished a virtual with Dr. Derrel Nip.

## 2018-08-22 ENCOUNTER — Telehealth: Payer: Self-pay

## 2018-08-22 DIAGNOSIS — N183 Chronic kidney disease, stage 3 unspecified: Secondary | ICD-10-CM | POA: Insufficient documentation

## 2018-08-22 DIAGNOSIS — R140 Abdominal distension (gaseous): Secondary | ICD-10-CM | POA: Insufficient documentation

## 2018-08-22 DIAGNOSIS — R944 Abnormal results of kidney function studies: Secondary | ICD-10-CM | POA: Insufficient documentation

## 2018-08-22 MED ORDER — ALPRAZOLAM 0.25 MG PO TABS: 0.2500 mg | ORAL_TABLET | Freq: Every day | ORAL | 5 refills | Status: DC | PRN

## 2018-08-22 NOTE — Assessment & Plan Note (Signed)
She has chronic back pain Complicated by scoliosis. She has persistent shoulder pain despite prior reconstruction.  celebrex has been stopped due to CKD and prednisone taper prescribed to manage recent increase in pain .She has not had any ER visits  And has not requested any early refills except today because [place] the [place] .  Her Refill history was confirmed via [place] Controlled Substance database by me today during her visit and there have been no prescriptions of controlled substances filled from any providers other than me. Marland Kitchen

## 2018-08-22 NOTE — Assessment & Plan Note (Addendum)
My be aggravated b celebrex use and dehydration.  Both have been addressed and repeat labs in 2 weeks ; nephrology referral made.  24 hour urine creatinine and renal ultrasound ordered

## 2018-08-22 NOTE — Telephone Encounter (Signed)
Refill sent to [place]

## 2018-08-22 NOTE — Assessment & Plan Note (Signed)
She has chronic back pain Complicated by scoliosis. She is unable to sit for prolonged periods of time without increased pain.  She uses a standing desk when at home. She continues to require 6 vicodin daily to manage her pain along with celebrex and tizanidine/   Refill history confirmed via [place] Controlled Substance database, accessed by me today..REfills for 3 months given .  Continue tizanidine for prn use

## 2018-08-22 NOTE — Assessment & Plan Note (Addendum)
She did not tolerate lexapro due to persistent fatigue after 4 days  of starting  At the low 5 mg dose...  Continue wellbutrin and buspirone . Alprazolam refilled for insomnia

## 2018-08-22 NOTE — Assessment & Plan Note (Signed)
Given her history of tylenol and alcohol use.  Will obtain an ultrasoumd of liver to rule out ascites

## 2018-08-22 NOTE — Telephone Encounter (Signed)
Which pharmacy should it go to ? [facility]?

## 2018-08-22 NOTE — Telephone Encounter (Signed)
She told me this morning [facility] [place]

## 2018-08-22 NOTE — Addendum Note (Signed)
Addended by: [name] on: 08/22/2018 11:36 AM   Modules accepted: Orders

## 2018-08-22 NOTE — Assessment & Plan Note (Signed)
Repeat assessment need as change appears to be due to dehydration

## 2018-08-22 NOTE — Assessment & Plan Note (Signed)
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

## 2018-08-22 NOTE — Telephone Encounter (Signed)
Spoke with pt this morning and she stated that she was able to pick up her medication yesterday afternoon.

## 2018-08-22 NOTE — Telephone Encounter (Signed)
Pt is requesting a refill of the Alprazolam 0.5mg . Pt stated that she takes 1/2 tablet daily.

## 2018-08-30 ENCOUNTER — Other Ambulatory Visit: Payer: Self-pay

## 2018-08-30 ENCOUNTER — Ambulatory Visit
Admission: RE | Admit: 2018-08-30 | Discharge: 2018-08-30 | Disposition: A | Discharge status: Home / Self Care | Payer: BC Managed Care – PPO | Source: Ambulatory Visit | Attending: Internal Medicine | Admitting: Internal Medicine

## 2018-08-30 DIAGNOSIS — N183 Chronic kidney disease, stage 3 unspecified: Secondary | ICD-10-CM

## 2018-08-30 DIAGNOSIS — I700 Atherosclerosis of aorta: Secondary | ICD-10-CM | POA: Diagnosis not present

## 2018-08-30 DIAGNOSIS — R140 Abdominal distension (gaseous): Secondary | ICD-10-CM | POA: Diagnosis not present

## 2018-09-01 ENCOUNTER — Other Ambulatory Visit: Payer: Self-pay | Admitting: Internal Medicine

## 2018-09-01 MED ORDER — PANTOPRAZOLE SODIUM 40 MG PO TBEC: 40.0000 mg | DELAYED_RELEASE_TABLET | Freq: Two times a day (BID) | ORAL | 1 refills | Status: DC

## 2018-09-12 ENCOUNTER — Other Ambulatory Visit: Payer: Self-pay | Admitting: Internal Medicine

## 2018-09-18 ENCOUNTER — Other Ambulatory Visit: Payer: Self-pay | Admitting: Internal Medicine

## 2018-09-18 MED ORDER — HYDROCODONE-ACETAMINOPHEN 7.5-325 MG PO TABS: 1.0000 | ORAL_TABLET | ORAL | 0 refills | Status: DC | PRN

## 2018-09-18 NOTE — Telephone Encounter (Signed)
Pt called and stated that she would like a call back from the nurse regarding medication HYDROcodone-acetaminophen ([place]) 7.5-325 MG tablet [[id number]]   Pt states that she only wants the nurse. Please advise

## 2018-09-18 NOTE — Telephone Encounter (Signed)
Refilled: 08/21/2018 Last OV: 08/21/2018 Next OV: not scheduled

## 2018-09-18 NOTE — Telephone Encounter (Signed)
Vicodin refilled at #180/month [facility] [place] [facility]  She received #40 extra last time  But I am not willing to do that again because of the excessive amount of tylenol she would be taking if she takes more than 6 daily

## 2018-09-23 ENCOUNTER — Telehealth: Payer: Self-pay

## 2018-09-23 MED ORDER — HYDROCODONE-ACETAMINOPHEN 7.5-325 MG PO TABS: 1.0000 | ORAL_TABLET | ORAL | 0 refills | Status: DC | PRN

## 2018-09-23 NOTE — Telephone Encounter (Signed)
Pt following up on this Rx for her   HYDROcodone-acetaminophen ([place]) 7.5-325 MG tablet  [id number] [place] [place] [phone] (Phone) [phone] (Fax)   Pt was only on vacation when this was requested to be sent last time.

## 2018-09-23 NOTE — Telephone Encounter (Signed)
Medication has been canceled and pt has been informed that [facility] [place].

## 2018-09-23 NOTE — Telephone Encounter (Signed)
Copied from [place] [id number]. Topic: General - Other >> Sep 23, 2018 11:22 AM Yvette Rack wrote: Reason for CRM: Pt stated she needs to speak with Janett Billow. Pt requests call back. Cb# [phone]

## 2018-09-23 NOTE — Telephone Encounter (Signed)
It has been  [facility] [place] [facility] . Please cal the [place] [facility] and cancel

## 2018-09-23 NOTE — Telephone Encounter (Signed)
Pt is requesting to have [facility]cod[facility] [place]. It was sent to the wrong pharmacy.

## 2018-09-25 ENCOUNTER — Other Ambulatory Visit: Payer: Self-pay | Admitting: Internal Medicine

## 2018-10-03 ENCOUNTER — Telehealth: Payer: Self-pay

## 2018-10-03 NOTE — Telephone Encounter (Signed)
Copied from [place] [id number]. Topic: General - Other >> Oct 03, 2018  1:54 PM Leward Quan A wrote: Reason for CRM: Patient called to a call back from [facility] Dr Derrel Nip nurse say its about insurance asking to be reached at Ph# [phone]

## 2018-10-03 NOTE — Telephone Encounter (Signed)
PA for Hydrocodone has been submitted and approved. Called pt to let her know. Pt gave a verbal understanding.

## 2018-10-07 ENCOUNTER — Other Ambulatory Visit: Payer: Self-pay | Admitting: Internal Medicine

## 2018-10-08 NOTE — Progress Notes (Deleted)
NEUROLOGY FOLLOW UP OFFICE NOTE  Adrienne Robinson [id number]  HISTORY OF PRESENT ILLNESS: ***.  Records and images were personally reviewed where available.  ***.  PAST MEDICAL HISTORY: Past Medical History:  Diagnosis Date  . Allergy   . Arthritis   . 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 B12 deficiency 01/2016  . Candida esophagitis ([place])   . Degenerative joint disease involving multiple joints   . Depression   . Dizziness   . Hypertension   . Pneumonia    approx 6 years ago  . Scoliosis   . Spinal stenosis of lumbar region   . Tubular adenoma of colon     MEDICATIONS: Current Outpatient Medications on File Prior to Visit  Medication Sig Dispense Refill  . amLODipine (NORVASC) 10 MG tablet TAKE 1 TABLET BY MOUTH EVERY DAY 90 tablet 1  . buPROPion (WELLBUTRIN XL) 150 MG 24 hr tablet TAKE 3 TABLETS (450 MG TOTAL) BY MOUTH DAILY. (Patient taking differently: Take 300 mg by mouth daily. ) 270 tablet 1  . busPIRone (BUSPAR) 10 MG tablet TAKE 1 TABLET BY MOUTH 2-3 TIMES DAILY FOR ANXIETY 270 tablet 1  . cyanocobalamin (,VITAMIN B-12,) 1000 MCG/ML injection Inject 1 mL (1,000 mcg total) into the muscle once a week. 10 mL 11  . fluticasone (FLONASE) 50 MCG/ACT nasal spray USE 2 SPRAYS IN EACH NOSTRIL DAILY AS NEEDED 16 g 1  . HYDROcodone-acetaminophen (NORCO) 7.5-325 MG tablet Take 1 tablet by mouth every 4 (four) hours as needed for moderate pain. 180 tablet 0  . metoprolol succinate (TOPROL-XL) 25 MG 24 hr tablet TAKE 1 TABLET BY MOUTH EVERY DAY (Patient not taking: Reported on 08/21/2018) 90 tablet 1  . pantoprazole (PROTONIX) 40 MG tablet TAKE 1 TABLET BY MOUTH TWICE A DAY 180 tablet 1  . predniSONE (DELTASONE) 10 MG tablet 6 tablets daily for 3 days   then reduce by 1 tablet daily until gone 33 tablet 0  . Syringe/Needle, Disp, (SYRINGE 3CC/25GX1") 25G X 1" 3 ML MISC Use for b12 injections 50 each 0  . zolpidem (AMBIEN) 10  MG tablet Take 1 tablet (10 mg total) by mouth at bedtime as needed. for sleep 30 tablet 5  . [DISCONTINUED] ferrous sulfate 325 (65 FE) MG EC tablet Take 1 tablet (325 mg total) by mouth daily after breakfast. 30 tablet 3  . [DISCONTINUED] metFORMIN (GLUCOPHAGE) 500 MG tablet Take 500 mg by mouth 2 (two) times daily with a meal.       Current Facility-Administered Medications on File Prior to Visit  Medication Dose Route Frequency Provider Last Rate Last Dose  . 0.9 %  sodium chloride infusion  500 mL Intravenous Continuous Pyrtle, Lajuan Lines, MD        ALLERGIES: Allergies  Allergen Reactions  . Morphine And Related Rash  . Adhesive [Tape] Rash and Other (See Comments)    Redness, blisters, skin peeling off.    FAMILY HISTORY: Family History  Problem Relation Age of Onset  . Colon cancer Father 17  . Diabetes Father   . Heart disease Father   . Rectal cancer Father   . Thyroid disease Mother   . Colon cancer Paternal Grandfather   . Colon cancer Other        pggm  . Breast cancer Paternal Grandmother   . Stomach cancer Neg Hx    ***.  SOCIAL HISTORY: Social History   Socioeconomic History  .  Marital status: Married    Spouse name: Not on file  . Number of children: 0  . Years of education: 49  . Highest education level: Not on file  Occupational History  . Occupation: Warden/ranger   Social Needs  . Financial resource strain: Not on file  . Food insecurity    Worry: Not on file    Inability: Not on file  . Transportation needs    Medical: Not on file    Non-medical: Not on file  Tobacco Use  . Smoking status: Never Smoker  . Smokeless tobacco: Never Used  Substance and Sexual Activity  . Alcohol use: Yes    Alcohol/week: 2.0 standard drinks    Types: 2 Glasses of wine per week    Comment: 2-3 week with dinner  . Drug use: No  . Sexual activity: Not on file  Lifestyle  . Physical activity    Days per week: Not on file    Minutes per session: Not on file   . Stress: Not on file  Relationships  . Social Herbalist on phone: Not on file    Gets together: Not on file    Attends religious service: Not on file    Active member of club or organization: Not on file    Attends meetings of clubs or organizations: Not on file    Relationship status: Not on file  . Intimate partner violence    Fear of current or ex partner: Not on file    Emotionally abused: Not on file    Physically abused: Not on file    Forced sexual activity: Not on file  Other Topics Concern  . Not on file  Social History Narrative   Lives at home with her husband.   Right-handed.   2-3 diet cokes per day.    REVIEW OF SYSTEMS: Constitutional: No fevers, chills, or sweats, no generalized fatigue, change in appetite Eyes: No visual changes, double vision, eye pain Ear, nose and throat: No hearing loss, ear pain, nasal congestion, sore throat Cardiovascular: No chest pain, palpitations Respiratory:  No shortness of breath at rest or with exertion, wheezes GastrointestinaI: No nausea, vomiting, diarrhea, abdominal pain, fecal incontinence Genitourinary:  No dysuria, urinary retention or frequency Musculoskeletal:  No neck pain, back pain Integumentary: No rash, pruritus, skin lesions Neurological: as above Psychiatric: No depression, insomnia, anxiety Endocrine: No palpitations, fatigue, diaphoresis, mood swings, change in appetite, change in weight, increased thirst Hematologic/Lymphatic:  No purpura, petechiae. Allergic/Immunologic: no itchy/runny eyes, nasal congestion, recent allergic reactions, rashes  PHYSICAL EXAM: *** General: No acute distress.  Patient appears ***-groomed.   Head:  Normocephalic/atraumatic Eyes:  Fundi examined but not visualized Neck: supple, no paraspinal tenderness, full range of motion Heart:  Regular rate and rhythm Lungs:  Clear to auscultation bilaterally Back: No paraspinal tenderness Neurological Exam: alert and  oriented to person, place, and time. Attention span and concentration intact, recent and remote memory intact, fund of knowledge intact.  Speech fluent and not dysarthric, language intact.  CN II-XII intact. Bulk and tone normal, muscle strength 5/5 throughout.  Sensation to light touch, temperature and vibration intact.  Deep tendon reflexes 2+ throughout, toes downgoing.  Finger to nose and heel to shin testing intact.  Gait normal, Romberg negative.  IMPRESSION: ***  PLAN: ***  Metta Clines, DO  CC: ***

## 2018-10-10 ENCOUNTER — Other Ambulatory Visit: Payer: Self-pay

## 2018-10-10 ENCOUNTER — Telehealth: Payer: BC Managed Care – PPO | Admitting: Neurology

## 2018-10-12 ENCOUNTER — Other Ambulatory Visit: Payer: Self-pay | Admitting: Internal Medicine

## 2018-10-14 NOTE — Telephone Encounter (Signed)
Refilled: 04/06/2018 Last OV: 08/21/2018 Next OV: 11/14/2018

## 2018-10-18 ENCOUNTER — Telehealth: Payer: Self-pay | Admitting: Internal Medicine

## 2018-10-18 MED ORDER — HYDROCODONE-ACETAMINOPHEN 7.5-325 MG PO TABS: 1.0000 | ORAL_TABLET | ORAL | 0 refills | Status: DC | PRN

## 2018-10-18 NOTE — Telephone Encounter (Signed)
°  Relation to pt: self  Call back number: [phone]    Reason for call:  Patient requesting to speak with "Jessica" Dr. Derrel Nip nurse regarding medication refill (patient didn't want to elaborate and requested to speak with nurse, please advise

## 2018-10-18 NOTE — Telephone Encounter (Signed)
Refilled: 09/23/2018 Last OV: 08/21/2018 Next OV: 11/14/2018

## 2018-10-22 ENCOUNTER — Telehealth: Payer: BC Managed Care – PPO | Admitting: Neurology

## 2018-10-25 ENCOUNTER — Encounter: Payer: Self-pay | Admitting: *Deleted

## 2018-10-25 ENCOUNTER — Encounter: Payer: Self-pay | Admitting: Family Medicine

## 2018-10-25 ENCOUNTER — Telehealth: Payer: Self-pay | Admitting: *Deleted

## 2018-10-25 NOTE — Telephone Encounter (Signed)
Discussed with pt via mychart.

## 2018-10-25 NOTE — Telephone Encounter (Signed)
Pt left msg stating that she is in a lot of pain and believe she is having a spasm in her neck/shoulder. Pt is currently scheduled to see you on 9/9 and would like to know if there is anything she can try to at home to get her by between now and then. She stated she has been applying both heat & ice.

## 2018-10-28 NOTE — Progress Notes (Addendum)
[facility] [place], [place] [address] Phone: [phone] Subjective:   I [name] am serving as a Education administrator for Dr. Hulan Saas.   CC: Neck pain follow-up  [id number]  DYLYN Adrienne Robinson is a 63 y.o. female coming in with complaint of neck left thumb pain. States her neck is in constant spasm. Left sided and posterior neck pain.   Onset- Chronic  Location more left side of the neck-  Duration-  Character-dull, throbbing aching pain that is severe and worse with any type of movement with the neck Aggravating factors- Reliving factors-  Therapies tried- heat, ice, oral medications, can't take ibuprofen  Severity-5 out of 10      Past Medical History:  Diagnosis Date  . Allergy   . Arthritis   . 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 B12 deficiency 01/2016  . Candida esophagitis ([place])   . Degenerative joint disease involving multiple joints   . Depression   . Dizziness   . Hypertension   . Pneumonia    approx 6 years ago  . Scoliosis   . Spinal stenosis of lumbar region   . Tubular adenoma of colon    Past Surgical History:  Procedure Laterality Date  . ABDOMINAL HYSTERECTOMY    . APPENDECTOMY    . CARDIAC CATHETERIZATION     normal coronaries, no wall motion abnormalities 11/02/09 [facility])  . NASAL SINUS SURGERY    . OOPHORECTOMY    . RADIOLOGY WITH ANESTHESIA  02/26/2012   Procedure: RADIOLOGY WITH ANESTHESIA;  Surgeon: [name], MD;  Location: [facility];  Service: Radiology;  Laterality: N/A;  . REVERSE SHOULDER ARTHROPLASTY Right 06/27/2013   Procedure: REVERSE RIGHT TOTAL SHOULDER ARTHROPLASTY;  Surgeon: [name], MD;  Location: [facility];  Service: Orthopedics;  Laterality: Right;  . SHOULDER ARTHROSCOPY WITH ROTATOR CUFF REPAIR AND SUBACROMIAL DECOMPRESSION  2007   left  . TONSILLECTOMY  adnoids   Social History   Socioeconomic History  . Marital  status: Married    Spouse name: Not on file  . Number of children: 0  . Years of education: 72  . Highest education level: Not on file  Occupational History  . Occupation: Warden/ranger   Social Needs  . Financial resource strain: Not on file  . Food insecurity    Worry: Not on file    Inability: Not on file  . Transportation needs    Medical: Not on file    Non-medical: Not on file  Tobacco Use  . Smoking status: Never Smoker  . Smokeless tobacco: Never Used  Substance and Sexual Activity  . Alcohol use: Yes    Alcohol/week: 2.0 standard drinks    Types: 2 Glasses of wine per week    Comment: 2-3 week with dinner  . Drug use: No  . Sexual activity: Not on file  Lifestyle  . Physical activity    Days per week: Not on file    Minutes per session: Not on file  . Stress: Not on file  Relationships  . Social Herbalist on phone: Not on file    Gets together: Not on file    Attends religious service: Not on file    Active member of club or organization: Not on file    Attends meetings of clubs or organizations: Not on file    Relationship status: Not on  file  Other Topics Concern  . Not on file  Social History Narrative   Lives at home with her husband.   Right-handed.   2-3 diet cokes per day.   Allergies  Allergen Reactions  . Morphine And Related Rash  . Adhesive [Tape] Rash and Other (See Comments)    Redness, blisters, skin peeling off.   Family History  Problem Relation Age of Onset  . Colon cancer Father 10  . Diabetes Father   . Heart disease Father   . Rectal cancer Father   . Thyroid disease Mother   . Colon cancer Paternal Grandfather   . Colon cancer Other        pggm  . Breast cancer Paternal Grandmother   . Stomach cancer Neg Hx       Current Outpatient Medications (Cardiovascular):  .  amLODipine (NORVASC) 10 MG tablet, TAKE 1 TABLET BY MOUTH EVERY DAY .  metoprolol succinate (TOPROL-XL) 25 MG 24 hr tablet, TAKE 1 TABLET BY  MOUTH EVERY DAY   Current Outpatient Medications (Respiratory):  .  fluticasone (FLONASE) 50 MCG/ACT nasal spray, USE 2 SPRAYS IN EACH NOSTRIL DAILY AS NEEDED   Current Outpatient Medications (Analgesics):  .  HYDROcodone-acetaminophen (NORCO) 7.5-325 MG tablet, Take 1 tablet by mouth every 4 (four) hours as needed for moderate pain.   Current Outpatient Medications (Hematological):  .  cyanocobalamin (,VITAMIN B-12,) 1000 MCG/ML injection, Inject 1 mL (1,000 mcg total) into the muscle once a week.   Current Outpatient Medications (Other):  Marland Kitchen  buPROPion (WELLBUTRIN XL) 150 MG 24 hr tablet, TAKE 3 TABLETS (450 MG TOTAL) BY MOUTH DAILY. (Patient taking differently: Take 300 mg by mouth daily. ) .  pantoprazole (PROTONIX) 40 MG tablet, TAKE 1 TABLET BY MOUTH TWICE A DAY .  Syringe/Needle, Disp, (SYRINGE 3CC/25GX1") 25G X 1" 3 ML MISC, Use for b12 injections .  zolpidem (AMBIEN) 10 MG tablet, TAKE 1 TABLET (10 MG TOTAL) BY MOUTH AT BEDTIME AS NEEDED. FOR SLEEP .  methocarbamol (ROBAXIN) 500 MG tablet, Take 1 tablet (500 mg total) by mouth 2 (two) times daily.  Current Facility-Administered Medications (Other):  .  0.9 %  sodium chloride infusion    Past medical history, social, surgical and family history all reviewed in electronic medical record.  No pertanent information unless stated regarding to the chief complaint.   Review of Systems:  No headache, visual changes, nausea, vomiting, diarrhea, constipation, dizziness, abdominal pain, skin rash, fevers, chills, night sweats, weight loss, swollen lymph nodes,  chest pain, shortness of breath, mood changes.  Positive muscle aches, body aches  Objective  Blood pressure 100/70, pulse 62, height 5\' 6"  (1.676 m), weight 125 lb (56.7 kg), SpO2 98 %.    General: No apparent distress alert and oriented x3 mood and affect normal, dressed appropriately.  HEENT: Pupils equal, extraocular movements intact  Respiratory: Patient's speak in full  sentences and does not appear short of breath  Cardiovascular: No lower extremity edema, non tender, no erythema  Skin: Warm dry intact with no signs of infection or rash on extremities or on axial skeleton.  Abdomen: Soft nontender  Neuro: Cranial nerves II through XII are intact, neurovascularly intact in all extremities with 2+ DTRs and 2+ pulses.  Lymph: No lymphadenopathy of posterior or anterior cervical chain or axillae bilaterally.  Gait normal with good balance and coordination.  MSK:  tender with limited range of motion and good stability and symmetric strength and tone of shoulders, elbows, wrist,  hip, knee and ankles bilaterally.  Moderate to severe arthritic changes of multiple joints Neck: Inspection loss of lordosis. No palpable stepoffs. Negative Spurling's maneuver. Mild limited range of motion lacking last 5 degrees of sidebending bilaterally Grip strength and sensation normal in bilateral hands Strength good C4 to T1 distribution No sensory change to C4 to T1 Negative Hoffman sign bilaterally Reflexes normal Tightness in the trapezius bilaterally   CMC joint does have significant arthritic changes with a positive grind test.  Mild arthritic changes along the rest of the joints of the hands  After verbal consent patient was prepped with alcohol swabs and with a 25-gauge half inch needle injected and 4 distinct trigger points in the left shoulder region including the trapezius, rhomboids and deltoid. .  Total of 3 cc of 0.5% Marcaine and 1 cc of Kenalog 40 mg/mL used  Procedure: Real-time Ultrasound Guided Injection of left CMC joint Device: GE Logiq Q7 Ultrasound guided injection is preferred based studies that show increased duration, increased effect, greater accuracy, decreased procedural pain, increased response rate, and decreased cost with ultrasound guided versus blind injection.  Verbal informed consent obtained.  Time-out conducted.  Noted no overlying  erythema, induration, or other signs of local infection.  Skin prepped in a sterile fashion.  Local anesthesia: Topical Ethyl chloride.  With sterile technique and under real time ultrasound guidance: With a 25-gauge half inch needle injected with 0.5 cc of 0.5% Marcaine and 0.5 cc of Kenalog 40 mg/mL Completed without difficulty  Pain immediately resolved suggesting accurate placement of the medication.  Advised to call if fevers/chills, erythema, induration, drainage, or persistent bleeding.  Images permanently stored and available for review in the ultrasound unit.  Impression: Technically successful ultrasound guided injection.   Impression and Recommendations:     This case required medical decision making of moderate complexity. The above documentation has been reviewed and is accurate and complete Lyndal Pulley, DO       Note: This dictation was prepared with Dragon dictation along with smaller phrase technology. Any transcriptional errors that result from this process are unintentional.

## 2018-10-29 ENCOUNTER — Ambulatory Visit: Payer: BC Managed Care – PPO | Admitting: Family Medicine

## 2018-10-29 ENCOUNTER — Ambulatory Visit (INDEPENDENT_AMBULATORY_CARE_PROVIDER_SITE_OTHER)
Admission: RE | Admit: 2018-10-29 | Discharge: 2018-10-29 | Disposition: A | Discharge status: Home / Self Care | Payer: BC Managed Care – PPO | Source: Ambulatory Visit | Attending: Family Medicine | Admitting: Family Medicine

## 2018-10-29 ENCOUNTER — Encounter: Payer: Self-pay | Admitting: Family Medicine

## 2018-10-29 ENCOUNTER — Other Ambulatory Visit: Payer: Self-pay

## 2018-10-29 ENCOUNTER — Ambulatory Visit: Payer: Self-pay

## 2018-10-29 VITALS — BP 100/70 | HR 62 | Ht 66.0 in | Wt 125.0 lb

## 2018-10-29 DIAGNOSIS — M25512 Pain in left shoulder: Secondary | ICD-10-CM | POA: Diagnosis not present

## 2018-10-29 DIAGNOSIS — M5090 Cervical disc disorder, unspecified, unspecified cervical region: Secondary | ICD-10-CM | POA: Diagnosis not present

## 2018-10-29 DIAGNOSIS — G8929 Other chronic pain: Secondary | ICD-10-CM | POA: Diagnosis not present

## 2018-10-29 DIAGNOSIS — M542 Cervicalgia: Secondary | ICD-10-CM | POA: Diagnosis not present

## 2018-10-29 DIAGNOSIS — M79642 Pain in left hand: Secondary | ICD-10-CM

## 2018-10-29 DIAGNOSIS — M4802 Spinal stenosis, cervical region: Secondary | ICD-10-CM | POA: Diagnosis not present

## 2018-10-29 DIAGNOSIS — M1812 Unilateral primary osteoarthritis of first carpometacarpal joint, left hand: Secondary | ICD-10-CM | POA: Diagnosis not present

## 2018-10-29 DIAGNOSIS — M4312 Spondylolisthesis, cervical region: Secondary | ICD-10-CM | POA: Diagnosis not present

## 2018-10-29 MED ORDER — METHOCARBAMOL 500 MG PO TABS: 500.0000 mg | ORAL_TABLET | Freq: Two times a day (BID) | ORAL | 1 refills | Status: DC

## 2018-10-29 NOTE — Assessment & Plan Note (Signed)
Trigger points given injections today.  Tolerated well.  Could be in differential include cervical radiculopathy and will continue to monitor.

## 2018-10-29 NOTE — Patient Instructions (Signed)
See me again in 3 weeks 

## 2018-10-29 NOTE — Assessment & Plan Note (Signed)
Known history of arthritic changes in the neck.  Patient could be having some radicular symptoms.  Seem to have more trigger points though in the shoulder region today and given injections.  Hope that this will be somewhat beneficial.  Due to patient's other medications will change patient's muscle relaxer and see if this will be beneficial.  Discussed with patient about icing regimen and home exercise, which activities to do which wants to avoid.  Follow-up 4 to 8 weeks

## 2018-10-29 NOTE — Assessment & Plan Note (Signed)
Repeat injection given again today.  Tolerated the procedure well.  Discussed posture and ergonomics, which activities to do which wants to avoid.  Patient is to increase activity slowly over the course the next several weeks.  We discussed bracing again at night.  Patient wants to avoid any surgical intervention.  Discussed the possibility of PRP.  Follow-up again in 4 to 8 weeks

## 2018-10-30 ENCOUNTER — Ambulatory Visit: Payer: BC Managed Care – PPO | Admitting: Family Medicine

## 2018-10-31 ENCOUNTER — Encounter: Payer: Self-pay | Admitting: Family Medicine

## 2018-11-01 MED ORDER — PREDNISONE 50 MG PO TABS: 50.0000 mg | ORAL_TABLET | Freq: Every day | ORAL | 0 refills | Status: DC

## 2018-11-06 ENCOUNTER — Encounter: Payer: Self-pay | Admitting: Family Medicine

## 2018-11-07 ENCOUNTER — Encounter: Payer: Self-pay | Admitting: Family Medicine

## 2018-11-07 MED ORDER — PREDNISONE 50 MG PO TABS: 50.0000 mg | ORAL_TABLET | Freq: Every day | ORAL | 0 refills | Status: DC

## 2018-11-14 ENCOUNTER — Encounter: Payer: Self-pay | Admitting: Internal Medicine

## 2018-11-14 ENCOUNTER — Ambulatory Visit: Payer: BC Managed Care – PPO | Admitting: Internal Medicine

## 2018-11-14 ENCOUNTER — Other Ambulatory Visit: Payer: Self-pay

## 2018-11-14 VITALS — BP 130/90 | HR 69 | Temp 97.3°F | Resp 14 | Ht 66.0 in | Wt 125.6 lb

## 2018-11-14 DIAGNOSIS — D708 Other neutropenia: Secondary | ICD-10-CM

## 2018-11-14 DIAGNOSIS — I729 Aneurysm of unspecified site: Secondary | ICD-10-CM

## 2018-11-14 DIAGNOSIS — I10 Essential (primary) hypertension: Secondary | ICD-10-CM | POA: Diagnosis not present

## 2018-11-14 DIAGNOSIS — N183 Chronic kidney disease, stage 3 unspecified: Secondary | ICD-10-CM

## 2018-11-14 DIAGNOSIS — M064 Inflammatory polyarthropathy: Secondary | ICD-10-CM

## 2018-11-14 DIAGNOSIS — Z23 Encounter for immunization: Secondary | ICD-10-CM

## 2018-11-14 DIAGNOSIS — F418 Other specified anxiety disorders: Secondary | ICD-10-CM

## 2018-11-14 DIAGNOSIS — Z1239 Encounter for other screening for malignant neoplasm of breast: Secondary | ICD-10-CM

## 2018-11-14 DIAGNOSIS — G894 Chronic pain syndrome: Secondary | ICD-10-CM

## 2018-11-14 LAB — LIPID PANEL
Cholesterol: 219 mg/dL — ABNORMAL HIGH (ref 0–200)
HDL: 87.4 mg/dL (ref >39.00)
LDL Cholesterol: 102 mg/dL — ABNORMAL HIGH (ref 0–99)
NonHDL: 131.81
Total CHOL/HDL Ratio: 3
Triglycerides: 150 mg/dL — ABNORMAL HIGH (ref 0.0–149.0)
VLDL: 30 mg/dL (ref 0.0–40.0)

## 2018-11-14 LAB — COMPREHENSIVE METABOLIC PANEL
ALT: 12 U/L (ref 0–35)
AST: 9 U/L (ref 0–37)
Albumin: 4.4 g/dL (ref 3.5–5.2)
Alkaline Phosphatase: 55 U/L (ref 39–117)
BUN: 20 mg/dL (ref 6–23)
CO2: 28 mEq/L (ref 19–32)
Calcium: 9.4 mg/dL (ref 8.4–10.5)
Chloride: 101 mEq/L (ref 96–112)
Creatinine, Ser: 0.92 mg/dL (ref 0.40–1.20)
GFR: 61.68 mL/min (ref >60.00)
Glucose, Bld: 90 mg/dL (ref 70–99)
Potassium: 3.6 mEq/L (ref 3.5–5.1)
Sodium: 137 mEq/L (ref 135–145)
Total Bilirubin: 0.5 mg/dL (ref 0.2–1.2)
Total Protein: 6.5 g/dL (ref 6.0–8.3)

## 2018-11-14 LAB — VITAMIN D 25 HYDROXY (VIT D DEFICIENCY, FRACTURES): VITD: 25.18 ng/mL — ABNORMAL LOW (ref 30.00–100.00)

## 2018-11-14 LAB — MICROALBUMIN / CREATININE URINE RATIO
Creatinine,U: 73.5 mg/dL
Microalb Creat Ratio: 1 mg/g (ref 0.0–30.0)
Microalb, Ur: <0.7 mg/dL (ref 0.0–1.9)

## 2018-11-14 MED ORDER — ALPRAZOLAM 0.25 MG PO TABS: 0.2500 mg | ORAL_TABLET | Freq: Every day | ORAL | 0 refills | Status: AC | PRN

## 2018-11-14 MED ORDER — HYDROCODONE-ACETAMINOPHEN 7.5-325 MG PO TABS: 1.0000 | ORAL_TABLET | ORAL | 0 refills | Status: DC | PRN

## 2018-11-14 NOTE — Assessment & Plan Note (Addendum)
She has chronic back pain Complicated by scoliosis. She has persistent shoulder pain despite prior reconstruction.  celebrex has been stopped due to CKD and prednisone taper used in the recent past to  manage recent increase in neck pain.   .She has not had any ER visits. Her Refill history was confirmed via [place] Controlled Substance database by me today during her visit and there have been no prescriptions of controlled substances filled from any providers other than me. .Refills for hydrocodone have been printed and given to patient today due to upcoming travel  For sept 29, Oct 29 and Nov 28.

## 2018-11-14 NOTE — Assessment & Plan Note (Addendum)
Not at goal on maximal doses of amlodipine and metoprolol .Adrienne Robinson  She had a decline in GFR during lisinopril trial but had a renal artery doppler US that was normal in 2018. She has no fluid retention,  Will consider adding hydralazine

## 2018-11-14 NOTE — Patient Instructions (Signed)
Chronic Kidney Disease, Adult Chronic kidney disease (CKD) occurs when the kidneys become damaged slowly over a long period of time. The kidneys are a pair of organs that do many important jobs in the body, including:  Removing waste and extra fluid from the blood to make urine.  Making hormones that maintain the amount of fluid in tissues and blood vessels.  Maintaining the right amount of fluids and chemicals in the body. A small amount of kidney damage may not cause problems, but a large amount of damage may make it hard or impossible for the kidneys to work the way they should. If steps are not taken to slow down kidney damage or to stop it from getting worse, the kidneys may stop working permanently (end-stage renal disease or ESRD). Most of the time, CKD does not go away, but it can often be controlled. People who have CKD are usually able to live normal lives. What are the causes? The most common causes of this condition are diabetes and high blood pressure (hypertension). Other causes include:  Heart and blood vessel (cardiovascular) disease.  Kidney diseases, such as: ? Glomerulonephritis. ? Interstitial nephritis. ? Polycystic kidney disease. ? Renal vascular disease.  Diseases that affect the immune system.  Genetic diseases.  Medicines that damage the kidneys, such as anti-inflammatory medicines.  Being around or being in contact with poisonous (toxic) substances.  A kidney or urinary infection that occurs again and again (recurs).  Vasculitis. This is swelling or inflammation of the blood vessels.  A problem with urine flow that may be caused by: ? Cancer. ? Having kidney stones more than one time. ? An enlarged prostate, in males. What increases the risk? You are more likely to develop this condition if you:  Are older than age 60.  Are female.  Are African-American, Hispanic, Asian, Pacific Islander, or American Indian.  Are a current or former smoker.   Are obese.  Have a family history of kidney disease or failure.  Often take medicines that are damaging to the kidneys. What are the signs or symptoms? Symptoms of this condition include:  Swelling (edema) of the face, legs, ankles, or feet.  Tiredness (lethargy) and having less energy.  Nausea or vomiting.  Confusion or trouble concentrating.  Problems with urination, such as: ? Painful or burning feeling during urination. ? Decreased urine production. ? Frequent urination, especially at night. ? Bloody urine.  Muscle twitches and cramps, especially in the legs.  Shortness of breath.  Weakness.  Loss of appetite.  Metallic taste in the mouth.  Trouble sleeping.  Dry, itchy skin.  A low blood count (anemia).  Pale lining of the eyelids and surface of the eye (conjunctiva). Symptoms develop slowly and may not be obvious until the kidney damage becomes severe. It is possible to have kidney disease for years without having any symptoms. How is this diagnosed? This condition may be diagnosed based on:  Blood tests.  Urine tests.  Imaging tests, such as an ultrasound or CT scan.  A test in which a sample of tissue is removed from the kidneys to be examined under a microscope (kidney biopsy). These test results will help your health care provider determine how serious the CKD is. How is this treated? There is no cure for most cases of this condition, but treatment usually relieves symptoms and prevents or slows the progression of the disease. Treatment may include:  Making diet changes, which may require you to avoid alcohol, salty foods (sodium),   and foods that are high in potassium, calcium, and protein.  Medicines: ? To lower blood pressure. ? To control blood glucose. ? To relieve anemia. ? To relieve swelling. ? To protect your bones. ? To improve the balance of electrolytes in your blood.  Removing toxic waste from the body through types of dialysis, if  the kidneys can no longer do their job (kidney failure).  Managing any other conditions that are causing your CKD or making it worse. Follow these instructions at home: Medicines  Take over-the-counter and prescription medicines only as told by your health care provider. The dose of some medicines that you take may need to be adjusted.  Do not take any new medicines unless approved by your health care provider. Many medicines can worsen your kidney damage.  Do not take any vitamin and mineral supplements unless approved by your health care provider. Many nutritional supplements can worsen your kidney damage. General instructions  Follow your prescribed diet as told by your health care provider.  Do not use any products that contain nicotine or tobacco, such as cigarettes and e-cigarettes. If you need help quitting, ask your health care provider.  Monitor and track your blood pressure at home. Report changes in your blood pressure as told by your health care provider.  If you are being treated for diabetes, monitor and track your blood sugar (blood glucose) levels as told by your health care provider.  Maintain a healthy weight. If you need help with this, ask your health care provider.  Start or continue an exercise plan. Exercise at least 30 minutes a day, 5 days a week.  Keep your immunizations up to date as told by your health care provider.  Keep all follow-up visits as told by your health care provider. This is important. Where to find more information  American Association of Kidney Patients: www.aakp.org  National Kidney Foundation: www.kidney.org  American Kidney Fund: www.akfinc.org  [facility]: www.lifeoptions.org and www.kidneyschool.org Contact a health care provider if:  Your symptoms get worse.  You develop new symptoms. Get help right away if:  You develop symptoms of ESRD, which include: ? Headaches. ? Numbness in the hands or  feet. ? Easy bruising. ? Frequent hiccups. ? Chest pain. ? Shortness of breath. ? Lack of menstruation, in women.  You have a fever.  You have decreased urine production.  You have pain or bleeding when you urinate. Summary  Chronic kidney disease (CKD) occurs when the kidneys become damaged slowly over a long period of time.  The most common causes of this condition are diabetes and high blood pressure (hypertension).  There is no cure for most cases of this condition, but treatment usually relieves symptoms and prevents or slows the progression of the disease. Treatment may include a combination of medicines and lifestyle changes. This information is not intended to replace advice given to you by your health care provider. Make sure you discuss any questions you have with your health care provider. Document Released: 11/16/2007 Document Revised: 01/19/2017 Document Reviewed: 03/16/2016 Elsevier Patient Education  2020 Elsevier Inc.  

## 2018-11-14 NOTE — Assessment & Plan Note (Signed)
Improved with decreased wellbutrin dose,  New job opportunities.  Using ambien and alprazolam prn insomnia The risks and benefits of benzodiazepine use were discussed with patient today including excessive sedation leading to respiratory depression,  impaired thinking/driving, and addiction.  Patient was advised to avoid concurrent use with alcohol, to use medication only as needed and not to share with others  .

## 2018-11-14 NOTE — Assessment & Plan Note (Signed)
With small left kidney by recent U/S, suggestive of decreased output,  NEPHROLOGY consult pending

## 2018-11-14 NOTE — Addendum Note (Signed)
Addended by: Leeanne Rio on: 11/14/2018 08:45 AM   Modules accepted: Orders

## 2018-11-14 NOTE — Progress Notes (Signed)
Subjective:  Patient ID: Adrienne Robinson, female    DOB: 02-26-1955  Age: 63 y.o. MRN: [id number]  CC: The primary encounter diagnosis was Chronic kidney disease (CKD), stage III (moderate) ([place]). Diagnoses of Breast cancer screening, Chronic kidney disease, stage 3, mod decreased GFR (HCC), Need for immunization against influenza, Essential hypertension, Anxiety associated with depression, Chronic pain syndrome, Other neutropenia ([place]), Inflammatory polyarthropathy ([place]), and Aneurysm ([place]) were also pertinent to this visit.  HPI Adrienne Robinson presents for follow up on hypertension, chronic pain,  And anxiety,  With  medication refill on opioids and benzodiazepines.  Last seen July 1 Refill history confirmed via [place] Controlled Substance databas, accessed by me today.Durene Cal for abd [place] on July 10 for evaluation of bloating . No gallstones,  Pericholecystic fluid or ascites. Normal Liver  pattern.  Left kidney smaller than right, aortic atherosclerosis   Nephrology referral made to [facility] on July 14: appt scheduled with Joanne Chars.   Last mammog 2018 .  Done at [facility]   Neck pain:  Seeing Charlann Boxer:  Neck exercises and prednisone 2nd taper helping . Also had several  injections from [place] .    Insomnia:  Using ambien 2/ week,  And alprazolam at  1/2  Tablet on alternate days  never with ambien.  Chronic MSK pain: using hydrocodone  On average 6 times daily ,  . . HTN: home readings still elevated diastolic.   Prior hypotension with minimal dose of ace inhibitor  GAD:  Aggravated by unemployed status.  She has  lowered her dose of wellbutrin to 300 mg for the last 3 months due to palpitations and feels better,  Has had no palpitations since doing so.     Outpatient Medications Prior to Visit  Medication Sig Dispense Refill   amLODipine (NORVASC) 10 MG tablet TAKE 1 TABLET BY MOUTH EVERY DAY 90 tablet 1   buPROPion (WELLBUTRIN XL) 150 MG 24 hr tablet TAKE 3 TABLETS (450 MG  TOTAL) BY MOUTH DAILY. (Patient taking differently: Take 300 mg by mouth daily. ) 270 tablet 1   cyanocobalamin (,VITAMIN B-12,) 1000 MCG/ML injection Inject 1 mL (1,000 mcg total) into the muscle once a week. 10 mL 11   fluticasone (FLONASE) 50 MCG/ACT nasal spray USE 2 SPRAYS IN EACH NOSTRIL DAILY AS NEEDED 16 g 1   methocarbamol (ROBAXIN) 500 MG tablet Take 1 tablet (500 mg total) by mouth 2 (two) times daily. 60 tablet 1   metoprolol succinate (TOPROL-XL) 25 MG 24 hr tablet TAKE 1 TABLET BY MOUTH EVERY DAY 90 tablet 1   omeprazole (PRILOSEC) 40 MG capsule Take 40 mg by mouth 2 (two) times daily.     pantoprazole (PROTONIX) 40 MG tablet TAKE 1 TABLET BY MOUTH TWICE A DAY 180 tablet 1   Syringe/Needle, Disp, (SYRINGE 3CC/25GX1") 25G X 1" 3 ML MISC Use for b12 injections 50 each 0   zolpidem (AMBIEN) 10 MG tablet TAKE 1 TABLET (10 MG TOTAL) BY MOUTH AT BEDTIME AS NEEDED. FOR SLEEP 30 tablet 2   HYDROcodone-acetaminophen (NORCO) 7.5-325 MG tablet Take 1 tablet by mouth every 4 (four) hours as needed for moderate pain. 180 tablet 0   predniSONE (DELTASONE) 50 MG tablet Take 1 tablet (50 mg total) by mouth daily. 5 tablet 0   predniSONE (DELTASONE) 50 MG tablet Take 1 tablet (50 mg total) by mouth daily. (Patient not taking: Reported on 11/14/2018) 5 tablet 0   Facility-Administered Medications Prior to Visit  Medication Dose Route Frequency Provider Last Rate Last Dose   0.9 %  sodium chloride infusion  500 mL Intravenous Continuous Pyrtle, Lajuan Lines, MD        Review of Systems;  Patient denies headache, fevers, malaise, unintentional weight loss, skin rash, eye pain, sinus congestion and sinus pain, sore throat, dysphagia,  hemoptysis , cough, dyspnea, wheezing, chest pain, palpitations, orthopnea, edema, abdominal pain, nausea, melena, diarrhea, constipation, flank pain, dysuria, hematuria, urinary  Frequency, nocturia, numbness, tingling, seizures,  Focal weakness, Loss of  consciousness,  Tremor, insomnia, depression, anxiety, and suicidal ideation.      Objective:  BP 130/90 (BP Location: Left Arm, Patient Position: Sitting, Cuff Size: Normal)    Pulse 69    Temp (!) 97.3 F (36.3 C) (Temporal)    Resp 14    Ht 5\' 6"  (1.676 m)    Wt 125 lb 9.6 oz (57 kg)    SpO2 99%    BMI 20.27 kg/m   BP Readings from Last 3 Encounters:  11/14/18 130/90  10/29/18 100/70  01/25/18 140/76    Wt Readings from Last 3 Encounters:  11/14/18 125 lb 9.6 oz (57 kg)  10/29/18 125 lb (56.7 kg)  01/25/18 121 lb 12.8 oz (55.2 kg)    General appearance: alert, cooperative and appears stated age Ears: normal TM's and external ear canals both ears Throat: lips, mucosa, and tongue normal; teeth and gums normal Neck: no adenopathy, no carotid bruit, supple, symmetrical, trachea midline and thyroid not enlarged, symmetric, no tenderness/mass/nodules Back: symmetric, no curvature. ROM normal. No CVA tenderness. Lungs: clear to auscultation bilaterally Heart: regular rate and rhythm, S1, S2 normal, no murmur, click, rub or gallop Abdomen: soft, non-tender; bowel sounds normal; no masses,  no organomegaly Pulses: 2+ and symmetric Skin: Skin color, texture, turgor normal. No rashes or lesions Lymph nodes: Cervical, supraclavicular, and axillary nodes normal.  Lab Results  Component Value Date   HGBA1C 5.7 08/04/2011    Lab Results  Component Value Date   CREATININE 0.92 11/14/2018   CREATININE 1.48 (H) 08/01/2018   CREATININE 1.07 09/24/2017    Lab Results  Component Value Date   WBC 5.4 08/01/2018   HGB 10.8 (L) 08/01/2018   HCT 31.5 (L) 08/01/2018   PLT 345.0 08/01/2018   GLUCOSE 90 11/14/2018   CHOL 219 (H) 11/14/2018   TRIG 150.0 (H) 11/14/2018   HDL 87.40 11/14/2018   LDLDIRECT 160.2 04/11/2013   LDLCALC 102 (H) 11/14/2018   ALT 12 11/14/2018   AST 9 11/14/2018   NA 137 11/14/2018   K 3.6 11/14/2018   CL 101 11/14/2018   CREATININE 0.92 11/14/2018   BUN  20 11/14/2018   CO2 28 11/14/2018   TSH 1.31 08/01/2018   INR 0.94 03/04/2015   HGBA1C 5.7 08/04/2011   MICROALBUR <0.7 11/14/2018    Dg Cervical Spine Complete  Result Date: 10/29/2018 CLINICAL DATA:  Cervicalgia, primarily left-sided EXAM: CERVICAL SPINE - COMPLETE 4+ VIEW COMPARISON:  October 03, 2011 FINDINGS: Frontal, lateral, open-mouth odontoid, and bilateral oblique views obtained. Bones appear somewhat osteoporotic. There is no evident fracture. There is 2 mm of anterolisthesis of C4 on C5. There is 3 mm of anterolisthesis of C5 on C6. No other spondylolisthesis is evident. Prevertebral soft tissues and predental space regions are normal. There is severe disc space narrowing at C3-4 and moderately severe disc space narrowing at C6-7. Other disc spaces appear unremarkable. There is facet hypertrophy with exit foraminal narrowing at C3-4, C4-5,  C5-6, and to a lesser extent C6-7 bilaterally, most severe at C4-5 on the right and at C5-6 bilaterally. No erosive changes. Lung apices are clear. IMPRESSION: Progression of arthropathy since the 2013 study. Disc space narrowing is most notable at C3-4 and C6-7. There is facet osteoarthritic change at several levels. Areas of spondylolisthesis are new from the previous study and felt to be due to underlying spondylosis. No fracture evident. Bones appear osteoporotic. Electronically Signed   By: Lowella Grip III M.D.   On: 10/29/2018 13:58    Assessment & Plan:   Problem List Items Addressed This Visit      Unprioritized   Hypertension    Not at goal on maximal doses of amlodipine and metoprolol .Marland Kitchen  She had a decline in GFR during lisinopril trial but had a renal artery doppler US that was normal in 2018. She has no fluid retention,  Will consider adding hydralazine       Other neutropenia (HCC)   Inflammatory polyarthropathy ([place])   Relevant Medications   HYDROcodone-acetaminophen (NORCO) 7.5-325 MG tablet (Start on 01/18/2019)   Chronic  pain syndrome    She has chronic back pain Complicated by scoliosis. She has persistent shoulder pain despite prior reconstruction.  celebrex has been stopped due to CKD and prednisone taper used in the recent past to  manage recent increase in neck pain.   .She has not had any ER visits. Her Refill history was confirmed via [place] Controlled Substance database by me today during her visit and there have been no prescriptions of controlled substances filled from any providers other than me. .Refills for hydrocodone have been printed and given to patient today due to upcoming travel  For sept 29, Oct 29 and Nov 28.      Relevant Medications   HYDROcodone-acetaminophen (NORCO) 7.5-325 MG tablet (Start on 01/18/2019)   Anxiety associated with depression    Improved with decreased wellbutrin dose,  New job opportunities.  Using ambien and alprazolam prn insomnia The risks and benefits of benzodiazepine use were discussed with patient today including excessive sedation leading to respiratory depression,  impaired thinking/driving, and addiction.  Patient was advised to avoid concurrent use with alcohol, to use medication only as needed and not to share with others  .       Relevant Medications   ALPRAZolam (XANAX) 0.25 MG tablet   Chronic kidney disease, stage 3, mod decreased GFR (HCC)    With small left kidney by recent U/S, suggestive of decreased output,  NEPHROLOGY consult pending       Aneurysm ([place])    Other Visit Diagnoses    Chronic kidney disease (CKD), stage III (moderate) (HCC)    -  Primary   Relevant Orders   Lipid panel (Completed)   VITAMIN D 25 Hydroxy (Vit-D Deficiency, Fractures) (Completed)   Microalbumin / creatinine urine ratio (Completed)   Breast cancer screening       Relevant Orders   MM 3D SCREEN BREAST BILATERAL   Need for immunization against influenza       Relevant Orders   Flu Vaccine QUAD 36+ mos IM (Completed)    A total of 25 minutes of face to face time was  spent with patient more than half of which was spent in counselling about the above mentioned conditions  and coordination of care   I have discontinued Wajiha D. Clontz's predniSONE and predniSONE. I am also having her maintain her fluticasone, buPROPion, metoprolol succinate, cyanocobalamin, SYRINGE 3CC/25GX1", amLODipine,  pantoprazole, zolpidem, methocarbamol, omeprazole, HYDROcodone-acetaminophen, and ALPRAZolam. We will continue to administer sodium chloride.  Meds ordered this encounter  Medications   DISCONTD: HYDROcodone-acetaminophen (NORCO) 7.5-325 MG tablet    Sig: Take 1 tablet by mouth every 4 (four) hours as needed for moderate pain.    Dispense:  180 tablet    Refill:  0    MAXIMUM 6 DAILY   DISCONTD: HYDROcodone-acetaminophen (NORCO) 7.5-325 MG tablet    Sig: Take 1 tablet by mouth every 4 (four) hours as needed for moderate pain.    Dispense:  180 tablet    Refill:  0    MAXIMUM 6 DAILY   HYDROcodone-acetaminophen (NORCO) 7.5-325 MG tablet    Sig: Take 1 tablet by mouth every 4 (four) hours as needed for moderate pain.    Dispense:  180 tablet    Refill:  0    MAXIMUM 6 DAILY   ALPRAZolam (XANAX) 0.25 MG tablet    Sig: Take 1 tablet (0.25 mg total) by mouth daily as needed for anxiety or sleep.    Dispense:  30 tablet    Refill:  0    Medications Discontinued During This Encounter  Medication Reason   predniSONE (DELTASONE) 50 MG tablet Completed Course   predniSONE (DELTASONE) 50 MG tablet Completed Course   HYDROcodone-acetaminophen (NORCO) 7.5-325 MG tablet Reorder   HYDROcodone-acetaminophen (NORCO) 7.5-325 MG tablet Reorder   HYDROcodone-acetaminophen (NORCO) 7.5-325 MG tablet Reorder   ALPRAZolam (XANAX) 0.25 MG tablet Reorder    Follow-up: Return in about 3 months (around 02/13/2019).   [name], MD

## 2018-11-18 ENCOUNTER — Encounter: Payer: Self-pay | Admitting: Family Medicine

## 2018-11-19 ENCOUNTER — Other Ambulatory Visit: Payer: Self-pay

## 2018-11-19 DIAGNOSIS — M5090 Cervical disc disorder, unspecified, unspecified cervical region: Secondary | ICD-10-CM

## 2018-11-24 NOTE — Progress Notes (Signed)
[name] [address], [place] [address] Phone: [phone] Subjective:   Adrienne Robinson, am serving as a scribe for Dr. Hulan Saas.  I'm seeing this patient by the request  of:    CC: Neck pain follow-up  [id number]   10/29/2018 Repeat injection given again today.  Tolerated the procedure well.  Discussed posture and ergonomics, which activities to do which wants to avoid.  Patient is to increase activity slowly over the course the next several weeks.  We discussed bracing again at night.  Patient wants to avoid any surgical intervention.  Discussed the possibility of PRP.  Follow-up again in 4 to 8 weeks  Trigger points given injections today.  Tolerated well.  Could be in differential include cervical radiculopathy and will continue to monitor.  Known history of arthritic changes in the neck.  Patient could be having some radicular symptoms.  Seem to have more trigger points though in the shoulder region today and given injections.  Hope that this will be somewhat beneficial.  Due to patient's other medications will change patient's muscle relaxer and see if this will be beneficial.  Discussed with patient about icing regimen and home exercise, which activities to do which wants to avoid.  Follow-up 4 to 8 weeks  Update 11/25/2018 Adrienne Robinson is a 63 y.o. female coming in with complaint of left sided neck pain. Patient states that she has not had any relief in her neck and in the left [facility] joint. Did not notice a difference on the robaxin. Denies any radiating symptoms.  Patient has not made any significant improvement and thinks it is worsening.  Patient states that unfortunately rates the severity of pain is 9 out of 10.     Past Medical History:  Diagnosis Date  . Allergy   . Arthritis   . 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 B12 deficiency 01/2016  . Candida esophagitis ([place])    . Degenerative joint disease involving multiple joints   . Depression   . Dizziness   . Hypertension   . Pneumonia    approx 6 years ago  . Scoliosis   . Spinal stenosis of lumbar region   . Tubular adenoma of colon    Past Surgical History:  Procedure Laterality Date  . ABDOMINAL HYSTERECTOMY    . APPENDECTOMY    . CARDIAC CATHETERIZATION     normal coronaries, Robinson wall motion abnormalities 11/02/09 [facility])  . NASAL SINUS SURGERY    . OOPHORECTOMY    . RADIOLOGY WITH ANESTHESIA  02/26/2012   Procedure: RADIOLOGY WITH ANESTHESIA;  Surgeon: [name], MD;  Location: [place];  Service: Radiology;  Laterality: N/A;  . REVERSE SHOULDER ARTHROPLASTY Right 06/27/2013   Procedure: REVERSE RIGHT TOTAL SHOULDER ARTHROPLASTY;  Surgeon: [name], MD;  Location: [place];  Service: Orthopedics;  Laterality: Right;  . SHOULDER ARTHROSCOPY WITH ROTATOR CUFF REPAIR AND SUBACROMIAL DECOMPRESSION  2007   left  . TONSILLECTOMY  adnoids   Social History   Socioeconomic History  . Marital status: Married    Spouse name: Not on file  . Number of children: 0  . Years of education: 45  . Highest education level: Not on file  Occupational History  . Occupation: Warden/ranger   Social Needs  . Financial resource strain: Not on file  . Food insecurity    Worry: Not on file  Inability: Not on file  . Transportation needs    Medical: Not on file    Non-medical: Not on file  Tobacco Use  . Smoking status: Never Smoker  . Smokeless tobacco: Never Used  Substance and Sexual Activity  . Alcohol use: Yes    Alcohol/week: 2.0 standard drinks    Types: 2 Glasses of wine per week    Comment: 2-3 week with dinner  . Drug use: Robinson  . Sexual activity: Not on file  Lifestyle  . Physical activity    Days per week: Not on file    Minutes per session: Not on file  . Stress: Not on file  Relationships  . Social Herbalist on phone: Not on file    Gets together: Not on file     Attends religious service: Not on file    Active member of club or organization: Not on file    Attends meetings of clubs or organizations: Not on file    Relationship status: Not on file  Other Topics Concern  . Not on file  Social History Narrative   Lives at home with her husband.   Right-handed.   2-3 diet cokes per day.   Allergies  Allergen Reactions  . Morphine And Related Rash  . Adhesive [Tape] Rash and Other (See Comments)    Redness, blisters, skin peeling off.   Family History  Problem Relation Age of Onset  . Colon cancer Father 6  . Diabetes Father   . Heart disease Father   . Rectal cancer Father   . Thyroid disease Mother   . Colon cancer Paternal Grandfather   . Colon cancer Other        pggm  . Breast cancer Paternal Grandmother   . Stomach cancer Neg Hx       Current Outpatient Medications (Cardiovascular):  .  amLODipine (NORVASC) 10 MG tablet, TAKE 1 TABLET BY MOUTH EVERY DAY .  metoprolol succinate (TOPROL-XL) 25 MG 24 hr tablet, TAKE 1 TABLET BY MOUTH EVERY DAY   Current Outpatient Medications (Respiratory):  .  fluticasone (FLONASE) 50 MCG/ACT nasal spray, USE 2 SPRAYS IN EACH NOSTRIL DAILY AS NEEDED   Current Outpatient Medications (Analgesics):  .  [START ON 01/18/2019] HYDROcodone-acetaminophen (NORCO) 7.5-325 MG tablet, Take 1 tablet by mouth every 4 (four) hours as needed for moderate pain.   Current Outpatient Medications (Hematological):  .  cyanocobalamin (,VITAMIN B-12,) 1000 MCG/ML injection, Inject 1 mL (1,000 mcg total) into the muscle once a week.   Current Outpatient Medications (Other):  Marland Kitchen  ALPRAZolam (XANAX) 0.25 MG tablet, Take 1 tablet (0.25 mg total) by mouth daily as needed for anxiety or sleep. Marland Kitchen  buPROPion (WELLBUTRIN XL) 150 MG 24 hr tablet, TAKE 3 TABLETS (450 MG TOTAL) BY MOUTH DAILY. (Patient taking differently: Take 300 mg by mouth daily. ) .  methocarbamol (ROBAXIN) 500 MG tablet, Take 1 tablet (500 mg total) by  mouth 2 (two) times daily. Marland Kitchen  omeprazole (PRILOSEC) 40 MG capsule, Take 40 mg by mouth 2 (two) times daily. .  pantoprazole (PROTONIX) 40 MG tablet, TAKE 1 TABLET BY MOUTH TWICE A DAY .  Syringe/Needle, Disp, (SYRINGE 3CC/25GX1") 25G X 1" 3 ML MISC, Use for b12 injections .  zolpidem (AMBIEN) 10 MG tablet, TAKE 1 TABLET (10 MG TOTAL) BY MOUTH AT BEDTIME AS NEEDED. FOR SLEEP .  diazepam (VALIUM) 5 MG tablet, One tab by mouth, 2 hours before procedure.  Current Facility-Administered Medications (  Other):  .  0.9 %  sodium chloride infusion    Past medical history, social, surgical and family history all reviewed in electronic medical record.  Robinson pertanent information unless stated regarding to the chief complaint.   Review of Systems:  Robinson headache, visual changes, nausea, vomiting, diarrhea, constipation, dizziness, abdominal pain, skin rash, fevers, chills, night sweats, weight loss, swollen lymph nodes, body aches, joint swelling,  chest pain, shortness of breath, mood changes.  Positive muscle aches  Objective  Blood pressure 132/72, pulse 69, height 5\' 6"  (1.676 m), weight 126 lb (57.2 kg), SpO2 98 %.    General: Robinson apparent distress alert and oriented x3 mood and affect normal, dressed appropriately.  HEENT: Pupils equal, extraocular movements intact  Respiratory: Patient's speak in full sentences and does not appear short of breath  Cardiovascular: Robinson lower extremity edema, non tender, Robinson erythema  Skin: Warm dry intact with Robinson signs of infection or rash on extremities or on axial skeleton.  Abdomen: Soft nontender  Neuro: Cranial nerves II through XII are intact, neurovascularly intact in all extremities with 2+ DTRs and 2+ pulses.  Lymph: Robinson lymphadenopathy of posterior or anterior cervical chain or axillae bilaterally.  Gait normal with good balance and coordination.  MSK:  tender with mild limited range of motion and good stability and symmetric strength and tone of shoulders,  elbows, wrist, hip, knee and ankles bilaterally.  Severe arthritic changes of multiple joints Neck exam is significant increase in tightness.  Positive Spurling's with radicular symptoms in the C6-C7 distribution on the left side.  Grip strength seems to be worsening left greater than right.  Even severe tenderness to palpation to light palpation    Impression and Recommendations:     This case required medical decision making of moderate complexity. The above documentation has been reviewed and is accurate and complete Lyndal Pulley, DO       Note: This dictation was prepared with Dragon dictation along with smaller phrase technology. Any transcriptional errors that result from this process are unintentional.

## 2018-11-25 ENCOUNTER — Encounter: Payer: Self-pay | Admitting: Family Medicine

## 2018-11-25 ENCOUNTER — Other Ambulatory Visit: Payer: Self-pay

## 2018-11-25 ENCOUNTER — Ambulatory Visit: Payer: BC Managed Care – PPO | Admitting: Family Medicine

## 2018-11-25 DIAGNOSIS — M5090 Cervical disc disorder, unspecified, unspecified cervical region: Secondary | ICD-10-CM | POA: Diagnosis not present

## 2018-11-25 MED ORDER — DIAZEPAM 5 MG PO TABS: ORAL_TABLET | ORAL | 0 refills | Status: DC

## 2018-11-25 MED ORDER — KETOROLAC TROMETHAMINE 30 MG/ML IJ SOLN
30.0000 mg | Freq: Once | INTRAMUSCULAR | Status: AC
  Administered 2018-11-25: 30 mg via INTRAMUSCULAR

## 2018-11-25 MED ORDER — METHYLPREDNISOLONE ACETATE 40 MG/ML IJ SUSP
40.0000 mg | Freq: Once | INTRAMUSCULAR | Status: AC
  Administered 2018-11-25: 16:00:00 40 mg via INTRAMUSCULAR

## 2018-11-25 NOTE — Addendum Note (Signed)
Addended by: Douglass Rivers T on: 11/25/2018 04:06 PM   Modules accepted: Orders

## 2018-11-25 NOTE — Patient Instructions (Signed)
Good to see you  Sorry you are hurting.  Tylenol 3 times a day

## 2018-11-25 NOTE — Assessment & Plan Note (Signed)
Worsening symptoms with radicular symptoms.  Reviewed reviewed patient's previous imaging.  Significant progression noted.  I do think the patient needs advanced imaging including an MRI and would be a candidate for possible epidurals if needed.  Patient is in agreement with the plan.  Patient does have claustrophobia given Valium.  Doing an open MRI.  Follow-up after imaging spent  25 minutes with patient face-to-face and had greater than 50% of counseling including as described above in assessment and plan.

## 2018-11-28 DIAGNOSIS — M47812 Spondylosis without myelopathy or radiculopathy, cervical region: Secondary | ICD-10-CM | POA: Diagnosis not present

## 2018-11-28 DIAGNOSIS — M50222 Other cervical disc displacement at C5-C6 level: Secondary | ICD-10-CM | POA: Diagnosis not present

## 2018-12-05 ENCOUNTER — Encounter: Payer: Self-pay | Admitting: Family Medicine

## 2018-12-06 ENCOUNTER — Encounter: Payer: Self-pay | Admitting: Family Medicine

## 2018-12-09 ENCOUNTER — Other Ambulatory Visit: Payer: Self-pay

## 2018-12-09 ENCOUNTER — Encounter: Payer: Self-pay | Admitting: Family Medicine

## 2018-12-09 ENCOUNTER — Telehealth: Payer: Self-pay

## 2018-12-09 DIAGNOSIS — M5412 Radiculopathy, cervical region: Secondary | ICD-10-CM

## 2018-12-09 NOTE — Telephone Encounter (Signed)
Copied from [place] [phone]. Topic: General - Other >> Dec 09, 2018 11:07 AM Pauline Good wrote: Reason for CRM: pt need call back from nurse concerning pain medication for her nerves

## 2018-12-09 NOTE — Telephone Encounter (Signed)
Spoke with pt and she stated that she has a severely pinched nerve at C6 and C7. She is not scheduled for the epidural injection until Friday but her husband just came home from the hospital and will have to go to his doctor on Friday. So pt is afraid that she is going to have to push her appt out further. Pt stated that she is having to take her hydrocodone more than prescribed and is wanting to know if she could get an early refill.

## 2018-12-09 NOTE — Telephone Encounter (Signed)
IF SHE HAS GONE THROUGH 180 Vicodin in 20 days , that is too much tylenol. She will need a stronger medication for a short while, but  I do not change medications without an office visit,  It can be a virtual or a telephone

## 2018-12-11 NOTE — Telephone Encounter (Signed)
LMTCB. Need to schedule pt for a virtual/telephone visit with Dr. Derrel Nip this week.

## 2018-12-13 ENCOUNTER — Encounter: Payer: Self-pay | Admitting: Internal Medicine

## 2018-12-13 ENCOUNTER — Inpatient Hospital Stay: Admission: RE | Admit: 2018-12-13 | Payer: BC Managed Care – PPO | Source: Ambulatory Visit

## 2018-12-13 ENCOUNTER — Ambulatory Visit
Admission: RE | Admit: 2018-12-13 | Discharge: 2018-12-13 | Disposition: A | Discharge status: Home / Self Care | Payer: BC Managed Care – PPO | Source: Ambulatory Visit | Attending: Family Medicine | Admitting: Family Medicine

## 2018-12-13 ENCOUNTER — Other Ambulatory Visit: Payer: Self-pay

## 2018-12-13 ENCOUNTER — Telehealth: Payer: Self-pay | Admitting: Internal Medicine

## 2018-12-13 ENCOUNTER — Other Ambulatory Visit: Payer: Self-pay | Admitting: Internal Medicine

## 2018-12-13 ENCOUNTER — Ambulatory Visit (INDEPENDENT_AMBULATORY_CARE_PROVIDER_SITE_OTHER): Payer: BC Managed Care – PPO | Admitting: Internal Medicine

## 2018-12-13 DIAGNOSIS — M542 Cervicalgia: Secondary | ICD-10-CM | POA: Diagnosis not present

## 2018-12-13 DIAGNOSIS — M5090 Cervical disc disorder, unspecified, unspecified cervical region: Secondary | ICD-10-CM | POA: Diagnosis not present

## 2018-12-13 DIAGNOSIS — M5412 Radiculopathy, cervical region: Secondary | ICD-10-CM

## 2018-12-13 MED ORDER — HYDROCODONE-ACETAMINOPHEN 10-325 MG PO TABS: 1.0000 | ORAL_TABLET | ORAL | 0 refills | Status: DC | PRN

## 2018-12-13 MED ORDER — TRIAMCINOLONE ACETONIDE 40 MG/ML IJ SUSP (RADIOLOGY)
60.0000 mg | Freq: Once | INTRAMUSCULAR | Status: AC
  Administered 2018-12-13: 60 mg via EPIDURAL

## 2018-12-13 MED ORDER — IOPAMIDOL (ISOVUE-M 300) INJECTION 61%
1.0000 mL | Freq: Once | INTRAMUSCULAR | Status: AC
  Administered 2018-12-13: 1 mL via EPIDURAL

## 2018-12-13 NOTE — Telephone Encounter (Signed)
SPOKE WITH PHARMACIST BRITTANY AND SHE IS NOW COMFORTABLE FILLING THE HIGHER DOSE HYDROCODONE TODAY  .  IT WILL BE READY "SHORTLY."

## 2018-12-13 NOTE — Progress Notes (Signed)
Virtual Visit via [email or web address] Note  This visit type was conducted due to national recommendations for restrictions regarding the COVID-19 pandemic (e.g. social distancing).  This format is felt to be most appropriate for this patient at this time.  All issues noted in this document were discussed and addressed.  No physical exam was performed (except for noted visual exam findings with Video Visits).   I connected with@ on 12/13/18 at  9:30 AM EDT by a video enabled telemedicine application and verified that I am speaking with the correct person using two identifiers. Location patient: home Location provider: work or home office Persons participating in the virtual visit: patient, provider  I discussed the limitations, risks, security and privacy concerns of performing an evaluation and management service by telephone and the availability of in person appointments. I also discussed with the patient that there may be a patient responsible charge related to this service. The patient expressed understanding and agreed to proceed.    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 He is a poorly controlled diabetic with prior BKA .  She has been assisting him with sitting up and ambulating, which has  put more stress on her shoulders and spine.  She received a cerivcal ESI injection at 7:15 this morning but was told not to expect an ipovement in her pain for up to 5 days    ROS: See pertinent positives and negatives per HPI.  Past Medical History:  Diagnosis Date  . Allergy   . Arthritis   . AVM (arteriovenous  malformation) brain Dec 2013   s/p embolization  Feb 26 2012, [name]  . B12 deficiency 01/2016  . Candida esophagitis ([place])   . Degenerative joint disease involving multiple joints   . Depression   . Dizziness   . Hypertension   . Pneumonia    approx 6 years ago  . Scoliosis   . Spinal stenosis of lumbar region   . Tubular adenoma of colon     Past Surgical History:  Procedure Laterality Date  . ABDOMINAL HYSTERECTOMY    . APPENDECTOMY    . CARDIAC CATHETERIZATION     normal coronaries, no wall motion abnormalities 11/02/09 [facility])  . NASAL SINUS SURGERY    . OOPHORECTOMY    . RADIOLOGY WITH ANESTHESIA  02/26/2012   Procedure: RADIOLOGY WITH ANESTHESIA;  Surgeon: [name], MD;  Location: [place];  Service: Radiology;  Laterality: N/A;  . REVERSE SHOULDER ARTHROPLASTY Right 06/27/2013   Procedure: REVERSE RIGHT TOTAL SHOULDER ARTHROPLASTY;  Surgeon: [name], MD;  Location: [place];  Service: Orthopedics;  Laterality: Right;  . SHOULDER ARTHROSCOPY WITH ROTATOR CUFF REPAIR AND SUBACROMIAL DECOMPRESSION  2007   left  . TONSILLECTOMY  adnoids    Family History  Problem Relation Age of Onset  . Colon cancer Father 90  . Diabetes Father   . Heart disease Father   . Rectal cancer Father   . Thyroid disease Mother   . Colon cancer Paternal Grandfather   . Colon cancer Other  pggm  . Breast cancer Paternal Grandmother   . Stomach cancer Neg Hx     SOCIAL HX:  reports that she has never smoked. She has never used smokeless tobacco. She reports current alcohol use of about 2.0 standard drinks of alcohol per week. She reports that she does not use drugs.   Current Outpatient Medications:  .  ALPRAZolam (XANAX) 0.25 MG tablet, Take 1 tablet (0.25 mg total) by mouth daily as needed for anxiety or sleep., Disp: 30 tablet, Rfl: 0 .  amLODipine (NORVASC) 10 MG tablet, TAKE 1 TABLET BY MOUTH EVERY DAY, Disp: 90 tablet, Rfl: 1 .  buPROPion (WELLBUTRIN XL) 150 MG 24  hr tablet, TAKE 3 TABLETS (450 MG TOTAL) BY MOUTH DAILY. (Patient taking differently: Take 300 mg by mouth daily. ), Disp: 270 tablet, Rfl: 1 .  cyanocobalamin (,VITAMIN B-12,) 1000 MCG/ML injection, Inject 1 mL (1,000 mcg total) into the muscle once a week., Disp: 10 mL, Rfl: 11 .  diazepam (VALIUM) 5 MG tablet, One tab by mouth, 2 hours before procedure., Disp: 2 tablet, Rfl: 0 .  fluticasone (FLONASE) 50 MCG/ACT nasal spray, USE 2 SPRAYS IN EACH NOSTRIL DAILY AS NEEDED, Disp: 16 g, Rfl: 1 .  [START ON 01/18/2019] HYDROcodone-acetaminophen (NORCO) 7.5-325 MG tablet, Take 1 tablet by mouth every 4 (four) hours as needed for moderate pain., Disp: 180 tablet, Rfl: 0 .  methocarbamol (ROBAXIN) 500 MG tablet, Take 1 tablet (500 mg total) by mouth 2 (two) times daily., Disp: 60 tablet, Rfl: 1 .  metoprolol succinate (TOPROL-XL) 25 MG 24 hr tablet, TAKE 1 TABLET BY MOUTH EVERY DAY, Disp: 90 tablet, Rfl: 1 .  omeprazole (PRILOSEC) 40 MG capsule, Take 40 mg by mouth 2 (two) times daily., Disp: , Rfl:  .  pantoprazole (PROTONIX) 40 MG tablet, TAKE 1 TABLET BY MOUTH TWICE A DAY, Disp: 180 tablet, Rfl: 1 .  Syringe/Needle, Disp, (SYRINGE 3CC/25GX1") 25G X 1" 3 ML MISC, Use for b12 injections, Disp: 50 each, Rfl: 0 .  zolpidem (AMBIEN) 10 MG tablet, TAKE 1 TABLET (10 MG TOTAL) BY MOUTH AT BEDTIME AS NEEDED. FOR SLEEP, Disp: 30 tablet, Rfl: 2 .  HYDROcodone-acetaminophen (NORCO) 10-325 MG tablet, Take 1 tablet by mouth every 4 (four) hours as needed for severe pain., Disp: 180 tablet, Rfl: 0  Current Facility-Administered Medications:  .  0.9 %  sodium chloride infusion, 500 mL, Intravenous, Continuous, Pyrtle, Lajuan Lines, MD  EXAM:  VITALS per patient if applicable:  GENERAL: alert, oriented, appears to be in pain   HEENT: atraumatic, conjunttiva clear, no obvious abnormalities on inspection of external nose and ears  NECK: normal movements of the head and neck  LUNGS: on inspection no signs of respiratory  distress, breathing rate appears normal, no obvious gross SOB, gasping or wheezing  CV: no obvious cyanosis  MS: moves all visible extremities without noticeable abnormality  PSYCH/NEURO: pleasant and cooperative, no obvious depression or anxiety, speech and thought processing grossly intact  ASSESSMENT AND PLAN:  Discussed the following assessment and plan:  Cervical neck pain with evidence of disc disease  Cervical neck pain with evidence of disc disease Current escalation of pain expected to be transient . Increase vicodin to 10/325  Every 4 hours for one month only      I discussed the assessment and treatment plan with the patient. The patient was provided an opportunity to ask questions and all were answered. The patient agreed with the plan and demonstrated an understanding of  the instructions.   The patient was advised to call back or seek an in-person evaluation if the symptoms worsen or if the condition fails to improve as anticipated.  I provided 15 minutes of non-face-to-face time during this encounter.   [name], MD

## 2018-12-13 NOTE — Telephone Encounter (Signed)
Pt scheduled an appt with Dr. Derrel Nip for today.

## 2018-12-13 NOTE — Discharge Instructions (Signed)

## 2018-12-13 NOTE — Telephone Encounter (Signed)
Patient is calling to speak with Adrienne Robinson regarding her prescription that was sent to the wrong pharmacy.  Patient did not know where it was sent, but she stated that she is in a lot of pain and needs the medication.  Please advise and call to discuss at [phone]

## 2018-12-13 NOTE — Assessment & Plan Note (Signed)
Current escalation of pain expected to be transient . Increase vicodin to 10/325  Every 4 hours for one month only

## 2018-12-13 NOTE — Telephone Encounter (Signed)
Patient thanked you very much< she will pick up medication today.

## 2018-12-13 NOTE — Telephone Encounter (Signed)
Patient requesting call back from [place]. She would like to know if her insurance was contacted about medication.

## 2018-12-19 ENCOUNTER — Other Ambulatory Visit: Payer: BC Managed Care – PPO

## 2018-12-26 ENCOUNTER — Encounter: Payer: Self-pay | Admitting: Family Medicine

## 2018-12-27 ENCOUNTER — Other Ambulatory Visit: Payer: Self-pay | Admitting: Family Medicine

## 2018-12-27 ENCOUNTER — Other Ambulatory Visit: Payer: Self-pay | Admitting: *Deleted

## 2018-12-27 DIAGNOSIS — M5090 Cervical disc disorder, unspecified, unspecified cervical region: Secondary | ICD-10-CM

## 2018-12-30 ENCOUNTER — Other Ambulatory Visit: Payer: Self-pay

## 2018-12-30 DIAGNOSIS — M5090 Cervical disc disorder, unspecified, unspecified cervical region: Secondary | ICD-10-CM

## 2019-01-01 ENCOUNTER — Other Ambulatory Visit: Payer: Self-pay | Admitting: Family Medicine

## 2019-01-01 ENCOUNTER — Other Ambulatory Visit: Payer: Self-pay

## 2019-01-01 ENCOUNTER — Ambulatory Visit
Admission: RE | Admit: 2019-01-01 | Discharge: 2019-01-01 | Disposition: A | Discharge status: Home / Self Care | Payer: BC Managed Care – PPO | Source: Ambulatory Visit | Attending: Family Medicine | Admitting: Family Medicine

## 2019-01-01 DIAGNOSIS — M5090 Cervical disc disorder, unspecified, unspecified cervical region: Secondary | ICD-10-CM

## 2019-01-01 DIAGNOSIS — M4692 Unspecified inflammatory spondylopathy, cervical region: Secondary | ICD-10-CM | POA: Diagnosis not present

## 2019-01-01 MED ORDER — TRIAMCINOLONE ACETONIDE 40 MG/ML IJ SUSP (RADIOLOGY)
60.0000 mg | Freq: Once | INTRAMUSCULAR | Status: AC
  Administered 2019-01-01: 60 mg via INTRA_ARTICULAR

## 2019-01-01 MED ORDER — IOPAMIDOL (ISOVUE-M 300) INJECTION 61%
1.0000 mL | Freq: Once | INTRAMUSCULAR | Status: AC
  Administered 2019-01-01: 1 mL via INTRA_ARTICULAR

## 2019-01-03 ENCOUNTER — Other Ambulatory Visit: Payer: Self-pay | Admitting: *Deleted

## 2019-01-03 DIAGNOSIS — M5090 Cervical disc disorder, unspecified, unspecified cervical region: Secondary | ICD-10-CM

## 2019-01-07 ENCOUNTER — Other Ambulatory Visit: Payer: Self-pay | Admitting: Internal Medicine

## 2019-01-08 ENCOUNTER — Ambulatory Visit: Payer: BC Managed Care – PPO

## 2019-01-09 NOTE — Telephone Encounter (Signed)
Refilled

## 2019-01-10 ENCOUNTER — Telehealth: Payer: Self-pay | Admitting: Internal Medicine

## 2019-01-10 NOTE — Telephone Encounter (Signed)
Pt states that she needs to speak with nurse about HYDROcodone-acetaminophen (NORCO) 10-325 MG tablet.  States that it is time fore refill, but she needs to speak with someone about how this is going before it gets sent in.

## 2019-01-13 MED ORDER — HYDROCODONE-ACETAMINOPHEN 10-325 MG PO TABS: 1.0000 | ORAL_TABLET | Freq: Four times a day (QID) | ORAL | 0 refills | Status: DC | PRN

## 2019-01-13 NOTE — Telephone Encounter (Signed)
Pt called to speak with Anderson Malta about her Hydrocodone refill before it is sent to the pharmacy/ Pt states it is paste due the refill time / please advise

## 2019-01-13 NOTE — Telephone Encounter (Signed)
She received 180 on oct 23.   120 qty refilled today and MyChart message sent

## 2019-01-13 NOTE — Telephone Encounter (Signed)
Patient called again for [facility].  She would like a call back.

## 2019-01-13 NOTE — Telephone Encounter (Signed)
Spoke with pt and she stated that she is due for a refill on the hydrocodone 10-325mg .   Refilled: 12/13/2018 Last OV: 12/13/2018 Next OV: not scheduled

## 2019-01-15 ENCOUNTER — Other Ambulatory Visit: Payer: Self-pay | Admitting: Internal Medicine

## 2019-01-15 NOTE — Telephone Encounter (Signed)
Refilled: 10/14/2018 Last OV: 12/13/2018 Next OV: not scheduled

## 2019-02-03 ENCOUNTER — Encounter: Payer: Self-pay | Admitting: Family Medicine

## 2019-02-03 DIAGNOSIS — M5090 Cervical disc disorder, unspecified, unspecified cervical region: Secondary | ICD-10-CM

## 2019-02-06 MED ORDER — HYDROCODONE-ACETAMINOPHEN 10-325 MG PO TABS: 1.0000 | ORAL_TABLET | Freq: Four times a day (QID) | ORAL | 0 refills | Status: DC | PRN

## 2019-02-19 ENCOUNTER — Other Ambulatory Visit: Payer: BC Managed Care – PPO

## 2019-02-26 ENCOUNTER — Other Ambulatory Visit: Payer: Self-pay | Admitting: Internal Medicine

## 2019-02-26 DIAGNOSIS — Z79899 Other long term (current) drug therapy: Secondary | ICD-10-CM

## 2019-02-26 MED ORDER — TERBINAFINE HCL 250 MG PO TABS: 250.0000 mg | ORAL_TABLET | Freq: Every day | ORAL | 0 refills | Status: DC

## 2019-03-04 ENCOUNTER — Ambulatory Visit: Payer: BC Managed Care – PPO | Attending: Internal Medicine

## 2019-03-04 DIAGNOSIS — Z20822 Contact with and (suspected) exposure to covid-19: Secondary | ICD-10-CM

## 2019-03-04 MED ORDER — FAMCICLOVIR 500 MG PO TABS: 1000.0000 mg | ORAL_TABLET | Freq: Two times a day (BID) | ORAL | 3 refills | Status: DC

## 2019-03-05 ENCOUNTER — Other Ambulatory Visit: Payer: Self-pay

## 2019-03-05 ENCOUNTER — Telehealth: Payer: Self-pay | Admitting: Internal Medicine

## 2019-03-05 ENCOUNTER — Inpatient Hospital Stay: Admission: RE | Admit: 2019-03-05 | Payer: BC Managed Care – PPO | Source: Ambulatory Visit

## 2019-03-05 ENCOUNTER — Encounter: Payer: Self-pay | Admitting: Internal Medicine

## 2019-03-05 ENCOUNTER — Ambulatory Visit (INDEPENDENT_AMBULATORY_CARE_PROVIDER_SITE_OTHER): Payer: BC Managed Care – PPO | Admitting: Internal Medicine

## 2019-03-05 VITALS — BP 170/90 | Ht 66.0 in | Wt 118.0 lb

## 2019-03-05 DIAGNOSIS — N1831 Chronic kidney disease, stage 3a: Secondary | ICD-10-CM

## 2019-03-05 DIAGNOSIS — R1013 Epigastric pain: Secondary | ICD-10-CM | POA: Diagnosis not present

## 2019-03-05 DIAGNOSIS — G894 Chronic pain syndrome: Secondary | ICD-10-CM

## 2019-03-05 DIAGNOSIS — Z8719 Personal history of other diseases of the digestive system: Secondary | ICD-10-CM

## 2019-03-05 DIAGNOSIS — R140 Abdominal distension (gaseous): Secondary | ICD-10-CM

## 2019-03-05 DIAGNOSIS — F418 Other specified anxiety disorders: Secondary | ICD-10-CM | POA: Diagnosis not present

## 2019-03-05 LAB — NOVEL CORONAVIRUS, NAA: SARS-CoV-2, NAA: NOT DETECTED

## 2019-03-05 MED ORDER — NALOXONE HCL 0.4 MG/ML IJ SOLN: 0.4000 mg | INTRAMUSCULAR | 3 refills | Status: DC | PRN

## 2019-03-05 MED ORDER — ALPRAZOLAM 0.25 MG PO TABS: 0.2500 mg | ORAL_TABLET | Freq: Two times a day (BID) | ORAL | 0 refills | Status: DC | PRN

## 2019-03-05 MED ORDER — SUCRALFATE 1 G PO TABS: 1.0000 g | ORAL_TABLET | Freq: Three times a day (TID) | ORAL | 0 refills | Status: DC

## 2019-03-05 NOTE — Assessment & Plan Note (Signed)
She deferred nephrology evaluation due to husband's surgery  Repeat cr was normal in September

## 2019-03-05 NOTE — Telephone Encounter (Signed)
Lm to set up non-fasting lab in 6 weeks and a 10m follow up.

## 2019-03-05 NOTE — Assessment & Plan Note (Signed)
Famciclovir requested for current flare and sent.

## 2019-03-05 NOTE — Assessment & Plan Note (Signed)
Persistent , now with epigastric pain despite 80 mg PPI bid.  Continue sucralfate, refer to GI for evaluation.

## 2019-03-05 NOTE — Assessment & Plan Note (Signed)
Aggravated by unemployment,  Increased financial stressors .  Continue wellbutrin for depression,  #20 alprazolam low dose given along with naloxone Injectible rx.  The risks and benefits of benzodiazepine use were discussed with patient today including excessive sedation leading to respiratory depression,  impaired thinking/driving, and addiction.  Patient was advised to avoid concurrent use with alcohol, to use medication only as needed and not to share with others  .

## 2019-03-05 NOTE — Assessment & Plan Note (Signed)
She has chronic back pain Complicated by scoliosis. She has persistent shoulder pain despite prior reconstruction.  celebrex has been stopped due to CKD and prednisone taper used in the recent past to  manage recent increase in neck pain.   .She has not had any ER visits. Her Refill history was confirmed via [place] Controlled Substance database by me today during her visit and there have been no prescriptions of controlled substances filled from any providers other than me. .Will refill for 3 months

## 2019-03-05 NOTE — Progress Notes (Signed)
. Virtual Visit via [email or web address]  This visit type was conducted due to national recommendations for restrictions regarding the COVID-19 pandemic (e.g. social distancing).  This format is felt to be most appropriate for this patient at this time.  All issues noted in this document were discussed and addressed.  No physical exam was performed (except for noted visual exam findings with Video Visits).   I connected with@ on 03/05/19 at  9:00 AM EST by a video enabled telemedicine application  and verified that I am speaking with the correct person using two identifiers. Location patient: home Location provider: work or home office Persons participating in the virtual visit: patient, provider  I discussed the limitations, risks, security and privacy concerns of performing an evaluation and management service by telephone and the availability of in person appointments. I also discussed with the patient that there may be a patient responsible charge related to this service. The patient expressed understanding and agreed to proceed.  Reason for visit: epigastric pain HPI:  64 yr old female with history f H Pylori ulcer in 2014,  Presents with recent onset of pain in the epigastric region.  Occurred 3 days ago described as pain and burning,  Occurred on en empty stomach.  No prior use pf NSIADs,  And no recent alcohol although prior to 2 weeks ago , she reports that she wa  drinking 2-3 glasses of wine per week.  Has had bloating and anorexia for the past  2 weeks.  Pain has resolved with use of sucralfate qid for the past 4 days and pepto bismol once daily along with  Has been Taking pantoprazole and omeprazole  40 mg each,    Twice daily  For at least a year   History of ulcer remote.  Normal EGD 2018.  Marland Kitchen  Symptoms have been present for only 2 weeks . Also taking pepto bismol once daily in the am.     cites recent stressors as contributing:  Husband now legally blind and she remains out of work.  Feels very  anxious,  Requesting refill on valium or "something for my nerves " to use prn  Reviewed med history,  Risk for overdose given chronic use of hydrocodone and ambien.    Chronic pain:  Refill history confirmed via [place] Controlled Substance databas, accessed by me today..   ROS: See pertinent positives and negatives per HPI.  Past Medical History:  Diagnosis Date  . Allergy   . Arthritis   . 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 B12 deficiency 01/2016  . Candida esophagitis ([place])   . Degenerative joint disease involving multiple joints   . Depression   . Dizziness   . Hypertension   . Pneumonia    approx 6 years ago  . Scoliosis   . Spinal stenosis of lumbar region   . Tubular adenoma of colon     Past Surgical History:  Procedure Laterality Date  . ABDOMINAL HYSTERECTOMY    . APPENDECTOMY    . CARDIAC CATHETERIZATION     normal coronaries, no wall motion abnormalities 11/02/09 [facility])  . NASAL SINUS SURGERY    . OOPHORECTOMY    . RADIOLOGY WITH ANESTHESIA  02/26/2012   Procedure: RADIOLOGY WITH ANESTHESIA;  Surgeon: [name], MD;  Location: [place];  Service: Radiology;  Laterality: N/A;  . REVERSE SHOULDER ARTHROPLASTY Right 06/27/2013   Procedure: REVERSE RIGHT TOTAL SHOULDER ARTHROPLASTY;  Surgeon:  [name], MD;  Location: [place];  Service: Orthopedics;  Laterality: Right;  . SHOULDER ARTHROSCOPY WITH ROTATOR CUFF REPAIR AND SUBACROMIAL DECOMPRESSION  2007   left  . TONSILLECTOMY  adnoids    Family History  Problem Relation Age of Onset  . Colon cancer Father 22  . Diabetes Father   . Heart disease Father   . Rectal cancer Father   . Thyroid disease Mother   . Colon cancer Paternal Grandfather   . Colon cancer Other        pggm  . Breast cancer Paternal Grandmother   . Stomach cancer Neg Hx     SOCIAL HX:  reports that she has never smoked. She has never used smokeless tobacco. She reports  current alcohol use of about 2.0 standard drinks of alcohol per week. She reports that she does not use drugs.   Current Outpatient Medications:  .  amLODipine (NORVASC) 10 MG tablet, TAKE 1 TABLET BY MOUTH EVERY DAY, Disp: 90 tablet, Rfl: 1 .  buPROPion (WELLBUTRIN XL) 150 MG 24 hr tablet, TAKE 3 TABLETS (450 MG TOTAL) BY MOUTH DAILY. (Patient taking differently: Take 300 mg by mouth daily. ), Disp: 270 tablet, Rfl: 1 .  cyanocobalamin (,VITAMIN B-12,) 1000 MCG/ML injection, Inject 1 mL (1,000 mcg total) into the muscle once a week., Disp: 10 mL, Rfl: 11 .  famciclovir (FAMVIR) 500 MG tablet, Take 2 tablets (1,000 mg total) by mouth 2 (two) times daily., Disp: 14 tablet, Rfl: 3 .  fluticasone (FLONASE) 50 MCG/ACT nasal spray, USE 2 SPRAYS IN EACH NOSTRIL DAILY AS NEEDED, Disp: 16 g, Rfl: 1 .  HYDROcodone-acetaminophen (NORCO) 10-325 MG tablet, Take 1 tablet by mouth every 6 (six) hours as needed for severe pain., Disp: 120 tablet, Rfl: 0 .  metoprolol succinate (TOPROL-XL) 25 MG 24 hr tablet, TAKE 1 TABLET BY MOUTH EVERY DAY, Disp: 90 tablet, Rfl: 1 .  omeprazole (PRILOSEC) 40 MG capsule, TAKE 1 CAPSULE BY MOUTH TWICE A DAY, Disp: 180 capsule, Rfl: 1 .  pantoprazole (PROTONIX) 40 MG tablet, TAKE 1 TABLET BY MOUTH TWICE A DAY, Disp: 180 tablet, Rfl: 1 .  Syringe/Needle, Disp, (SYRINGE 3CC/25GX1") 25G X 1" 3 ML MISC, Use for b12 injections, Disp: 50 each, Rfl: 0 .  terbinafine (LAMISIL) 250 MG tablet, Take 1 tablet (250 mg total) by mouth daily., Disp: 90 tablet, Rfl: 0 .  zolpidem (AMBIEN) 10 MG tablet, TAKE 1 TABLET (10 MG TOTAL) BY MOUTH AT BEDTIME AS NEEDED. FOR SLEEP, Disp: 30 tablet, Rfl: 2 .  ALPRAZolam (XANAX) 0.25 MG tablet, Take 1 tablet (0.25 mg total) by mouth 2 (two) times daily as needed for anxiety., Disp: 20 tablet, Rfl: 0 .  naloxone (NARCAN) 0.4 MG/ML injection, Inject 1 mL (0.4 mg total) into the vein as needed., Disp: 1 mL, Rfl: 3 .  sucralfate (CARAFATE) 1 g tablet, Take 1  tablet (1 g total) by mouth 4 (four) times daily -  with meals and at bedtime., Disp: 120 tablet, Rfl: 0  Current Facility-Administered Medications:  .  0.9 %  sodium chloride infusion, 500 mL, Intravenous, Continuous, Pyrtle, Lajuan Lines, MD  EXAM:  VITALS per patient if applicable:  GENERAL: alert, oriented, appears well and in no acute distress  HEENT: atraumatic, conjunttiva clear, no obvious abnormalities on inspection of external nose and ears  NECK: normal movements of the head and neck  LUNGS: on inspection no signs of respiratory distress, breathing rate appears normal, no obvious gross SOB,  gasping or wheezing  CV: no obvious cyanosis  MS: moves all visible extremities without noticeable abnormality  PSYCH/NEURO: pleasant and cooperative, no obvious depression or anxiety, speech and thought processing grossly intact  ASSESSMENT AND PLAN:  Discussed the following assessment and plan:  Acute epigastric pain - Plan: Ambulatory referral to Gastroenterology  Abdominal bloating  Anxiety associated with depression  Stage 3a chronic kidney disease  Chronic pain syndrome  History of oral aphthous ulcers  Abdominal bloating Persistent , now with epigastric pain despite 80 mg PPI bid.  Continue sucralfate, refer to GI for evaluation.    Anxiety associated with depression Aggravated by unemployment,  Increased financial stressors .  Continue wellbutrin for depression,  #20 alprazolam low dose given along with naloxone Injectible rx.  The risks and benefits of benzodiazepine use were discussed with patient today including excessive sedation leading to respiratory depression,  impaired thinking/driving, and addiction.  Patient was advised to avoid concurrent use with alcohol, to use medication only as needed and not to share with others  .   Chronic kidney disease, stage 3, mod decreased GFR (HCC) She deferred nephrology evaluation due to husband's surgery  Repeat cr was normal in  September  Chronic pain syndrome She has chronic back pain Complicated by scoliosis. She has persistent shoulder pain despite prior reconstruction.  celebrex has been stopped due to CKD and prednisone taper used in the recent past to  manage recent increase in neck pain.   .She has not had any ER visits. Her Refill history was confirmed via [place] Controlled Substance database by me today during her visit and there have been no prescriptions of controlled substances filled from any providers other than me. .Will refill for 3 months   History of oral aphthous ulcers Famciclovir requested for current flare and sent.     I discussed the assessment and treatment plan with the patient. The patient was provided an opportunity to ask questions and all were answered. The patient agreed with the plan and demonstrated an understanding of the instructions.   The patient was advised to call back or seek an in-person evaluation if the symptoms worsen or if the condition fails to improve as anticipated.  I provided 45 minutes of non-face-to-face time during this encounter reviewing patient's current problems and past procedures/imaging studies, providing counseling on the above mentioned problems , and coordination  of care .   [name], MD

## 2019-03-06 ENCOUNTER — Encounter: Payer: Self-pay | Admitting: Physician Assistant

## 2019-03-07 ENCOUNTER — Ambulatory Visit: Payer: BC Managed Care – PPO | Admitting: Internal Medicine

## 2019-03-10 NOTE — Progress Notes (Addendum)
03/10/2019 JUNIATA Robinson [id number] Dec 04, 1955   HISTORY OF PRESENT ILLNESS: Adrienne Robinson is a 63 year old female with a past medical history of depression, hypertension, brain AVM s/p emobolization 02/2012 followed by neuroradiologist and has a surveillance brain MRI annually, scoliosis and colon polyps. Past total hysterectomy age 64, appendectomy, left rotator cuff surgery and right total shoulder replacement. She presents today with complaints of epigastric pain with associated indigestion and upper abdominal bloat which started last week. She stated she her current symptoms are similar to when she was diagnosed with H. Pylori in the past ( it is unclear when she had H. Pylori).  She is unable to eat due to her epigastric pain. Decreased appetite. No nausea or vomiting. She reported taking Omeprazole 40mg  po bid and Pantoprazole 40mg  po bid for the past year. She was seen by her PCP, Dr. Derrel Nip, 03/05/2019 who advised her to stop the Omeprazole as she was taking an excessive amount of PPIs. She is currently taking Pantoprazole 40mg  po bid. Dr. Derrel Nip also added Carafate 1gm po QD. She was taking Pepto bismal 2 capfuls Q 4 hours for the past 2 weeks. She stopped taking Peptobismal 3 days ago. She reported passing black tarry stools after she started taking the Pepto bismal. She passes a black solid stool this morning. She typically passes a normal formed brown BM most days. No bright red rectal bleeding. No NSAIDS. No longer has constipation. No dysuria or hematuria. Her weight is 117lbs which she states is a normal weight for her. No fever, sweats or chills. She drinks 1 to 2 glasses of white wine weekly. No caffeine. She often skips meals, grazes. Her father was diagnosed with colon cancer at the age of 63, paternal grandfather and paternal great grandmother with history of colon cancer. No other complaints today.   Abdominal sonogram 08/30/2018: Gallbladder: No gallstones or wall thickening  visualized. There is no pericholecystic fluid. No sonographic Murphy sign noted by sonographer. Common bile duct: Diameter: 4 mm. No intrahepatic, common hepatic, or common bile duct dilatation. Liver: No focal lesion identified. Within normal limits in parenchymal echogenicity. Portal vein is patent on color Doppler imaging with normal direction of blood flow towards the liver. Pancreas: No pancreatic mass or inflammatory focus. Spleen: Size and appearance within normal limits. Left kidney somewhat small but otherwise normal in appearance. Right kidney appears within normal limits. Aortic Atherosclerosis   EGD 07/10/2016 by Dr. Hilarie Fredrickson: - Normal esophagus. - Normal stomach. Reactive gastropathy. No H. Pylori.  - Normal examined duodenum. No evidence of celiac disease.   EGD 2/272014: Mildly severe esophagitis was found in the entire esophagus.   Diffuse moderate inflammation characterized by erythema was found in the gastric body.   Antral biopsies showed mild foveolar hyperplasia and mild chronic gastritis.  No evidence of Helicobacter pylori. No dysplasia. ADDENDUM: further review of EGD pathology KOH prep of esophageal brushing + budding yeast c/s candidiasis esophagitis    Colonoscopy 07/10/2016 by Dr. Hilarie Fredrickson: - The digital rectal exam was normal. - A 3 mm tubular adenomatous polyp was found in the transverse colon. No dysplasia.  - Anal papilla(e) were hypertrophied on retroflexed views in the rectum. - The exam was otherwise without abnormality. -Repeat colonoscopy in 5 years.  Colonoscopy 03/21/2012 by Dr. Candace Cruise: Normal colonoscopy.   Past Medical History:  Diagnosis Date  . Allergy   . Arthritis   . 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 B12 deficiency 01/2016  . Candida esophagitis ([place])   . Degenerative joint disease involving multiple joints   . Depression   . Dizziness   . Hypertension   . Pneumonia     approx 6 years ago  . Scoliosis   . Spinal stenosis of lumbar region   . Tubular adenoma of colon    Past Surgical History:  Procedure Laterality Date  . ABDOMINAL HYSTERECTOMY    . APPENDECTOMY    . CARDIAC CATHETERIZATION     normal coronaries, no wall motion abnormalities 11/02/09 [facility])  . NASAL SINUS SURGERY    . OOPHORECTOMY    . RADIOLOGY WITH ANESTHESIA  02/26/2012   Procedure: RADIOLOGY WITH ANESTHESIA;  Surgeon: [name], MD;  Location: [place];  Service: Radiology;  Laterality: N/A;  . REVERSE SHOULDER ARTHROPLASTY Right 06/27/2013   Procedure: REVERSE RIGHT TOTAL SHOULDER ARTHROPLASTY;  Surgeon: [name], MD;  Location: [place];  Service: Orthopedics;  Laterality: Right;  . SHOULDER ARTHROSCOPY WITH ROTATOR CUFF REPAIR AND SUBACROMIAL DECOMPRESSION  2007   left  . TONSILLECTOMY  adnoids    reports that she has never smoked. She has never used smokeless tobacco. She reports current alcohol use of about 2.0 standard drinks of alcohol per week. She reports that she does not use drugs. family history includes Breast cancer in her paternal grandmother; Colon cancer in her paternal grandfather and another family member; Colon cancer (age of onset: 35) in her father; Diabetes in her father; Heart disease in her father; Rectal cancer in her father; Thyroid disease in her mother. Allergies  Allergen Reactions  . Morphine And Related Rash  . Adhesive [Tape] Rash and Other (See Comments)    Redness, blisters, skin peeling off.      Outpatient Encounter Medications as of 03/11/2019  Medication Sig  . ALPRAZolam (XANAX) 0.25 MG tablet Take 1 tablet (0.25 mg total) by mouth 2 (two) times daily as needed for anxiety.  Marland Kitchen amLODipine (NORVASC) 10 MG tablet TAKE 1 TABLET BY MOUTH EVERY DAY  . buPROPion (WELLBUTRIN XL) 150 MG 24 hr tablet TAKE 3 TABLETS (450 MG TOTAL) BY MOUTH DAILY. (Patient taking differently: Take 300 mg by mouth daily. )  . cyanocobalamin (,VITAMIN B-12,) 1000  MCG/ML injection Inject 1 mL (1,000 mcg total) into the muscle once a week.  . famciclovir (FAMVIR) 500 MG tablet Take 2 tablets (1,000 mg total) by mouth 2 (two) times daily.  . fluticasone (FLONASE) 50 MCG/ACT nasal spray USE 2 SPRAYS IN EACH NOSTRIL DAILY AS NEEDED  . HYDROcodone-acetaminophen (NORCO) 10-325 MG tablet Take 1 tablet by mouth every 6 (six) hours as needed for severe pain.  . metoprolol succinate (TOPROL-XL) 25 MG 24 hr tablet TAKE 1 TABLET BY MOUTH EVERY DAY  . naloxone (NARCAN) 0.4 MG/ML injection Inject 1 mL (0.4 mg total) into the vein as needed.  Marland Kitchen omeprazole (PRILOSEC) 40 MG capsule TAKE 1 CAPSULE BY MOUTH TWICE A DAY  . pantoprazole (PROTONIX) 40 MG tablet TAKE 1 TABLET BY MOUTH TWICE A DAY  . sucralfate (CARAFATE) 1 g tablet Take 1 tablet (1 g total) by mouth 4 (four) times daily -  with meals and at bedtime.  . Syringe/Needle, Disp, (SYRINGE 3CC/25GX1") 25G X 1" 3 ML MISC Use for b12 injections  . terbinafine (LAMISIL) 250 MG tablet Take 1 tablet (250 mg total) by mouth daily.  Marland Kitchen zolpidem (AMBIEN) 10 MG tablet TAKE 1 TABLET (10 MG TOTAL) BY  MOUTH AT BEDTIME AS NEEDED. FOR SLEEP  . [DISCONTINUED] ferrous sulfate 325 (65 FE) MG EC tablet Take 1 tablet (325 mg total) by mouth daily after breakfast.  . [DISCONTINUED] metFORMIN (GLUCOPHAGE) 500 MG tablet Take 500 mg by mouth 2 (two) times daily with a meal.     Facility-Administered Encounter Medications as of 03/11/2019  Medication  . 0.9 %  sodium chloride infusion     REVIEW OF SYSTEMS:  Gen: Denies fever, sweats or chills. No weight loss.  CV: Denies chest pain, palpitations or edema. Resp: Denies cough, shortness of breath of hemoptysis.  GI: Denies heartburn, dysphagia, stomach or lower abdominal pain. No diarrhea or constipation.  GU : Denies urinary burning, blood in urine, increased urinary frequency or incontinence. MS: Denies joint pain, muscles aches or weakness. Derm: Denies rash, itchiness, skin lesions  or unhealing ulcers. Psych: Denies depression, anxiety, memory loss, suicidal ideation and confusion. Heme: Denies bruising, bleeding. Neuro:  Denies headaches, dizziness or paresthesias. Endo:  Denies any problems with DM, thyroid or adrenal function.  PHYSICAL EXAM: BP 100/60   Pulse 63   Temp 97.6 F (36.4 C)   Ht 5\' 6"  (1.676 m)   Wt 117 lb (53.1 kg)   BMI 18.88 kg/m  General: Thin 64 year old female in no acute distress. Head: Normocephalic and atraumatic. Eyes:  Sclerae non-icteric, conjunctive pink. Ears: Normal auditory acuity. Mouth: Dentition intact. No ulcers or lesions.  Neck: Supple, no lymphadenopathy or thyromegaly.  Lungs: Clear bilaterally to auscultation without wheezes, crackles or rhonchi. Heart: Regular rate and rhythm. No murmur, rub or gallop appreciated.  Abdomen: Soft, non distended. Moderate RUQ and epigastric tenderness.  No masses. No hepatosplenomegaly. Normoactive bowel sounds x 4 quadrants.  Rectal: No external hemorrhoids or fissures. No stool or melena in the rectal vault to guaiac. No mass. Musculoskeletal: Symmetrical with no gross deformities. Skin: Pale. Warm and dry. No rash or lesions on visible extremities. Extremities: No edema. Neurological: Alert oriented x 4, no focal deficits.  Psychological:  Alert and cooperative. Normal mood and affect.  ASSESSMENT AND PLAN:  11.  64 year old female with GERD and epigastric pain. Pt reports hx of H. Pylori (not identified on EGDs in 2014 or 2018).  She was inadvertently on 2 PPIs bid for the past year. Less likely PUD, possible atrophic gastritis or recurrence of H. Pylori.  -EGD benefits and risks discussed including risk with sedation, risk of bleeding, perforation and infection -? Check gastric ph at time of EGD -CBC, CMP, CRP, amylase, lipase, H. Pylori IgG ab.  -Dicyclomine 10mg  1 po tid PRN abdominal pain -Florastor probiotic 1 po bid -Continue Pantoprazole 40mg  po bid and Carafate for  now -Discussed scheduling an abd/pelvic CT if her abdominal pain worsens, await the above lab results. -Patient to call our office if her symptoms worsen -Follow up in office 2 weeks after EGD completed  2. Questionable melena, black stools associated with Pepto bismal use. No stool to guaiac on rectal exam today. -Do not take any further Pepto -Heme slides, patient to complete at home  -see plan in #1  3. History of tubular adenomatous polyp. Significant family history of colon cancer. -Next colonoscopy due 06/2021   ADDENDUM: Further review of the patient's EGD 04/18/2012 was negative for H. Pylori, however,  KOH prep of esophageal brushings was positive for budding yeast.  She was treated with Diflucan x 5 days and her symptoms resolved. I called the patient and discussed her current symptoms are  most likely due to candidiasis esophagitis. I will empirically prescribe Nystatin 100,000 u/ml 1 tsp qid x 2 weeks. Hold Carafate. Continue Pantoprazole 40mg  bid for now. She denies taking any recent antibiotic or steroid preparations. No hx of DM. I will consult with Dr. Hilarie Fredrickson to verify if she should proceed with EGD 03/17/2019 as scheduled or defer.   CC:  [name], MD

## 2019-03-11 ENCOUNTER — Encounter: Payer: Self-pay | Admitting: Nurse Practitioner

## 2019-03-11 ENCOUNTER — Other Ambulatory Visit (INDEPENDENT_AMBULATORY_CARE_PROVIDER_SITE_OTHER): Payer: BC Managed Care – PPO

## 2019-03-11 ENCOUNTER — Ambulatory Visit: Payer: BC Managed Care – PPO | Admitting: Nurse Practitioner

## 2019-03-11 ENCOUNTER — Other Ambulatory Visit: Payer: Self-pay

## 2019-03-11 ENCOUNTER — Encounter: Payer: Self-pay | Admitting: Internal Medicine

## 2019-03-11 ENCOUNTER — Other Ambulatory Visit: Payer: Self-pay | Admitting: Nurse Practitioner

## 2019-03-11 ENCOUNTER — Telehealth: Payer: Self-pay | Admitting: General Surgery

## 2019-03-11 VITALS — BP 100/60 | HR 63 | Temp 97.6°F | Ht 66.0 in | Wt 117.0 lb

## 2019-03-11 DIAGNOSIS — K219 Gastro-esophageal reflux disease without esophagitis: Secondary | ICD-10-CM

## 2019-03-11 DIAGNOSIS — Z86010 Personal history of colonic polyps: Secondary | ICD-10-CM

## 2019-03-11 DIAGNOSIS — Z8619 Personal history of other infectious and parasitic diseases: Secondary | ICD-10-CM | POA: Diagnosis not present

## 2019-03-11 DIAGNOSIS — R630 Anorexia: Secondary | ICD-10-CM | POA: Diagnosis not present

## 2019-03-11 DIAGNOSIS — R1013 Epigastric pain: Secondary | ICD-10-CM | POA: Diagnosis not present

## 2019-03-11 DIAGNOSIS — Z8371 Family history of colonic polyps: Secondary | ICD-10-CM

## 2019-03-11 DIAGNOSIS — Z01818 Encounter for other preprocedural examination: Secondary | ICD-10-CM

## 2019-03-11 LAB — CBC WITH DIFFERENTIAL/PLATELET
Basophils Absolute: 0.1 10*3/uL (ref 0.0–0.1)
Basophils Relative: 1 % (ref 0.0–3.0)
Eosinophils Absolute: 0 10*3/uL (ref 0.0–0.7)
Eosinophils Relative: 0.3 % (ref 0.0–5.0)
HCT: 37.1 % (ref 36.0–46.0)
Hemoglobin: 12.5 g/dL (ref 12.0–15.0)
Lymphocytes Relative: 18.6 % (ref 12.0–46.0)
Lymphs Abs: 1.2 10*3/uL (ref 0.7–4.0)
MCHC: 33.6 g/dL (ref 30.0–36.0)
MCV: 96.1 fl (ref 78.0–100.0)
Monocytes Absolute: 0.5 10*3/uL (ref 0.1–1.0)
Monocytes Relative: 7.9 % (ref 3.0–12.0)
Neutro Abs: 4.7 10*3/uL (ref 1.4–7.7)
Neutrophils Relative %: 72.2 % (ref 43.0–77.0)
Platelets: 347 10*3/uL (ref 150.0–400.0)
RBC: 3.86 Mil/uL — ABNORMAL LOW (ref 3.87–5.11)
RDW: 12.8 % (ref 11.5–15.5)
WBC: 6.6 10*3/uL (ref 4.0–10.5)

## 2019-03-11 LAB — COMPREHENSIVE METABOLIC PANEL
ALT: 11 U/L (ref 0–35)
AST: 14 U/L (ref 0–37)
Albumin: 4.5 g/dL (ref 3.5–5.2)
Alkaline Phosphatase: 47 U/L (ref 39–117)
BUN: 14 mg/dL (ref 6–23)
CO2: 24 mEq/L (ref 19–32)
Calcium: 9.5 mg/dL (ref 8.4–10.5)
Chloride: 100 mEq/L (ref 96–112)
Creatinine, Ser: 1.29 mg/dL — ABNORMAL HIGH (ref 0.40–1.20)
GFR: 41.72 mL/min — ABNORMAL LOW (ref >60.00)
Glucose, Bld: 89 mg/dL (ref 70–99)
Potassium: 3.2 mEq/L — ABNORMAL LOW (ref 3.5–5.1)
Sodium: 135 mEq/L (ref 135–145)
Total Bilirubin: 0.3 mg/dL (ref 0.2–1.2)
Total Protein: 6.9 g/dL (ref 6.0–8.3)

## 2019-03-11 LAB — H. PYLORI ANTIBODY, IGG: H Pylori IgG: NEGATIVE

## 2019-03-11 LAB — LIPASE: Lipase: 54 U/L (ref 11.0–59.0)

## 2019-03-11 LAB — C-REACTIVE PROTEIN: CRP: <1 mg/dL (ref 0.5–20.0)

## 2019-03-11 LAB — AMYLASE: Amylase: 38 U/L (ref 27–131)

## 2019-03-11 MED ORDER — NYSTATIN 100000 UNIT/ML MT SUSP: 5.0000 mL | Freq: Four times a day (QID) | OROMUCOSAL | 0 refills | Status: DC

## 2019-03-11 MED ORDER — DICYCLOMINE HCL 10 MG PO CAPS: ORAL_CAPSULE | ORAL | 0 refills | Status: DC

## 2019-03-11 NOTE — Telephone Encounter (Signed)
I re-reviewed her EGD 2014 pathology report. No H. Pylori but KOH Prep of esophageal brushings showed budding yeast tx. Erosive candidiasis esophagitis was treated with Diflucan at that time. I called the patient back and discussed I did not find any evidence of H. Pylori in her records but I now see she had yeast. She then stated she forgot it was a yeast infection and she thought it was H. Pylori bacteria infection. She denies any recent antibiotic use or steroids oral, inhaled or nasal spray use. She will hold Carafate. Continue Pantoprazole 40mg  p bid. Start Nystatin 100,000 iu/ml 1 tsp qid x 14 days. See Addendum to office visit today.

## 2019-03-11 NOTE — Progress Notes (Signed)
nyst

## 2019-03-11 NOTE — Telephone Encounter (Signed)
I called the patient. As we discussed at the time of her ov today, she is on Pantoprazole 40mg  bid, her PCP prescribed Caragate 1gm po qid which she started 3 or 4 days ago. I did NOT advise her to take Hydrocodone for her esophageal burning. I advised her to take Dicyclomine 10mg  po tid PRN. Await her lab results. She stated "this is the same burning pain I had when diagnosed with  H. Pylori".  H pylori ab ordered per her request at the time of her ov today. A repeat EGD is scheduled for Mon 1/25 with Dr. Hilarie Fredrickson. She denies having any shortness of breath, dizziness, palpitations or prior back issues. No history of heart dz. I advised the patient if her esophageal burning worsens to present to the urgent care or ED for a chest xay and EKG. She verbalized understanding these instructions.

## 2019-03-11 NOTE — Telephone Encounter (Signed)
The patient called back stating that she is concerned with the amount of burning she is having in her esophagus and doesn't believe what was prescribed will help with her pain.She stated she also is on hydrocodone and wanted to know if this would help with her pain. Explained to her that it probably will not since it is a burning sensation in her esophagus and she should take her carafate that was prescribed. The patient stated carafate does not help and she will not take it.   Please contact the patient in regards to her concerns.

## 2019-03-11 NOTE — Patient Instructions (Addendum)
If you are age 64 or older, your body mass index should be between 23-30. Your Body mass index is 18.88 kg/m. If this is out of the aforementioned range listed, please consider follow up with your Primary Care Provider.  If you are age 39 or younger, your body mass index should be between 19-25. Your Body mass index is 18.88 kg/m. If this is out of the aformentioned range listed, please consider follow up with your Primary Care Provider.    Please start using Florastor an over the counter probiotic you can pick up at any pharmacy.  Your provider has requested that you have lab work today. We ask that you go to our [facility] office at [address], [place], [place] [address]. Please enter through the main entrance and go to the elevator.  Press "B" on the elevator. The lab is located at the first door on the left as you exit the elevator.  Due to recent changes in healthcare laws, you may see the results of your imaging and laboratory studies on MyChart before your provider has had a chance to review them.  We understand that in some cases there may be results that are confusing or concerning to you. Not all laboratory results come back in the same time frame and the provider may be waiting for multiple results in order to interpret others.  Please give [place] 48 hours in order for your provider to thoroughly review all the results before contacting the office for clarification of your results.   Please call to schedule a two week follow up after your endoscopy.  Thank you for choosing me and [facility]

## 2019-03-11 NOTE — Progress Notes (Signed)
I would recommend given her history that we empirically treat for Candida esophagitis. Fluconazole 400 mg x 1 day, 200 mg x 13 days Nystatin can be discontinued If symptoms persist after 2 weeks then I would recommend we proceed to EGD at that time  Thank you

## 2019-03-12 ENCOUNTER — Telehealth: Payer: Self-pay | Admitting: General Surgery

## 2019-03-12 ENCOUNTER — Telehealth: Payer: Self-pay | Admitting: Nurse Practitioner

## 2019-03-12 DIAGNOSIS — B3781 Candidal esophagitis: Secondary | ICD-10-CM

## 2019-03-12 MED ORDER — HYDROCODONE-ACETAMINOPHEN 10-325 MG PO TABS: 1.0000 | ORAL_TABLET | Freq: Four times a day (QID) | ORAL | 0 refills | Status: DC | PRN

## 2019-03-12 MED ORDER — FLUCONAZOLE 200 MG PO TABS: 200.0000 mg | ORAL_TABLET | Freq: Every day | ORAL | 0 refills | Status: DC

## 2019-03-12 NOTE — Telephone Encounter (Signed)
Bre, pls call the patient and let her know I consulted with Dr. Hilarie Fredrickson following her appt yesterday and due to her hx of candidiasis esophagitis in 2014 he would prefer she take Diflucan. So have her stop the Nystatin.  Pls send RX for Fluconazole 200mg  Take 2 tabs po x 1 day then 200mg  1 tab po daily x 13 days. # 15. no refills. thx   Cancel he EGD scheduled 1/25. If her symptoms persist after 2 weeks then an EGD will be done.  Pls note if she has any questions I am out of the office until Monday 1/25. Thank you.

## 2019-03-12 NOTE — Telephone Encounter (Signed)
Notified the patient verbatim regarding her labwork. The patient verbalized understanding.

## 2019-03-12 NOTE — Telephone Encounter (Signed)
Called and spoke with patient-patient informed of provider's  recommendations; patient is agreeable with plan of care and verified pharmacy; RX sent to pharmacy; COVID screening and EGD appts cancelled; Patient verbalized understanding of information/instructions;  Patient was advised to call the office at [phone] if questions/concerns arise;

## 2019-03-12 NOTE — Telephone Encounter (Signed)
-----   Message from Noralyn Pick, NP sent at 03/12/2019  2:17 PM EST ----- Bre, pls contact Ms. Cleckler, her potassium is low and her creatinine level is little elevated, pls make sure pt is hydrating well and she should follow up with her pcp regarding these results. thx

## 2019-03-17 ENCOUNTER — Encounter: Payer: BC Managed Care – PPO | Admitting: Internal Medicine

## 2019-03-19 ENCOUNTER — Ambulatory Visit: Payer: BC Managed Care – PPO | Admitting: Physician Assistant

## 2019-03-20 ENCOUNTER — Telehealth: Payer: Self-pay | Admitting: Nurse Practitioner

## 2019-03-20 DIAGNOSIS — Z01818 Encounter for other preprocedural examination: Secondary | ICD-10-CM

## 2019-03-20 DIAGNOSIS — Z8619 Personal history of other infectious and parasitic diseases: Secondary | ICD-10-CM

## 2019-03-20 DIAGNOSIS — R1013 Epigastric pain: Secondary | ICD-10-CM

## 2019-03-20 DIAGNOSIS — R630 Anorexia: Secondary | ICD-10-CM

## 2019-03-20 NOTE — Telephone Encounter (Signed)
Bre, pls call pt to schedule an EGD with Dr. Hilarie Fredrickson asap as she continues to have sx. Pls let me know when done thx.

## 2019-03-20 NOTE — Telephone Encounter (Signed)
Called and spoke with patient-patent has been scheduled for COVID on 03/21/2019 at 9:40 am; patient has also been scheduled for EGD at [facility] on 03/25/2019 at 3:30 pm; instructions have been sent to the patient via MyChart per her request; Patient advised to call back to the office at [phone] should questions/concerns arise;  Patient verbalized understanding of information/instructions;

## 2019-03-21 ENCOUNTER — Telehealth: Payer: Self-pay | Admitting: Nurse Practitioner

## 2019-03-21 ENCOUNTER — Other Ambulatory Visit: Payer: Self-pay

## 2019-03-21 ENCOUNTER — Other Ambulatory Visit (INDEPENDENT_AMBULATORY_CARE_PROVIDER_SITE_OTHER): Payer: BC Managed Care – PPO

## 2019-03-21 ENCOUNTER — Ambulatory Visit
Admission: RE | Admit: 2019-03-21 | Discharge: 2019-03-21 | Disposition: A | Discharge status: Home / Self Care | Payer: BC Managed Care – PPO | Source: Ambulatory Visit | Attending: Family Medicine | Admitting: Family Medicine

## 2019-03-21 ENCOUNTER — Other Ambulatory Visit: Payer: Self-pay | Admitting: Internal Medicine

## 2019-03-21 ENCOUNTER — Ambulatory Visit (INDEPENDENT_AMBULATORY_CARE_PROVIDER_SITE_OTHER): Payer: Self-pay

## 2019-03-21 DIAGNOSIS — R630 Anorexia: Secondary | ICD-10-CM

## 2019-03-21 DIAGNOSIS — M5090 Cervical disc disorder, unspecified, unspecified cervical region: Secondary | ICD-10-CM

## 2019-03-21 DIAGNOSIS — R1013 Epigastric pain: Secondary | ICD-10-CM

## 2019-03-21 DIAGNOSIS — Z8619 Personal history of other infectious and parasitic diseases: Secondary | ICD-10-CM

## 2019-03-21 DIAGNOSIS — Z1159 Encounter for screening for other viral diseases: Secondary | ICD-10-CM

## 2019-03-21 DIAGNOSIS — R140 Abdominal distension (gaseous): Secondary | ICD-10-CM

## 2019-03-21 DIAGNOSIS — M542 Cervicalgia: Secondary | ICD-10-CM | POA: Diagnosis not present

## 2019-03-21 LAB — HEMOCCULT SLIDES (X 3 CARDS)
Fecal Occult Blood: NEGATIVE
OCCULT 1: NEGATIVE
OCCULT 2: NEGATIVE
OCCULT 3: NEGATIVE
OCCULT 4: NEGATIVE
OCCULT 5: NEGATIVE

## 2019-03-21 MED ORDER — IOPAMIDOL (ISOVUE-M 300) INJECTION 61%
1.0000 mL | Freq: Once | INTRAMUSCULAR | Status: AC | PRN
  Administered 2019-03-21: 09:00:00 1 mL via INTRA_ARTICULAR

## 2019-03-21 MED ORDER — DEXAMETHASONE SODIUM PHOSPHATE 4 MG/ML IJ SOLN
6.0000 mg | Freq: Once | INTRAMUSCULAR | Status: AC
  Administered 2019-03-21: 6 mg via INTRAVENOUS

## 2019-03-21 NOTE — Telephone Encounter (Signed)
Patient returned call to the office - she has complaints of feeling really fatigued still and stool is still dark and tar-like in consistency; patient is "tired of feeling this way" -patient is scheduled to have an EGD on 03/25/2019- Patient is requesting if there is anything that can be done in the meantime Please advise

## 2019-03-21 NOTE — Telephone Encounter (Signed)
Left message for patient to call back to the office;  

## 2019-03-21 NOTE — Telephone Encounter (Signed)
I called the patient. She is no longer taking pepto bismal but she is passing black like tar soft sticky not liquid/loose stool twice daily. She is taking Pantoprazole 40mg  bid. I advised her to restart carafate 1gm po qid. Repeat CBC on Monday 2/2. To ED if her sx worsen over the weekend. EGD 2/2.

## 2019-03-22 NOTE — Telephone Encounter (Signed)
See other message dated 03/21/2019. I spoke with the patient via the telephone 1/29. When questioned, she clarified he stools are soft solid but sticky and are the color of black tar 2 x daily. No loose black tarry stools. No further Pepto bismal and stools still black in color. She is scheduled for an EGD with Dr. Hilarie Fredrickson on 03/25/2019. She is taking Pantoprazole 40mg  bid. I advised her to restart Carafate 1 g po qid and to go to the local ER if she develops cp, sob, dizziness, severe abd pain or profound fatigue. Repeat CBC on Monday 03/23/2018. Bre RN to facilitate lab order.

## 2019-03-24 LAB — SARS CORONAVIRUS 2 (TAT 6-24 HRS): SARS Coronavirus 2: NEGATIVE

## 2019-03-24 MED ORDER — SUCRALFATE 1 G PO TABS: 1.0000 g | ORAL_TABLET | Freq: Three times a day (TID) | ORAL | 0 refills | Status: DC

## 2019-03-24 NOTE — Telephone Encounter (Signed)
RX for Carafate refilled and CBC order placed in Epic per provider's request;   Left message for patient to be made aware of need for lab work and of RX being sent to pharmacy listed in chart;

## 2019-03-25 ENCOUNTER — Ambulatory Visit (AMBULATORY_SURGERY_CENTER): Payer: BC Managed Care – PPO | Admitting: Internal Medicine

## 2019-03-25 ENCOUNTER — Other Ambulatory Visit (INDEPENDENT_AMBULATORY_CARE_PROVIDER_SITE_OTHER): Payer: BC Managed Care – PPO

## 2019-03-25 ENCOUNTER — Other Ambulatory Visit: Payer: Self-pay

## 2019-03-25 ENCOUNTER — Encounter: Payer: Self-pay | Admitting: Internal Medicine

## 2019-03-25 VITALS — BP 100/64 | HR 52 | Temp 97.5°F | Resp 10 | Ht 66.0 in | Wt 117.0 lb

## 2019-03-25 DIAGNOSIS — R1013 Epigastric pain: Secondary | ICD-10-CM | POA: Diagnosis not present

## 2019-03-25 DIAGNOSIS — K2950 Unspecified chronic gastritis without bleeding: Secondary | ICD-10-CM | POA: Diagnosis not present

## 2019-03-25 DIAGNOSIS — K2970 Gastritis, unspecified, without bleeding: Secondary | ICD-10-CM

## 2019-03-25 DIAGNOSIS — K219 Gastro-esophageal reflux disease without esophagitis: Secondary | ICD-10-CM

## 2019-03-25 DIAGNOSIS — K228 Other specified diseases of esophagus: Secondary | ICD-10-CM | POA: Diagnosis not present

## 2019-03-25 DIAGNOSIS — R140 Abdominal distension (gaseous): Secondary | ICD-10-CM | POA: Diagnosis not present

## 2019-03-25 DIAGNOSIS — R109 Unspecified abdominal pain: Secondary | ICD-10-CM | POA: Diagnosis not present

## 2019-03-25 DIAGNOSIS — K921 Melena: Secondary | ICD-10-CM

## 2019-03-25 DIAGNOSIS — K317 Polyp of stomach and duodenum: Secondary | ICD-10-CM

## 2019-03-25 LAB — CBC WITH DIFFERENTIAL/PLATELET
Basophils Absolute: 0.1 10*3/uL (ref 0.0–0.1)
Basophils Relative: 1.1 % (ref 0.0–3.0)
Eosinophils Absolute: 0.1 10*3/uL (ref 0.0–0.7)
Eosinophils Relative: 1.6 % (ref 0.0–5.0)
HCT: 34.3 % — ABNORMAL LOW (ref 36.0–46.0)
Hemoglobin: 11.9 g/dL — ABNORMAL LOW (ref 12.0–15.0)
Lymphocytes Relative: 30.2 % (ref 12.0–46.0)
Lymphs Abs: 1.5 10*3/uL (ref 0.7–4.0)
MCHC: 34.8 g/dL (ref 30.0–36.0)
MCV: 94.5 fl (ref 78.0–100.0)
Monocytes Absolute: 0.6 10*3/uL (ref 0.1–1.0)
Monocytes Relative: 11.7 % (ref 3.0–12.0)
Neutro Abs: 2.7 10*3/uL (ref 1.4–7.7)
Neutrophils Relative %: 55.4 % (ref 43.0–77.0)
Platelets: 352 10*3/uL (ref 150.0–400.0)
RBC: 3.63 Mil/uL — ABNORMAL LOW (ref 3.87–5.11)
RDW: 12.3 % (ref 11.5–15.5)
WBC: 4.9 10*3/uL (ref 4.0–10.5)

## 2019-03-25 MED ORDER — OMEPRAZOLE 40 MG PO CPDR: 40.0000 mg | DELAYED_RELEASE_CAPSULE | Freq: Every day | ORAL | 2 refills | Status: DC

## 2019-03-25 MED ORDER — SODIUM CHLORIDE 0.9 % IV SOLN: 500.0000 mL | Freq: Once | INTRAVENOUS | Status: DC

## 2019-03-25 NOTE — Progress Notes (Addendum)
Per Dr. Hilarie Fredrickson, Pt to take OTC Florastor 250 mg twice daily.  Maw  Pt c/o upper lip stinging.  Tiny place middle of lip and tiny place to to right side of upper lip.  Per Dr. Hilarie Fredrickson tiny place, use Ice to area. Maw  V.O from Dr. Hilarie Fredrickson tp discontinue PANTOPRAZOLE and send rx in for OMEPRAZOLE 40 mg po daily #30 refill x2 sent to [facility].  Maw  No problems noted in the recovery room. maw

## 2019-03-25 NOTE — Op Note (Signed)
[facility] Patient Name: Adrienne Robinson Procedure Date: 03/25/2019 3:20 PM MRN: [id number] Endoscopist: Jerene Bears , MD Age: 64 Referring MD:  Date of Birth: 1955/04/17 Gender: Female Account #: [id number] Procedure:                Upper GI endoscopy Indications:              Epigastric abdominal pain, Gastro-esophageal reflux                            disease, history of Candida esophagitis now                            finishing empiric treatment with fluconazole, black                            stools Medicines:                Monitored Anesthesia Care Procedure:                Pre-Anesthesia Assessment:                           - Prior to the procedure, a History and Physical                            was performed, and patient medications and                            allergies were reviewed. The patient's tolerance of                            previous anesthesia was also reviewed. The risks                            and benefits of the procedure and the sedation                            options and risks were discussed with the patient.                            All questions were answered, and informed consent                            was obtained. Prior Anticoagulants: The patient has                            taken no previous anticoagulant or antiplatelet                            agents. ASA Grade Assessment: II - A patient with                            mild systemic disease. After reviewing the risks  and benefits, the patient was deemed in                            satisfactory condition to undergo the procedure.                           After obtaining informed consent, the endoscope was                            passed under direct vision. Throughout the                            procedure, the patient's blood pressure, pulse, and                            oxygen saturations were monitored continuously. The                            Endoscope was introduced through the mouth, and                            advanced to the third part of duodenum. The upper                            GI endoscopy was accomplished without difficulty.                            The patient tolerated the procedure well. Scope In: Scope Out: Findings:                 A single 4 mm polyp with no bleeding was found 25                            cm from the incisors. The polyp was removed with a                            cold biopsy forceps. Resection and retrieval were                            complete.                           The exam of the esophagus was otherwise normal. No                            evidence of esophagitis.                           Diffuse moderately erythematous mucosa without                            bleeding was found in the gastric antrum. Biopsies                            were taken  with a cold forceps for histology and                            Helicobacter pylori testing.                           The exam of the stomach was otherwise normal.                           The examined duodenum was normal. Complications:            No immediate complications. Estimated Blood Loss:     Estimated blood loss was minimal. Impression:               - Esophageal polyp was found. Resected and                            retrieved.                           - Erythematous mucosa in the antrum. Biopsied.                           - Normal examined duodenum. Recommendation:           - Patient has a contact number available for                            emergencies. The signs and symptoms of potential                            delayed complications were discussed with the                            patient. Return to normal activities tomorrow.                            Written discharge instructions were provided to the                            patient.                           -  Resume previous diet.                           - Continue present medications.                           - Await pathology results. Jerene Bears, MD 03/25/2019 3:51:37 PM This report has been signed electronically.

## 2019-03-25 NOTE — Progress Notes (Signed)
Called to room to assist during endoscopic procedure.  Patient ID and intended procedure confirmed with present staff. Received instructions for my participation in the procedure from the performing physician.  

## 2019-03-25 NOTE — Progress Notes (Signed)
A/ox3, pleased with MAC, report to RN 

## 2019-03-25 NOTE — Progress Notes (Signed)
Pt's states no medical or surgical changes since previsit or office visit. 

## 2019-03-25 NOTE — Patient Instructions (Signed)
YOU HAD AN ENDOSCOPIC PROCEDURE TODAY AT [facility]:   Refer to the procedure report that was given to you for any specific questions about what was found during the examination.  If the procedure report does not answer your questions, please call your gastroenterologist to clarify.  If you requested that your care partner not be given the details of your procedure findings, then the procedure report has been included in a sealed envelope for you to review at your convenience later.  YOU SHOULD EXPECT: Some feelings of bloating in the abdomen. Passage of more gas than usual.  Walking can help get rid of the air that was put into your GI tract during the procedure and reduce the bloating. If you had a lower endoscopy (such as a colonoscopy or flexible sigmoidoscopy) you may notice spotting of blood in your stool or on the toilet paper. If you underwent a bowel prep for your procedure, you may not have a normal bowel movement for a few days.  Please Note:  You might notice some irritation and congestion in your nose or some drainage.  This is from the oxygen used during your procedure.  There is no need for concern and it should clear up in a day or so.  SYMPTOMS TO REPORT IMMEDIATELY:   Following upper endoscopy (EGD)  Vomiting of blood or coffee ground material  New chest pain or pain under the shoulder blades  Painful or persistently difficult swallowing  New shortness of breath  Fever of 100F or higher  Black, tarry-looking stools  For urgent or emergent issues, a gastroenterologist can be reached at any hour by calling [phone].   DIET:  We do recommend a small meal at first, but then you may proceed to your regular diet.  Drink plenty of fluids but you should avoid alcoholic beverages for 24 hours.  ACTIVITY:  You should plan to take it easy for the rest of today and you should NOT DRIVE or use heavy machinery until tomorrow (because of the sedation medicines used  during the test).    FOLLOW UP: Our staff will call the number listed on your records 48-72 hours following your procedure to check on you and address any questions or concerns that you may have regarding the information given to you following your procedure. If we do not reach you, we will leave a message.  We will attempt to reach you two times.  During this call, we will ask if you have developed any symptoms of COVID 19. If you develop any symptoms (ie: fever, flu-like symptoms, shortness of breath, cough etc.) before then, please call [phone].  If you test positive for Covid 19 in the 2 weeks post procedure, please call and report this information to [place].    If any biopsies were taken you will be contacted by phone or by letter within the next 1-3 weeks.  Please call us at [phone] if you have not heard about the biopsies in 3 weeks.    SIGNATURES/CONFIDENTIALITY: You and/or your care partner have signed paperwork which will be entered into your electronic medical record.  These signatures attest to the fact that that the information above on your After Visit Summary has been reviewed and is understood.  Full responsibility of the confidentiality of this discharge information lies with you and/or your care-partner.     Handout was given to you on Gastritis. You may resume your current medications today. Await biopsy results. Please call  if any questions or concerns.   

## 2019-03-27 ENCOUNTER — Telehealth: Payer: Self-pay

## 2019-03-27 ENCOUNTER — Other Ambulatory Visit: Payer: Self-pay | Admitting: Nurse Practitioner

## 2019-03-27 NOTE — Telephone Encounter (Signed)
  Follow up Call-  Call back number 03/25/2019 07/10/2016  Post procedure Call Back phone  # [phone]  Permission to leave phone message Yes Yes  Some recent data might be hidden     Patient questions:  Do you have a fever, pain , or abdominal swelling? Yes Pain Score  2 * patient c/o soreness she believes is due to manipulation required during the procedure.  Patient agrees to call the on call number if soreness doesn't subside or gets worse.  Have you tolerated food without any problems? Yes.    Have you been able to return to your normal activities? Yes.    Do you have any questions about your discharge instructions: Diet   No. Medications  No. Follow up visit  No.  Do you have questions or concerns about your Care? No.  Actions: * If pain score is 4 or above: No action needed, pain <4.  1. Have you developed a fever since your procedure? no  2.   Have you had an respiratory symptoms (SOB or cough) since your procedure? no  3.   Have you tested positive for COVID 19 since your procedure no  4.   Have you had any family members/close contacts diagnosed with the COVID 19 since your procedure?  no   If yes to any of these questions please route to Joylene John, RN and Alphonsa Gin, Therapist, sports.

## 2019-04-02 ENCOUNTER — Other Ambulatory Visit: Payer: Self-pay | Admitting: Internal Medicine

## 2019-04-02 ENCOUNTER — Other Ambulatory Visit: Payer: Self-pay

## 2019-04-02 DIAGNOSIS — R140 Abdominal distension (gaseous): Secondary | ICD-10-CM

## 2019-04-02 DIAGNOSIS — R1013 Epigastric pain: Secondary | ICD-10-CM

## 2019-04-02 NOTE — Telephone Encounter (Signed)
Let patient know that I do recommend we proceed with CT scan of the abdomen and pelvis with IV contrast to evaluate abdominal pain, and bloating symptom  Pathology results show that the polyp from the esophagus is benign.  Not precancerous. Stomach biopsies were also rather unremarkable without evidence of abnormal cells or infection.  We are proceeding with CT, she can continue probiotic which I recommended after her upper endoscopy which was Florastor 250 mg twice daily for 1 to 2 weeks then once daily thereafter

## 2019-04-07 ENCOUNTER — Inpatient Hospital Stay: Admission: RE | Admit: 2019-04-07 | Payer: BC Managed Care – PPO | Source: Ambulatory Visit

## 2019-04-09 ENCOUNTER — Other Ambulatory Visit: Payer: Self-pay | Admitting: Internal Medicine

## 2019-04-18 ENCOUNTER — Other Ambulatory Visit: Payer: Self-pay | Admitting: Internal Medicine

## 2019-04-18 ENCOUNTER — Ambulatory Visit (INDEPENDENT_AMBULATORY_CARE_PROVIDER_SITE_OTHER)
Admission: RE | Admit: 2019-04-18 | Discharge: 2019-04-18 | Disposition: A | Discharge status: Home / Self Care | Payer: BC Managed Care – PPO | Source: Ambulatory Visit | Attending: Internal Medicine | Admitting: Internal Medicine

## 2019-04-18 ENCOUNTER — Other Ambulatory Visit: Payer: Self-pay

## 2019-04-18 DIAGNOSIS — R140 Abdominal distension (gaseous): Secondary | ICD-10-CM | POA: Diagnosis not present

## 2019-04-18 DIAGNOSIS — R1013 Epigastric pain: Secondary | ICD-10-CM

## 2019-04-18 DIAGNOSIS — R109 Unspecified abdominal pain: Secondary | ICD-10-CM | POA: Diagnosis not present

## 2019-04-18 MED ORDER — IOHEXOL 300 MG/ML  SOLN
80.0000 mL | Freq: Once | INTRAMUSCULAR | Status: AC | PRN
  Administered 2019-04-18: 11:00:00 80 mL via INTRAVENOUS

## 2019-04-21 NOTE — Telephone Encounter (Signed)
I spoke to the patient by phone, please see result note from recent CT scan of the abdomen pelvis for details

## 2019-04-22 ENCOUNTER — Telehealth: Payer: Self-pay | Admitting: *Deleted

## 2019-04-22 DIAGNOSIS — R935 Abnormal findings on diagnostic imaging of other abdominal regions, including retroperitoneum: Secondary | ICD-10-CM

## 2019-04-22 NOTE — Telephone Encounter (Signed)
I have scheduled patient for MR enterography of abdomen w/wo contrast (no pelvis needed per Dr Hilarie Fredrickson) on Sunday, 04/27/19 at 11:30 am with 11:00 am arrival at [facility]. Patient should be NPO 4 hours prior to test and should arrive at the Emergency Department and let them know she is there for an outpatient MRI test at which time the tech will come get her.  In addition, patient needs BUN, creatinine prior to MRI as her last labs have exceeded the 6 week limit that she would need in order to suffice for MRI scheduling.

## 2019-04-22 NOTE — Telephone Encounter (Signed)
-----   Message from Jerene Bears, MD sent at 04/21/2019  5:26 PM EST ----- I spoke to the patient by phone today at length regarding her CT scan of the abdomen and pelvis. She has been feeling better.  She is continue daily PPI and is using Bentyl as needed for abdominal pain and pressure. Her bowel movements have been regular of late though she still is having intermittent abdominal discomfort and pressure but overall better than some weeks ago.  The CT shows very low-grade partial obstruction versus ileus type pattern related to an area of enteritis in the ileum.  This raises the question of possible enteritis such as Crohn's disease.  We discussed further evaluation with either enterography or video capsule endoscopy.  I do have concern with video capsule of causing a complete obstruction if there is indeed an area of small intestine which is narrowed.  If we went with video capsule I would recommend a patency capsule before the true capsule endoscopy.  After our discussion we will proceed as follows  --MR enterography to evaluate the abnormal area in the mid to distal small bowel/rule out Crohn's disease (she has just started a new job and today was her first day.  She will certainly accommodate MR enterography but please see if this could be performed late in the afternoon or even evening or even on Saturday or Sunday.  Please contact the patient to help schedule this scan) --Depending on these results we may consider video capsule endoscopy with patency capsule first --She will continue PPI and dicyclomine 3 times daily as needed  Time provided for questions and answers and she thanked me for the call

## 2019-04-23 MED ORDER — LORAZEPAM 1 MG PO TABS: ORAL_TABLET | ORAL | 0 refills | Status: DC

## 2019-04-23 NOTE — Telephone Encounter (Signed)
  Name: Adrienne Robinson, Adrienne Robinson MRN: [id number]  Date: 04/27/2019 Status: Sch  Time: 1:00 PM Length: 60  Visit Type: MR ENTERO ABD W/O W/CM [id number] Copay: $0.00  Provider: [facility] Department: [facility]  Referring Provider: Jerene Bears CSN: [id number]  Notes: mar/provider/arrive@1130a @[facility]  ED/NPO 4/ok w/Lynette/provider aware pt must have labs completed/resulted prior to pt's appt.  Made On: 04/22/2019 1:38 PM By: Levonne Hubert A        Patient has been advised of MR enterography appointment time/date/location as well as to remain NPO 4 hours prior to test. In addition, she is advised to have labwork completed this week at our office prior to her MR appointment this weekend. Patient verbalizes understanding.  Patient notes that she is claustraphobic and wonders if we could give her something to help with her anxiety prior to test. I advised that if we do give her anything, she will need to have a driver to bring her to and from her appointment as we do not want her to drive while she takes a sedating medication. She verbalizes understanding of this and indicates that her sister will be bringing her.

## 2019-04-23 NOTE — Telephone Encounter (Signed)
Ativan 1 mg PO 1 hour before MRI Thanks

## 2019-04-23 NOTE — Telephone Encounter (Signed)
Rx created, please send through [facility]. Patient is advised that Rx sent. She will have her sister take her to and from MRI.

## 2019-04-25 ENCOUNTER — Other Ambulatory Visit (INDEPENDENT_AMBULATORY_CARE_PROVIDER_SITE_OTHER): Payer: BC Managed Care – PPO

## 2019-04-25 ENCOUNTER — Telehealth: Payer: Self-pay | Admitting: Internal Medicine

## 2019-04-25 DIAGNOSIS — R935 Abnormal findings on diagnostic imaging of other abdominal regions, including retroperitoneum: Secondary | ICD-10-CM | POA: Diagnosis not present

## 2019-04-25 LAB — CREATININE, SERUM: Creatinine, Ser: 1.05 mg/dL (ref 0.40–1.20)

## 2019-04-25 LAB — BUN: BUN: 16 mg/dL (ref 6–23)

## 2019-04-27 ENCOUNTER — Ambulatory Visit (HOSPITAL_COMMUNITY): Payer: BC Managed Care – PPO

## 2019-04-28 NOTE — Telephone Encounter (Signed)
-----   Message from Darden Dates sent at 04/25/2019  3:47 PM EST ----- Regarding: MR denied Damita Dunnings,  There are too many open encounters on this pt for me to start another one without someone getting confused. Her MR was denied.  I'm going to fax denial.  They want peer to peer. I have spoken to a nurse in detail about why she needs it.  And, I did say this was MR enterography. I will send to fax [phone]. The appt has been cancelled for now. Thanks, Amy

## 2019-04-28 NOTE — Telephone Encounter (Signed)
I am unsure whom Valma Cava spoke to on 04/25/19 at 4:12 pm regarding working on getting a PA for test as I was previously on the one working on this and nobody notified me there was any problem on this MR enterography by phone or we could have worked on this last week. At any rate, Dr Hilarie Fredrickson has completed a peer to peer review with Dr Sammie Bench and MR enterography has been approved until 10/25/19. Approval # [id number].   I have contacted Marlita in radiology scheduling to reschedule test. Unfortunately, the first available date for reschedule is Friday, 05/02/19 with 12:30 pm arrival for 2:00 pm test at [facility]. I have left a message for patient to call back.

## 2019-04-29 NOTE — Telephone Encounter (Signed)
Hello Adrienne Robinson .Marland Kitchen  This appointment needs to be rescheduled. I just started a new position and I will be working Friday. The appt needs to be scheduled for Sat. or Sun. Please let me know if that will work. Also, you would know better than I if the 1 mg of Xanax will be enough to get me into the MRI equipment. I spoke to one of the staff and they said the machine is semi-open?? I don't know what that is. I have to trust them that it will be alright. I tried a normal MRI once. I barely got into the machine before I was pounding on the button to let me out. I know that I have to have this done. Dr. Hilarie Fredrickson said that it is the only way, so I am more than willing to do my part. I really want to feel better.   I tried to contact you on my way home. I'm sorry I missed you.  Thanks so much!! Adrienne Robinson

## 2019-04-29 NOTE — Telephone Encounter (Signed)
Patient is returning your call.  

## 2019-04-29 NOTE — Telephone Encounter (Signed)
I have spoken to patient who unfortunately due to work is unable to come on Friday for MR enterography of Abdomen (no pelvis per Dr Hilarie Fredrickson). She really needs a Saturday or Sunday appointment. I am told by Vivien Rota in scheduling that no Saturday or Sunday appointments are available at this time for MR Enterography at [facility] or [place]. I have also contacted [facility] who offers MR enterography but they do not have any Saturday or Sunday appointments available until April and the latest they offer these appointments is around 4:30 pm. I advised patient of this and the current plan is to keep the scheduled Friday appointment for now but I will call scheduling tomorrow throughout the day as well as Thursday throughout the day to see if there have been any cancellations where an MR Enterography can be completed. Patient verbalizes understanding of this.

## 2019-04-30 ENCOUNTER — Encounter: Payer: Self-pay | Admitting: *Deleted

## 2019-05-01 ENCOUNTER — Telehealth: Payer: Self-pay | Admitting: Internal Medicine

## 2019-05-01 ENCOUNTER — Other Ambulatory Visit: Payer: Self-pay | Admitting: *Deleted

## 2019-05-01 MED ORDER — DICYCLOMINE HCL 10 MG PO CAPS: ORAL_CAPSULE | ORAL | 2 refills | Status: DC

## 2019-05-01 NOTE — Telephone Encounter (Signed)
Patient advised that unfortunately, there are still no openings for MR enterography this weekend at [facility], [place] or [place], however, I have rescheduled her appointment to Sunday 05/11/19 at 1:00 pm, 1130 am arrival at [facility]. Patient verbalizes understanding of this. She is also advised that I will continue checking through tomorrow afternoon to see if there is a cancellation for this weekend. If so, I will let her know.

## 2019-05-02 ENCOUNTER — Ambulatory Visit (HOSPITAL_COMMUNITY): Payer: BC Managed Care – PPO

## 2019-05-06 ENCOUNTER — Other Ambulatory Visit: Payer: Self-pay | Admitting: Internal Medicine

## 2019-05-06 DIAGNOSIS — U071 COVID-19: Secondary | ICD-10-CM | POA: Diagnosis not present

## 2019-05-07 ENCOUNTER — Other Ambulatory Visit: Payer: Self-pay | Admitting: Internal Medicine

## 2019-05-07 MED ORDER — HYDROCODONE-ACETAMINOPHEN 10-325 MG PO TABS: 1.0000 | ORAL_TABLET | Freq: Four times a day (QID) | ORAL | 0 refills | Status: DC | PRN

## 2019-05-11 ENCOUNTER — Other Ambulatory Visit: Payer: Self-pay

## 2019-05-11 ENCOUNTER — Ambulatory Visit (HOSPITAL_COMMUNITY)
Admission: RE | Admit: 2019-05-11 | Discharge: 2019-05-11 | Disposition: A | Discharge status: Home / Self Care | Payer: BC Managed Care – PPO | Source: Ambulatory Visit | Attending: Internal Medicine | Admitting: Internal Medicine

## 2019-05-11 DIAGNOSIS — R935 Abnormal findings on diagnostic imaging of other abdominal regions, including retroperitoneum: Secondary | ICD-10-CM | POA: Insufficient documentation

## 2019-05-11 DIAGNOSIS — R109 Unspecified abdominal pain: Secondary | ICD-10-CM | POA: Diagnosis not present

## 2019-05-11 DIAGNOSIS — R140 Abdominal distension (gaseous): Secondary | ICD-10-CM | POA: Diagnosis not present

## 2019-05-11 MED ORDER — IOHEXOL 9 MG/ML PO SOLN
ORAL | Status: AC
  Filled 2019-05-11: qty 1500

## 2019-05-11 MED ORDER — GADOBUTROL 1 MMOL/ML IV SOLN
5.0000 mL | Freq: Once | INTRAVENOUS | Status: AC | PRN
  Administered 2019-05-11: 5 mL via INTRAVENOUS

## 2019-05-14 ENCOUNTER — Telehealth: Payer: Self-pay | Admitting: Internal Medicine

## 2019-05-14 NOTE — Telephone Encounter (Signed)
Patient called in reference to MRI FYI: also requested to speak with Carla Drape

## 2019-05-14 NOTE — Telephone Encounter (Signed)
[place], thanks.  She was better at my last phone call with her I would consider empiric abx for SIBO, rifaxmin if covered, versus other if not [place], if you wish to discuss directly before or after her appt, let me know

## 2019-05-14 NOTE — Telephone Encounter (Signed)
Pt wanted to let Dr. Hilarie Fredrickson know that she is still having the same symptoms. Reports she is still having bad abd pain right over her stomach along with bloating/gas/indigestion. She scheduled an appt with Ellouise Newer PA to be seen, she wanted to be seen sooner that Dr. Vena Rua first available. Dr. Norman Herrlich notified.

## 2019-05-21 ENCOUNTER — Other Ambulatory Visit: Payer: Self-pay

## 2019-05-21 DIAGNOSIS — K2970 Gastritis, unspecified, without bleeding: Secondary | ICD-10-CM

## 2019-05-21 NOTE — Telephone Encounter (Signed)
Let pt know I am not in the office this week, but checking in remotely. Reasonable to do TTG and IGA levels to check for celiac disease

## 2019-05-26 ENCOUNTER — Other Ambulatory Visit: Payer: Self-pay

## 2019-05-26 ENCOUNTER — Other Ambulatory Visit: Payer: BC Managed Care – PPO

## 2019-05-26 ENCOUNTER — Ambulatory Visit (INDEPENDENT_AMBULATORY_CARE_PROVIDER_SITE_OTHER): Payer: BC Managed Care – PPO | Admitting: Physician Assistant

## 2019-05-26 ENCOUNTER — Encounter: Payer: Self-pay | Admitting: Physician Assistant

## 2019-05-26 VITALS — BP 122/70 | HR 64 | Temp 97.9°F | Ht 63.0 in | Wt 120.4 lb

## 2019-05-26 DIAGNOSIS — K2970 Gastritis, unspecified, without bleeding: Secondary | ICD-10-CM

## 2019-05-26 DIAGNOSIS — R1013 Epigastric pain: Secondary | ICD-10-CM | POA: Diagnosis not present

## 2019-05-26 DIAGNOSIS — K6389 Other specified diseases of intestine: Secondary | ICD-10-CM | POA: Diagnosis not present

## 2019-05-26 DIAGNOSIS — R140 Abdominal distension (gaseous): Secondary | ICD-10-CM

## 2019-05-26 MED ORDER — ESOMEPRAZOLE MAGNESIUM 40 MG PO CPDR: 40.0000 mg | DELAYED_RELEASE_CAPSULE | Freq: Two times a day (BID) | ORAL | 3 refills | Status: DC

## 2019-05-26 MED ORDER — AMBULATORY NON FORMULARY MEDICATION: 0 refills | Status: DC

## 2019-05-26 MED ORDER — GI COCKTAIL ~~LOC~~: 30.0000 mL | Freq: Two times a day (BID) | ORAL | 0 refills | Status: DC

## 2019-05-26 MED ORDER — RIFAXIMIN 550 MG PO TABS: 550.0000 mg | ORAL_TABLET | Freq: Three times a day (TID) | ORAL | 0 refills | Status: AC

## 2019-05-26 NOTE — Patient Instructions (Addendum)
If you are age 64 or older, your body mass index should be between 23-30. Your Body mass index is 21.32 kg/m. If this is out of the aforementioned range listed, please consider follow up with your Primary Care Provider.  If you are age 31 or younger, your body mass index should be between 19-25. Your Body mass index is 21.32 kg/m. If this is out of the aformentioned range listed, please consider follow up with your Primary Care Provider.   Your provider has requested that you go to the basement level for lab work before leaving today. Press "B" on the elevator. The lab is located at the first door on the left as you exit the elevator.  1. Stop Omeprazole.  2. Start Nexium 40 mg twice daily.  3. Rifaximin 550 mg three times daily for 14 days. 4. GI cocktail 5-10 ml every 4-6 hours as needed.   Follow up with Dr. Hilarie Fredrickson in 2 months.

## 2019-05-26 NOTE — Progress Notes (Signed)
Chief Complaint: Epigastric pain and bloating  HPI:    Adrienne Robinson is a 64 year old female with a past medical history as listed below, known to Dr. Hilarie Fredrickson, who was referred to me by [name], MD for a complaint of epigastric pain and bloating.      03/11/2019 patient seen in clinic by Carl Best, NP for epigastric pain.  At that time was taken omeprazole 40 mg p.o. twice daily and pantoprazole 40 mg p.o. twice daily for a year.  She had stopped taking omeprazole and was taking pantoprazole 40 p.o. twice daily and added Carafate 1 g p.o. daily as well as Pepto-Bismol.  At that time EGD was scheduled.  As well as CBC, CMP, CRP, amylase, lipase and H. pylori IgG antibody.  Also started on dicyclomine 10 mg 1 p.o. 3 times daily and Florastor.  Continued on pantoprazole 40 p.o. twice daily and Carafate.    03/25/2019 EGD with esophageal polyp, erythematous mucosa in the antrum and normal duodenum.  Biopsies showed chronic inflammation.  She was scheduled for CT scan.    04/18/2019 CT of abdomen pelvis with contrast showed findings favored to represent very low-grade partial obstruction versus small bowel ileus which may be secondary to enteritis.  No complicating features.    05/11/2019 MR enteroabdomen with and without contrast showed no acute findings in the abdomen or pelvis.    Today, the patient tells me that her symptoms have continued.  She is currently taking Omeprazole 40 mg p.o. daily as well as her Dicyclomine 10 mg 3 times daily but continues with a burning pain in her epigastrium which is constant, this is made somewhat worse by eating.  Also describes a burning which radiates up into her chest and causes burning in her chest cavity as well as some "tingling".  Tells me that nothing has made her feel better as of yet and she is a very busy person and tries not to let this get her down, but occasionally when it is so severe she has to just sit down and rest.    Denies fever,  chills, change in bowel habits or blood in her stool.  Past Medical History:  Diagnosis Date  . Allergy   . Arthritis   . 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 B12 deficiency 01/2016  . Candida esophagitis ([place])   . Degenerative joint disease involving multiple joints   . Depression   . Dizziness   . Hypertension   . Pneumonia    approx 6 years ago  . Scoliosis   . Spinal stenosis of lumbar region   . Tubular adenoma of colon     Past Surgical History:  Procedure Laterality Date  . ABDOMINAL HYSTERECTOMY    . APPENDECTOMY    . CARDIAC CATHETERIZATION     normal coronaries, no wall motion abnormalities 11/02/09 [facility])  . NASAL SINUS SURGERY    . OOPHORECTOMY    . RADIOLOGY WITH ANESTHESIA  02/26/2012   Procedure: RADIOLOGY WITH ANESTHESIA;  Surgeon: [name], MD;  Location: [place] [facility];  Service: Radiology;  Laterality: N/A;  . REVERSE SHOULDER ARTHROPLASTY Right 06/27/2013   Procedure: REVERSE RIGHT TOTAL SHOULDER ARTHROPLASTY;  Surgeon: [name], MD;  Location: [place];  Service: Orthopedics;  Laterality: Right;  . SHOULDER ARTHROSCOPY WITH ROTATOR CUFF REPAIR AND SUBACROMIAL DECOMPRESSION  2007   left  . TONSILLECTOMY  adnoids    Current  Outpatient Medications  Medication Sig Dispense Refill  . amLODipine (NORVASC) 10 MG tablet TAKE 1 TABLET BY MOUTH EVERY DAY 90 tablet 1  . buPROPion (WELLBUTRIN XL) 150 MG 24 hr tablet TAKE 3 TABLETS (450 MG TOTAL) BY MOUTH DAILY. (Patient taking differently: Take 150 mg by mouth daily. ) 270 tablet 1  . cyanocobalamin (,VITAMIN B-12,) 1000 MCG/ML injection Inject 1 mL (1,000 mcg total) into the muscle once a week. 10 mL 11  . dicyclomine (BENTYL) 10 MG capsule TAKE 1 CAPSULE EVERY 8 HOURS AS NEEDED FOR ABDOMINAL PAIN 90 capsule 2  . fluticasone (FLONASE) 50 MCG/ACT nasal spray USE 2 SPRAYS IN EACH NOSTRIL DAILY AS NEEDED 16 g 1  . HYDROcodone-acetaminophen (NORCO) 10-325 MG  tablet Take 1 tablet by mouth every 6 (six) hours as needed for severe pain. 120 tablet 0  . LORazepam (ATIVAN) 1 MG tablet Take 1 tablet 1 hour prior to MRI procedure 1 tablet 0  . metoprolol succinate (TOPROL-XL) 25 MG 24 hr tablet TAKE 1 TABLET BY MOUTH EVERY DAY 90 tablet 1  . omeprazole (PRILOSEC) 40 MG capsule Take 1 capsule (40 mg total) by mouth daily. 30 capsule 2  . Syringe/Needle, Disp, (SYRINGE 3CC/25GX1") 25G X 1" 3 ML MISC Use for b12 injections 50 each 0  . zolpidem (AMBIEN) 10 MG tablet TAKE 1 TABLET (10 MG TOTAL) BY MOUTH AT BEDTIME AS NEEDED. FOR SLEEP 30 tablet 2  . AMBULATORY NON FORMULARY MEDICATION 90 ml 2% Lidocaine, 90 ml Dicyclomine 10 mg/36ml, 270 ml Maalox - Take 5-10 ml every 4-6 hours as needed. 240 mL 0  . esomeprazole (NEXIUM) 40 MG capsule Take 1 capsule (40 mg total) by mouth 2 (two) times daily before a meal. 60 capsule 3  . naloxone (NARCAN) 0.4 MG/ML injection Inject 1 mL (0.4 mg total) into the vein as needed. (Patient not taking: Reported on 05/26/2019) 1 mL 3  . rifaximin (XIFAXAN) 550 MG TABS tablet Take 1 tablet (550 mg total) by mouth 3 (three) times daily for 14 days. 42 tablet 0   Current Facility-Administered Medications  Medication Dose Route Frequency Provider Last Rate Last Admin  . 0.9 %  sodium chloride infusion  500 mL Intravenous Continuous Pyrtle, Lajuan Lines, MD        Allergies as of 05/26/2019 - Review Complete 05/26/2019  Allergen Reaction Noted  . Morphine and related Rash 04/16/2012  . Adhesive [tape] Rash and Other (See Comments) 11/03/2010    Family History  Problem Relation Age of Onset  . Colon cancer Father 58  . Diabetes Father   . Heart disease Father   . Rectal cancer Father   . Thyroid disease Mother   . Colon cancer Paternal Grandfather   . Colon cancer Other        pggm  . Breast cancer Paternal Grandmother   . Stomach cancer Neg Hx     Social History   Socioeconomic History  . Marital status: Married    Spouse name:  Not on file  . Number of children: 0  . Years of education: 59  . Highest education level: Not on file  Occupational History  . Occupation: Nurse executive   Tobacco Use  . Smoking status: Never Smoker  . Smokeless tobacco: Never Used  Substance and Sexual Activity  . Alcohol use: Yes    Alcohol/week: 2.0 standard drinks    Types: 2 Glasses of wine per week    Comment: 2-3 week with dinner  .  Drug use: No  . Sexual activity: Not on file  Other Topics Concern  . Not on file  Social History Narrative   Lives at home with her husband.   Right-handed.   2-3 diet cokes per day.   Social Determinants of Health   Financial Resource Strain:   . Difficulty of Paying Living Expenses:   Food Insecurity:   . Worried About Charity fundraiser in the Last Year:   . Arboriculturist in the Last Year:   Transportation Needs:   . Film/video editor (Medical):   Marland Kitchen Lack of Transportation (Non-Medical):   Physical Activity:   . Days of Exercise per Week:   . Minutes of Exercise per Session:   Stress:   . Feeling of Stress :   Social Connections:   . Frequency of Communication with Friends and Family:   . Frequency of Social Gatherings with Friends and Family:   . Attends Religious Services:   . Active Member of Clubs or Organizations:   . Attends Archivist Meetings:   Marland Kitchen Marital Status:   Intimate Partner Violence:   . Fear of Current or Ex-Partner:   . Emotionally Abused:   Marland Kitchen Physically Abused:   . Sexually Abused:     Review of Systems:    Constitutional: No weight loss, fever or chills Cardiovascular: No chest pain Respiratory: No SOB  Gastrointestinal: See HPI and otherwise negative   Physical Exam:  Vital signs: BP 122/70 (BP Location: Left Arm, Patient Position: Sitting, Cuff Size: Normal)   Pulse 64   Temp 97.9 F (36.6 C)   Ht 5\' 3"  (1.6 m) Comment: height measured without shoes  Wt 120 lb 6 oz (54.6 kg)   BMI 21.32 kg/m   Constitutional:    Pleasant Caucasian female appears to be in NAD, Well developed, Well nourished, alert and cooperative Respiratory: Respirations even and unlabored. Lungs clear to auscultation bilaterally.   No wheezes, crackles, or rhonchi.  Cardiovascular: Normal S1, S2. No MRG. Regular rate and rhythm. No peripheral edema, cyanosis or pallor.  Gastrointestinal: Mild epigastric ttp. No rebound or guarding. Normal bowel sounds. No appreciable masses or hepatomegaly. Rectal:  Not performed.  Psychiatric: Demonstrates good judgement and reason without abnormal affect or behaviors.  RELEVANT LABS AND IMAGING: CBC    Component Value Date/Time   WBC 4.9 03/25/2019 1410   RBC 3.63 (L) 03/25/2019 1410   HGB 11.9 (L) 03/25/2019 1410   HGB 12.1 02/07/2012 1518   HCT 34.3 (L) 03/25/2019 1410   HCT 35.2 02/07/2012 1518   PLT 352.0 03/25/2019 1410   PLT 279 02/07/2012 1518   MCV 94.5 03/25/2019 1410   MCV 93 02/07/2012 1518   MCH 33.1 (H) 01/25/2018 1646   MCHC 34.8 03/25/2019 1410   RDW 12.3 03/25/2019 1410   RDW 12.6 12/30/2012 0836   RDW 12.5 02/07/2012 1518   LYMPHSABS 1.5 03/25/2019 1410   LYMPHSABS 1.0 12/30/2012 0836   LYMPHSABS 1.1 02/07/2012 1518   MONOABS 0.6 03/25/2019 1410   MONOABS 0.3 02/07/2012 1518   EOSABS 0.1 03/25/2019 1410   EOSABS 0.1 12/30/2012 0836   EOSABS 0.0 02/07/2012 1518   BASOSABS 0.1 03/25/2019 1410   BASOSABS 0.0 12/30/2012 0836   BASOSABS 0.0 02/07/2012 1518    CMP     Component Value Date/Time   NA 135 03/11/2019 1133   NA 137 09/14/2011 1624   K 3.2 (L) 03/11/2019 1133   K 4.1 09/14/2011 1624  CL 100 03/11/2019 1133   CL 101 09/14/2011 1624   CO2 24 03/11/2019 1133   CO2 25 09/14/2011 1624   GLUCOSE 89 03/11/2019 1133   GLUCOSE 105 (H) 09/14/2011 1624   BUN 16 04/25/2019 1615   BUN 14 09/14/2011 1624   CREATININE 1.05 04/25/2019 1615   CREATININE 0.99 05/09/2016 0934   CALCIUM 9.5 03/11/2019 1133   CALCIUM 9.3 09/14/2011 1624   PROT 6.9 03/11/2019  1133   PROT 7.2 12/30/2012 0836   PROT 7.7 09/14/2011 1624   ALBUMIN 4.5 03/11/2019 1133   ALBUMIN 5.2 12/30/2012 0836   ALBUMIN 4.6 09/14/2011 1624   AST 14 03/11/2019 1133   AST 24 09/14/2011 1624   ALT 11 03/11/2019 1133   ALT 25 09/14/2011 1624   ALKPHOS 47 03/11/2019 1133   ALKPHOS 75 09/14/2011 1624   BILITOT 0.3 03/11/2019 1133   BILITOT 0.3 09/14/2011 1624   GFRNONAA 62 05/09/2016 0934   GFRAA 72 05/09/2016 0934    Assessment: 1.  Epigastric pain: Continues, recent EGD with chronic inflammation, CT with possible bowel obstruction, MR enterography with no sign of Crohn's or obstruction, labs normal; consider gastritis plus/minus functional dyspepsia versus SIBO versus other 2.  Bloating: Constant per the patient related to above, consider SIBO  Plan: 1.  We will treat with Rifaximin for suspected SIBO as per Dr. Vena Rua recommendations.  Prescribed Xifaxan 550 mg 3 times daily x14 days 2.  Patient will stop Omeprazole and start Nexium 40 mg p.o. twice daily #60 with 3 refills 3.  Patient tells me that she is going to get a Prednisone taper from her PCP for some rhinitis.  Would recommend that she not take the Prednisone at the same time as the Rifaximin as this could confuse whether or not this medicine is helping or not. 4.  Also prescribed GI cocktail which patient can use 5-10 mL every 4-6 hours as needed 5.  Patient to follow in clinic with Dr. Hilarie Fredrickson in 2-3 months.  She will contact us before then if she continues with problems.  Ellouise Newer, PA-C [facility] 05/26/2019, 9:36 AM  Cc: [name], MD

## 2019-05-27 LAB — TISSUE TRANSGLUTAMINASE ABS,IGG,IGA
(tTG) Ab, IgA: 1 U/mL
(tTG) Ab, IgG: 3 U/mL

## 2019-05-28 NOTE — Progress Notes (Signed)
Addendum: Reviewed and agree with assessment and management plan. Haydon Kalmar M, MD  

## 2019-06-02 ENCOUNTER — Telehealth (INDEPENDENT_AMBULATORY_CARE_PROVIDER_SITE_OTHER): Payer: BC Managed Care – PPO | Admitting: Internal Medicine

## 2019-06-02 DIAGNOSIS — R630 Anorexia: Secondary | ICD-10-CM

## 2019-06-02 DIAGNOSIS — R1013 Epigastric pain: Secondary | ICD-10-CM

## 2019-06-02 DIAGNOSIS — R140 Abdominal distension (gaseous): Secondary | ICD-10-CM

## 2019-06-02 DIAGNOSIS — K30 Functional dyspepsia: Secondary | ICD-10-CM

## 2019-06-02 NOTE — Telephone Encounter (Signed)
Patient states that she was just examined by an extender and was seen last week. She really just wanted to talk to you via phone. However, if you think she needs to be examined again, she will come. Please advise.Marland KitchenMarland Kitchen

## 2019-06-02 NOTE — Telephone Encounter (Signed)
I have left a message for patient to call back. 

## 2019-06-02 NOTE — Telephone Encounter (Signed)
Adrienne Robinson, Please let patient know I saw this message See if she can come to see me today at 350 PM I do not see other opening this week or next and if she cannot come today we can look for a time in the future Thanks JMP

## 2019-06-02 NOTE — Progress Notes (Signed)
Subjective:    Patient ID: Adrienne Robinson, female    DOB: September 08, 1955, 64 y.o.   MRN: [id number]  This service was provided via telemedicine.  My chart application used with A/V communication The patient was located at work The provider was located in provider's GI office. The patient did consent to this telephone visit and is aware of possible charges through their insurance for this visit.   The persons participating in this telemedicine service were the patient and I. Time spent on call: 15 minutes   HPI Adrienne Robinson is a 64 year old female with a history of GERD, adenomatous colon polyp, hypertension, history of brain AVM treated with embolization in 2014, history of chronic back and neck pain on hydrocodone and history of depression who seen for follow-up.  She is seen virtually today.  She was seen recently by Ellouise Newer, PA-C and had an upper endoscopy which I performed on 03/25/2019.  She has continued to have epigastric abdominal pain, bloating.  EGD on 03/25/2019 showed a 4 mm polyp in the esophagus which was removed and found to be a benign papilloma.  There was moderately erythematous mucosa in the antrum which was biopsied and the duodenum was normal.  These biopsies showed slight chronic inflammation without H. pylori, metaplasia, dysplasia or malignancy.  She has had a evaluation recently by a CT scan of the abdomen and pelvis which showed possible enteritis versus low-grade partial obstruction versus ileus.  No complicating features were identified.  We followed this up due to persistent symptoms by MR enterography which was performed on 05/11/2019.  This was normal and showed no evidence of acute findings in the abdomen or pelvis.  No findings indicating inflammatory bowel disease.  The liver was normal as was the gallbladder.  Pancreas was normal.  She saw Ellouise Newer recently as discussed and was started on rifaximin 550 mg 3 times daily of which she has taken 7 to 14  days and her PPI was changed to Nexium 40 mg twice daily AC.  She reports that she continues to have despite 7 days of rifaximin therapy and twice daily Nexium consistent epigastric pain just below her xiphoid.  There is soreness and she feels like there is inflammation inside.  She is having some indigestion.  She continues to have significant bloating.  Her appetite has been down and she basically forces herself to eat though it can make her symptoms worse.  Her bowel movements have been normal without blood or melena.  She has a bowel movement about every other day.  She will occasionally use a suppository to try to relieve some of her abdominal pressure and bloating.  She is using hydrocodone-acetaminophen 10 mg - 325 mg several times per day for her chronic pain.   Review of Systems As per HPI, otherwise negative  Current Medications, Allergies, Past Medical History, Past Surgical History, Family History and Social History were reviewed in [employer] record.     Objective:   Physical Exam There were no vitals taken for this visit. Virtual visit, patient appears well in no acute distress, sitting behind her desk at work  [place] (MR ENTEROGRAPHY)   TECHNIQUE: Multiplanar, multisequence MRI of the abdomen and pelvis was performed both before and during bolus administration of intravenous contrast. Negative oral contrast VoLumen was given.   CONTRAST:  77mL GADAVIST GADOBUTROL 1 MMOL/ML IV SOLN   COMPARISON:  No prior abdominal MRI. CT the  abdomen and pelvis 04/18/2019.   FINDINGS: COMBINED FINDINGS FOR BOTH MR ABDOMEN AND PELVIS   Lower chest: Unremarkable.   Hepatobiliary: No suspicious cystic or solid hepatic lesions. No intra or extrahepatic biliary ductal dilatation. Gallbladder is normal in appearance.   Pancreas: No pancreatic mass. No pancreatic ductal dilatation. No pancreatic or peripancreatic fluid  collections or inflammatory changes.   Spleen:  Unremarkable.   Adrenals/Urinary Tract: Bilateral kidneys and bilateral adrenal glands are normal in appearance. No hydroureteronephrosis. Urinary bladder is normal in appearance.   Stomach/Bowel: Normal appearance of the stomach. No pathologic dilatation of small bowel or colon. No definite focal areas of mural thickening associated with the stomach, small bowel or colon.   Vascular/Lymphatic: No aneurysm or dissection noted in the abdominal or pelvic vasculature. No lymphadenopathy noted in the abdomen or pelvis.   Reproductive: Status post hysterectomy. Ovaries are not confidently identified may be surgically absent or atrophic.   Other: No significant volume of ascites noted in the visualized portions of the peritoneal cavity.   Musculoskeletal: No aggressive appearing osseous lesions are noted in the visualized portions of the skeleton.   IMPRESSION: 1. No acute findings are noted in the abdomen or pelvis. No imaging findings to clearly indicate inflammatory bowel disease.     Electronically Signed   By: Vinnie Langton M.D.   On: 05/11/2019 17:36   CT ABDOMEN AND PELVIS WITH CONTRAST   TECHNIQUE: Multidetector CT imaging of the abdomen and pelvis was performed using the standard protocol following bolus administration of intravenous contrast.   CONTRAST:  14mL OMNIPAQUE IOHEXOL 300 MG/ML  SOLN   COMPARISON:  11/07/2012.   FINDINGS: Lower chest: No acute abnormality.   Hepatobiliary: No focal liver abnormality identified. The gallbladder is normal. No biliary ductal dilatation.   Pancreas: Unremarkable. No pancreatic ductal dilatation or surrounding inflammatory changes.   Spleen: Normal in size without focal abnormality.   Adrenals/Urinary Tract: The adrenal glands appear normal. The kidneys are unremarkable. No mass or hydronephrosis identified. Urinary bladder unremarkable.   Stomach/Bowel: Stomach  appears normal. Proximal jejunal loops of bowel are unremarkable. The mid small bowel loops within the lower abdomen and pelvis are mildly increased in diameter with air-fluid levels. These measure up to 2.5 cm, image 55/2. Fecalization of enteric contrast material is identified just proximal to a transition to decreased caliber terminal ileum, image 62/2. No bowel wall inflammation, wall thickening, fat stranding or free fluid. No discrete mass identified. Normal colon.   Vascular/Lymphatic: Aortic atherosclerosis. No abdominopelvic adenopathy.   Reproductive: Status post hysterectomy. No adnexal masses.   Other: No abdominal wall hernia or abnormality. No abdominopelvic ascites.   Musculoskeletal: Mild lumbar scoliosis with degenerative disc disease.   IMPRESSION: 1. Findings are favored to represent very low grade partial obstruction versus small bowel ileus which may be secondary to enteritis. No complicating features identified.     Electronically Signed   By: Kerby Moors M.D.   On: 04/18/2019 12:11  Hemoccult cards January 2021 --negative     Assessment & Plan:  64 year old female with a history of GERD, adenomatous colon polyp, hypertension, history of brain AVM treated with embolization in 2014, history of chronic back and neck pain on hydrocodone and history of depression who seen for follow-up.   1.  Ongoing epigastric pain/bloating/indigestion --we discussed her symptoms at length today.  I am hopeful that rifaximin after she completes the entire course will lead to symptom improvement.  I recommended that she continue rifaximin 550 mg  3 times daily the remainder of the 14-day course.  Continue Nexium 40 mg twice daily AC.  We discussed other possible etiologies of her symptoms and we also discussed the prior imaging which has been reassuring.  Her MR enterography was normal.  Her celiac panel was negative and small bowel biopsies were also normal in 2018.  She had a  more remote HIDA scan in 2014 which was normal.  This was with [facility].  Differential includes gastric dysmotility/gastroparesis with her primary risk factor being chronic narcotics and also biliary dyskinesia. --Complete rifaximin 550 mg 3 times daily, she has 7 additional days, about a week after completing therapy I asked her to let me know if symptoms fail to improve --If she remains symptomatic I would recommend a 4-hour gastric emptying study --If gastric emptying study normal then I would recommend [facility]CK HIDA --If CCK HIDA negative then consider amitriptyline for IBS and chronic epigastric discomfort  30 minutes total spent today including patient facing time, coordination of care, reviewing medical history/procedures/pertinent radiology studies, and documentation of the encounter.

## 2019-06-02 NOTE — Telephone Encounter (Signed)
Spoke to patient, she has scheduled virtual visit for today at 4 pm.

## 2019-06-02 NOTE — Telephone Encounter (Signed)
Can set up a virtual mychart visit at 340 or 4 pm Thanks JMP

## 2019-06-09 ENCOUNTER — Telehealth (INDEPENDENT_AMBULATORY_CARE_PROVIDER_SITE_OTHER): Payer: BC Managed Care – PPO | Admitting: Internal Medicine

## 2019-06-09 ENCOUNTER — Encounter: Payer: Self-pay | Admitting: Internal Medicine

## 2019-06-09 DIAGNOSIS — R140 Abdominal distension (gaseous): Secondary | ICD-10-CM | POA: Diagnosis not present

## 2019-06-09 DIAGNOSIS — M25461 Effusion, right knee: Secondary | ICD-10-CM

## 2019-06-09 DIAGNOSIS — G894 Chronic pain syndrome: Secondary | ICD-10-CM

## 2019-06-09 HISTORY — DX: Effusion, right knee: M25.461

## 2019-06-09 MED ORDER — HYDROCODONE-ACETAMINOPHEN 10-325 MG PO TABS: 1.0000 | ORAL_TABLET | Freq: Four times a day (QID) | ORAL | 0 refills | Status: DC | PRN

## 2019-06-09 NOTE — Assessment & Plan Note (Signed)
Nontraumatic,  Occurred in the setting of recent initiation of compression knee highs.  Has appt with sports medicine on Friday. Advised her to have it drained

## 2019-06-09 NOTE — Assessment & Plan Note (Signed)
Workup by GI with MRI negative for malignancy .  Gastroparesis suspected  But still taking rifaxamin course with no improvement thus far.

## 2019-06-09 NOTE — Assessment & Plan Note (Signed)
She has chronic back pain Complicated by scoliosis. She has persistent shoulder pain despite prior reconstruction.  celebrex has been stopped due to CKD and prednisone taper used in the recent past to  manage recent increase in neck pain.   .She has not had any ER visits. Her Refill history was confirmed via [place] Controlled Substance database by me today during her visit and there have been no prescriptions of controlled substances filled from any providers other than me. .Will refill for April may and June

## 2019-06-09 NOTE — Progress Notes (Signed)
Virtual Visit via caregility   This visit type was conducted due to national recommendations for restrictions regarding the COVID-19 pandemic (e.g. social distancing).  This format is felt to be most appropriate for this patient at this time.  All issues noted in this document were discussed and addressed.  No physical exam was performed (except for noted visual exam findings with Video Visits).   I connected with@ on 06/09/19 at  1:30 PM EDT by a video enabled telemedicine application or telephone and verified that I am speaking with the correct person using two identifiers. Location patient: home Location provider: work or home office Persons participating in the virtual visit: patient, provider  I discussed the limitations, risks, security and privacy concerns of performing an evaluation and management service by telephone and the availability of in person appointments. I also discussed with the patient that there may be a patient responsible charge related to this service. The patient expressed understanding and agreed to proceed.  Reason for visit: medication refill  HPI:  64 yr old female with chronic pain managed with hydrocodone due to CKD and history of gastritis. She has not had any ER visits  And has not requested any early refills.  Her Refill history was confirmed via [place] Controlled Substance database by me today during her visit and there have been no prescriptions of controlled substances filled from any providers other than me. .   Abdominal distension/bloating  and pain .  Currently being treated with rifaximin, and nexium bid.  MRI negative.  .  No nausea   Has had increased left shoulder pain,  Has had two injections by radiologist Dr.  Maree Erie through Dr Charlann Boxer  New lump on right patella , came up after wearing compression  Stockings   ROS: See pertinent positives and negatives per HPI.  Past Medical History:  Diagnosis Date  . Allergy   . Arthritis   . 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 B12 deficiency 01/2016  . Candida esophagitis ([place])   . Degenerative joint disease involving multiple joints   . Depression   . Dizziness   . Hypertension   . Pneumonia    approx 6 years ago  . Scoliosis   . Spinal stenosis of lumbar region   . Tubular adenoma of colon     Past Surgical History:  Procedure Laterality Date  . ABDOMINAL HYSTERECTOMY    . APPENDECTOMY    . CARDIAC CATHETERIZATION     normal coronaries, no wall motion abnormalities 11/02/09 [facility])  . NASAL SINUS SURGERY    . OOPHORECTOMY    . RADIOLOGY WITH ANESTHESIA  02/26/2012   Procedure: RADIOLOGY WITH ANESTHESIA;  Surgeon: [name], MD;  Location: [place];  Service: Radiology;  Laterality: N/A;  . REVERSE SHOULDER ARTHROPLASTY Right 06/27/2013   Procedure: REVERSE RIGHT TOTAL SHOULDER ARTHROPLASTY;  Surgeon: [name], MD;  Location: [place];  Service: Orthopedics;  Laterality: Right;  . SHOULDER ARTHROSCOPY WITH ROTATOR CUFF REPAIR AND SUBACROMIAL DECOMPRESSION  2007   left  . TONSILLECTOMY  adnoids    Family History  Problem Relation Age of Onset  . Colon cancer Father 51  . Diabetes Father   . Heart disease Father   . Rectal cancer Father   . Thyroid disease Mother   . Colon cancer Paternal Grandfather   . Colon cancer Other        pggm  . Breast  cancer Paternal Grandmother   . Stomach cancer Neg Hx     SOCIAL HX:  reports that she has never smoked. She has never used smokeless tobacco. She reports current alcohol use of about 2.0 standard drinks of alcohol per week. She reports that she does not use drugs.   Current Outpatient Medications:  .  AMBULATORY NON FORMULARY MEDICATION, 90 ml 2% Lidocaine, 90 ml Dicyclomine 10 mg/61ml, 270 ml Maalox - Take 5-10 ml every 4-6 hours as needed., Disp: 240 mL, Rfl: 0 .  amLODipine (NORVASC) 10 MG tablet, TAKE 1 TABLET BY MOUTH EVERY DAY, Disp: 90 tablet, Rfl:  1 .  buPROPion (WELLBUTRIN XL) 150 MG 24 hr tablet, TAKE 3 TABLETS (450 MG TOTAL) BY MOUTH DAILY. (Patient taking differently: Take 150 mg by mouth daily. ), Disp: 270 tablet, Rfl: 1 .  cyanocobalamin (,VITAMIN B-12,) 1000 MCG/ML injection, Inject 1 mL (1,000 mcg total) into the muscle once a week., Disp: 10 mL, Rfl: 11 .  dicyclomine (BENTYL) 10 MG capsule, TAKE 1 CAPSULE EVERY 8 HOURS AS NEEDED FOR ABDOMINAL PAIN, Disp: 90 capsule, Rfl: 2 .  esomeprazole (NEXIUM) 40 MG capsule, Take 1 capsule (40 mg total) by mouth 2 (two) times daily before a meal., Disp: 60 capsule, Rfl: 3 .  fluticasone (FLONASE) 50 MCG/ACT nasal spray, USE 2 SPRAYS IN EACH NOSTRIL DAILY AS NEEDED, Disp: 16 g, Rfl: 1 .  [START ON 06/12/2019] HYDROcodone-acetaminophen (NORCO) 10-325 MG tablet, Take 1 tablet by mouth every 6 (six) hours as needed for severe pain., Disp: 120 tablet, Rfl: 0 .  [START ON 07/12/2019] HYDROcodone-acetaminophen (NORCO) 10-325 MG tablet, Take 1 tablet by mouth every 6 (six) hours as needed for severe pain., Disp: 120 tablet, Rfl: 0 .  metoprolol succinate (TOPROL-XL) 25 MG 24 hr tablet, TAKE 1 TABLET BY MOUTH EVERY DAY, Disp: 90 tablet, Rfl: 1 .  rifaximin (XIFAXAN) 550 MG TABS tablet, Take 1 tablet (550 mg total) by mouth 3 (three) times daily for 14 days., Disp: 42 tablet, Rfl: 0 .  Syringe/Needle, Disp, (SYRINGE 3CC/25GX1") 25G X 1" 3 ML MISC, Use for b12 injections, Disp: 50 each, Rfl: 0 .  zolpidem (AMBIEN) 10 MG tablet, TAKE 1 TABLET (10 MG TOTAL) BY MOUTH AT BEDTIME AS NEEDED. FOR SLEEP, Disp: 30 tablet, Rfl: 2 .  [START ON 08/11/2019] HYDROcodone-acetaminophen (NORCO) 10-325 MG tablet, Take 1 tablet by mouth every 6 (six) hours as needed for severe pain., Disp: 120 tablet, Rfl: 0 .  naloxone (NARCAN) 0.4 MG/ML injection, Inject 1 mL (0.4 mg total) into the vein as needed. (Patient not taking: Reported on 05/26/2019), Disp: 1 mL, Rfl: 3  Current Facility-Administered Medications:  .  0.9 %  sodium  chloride infusion, 500 mL, Intravenous, Continuous, Pyrtle, Lajuan Lines, MD  EXAM:  VITALS per patient if applicable:  GENERAL: alert, oriented, appears well and in no acute distress  HEENT: atraumatic, conjunttiva clear, no obvious abnormalities on inspection of external nose and ears  NECK: normal movements of the head and neck  LUNGS: on inspection no signs of respiratory distress, breathing rate appears normal, no obvious gross SOB, gasping or wheezing  CV: no obvious cyanosis  MS: moves all visible extremities without noticeable abnormality.  Right knee with large effusion , prepatellar   PSYCH/NEURO: pleasant and cooperative, no obvious depression or anxiety, speech and thought processing grossly intact  ASSESSMENT AND PLAN:  Discussed the following assessment and plan:  Prepatellar effusion of right knee  Chronic pain syndrome  Abdominal  bloating  Prepatellar effusion of right knee Nontraumatic,  Occurred in the setting of recent initiation of compression knee highs.  Has appt with sports medicine on Friday. Advised her to have it drained  Chronic pain syndrome She has chronic back pain Complicated by scoliosis. She has persistent shoulder pain despite prior reconstruction.  celebrex has been stopped due to CKD and prednisone taper used in the recent past to  manage recent increase in neck pain.   .She has not had any ER visits. Her Refill history was confirmed via [place] Controlled Substance database by me today during her visit and there have been no prescriptions of controlled substances filled from any providers other than me. .Will refill for April may and June   Abdominal bloating Workup by GI with MRI negative for malignancy .  Gastroparesis suspected  But still taking rifaxamin course with no improvement thus far.     I discussed the assessment and treatment plan with the patient. The patient was provided an opportunity to ask questions and all were answered. The patient  agreed with the plan and demonstrated an understanding of the instructions.   The patient was advised to call back or seek an in-person evaluation if the symptoms worsen or if the condition fails to improve as anticipated.  I provided  30 minutes of non-face-to-face time during this encounter reviewing patient's current problems and past surgeries, labs and imaging studies, providing counseling on the above mentioned problems , and coordination  of care .  [name], MD

## 2019-06-12 NOTE — Progress Notes (Signed)
[place] [facility] [place] Phone: [phone] Subjective:   Adrienne Robinson, am serving as a scribe for Dr. Hulan Saas. This visit occurred during the SARS-CoV-2 public health emergency.  Safety protocols were in place, including screening questions prior to the visit, additional usage of staff PPE, and extensive cleaning of exam room while observing appropriate contact time as indicated for disinfecting solutions.   I'm seeing this patient by the request  of:  [name], MD  CC: Neck pain, back pain, thumb pain, knee pain  [id number]   11/25/2018 Worsening symptoms with radicular symptoms.  Reviewed reviewed patient's previous imaging.  Significant progression noted.  I do think the patient needs advanced imaging including an MRI and would be a candidate for possible epidurals if needed.  Patient is in agreement with the plan.  Patient does have claustrophobia given Valium.  Doing an open MRI.  Follow-up after imaging spent  25 minutes with patient face-to-face and had greater than 50% of counseling including as described above in assessment and plan.  Update 06/13/2019 Adrienne Robinson is a 64 y.o. female coming in with complaint of neck pain. Had epidural on January. Pain decreased but is now a constant 10/10. Left side of neck and right side of lumbar spine. Is using Blue Emu and hydrocodone.   Would like injection in left CMC. Last injection 11/11/2018.   Has swelling over right tibial tuberosity since wearing her compression socks. Is not painful yet but is concerned that it will get painful.     Past Medical History:  Diagnosis Date  . Allergy   . Arthritis   . 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 B12 deficiency 01/2016  . Candida esophagitis ([place])   . Degenerative joint disease involving multiple joints   . Depression   . Dizziness   . Hypertension   .  Pneumonia    approx 6 years ago  . Scoliosis   . Spinal stenosis of lumbar region   . Tubular adenoma of colon    Past Surgical History:  Procedure Laterality Date  . ABDOMINAL HYSTERECTOMY    . APPENDECTOMY    . CARDIAC CATHETERIZATION     normal coronaries, Robinson wall motion abnormalities 11/02/09 [facility])  . NASAL SINUS SURGERY    . OOPHORECTOMY    . RADIOLOGY WITH ANESTHESIA  02/26/2012   Procedure: RADIOLOGY WITH ANESTHESIA;  Surgeon: [name], MD;  Location: [place];  Service: Radiology;  Laterality: N/A;  . REVERSE SHOULDER ARTHROPLASTY Right 06/27/2013   Procedure: REVERSE RIGHT TOTAL SHOULDER ARTHROPLASTY;  Surgeon: [name], MD;  Location: [place];  Service: Orthopedics;  Laterality: Right;  . SHOULDER ARTHROSCOPY WITH ROTATOR CUFF REPAIR AND SUBACROMIAL DECOMPRESSION  2007   left  . TONSILLECTOMY  adnoids   Social History   Socioeconomic History  . Marital status: Married    Spouse name: Not on file  . Number of children: 0  . Years of education: 69  . Highest education level: Not on file  Occupational History  . Occupation: Nurse executive   Tobacco Use  . Smoking status: Never Smoker  . Smokeless tobacco: Never Used  Substance and Sexual Activity  . Alcohol use: Yes    Alcohol/week: 2.0 standard drinks    Types: 2 Glasses of wine per week    Comment: 2-3 week with dinner  . Drug use: Robinson  .  Sexual activity: Not on file  Other Topics Concern  . Not on file  Social History Narrative   Lives at home with her husband.   Right-handed.   2-3 diet cokes per day.   Social Determinants of Health   Financial Resource Strain:   . Difficulty of Paying Living Expenses:   Food Insecurity:   . Worried About Charity fundraiser in the Last Year:   . Arboriculturist in the Last Year:   Transportation Needs:   . Film/video editor (Medical):   Marland Kitchen Lack of Transportation (Non-Medical):   Physical Activity:   . Days of Exercise per Week:   . Minutes of  Exercise per Session:   Stress:   . Feeling of Stress :   Social Connections:   . Frequency of Communication with Friends and Family:   . Frequency of Social Gatherings with Friends and Family:   . Attends Religious Services:   . Active Member of Clubs or Organizations:   . Attends Archivist Meetings:   Marland Kitchen Marital Status:    Allergies  Allergen Reactions  . Morphine And Related Rash  . Adhesive [Tape] Rash and Other (See Comments)    Redness, blisters, skin peeling off.   Family History  Problem Relation Age of Onset  . Colon cancer Father 45  . Diabetes Father   . Heart disease Father   . Rectal cancer Father   . Thyroid disease Mother   . Colon cancer Paternal Grandfather   . Colon cancer Other        pggm  . Breast cancer Paternal Grandmother   . Stomach cancer Neg Hx       Current Outpatient Medications (Cardiovascular):  .  amLODipine (NORVASC) 10 MG tablet, TAKE 1 TABLET BY MOUTH EVERY DAY .  metoprolol succinate (TOPROL-XL) 25 MG 24 hr tablet, TAKE 1 TABLET BY MOUTH EVERY DAY   Current Outpatient Medications (Respiratory):  .  fluticasone (FLONASE) 50 MCG/ACT nasal spray, USE 2 SPRAYS IN EACH NOSTRIL DAILY AS NEEDED   Current Outpatient Medications (Analgesics):  .  HYDROcodone-acetaminophen (NORCO) 10-325 MG tablet, Take 1 tablet by mouth every 6 (six) hours as needed for severe pain. Derrill Memo ON 07/12/2019] HYDROcodone-acetaminophen (NORCO) 10-325 MG tablet, Take 1 tablet by mouth every 6 (six) hours as needed for severe pain. Derrill Memo ON 08/11/2019] HYDROcodone-acetaminophen (NORCO) 10-325 MG tablet, Take 1 tablet by mouth every 6 (six) hours as needed for severe pain.   Current Outpatient Medications (Hematological):  .  cyanocobalamin (,VITAMIN B-12,) 1000 MCG/ML injection, Inject 1 mL (1,000 mcg total) into the muscle once a week.   Current Outpatient Medications (Other):  Marland Kitchen  AMBULATORY NON FORMULARY MEDICATION, 90 ml 2% Lidocaine, 90 ml  Dicyclomine 10 mg/75ml, 270 ml Maalox - Take 5-10 ml every 4-6 hours as needed. Marland Kitchen  buPROPion (WELLBUTRIN XL) 150 MG 24 hr tablet, TAKE 3 TABLETS (450 MG TOTAL) BY MOUTH DAILY. (Patient taking differently: Take 150 mg by mouth daily. ) .  dicyclomine (BENTYL) 10 MG capsule, TAKE 1 CAPSULE EVERY 8 HOURS AS NEEDED FOR ABDOMINAL PAIN .  esomeprazole (NEXIUM) 40 MG capsule, Take 1 capsule (40 mg total) by mouth 2 (two) times daily before a meal. .  naloxone (NARCAN) 0.4 MG/ML injection, Inject 1 mL (0.4 mg total) into the vein as needed. .  Syringe/Needle, Disp, (SYRINGE 3CC/25GX1") 25G X 1" 3 ML MISC, Use for b12 injections .  zolpidem (AMBIEN) 10 MG  tablet, TAKE 1 TABLET (10 MG TOTAL) BY MOUTH AT BEDTIME AS NEEDED. FOR SLEEP  Current Facility-Administered Medications (Other):  .  0.9 %  sodium chloride infusion   Reviewed prior external information including notes and imaging from  primary care provider As well as notes that were available from care everywhere and other healthcare systems.  Past medical history, social, surgical and family history all reviewed in electronic medical record.  Robinson pertanent information unless stated regarding to the chief complaint.   Review of Systems:  Robinson headache, visual changes, nausea, vomiting, diarrhea, constipation, dizziness, abdominal pain, skin rash, fevers, chills, night sweats, weight loss, swollen lymph nodes, body aches, joint swelling, chest pain, shortness of breath, mood changes. POSITIVE muscle aches  Objective  Blood pressure 108/74, height 5\' 3"  (1.6 m), weight 123 lb (55.8 kg).   General: Robinson apparent distress alert and oriented x3 mood and affect normal, dressed appropriately.  HEENT: Pupils equal, extraocular movements intact  Respiratory: Patient's speak in full sentences and does not appear short of breath  Cardiovascular: Robinson lower extremity edema, non tender, Robinson erythema  Neuro: Cranial nerves II through XII are intact, neurovascularly  intact in all extremities with 2+ DTRs and 2+ pulses.  Gait normal with good balance and coordination.  MSK: Moderate to severe arthritic changes of multiple joints  Left CMC joint positive grind.  Significant arthritic changes noted.  Swelling and synovitis of the joint noted.  Mild thenar eminence wasting  Right knee has a prepatellar bursa that is enlarged.  Nontender.  Full range of motion of the knee with an intact extensor mechanism.  Low back exam does have scoliosis.  Patient does have loss of lordosis as well.  Tender to palpation of paraspinal musculature lumbar spine right greater than left.  Procedure: Real-time Ultrasound Guided Injection of left CMC joint Device: GE Logiq Q7 Ultrasound guided injection is preferred based studies that show increased duration, increased effect, greater accuracy, decreased procedural pain, increased response rate, and decreased cost with ultrasound guided versus blind injection.  Verbal informed consent obtained.  Time-out conducted.  Noted Robinson overlying erythema, induration, or other signs of local infection.  Skin prepped in a sterile fashion.  Local anesthesia: Topical Ethyl chloride.  With sterile technique and under real time ultrasound guidance: With a 25-gauge half inch needle injected with 0.5 cc of 0.5% Marcaine and 0.5 cc of Kenalog 40 mg/mL Completed without difficulty  Pain immediately resolved suggesting accurate placement of the medication.  Advised to call if fevers/chills, erythema, induration, drainage, or persistent bleeding.  Images permanently stored and available for review in the ultrasound unit.  Impression: Technically successful ultrasound guided injection.  Procedure: Real-time Ultrasound Guided Injection of prepatellar bursa Device: GE Logiq Q7 Ultrasound guided injection is preferred based studies that show increased duration, increased effect, greater accuracy, decreased procedural pain, increased response rate, and  decreased cost with ultrasound guided versus blind injection.  Verbal informed consent obtained.  Time-out conducted.  Noted Robinson overlying erythema, induration, or other signs of local infection.  Skin prepped in a sterile fashion.  Local anesthesia: Topical Ethyl chloride.  With sterile technique and under real time ultrasound guidance: With 18-gauge 1-1/2 inch needle patient was injected with 0.5 cc of 0.5% Marcaine and aspirated 25 cc of straw-colored fluid then injected 0.5 cc of Kenalog 40 mg/mL. Completed without difficulty  Pain immediately resolved suggesting accurate placement of the medication.  Advised to call if fevers/chills, erythema, induration, drainage, or persistent bleeding.  Images  permanently stored and available for review in the ultrasound unit.  Impression: Technically successful ultrasound guided injection.  Osteopathic findings  T9 extended rotated and side bent left L2 flexed rotated and side bent right Sacrum right on right    Impression and Recommendations:     This case required medical decision making of moderate complexity. The above documentation has been reviewed and is accurate and complete Lyndal Pulley, DO       Note: This dictation was prepared with Dragon dictation along with smaller phrase technology. Any transcriptional errors that result from this process are unintentional.

## 2019-06-13 ENCOUNTER — Ambulatory Visit (INDEPENDENT_AMBULATORY_CARE_PROVIDER_SITE_OTHER): Payer: No Typology Code available for payment source | Admitting: Family Medicine

## 2019-06-13 ENCOUNTER — Ambulatory Visit (INDEPENDENT_AMBULATORY_CARE_PROVIDER_SITE_OTHER): Payer: No Typology Code available for payment source

## 2019-06-13 ENCOUNTER — Other Ambulatory Visit: Payer: Self-pay

## 2019-06-13 ENCOUNTER — Encounter: Payer: Self-pay | Admitting: Family Medicine

## 2019-06-13 VITALS — BP 108/74 | Ht 63.0 in | Wt 123.0 lb

## 2019-06-13 DIAGNOSIS — M5412 Radiculopathy, cervical region: Secondary | ICD-10-CM | POA: Diagnosis not present

## 2019-06-13 DIAGNOSIS — M999 Biomechanical lesion, unspecified: Secondary | ICD-10-CM

## 2019-06-13 DIAGNOSIS — M1812 Unilateral primary osteoarthritis of first carpometacarpal joint, left hand: Secondary | ICD-10-CM

## 2019-06-13 DIAGNOSIS — G8929 Other chronic pain: Secondary | ICD-10-CM

## 2019-06-13 DIAGNOSIS — M79645 Pain in left finger(s): Secondary | ICD-10-CM | POA: Diagnosis not present

## 2019-06-13 DIAGNOSIS — M25461 Effusion, right knee: Secondary | ICD-10-CM

## 2019-06-13 DIAGNOSIS — M5090 Cervical disc disorder, unspecified, unspecified cervical region: Secondary | ICD-10-CM | POA: Diagnosis not present

## 2019-06-13 NOTE — Assessment & Plan Note (Signed)

## 2019-06-13 NOTE — Assessment & Plan Note (Signed)
Chronic with exacerbation.  Severe overall.  Discussed icing regimen and home exercise, which activities to do which was to avoid.  Increase activity slowly over the course the next several days.  Discussed icing regimen.  Bracing.  Worsening pain no patient unfortunately will need to consider the possibility of surgical replacement in the future.

## 2019-06-13 NOTE — Patient Instructions (Signed)
See me in 6-8 weeks [facility] will be calling you

## 2019-06-13 NOTE — Assessment & Plan Note (Signed)
Patient has responded short-term to the facet injections but I do think at this point we can consider the possibility of an epidural to provide more time.  Patient wants to avoid any surgical intervention but this is a chronic problem with another exacerbation.  Follow-up again after epidural 3 to 4 weeks.

## 2019-06-13 NOTE — Assessment & Plan Note (Signed)
Prepatellar bursa.  Seem to be hemorrhagic after aspiration.  Patient states that compression seem to cause it.  Encouraged her to monitor.  Likely will have some reaccumulation initially but then hopefully steroid will help.  Discussed icing regimen and home exercises.  Discussed avoiding contact in the area.  Follow-up again in 4 to 8 weeks

## 2019-07-01 ENCOUNTER — Inpatient Hospital Stay: Admission: RE | Admit: 2019-07-01 | Payer: No Typology Code available for payment source | Source: Ambulatory Visit

## 2019-07-04 ENCOUNTER — Other Ambulatory Visit: Payer: Self-pay | Admitting: Internal Medicine

## 2019-07-16 ENCOUNTER — Other Ambulatory Visit: Payer: Self-pay | Admitting: Internal Medicine

## 2019-07-16 NOTE — Telephone Encounter (Signed)
Refill request for Adrienne Robinson, last seen 06-09-19, last filled 04-19-19.  Please advise. Deleted duplicate order.

## 2019-07-16 NOTE — Telephone Encounter (Signed)
Pt states that the pharmacy told her that she would not get her refill until next Friday and she needs it before then. Please advise

## 2019-07-20 ENCOUNTER — Other Ambulatory Visit: Payer: Self-pay | Admitting: Internal Medicine

## 2019-07-22 ENCOUNTER — Ambulatory Visit
Admission: RE | Admit: 2019-07-22 | Discharge: 2019-07-22 | Disposition: A | Discharge status: Home / Self Care | Payer: No Typology Code available for payment source | Source: Ambulatory Visit | Attending: Family Medicine | Admitting: Family Medicine

## 2019-07-22 ENCOUNTER — Other Ambulatory Visit: Payer: Self-pay

## 2019-07-22 ENCOUNTER — Other Ambulatory Visit: Payer: Self-pay | Admitting: Internal Medicine

## 2019-07-22 DIAGNOSIS — M5412 Radiculopathy, cervical region: Secondary | ICD-10-CM

## 2019-07-22 MED ORDER — ZOLPIDEM TARTRATE 10 MG PO TABS: 10.0000 mg | ORAL_TABLET | Freq: Every evening | ORAL | 2 refills | Status: DC | PRN

## 2019-07-22 MED ORDER — TRIAMCINOLONE ACETONIDE 40 MG/ML IJ SUSP (RADIOLOGY)
60.0000 mg | Freq: Once | INTRAMUSCULAR | Status: AC
  Administered 2019-07-22: 60 mg via EPIDURAL

## 2019-07-22 MED ORDER — IOPAMIDOL (ISOVUE-M 300) INJECTION 61%
1.0000 mL | Freq: Once | INTRAMUSCULAR | Status: AC | PRN
  Administered 2019-07-22: 1 mL via EPIDURAL

## 2019-07-22 NOTE — Telephone Encounter (Signed)
May apologies ,, I did not see this until today,  And I cannot access the Controlled substance database for unclear reasons.  ambien refilled.

## 2019-07-22 NOTE — Discharge Instructions (Signed)

## 2019-07-24 ENCOUNTER — Other Ambulatory Visit: Payer: No Typology Code available for payment source

## 2019-07-26 ENCOUNTER — Other Ambulatory Visit: Payer: Self-pay | Admitting: Internal Medicine

## 2019-07-28 ENCOUNTER — Other Ambulatory Visit: Payer: Self-pay | Admitting: Physician Assistant

## 2019-07-29 ENCOUNTER — Other Ambulatory Visit: Payer: Self-pay | Admitting: Internal Medicine

## 2019-07-29 ENCOUNTER — Ambulatory Visit: Payer: No Typology Code available for payment source | Admitting: Internal Medicine

## 2019-07-29 ENCOUNTER — Encounter: Payer: Self-pay | Admitting: Internal Medicine

## 2019-07-29 VITALS — BP 134/70 | HR 60 | Ht 63.0 in | Wt 121.1 lb

## 2019-07-29 DIAGNOSIS — Z86010 Personal history of colonic polyps: Secondary | ICD-10-CM

## 2019-07-29 DIAGNOSIS — E538 Deficiency of other specified B group vitamins: Secondary | ICD-10-CM | POA: Diagnosis not present

## 2019-07-29 DIAGNOSIS — K589 Irritable bowel syndrome without diarrhea: Secondary | ICD-10-CM

## 2019-07-29 DIAGNOSIS — K6389 Other specified diseases of intestine: Secondary | ICD-10-CM

## 2019-07-29 DIAGNOSIS — K219 Gastro-esophageal reflux disease without esophagitis: Secondary | ICD-10-CM | POA: Diagnosis not present

## 2019-07-29 DIAGNOSIS — Z800 Family history of malignant neoplasm of digestive organs: Secondary | ICD-10-CM

## 2019-07-29 MED ORDER — ESOMEPRAZOLE MAGNESIUM 40 MG PO CPDR: 40.0000 mg | DELAYED_RELEASE_CAPSULE | Freq: Every day | ORAL | 1 refills | Status: DC

## 2019-07-29 NOTE — Progress Notes (Signed)
   Subjective:    Patient ID: Adrienne Robinson, female    DOB: [birth year], 64 y.o.   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  She was last seen by virtual visit on 06/02/2019.  At the time of her last visit we were treating her with rifaximin for abdominal bloating, epigastric pain, nausea and poor appetite.  She completed rifaximin therapy 14 days total in about a week after her symptoms resolved entirely.  She is very happy with the result.  She will occasionally have minor bloating but this is no longer an issue.  Her appetite has returned to normal and she has gained back a little bit of weight which she lost previously.  Bowel habits are regular.  No more nausea.  No more abdominal pain.  Her hair which has been thinning seems to be improving.  She is taking biotin, vitamin D and E.  She is continue Nexium 40 mg twice daily and is having no heartburn, indigestion or dysphagia symptom.   Review of Systems As per HPI, otherwise negative  Current Medications, Allergies, Past Medical History, Past Surgical History, Family History and Social History were reviewed in [employer] record.     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   1.  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  She is very happy with the result.  She did have cross-sectional imaging, CT scan which showed enteritis which was very likely infectious because a follow-up MR enterography did not show any further evidence of bowel wall thickening or inflammation.  She has responded very favorably to rifaximin with resolution of symptoms.  We discussed how we could retreat in the future if symptoms recur  2.  GERD/indigestion --completely controlled symptoms on twice daily Nexium, I do think it is reasonable to reduce dose back to 40 mg once daily.  I advised that she notify me if symptoms of heartburn and indigestion recur with dose reduction --Reduce Nexium to 40 mg daily  3.  B12 deficiency --she continues IM B12 at least every 14 days  4.  CRC screening/family history of colon cancer --family history of colon cancer warrant screening colonoscopy every 5 years.  She had a small adenoma in May 2018.  Repeat colonoscopy recommended in May 2023  20 minutes total spent today including patient facing time, coordination of care, reviewing medical history/procedures/pertinent radiology studies, and documentation of the encounter.

## 2019-07-29 NOTE — Patient Instructions (Signed)
Decrease Nexium dosing to once daily.  Please follow up with Dr Hilarie Fredrickson in 1 year.  If you are age 64 or older, your body mass index should be between 23-30. Your Body mass index is 21.46 kg/m. If this is out of the aforementioned range listed, please consider follow up with your Primary Care Provider.  If you are age 22 or younger, your body mass index should be between 19-25. Your Body mass index is 21.46 kg/m. If this is out of the aformentioned range listed, please consider follow up with your Primary Care Provider.   Due to recent changes in healthcare laws, you may see the results of your imaging and laboratory studies on MyChart before your provider has had a chance to review them.  We understand that in some cases there may be results that are confusing or concerning to you. Not all laboratory results come back in the same time frame and the provider may be waiting for multiple results in order to interpret others.  Please give [place] 48 hours in order for your provider to thoroughly review all the results before contacting the office for clarification of your results.

## 2019-08-06 ENCOUNTER — Ambulatory Visit: Payer: No Typology Code available for payment source | Admitting: Family Medicine

## 2019-08-27 ENCOUNTER — Encounter: Payer: Self-pay | Admitting: Dermatology

## 2019-08-27 ENCOUNTER — Ambulatory Visit: Payer: Managed Care, Other (non HMO) | Admitting: Dermatology

## 2019-08-27 ENCOUNTER — Telehealth (INDEPENDENT_AMBULATORY_CARE_PROVIDER_SITE_OTHER): Payer: No Typology Code available for payment source | Admitting: Internal Medicine

## 2019-08-27 ENCOUNTER — Other Ambulatory Visit: Payer: Self-pay

## 2019-08-27 ENCOUNTER — Encounter: Payer: Self-pay | Admitting: Internal Medicine

## 2019-08-27 VITALS — BP 130/70 | Ht 63.0 in | Wt 115.0 lb

## 2019-08-27 DIAGNOSIS — D225 Melanocytic nevi of trunk: Secondary | ICD-10-CM

## 2019-08-27 DIAGNOSIS — D1800 Hemangioma unspecified site: Secondary | ICD-10-CM

## 2019-08-27 DIAGNOSIS — Z85828 Personal history of other malignant neoplasm of skin: Secondary | ICD-10-CM

## 2019-08-27 DIAGNOSIS — L565 Disseminated superficial actinic porokeratosis (DSAP): Secondary | ICD-10-CM

## 2019-08-27 DIAGNOSIS — M47812 Spondylosis without myelopathy or radiculopathy, cervical region: Secondary | ICD-10-CM

## 2019-08-27 DIAGNOSIS — Z1283 Encounter for screening for malignant neoplasm of skin: Secondary | ICD-10-CM

## 2019-08-27 DIAGNOSIS — L821 Other seborrheic keratosis: Secondary | ICD-10-CM

## 2019-08-27 DIAGNOSIS — L578 Other skin changes due to chronic exposure to nonionizing radiation: Secondary | ICD-10-CM

## 2019-08-27 DIAGNOSIS — E559 Vitamin D deficiency, unspecified: Secondary | ICD-10-CM

## 2019-08-27 DIAGNOSIS — K2100 Gastro-esophageal reflux disease with esophagitis, without bleeding: Secondary | ICD-10-CM

## 2019-08-27 DIAGNOSIS — L918 Other hypertrophic disorders of the skin: Secondary | ICD-10-CM

## 2019-08-27 DIAGNOSIS — D485 Neoplasm of uncertain behavior of skin: Secondary | ICD-10-CM | POA: Diagnosis not present

## 2019-08-27 DIAGNOSIS — D708 Other neutropenia: Secondary | ICD-10-CM

## 2019-08-27 DIAGNOSIS — G894 Chronic pain syndrome: Secondary | ICD-10-CM

## 2019-08-27 DIAGNOSIS — L570 Actinic keratosis: Secondary | ICD-10-CM | POA: Diagnosis not present

## 2019-08-27 DIAGNOSIS — D229 Melanocytic nevi, unspecified: Secondary | ICD-10-CM

## 2019-08-27 DIAGNOSIS — L814 Other melanin hyperpigmentation: Secondary | ICD-10-CM

## 2019-08-27 DIAGNOSIS — I8310 Varicose veins of unspecified lower extremity with inflammation: Secondary | ICD-10-CM

## 2019-08-27 DIAGNOSIS — E538 Deficiency of other specified B group vitamins: Secondary | ICD-10-CM

## 2019-08-27 DIAGNOSIS — R140 Abdominal distension (gaseous): Secondary | ICD-10-CM

## 2019-08-27 DIAGNOSIS — I10 Essential (primary) hypertension: Secondary | ICD-10-CM

## 2019-08-27 HISTORY — DX: Actinic keratosis: L57.0

## 2019-08-27 MED ORDER — HYDROCODONE-ACETAMINOPHEN 10-325 MG PO TABS: 1.0000 | ORAL_TABLET | Freq: Four times a day (QID) | ORAL | 0 refills | Status: DC | PRN

## 2019-08-27 NOTE — Patient Instructions (Addendum)
Seborrheic Keratosis  What causes seborrheic keratoses? Seborrheic keratoses are harmless, common skin growths that first appear during adult life.  As time goes by, more growths appear.  Some people may develop a large number of them.  Seborrheic keratoses appear on both covered and uncovered body parts.  They are not caused by sunlight.  The tendency to develop seborrheic keratoses can be inherited.  They vary in color from skin-colored to gray, brown, or even black.  They can be either smooth or have a rough, warty surface.   Seborrheic keratoses are superficial and look as if they were stuck on the skin.  Under the microscope this type of keratosis looks like layers upon layers of skin.  That is why at times the top layer may seem to fall off, but the rest of the growth remains and re-grows.    Treatment Seborrheic keratoses do not need to be treated, but can easily be removed in the office.  Seborrheic keratoses often cause symptoms when they rub on clothing or jewelry.  Lesions can be in the way of shaving.  If they become inflamed, they can cause itching, soreness, or burning.  Removal of a seborrheic keratosis can be accomplished by freezing, burning, or surgery. If any spot bleeds, scabs, or grows rapidly, please return to have it checked, as these can be an indication of a skin cancer.  Shave Excision Benign Lesion Wound Care Instructions  . Leave the original bandage on for 24 hours if possible.  If the bandage becomes soaked or soiled before that time, it is OK to remove it and examine the wound.  A small amount of post-operative bleeding is normal.  If excessive bleeding occurs, remove the bandage, place gauze over the site and apply continuous pressure (no peeking) over the area for 20-30 minutes.  If this does not stop the bleeding, try again for 40 minutes.  If this does not work, please call our clinic as soon as possible (even if after-hours).    . Twice a day, cleanse the wound with  soap and water.  If a thick crust develops you may use a Q-tip dipped into dilute hydrogen peroxide (mix 1:1 with water) to dissolve it.  Hydrogen peroxide can slow the healing process, so use it only as needed.  After washing, apply Vaseline jelly or Polysporin ointment.  For best healing, the wound should be covered with a layer of ointment at all times.  This may mean re-applying the ointment several times a day.  For open wounds, continue until it has healed.    . If you have any swelling, keep the area elevated.  . Some redness, tenderness and white or yellow material in the wound is normal healing.  If the area becomes very sore and red, or develops a thick yellow-green material (pus), it may be infected; please notify us.    . Wound healing continues for up to one year following surgery.  It is not unusual to experience pain in the scar from time to time during the interval.  If the pain becomes severe or the scar thickens, you should notify the office.  A slight amount of redness in a scar is expected for the first six months.  After six months, the redness subsides and the scar will soften and fade.  The color difference becomes less noticeable with time.  If there are any problems, return for a post-op surgery check at your earliest convenience.  . Please call our office  for any questions or concerns.  Cryotherapy Aftercare  . Wash gently with soap and water everyday.   Marland Kitchen Apply Vaseline and Band-Aid daily until healed.  Melanoma ABCDEs  Melanoma is the most dangerous type of skin cancer, and is the leading cause of death from skin disease.  You are more likely to develop melanoma if you:  Have light-colored skin, light-colored eyes, or red or blond hair  Spend a lot of time in the sun  Tan regularly, either outdoors or in a tanning bed  Have had blistering sunburns, especially during childhood  Have a close family member who has had a melanoma  Have atypical moles or large  birthmarks  Early detection of melanoma is key since treatment is typically straightforward and cure rates are extremely high if we catch it early.   The first sign of melanoma is often a change in a mole or a new dark spot.  The ABCDE system is a way of remembering the signs of melanoma.  A for asymmetry:  The two halves do not match. B for border:  The edges of the growth are irregular. C for color:  A mixture of colors are present instead of an even brown color. D for diameter:  Melanomas are usually (but not always) greater than 29mm - the size of a pencil eraser. E for evolution:  The spot keeps changing in size, shape, and color.  Please check your skin once per month between visits. You can use a small mirror in front and a large mirror behind you to keep an eye on the back side or your body.   If you see any new or changing lesions before your next follow-up, please call to schedule a visit.  Please continue daily skin protection including broad spectrum sunscreen SPF 30+ to sun-exposed areas, reapplying every 2 hours as needed when you're outdoors.

## 2019-08-27 NOTE — Progress Notes (Signed)
Telephone Note  This visit type was conducted due to national recommendations for restrictions regarding the COVID-19 pandemic (e.g. social distancing).  This format is felt to be most appropriate for this patient at this time.  All issues noted in this document were discussed and addressed.  No physical exam was performed (except for noted visual exam findings with Video Visits).   I connected with@ on 08/27/19 at  8:00 AM EDT by  telephone and verified that I am speaking with the correct person using two identifiers. Location patient: home Location provider: work or home office Persons participating in the virtual visit: patient, provider  I discussed the limitations, risks, security and privacy concerns of performing an evaluation and management service by telephone and the availability of in person appointments. I also discussed with the patient that there may be a patient responsible charge related to this service. The patient expressed understanding and agreed to proceed.   Reason for visit:  Follow up on  multiple issuesL   HPI:  Chronic pain:  Had ESI in neck June 1 by Dr Jeralyn Ruths, No change ,  Constant pain due to cervical DDD and spondylosis   GI issues finally resolved finally after 2 weeks of Rifaximin.  Had significant weight loss. Now resolved.   Telogen effluvium :started vitamin D , C and E ,  Secondary to weight loss from GI.  Aneurysm: has not had MRI or imaging in over a year.  No follow up since COIVD  OVERDUE FOR MAMMOGRAM.     ROS: See pertinent positives and negatives per HPI.  Past Medical History:  Diagnosis Date  . Allergy   . Arthritis   . 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 B12 deficiency 01/2016  . Candida esophagitis ([place])   . Degenerative joint disease involving multiple joints   . Depression   . Dizziness   . Hypertension   . Pneumonia    approx 6 years ago  . Scoliosis   . Spinal  stenosis of lumbar region   . Squamous cell carcinoma of skin 06/24/2018   R lateral calf  . Tubular adenoma of colon     Past Surgical History:  Procedure Laterality Date  . ABDOMINAL HYSTERECTOMY    . APPENDECTOMY    . CARDIAC CATHETERIZATION     normal coronaries, no wall motion abnormalities 11/02/09 [facility])  . NASAL SINUS SURGERY    . OOPHORECTOMY    . RADIOLOGY WITH ANESTHESIA  02/26/2012   Procedure: RADIOLOGY WITH ANESTHESIA;  Surgeon: [name], MD;  Location: [place];  Service: Radiology;  Laterality: N/A;  . REVERSE SHOULDER ARTHROPLASTY Right 06/27/2013   Procedure: REVERSE RIGHT TOTAL SHOULDER ARTHROPLASTY;  Surgeon: [name], MD;  Location: [place];  Service: Orthopedics;  Laterality: Right;  . SHOULDER ARTHROSCOPY WITH ROTATOR CUFF REPAIR AND SUBACROMIAL DECOMPRESSION  2007   left  . TONSILLECTOMY  adnoids    Family History  Problem Relation Age of Onset  . Colon cancer Father 57  . Diabetes Father   . Heart disease Father   . Rectal cancer Father   . Thyroid disease Mother   . Colon cancer Paternal Grandfather   . Colon cancer Other        pggm  . Breast cancer Paternal Grandmother   . Stomach cancer Neg Hx     SOCIAL HX:  reports that she has never smoked. She has never used smokeless tobacco.  She reports current alcohol use of about 2.0 standard drinks of alcohol per week. She reports that she does not use drugs.   Current Outpatient Medications:  .  amLODipine (NORVASC) 10 MG tablet, TAKE 1 TABLET BY MOUTH EVERY DAY, Disp: 90 tablet, Rfl: 1 .  buPROPion (WELLBUTRIN XL) 150 MG 24 hr tablet, TAKE 3 TABLETS (450 MG TOTAL) BY MOUTH DAILY., Disp: 90 tablet, Rfl: 11 .  cholecalciferol (VITAMIN D3) 25 MCG (1000 UNIT) tablet, Take 1,000 Units by mouth daily., Disp: , Rfl:  .  cyanocobalamin (,VITAMIN B-12,) 1000 MCG/ML injection, Inject 1 mL (1,000 mcg total) into the muscle once a week., Disp: 10 mL, Rfl: 11 .  esomeprazole (NEXIUM) 40 MG capsule, Take  1 capsule (40 mg total) by mouth daily., Disp: 180 capsule, Rfl: 1 .  fluticasone (FLONASE) 50 MCG/ACT nasal spray, USE 2 SPRAYS IN EACH NOSTRIL DAILY AS NEEDED, Disp: 16 g, Rfl: 1 .  [START ON 09/10/2019] HYDROcodone-acetaminophen (NORCO) 10-325 MG tablet, Take 1 tablet by mouth every 6 (six) hours as needed for severe pain., Disp: 120 tablet, Rfl: 0 .  [START ON 10/10/2019] HYDROcodone-acetaminophen (NORCO) 10-325 MG tablet, Take 1 tablet by mouth every 6 (six) hours as needed for severe pain., Disp: 120 tablet, Rfl: 0 .  metoprolol succinate (TOPROL-XL) 25 MG 24 hr tablet, TAKE 1 TABLET BY MOUTH EVERY DAY, Disp: 90 tablet, Rfl: 1 .  Syringe/Needle, Disp, (SYRINGE 3CC/25GX1") 25G X 1" 3 ML MISC, Use for b12 injections, Disp: 50 each, Rfl: 0 .  vitamin E 1000 UNIT capsule, Take 1,000 Units by mouth daily., Disp: , Rfl:  .  zolpidem (AMBIEN) 10 MG tablet, Take 1 tablet (10 mg total) by mouth at bedtime as needed. for sleep, Disp: 30 tablet, Rfl: 2  EXAM:   General impression: alert, cooperative and articulate.  No signs of being in distress  Lungs: speech is fluent sentence length suggests that patient is not short of breath and not punctuated by cough, sneezing or sniffing. Marland Kitchen   Psych: affect normal.  speech is articulate and non pressured .  Denies suicidal thoughts ,  pleasant and cooperative, no obvious depression or anxiety, speech and thought processing grossly intact  ASSESSMENT AND PLAN:  Discussed the following assessment and plan:  Essential hypertension - Plan: Comprehensive metabolic panel, Lipid panel, Microalbumin / creatinine urine ratio  Other neutropenia (HCC) - Plan: TSH, CBC with Differential/Platelet  B12 deficiency  Vitamin D deficiency - Plan: VITAMIN D 25 Hydroxy (Vit-D Deficiency, Fractures)  Chronic reflux esophagitis  Cervical spondylosis without myelopathy  Chronic pain syndrome  Abdominal bloating  Chronic reflux esophagitis Managed currently with nexium  24 once daily   Cervical spondylosis without myelopathy No change in pain level after ESI done June 1 by Dr Jeralyn Ruths.  encouraged to contact MD for follow up  Chronic pain syndrome She has chronic back pain Complicated by scoliosis. She has persistent shoulder pain despite prior reconstruction.  celebrex has been stopped due to CKD and prednisone taper used in the recent past to  manage recent increase in neck pain.   .She has not had any ER visits. Her Refill history was confirmed via [place] Controlled Substance database by me today during her visit and there have been no prescriptions of controlled substances filled from any providers other than me. .Will refill for July August and September   Abdominal bloating Workup by GI with MRI negative for malignancy .  Gastroparesis suspected  But symptoms resolved after taking2 week  course of rifaxamin     I discussed the assessment and treatment plan with the patient. The patient was provided an opportunity to ask questions and all were answered. The patient agreed with the plan and demonstrated an understanding of the instructions.   The patient was advised to call back or seek an in-person evaluation if the symptoms worsen or if the condition fails to improve as anticipated.  I provided  25 minutes of non-face-to-face time during this encounter.   [name], MD

## 2019-08-27 NOTE — Assessment & Plan Note (Signed)
Workup by GI with MRI negative for malignancy .  Gastroparesis suspected  But symptoms resolved after taking2 week course of rifaxamin

## 2019-08-27 NOTE — Assessment & Plan Note (Signed)
Managed currently with nexium 24 once daily

## 2019-08-27 NOTE — Assessment & Plan Note (Signed)
No change in pain level after ESI done June 1 by Dr Jeralyn Ruths.  encouraged to contact MD for follow up

## 2019-08-27 NOTE — Progress Notes (Signed)
Follow-Up Visit   Subjective  Adrienne Robinson is a 64 y.o. female who presents for the following: Annual Exam (patient has a history of SCC on the R lat calf ).  The following portions of the chart were reviewed this encounter and updated as appropriate:     Review of Systems:  No other skin or systemic complaints except as noted in HPI or Assessment and Plan.  Objective  Well appearing patient in no apparent distress; mood and affect are within normal limits.  A full examination was performed including scalp, head, eyes, ears, nose, lips, neck, chest, axillae, abdomen, back, buttocks, bilateral upper extremities, bilateral lower extremities, hands, feet, fingers, toes, fingernails, and toenails. All findings within normal limits unless otherwise noted below.  Objective  Left Popliteal Fossa: 1.0 x 0.6 cm brown macule with speckled pigmentation      Objective  Left Breast x 1, R shoulder x 1, L upper arm x 1, L jaw x 1, R pretibia x 3, L pretibia x 6, L thigh x 1, R calf x 1 (14): Erythematous thin papules/macules with gritty scale.   Objective  B/L leg: Pink/brown scaly macules some with keratotic rim  Objective  Back, abd: Regular brown macules  Images      Objective  R lat calf: Scar is clear  Assessment & Plan  Neoplasm of uncertain behavior of skin Left Popliteal Fossa  Skin / nail biopsy Type of biopsy: tangential   Informed consent: discussed and consent obtained   Anesthesia: the lesion was anesthetized in a standard fashion   Anesthesia comment:  Area prepped with alcohol Anesthetic:  1% lidocaine w/ epinephrine 1-100,000 buffered w/ 8.4% NaHCO3 Instrument used: flexible razor blade   Hemostasis achieved with: pressure and aluminum chloride   Outcome: patient tolerated procedure well   Post-procedure details: wound care instructions given   Post-procedure details comment:  Ointment and small bandage applied  Specimen 1 - Surgical  pathology Differential Diagnosis: D48.5 r/o lentigo vs SK vs CA  Check Margins: No 1.0 x 0.6 cm brown macule with speckled pigmentation  AK (actinic keratosis) (14) Left Breast x 1, R shoulder x 1, L upper arm x 1, L jaw x 1, R pretibia x 3, L pretibia x 6, L thigh x 1, R calf x 1  Hypertrophic  Plan 5FU/vitamin D topical treatment during the fall and winter months on the legs  Destruction of lesion - Left Breast x 1, R shoulder x 1, L upper arm x 1, L jaw x 1, R pretibia x 3, L pretibia x 6, L thigh x 1, R calf x 1  Destruction method: cryotherapy   Informed consent: discussed and consent obtained   Lesion destroyed using liquid nitrogen: Yes   Region frozen until ice ball extended beyond lesion: Yes   Outcome: patient tolerated procedure well with no complications   Post-procedure details: wound care instructions given    Disseminated superficial actinic porokeratosis (DSAP) B/L leg  Observe Recommend daily broad spectrum sunscreen SPF 30+ to sun-exposed areas, reapply every 2 hours as needed. Call for new or changing lesions.  Melanocytic nevi of trunk Back, abd  Benign-appearing.  Observation.  Call clinic for new or changing moles.  Recommend daily use of broad spectrum spf 30+ sunscreen to sun-exposed areas.    History of SCC (squamous cell carcinoma) of skin R lat calf  Clear. Observe for recurrence. Call clinic for new or changing lesions.  Recommend regular skin exams, daily broad-spectrum  spf 30+ sunscreen use, and photoprotection.      Lentigines - Scattered tan macules - Discussed due to sun exposure - Benign, observe - Call for any changes  Seborrheic Keratoses - Stuck-on, waxy, tan-brown papules and plaques  - Discussed benign etiology and prognosis. - Observe - Call for any changes  Melanocytic Nevi - Tan-brown and/or pink-flesh-colored symmetric macules and papules - Benign appearing on exam today - Observation - Call clinic for new or changing  moles - Recommend daily use of broad spectrum spf 30+ sunscreen to sun-exposed areas.   Hemangiomas - Red papules - Discussed benign nature - Observe - Call for any changes  Actinic Damage - diffuse scaly erythematous macules with underlying dyspigmentation - Recommend daily broad spectrum sunscreen SPF 30+ to sun-exposed areas, reapply every 2 hours as needed.  - Call for new or changing lesions.  Varicose Veins - Dilated blue, purple or red veins at the lower extremities - Reassured - These can be treated by sclerotherapy (a procedure to inject a medicine into the veins to make them disappear) if desired, but the treatment is not covered by insurance  Acrochordons (Skin Tags) - Fleshy, skin-colored pedunculated papules - Benign appearing.  - Observe. - If desired, they can be removed with an in office procedure that is not covered by insurance. - Please call the clinic if you notice any new or changing lesions.  Skin cancer screening performed today.  Return in about 7 months (around 03/29/2020) for f/up AKs, Dsap legs.  Luther Redo, CMA, am acting as scribe for Brendolyn Patty, MD .  Documentation: I have reviewed the above documentation for accuracy and completeness, and I agree with the above.  Brendolyn Patty MD

## 2019-08-27 NOTE — Assessment & Plan Note (Signed)
She has chronic back pain Complicated by scoliosis. She has persistent shoulder pain despite prior reconstruction.  celebrex has been stopped due to CKD and prednisone taper used in the recent past to  manage recent increase in neck pain.   .She has not had any ER visits. Her Refill history was confirmed via [place] Controlled Substance database by me today during her visit and there have been no prescriptions of controlled substances filled from any providers other than me. .Will refill for July August and September

## 2019-09-01 ENCOUNTER — Telehealth: Payer: Self-pay

## 2019-09-01 ENCOUNTER — Other Ambulatory Visit: Payer: Self-pay | Admitting: Internal Medicine

## 2019-09-01 NOTE — Telephone Encounter (Signed)
-----   Message from Brendolyn Patty, MD sent at 08/28/2019  5:46 PM EDT ----- Skin , left popliteal fossa ACTINIC KERATOSIS AND SOLAR LENTIGO  Precancer with freckle (from sun).  If recurs, will treat with Ln2.  Will recheck at 6 mo f/up.

## 2019-09-01 NOTE — Telephone Encounter (Signed)
Advised pt of bx results/sh ?

## 2019-09-04 NOTE — Telephone Encounter (Signed)
Okay to repeat treatment with rifaximin 550 mg 3 times daily x14 days Please notify patient of the same

## 2019-09-05 ENCOUNTER — Other Ambulatory Visit: Payer: Self-pay

## 2019-09-05 DIAGNOSIS — K589 Irritable bowel syndrome without diarrhea: Secondary | ICD-10-CM

## 2019-09-05 DIAGNOSIS — K6389 Other specified diseases of intestine: Secondary | ICD-10-CM

## 2019-09-05 MED ORDER — RIFAXIMIN 550 MG PO TABS: 550.0000 mg | ORAL_TABLET | Freq: Three times a day (TID) | ORAL | 0 refills | Status: AC

## 2019-09-11 ENCOUNTER — Encounter: Payer: Self-pay | Admitting: Family Medicine

## 2019-09-11 ENCOUNTER — Ambulatory Visit: Payer: Managed Care, Other (non HMO) | Admitting: Family Medicine

## 2019-09-11 ENCOUNTER — Other Ambulatory Visit: Payer: Self-pay

## 2019-09-11 DIAGNOSIS — M999 Biomechanical lesion, unspecified: Secondary | ICD-10-CM | POA: Diagnosis not present

## 2019-09-11 DIAGNOSIS — M064 Inflammatory polyarthropathy: Secondary | ICD-10-CM | POA: Diagnosis not present

## 2019-09-11 DIAGNOSIS — M48061 Spinal stenosis, lumbar region without neurogenic claudication: Secondary | ICD-10-CM

## 2019-09-11 DIAGNOSIS — G894 Chronic pain syndrome: Secondary | ICD-10-CM

## 2019-09-11 DIAGNOSIS — M1812 Unilateral primary osteoarthritis of first carpometacarpal joint, left hand: Secondary | ICD-10-CM | POA: Diagnosis not present

## 2019-09-11 MED ORDER — VILAZODONE HCL 10 MG PO TABS: 10.0000 mg | ORAL_TABLET | Freq: Every day | ORAL | 1 refills | Status: DC

## 2019-09-11 NOTE — Progress Notes (Signed)
[facility] [address] [place] Phone: [phone] Subjective:   I Adrienne Robinson am serving as a Education administrator for Dr. Hulan Saas.  This visit occurred during the SARS-CoV-2 public health emergency.  Safety protocols were in place, including screening questions prior to the visit, additional usage of staff PPE, and extensive cleaning of exam room while observing appropriate contact time as indicated for disinfecting solutions.   I'm seeing this patient by the request  of:  [name], MD  CC: Neck pain, abdominal pain follow-up  WGN:[id number]   06/13/2019 Chronic with exacerbation.  Severe overall.  Discussed icing regimen and home exercise, which activities to do which was to avoid.  Increase activity slowly over the course the next several days.  Discussed icing regimen.  Bracing.  Worsening pain no patient unfortunately will need to consider the possibility of surgical replacement in the future.  Prepatellar bursa.  Seem to be hemorrhagic after aspiration.  Patient states that compression seem to cause it.  Encouraged her to monitor.  Likely will have some reaccumulation initially but then hopefully steroid will help.  Discussed icing regimen and home exercises.  Discussed avoiding contact in the area.  Follow-up again in 4 to 8 weeks  09/11/2019 Adrienne Robinson is a 64 y.o. female coming in with complaint of left thumb and knee pain. Last seen 06/13/2019 for OMT.   Patient does also have chronic pain.  Is on narcotics from primary care provider.   Patient had undergone an epidural at [facility] for chronic neck pain on June 1.  Patient states  Past Medical History:  Diagnosis Date  . Allergy   . Arthritis   . 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 B12 deficiency 01/2016  . Candida esophagitis ([place])   . Degenerative joint disease involving multiple joints   . Depression   .  Dizziness   . Hypertension   . Pneumonia    approx 6 years ago  . Scoliosis   . Spinal stenosis of lumbar region   . Squamous cell carcinoma of skin 06/24/2018   R lateral calf  . Tubular adenoma of colon    Past Surgical History:  Procedure Laterality Date  . ABDOMINAL HYSTERECTOMY    . APPENDECTOMY    . CARDIAC CATHETERIZATION     normal coronaries, no wall motion abnormalities 11/02/09 [facility])  . NASAL SINUS SURGERY    . OOPHORECTOMY    . RADIOLOGY WITH ANESTHESIA  02/26/2012   Procedure: RADIOLOGY WITH ANESTHESIA;  Surgeon: [name], MD;  Location: [place];  Service: Radiology;  Laterality: N/A;  . REVERSE SHOULDER ARTHROPLASTY Right 06/27/2013   Procedure: REVERSE RIGHT TOTAL SHOULDER ARTHROPLASTY;  Surgeon: [name], MD;  Location: [place];  Service: Orthopedics;  Laterality: Right;  . SHOULDER ARTHROSCOPY WITH ROTATOR CUFF REPAIR AND SUBACROMIAL DECOMPRESSION  2007   left  . TONSILLECTOMY  adnoids   Social History   Socioeconomic History  . Marital status: Married    Spouse name: Not on file  . Number of children: 0  . Years of education: 38  . Highest education level: Not on file  Occupational History  . Occupation: Nurse executive   Tobacco Use  . Smoking status: Never Smoker  . Smokeless tobacco: Never Used  Vaping Use  . Vaping Use: Never used  Substance and Sexual Activity  . Alcohol use: Yes  Alcohol/week: 2.0 standard drinks    Types: 2 Glasses of wine per week    Comment: 2-3 week with dinner  . Drug use: No  . Sexual activity: Not on file  Other Topics Concern  . Not on file  Social History Narrative   Lives at home with her husband.   Right-handed.   2-3 diet cokes per day.   Social Determinants of Health   Financial Resource Strain:   . Difficulty of Paying Living Expenses:   Food Insecurity:   . Worried About Charity fundraiser in the Last Year:   . Arboriculturist in the Last Year:   Transportation Needs:   . Lexicographer (Medical):   Marland Kitchen Lack of Transportation (Non-Medical):   Physical Activity:   . Days of Exercise per Week:   . Minutes of Exercise per Session:   Stress:   . Feeling of Stress :   Social Connections:   . Frequency of Communication with Friends and Family:   . Frequency of Social Gatherings with Friends and Family:   . Attends Religious Services:   . Active Member of Clubs or Organizations:   . Attends Archivist Meetings:   Marland Kitchen Marital Status:    Allergies  Allergen Reactions  . Adhesive [Tape] Rash and Other (See Comments)    Redness, blisters, skin peeling off.  . Morphine And Related Rash   Family History  Problem Relation Age of Onset  . Colon cancer Father 11  . Diabetes Father   . Heart disease Father   . Rectal cancer Father   . Thyroid disease Mother   . Colon cancer Paternal Grandfather   . Colon cancer Other        pggm  . Breast cancer Paternal Grandmother   . Stomach cancer Neg Hx      Current Outpatient Medications (Cardiovascular):  .  amLODipine (NORVASC) 10 MG tablet, TAKE 1 TABLET BY MOUTH EVERY DAY .  metoprolol succinate (TOPROL-XL) 25 MG 24 hr tablet, TAKE 1 TABLET BY MOUTH EVERY DAY  Current Outpatient Medications (Respiratory):  .  fluticasone (FLONASE) 50 MCG/ACT nasal spray, USE 2 SPRAYS IN EACH NOSTRIL DAILY AS NEEDED  Current Outpatient Medications (Analgesics):  .  HYDROcodone-acetaminophen (NORCO) 10-325 MG tablet, Take 1 tablet by mouth every 6 (six) hours as needed for severe pain. Derrill Memo ON 10/10/2019] HYDROcodone-acetaminophen (NORCO) 10-325 MG tablet, Take 1 tablet by mouth every 6 (six) hours as needed for severe pain. (Patient not taking: Reported on 08/27/2019)  Current Outpatient Medications (Hematological):  .  cyanocobalamin (,VITAMIN B-12,) 1000 MCG/ML injection, Inject 1 mL (1,000 mcg total) into the muscle once a week.  Current Outpatient Medications (Other):  Marland Kitchen  buPROPion (WELLBUTRIN XL) 150 MG 24 hr  tablet, TAKE 3 TABLETS (450 MG TOTAL) BY MOUTH DAILY. .  cholecalciferol (VITAMIN D3) 25 MCG (1000 UNIT) tablet, Take 1,000 Units by mouth daily. Marland Kitchen  esomeprazole (NEXIUM) 40 MG capsule, Take 1 capsule (40 mg total) by mouth daily. .  Multiple Vitamin (MULTIVITAMIN) tablet, Take 1 tablet by mouth daily. .  rifaximin (XIFAXAN) 550 MG TABS tablet, Take 1 tablet (550 mg total) by mouth 3 (three) times daily for 14 days. .  Syringe/Needle, Disp, (SYRINGE 3CC/25GX1") 25G X 1" 3 ML MISC, Use for b12 injections .  Vilazodone HCl (VIIBRYD) 10 MG TABS, Take 1 tablet (10 mg total) by mouth daily. .  vitamin E 1000 UNIT capsule, Take 1,000 Units by  mouth daily. Marland Kitchen  zolpidem (AMBIEN) 10 MG tablet, Take 1 tablet (10 mg total) by mouth at bedtime as needed. for sleep   Reviewed prior external information including notes and imaging from  primary care provider As well as notes that were available from care everywhere and other healthcare systems.  Past medical history, social, surgical and family history all reviewed in electronic medical record.  No pertanent information unless stated regarding to the chief complaint.   Review of Systems:  No headache, visual changes, nausea, vomiting, diarrhea, constipation, dizziness, abdominal pain, skin rash, fevers, chills, night sweats, weight loss, swollen lymph nodes, , chest pain, shortness of breath, mood changes. POSITIVE muscle aches, body aches, joint swelling  Objective    General: No apparent distress alert and oriented x3 mood and affect normal, dressed appropriately.  HEENT: Pupils equal, extraocular movements intact  Respiratory: Patient's speak in full sentences and does not appear short of breath  Cardiovascular: No lower extremity edema, non tender, no erythema  Neuro: Cranial nerves II through XII are intact, neurovascularly intact in all extremities with 2+ DTRs and 2+ pulses.  Gait normal with good balance and coordination.  MSK: Patient's low  back exam does have tightness noted in the paraspinal musculature.  Tender to palpation in the paraspinal musculature at the sacroiliac joint bilaterally.  Patient does have some tightness in the thoracolumbar juncture as well.  Neck exam tightness noted.  Crepitus noted.  Positive Spurling's with right side Left CMC joint does have significant swelling noted.  Positive grind test.  Mild weakness noted with 4 out of 5 grip strength.  After verbal consent patient was prepped with alcohol swabs and with a 25-gauge half inch needle injected into the left [facility] joint with a total of 0.5 cc of 0.5% Marcaine and 0.5 cc of Kenalog 40 mg/mL  Osteopathic findings C4 flexed rotated and side bent left T5 extended rotated and side bent right inhaled rib T9 extended rotated and side bent left L2 flexed rotated and side bent left Sacrum right on right     Impression and Recommendations:     The above documentation has been reviewed and is accurate and complete Lyndal Pulley, DO       Note: This dictation was prepared with Dragon dictation along with smaller phrase technology. Any transcriptional errors that result from this process are unintentional.

## 2019-09-11 NOTE — Assessment & Plan Note (Signed)
Exacerbation.  Started vilazodone.  Warned of potential side effects.  Patient has tried Cymbalta previously but it made her tired.  Patient is on Wellbutrin and warned to potentially consider cutting back to 300 mg did not reiterate in the office visit is much as I would like so would you call patient.  Follow-up again 6 to 8 weeks

## 2019-09-11 NOTE — Assessment & Plan Note (Signed)
Severe overall.  Patient wants to avoid any surgical intervention versus significant effusion still noted.  Patient continues to be active though.  Increase activities, icing regimen, follow-up again in 4 to 8 weeks

## 2019-09-11 NOTE — Patient Instructions (Addendum)
Good to see you Medication sent in Injection done today See me again in 6-8 weeks

## 2019-09-11 NOTE — Assessment & Plan Note (Signed)

## 2019-09-12 ENCOUNTER — Telehealth: Payer: Self-pay | Admitting: Family Medicine

## 2019-09-12 NOTE — Telephone Encounter (Signed)
Patient called stating that the pharmacy was having issues with the Vybrid prescription. They assumed that it would need a PA. Almyra Free said that she mentioned it to [place] but did not know the status. Can you help with this?

## 2019-09-17 ENCOUNTER — Other Ambulatory Visit: Payer: Self-pay | Admitting: Internal Medicine

## 2019-09-25 ENCOUNTER — Other Ambulatory Visit: Payer: Self-pay | Admitting: Internal Medicine

## 2019-10-02 ENCOUNTER — Encounter: Payer: Self-pay | Admitting: Family Medicine

## 2019-10-02 ENCOUNTER — Other Ambulatory Visit: Payer: Self-pay

## 2019-10-02 DIAGNOSIS — M47812 Spondylosis without myelopathy or radiculopathy, cervical region: Secondary | ICD-10-CM

## 2019-10-02 DIAGNOSIS — M5010 Cervical disc disorder with radiculopathy, unspecified cervical region: Secondary | ICD-10-CM

## 2019-10-14 ENCOUNTER — Ambulatory Visit
Admission: RE | Admit: 2019-10-14 | Discharge: 2019-10-14 | Disposition: A | Discharge status: Home / Self Care | Payer: Managed Care, Other (non HMO) | Source: Ambulatory Visit | Attending: Family Medicine | Admitting: Family Medicine

## 2019-10-14 ENCOUNTER — Other Ambulatory Visit: Payer: Self-pay

## 2019-10-14 DIAGNOSIS — M47812 Spondylosis without myelopathy or radiculopathy, cervical region: Secondary | ICD-10-CM

## 2019-10-14 MED ORDER — TRIAMCINOLONE ACETONIDE 40 MG/ML IJ SUSP (RADIOLOGY)
60.0000 mg | Freq: Once | INTRAMUSCULAR | Status: AC
  Administered 2019-10-14: 60 mg via EPIDURAL

## 2019-10-14 MED ORDER — IOPAMIDOL (ISOVUE-M 300) INJECTION 61%
1.0000 mL | Freq: Once | INTRAMUSCULAR | Status: AC | PRN
  Administered 2019-10-14: 1 mL via EPIDURAL

## 2019-10-14 NOTE — Discharge Instructions (Addendum)
Post Procedure Spinal Discharge Instruction Sheet  1. You may resume a regular diet and any medications that you routinely take (including pain medications).  2. No driving day of procedure.  3. Light activity throughout the rest of the day.  Do not do any strenuous work, exercise, bending or lifting.  The day following the procedure, you can resume normal physical activity but you should refrain from exercising or physical therapy for at least three days thereafter.   Common Side Effects:   Headaches- take your usual medications as directed by your physician.  Increase your fluid intake.  Caffeinated beverages may be helpful.  Lie flat in bed until your headache resolves.   Restlessness or inability to sleep- you may have trouble sleeping for the next few days.  Ask your referring physician if you need any medication for sleep.   Facial flushing or redness- should subside within a few days.   Increased pain- a temporary increase in pain a day or two following your procedure is not unusual.  Take your pain medication as prescribed by your referring physician.   Leg cramps  Please contact our office at [phone] for the following symptoms:  Fever greater than 100 degrees.  Headaches unresolved with medication after 2-3 days. Increased swelling, pain, or redness at injection site.Post Procedure Spinal Discharge Instruction Sheet  You may resume a regular diet and any medications that you routinely take (including pain medications).  No driving day of procedure.  Light activity throughout the rest of the day.  Do not do any strenuous work, exercise, bending or lifting.  The day following the procedure, you can resume normal physical activity but you should refrain from exercising or physical therapy for at least three days thereafter.   Common Side Effects:  Headaches- take your usual medications as directed by your physician.  Increase your fluid intake.  Caffeinated  beverages may be helpful.  Lie flat in bed until your headache resolves.  Restlessness or inability to sleep- you may have trouble sleeping for the next few days.  Ask your referring physician if you need any medication for sleep.  Facial flushing or redness- should subside within a few days.  Increased pain- a temporary increase in pain a day or two following your procedure is not unusual.  Take your pain medication as prescribed by your referring physician.  Leg cramps  Please contact our office at [phone] for the following symptoms: Fever greater than 100 degrees. Headaches unresolved with medication after 2-3 days.  Increased swelling, pain, or redness at injection site.

## 2019-10-22 ENCOUNTER — Telehealth (INDEPENDENT_AMBULATORY_CARE_PROVIDER_SITE_OTHER): Payer: Managed Care, Other (non HMO) | Admitting: Internal Medicine

## 2019-10-22 ENCOUNTER — Encounter: Payer: Self-pay | Admitting: Internal Medicine

## 2019-10-22 DIAGNOSIS — M159 Polyosteoarthritis, unspecified: Secondary | ICD-10-CM

## 2019-10-22 DIAGNOSIS — M8949 Other hypertrophic osteoarthropathy, multiple sites: Secondary | ICD-10-CM

## 2019-10-22 DIAGNOSIS — N1831 Chronic kidney disease, stage 3a: Secondary | ICD-10-CM

## 2019-10-22 DIAGNOSIS — M47812 Spondylosis without myelopathy or radiculopathy, cervical region: Secondary | ICD-10-CM

## 2019-10-22 DIAGNOSIS — I10 Essential (primary) hypertension: Secondary | ICD-10-CM

## 2019-10-22 DIAGNOSIS — R140 Abdominal distension (gaseous): Secondary | ICD-10-CM

## 2019-10-22 NOTE — Assessment & Plan Note (Signed)
Well controlled on current regimen by home readings . Marland Kitchen Renal function stable, no changes today.

## 2019-10-22 NOTE — Assessment & Plan Note (Signed)
She deferred nephrology evaluation due to husband's surgery  Repeat cr was normal in September 2020

## 2019-10-22 NOTE — Assessment & Plan Note (Signed)
Secondary to IBS.  Relieved by use of Rifaxamin followed by use of a daily pre/probiotic supplement called "Morning Complete"

## 2019-10-22 NOTE — Progress Notes (Signed)
Virtual Visit via [place]  This visit type was conducted due to national recommendations for restrictions regarding the COVID-19 pandemic (e.g. social distancing).  This format is felt to be most appropriate for this patient at this time.  All issues noted in this document were discussed and addressed.  No physical exam was performed (except for noted visual exam findings with Video Visits).   I connected with@ on 10/22/19 at  8:00 AM EDT by a video enabled telemedicine application and verified that I am speaking with the correct person using two identifiers. Location patient: home Location provider: work or home office Persons participating in the virtual visit: patient, provider  I discussed the limitations, risks, security and privacy concerns of performing an evaluation and management service by telephone and the availability of in person appointments. I also discussed with the patient that there may be a patient responsible charge related to this service. The patient expressed understanding and agreed to proceed.  Reason for visit:  medication refill  HPI: 64 yr old female with hypertension, depression,  IBS,chronic pain secondary to DJD and DDD presents for med refill  IBS: was tread with 2 rounds of Rifaxamine with good results. Now taking a daily pre/probiotic supplement called "Morning Activate" which reduces bloating   Neck pain: recently became severe and unrelenting.  Finally under control 5 days after an ESI.   COVID BOOSTER:  rec getting in December  Due for labs and overdue for mammogram which has been ordered  ROS: Patient denies headache, fevers, malaise, unintentional weight loss, skin rash, eye pain, sinus congestion and sinus pain, sore throat, dysphagia,  hemoptysis , cough, dyspnea, wheezing, chest pain, palpitations, orthopnea, edema, abdominal pain, nausea, melena, diarrhea, constipation, flank pain, dysuria, hematuria, urinary  Frequency, nocturia, numbness,  tingling, seizures,  Focal weakness, Loss of consciousness,  Tremor, insomnia, depression, anxiety, and suicidal ideation.     Past Medical History:  Diagnosis Date  . Allergy   . Arthritis   . 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 B12 deficiency 01/2016  . Candida esophagitis ([place])   . Degenerative joint disease involving multiple joints   . Depression   . Dizziness   . Hypertension   . Pneumonia    approx 6 years ago  . Scoliosis   . Spinal stenosis of lumbar region   . Squamous cell carcinoma of skin 06/24/2018   R lateral calf  . Tubular adenoma of colon     Past Surgical History:  Procedure Laterality Date  . ABDOMINAL HYSTERECTOMY    . APPENDECTOMY    . CARDIAC CATHETERIZATION     normal coronaries, no wall motion abnormalities 11/02/09 [facility])  . NASAL SINUS SURGERY    . OOPHORECTOMY    . RADIOLOGY WITH ANESTHESIA  02/26/2012   Procedure: RADIOLOGY WITH ANESTHESIA;  Surgeon: [name], MD;  Location: [place];  Service: Radiology;  Laterality: N/A;  . REVERSE SHOULDER ARTHROPLASTY Right 06/27/2013   Procedure: REVERSE RIGHT TOTAL SHOULDER ARTHROPLASTY;  Surgeon: [name], MD;  Location: [place];  Service: Orthopedics;  Laterality: Right;  . SHOULDER ARTHROSCOPY WITH ROTATOR CUFF REPAIR AND SUBACROMIAL DECOMPRESSION  2007   left  . TONSILLECTOMY  adnoids    Family History  Problem Relation Age of Onset  . Colon cancer Father 92  . Diabetes Father   . Heart disease Father   . Rectal cancer Father   . Thyroid disease Mother   .  Colon cancer Paternal Grandfather   . Colon cancer Other        pggm  . Breast cancer Paternal Grandmother   . Stomach cancer Neg Hx     SOCIAL HX:  reports that she has never smoked. She has never used smokeless tobacco. She reports current alcohol use of about 2.0 standard drinks of alcohol per week. She reports that she does not use drugs.   Current Outpatient  Medications:  .  amLODipine (NORVASC) 10 MG tablet, TAKE 1 TABLET BY MOUTH EVERY DAY, Disp: 90 tablet, Rfl: 1 .  buPROPion (WELLBUTRIN XL) 150 MG 24 hr tablet, TAKE 3 TABLETS (450 MG TOTAL) BY MOUTH DAILY., Disp: 270 tablet, Rfl: 4 .  cholecalciferol (VITAMIN D3) 25 MCG (1000 UNIT) tablet, Take 1,000 Units by mouth daily., Disp: , Rfl:  .  cyanocobalamin (,VITAMIN B-12,) 1000 MCG/ML injection, INJECT 1 ML (1,000 MCG TOTAL) INTO THE MUSCLE ONCE A WEEK., Disp: 12 mL, Rfl: 6 .  esomeprazole (NEXIUM) 40 MG capsule, Take 1 capsule (40 mg total) by mouth daily., Disp: 180 capsule, Rfl: 1 .  fluticasone (FLONASE) 50 MCG/ACT nasal spray, USE 2 SPRAYS IN EACH NOSTRIL DAILY AS NEEDED, Disp: 16 g, Rfl: 1 .  HYDROcodone-acetaminophen (NORCO) 10-325 MG tablet, Take 1 tablet by mouth every 6 (six) hours as needed for severe pain., Disp: 120 tablet, Rfl: 0 .  metoprolol succinate (TOPROL-XL) 25 MG 24 hr tablet, TAKE 1 TABLET BY MOUTH EVERY DAY, Disp: 90 tablet, Rfl: 1 .  Multiple Vitamin (MULTIVITAMIN) tablet, Take 1 tablet by mouth daily., Disp: , Rfl:  .  Syringe/Needle, Disp, (SYRINGE 3CC/25GX1") 25G X 1" 3 ML MISC, Use for b12 injections, Disp: 50 each, Rfl: 0 .  Vilazodone HCl (VIIBRYD) 10 MG TABS, Take 1 tablet (10 mg total) by mouth daily., Disp: 30 tablet, Rfl: 1 .  vitamin E 1000 UNIT capsule, Take 1,000 Units by mouth daily., Disp: , Rfl:  .  zolpidem (AMBIEN) 10 MG tablet, Take 1 tablet (10 mg total) by mouth at bedtime as needed. for sleep, Disp: 30 tablet, Rfl: 2  EXAM:  VITALS per patient if applicable:  GENERAL: alert, oriented, appears well and in no acute distress  HEENT: atraumatic, conjunttiva clear, no obvious abnormalities on inspection of external nose and ears  NECK: normal movements of the head and neck  LUNGS: on inspection no signs of respiratory distress, breathing rate appears normal, no obvious gross SOB, gasping or wheezing  CV: no obvious cyanosis  MS: moves all visible  extremities without noticeable abnormality  PSYCH/NEURO: pleasant and cooperative, no obvious depression or anxiety, speech and thought processing grossly intact  ASSESSMENT AND PLAN:  Discussed the following assessment and plan:  Abdominal bloating  Cervical spondylosis without myelopathy  Stage 3a chronic kidney disease  Primary osteoarthritis involving multiple joints  Essential hypertension  Abdominal bloating Secondary to IBS.  Relieved by use of Rifaxamin followed by use of a daily pre/probiotic supplement called "Morning Complete"   Cervical spondylosis without myelopathy Recent increase in pain resolved with facet joint injections   Chronic kidney disease, stage 3, mod decreased GFR She deferred nephrology evaluation due to husband's surgery  Repeat cr was normal in September 2020  Degenerative joint disease involving multiple joints She has chronic back pain Complicated by scoliosis. She has persistent shoulder pain despite prior reconstruction.  celebrex has been stopped due to CKD and prednisone taper used in the recent past to  manage recent increase in neck pain.   Marland Kitchen  She has not had any ER visits. Her Refill history was confirmed via [place] Controlled Substance database by me today during her visit and there have been no prescriptions of controlled substances filled from any providers other than me. .Will refill for Sept Oct and Nov  Hypertension Well controlled on current regimen by home readings . Marland Kitchen Renal function stable, no changes today.    I discussed the assessment and treatment plan with the patient. The patient was provided an opportunity to ask questions and all were answered. The patient agreed with the plan and demonstrated an understanding of the instructions.   The patient was advised to call back or seek an in-person evaluation if the symptoms worsen or if the condition fails to improve as anticipated.  I provided 30 minutes of face-to-face time during this  encounter.   [name], MD

## 2019-10-22 NOTE — Assessment & Plan Note (Signed)
She has chronic back pain Complicated by scoliosis. She has persistent shoulder pain despite prior reconstruction.  celebrex has been stopped due to CKD and prednisone taper used in the recent past to  manage recent increase in neck pain.   .She has not had any ER visits. Her Refill history was confirmed via [place] Controlled Substance database by me today during her visit and there have been no prescriptions of controlled substances filled from any providers other than me. .Will refill for Sept Oct and Nov

## 2019-10-22 NOTE — Assessment & Plan Note (Signed)
Recent increase in pain resolved with facet joint injections

## 2019-10-23 ENCOUNTER — Ambulatory Visit: Payer: Managed Care, Other (non HMO) | Admitting: Family Medicine

## 2019-10-23 ENCOUNTER — Other Ambulatory Visit: Payer: Self-pay | Admitting: Internal Medicine

## 2019-10-24 NOTE — Telephone Encounter (Signed)
Refill request for Adrienne Robinson, last seen 10-22-19, last filled 07-22-19.  Please advise.

## 2019-11-07 ENCOUNTER — Other Ambulatory Visit: Payer: Self-pay | Admitting: Internal Medicine

## 2019-11-07 ENCOUNTER — Other Ambulatory Visit: Payer: Self-pay | Admitting: Family Medicine

## 2019-11-07 MED ORDER — HYDROCODONE-ACETAMINOPHEN 10-325 MG PO TABS: 1.0000 | ORAL_TABLET | Freq: Four times a day (QID) | ORAL | 0 refills | Status: DC | PRN

## 2019-11-07 NOTE — Telephone Encounter (Signed)
Patient called in about her refill on HYDROcodone-acetaminophen (NORCO) 10-325 MG tablet she has check with pharmacy and they don't have

## 2019-11-13 ENCOUNTER — Other Ambulatory Visit: Payer: Self-pay | Admitting: Internal Medicine

## 2019-11-13 DIAGNOSIS — Z1231 Encounter for screening mammogram for malignant neoplasm of breast: Secondary | ICD-10-CM

## 2019-11-18 ENCOUNTER — Other Ambulatory Visit: Payer: Self-pay | Admitting: Internal Medicine

## 2019-11-18 DIAGNOSIS — N1831 Chronic kidney disease, stage 3a: Secondary | ICD-10-CM

## 2019-12-03 ENCOUNTER — Other Ambulatory Visit: Payer: Self-pay

## 2019-12-03 ENCOUNTER — Ambulatory Visit
Admission: RE | Admit: 2019-12-03 | Discharge: 2019-12-03 | Disposition: A | Discharge status: Home / Self Care | Payer: Managed Care, Other (non HMO) | Source: Ambulatory Visit | Attending: Internal Medicine | Admitting: Internal Medicine

## 2019-12-03 DIAGNOSIS — Z1231 Encounter for screening mammogram for malignant neoplasm of breast: Secondary | ICD-10-CM

## 2019-12-09 NOTE — Progress Notes (Signed)
[facility] [place] Phone: [phone] Subjective:   Adrienne Robinson, am serving as a scribe for Dr. Hulan Saas.  I'm seeing this patient by the request  of:  [name], MD  CC: Back pain, neck pain, left thumb pain  [id number]   09/11/2019 Exacerbation.  Started vilazodone.  Warned of potential side effects.  Patient has tried Cymbalta previously but it made her tired.  Patient is on Wellbutrin and warned to potentially consider cutting back to 300 mg did not reiterate in the office visit is much as I would like so would you call patient.  Follow-up again 6 to 8 weeks  Severe overall.  Patient wants to avoid any surgical intervention versus significant effusion still noted.  Patient continues to be active though.  Increase activities, icing regimen, follow-up again in 4 to 8 weeks   Update 12/09/2019 Adrienne Robinson is a 64 y.o. female coming in with complaint of neck pain and knee pain. Patient states neck today hurts a little, worse when sitting at her desk. States epidural worked for three weeks this time. States due for a manipulation.  Usually does manipulation of the lower back.  Does have some left thumb pain states usually gets that injected.  Patient's last injection was in April.  Starting to have increasing discomfort and pain again.  Increasing swelling.  Affecting daily activities.  Known severe arthritic changes noted.  Played touch football with her nephews and fell and states that the L knee feels sore and achy at times but other than that is okay. No swelling, not painful to the touch, and is fine otherwise.  Patient states okay just wants it checked out.       Past Medical History:  Diagnosis Date  . Allergy   . Arthritis   . 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 B12 deficiency 01/2016  . Candida esophagitis ([place])   . Degenerative  joint disease involving multiple joints   . Depression   . Dizziness   . Hypertension   . Pneumonia    approx 6 years ago  . Scoliosis   . Spinal stenosis of lumbar region   . Squamous cell carcinoma of skin 06/24/2018   R lateral calf  . Tubular adenoma of colon    Past Surgical History:  Procedure Laterality Date  . ABDOMINAL HYSTERECTOMY    . APPENDECTOMY    . CARDIAC CATHETERIZATION     normal coronaries, no wall motion abnormalities 11/02/09 [facility])  . NASAL SINUS SURGERY    . OOPHORECTOMY    . RADIOLOGY WITH ANESTHESIA  02/26/2012   Procedure: RADIOLOGY WITH ANESTHESIA;  Surgeon: [name], MD;  Location: [facility];  Service: Radiology;  Laterality: N/A;  . REVERSE SHOULDER ARTHROPLASTY Right 06/27/2013   Procedure: REVERSE RIGHT TOTAL SHOULDER ARTHROPLASTY;  Surgeon: [name], MD;  Location: [place];  Service: Orthopedics;  Laterality: Right;  . SHOULDER ARTHROSCOPY WITH ROTATOR CUFF REPAIR AND SUBACROMIAL DECOMPRESSION  2007   left  . TONSILLECTOMY  adnoids   Social History   Socioeconomic History  . Marital status: Married    Spouse name: Not on file  . Number of children: 0  . Years of education: 56  . Highest education level: Not on file  Occupational History  . Occupation: Nurse executive   Tobacco Use  . Smoking status: Never Smoker  .  Smokeless tobacco: Never Used  Vaping Use  . Vaping Use: Never used  Substance and Sexual Activity  . Alcohol use: Yes    Alcohol/week: 2.0 standard drinks    Types: 2 Glasses of wine per week    Comment: 2-3 week with dinner  . Drug use: No  . Sexual activity: Not on file  Other Topics Concern  . Not on file  Social History Narrative   Lives at home with her husband.   Right-handed.   2-3 diet cokes per day.   Social Determinants of Health   Financial Resource Strain:   . Difficulty of Paying Living Expenses: Not on file  Food Insecurity:   . Worried About Charity fundraiser in the Last Year: Not on file   . Ran Out of Food in the Last Year: Not on file  Transportation Needs:   . Lack of Transportation (Medical): Not on file  . Lack of Transportation (Non-Medical): Not on file  Physical Activity:   . Days of Exercise per Week: Not on file  . Minutes of Exercise per Session: Not on file  Stress:   . Feeling of Stress : Not on file  Social Connections:   . Frequency of Communication with Friends and Family: Not on file  . Frequency of Social Gatherings with Friends and Family: Not on file  . Attends Religious Services: Not on file  . Active Member of Clubs or Organizations: Not on file  . Attends Archivist Meetings: Not on file  . Marital Status: Not on file   Allergies  Allergen Reactions  . Adhesive [Tape] Rash and Other (See Comments)    Redness, blisters, skin peeling off.  . Morphine And Related Rash   Family History  Problem Relation Age of Onset  . Colon cancer Father 68  . Diabetes Father   . Heart disease Father   . Rectal cancer Father   . Thyroid disease Mother   . Colon cancer Paternal Grandfather   . Colon cancer Other        pggm  . Breast cancer Paternal Grandmother   . Stomach cancer Neg Hx      Current Outpatient Medications (Cardiovascular):  .  amLODipine (NORVASC) 10 MG tablet, TAKE 1 TABLET BY MOUTH EVERY DAY .  metoprolol succinate (TOPROL-XL) 25 MG 24 hr tablet, TAKE 1 TABLET BY MOUTH EVERY DAY  Current Outpatient Medications (Respiratory):  .  fluticasone (FLONASE) 50 MCG/ACT nasal spray, USE 2 SPRAYS IN EACH NOSTRIL DAILY AS NEEDED  Current Outpatient Medications (Analgesics):  .  [START ON 01/08/2020] HYDROcodone-acetaminophen (NORCO) 10-325 MG tablet, Take 1 tablet by mouth every 6 (six) hours as needed for severe pain. Marland Kitchen  HYDROcodone-acetaminophen (NORCO) 10-325 MG tablet, Take 1 tablet by mouth every 6 (six) hours as needed for severe pain.  Current Outpatient Medications (Hematological):  .  cyanocobalamin (,VITAMIN B-12,) 1000  MCG/ML injection, INJECT 1 ML (1,000 MCG TOTAL) INTO THE MUSCLE ONCE A WEEK.  Current Outpatient Medications (Other):  Marland Kitchen  buPROPion (WELLBUTRIN XL) 150 MG 24 hr tablet, TAKE 3 TABLETS (450 MG TOTAL) BY MOUTH DAILY. .  cholecalciferol (VITAMIN D3) 25 MCG (1000 UNIT) tablet, Take 1,000 Units by mouth daily. Marland Kitchen  esomeprazole (NEXIUM) 40 MG capsule, Take 1 capsule (40 mg total) by mouth daily. .  Multiple Vitamin (MULTIVITAMIN) tablet, Take 1 tablet by mouth daily. .  Syringe/Needle, Disp, (SYRINGE 3CC/25GX1") 25G X 1" 3 ML MISC, Use for b12 injections .  VIIBRYD  10 MG TABS, TAKE 1 TABLET BY MOUTH EVERY DAY .  vitamin E 1000 UNIT capsule, Take 1,000 Units by mouth daily. Marland Kitchen  zolpidem (AMBIEN) 10 MG tablet, TAKE 1 TABLET (10 MG TOTAL) BY MOUTH AT BEDTIME AS NEEDED. FOR SLEEP   Reviewed prior external information including notes and imaging from  primary care provider As well as notes that were available from care everywhere and other healthcare systems.  Past medical history, social, surgical and family history all reviewed in electronic medical record.  No pertanent information unless stated regarding to the chief complaint.   Review of Systems:  No headache, visual changes, nausea, vomiting, diarrhea, constipation, dizziness, abdominal pain, skin rash, fevers, chills, night sweats, weight loss, swollen lymph nodes, body aches, joint swelling, chest pain, shortness of breath, mood changes. POSITIVE muscle aches  Objective  Blood pressure 120/82, pulse 61, height 5\' 3"  (1.6 m), weight 128 lb (58.1 kg), SpO2 98 %.   General: No apparent distress alert and oriented x3 mood and affect normal, dressed appropriately.  HEENT: Pupils equal, extraocular movements intact  Respiratory: Patient's speak in full sentences and does not appear short of breath  Cardiovascular: No lower extremity edema, non tender, no erythema  Gait normal with good balance and coordination.  MSK: Low back exam does have some  mild loss of lordosis with some degenerative scoliosis.  Patient is tight with FABER test bilaterally.  Pain in the thoracolumbar junction noted as well. Negative straight leg test.  Neck exam shows the patient does have loss of lordosis with degenerative crepitus noted.  Patient has minimal sidebending.  Lacks last 10 degrees of extension.  Left CMC joint significant effusion noted.  Positive grind test.  No thenar eminence wasting noted.  Procedure: Real-time Ultrasound Guided Injection of left CMC joint Device: GE Logiq Q7 Ultrasound guided injection is preferred based studies that show increased duration, increased effect, greater accuracy, decreased procedural pain, increased response rate, and decreased cost with ultrasound guided versus blind injection.  Verbal informed consent obtained.  Time-out conducted.  Noted no overlying erythema, induration, or other signs of local infection.  Skin prepped in a sterile fashion.  Local anesthesia: Topical Ethyl chloride.  With sterile technique and under real time ultrasound guidance: With a 25-gauge half inch needle injected into the left [facility] joint with a total of 0.5 cc of 0.5% Marcaine and 0.5 cc of Kenalog 40 mg/mL. Completed without difficulty  Pain immediately resolved suggesting accurate placement of the medication.  Advised to call if fevers/chills, erythema, induration, drainage, or persistent bleeding.  Impression: Technically successful ultrasound guided injection.   Osteopathic findings  T11 extended rotated and side bent left L2 flexed rotated and side bent right Sacrum right on right    Impression and Recommendations:     The above documentation has been reviewed and is accurate and complete Lyndal Pulley, DO

## 2019-12-10 ENCOUNTER — Encounter: Payer: Self-pay | Admitting: Family Medicine

## 2019-12-10 ENCOUNTER — Ambulatory Visit: Payer: Self-pay

## 2019-12-10 ENCOUNTER — Ambulatory Visit: Payer: Managed Care, Other (non HMO) | Admitting: Family Medicine

## 2019-12-10 ENCOUNTER — Other Ambulatory Visit: Payer: Self-pay

## 2019-12-10 VITALS — BP 120/82 | HR 61 | Ht 63.0 in | Wt 128.0 lb

## 2019-12-10 DIAGNOSIS — M1812 Unilateral primary osteoarthritis of first carpometacarpal joint, left hand: Secondary | ICD-10-CM

## 2019-12-10 DIAGNOSIS — M48061 Spinal stenosis, lumbar region without neurogenic claudication: Secondary | ICD-10-CM

## 2019-12-10 DIAGNOSIS — M999 Biomechanical lesion, unspecified: Secondary | ICD-10-CM

## 2019-12-10 DIAGNOSIS — M47812 Spondylosis without myelopathy or radiculopathy, cervical region: Secondary | ICD-10-CM

## 2019-12-10 DIAGNOSIS — M25562 Pain in left knee: Secondary | ICD-10-CM

## 2019-12-10 NOTE — Patient Instructions (Addendum)
Injected thumb The neck is the neck See me in 6-8 weeks

## 2019-12-10 NOTE — Assessment & Plan Note (Signed)
Significant arthritic changes.  Still avoids any type of manipulation in this area significantly.  Patient has responded to the epidurals but only last weeks.  Otherwise would need surgical intervention which patient is adamant she would like to avoid.  No change in any other medications.  Follow-up again 4 to 8 weeks

## 2019-12-10 NOTE — Assessment & Plan Note (Signed)
Repeat injection given again today.  Significant swelling noted today.  Patient still does not want any surgical intervention.  Likely will need replacement at some point in the future.  Patient would like to continue with these injections intermittently at this time.  Discussed icing regimen and home exercise, discussed bracing at night follow-up again 6-8 wweeks

## 2019-12-10 NOTE — Assessment & Plan Note (Signed)

## 2019-12-10 NOTE — Assessment & Plan Note (Signed)
Chronic problem.  Could respond well to the epidurals again.  Patient does respond well to manipulation at this point time.  Patient is doing better with the Viibryd for the polyarthropathy.  No change in medications at this time.  Patient is still taking the Wellbutrin and will discuss with primary care if this is okay with her.

## 2019-12-22 ENCOUNTER — Other Ambulatory Visit: Payer: Self-pay | Admitting: Internal Medicine

## 2019-12-22 MED ORDER — METAXALONE 800 MG PO TABS: 800.0000 mg | ORAL_TABLET | Freq: Three times a day (TID) | ORAL | 2 refills | Status: DC

## 2019-12-29 ENCOUNTER — Other Ambulatory Visit: Payer: Self-pay

## 2019-12-29 MED ORDER — RIFAXIMIN 550 MG PO TABS: 550.0000 mg | ORAL_TABLET | Freq: Three times a day (TID) | ORAL | 0 refills | Status: DC

## 2019-12-29 NOTE — Telephone Encounter (Signed)
Ok to retreat with rifaximin 550 mg tid x 14 days IBS Thanks JMP

## 2020-01-06 NOTE — Telephone Encounter (Signed)
Patient wants a call back to discuss her mychart message

## 2020-01-07 ENCOUNTER — Other Ambulatory Visit: Payer: Self-pay | Admitting: Internal Medicine

## 2020-01-07 MED ORDER — PREDNISONE 10 MG PO TABS: ORAL_TABLET | ORAL | 0 refills | Status: DC

## 2020-01-07 NOTE — Telephone Encounter (Signed)
Pt called to follow up on mychart

## 2020-01-13 MED ORDER — PREDNISONE 10 MG PO TABS: ORAL_TABLET | ORAL | 0 refills | Status: DC

## 2020-01-16 ENCOUNTER — Other Ambulatory Visit: Payer: Self-pay | Admitting: Internal Medicine

## 2020-01-21 ENCOUNTER — Encounter: Payer: Self-pay | Admitting: Internal Medicine

## 2020-01-21 ENCOUNTER — Other Ambulatory Visit: Payer: Self-pay

## 2020-01-21 ENCOUNTER — Telehealth (INDEPENDENT_AMBULATORY_CARE_PROVIDER_SITE_OTHER): Payer: Managed Care, Other (non HMO) | Admitting: Internal Medicine

## 2020-01-21 DIAGNOSIS — R140 Abdominal distension (gaseous): Secondary | ICD-10-CM

## 2020-01-21 DIAGNOSIS — I10 Essential (primary) hypertension: Secondary | ICD-10-CM

## 2020-01-21 DIAGNOSIS — M47812 Spondylosis without myelopathy or radiculopathy, cervical region: Secondary | ICD-10-CM

## 2020-01-21 DIAGNOSIS — G894 Chronic pain syndrome: Secondary | ICD-10-CM

## 2020-01-21 MED ORDER — PREDNISONE 10 MG PO TABS: ORAL_TABLET | ORAL | 0 refills | Status: DC

## 2020-01-21 MED ORDER — HYDROCODONE-ACETAMINOPHEN 10-325 MG PO TABS: 1.0000 | ORAL_TABLET | Freq: Four times a day (QID) | ORAL | 0 refills | Status: DC | PRN

## 2020-01-21 NOTE — Assessment & Plan Note (Signed)
Currently under treatment by GI Pyrtle with rifaxamin (2nd round of 14 days) for IB S

## 2020-01-21 NOTE — Assessment & Plan Note (Signed)
Well controlled on amlodipine 10 mg daily  by home readings . Marland Kitchen Renal function due, no changes today.

## 2020-01-21 NOTE — Assessment & Plan Note (Signed)
She has chronic back pain Complicated by scoliosis. She has persistent shoulder pain despite prior reconstruction.  celebrex has been stopped due to CKD and prednisone taper used in the recent past to  manage recent increase in neck pain.   .She has not had any ER visits. Her Refill history was confirmed via [place] Controlled Substance database by me today during her visit and there have been no prescriptions of controlled substances filled from any providers other than me. .Will refill for Dec Jan and Feb

## 2020-01-21 NOTE — Assessment & Plan Note (Signed)
Repeating prednisone taper for the third time.  Imaging needed if pain not resolved.

## 2020-01-21 NOTE — Progress Notes (Signed)
Virtual Visit via [facility]  This visit type was conducted due to national recommendations for restrictions regarding the COVID-19 pandemic (e.g. social distancing).  This format is felt to be most appropriate for this patient at this time.  All issues noted in this document were discussed and addressed.  No physical exam was performed (except for noted visual exam findings with Video Visits).   I connected with@ on 01/21/20 at  4:30 PM EST by a video enabled telemedicine applicationand verified that I am speaking with the correct person using two identifiers. Location patient: home Location provider: work or home office Persons participating in the virtual visit: patient, provider  I discussed the limitations, risks, security and privacy concerns of performing an evaluation and management service by telephone and the availability of in person appointments. I also discussed with the patient that there may be a patient responsible charge related to this service. The patient expressed understanding and agreed to proceed.  Reason for visit: med refill   HPI:  64 yr old female with chronic pain involving multiple sites sites due to OA and DDD managed with stable doses of hydrocodone presents for follow up.  She has been seeing a chiropractor 2-3 times per week for the last month to manage a recent back injury that occurred due to a lifting incident.  She has had 2 short courses of prednisone with transient results.  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  Sleeps on her back due to right shoulder replacement surgery .  Requesting one more round of prednisone.  No sciatica.  Also having increased neck pain despite trial of ESI in the C7-T1 intralaminar space in  August .  Using a neck support at night.   IBS:  She is on her 2nd round of rifaxamin for IBS.  HTN:  Taking amlodipine,  BP has been < 130/80  ROS: See pertinent positives and  negatives per HPI.  Past Medical History:  Diagnosis Date  . Allergy   . Arthritis   . 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 B12 deficiency 01/2016  . Candida esophagitis ([place])   . Degenerative joint disease involving multiple joints   . Depression   . Dizziness   . Hypertension   . Pneumonia    approx 6 years ago  . Scoliosis   . Spinal stenosis of lumbar region   . Squamous cell carcinoma of skin 06/24/2018   R lateral calf  . Tubular adenoma of colon     Past Surgical History:  Procedure Laterality Date  . ABDOMINAL HYSTERECTOMY    . APPENDECTOMY    . CARDIAC CATHETERIZATION     normal coronaries, no wall motion abnormalities 11/02/09 [facility])  . NASAL SINUS SURGERY    . OOPHORECTOMY    . RADIOLOGY WITH ANESTHESIA  02/26/2012   Procedure: RADIOLOGY WITH ANESTHESIA;  Surgeon: [name], MD;  Location: [place];  Service: Radiology;  Laterality: N/A;  . REVERSE SHOULDER ARTHROPLASTY Right 06/27/2013   Procedure: REVERSE RIGHT TOTAL SHOULDER ARTHROPLASTY;  Surgeon: [name], MD;  Location: [place];  Service: Orthopedics;  Laterality: Right;  . SHOULDER ARTHROSCOPY WITH ROTATOR CUFF REPAIR AND SUBACROMIAL DECOMPRESSION  2007   left  . TONSILLECTOMY  adnoids    Family History  Problem Relation Age of Onset  . Colon cancer Father 36  . Diabetes Father   .  Heart disease Father   . Rectal cancer Father   . Thyroid disease Mother   . Colon cancer Paternal Grandfather   . Colon cancer Other        pggm  . Breast cancer Paternal Grandmother   . Stomach cancer Neg Hx     SOCIAL HX:  reports that she has never smoked. She has never used smokeless tobacco. She reports current alcohol use of about 2.0 standard drinks of alcohol per week. She reports that she does not use drugs.  Current Outpatient Medications:  .  amLODipine (NORVASC) 10 MG tablet, TAKE 1 TABLET BY MOUTH EVERY DAY, Disp: 90 tablet, Rfl: 1 .   buPROPion (WELLBUTRIN XL) 150 MG 24 hr tablet, TAKE 3 TABLETS (450 MG TOTAL) BY MOUTH DAILY. (Patient taking differently: Take 150 mg by mouth daily. ), Disp: 270 tablet, Rfl: 4 .  cholecalciferol (VITAMIN D3) 25 MCG (1000 UNIT) tablet, Take 1,000 Units by mouth daily., Disp: , Rfl:  .  cyanocobalamin (,VITAMIN B-12,) 1000 MCG/ML injection, INJECT 1 ML (1,000 MCG TOTAL) INTO THE MUSCLE ONCE A WEEK., Disp: 12 mL, Rfl: 6 .  esomeprazole (NEXIUM) 40 MG capsule, Take 1 capsule (40 mg total) by mouth daily., Disp: 180 capsule, Rfl: 1 .  fluticasone (FLONASE) 50 MCG/ACT nasal spray, USE 2 SPRAYS IN EACH NOSTRIL DAILY AS NEEDED, Disp: 16 g, Rfl: 1 .  [START ON 02/07/2020] HYDROcodone-acetaminophen (NORCO) 10-325 MG tablet, Take 1 tablet by mouth every 6 (six) hours as needed for severe pain., Disp: 120 tablet, Rfl: 0 .  metaxalone (SKELAXIN) 800 MG tablet, Take 1 tablet (800 mg total) by mouth 3 (three) times daily., Disp: 30 tablet, Rfl: 2 .  metoprolol succinate (TOPROL-XL) 25 MG 24 hr tablet, TAKE 1 TABLET BY MOUTH EVERY DAY, Disp: 90 tablet, Rfl: 1 .  Multiple Vitamin (MULTIVITAMIN) tablet, Take 1 tablet by mouth daily., Disp: , Rfl:  .  rifaximin (XIFAXAN) 550 MG TABS tablet, Take 1 tablet (550 mg total) by mouth 3 (three) times daily., Disp: 42 tablet, Rfl: 0 .  Syringe/Needle, Disp, (SYRINGE 3CC/25GX1") 25G X 1" 3 ML MISC, Use for b12 injections, Disp: 50 each, Rfl: 0 .  vitamin E 1000 UNIT capsule, Take 1,000 Units by mouth daily., Disp: , Rfl:  .  zolpidem (AMBIEN) 10 MG tablet, TAKE 1 TABLET (10 MG TOTAL) BY MOUTH AT BEDTIME AS NEEDED. FOR SLEEP, Disp: 30 tablet, Rfl: 2 .  [START ON 03/08/2020] HYDROcodone-acetaminophen (NORCO) 10-325 MG tablet, Take 1 tablet by mouth every 6 (six) hours as needed for severe pain., Disp: 120 tablet, Rfl: 0 .  [START ON 04/07/2020] HYDROcodone-acetaminophen (NORCO) 10-325 MG tablet, Take 1 tablet by mouth every 6 (six) hours as needed for severe pain., Disp: 120  tablet, Rfl: 0 .  predniSONE (DELTASONE) 10 MG tablet, 6 tablets daily for 3 days, then reduce by 1 tablet daily until gone, Disp: 33 tablet, Rfl: 0  EXAM:  VITALS per patient if applicable:  GENERAL: alert, oriented, appears well and in no acute distress  HEENT: atraumatic, conjunttiva clear, no obvious abnormalities on inspection of external nose and ears  NECK: normal movements of the head and neck  LUNGS: on inspection no signs of respiratory distress, breathing rate appears normal, no obvious gross SOB, gasping or wheezing  CV: no obvious cyanosis  MS: moves all visible extremities without noticeable abnormality  PSYCH/NEURO: pleasant and cooperative, no obvious depression or anxiety, speech and thought processing grossly intact  ASSESSMENT AND PLAN:  Discussed the following assessment and plan:  Primary hypertension  Chronic pain syndrome  Cervical spondylosis without myelopathy  Abdominal bloating  Hypertension Well controlled on amlodipine 10 mg daily  by home readings . Marland Kitchen Renal function due, no changes today.  Chronic pain syndrome She has chronic back pain Complicated by scoliosis. She has persistent shoulder pain despite prior reconstruction.  celebrex has been stopped due to CKD and prednisone taper used in the recent past to  manage recent increase in neck pain.   .She has not had any ER visits. Her Refill history was confirmed via [place] Controlled Substance database by me today during her visit and there have been no prescriptions of controlled substances filled from any providers other than me. .Will refill for Dec Jan and Feb   Cervical spondylosis without myelopathy Repeating prednisone taper for the third time.  Imaging needed if pain not resolved.   Abdominal bloating Currently under treatment by GI Pyrtle with rifaxamin (2nd round of 14 days) for IB S    I discussed the assessment and treatment plan with the patient. The patient was provided an  opportunity to ask questions and all were answered. The patient agreed with the plan and demonstrated an understanding of the instructions.   The patient was advised to call back or seek an in-person evaluation if the symptoms worsen or if the condition fails to improve as anticipated.  I provided  30  minutes of face-to-face time during this encounter.   Adrienne Mc, MD

## 2020-01-22 ENCOUNTER — Other Ambulatory Visit: Payer: Self-pay

## 2020-01-22 ENCOUNTER — Other Ambulatory Visit (INDEPENDENT_AMBULATORY_CARE_PROVIDER_SITE_OTHER): Payer: Managed Care, Other (non HMO)

## 2020-01-22 DIAGNOSIS — I10 Essential (primary) hypertension: Secondary | ICD-10-CM

## 2020-01-22 DIAGNOSIS — E559 Vitamin D deficiency, unspecified: Secondary | ICD-10-CM | POA: Diagnosis not present

## 2020-01-22 DIAGNOSIS — D708 Other neutropenia: Secondary | ICD-10-CM

## 2020-01-22 LAB — CBC WITH DIFFERENTIAL/PLATELET
Basophils Absolute: 0 10*3/uL (ref 0.0–0.1)
Basophils Relative: 0.6 % (ref 0.0–3.0)
Eosinophils Absolute: 0.1 10*3/uL (ref 0.0–0.7)
Eosinophils Relative: 0.8 % (ref 0.0–5.0)
HCT: 32.9 % — ABNORMAL LOW (ref 36.0–46.0)
Hemoglobin: 11.3 g/dL — ABNORMAL LOW (ref 12.0–15.0)
Lymphocytes Relative: 9.6 % — ABNORMAL LOW (ref 12.0–46.0)
Lymphs Abs: 0.8 10*3/uL (ref 0.7–4.0)
MCHC: 34.3 g/dL (ref 30.0–36.0)
MCV: 97.2 fl (ref 78.0–100.0)
Monocytes Absolute: 1 10*3/uL (ref 0.1–1.0)
Monocytes Relative: 12.2 % — ABNORMAL HIGH (ref 3.0–12.0)
Neutro Abs: 6.5 10*3/uL (ref 1.4–7.7)
Neutrophils Relative %: 76.8 % (ref 43.0–77.0)
Platelets: 336 10*3/uL (ref 150.0–400.0)
RBC: 3.39 Mil/uL — ABNORMAL LOW (ref 3.87–5.11)
RDW: 13.2 % (ref 11.5–15.5)
WBC: 8.4 10*3/uL (ref 4.0–10.5)

## 2020-01-22 LAB — COMPREHENSIVE METABOLIC PANEL
ALT: 17 U/L (ref 0–35)
AST: 20 U/L (ref 0–37)
Albumin: 4.6 g/dL (ref 3.5–5.2)
Alkaline Phosphatase: 51 U/L (ref 39–117)
BUN: 19 mg/dL (ref 6–23)
CO2: 26 mEq/L (ref 19–32)
Calcium: 9.1 mg/dL (ref 8.4–10.5)
Chloride: 93 mEq/L — ABNORMAL LOW (ref 96–112)
Creatinine, Ser: 0.9 mg/dL (ref 0.40–1.20)
GFR: 67.78 mL/min (ref >60.00)
Glucose, Bld: 71 mg/dL (ref 70–99)
Potassium: 4.3 mEq/L (ref 3.5–5.1)
Sodium: 129 mEq/L — ABNORMAL LOW (ref 135–145)
Total Bilirubin: 0.5 mg/dL (ref 0.2–1.2)
Total Protein: 6.9 g/dL (ref 6.0–8.3)

## 2020-01-22 LAB — MICROALBUMIN / CREATININE URINE RATIO
Creatinine,U: 149.7 mg/dL
Microalb Creat Ratio: 0.8 mg/g (ref 0.0–30.0)
Microalb, Ur: 1.1 mg/dL (ref 0.0–1.9)

## 2020-01-22 LAB — TSH: TSH: 0.57 u[IU]/mL (ref 0.35–4.50)

## 2020-01-22 LAB — LDL CHOLESTEROL, DIRECT: Direct LDL: 81 mg/dL

## 2020-01-22 LAB — LIPID PANEL
Cholesterol: 227 mg/dL — ABNORMAL HIGH (ref 0–200)
HDL: 54.1 mg/dL (ref >39.00)
Total CHOL/HDL Ratio: 4
Triglycerides: 570 mg/dL — ABNORMAL HIGH (ref 0.0–149.0)

## 2020-01-22 LAB — VITAMIN D 25 HYDROXY (VIT D DEFICIENCY, FRACTURES): VITD: 32.11 ng/mL (ref 30.00–100.00)

## 2020-01-23 ENCOUNTER — Ambulatory Visit: Payer: Managed Care, Other (non HMO) | Admitting: Family Medicine

## 2020-01-23 ENCOUNTER — Telehealth: Payer: Self-pay | Admitting: Internal Medicine

## 2020-01-23 NOTE — Telephone Encounter (Signed)
Spoke with pt and she stated that she was tested for both covid and flu yesterday at 11:50am. Pt did test positive for covid-19. Pt was in our office yesterday having lab work done at 8:45am. I asked pt when her symptoms started and she stated that she been feeling really tired and more sore since Sunday. So I asked pt if she was asked the screening questions when she came in the office and she stated that she was and then stated that "symptoms didn't really start till after I left from there". Pt stated that now she has a sore throat, temp of 101, congestion, HA, and body aches. Pt has been referred to the antibody infusion clinic.

## 2020-01-23 NOTE — Telephone Encounter (Signed)
Patient wanted to let Dr. Derrel Nip know she just tested positive for covid and what should she do?

## 2020-01-24 ENCOUNTER — Other Ambulatory Visit (HOSPITAL_COMMUNITY): Payer: Self-pay | Admitting: Nurse Practitioner

## 2020-01-24 ENCOUNTER — Encounter: Payer: Self-pay | Admitting: Nurse Practitioner

## 2020-01-24 DIAGNOSIS — U071 COVID-19: Secondary | ICD-10-CM

## 2020-01-24 NOTE — Progress Notes (Signed)
I connected by phone with Adrienne Robinson on 01/24/2020 at 2:32 PM to discuss the potential use of a treatment for mild to moderate COVID-19 viral infection in non-hospitalized patients.  This patient is a 64 y.o. female that meets the FDA criteria for Emergency Use Authorization of bamlanivimab/etesevimab, casirivimab\imdevimab, or sotrovimab  Has a (+) direct SARS-CoV-2 viral test result  Has mild or moderate COVID-19   Is ? 64 years of age and weighs ? 40 kg  Is NOT hospitalized due to COVID-19  Is NOT requiring oxygen therapy or requiring an increase in baseline oxygen flow rate due to COVID-19  Is within 10 days of symptom onset  Has at least one of the high risk factor(s) for progression to severe COVID-19 and/or hospitalization as defined in EUA.  Specific high risk criteria : Chronic Kidney Disease (CKD) and Cardiovascular disease or hypertension   I have spoken and communicated the following to the patient or parent/caregiver:  1. FDA has authorized the emergency use of bamlanivimab/etesevimab, casirivimab\imdevimab, or sotrovimab for the treatment of mild to moderate COVID-19 in adults and pediatric patients with positive results of direct SARS-CoV-2 viral testing who are 4 years of age and older weighing at least 40 kg, and who are at high risk for progressing to severe COVID-19 and/or hospitalization.  2. The significant known and potential risks and benefits of bamlanivimab/etesevimab, casirivimab\imdevimab, or sotrovimab, and the extent to which such potential risks and benefits are unknown.  3. Information on available alternative treatments and the risks and benefits of those alternatives, including clinical trials.  4. Patients treated with bamlanivimab/etesevimab, casirivimab\imdevimab, or sotrovimab should continue to self-isolate and use infection control measures (e.g., wear mask, isolate, social distance, avoid sharing personal items, clean and disinfect "high  touch" surfaces, and frequent handwashing) according to CDC guidelines.   5. The patient or parent/caregiver has the option to accept or refuse bamlanivimab/etesevimab, casirivimab\imdevimab, or sotrovimab.  After reviewing this information with the patient, the patient has agreed to receive one of the available covid 19 monoclonal antibodies and will be provided an appropriate fact sheet prior to infusion.Adrienne Robinson, [place], AGNP-C [phone] ([place])

## 2020-01-26 ENCOUNTER — Telehealth: Payer: Self-pay

## 2020-01-26 NOTE — Telephone Encounter (Signed)
Sent mychart message in regards to lab results.

## 2020-01-26 NOTE — Progress Notes (Signed)
Your thyroid function is normal  on your current levothyroxine  daily  dose.  Your vitamin D is borderline low.  If you are not taking a D3 supplement,  please start taking 1000 [place] daily.  If you are ,  Please increase to 2000 IUs daily.  Your LDL is at goal but your triglycerides are over 500!  This is unusual for you based on last year's level of 150 .  Your hemoglobin has also dropped and you are mildly anemic.  Please schedule a telephone visit to discuss  these last 2  issues at your convenience.  Regards,   Deborra Medina, MD

## 2020-01-27 ENCOUNTER — Ambulatory Visit: Payer: Managed Care, Other (non HMO) | Admitting: Dermatology

## 2020-01-27 ENCOUNTER — Ambulatory Visit (HOSPITAL_COMMUNITY)
Admission: RE | Admit: 2020-01-27 | Discharge: 2020-01-27 | Disposition: A | Discharge status: Home / Self Care | Payer: Managed Care, Other (non HMO) | Source: Ambulatory Visit | Attending: Pulmonary Disease | Admitting: Pulmonary Disease

## 2020-01-27 DIAGNOSIS — U071 COVID-19: Secondary | ICD-10-CM | POA: Insufficient documentation

## 2020-01-27 MED ORDER — ALBUTEROL SULFATE HFA 108 (90 BASE) MCG/ACT IN AERS: 2.0000 | INHALATION_SPRAY | Freq: Once | RESPIRATORY_TRACT | Status: DC | PRN

## 2020-01-27 MED ORDER — FAMOTIDINE IN NACL 20-0.9 MG/50ML-% IV SOLN: 20.0000 mg | Freq: Once | INTRAVENOUS | Status: DC | PRN

## 2020-01-27 MED ORDER — SODIUM CHLORIDE 0.9 % IV SOLN: INTRAVENOUS | Status: DC | PRN

## 2020-01-27 MED ORDER — SODIUM CHLORIDE 0.9 % IV SOLN: Freq: Once | INTRAVENOUS | Status: AC

## 2020-01-27 MED ORDER — METHYLPREDNISOLONE SODIUM SUCC 125 MG IJ SOLR: 125.0000 mg | Freq: Once | INTRAMUSCULAR | Status: DC | PRN

## 2020-01-27 MED ORDER — EPINEPHRINE 0.3 MG/0.3ML IJ SOAJ: 0.3000 mg | Freq: Once | INTRAMUSCULAR | Status: DC | PRN

## 2020-01-27 MED ORDER — DIPHENHYDRAMINE HCL 50 MG/ML IJ SOLN: 50.0000 mg | Freq: Once | INTRAMUSCULAR | Status: DC | PRN

## 2020-01-27 NOTE — Progress Notes (Signed)
  Diagnosis: COVID-19  Physician: Dr. Wright   Procedure: [facility] Med: casirivimab\imdevimab infusion - Provided patient with casirivimab\imdevimab fact sheet for patients, parents and caregivers prior to infusion.  Complications: No immediate complications noted.  Discharge: Discharged home   Adrienne Summer  [name] 01/27/2020   

## 2020-01-27 NOTE — Discharge Instructions (Signed)
10 Things You Can Do to Manage Your COVID-19 Symptoms at Home If you have possible or confirmed COVID-19: 1. Stay home from work and school. And stay away from other public places. If you must go out, avoid using any kind of public transportation, ridesharing, or taxis. 2. Monitor your symptoms carefully. If your symptoms get worse, call your healthcare provider immediately. 3. Get rest and stay hydrated. 4. If you have a medical appointment, call the healthcare provider ahead of time and tell them that you have or may have COVID-19. 5. For medical emergencies, call 911 and notify the dispatch personnel that you have or may have COVID-19. 6. Cover your cough and sneezes with a tissue or use the inside of your elbow. 7. Wash your hands often with soap and water for at least 20 seconds or clean your hands with an alcohol-based hand sanitizer that contains at least 60% alcohol. 8. As much as possible, stay in a specific room and away from other people in your home. Also, you should use a separate bathroom, if available. If you need to be around other people in or outside of the home, wear a mask. 9. Avoid sharing personal items with other people in your household, like dishes, towels, and bedding. 10. Clean all surfaces that are touched often, like counters, tabletops, and doorknobs. Use household cleaning sprays or wipes according to the label instructions. cdc.gov/coronavirus 08/21/2018 This information is not intended to replace advice given to you by your health care provider. Make sure you discuss any questions you have with your health care provider. Document Revised: 01/23/2019 Document Reviewed: 01/23/2019 Elsevier Patient Education  2020 Elsevier Inc. What types of side effects do monoclonal antibody drugs cause?  Common side effects  In general, the more common side effects caused by monoclonal antibody drugs include: . Allergic reactions, such as hives or itching . Flu-like signs and  symptoms, including chills, fatigue, fever, and muscle aches and pains . Nausea, vomiting . Diarrhea . Skin rashes . Low blood pressure   The CDC is recommending patients who receive monoclonal antibody treatments wait at least 90 days before being vaccinated.  Currently, there are no data on the safety and efficacy of mRNA COVID-19 vaccines in persons who received monoclonal antibodies or convalescent plasma as part of COVID-19 treatment. Based on the estimated half-life of such therapies as well as evidence suggesting that reinfection is uncommon in the 90 days after initial infection, vaccination should be deferred for at least 90 days, as a precautionary measure until additional information becomes available, to avoid interference of the antibody treatment with vaccine-induced immune responses. If you have any questions or concerns after the infusion please call the Advanced Practice Provider on call at [phone]. This number is ONLY intended for your use regarding questions or concerns about the infusion post-treatment side-effects.  Please do not provide this number to others for use. For return to work notes please contact your primary care provider.   If someone you know is interested in receiving treatment please have them call the COVID hotline at [phone].   

## 2020-01-27 NOTE — Progress Notes (Signed)
Patient reviewed Fact Sheet for Patients, Parents, and Caregivers for Emergency Use Authorization (EUA) of Casi/Imdevimab for the Treatment of Coronavirus. Patient also reviewed and is agreeable to the estimated cost of treatment. Patient is agreeable to proceed.

## 2020-01-29 ENCOUNTER — Other Ambulatory Visit: Payer: Self-pay

## 2020-01-29 MED ORDER — LINACLOTIDE 145 MCG PO CAPS: 145.0000 ug | ORAL_CAPSULE | Freq: Every day | ORAL | 3 refills | Status: DC

## 2020-01-29 MED ORDER — DICYCLOMINE HCL 20 MG PO TABS: 20.0000 mg | ORAL_TABLET | Freq: Three times a day (TID) | ORAL | 3 refills | Status: DC

## 2020-01-29 NOTE — Telephone Encounter (Signed)
On review of records I cannot see previous use of Linzess to reduce the Amitiza 24 mcg twice daily with used in the past.  She very likely may have tried Linzess as well but I cannot find the dose on my review this morning. Gut pain medication was likely dicyclomine.  Okay to try Linzess 145 mcg daily, best 30 minutes before first meal Okay cyclomean 20 mg 3 times daily as needed crampy abdominal pain related to IBS  Linzess dose may need to be titrated if not effective or causes diarrhea.  She should let me if this is the case

## 2020-02-02 ENCOUNTER — Other Ambulatory Visit: Payer: Self-pay

## 2020-02-02 MED ORDER — OMEPRAZOLE 40 MG PO CPDR: 40.0000 mg | DELAYED_RELEASE_CAPSULE | Freq: Every day | ORAL | 3 refills | Status: DC

## 2020-02-02 NOTE — Telephone Encounter (Signed)
Can substitute omeprazole for esomeprazole at same dose

## 2020-02-09 ENCOUNTER — Telehealth (INDEPENDENT_AMBULATORY_CARE_PROVIDER_SITE_OTHER): Payer: Managed Care, Other (non HMO) | Admitting: Internal Medicine

## 2020-02-09 ENCOUNTER — Encounter: Payer: Self-pay | Admitting: Internal Medicine

## 2020-02-09 DIAGNOSIS — M8949 Other hypertrophic osteoarthropathy, multiple sites: Secondary | ICD-10-CM

## 2020-02-09 DIAGNOSIS — R053 Chronic cough: Secondary | ICD-10-CM

## 2020-02-09 DIAGNOSIS — D649 Anemia, unspecified: Secondary | ICD-10-CM

## 2020-02-09 DIAGNOSIS — E781 Pure hyperglyceridemia: Secondary | ICD-10-CM | POA: Diagnosis not present

## 2020-02-09 DIAGNOSIS — M159 Polyosteoarthritis, unspecified: Secondary | ICD-10-CM

## 2020-02-09 DIAGNOSIS — U099 Post covid-19 condition, unspecified: Secondary | ICD-10-CM

## 2020-02-09 MED ORDER — CHERATUSSIN AC 100-10 MG/5ML PO SOLN: 5.0000 mL | Freq: Three times a day (TID) | ORAL | 0 refills | Status: DC | PRN

## 2020-02-09 NOTE — Progress Notes (Signed)
Virtual Visit via [place]   This visit type was conducted due to national recommendations for restrictions regarding the COVID-19 pandemic (e.g. social distancing).  This format is felt to be most appropriate for this patient at this time.  All issues noted in this document were discussed and addressed.  No physical exam was performed (except for noted visual exam findings with Video Visits).   I connected with@ on 02/09/20 at  4:30 PM EST by a video enabled telemedicine application  and verified that I am speaking with the correct person using two identifiers. Location patient: home Location provider: work or home office Persons participating in the virtual visit: patient, provider  I discussed the limitations, risks, security and privacy concerns of performing an evaluation and management service by telephone and the availability of in person appointments. I also discussed with the patient that there may be a patient responsible charge related to this service. The patient expressed understanding and agreed to proceed.  Reason for visit: 1 ) follow up on COVID 19 infection 2) abnormal labs   HPI:  64 yr old female with history of chronic neck and back pain , hypertension,  CKD stage 3,    1) She is recovering from COVID 19 INFECTION that was diagnosed on Dec 3 and treated with infusion .  Symptoms were moderate for 5 days ,  Felt like a very bad flu.  Cough started after the first week.  On productive,   Ears feel full.  Lots of PND>  Using Nettie Pott bid and Robitussin DM   1) elevated triglycerides  Confirmed fasting state.  Had been on prednisone (3 successive rounds, last one prescribed on Dec 1) prior to test. Denies excessive alcohol use.   2) anemia : asymptomatic hgb drop from 11.9 to 11.3 .  Takes b12 injections weekly.  Chronic abdominal bloating and IBS-C under treatment by GI.  Tubular adenoma by 2018 colonoscopy, and FH of colon CA   ROS: See pertinent positives and  negatives per HPI.  Past Medical History:  Diagnosis Date  . Allergy   . Arthritis   . 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 B12 deficiency 01/2016  . Candida esophagitis ([place])   . Degenerative joint disease involving multiple joints   . Depression   . Dizziness   . Hypertension   . Pneumonia    approx 6 years ago  . Scoliosis   . Spinal stenosis of lumbar region   . Squamous cell carcinoma of skin 06/24/2018   R lateral calf  . Tubular adenoma of colon     Past Surgical History:  Procedure Laterality Date  . ABDOMINAL HYSTERECTOMY    . APPENDECTOMY    . CARDIAC CATHETERIZATION     normal coronaries, no wall motion abnormalities 11/02/09 [facility])  . NASAL SINUS SURGERY    . OOPHORECTOMY    . RADIOLOGY WITH ANESTHESIA  02/26/2012   Procedure: RADIOLOGY WITH ANESTHESIA;  Surgeon: [name], MD;  Location: [place];  Service: Radiology;  Laterality: N/A;  . REVERSE SHOULDER ARTHROPLASTY Right 06/27/2013   Procedure: REVERSE RIGHT TOTAL SHOULDER ARTHROPLASTY;  Surgeon: [name], MD;  Location: [place];  Service: Orthopedics;  Laterality: Right;  . SHOULDER ARTHROSCOPY WITH ROTATOR CUFF REPAIR AND SUBACROMIAL DECOMPRESSION  2007   left  . TONSILLECTOMY  adnoids    Family History  Problem Relation Age of Onset  . Colon cancer Father 49  .  Diabetes Father   . Heart disease Father   . Rectal cancer Father   . Thyroid disease Mother   . Colon cancer Paternal Grandfather   . Colon cancer Other        pggm  . Breast cancer Paternal Grandmother   . Stomach cancer Neg Hx     SOCIAL HX:  reports that she has never smoked. She has never used smokeless tobacco. She reports current alcohol use of about 2.0 standard drinks of alcohol per week. She reports that she does not use drugs.   Current Outpatient Medications:  .  amLODipine (NORVASC) 10 MG tablet, TAKE 1 TABLET BY MOUTH EVERY DAY, Disp: 90 tablet, Rfl: 1 .   buPROPion (WELLBUTRIN XL) 150 MG 24 hr tablet, TAKE 3 TABLETS (450 MG TOTAL) BY MOUTH DAILY. (Patient taking differently: Take 150 mg by mouth daily.), Disp: 270 tablet, Rfl: 4 .  Cholecalciferol (VITAMIN D3) 50 MCG (2000 UT) CAPS, Take 1 capsule by mouth daily., Disp: , Rfl:  .  cyanocobalamin (,VITAMIN B-12,) 1000 MCG/ML injection, INJECT 1 ML (1,000 MCG TOTAL) INTO THE MUSCLE ONCE A WEEK., Disp: 12 mL, Rfl: 6 .  dicyclomine (BENTYL) 20 MG tablet, Take 1 tablet (20 mg total) by mouth 3 (three) times daily before meals., Disp: 60 tablet, Rfl: 3 .  fluticasone (FLONASE) 50 MCG/ACT nasal spray, USE 2 SPRAYS IN EACH NOSTRIL DAILY AS NEEDED, Disp: 16 g, Rfl: 1 .  HYDROcodone-acetaminophen (NORCO) 10-325 MG tablet, Take 1 tablet by mouth every 6 (six) hours as needed for severe pain., Disp: 120 tablet, Rfl: 0 .  [START ON 03/08/2020] HYDROcodone-acetaminophen (NORCO) 10-325 MG tablet, Take 1 tablet by mouth every 6 (six) hours as needed for severe pain., Disp: 120 tablet, Rfl: 0 .  [START ON 04/07/2020] HYDROcodone-acetaminophen (NORCO) 10-325 MG tablet, Take 1 tablet by mouth every 6 (six) hours as needed for severe pain., Disp: 120 tablet, Rfl: 0 .  linaclotide (LINZESS) 145 MCG CAPS capsule, Take 1 capsule (145 mcg total) by mouth daily before breakfast., Disp: 30 capsule, Rfl: 3 .  metaxalone (SKELAXIN) 800 MG tablet, Take 1 tablet (800 mg total) by mouth 3 (three) times daily., Disp: 30 tablet, Rfl: 2 .  metoprolol succinate (TOPROL-XL) 25 MG 24 hr tablet, TAKE 1 TABLET BY MOUTH EVERY DAY, Disp: 90 tablet, Rfl: 1 .  Multiple Vitamin (MULTIVITAMIN) tablet, Take 1 tablet by mouth daily., Disp: , Rfl:  .  omeprazole (PRILOSEC) 40 MG capsule, Take 1 capsule (40 mg total) by mouth daily., Disp: 90 capsule, Rfl: 3 .  Syringe/Needle, Disp, (SYRINGE 3CC/25GX1") 25G X 1" 3 ML MISC, Use for b12 injections, Disp: 50 each, Rfl: 0 .  vitamin E 1000 UNIT capsule, Take 1,000 Units by mouth daily., Disp: , Rfl:  .   zolpidem (AMBIEN) 10 MG tablet, TAKE 1 TABLET (10 MG TOTAL) BY MOUTH AT BEDTIME AS NEEDED. FOR SLEEP, Disp: 30 tablet, Rfl: 2 .  guaiFENesin-codeine (CHERATUSSIN AC) 100-10 MG/5ML syrup, Take 5 mLs by mouth 3 (three) times daily as needed for cough., Disp: 180 mL, Rfl: 0 .  rifaximin (XIFAXAN) 550 MG TABS tablet, Take 1 tablet (550 mg total) by mouth 3 (three) times daily. (Patient not taking: Reported on 02/09/2020), Disp: 42 tablet, Rfl: 0  EXAM:  VITALS per patient if applicable:  GENERAL: alert, oriented, appears well and in no acute distress  HEENT: atraumatic, conjunttiva clear, no obvious abnormalities on inspection of external nose and ears  NECK: normal movements of the  head and neck  LUNGS: on inspection no signs of respiratory distress, breathing rate appears normal, no obvious gross SOB, gasping or wheezing  CV: no obvious cyanosis  MS: moves all visible extremities without noticeable abnormality  PSYCH/NEURO: pleasant and cooperative, no obvious depression or anxiety, speech and thought processing grossly intact  ASSESSMENT AND PLAN:  Discussed the following assessment and plan:  Anemia, unspecified type - Plan: B12 and Folate Panel, Iron, TIBC and Ferritin Panel, Fecal occult blood, imunochemical  Hypertriglyceridemia  Post-COVID chronic cough  Primary osteoarthritis involving multiple joints  Anemia Normocytic, Etiology unclear. She is taking weekly b12 injections,  Will check iron,  b12 and folate stores.   Lab Results  Component Value Date   WBC 8.4 01/22/2020   HGB 11.3 (L) 01/22/2020   HCT 32.9 (L) 01/22/2020   MCV 97.2 01/22/2020   PLT 336.0 01/22/2020     Hypertriglyceridemia Etiology may be due to recent use  Of prednisone vs occult alcohol abuse.  Repeat in 3 months   Post-COVID chronic cough Nonproductive,  Without dyspnea. Continue cough suppression,  Chest x ray in 4 weeks if persistent .  Degenerative joint disease involving multiple  joints She has chronic back pain Complicated by scoliosis. She has persistent shoulder pain despite prior reconstruction.  celebrex has been stopped due to CKD and prednisone taper used in the recent past to  manage recent increase in neck pain.   .She has not had any ER visits. Her Refill history was confirmed via [place] Controlled Substance database by me today during her visit and there have been no prescriptions of controlled substances filled from any providers other than me. .Will refill for Dec, Jan and February     I discussed the assessment and treatment plan with the patient. The patient was provided an opportunity to ask questions and all were answered. The patient agreed with the plan and demonstrated an understanding of the instructions.   The patient was advised to call back or seek an in-person evaluation if the symptoms worsen or if the condition fails to improve as anticipated.  I provided 30 minutes of face-to-face time during this encounter.   [name], MD

## 2020-02-12 DIAGNOSIS — D649 Anemia, unspecified: Secondary | ICD-10-CM | POA: Insufficient documentation

## 2020-02-12 DIAGNOSIS — R053 Chronic cough: Secondary | ICD-10-CM

## 2020-02-12 DIAGNOSIS — U099 Post covid-19 condition, unspecified: Secondary | ICD-10-CM

## 2020-02-12 DIAGNOSIS — E781 Pure hyperglyceridemia: Secondary | ICD-10-CM | POA: Insufficient documentation

## 2020-02-12 HISTORY — DX: Post covid-19 condition, unspecified: U09.9

## 2020-02-12 HISTORY — DX: Chronic cough: R05.3

## 2020-02-12 NOTE — Assessment & Plan Note (Signed)
She has chronic back pain Complicated by scoliosis. She has persistent shoulder pain despite prior reconstruction.  celebrex has been stopped due to CKD and prednisone taper used in the recent past to  manage recent increase in neck pain.   .She has not had any ER visits. Her Refill history was confirmed via [place] Controlled Substance database by me today during her visit and there have been no prescriptions of controlled substances filled from any providers other than me. .Will refill for Dec, Jan and February

## 2020-02-12 NOTE — Assessment & Plan Note (Signed)
Etiology may be due to recent use  Of prednisone vs occult alcohol abuse.  Repeat in 3 months

## 2020-02-12 NOTE — Assessment & Plan Note (Signed)
Nonproductive,  Without dyspnea. Continue cough suppression,  Chest x ray in 4 weeks if persistent .

## 2020-02-12 NOTE — Assessment & Plan Note (Signed)
Normocytic, Etiology unclear. She is taking weekly b12 injections,  Will check iron,  b12 and folate stores.   Lab Results  Component Value Date   WBC 8.4 01/22/2020   HGB 11.3 (L) 01/22/2020   HCT 32.9 (L) 01/22/2020   MCV 97.2 01/22/2020   PLT 336.0 01/22/2020

## 2020-02-29 ENCOUNTER — Emergency Department (HOSPITAL_BASED_OUTPATIENT_CLINIC_OR_DEPARTMENT_OTHER)
Admission: EM | Admit: 2020-02-29 | Discharge: 2020-02-29 | Disposition: A | Discharge status: Home / Self Care | Payer: Managed Care, Other (non HMO) | Attending: Emergency Medicine | Admitting: Emergency Medicine

## 2020-02-29 ENCOUNTER — Encounter (HOSPITAL_BASED_OUTPATIENT_CLINIC_OR_DEPARTMENT_OTHER): Payer: Self-pay | Admitting: Emergency Medicine

## 2020-02-29 ENCOUNTER — Other Ambulatory Visit: Payer: Self-pay

## 2020-02-29 ENCOUNTER — Emergency Department (HOSPITAL_BASED_OUTPATIENT_CLINIC_OR_DEPARTMENT_OTHER): Payer: Managed Care, Other (non HMO)

## 2020-02-29 DIAGNOSIS — Z79899 Other long term (current) drug therapy: Secondary | ICD-10-CM | POA: Insufficient documentation

## 2020-02-29 DIAGNOSIS — W01198A Fall on same level from slipping, tripping and stumbling with subsequent striking against other object, initial encounter: Secondary | ICD-10-CM | POA: Insufficient documentation

## 2020-02-29 DIAGNOSIS — M25561 Pain in right knee: Secondary | ICD-10-CM | POA: Insufficient documentation

## 2020-02-29 DIAGNOSIS — Z23 Encounter for immunization: Secondary | ICD-10-CM | POA: Insufficient documentation

## 2020-02-29 DIAGNOSIS — I129 Hypertensive chronic kidney disease with stage 1 through stage 4 chronic kidney disease, or unspecified chronic kidney disease: Secondary | ICD-10-CM | POA: Diagnosis not present

## 2020-02-29 DIAGNOSIS — Z7984 Long term (current) use of oral hypoglycemic drugs: Secondary | ICD-10-CM | POA: Diagnosis not present

## 2020-02-29 DIAGNOSIS — S0181XA Laceration without foreign body of other part of head, initial encounter: Secondary | ICD-10-CM | POA: Insufficient documentation

## 2020-02-29 DIAGNOSIS — N183 Chronic kidney disease, stage 3 unspecified: Secondary | ICD-10-CM | POA: Diagnosis not present

## 2020-02-29 DIAGNOSIS — S0990XA Unspecified injury of head, initial encounter: Secondary | ICD-10-CM | POA: Diagnosis present

## 2020-02-29 MED ORDER — LIDOCAINE HCL (PF) 1 % IJ SOLN: 5.0000 mL | Freq: Once | INTRAMUSCULAR | Status: DC

## 2020-02-29 MED ORDER — OXYCODONE-ACETAMINOPHEN 5-325 MG PO TABS
1.0000 | ORAL_TABLET | Freq: Once | ORAL | Status: AC
  Administered 2020-02-29: 1 via ORAL
  Filled 2020-02-29: qty 1

## 2020-02-29 MED ORDER — LIDOCAINE-EPINEPHRINE 1 %-1:100000 IJ SOLN
INTRAMUSCULAR | Status: AC
  Administered 2020-02-29: 20 mL
  Filled 2020-02-29: qty 1

## 2020-02-29 MED ORDER — LIDOCAINE HCL (PF) 1 % IJ SOLN
INTRAMUSCULAR | Status: AC
  Filled 2020-02-29: qty 5

## 2020-02-29 MED ORDER — TETANUS-DIPHTH-ACELL PERTUSSIS 5-2.5-18.5 LF-MCG/0.5 IM SUSY
0.5000 mL | PREFILLED_SYRINGE | Freq: Once | INTRAMUSCULAR | Status: AC
  Administered 2020-02-29: 0.5 mL via INTRAMUSCULAR
  Filled 2020-02-29: qty 0.5

## 2020-02-29 MED ORDER — LIDOCAINE-EPINEPHRINE 1 %-1:100000 IJ SOLN: 20.0000 mL | Freq: Once | INTRAMUSCULAR | Status: AC

## 2020-02-29 NOTE — ED Notes (Signed)
ED Provider at bedside. 

## 2020-02-29 NOTE — Discharge Instructions (Addendum)
You are seen here for a facial lack.  I have given you 4 stitches.  Please abstain from getting the wound wet for the first 24 hours.  After that I would like you to apply cool water to the area and change out the dressings twice a day for the next 5 days.  You may take over-the-counter pain medications like ibuprofen or Tylenol every 6 hours as needed please follow dosage in the back of bottle.  You will have to follow-up with your primary care provider, urgent care, ED in 5 days to have sutures removed.  Come back to the emergency department if you develop chest pain, shortness of breath, severe abdominal pain, uncontrolled nausea, vomiting, diarrhea.

## 2020-02-29 NOTE — ED Triage Notes (Signed)
Reports she was playing with her dog when she tripped over her husbands feet causing her to hit the door frame head first.  Lac noted to chin.  Also has hematoma to right knee.

## 2020-02-29 NOTE — ED Provider Notes (Signed)
[facility] EMERGENCY DEPARTMENT Provider Note   CSN: [id number] Arrival date & time: 02/29/20  2027     History Chief Complaint  Patient presents with  . Facial Laceration    Adrienne Robinson is a 65 y.o. female.  HPI   Patient with no significant medical history presents to the emergency department with chief complaint of fall.  Patient endorses that she was playing with her dog and she tripped causing her to fall into a door jam headfirst.  She denies losing consciousness, is not on anticoags.  She states that she sliced the bottom of her chin open but is able to control the bleeding with pressure.  She denies headaches, change in vision, paresthesia weakness upper lower extremities.  She also endorses that she hurt her right knee, but is able to bear weight on it, denies paresthesia or weakness in the lower extremity.  Patient denies alleviating factors.  Patient denies headaches, fevers, chills, shortness of breath, chest pain, abdominal pain, nausea, vomiting, diarrhea, pedal edema.  Past Medical History:  Diagnosis Date  . Allergy   . Arthritis   . 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 B12 deficiency 01/2016  . Candida esophagitis ([place])   . Degenerative joint disease involving multiple joints   . Depression   . Dizziness   . Hypertension   . Pneumonia    approx 6 years ago  . Scoliosis   . Spinal stenosis of lumbar region   . Squamous cell carcinoma of skin 06/24/2018   R lateral calf  . Tubular adenoma of colon     Patient Active Problem List   Diagnosis Date Noted  . Anemia 02/12/2020  . Hypertriglyceridemia 02/12/2020  . 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 Prepatellar effusion of right knee 06/09/2019  . Trigger point of shoulder region, left 10/29/2018  . Abdominal bloating 08/22/2018  . Chronic kidney disease, stage 3, mod decreased GFR (HCC) 08/22/2018  . Abnormal TSH 05/29/2018  .  Arthritis of carpometacarpal [facility]) joint of left thumb 02/21/2017  . Anxiety associated with depression 01/24/2017  . IBS (irritable bowel syndrome) 07/22/2016  . Nonallopathic lesion of lumbosacral region 03/22/2016  . Nonallopathic lesion of sacral region 03/22/2016  . Nonallopathic lesion of thoracic region 03/22/2016  . B12 deficiency 03/19/2016  . Major depressive disorder, single episode, moderate ([place]) 03/19/2016  . History of gout 01/13/2016  . Chronic pain syndrome 11/11/2015  . Constipation due to opioid therapy 05/08/2015  . Cervical spondylosis without myelopathy 05/08/2015  . History of oral aphthous ulcers 05/06/2015  . Encounter for preventive health examination 08/08/2014  . Fibromyalgia 01/03/2014  . Osteoarthritis of shoulder 06/27/2013  . Inflammatory polyarthropathy ([place]) 04/13/2013  . S/P abdominal hysterectomy 03/04/2013  . Chronic reflux esophagitis 10/29/2012  . Other neutropenia ([place]) 03/05/2012  . AVM (arteriovenous malformation) brain 01/31/2012  . Need for influenza vaccination 01/28/2012  . Family history of colon cancer 10/04/2011  . Hypertension   . Degenerative joint disease involving multiple joints   . Spinal stenosis of lumbar region     Past Surgical History:  Procedure Laterality Date  . ABDOMINAL HYSTERECTOMY    . APPENDECTOMY    . CARDIAC CATHETERIZATION     normal coronaries, no wall motion abnormalities 11/02/09 [facility])  . NASAL SINUS SURGERY    . OOPHORECTOMY    . RADIOLOGY WITH ANESTHESIA  02/26/2012   Procedure: RADIOLOGY WITH ANESTHESIA;  Surgeon: [name],  MD;  Location: [facility];  Service: Radiology;  Laterality: N/A;  . REVERSE SHOULDER ARTHROPLASTY Right 06/27/2013   Procedure: REVERSE RIGHT TOTAL SHOULDER ARTHROPLASTY;  Surgeon: [name], MD;  Location: [facility];  Service: Orthopedics;  Laterality: Right;  . SHOULDER ARTHROSCOPY WITH ROTATOR CUFF REPAIR AND SUBACROMIAL DECOMPRESSION  2007   left  . TONSILLECTOMY   adnoids     OB History   No obstetric history on file.     Family History  Problem Relation Age of Onset  . Colon cancer Father 1  . Diabetes Father   . Heart disease Father   . Rectal cancer Father   . Thyroid disease Mother   . Colon cancer Paternal Grandfather   . Colon cancer Other        pggm  . Breast cancer Paternal Grandmother   . Stomach cancer Neg Hx     Social History   Tobacco Use  . Smoking status: Never Smoker  . Smokeless tobacco: Never Used  Vaping Use  . Vaping Use: Never used  Substance Use Topics  . Alcohol use: Yes    Alcohol/week: 2.0 standard drinks    Types: 2 Glasses of wine per week    Comment: 2-3 week with dinner  . Drug use: No    Home Medications Prior to Admission medications   Medication Sig Start Date End Date Taking? Authorizing Provider  amLODipine (NORVASC) 10 MG tablet TAKE 1 TABLET BY MOUTH EVERY DAY 01/19/20   [name], MD  buPROPion (WELLBUTRIN XL) 150 MG 24 hr tablet TAKE 3 TABLETS (450 MG TOTAL) BY MOUTH DAILY. Patient taking differently: Take 150 mg by mouth daily. 09/01/19   [name], MD  Cholecalciferol (VITAMIN D3) 50 MCG (2000 UT) CAPS Take 1 capsule by mouth daily.    [provider]  cyanocobalamin (,VITAMIN B-12,) 1000 MCG/ML injection INJECT 1 ML (1,000 MCG TOTAL) INTO THE MUSCLE ONCE A WEEK. 09/17/19   [name], MD  dicyclomine (BENTYL) 20 MG tablet Take 1 tablet (20 mg total) by mouth 3 (three) times daily before meals. 01/29/20   Pyrtle, Lajuan Lines, MD  fluticasone (FLONASE) 50 MCG/ACT nasal spray USE 2 SPRAYS IN EACH NOSTRIL DAILY AS NEEDED 05/29/18   [name], MD  guaiFENesin-codeine (CHERATUSSIN AC) 100-10 MG/5ML syrup Take 5 mLs by mouth 3 (three) times daily as needed for cough. 02/09/20   [name], MD  HYDROcodone-acetaminophen (NORCO) 10-325 MG tablet Take 1 tablet by mouth every 6 (six) hours as needed for severe pain. 02/07/20 03/08/20  [name], MD   HYDROcodone-acetaminophen (NORCO) 10-325 MG tablet Take 1 tablet by mouth every 6 (six) hours as needed for severe pain. 03/08/20 04/07/20  [name], MD  HYDROcodone-acetaminophen (NORCO) 10-325 MG tablet Take 1 tablet by mouth every 6 (six) hours as needed for severe pain. 04/07/20 05/07/20  [name], MD  linaclotide Rolan Lipa) 145 MCG CAPS capsule Take 1 capsule (145 mcg total) by mouth daily before breakfast. 01/29/20   Pyrtle, Lajuan Lines, MD  metaxalone (SKELAXIN) 800 MG tablet Take 1 tablet (800 mg total) by mouth 3 (three) times daily. 12/22/19   [name], MD  metoprolol succinate (TOPROL-XL) 25 MG 24 hr tablet TAKE 1 TABLET BY MOUTH EVERY DAY 01/19/20   [name], MD  Multiple Vitamin (MULTIVITAMIN) tablet Take 1 tablet by mouth daily.    [provider]  omeprazole (PRILOSEC) 40 MG capsule Take 1 capsule (  40 mg total) by mouth daily. 02/02/20   Pyrtle, Lajuan Lines, MD  rifaximin (XIFAXAN) 550 MG TABS tablet Take 1 tablet (550 mg total) by mouth 3 (three) times daily. Patient not taking: Reported on 02/09/2020 12/29/19   Jerene Bears, MD  Syringe/Needle, Disp, (SYRINGE 3CC/25GX1") 25G X 1" 3 ML MISC Use for b12 injections 08/21/18   [name], MD  vitamin E 1000 UNIT capsule Take 1,000 Units by mouth daily.    [provider]  zolpidem (AMBIEN) 10 MG tablet TAKE 1 TABLET (10 MG TOTAL) BY MOUTH AT BEDTIME AS NEEDED. FOR SLEEP 01/19/20   [name], MD  ferrous sulfate 325 (65 FE) MG EC tablet Take 1 tablet (325 mg total) by mouth daily after breakfast. 01/06/11 05/04/11  [name], MD  metFORMIN (GLUCOPHAGE) 500 MG tablet Take 500 mg by mouth 2 (two) times daily with a meal.    05/04/11  [provider]    Allergies    Adhesive [tape] and Morphine and related  Review of Systems   Review of Systems  Constitutional: Negative for chills and fever.  HENT: Negative for congestion.   Respiratory: Negative for shortness of breath.    Cardiovascular: Negative for chest pain.  Gastrointestinal: Negative for abdominal pain.  Genitourinary: Negative for enuresis.  Musculoskeletal: Negative for back pain.       Right knee pain  Skin: Positive for wound. Negative for rash.       Laceration to chin  Neurological: Negative for dizziness.  Hematological: Does not bruise/bleed easily.    Physical Exam Updated Vital Signs BP 125/78 (BP Location: Right Arm)   Pulse 60   Temp 98.1 F (36.7 C) (Oral)   Resp 20   Ht 5\' 4"  (1.626 m)   Wt 51.7 kg   SpO2 99%   BMI 19.57 kg/m   Physical Exam Vitals and nursing note reviewed.  Constitutional:      General: She is not in acute distress.    Appearance: She is not ill-appearing.  HENT:     Head: Normocephalic and atraumatic.     Nose: No congestion.     Mouth/Throat:     Mouth: Mucous membranes are moist.     Pharynx: Oropharynx is clear. No oropharyngeal exudate or posterior oropharyngeal erythema.     Comments: Oropharynx is visualized tongue and uvula are both midline, no noted trauma to patient's gumline or teeth.  No trismus present Eyes:     Extraocular Movements: Extraocular movements intact.     Conjunctiva/sclera: Conjunctivae normal.  Cardiovascular:     Rate and Rhythm: Normal rate and regular rhythm.     Pulses: Normal pulses.     Heart sounds: No murmur heard. No friction rub. No gallop.   Pulmonary:     Effort: No respiratory distress.     Breath sounds: No wheezing, rhonchi or rales.  Musculoskeletal:     Cervical back: Normal range of motion.     Comments: Patient's right knee is visualized there is slight edema noted, no erythema present.  It was tender to palpation along the patella, no deformities palpated.  She had full range of motion at the knee ankle, neurovascular fully intact.  Patient is moving all 4 extremities at difficulty.  Skin:    General: Skin is warm and dry.     Findings: Lesion present.     Comments: Patient has a noted  laceration underneath her chin measuring approximately 4 cm in length,  1 cm in depth.  No surrounding erythema present.  Hemodynamically stable  Neurological:     General: No focal deficit present.     Mental Status: She is alert.  Psychiatric:        Mood and Affect: Mood normal.     ED Results / Procedures / Treatments   Labs (all labs ordered are listed, but only abnormal results are displayed) Labs Reviewed - No data to display  EKG None  Radiology CT Head Wo Contrast  Result Date: 02/29/2020 CLINICAL DATA:  Fall EXAM: CT HEAD WITHOUT CONTRAST TECHNIQUE: Contiguous axial images were obtained from the base of the skull through the vertex without intravenous contrast. COMPARISON:  02/27/2012 FINDINGS: Brain: High-density material again noted within the medial right parietal lobe compatible with prior treatment of AVM. This has a stable appearance compared to prior study. No acute intracranial abnormality. Specifically, no hemorrhage, hydrocephalus, mass lesion, acute infarction, or significant intracranial injury. Vascular: No hyperdense vessel or unexpected calcification. Skull: No acute calvarial abnormality. Sinuses/Orbits: No acute findings Other: None IMPRESSION: No acute intracranial abnormality. Electronically Signed   By: Rolm Baptise M.D.   On: 02/29/2020 21:50   CT Cervical Spine Wo Contrast  Result Date: 02/29/2020 CLINICAL DATA:  Fall EXAM: CT CERVICAL SPINE WITHOUT CONTRAST TECHNIQUE: Multidetector CT imaging of the cervical spine was performed without intravenous contrast. Multiplanar CT image reconstructions were also generated. COMPARISON:  None. FINDINGS: Alignment: Degenerative anterolisthesis of C4 on C5 and C5 on C6 of approximately 2 mm. Skull base and vertebrae: No acute fracture. No primary bone lesion or focal pathologic process. Soft tissues and spinal canal: No prevertebral fluid or swelling. No visible canal hematoma. Disc levels:  Diffuse degenerative disc and  facet disease. Upper chest: No acute findings Other: None IMPRESSION: No acute bony abnormality. Electronically Signed   By: Rolm Baptise M.D.   On: 02/29/2020 21:53   DG Knee Complete 4 Views Right  Result Date: 02/29/2020 CLINICAL DATA:  Fall EXAM: RIGHT KNEE - COMPLETE 4+ VIEW COMPARISON:  None. FINDINGS: No evidence of fracture, dislocation, or joint effusion. No evidence of arthropathy or other focal bone abnormality. Soft tissues are unremarkable. IMPRESSION: Negative. Electronically Signed   By: Rolm Baptise M.D.   On: 02/29/2020 22:02   CT Maxillofacial WO CM  Result Date: 02/29/2020 CLINICAL DATA:  Fall EXAM: CT MAXILLOFACIAL WITHOUT CONTRAST TECHNIQUE: Multidetector CT imaging of the maxillofacial structures was performed. Multiplanar CT image reconstructions were also generated. COMPARISON:  None. FINDINGS: Osseous: No fracture or mandibular dislocation. No destructive process. Orbits: Negative. No traumatic or inflammatory finding. Sinuses: Air-fluid level within the right sphenoid sinus. Mucosal thickening in the left maxillary sinus. Soft tissues: Negative Limited intracranial: See head CT report IMPRESSION: No facial or orbital fracture. Acute on chronic sinusitis. Electronically Signed   By: Rolm Baptise M.D.   On: 02/29/2020 21:52    Procedures .Marland KitchenLaceration Repair  Date/Time: 02/29/2020 10:45 PM Performed by: Marcello Fennel, PA-C Authorized by: Marcello Fennel, PA-C   Consent:    Consent obtained:  Verbal   Consent given by:  Patient   Risks discussed:  Infection, pain, retained foreign body, need for additional repair, poor cosmetic result, tendon damage, vascular damage, poor wound healing and nerve damage   Alternatives discussed:  No treatment Universal protocol:    Patient identity confirmed:  Verbally with patient Anesthesia:    Anesthesia method:  Local infiltration   Local anesthetic:  Lidocaine 2% WITH epi Laceration details:  Location:  Face   Face  location:  Chin   Length (cm):  4   Depth (mm):  1 Pre-procedure details:    Preparation:  Patient was prepped and draped in usual sterile fashion and imaging obtained to evaluate for foreign bodies Exploration:    Wound exploration: wound explored through full range of motion and entire depth of wound visualized     Contaminated: no   Treatment:    Area cleansed with:  Saline   Amount of cleaning:  Standard   Visualized foreign bodies/material removed: no     Debridement:  None Skin repair:    Repair method:  Sutures   Suture size:  5-0   Suture material:  Prolene   Number of sutures:  4 Approximation:    Approximation:  Close Repair type:    Repair type:  Simple Post-procedure details:    Dressing:  Non-adherent dressing   Procedure completion:  Tolerated well, no immediate complications Comments:     .   (including critical care time)  Medications Ordered in ED Medications  oxyCODONE-acetaminophen (PERCOCET/ROXICET) 5-325 MG per tablet 1 tablet (1 tablet Oral Given 02/29/20 2121)  Tdap (BOOSTRIX) injection 0.5 mL (0.5 mLs Intramuscular Given 02/29/20 2137)  lidocaine-EPINEPHrine (XYLOCAINE W/EPI) 1 %-1:100000 (with pres) injection 20 mL (20 mLs Infiltration Given 02/29/20 2254)    ED Course  I have reviewed the triage vital signs and the nursing notes.  Pertinent labs & imaging results that were available during my care of the patient were reviewed by me and considered in my medical decision making (see chart for details).    MDM Rules/Calculators/A&P                          Patient presents after having mechanical fall.  She is alert, does not appear to be in acute distress, vital signs reassuring.  Due to mechanism of injury will obtain imaging of head, neck and face as well as imaging of right knee for further evaluation.  Will update patient's tetanus shot and recommend suturing.  All imaging negative at this time.  Will recommend suturing to decrease infection  risk and to assist with the healing process.  Patient was agreeable with this and tolerated the procedure well. sHe received 4 sutures.  Neurovascular was fully intact after the procedure.  Low suspicion for intracranial head bleed as there is no neuro deficits on my exam, CT head was negative.   Low suspicion for fracture or dislocation of patient's head or knee as imaging does not reveal any acute findings. low suspicion for ligament or tendon damage as knee was palpated no gross defects noted,she had full range of motion right knee.  Low suspicion for compartment syndrome as area was palpated it was soft to the touch, neurovascular fully.  Patient received 4 sutures will recommend basic wound care and have her follow-up in 5 days time for suture removal.  Vital signs have remained stable, no indication for hospital admission.  Patient discussed with attending and they agreed with assessment and plan.  Patient given at home care as well strict return precautions.  Patient verbalized that they understood agreed to said plan.  Final Clinical Impression(s) / ED Diagnoses Final diagnoses:  Facial laceration, initial encounter    Rx / DC Orders ED Discharge Orders    None       Marcello Fennel, PA-C 02/29/20 2353    Lennice Sites, DO 03/01/20 1402

## 2020-03-09 ENCOUNTER — Ambulatory Visit: Payer: Managed Care, Other (non HMO) | Admitting: Dermatology

## 2020-04-13 ENCOUNTER — Other Ambulatory Visit: Payer: Self-pay | Admitting: Internal Medicine

## 2020-04-14 NOTE — Telephone Encounter (Signed)
Has medication follow up schedule for 04/20/20 ok to fill?

## 2020-04-19 ENCOUNTER — Other Ambulatory Visit: Payer: Managed Care, Other (non HMO)

## 2020-04-20 ENCOUNTER — Other Ambulatory Visit: Payer: Self-pay

## 2020-04-20 ENCOUNTER — Telehealth: Payer: Managed Care, Other (non HMO) | Admitting: Internal Medicine

## 2020-04-20 ENCOUNTER — Other Ambulatory Visit (INDEPENDENT_AMBULATORY_CARE_PROVIDER_SITE_OTHER): Payer: Managed Care, Other (non HMO)

## 2020-04-20 DIAGNOSIS — D649 Anemia, unspecified: Secondary | ICD-10-CM | POA: Diagnosis not present

## 2020-04-20 LAB — IBC + FERRITIN
Ferritin: 37 ng/mL (ref 10.0–291.0)
Iron: 161 ug/dL — ABNORMAL HIGH (ref 42–145)
Saturation Ratios: 41.8 % (ref 20.0–50.0)
Transferrin: 275 mg/dL (ref 212.0–360.0)

## 2020-04-20 LAB — B12 AND FOLATE PANEL
Folate: >23.6 ng/mL (ref >5.9)
Vitamin B-12: >1506 pg/mL — ABNORMAL HIGH (ref 211–911)

## 2020-04-22 ENCOUNTER — Other Ambulatory Visit: Payer: Managed Care, Other (non HMO)

## 2020-04-22 DIAGNOSIS — D649 Anemia, unspecified: Secondary | ICD-10-CM

## 2020-04-22 NOTE — Addendum Note (Signed)
Addended by: Leeanne Rio on: 04/22/2020 10:51 AM   Modules accepted: Orders

## 2020-04-23 ENCOUNTER — Encounter: Payer: Self-pay | Admitting: Internal Medicine

## 2020-04-23 ENCOUNTER — Other Ambulatory Visit: Payer: Self-pay | Admitting: Internal Medicine

## 2020-04-23 ENCOUNTER — Telehealth (INDEPENDENT_AMBULATORY_CARE_PROVIDER_SITE_OTHER): Payer: Managed Care, Other (non HMO) | Admitting: Internal Medicine

## 2020-04-23 DIAGNOSIS — N1831 Chronic kidney disease, stage 3a: Secondary | ICD-10-CM

## 2020-04-23 DIAGNOSIS — D649 Anemia, unspecified: Secondary | ICD-10-CM | POA: Diagnosis not present

## 2020-04-23 DIAGNOSIS — T402X5A Adverse effect of other opioids, initial encounter: Secondary | ICD-10-CM

## 2020-04-23 DIAGNOSIS — M159 Polyosteoarthritis, unspecified: Secondary | ICD-10-CM

## 2020-04-23 DIAGNOSIS — M8949 Other hypertrophic osteoarthropathy, multiple sites: Secondary | ICD-10-CM | POA: Diagnosis not present

## 2020-04-23 DIAGNOSIS — K5903 Drug induced constipation: Secondary | ICD-10-CM

## 2020-04-23 LAB — IRON AND TIBC
Iron Saturation: 45 % (ref 15–55)
Iron: 152 ug/dL — ABNORMAL HIGH (ref 27–139)
Total Iron Binding Capacity: 339 ug/dL (ref 250–450)
UIBC: 187 ug/dL (ref 118–369)

## 2020-04-23 MED ORDER — HYDROCODONE-ACETAMINOPHEN 10-325 MG PO TABS: 1.0000 | ORAL_TABLET | Freq: Four times a day (QID) | ORAL | 0 refills | Status: DC | PRN

## 2020-04-23 NOTE — Assessment & Plan Note (Signed)
She has chronic back pain Complicated by scoliosis. She has persistent shoulder pain despite prior reconstruction.  celebrex has been stopped due to CKD and prednisone taper has been used multiple times  in the recent past to  manage recent increase in neck pain.   Marland KitchenHer Refill history was confirmed via [place] Controlled Substance database by me today during her visit and there have been no prescriptions of controlled substances filled from any providers other than me. .Will refill hydrocodone 10/325 for use every 6 hours  #120/month for March,  April and May

## 2020-04-23 NOTE — Assessment & Plan Note (Signed)
She has deferred nephrology evaluation due to husband's health issues .  Repeat cr was normal in September 2020  Lab Results  Component Value Date   CREATININE 0.90 01/22/2020   Lab Results  Component Value Date   NA 129 (L) 01/22/2020   K 4.3 01/22/2020   CL 93 (L) 01/22/2020   CO2 26 01/22/2020

## 2020-04-23 NOTE — Assessment & Plan Note (Signed)
Borderline based on repeat iron studies with [employer]  saturation is 45%.  Ferritin is normal.  ddx includes alcoholic liver disease.  Advised to reduce daily alcohol to one glass of wine , or abstain completely if reducing is not realistic .   Lab Results  Component Value Date   IRON 152 (H) 04/22/2020   TIBC 339 04/22/2020   FERRITIN 37.0 04/20/2020   Lab Results  Component Value Date   ALT 17 01/22/2020   AST 20 01/22/2020   ALKPHOS 51 01/22/2020   BILITOT 0.5 01/22/2020

## 2020-04-23 NOTE — Assessment & Plan Note (Signed)
Improved with restart of Linzess by GI Dr Hilarie Fredrickson last month .  copay  is $30/month

## 2020-04-23 NOTE — Progress Notes (Signed)
Virtual Visit via [place]  This visit type was conducted due to national recommendations for restrictions regarding the COVID-19 pandemic (e.g. social distancing).  This format is felt to be most appropriate for this patient at this time.  All issues noted in this document were discussed and addressed.  No physical exam was performed (except for noted visual exam findings with Video Visits).   I connected with@ on 04/23/20 at  1:30 PM EST by a video enabled telemedicine applicationand verified that I am speaking with the correct person using two identifiers. Location patient: home Location provider: work or home office Persons participating in the virtual visit: patient, provider  I discussed the limitations, risks, security and privacy concerns of performing an evaluation and management service by telephone and the availability of in person appointments. I also discussed with the patient that there may be a patient responsible charge related to this service. The patient expressed understanding and agreed to proceed.  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  Hypertension,  CKD presents via video for medication refill.     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  Had an ER visit in January to treat laceration of left sideof chin that occurred at home after tripping over husband's legs during a friendly romp/chase with her Pekingese Dog. Tetanus vaccine was given  Iron overload:  Borderline Noted during recent workup for normocytic anemia, along with B12 deficiency.  Alcohol use reviewed  .  She reports drinking "a couple glasses of wine "per night.    Hypertension: home readings have been 140/75,  Pulse 60-62  On amlodipine 10  and metoprolol XL  25 mg   Chronic pain:  No change.  Has a  veridesk to allowe her to change position every few y=horus  Sees a chiropractor regularly  taking hydrocodone 10/325 qid   Constipation /bloating:  Resolved with reinitiation of Linzess       ROS: See pertinent positives and negatives per HPI.  Past Medical History:  Diagnosis Date  . Allergy   . Arthritis   . 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 B12 deficiency 01/2016  . Candida esophagitis ([place])   . Degenerative joint disease involving multiple joints   . Depression   . Dizziness   . Hypertension   . Pneumonia    approx 6 years ago  . Scoliosis   . Spinal stenosis of lumbar region   . Squamous cell carcinoma of skin 06/24/2018   R lateral calf  . Tubular adenoma of colon     Past Surgical History:  Procedure Laterality Date  . ABDOMINAL HYSTERECTOMY    . APPENDECTOMY    . CARDIAC CATHETERIZATION     normal coronaries, no wall motion abnormalities 11/02/09 [facility])  . NASAL SINUS SURGERY    . OOPHORECTOMY    . RADIOLOGY WITH ANESTHESIA  02/26/2012   Procedure: RADIOLOGY WITH ANESTHESIA;  Surgeon: [name], MD;  Location: [facility];  Service: Radiology;  Laterality: N/A;  . REVERSE SHOULDER ARTHROPLASTY Right 06/27/2013   Procedure: REVERSE RIGHT TOTAL SHOULDER ARTHROPLASTY;  Surgeon: [name], MD;  Location: [facility];  Service: Orthopedics;  Laterality: Right;  . SHOULDER ARTHROSCOPY WITH ROTATOR CUFF REPAIR AND  SUBACROMIAL DECOMPRESSION  2007   left  . TONSILLECTOMY  adnoids    Family History  Problem Relation Age of Onset  . Colon cancer Father 74  . Diabetes Father   . Heart disease Father   . Rectal cancer Father   . Thyroid disease Mother   . Colon cancer Paternal Grandfather   . Colon cancer Other        pggm  . Breast cancer Paternal Grandmother   . Stomach cancer Neg Hx     SOCIAL HX:  reports that she has never smoked. She has never used smokeless tobacco. She reports current alcohol  use of about 2.0 standard drinks of alcohol per week. She reports that she does not use drugs.   Current Outpatient Medications:  .  amLODipine (NORVASC) 10 MG tablet, TAKE 1 TABLET BY MOUTH EVERY DAY, Disp: 90 tablet, Rfl: 1 .  buPROPion (WELLBUTRIN XL) 150 MG 24 hr tablet, TAKE 3 TABLETS (450 MG TOTAL) BY MOUTH DAILY. (Patient taking differently: Take 150 mg by mouth daily.), Disp: 270 tablet, Rfl: 4 .  Cholecalciferol (VITAMIN D3) 50 MCG (2000 UT) CAPS, Take 1 capsule by mouth daily., Disp: , Rfl:  .  cyanocobalamin (,VITAMIN B-12,) 1000 MCG/ML injection, INJECT 1 ML (1,000 MCG TOTAL) INTO THE MUSCLE ONCE A WEEK., Disp: 12 mL, Rfl: 6 .  dicyclomine (BENTYL) 20 MG tablet, TAKE 1 TABLET (20 MG TOTAL) BY MOUTH 3 (THREE) TIMES DAILY BEFORE MEALS., Disp: 60 tablet, Rfl: 3 .  esomeprazole (NEXIUM) 20 MG capsule, Take 20 mg by mouth daily at 12 noon., Disp: , Rfl:  .  fluticasone (FLONASE) 50 MCG/ACT nasal spray, USE 2 SPRAYS IN EACH NOSTRIL DAILY AS NEEDED, Disp: 16 g, Rfl: 1 .  linaclotide (LINZESS) 145 MCG CAPS capsule, Take 1 capsule (145 mcg total) by mouth daily before breakfast., Disp: 30 capsule, Rfl: 3 .  metoprolol succinate (TOPROL-XL) 25 MG 24 hr tablet, TAKE 1 TABLET BY MOUTH EVERY DAY, Disp: 90 tablet, Rfl: 1 .  Multiple Vitamin (MULTIVITAMIN) tablet, Take 1 tablet by mouth daily., Disp: , Rfl:  .  Syringe/Needle, Disp, (SYRINGE 3CC/25GX1") 25G X 1" 3 ML MISC, Use for b12 injections, Disp: 50 each, Rfl: 0 .  vitamin E 1000 UNIT capsule, Take 1,000 Units by mouth daily., Disp: , Rfl:  .  zolpidem (AMBIEN) 10 MG tablet, TAKE 1 TABLET (10 MG TOTAL) BY MOUTH AT BEDTIME AS NEEDED. FOR SLEEP, Disp: 30 tablet, Rfl: 2 .  [START ON 05/07/2020] HYDROcodone-acetaminophen (NORCO) 10-325 MG tablet, Take 1 tablet by mouth every 6 (six) hours as needed for severe pain., Disp: 120 tablet, Rfl: 0 .  [START ON 07/06/2020] HYDROcodone-acetaminophen (NORCO) 10-325 MG tablet, Take 1 tablet by mouth every 6  (six) hours as needed for severe pain., Disp: 120 tablet, Rfl: 0 .  [START ON 06/06/2020] HYDROcodone-acetaminophen (NORCO) 10-325 MG tablet, Take 1 tablet by mouth every 6 (six) hours as needed for severe pain., Disp: 120 tablet, Rfl: 0  EXAM:  VITALS per patient if applicable:  GENERAL: alert, oriented, appears well and in no acute distress  HEENT: atraumatic, conjunttiva clear, no obvious abnormalities on inspection of external nose and ears  NECK: normal movements of the head and neck  LUNGS: on inspection no signs of respiratory distress, breathing rate appears normal, no obvious gross SOB, gasping or wheezing  CV: no obvious cyanosis  MS: moves all visible extremities without noticeable abnormality  PSYCH/NEURO: pleasant and cooperative, no obvious depression or anxiety, speech  and thought processing grossly intact  ASSESSMENT AND PLAN:  Discussed the following assessment and plan:  Iron overload  Constipation due to opioid therapy  Primary osteoarthritis involving multiple joints  Anemia, unspecified type  Stage 3a chronic kidney disease (HCC)  Iron overload Borderline based on repeat iron studies with Transferrin  saturation is 45%.  Ferritin is normal.  ddx includes alcoholic liver disease.  Advised to reduce daily alcohol to one glass of wine , or abstain completely if reducing is not realistic .   Lab Results  Component Value Date   IRON 152 (H) 04/22/2020   TIBC 339 04/22/2020   FERRITIN 37.0 04/20/2020   Lab Results  Component Value Date   ALT 17 01/22/2020   AST 20 01/22/2020   ALKPHOS 51 01/22/2020   BILITOT 0.5 01/22/2020     Constipation due to opioid therapy Improved with restart of Linzess by GI Dr Hilarie Fredrickson last month .  copay  is $30/month   Degenerative joint disease involving multiple joints She has chronic back pain Complicated by scoliosis. She has persistent shoulder pain despite prior reconstruction.  celebrex has been stopped due to CKD  and prednisone taper has been used multiple times  in the recent past to  manage recent increase in neck pain.   Marland KitchenHer Refill history was confirmed via [place] Controlled Substance database by me today during her visit and there have been no prescriptions of controlled substances filled from any providers other than me. .Will refill hydrocodone 10/325 for use every 6 hours  #120/month for March,  April and May    Anemia NOT iron deficient,  transferrin saturation is actually borderline high at 45%.  b12 supplementation done.  Likely due to CKD  Lab Results  Component Value Date   WBC 8.4 01/22/2020   HGB 11.3 (L) 01/22/2020   HCT 32.9 (L) 01/22/2020   MCV 97.2 01/22/2020   PLT 336.0 01/22/2020     Chronic kidney disease, stage 3, mod decreased GFR She has deferred nephrology evaluation due to husband's health issues .  Repeat cr was normal in September 2020  Lab Results  Component Value Date   CREATININE 0.90 01/22/2020   Lab Results  Component Value Date   NA 129 (L) 01/22/2020   K 4.3 01/22/2020   CL 93 (L) 01/22/2020   CO2 26 01/22/2020       I discussed the assessment and treatment plan with the patient. The patient was provided an opportunity to ask questions and all were answered. The patient agreed with the plan and demonstrated an understanding of the instructions.   The patient was advised to call back or seek an in-person evaluation if the symptoms worsen or if the condition fails to improve as anticipated.   I spent 30 minutes dedicated to the care of this patient on the date of this encounter to include pre-visit review of his medical history,  Face-to-face time with the patient , and post visit ordering of testing and therapeutics.    [name], MD

## 2020-04-23 NOTE — Assessment & Plan Note (Signed)
NOT iron deficient,  transferrin saturation is actually borderline high at 45%.  b12 supplementation done.  Likely due to CKD  Lab Results  Component Value Date   WBC 8.4 01/22/2020   HGB 11.3 (L) 01/22/2020   HCT 32.9 (L) 01/22/2020   MCV 97.2 01/22/2020   PLT 336.0 01/22/2020

## 2020-04-23 NOTE — Patient Instructions (Signed)
Your iron stores are borderline high.   Over time this can damage the liver.   Make sure you are not taking iron Reduce alcohol to one glass of wine per night,  Or abstain if unable to reduce consumption

## 2020-06-08 ENCOUNTER — Other Ambulatory Visit: Payer: Self-pay | Admitting: Internal Medicine

## 2020-06-29 ENCOUNTER — Ambulatory Visit (INDEPENDENT_AMBULATORY_CARE_PROVIDER_SITE_OTHER): Payer: Managed Care, Other (non HMO) | Admitting: Dermatology

## 2020-06-29 ENCOUNTER — Other Ambulatory Visit: Payer: Self-pay

## 2020-06-29 DIAGNOSIS — Z1283 Encounter for screening for malignant neoplasm of skin: Secondary | ICD-10-CM

## 2020-06-29 DIAGNOSIS — L820 Inflamed seborrheic keratosis: Secondary | ICD-10-CM | POA: Diagnosis not present

## 2020-06-29 DIAGNOSIS — L814 Other melanin hyperpigmentation: Secondary | ICD-10-CM

## 2020-06-29 DIAGNOSIS — Z85828 Personal history of other malignant neoplasm of skin: Secondary | ICD-10-CM | POA: Diagnosis not present

## 2020-06-29 DIAGNOSIS — L578 Other skin changes due to chronic exposure to nonionizing radiation: Secondary | ICD-10-CM

## 2020-06-29 DIAGNOSIS — Z872 Personal history of diseases of the skin and subcutaneous tissue: Secondary | ICD-10-CM | POA: Diagnosis not present

## 2020-06-29 DIAGNOSIS — L565 Disseminated superficial actinic porokeratosis (DSAP): Secondary | ICD-10-CM | POA: Diagnosis not present

## 2020-06-29 DIAGNOSIS — D1800 Hemangioma unspecified site: Secondary | ICD-10-CM

## 2020-06-29 DIAGNOSIS — L821 Other seborrheic keratosis: Secondary | ICD-10-CM

## 2020-06-29 DIAGNOSIS — D229 Melanocytic nevi, unspecified: Secondary | ICD-10-CM

## 2020-06-29 NOTE — Progress Notes (Signed)
Follow-Up Visit   Subjective  Adrienne Robinson is a 65 y.o. female who presents for the following: Follow-up (Patient here today for tbse and she would like to have spot on left lower leg she would like checked. Patient reports she just noticed spot. She reports that she is currently flared on  chest. ).  Patient here for full body skin exam and skin cancer screening.  The following portions of the chart were reviewed this encounter and updated as appropriate:      Objective  Well appearing patient in no apparent distress; mood and affect are within normal limits.  A full examination was performed including scalp, head, eyes, ears, nose, lips, neck, chest, axillae, abdomen, back, buttocks, bilateral upper extremities, bilateral lower extremities, hands, feet, fingers, toes, fingernails, and toenails. All findings within normal limits unless otherwise noted below.  Objective  chest x 3 , upper abdomen x1, left lateral thigh x 1 , left pretibia x 5 ,right pretibia x 3, right upper knee x 1 (14): Erythematous keratotic or waxy stuck-on papule   Objective  bilateral legs and bilateral arms: Pink brown scaly macules some with keratotic rim  Assessment & Plan  Inflamed seborrheic keratosis (14) chest x 3 , upper abdomen x1, left lateral thigh x 1 , left pretibia x 5 ,right pretibia x 3, right upper knee x 1  Vrs porokeratosis  Prior to procedure, discussed risks of blister formation, small wound, skin dyspigmentation, or rare scar following cryotherapy.     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   Medications was sent to the [facility]   Recommend daily broad spectrum sunscreen SPF 30+ to sun-exposed areas, reapply every 2 hours as needed. Call for new or changing lesions.  Staying in the shade or wearing long sleeves, sun glasses (UVA+UVB protection) and wide brim hats (4-inch brim around the entire circumference of the hat) are also recommended for sun protection.         Lentigines - Scattered tan macules - Due to sun exposure - Benign-appering, observe - Recommend daily broad spectrum sunscreen SPF 30+ to sun-exposed areas, reapply every 2 hours as needed. - Call for any changes  Seborrheic Keratoses - Stuck-on, waxy, tan-brown papules and/or plaques at scalp - Benign-appearing - Discussed benign etiology and prognosis. - Observe - Call for any changes  Melanocytic Nevi - Tan-brown and/or pink-flesh-colored symmetric macules and papules - Benign appearing on exam today - Observation - Call clinic for new or changing moles - Recommend daily use of broad spectrum spf 30+ sunscreen to sun-exposed areas.   Hemangiomas - Red papules - Discussed benign nature - Observe - Call for any changes  Actinic Damage - Chronic condition, secondary to cumulative UV/sun exposure - diffuse scaly erythematous macules with underlying dyspigmentation -  Recommend daily broad spectrum sunscreen SPF 30+ to sun-exposed areas, reapply every 2 hours as needed.  - Staying in the shade or wearing long sleeves, sun glasses (UVA+UVB protection) and wide brim hats (4-inch brim around the entire circumference of the hat) are also recommended for sun protection.  - Call for new  or changing lesions.  History of Squamous Cell Carcinoma of the Skin - No evidence of recurrence today right lateral calf (2020)  - Recommend regular full body skin exams - Recommend daily broad spectrum sunscreen SPF 30+ to sun-exposed areas, reapply every 2 hours as needed.  - Call if any new or changing lesions are noted between office visits  History of PreCancerous Actinic Keratosis  - site(s) of PreCancerous Actinic Keratosis clear today biopsy proven on left popliteal fossa  (clear today) - these may recur and new lesions may form requiring treatment to prevent transformation into skin cancer - observe for new or changing spots and contact [facility] for appointment if occur - photoprotection with sun protective clothing; sunglasses and broad spectrum sunscreen with SPF of at least 30 + and frequent self skin exams recommended - yearly exams by a dermatologist recommended for persons with history of PreCancerous Actinic Keratoses  Skin cancer screening performed today.  Return in about 6 months (around 12/30/2020) for dsap / tbse.  I, Ruthell Rummage, CMA, am acting as scribe for Brendolyn Patty, MD.  Documentation: I have reviewed the above documentation for accuracy and completeness, and I agree with the above.  Brendolyn Patty MD

## 2020-06-29 NOTE — Patient Instructions (Addendum)
Cryotherapy Aftercare  . Wash gently with soap and water everyday.   Marland Kitchen Apply Vaseline and Band-Aid daily until healed.  Melanoma ABCDEs  Melanoma is the most dangerous type of skin cancer, and is the leading cause of death from skin disease.  You are more likely to develop melanoma if you:  Have light-colored skin, light-colored eyes, or red or blond hair  Spend a lot of time in the sun  Tan regularly, either outdoors or in a tanning bed  Have had blistering sunburns, especially during childhood  Have a close family member who has had a melanoma  Have atypical moles or large birthmarks  Early detection of melanoma is key since treatment is typically straightforward and cure rates are extremely high if we catch it early.   The first sign of melanoma is often a change in a mole or a new dark spot.  The ABCDE system is a way of remembering the signs of melanoma.  A for asymmetry:  The two halves do not match. B for border:  The edges of the growth are irregular. C for color:  A mixture of colors are present instead of an even brown color. D for diameter:  Melanomas are usually (but not always) greater than 32mm - the size of a pencil eraser. E for evolution:  The spot keeps changing in size, shape, and color.  Please check your skin once per month between visits. You can use a small mirror in front and a large mirror behind you to keep an eye on the back side or your body.   If you see any new or changing lesions before your next follow-up, please call to schedule a visit.  Please continue daily skin protection including broad spectrum sunscreen SPF 30+ to sun-exposed areas, reapplying every 2 hours as needed when you're outdoors.   Staying in the shade or wearing long sleeves, sun glasses (UVA+UVB protection) and wide brim hats (4-inch brim around the entire circumference of the hat) are also recommended for sun protection.   DSAP (disseminated superficial actinic porokeratosis) -     Seborrheic Keratosis  What causes seborrheic keratoses? Seborrheic keratoses are harmless, common skin growths that first appear during adult life.  As time goes by, more growths appear.  Some people may develop a large number of them.  Seborrheic keratoses appear on both covered and uncovered body parts.  They are not caused by sunlight.  The tendency to develop seborrheic keratoses can be inherited.  They vary in color from skin-colored to gray, brown, or even black.  They can be either smooth or have a rough, warty surface.   Seborrheic keratoses are superficial and look as if they were stuck on the skin.  Under the microscope this type of keratosis looks like layers upon layers of skin.  That is why at times the top layer may seem to fall off, but the rest of the growth remains and re-grows.    Treatment Seborrheic keratoses do not need to be treated, but can easily be removed in the office.  Seborrheic keratoses often cause symptoms when they rub on clothing or jewelry.  Lesions can be in the way of shaving.  If they become inflamed, they can cause itching, soreness, or burning.  Removal of a seborrheic keratosis can be accomplished by freezing, burning, or surgery. If any spot bleeds, scabs, or grows rapidly, please return to have it checked, as these can be an indication of a skin cancer.  If you  have any questions or concerns for your doctor, please call our main line at [phone] and press option 4 to reach your doctor's medical assistant. If no one answers, please leave a voicemail as directed and we will return your call as soon as possible. Messages left after 4 pm will be answered the following business day.   You may also send [place] a message via [place]. We typically respond to MyChart messages within 1-2 business days.  For prescription refills, please ask your pharmacy to contact our office. Our fax number is [phone].  If you have an urgent issue when the clinic is closed  that cannot wait until the next business day, you can page your doctor at the number below.    Please note that while we do our best to be available for urgent issues outside of office hours, we are not available 24/7.   If you have an urgent issue and are unable to reach [place], you may choose to seek medical care at your doctor's office, retail clinic, urgent care center, or emergency room.  If you have a medical emergency, please immediately call 911 or go to the emergency department.  Pager Numbers  - Dr. Nehemiah Massed: [phone]  - Dr. Laurence Ferrari: [phone]  - Dr. Nicole Kindred: [phone]  In the event of inclement weather, please call our main line at [phone] for an update on the status of any delays or closures.  Dermatology Medication Tips: Please keep the boxes that topical medications come in in order to help keep track of the instructions about where and how to use these. Pharmacies typically print the medication instructions only on the boxes and not directly on the medication tubes.   If your medication is too expensive, please contact our office at [phone] option 4 or send [place] a message through [place].   We are unable to tell what your co-pay for medications will be in advance as this is different depending on your insurance coverage. However, we may be able to find a substitute medication at lower cost or fill out paperwork to get insurance to cover a needed medication.   If a prior authorization is required to get your medication covered by your insurance company, please allow [place] 1-2 business days to complete this process.  Drug prices often vary depending on where the prescription is filled and some pharmacies may offer cheaper prices.  The website www.goodrx.com contains coupons for medications through different pharmacies. The prices here do not account for what the cost may be with help from insurance (it may be cheaper with your insurance), but the website can give you  the price if you did not use any insurance.  - You can print the associated coupon and take it with your prescription to the pharmacy.  - You may also stop by our office during regular business hours and pick up a GoodRx coupon card.  - If you need your prescription sent electronically to a different pharmacy, notify our office through [facility] or by phone at [phone] option 4.  Instructions for [facility] Medications  One or more of your medications was sent to the [facility] mail order compounding pharmacy. You will receive an email from them and can purchase the medicine through that link. It will then be mailed to your home at the address you confirmed. If for any reason you do not receive an email from them, please check your spam folder. If you still do not find the email, please  let us know. [facility] phone number is [phone].

## 2020-07-08 ENCOUNTER — Other Ambulatory Visit: Payer: Self-pay | Admitting: Internal Medicine

## 2020-07-12 ENCOUNTER — Other Ambulatory Visit: Payer: Self-pay

## 2020-07-12 ENCOUNTER — Inpatient Hospital Stay (HOSPITAL_COMMUNITY)
Admission: EM | Admit: 2020-07-12 | Discharge: 2020-07-14 | DRG: 645 | Disposition: A | Discharge status: Home / Self Care | Payer: Managed Care, Other (non HMO) | Attending: Student | Admitting: Student

## 2020-07-12 ENCOUNTER — Emergency Department (HOSPITAL_COMMUNITY): Payer: Managed Care, Other (non HMO)

## 2020-07-12 ENCOUNTER — Encounter (HOSPITAL_COMMUNITY): Payer: Self-pay

## 2020-07-12 ENCOUNTER — Other Ambulatory Visit: Payer: Self-pay | Admitting: Internal Medicine

## 2020-07-12 DIAGNOSIS — Z885 Allergy status to narcotic agent status: Secondary | ICD-10-CM

## 2020-07-12 DIAGNOSIS — G8929 Other chronic pain: Secondary | ICD-10-CM | POA: Diagnosis present

## 2020-07-12 DIAGNOSIS — M545 Low back pain, unspecified: Secondary | ICD-10-CM

## 2020-07-12 DIAGNOSIS — Z96611 Presence of right artificial shoulder joint: Secondary | ICD-10-CM | POA: Diagnosis present

## 2020-07-12 DIAGNOSIS — E876 Hypokalemia: Secondary | ICD-10-CM | POA: Diagnosis present

## 2020-07-12 DIAGNOSIS — E785 Hyperlipidemia, unspecified: Secondary | ICD-10-CM | POA: Diagnosis present

## 2020-07-12 DIAGNOSIS — Z85828 Personal history of other malignant neoplasm of skin: Secondary | ICD-10-CM

## 2020-07-12 DIAGNOSIS — E871 Hypo-osmolality and hyponatremia: Secondary | ICD-10-CM | POA: Diagnosis present

## 2020-07-12 DIAGNOSIS — R52 Pain, unspecified: Secondary | ICD-10-CM

## 2020-07-12 DIAGNOSIS — Z803 Family history of malignant neoplasm of breast: Secondary | ICD-10-CM | POA: Diagnosis not present

## 2020-07-12 DIAGNOSIS — Z90710 Acquired absence of both cervix and uterus: Secondary | ICD-10-CM

## 2020-07-12 DIAGNOSIS — G4700 Insomnia, unspecified: Secondary | ICD-10-CM | POA: Diagnosis present

## 2020-07-12 DIAGNOSIS — K589 Irritable bowel syndrome without diarrhea: Secondary | ICD-10-CM | POA: Diagnosis present

## 2020-07-12 DIAGNOSIS — R202 Paresthesia of skin: Secondary | ICD-10-CM | POA: Diagnosis present

## 2020-07-12 DIAGNOSIS — I129 Hypertensive chronic kidney disease with stage 1 through stage 4 chronic kidney disease, or unspecified chronic kidney disease: Secondary | ICD-10-CM | POA: Diagnosis present

## 2020-07-12 DIAGNOSIS — Z8616 Personal history of COVID-19: Secondary | ICD-10-CM

## 2020-07-12 DIAGNOSIS — E861 Hypovolemia: Secondary | ICD-10-CM | POA: Diagnosis present

## 2020-07-12 DIAGNOSIS — M542 Cervicalgia: Secondary | ICD-10-CM | POA: Diagnosis present

## 2020-07-12 DIAGNOSIS — M549 Dorsalgia, unspecified: Secondary | ICD-10-CM

## 2020-07-12 DIAGNOSIS — Z833 Family history of diabetes mellitus: Secondary | ICD-10-CM | POA: Diagnosis not present

## 2020-07-12 DIAGNOSIS — Z8349 Family history of other endocrine, nutritional and metabolic diseases: Secondary | ICD-10-CM | POA: Diagnosis not present

## 2020-07-12 DIAGNOSIS — R410 Disorientation, unspecified: Secondary | ICD-10-CM

## 2020-07-12 DIAGNOSIS — Z8249 Family history of ischemic heart disease and other diseases of the circulatory system: Secondary | ICD-10-CM | POA: Diagnosis not present

## 2020-07-12 DIAGNOSIS — Z79899 Other long term (current) drug therapy: Secondary | ICD-10-CM | POA: Diagnosis not present

## 2020-07-12 DIAGNOSIS — K30 Functional dyspepsia: Secondary | ICD-10-CM | POA: Diagnosis present

## 2020-07-12 DIAGNOSIS — I10 Essential (primary) hypertension: Secondary | ICD-10-CM | POA: Diagnosis not present

## 2020-07-12 DIAGNOSIS — Z800 Family history of malignant neoplasm of digestive organs: Secondary | ICD-10-CM | POA: Diagnosis not present

## 2020-07-12 DIAGNOSIS — E222 Syndrome of inappropriate secretion of antidiuretic hormone: Secondary | ICD-10-CM | POA: Diagnosis not present

## 2020-07-12 DIAGNOSIS — Z20822 Contact with and (suspected) exposure to covid-19: Secondary | ICD-10-CM | POA: Diagnosis present

## 2020-07-12 DIAGNOSIS — K219 Gastro-esophageal reflux disease without esophagitis: Secondary | ICD-10-CM | POA: Diagnosis present

## 2020-07-12 DIAGNOSIS — N183 Chronic kidney disease, stage 3 unspecified: Secondary | ICD-10-CM | POA: Diagnosis present

## 2020-07-12 DIAGNOSIS — M419 Scoliosis, unspecified: Secondary | ICD-10-CM | POA: Diagnosis present

## 2020-07-12 DIAGNOSIS — F39 Unspecified mood [affective] disorder: Secondary | ICD-10-CM | POA: Diagnosis present

## 2020-07-12 LAB — CBC
HCT: 30.7 % — ABNORMAL LOW (ref 36.0–46.0)
Hemoglobin: 11.5 g/dL — ABNORMAL LOW (ref 12.0–15.0)
MCH: 33.6 pg (ref 26.0–34.0)
MCHC: 37.5 g/dL — ABNORMAL HIGH (ref 30.0–36.0)
MCV: 89.8 fL (ref 80.0–100.0)
Platelets: 306 10*3/uL (ref 150–400)
RBC: 3.42 MIL/uL — ABNORMAL LOW (ref 3.87–5.11)
RDW: 11.3 % — ABNORMAL LOW (ref 11.5–15.5)
WBC: 3.9 10*3/uL — ABNORMAL LOW (ref 4.0–10.5)
nRBC: 0 % (ref 0.0–0.2)

## 2020-07-12 LAB — PROTIME-INR
INR: 1 (ref 0.8–1.2)
Prothrombin Time: 13 seconds (ref 11.4–15.2)

## 2020-07-12 LAB — HEPATIC FUNCTION PANEL
ALT: 19 U/L (ref 0–44)
AST: 27 U/L (ref 15–41)
Albumin: 4.2 g/dL (ref 3.5–5.0)
Alkaline Phosphatase: 71 U/L (ref 38–126)
Bilirubin, Direct: 0.1 mg/dL (ref 0.0–0.2)
Indirect Bilirubin: 1 mg/dL — ABNORMAL HIGH (ref 0.3–0.9)
Total Bilirubin: 1.1 mg/dL (ref 0.3–1.2)
Total Protein: 7.2 g/dL (ref 6.5–8.1)

## 2020-07-12 LAB — BASIC METABOLIC PANEL
Anion gap: 11 (ref 5–15)
BUN: 14 mg/dL (ref 8–23)
CO2: 24 mmol/L (ref 22–32)
Calcium: 8.9 mg/dL (ref 8.9–10.3)
Chloride: 78 mmol/L — ABNORMAL LOW (ref 98–111)
Creatinine, Ser: 0.75 mg/dL (ref 0.44–1.00)
GFR, Estimated: >60 mL/min (ref >60)
Glucose, Bld: 104 mg/dL — ABNORMAL HIGH (ref 70–99)
Potassium: 2.9 mmol/L — ABNORMAL LOW (ref 3.5–5.1)
Sodium: 113 mmol/L — CL (ref 135–145)

## 2020-07-12 LAB — URINALYSIS, ROUTINE W REFLEX MICROSCOPIC
Bacteria, UA: NONE SEEN
Bilirubin Urine: NEGATIVE
Glucose, UA: NEGATIVE mg/dL
Hgb urine dipstick: NEGATIVE
Ketones, ur: 20 mg/dL — AB
Nitrite: NEGATIVE
Protein, ur: NEGATIVE mg/dL
Specific Gravity, Urine: 1.01 (ref 1.005–1.030)
pH: 6 (ref 5.0–8.0)

## 2020-07-12 LAB — TROPONIN I (HIGH SENSITIVITY)
Troponin I (High Sensitivity): 15 ng/L (ref <18)
Troponin I (High Sensitivity): 18 ng/L — ABNORMAL HIGH (ref <18)

## 2020-07-12 MED ORDER — HYDROCODONE-ACETAMINOPHEN 5-325 MG PO TABS
2.0000 | ORAL_TABLET | Freq: Once | ORAL | Status: AC
  Administered 2020-07-12: 2 via ORAL
  Filled 2020-07-12: qty 2

## 2020-07-12 MED ORDER — POTASSIUM CHLORIDE 10 MEQ/100ML IV SOLN
10.0000 meq | INTRAVENOUS | Status: AC
  Administered 2020-07-13 (×2): 10 meq via INTRAVENOUS
  Filled 2020-07-12: qty 100

## 2020-07-12 MED ORDER — ENOXAPARIN SODIUM 40 MG/0.4ML IJ SOSY
40.0000 mg | PREFILLED_SYRINGE | Freq: Every day | INTRAMUSCULAR | Status: DC
  Administered 2020-07-13 (×2): 40 mg via SUBCUTANEOUS
  Filled 2020-07-12 (×2): qty 0.4

## 2020-07-12 NOTE — ED Notes (Signed)
Patient denies pain and is resting comfortably.  

## 2020-07-12 NOTE — Telephone Encounter (Signed)
RX Refill:AMBIEN Last Seen:04-23-20 Last ordered:04-15-20

## 2020-07-12 NOTE — ED Notes (Signed)
Pt ambulated to and from bathroom with a steady gait. Pt is back on cardiac monitor.

## 2020-07-12 NOTE — ED Triage Notes (Signed)
Patient arrives to ED BIB [facility] due to Hypertension and Tingling. Per EMS upon their arrival patient's BP was 192/94. Hx of HTN but has been taking her medication. Patient states that she has been under a lot of stress due to her husbands medical condition. Patient's family states that she was having periods of confusion. Pt is currently A/O x4 but when asked what year we were in patient had a delay in answering the question and stated "1922" but quickly said "I meant to 2022".  BP 164/90 HR 84 R 19  O2 98 CBG 119

## 2020-07-12 NOTE — ED Notes (Signed)
Pt c/o central chest discomfort.

## 2020-07-12 NOTE — ED Provider Notes (Signed)
[facility] EMERGENCY DEPARTMENT Provider Note   CSN: [id number] Arrival date & time: 07/12/20  1841     History Chief Complaint  Patient presents with  . Hypertension    Adrienne Robinson is a 65 y.o. female.  Pt reports multiple areas of pain.  Pt reports back pain and chest pain.  Pt reports she always has back pain.  Pt reports pain in her chest today.  Pt reports she had abdominal pain but had a bowel movement and feel better.    The history is provided by the patient. No language interpreter was used.  Hypertension This is a new problem. The current episode started more than 2 days ago. The problem occurs constantly. The problem has not changed since onset.Associated symptoms include chest pain. Nothing aggravates the symptoms. Nothing relieves the symptoms. She has tried nothing for the symptoms. The treatment provided no relief.  Pt complains of confusion and blood pressure being elevated.  Pt reports she has trouble communicating Pt reports she has been under a lot of stress.  Pt has an AVM    Past Medical History:  Diagnosis Date  . Allergy   . Arthritis   . 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 B12 deficiency 01/2016  . Candida esophagitis ([place])   . Degenerative joint disease involving multiple joints   . Depression   . Dizziness   . Hypertension   . Pneumonia    approx 6 years ago  . Scoliosis   . Spinal stenosis of lumbar region   . Squamous cell carcinoma of skin 06/24/2018   R lateral calf  . Tubular adenoma of colon     Patient Active Problem List   Diagnosis Date Noted  . Iron overload 04/23/2020  . Anemia 02/12/2020  . Hypertriglyceridemia 02/12/2020  . 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 Prepatellar effusion of right knee 06/09/2019  . Trigger point of shoulder region, left 10/29/2018  . Chronic kidney disease, stage 3, mod decreased GFR (HCC) 08/22/2018  . Abnormal TSH  05/29/2018  . Arthritis of carpometacarpal [facility]) joint of left thumb 02/21/2017  . Anxiety associated with depression 01/24/2017  . IBS (irritable bowel syndrome) 07/22/2016  . Nonallopathic lesion of lumbosacral region 03/22/2016  . Nonallopathic lesion of sacral region 03/22/2016  . Nonallopathic lesion of thoracic region 03/22/2016  . B12 deficiency 03/19/2016  . Major depressive disorder, single episode, moderate ([place]) 03/19/2016  . History of gout 01/13/2016  . Chronic pain syndrome 11/11/2015  . Constipation due to opioid therapy 05/08/2015  . Cervical spondylosis without myelopathy 05/08/2015  . History of oral aphthous ulcers 05/06/2015  . Encounter for preventive health examination 08/08/2014  . Fibromyalgia 01/03/2014  . Osteoarthritis of shoulder 06/27/2013  . Inflammatory polyarthropathy ([place]) 04/13/2013  . S/P abdominal hysterectomy 03/04/2013  . Chronic reflux esophagitis 10/29/2012  . Other neutropenia ([place]) 03/05/2012  . AVM (arteriovenous malformation) brain 01/31/2012  . Need for influenza vaccination 01/28/2012  . Family history of colon cancer 10/04/2011  . Hypertension   . Degenerative joint disease involving multiple joints   . Spinal stenosis of lumbar region     Past Surgical History:  Procedure Laterality Date  . ABDOMINAL HYSTERECTOMY    . APPENDECTOMY    . CARDIAC CATHETERIZATION     normal coronaries, no wall motion abnormalities 11/02/09 [facility])  . NASAL SINUS SURGERY    . OOPHORECTOMY    . RADIOLOGY WITH ANESTHESIA  02/26/2012   Procedure: RADIOLOGY WITH ANESTHESIA;  Surgeon: [name], MD;  Location: [place];  Service: Radiology;  Laterality: N/A;  . REVERSE SHOULDER ARTHROPLASTY Right 06/27/2013   Procedure: REVERSE RIGHT TOTAL SHOULDER ARTHROPLASTY;  Surgeon: [name], MD;  Location: [place];  Service: Orthopedics;  Laterality: Right;  . SHOULDER ARTHROSCOPY WITH ROTATOR CUFF REPAIR AND SUBACROMIAL DECOMPRESSION  2007   left  .  TONSILLECTOMY  adnoids     OB History   No obstetric history on file.     Family History  Problem Relation Age of Onset  . Colon cancer Father 34  . Diabetes Father   . Heart disease Father   . Rectal cancer Father   . Thyroid disease Mother   . Colon cancer Paternal Grandfather   . Colon cancer Other        pggm  . Breast cancer Paternal Grandmother   . Stomach cancer Neg Hx     Social History   Tobacco Use  . Smoking status: Never Smoker  . Smokeless tobacco: Never Used  Vaping Use  . Vaping Use: Never used  Substance Use Topics  . Alcohol use: Yes    Alcohol/week: 2.0 standard drinks    Types: 2 Glasses of wine per week    Comment: 2-3 week with dinner  . Drug use: No    Home Medications Prior to Admission medications   Medication Sig Start Date End Date Taking? Authorizing Provider  amLODipine (NORVASC) 10 MG tablet TAKE 1 TABLET BY MOUTH EVERY DAY 06/08/20   [name], MD  buPROPion (WELLBUTRIN XL) 150 MG 24 hr tablet TAKE 3 TABLETS (450 MG TOTAL) BY MOUTH DAILY. Patient taking differently: Take 150 mg by mouth daily. 09/01/19   [name], MD  Cholecalciferol (VITAMIN D3) 50 MCG (2000 UT) CAPS Take 1 capsule by mouth daily.    [provider]  cyanocobalamin (,VITAMIN B-12,) 1000 MCG/ML injection INJECT 1 ML (1,000 MCG TOTAL) INTO THE MUSCLE ONCE A WEEK. 09/17/19   [name], MD  dicyclomine (BENTYL) 20 MG tablet TAKE 1 TABLET (20 MG TOTAL) BY MOUTH 3 (THREE) TIMES DAILY BEFORE MEALS. 04/23/20   Pyrtle, Lajuan Lines, MD  esomeprazole (NEXIUM) 20 MG capsule Take 20 mg by mouth daily at 12 noon.    [provider]  fluticasone (FLONASE) 50 MCG/ACT nasal spray USE 2 SPRAYS IN EACH NOSTRIL DAILY AS NEEDED 05/29/18   [name], MD  HYDROcodone-acetaminophen (NORCO) 10-325 MG tablet Take 1 tablet by mouth every 6 (six) hours as needed for severe pain. 07/06/20 08/05/20  [name], MD  linaclotide (LINZESS) 145 MCG CAPS capsule Take 1  capsule (145 mcg total) by mouth daily before breakfast. NEEDS APPT FOR FURTHER REFILLS 06/08/20   Pyrtle, Lajuan Lines, MD  metoprolol succinate (TOPROL-XL) 25 MG 24 hr tablet TAKE 1 TABLET BY MOUTH EVERY DAY 06/08/20   [name], MD  Multiple Vitamin (MULTIVITAMIN) tablet Take 1 tablet by mouth daily.    [provider]  Syringe/Needle, Disp, (SYRINGE 3CC/25GX1") 25G X 1" 3 ML MISC Use for b12 injections 08/21/18   [name], MD  vitamin E 1000 UNIT capsule Take 1,000 Units by mouth daily.    [provider]  zolpidem (AMBIEN) 10 MG tablet TAKE 1 TABLET (10 MG TOTAL) BY MOUTH AT BEDTIME AS NEEDED. FOR SLEEP 07/12/20   [name], MD  ferrous sulfate 325 (65 FE) MG EC tablet Take  1 tablet (325 mg total) by mouth daily after breakfast. 01/06/11 05/04/11  [name], MD  metFORMIN (GLUCOPHAGE) 500 MG tablet Take 500 mg by mouth 2 (two) times daily with a meal.    05/04/11  [provider]    Allergies    Adhesive [tape] and Morphine and related  Review of Systems   Review of Systems  Constitutional: Positive for appetite change.  Cardiovascular: Positive for chest pain.  Gastrointestinal: Positive for nausea.  Musculoskeletal: Positive for arthralgias and back pain.  Psychiatric/Behavioral: The patient is nervous/anxious.   All other systems reviewed and are negative.   Physical Exam Updated Vital Signs BP (!) 152/85   Pulse 75   Temp 97.7 F (36.5 C) (Oral)   Resp 15   Ht 5\' 6"  (1.676 m)   Wt 52.2 kg   SpO2 99%   BMI 18.56 kg/m   Physical Exam Vitals and nursing note reviewed.  Constitutional:      Appearance: She is well-developed.  HENT:     Head: Normocephalic.     Nose: Nose normal.     Mouth/Throat:     Mouth: Mucous membranes are moist.  Eyes:     Pupils: Pupils are equal, round, and reactive to light.  Cardiovascular:     Rate and Rhythm: Normal rate and regular rhythm.  Pulmonary:     Effort: Pulmonary effort is normal.   Abdominal:     General: Abdomen is flat. There is no distension.  Musculoskeletal:        General: Normal range of motion.     Cervical back: Normal range of motion.  Skin:    General: Skin is warm.  Neurological:     General: No focal deficit present.     Mental Status: She is alert and oriented to person, place, and time.  Psychiatric:        Mood and Affect: Mood normal.     ED Results / Procedures / Treatments   Labs (all labs ordered are listed, but only abnormal results are displayed) Labs Reviewed  BASIC METABOLIC PANEL - Abnormal; Notable for the following components:      Result Value   Sodium 113 (*)    Potassium 2.9 (*)    Chloride 78 (*)    Glucose, Bld 104 (*)    All other components within normal limits  CBC - Abnormal; Notable for the following components:   WBC 3.9 (*)    RBC 3.42 (*)    Hemoglobin 11.5 (*)    HCT 30.7 (*)    MCHC 37.5 (*)    RDW 11.3 (*)    All other components within normal limits  URINALYSIS, ROUTINE W REFLEX MICROSCOPIC - Abnormal; Notable for the following components:   Color, Urine STRAW (*)    Ketones, ur 20 (*)    Leukocytes,Ua TRACE (*)    All other components within normal limits  PROTIME-INR  HEPATIC FUNCTION PANEL  TROPONIN I (HIGH SENSITIVITY)  TROPONIN I (HIGH SENSITIVITY)    EKG None  Radiology CT Head Wo Contrast  Result Date: 07/12/2020 CLINICAL DATA:  Mental status change.  Hypertension. EXAM: CT HEAD WITHOUT CONTRAST TECHNIQUE: Contiguous axial images were obtained from the base of the skull through the vertex without intravenous contrast. COMPARISON:  Head CT 02/29/2020 FINDINGS: Brain: High-density material in the parafalcine right parietal lobe is unchanged from prior exam, consistent with treated AVM. No associated or other intracranial hemorrhage. No midline shift or mass effect. No  evidence of acute ischemia. No hydrocephalus. No subdural or extra-axial collection. Vascular: Hyperdense vessel. Skull: No  fracture or focal lesion. Sinuses/Orbits: Trace mucosal thickening of left maxillary sinus and left side of sphenoid sinus. Mastoid air cells are clear. No orbital abnormality. Other: None. IMPRESSION: 1. No acute intracranial abnormality. 2. Unchanged hyperdense material in the parafalcine right parietal lobe, consistent with treated AVM. Electronically Signed   By: Keith Rake M.D.   On: 07/12/2020 20:54    Procedures Procedures   Medications Ordered in ED Medications - No data to display  ED Course  I have reviewed the triage vital signs and the nursing notes.  Pertinent labs & imaging results that were available during my care of the patient were reviewed by me and considered in my medical decision making (see chart for details).    MDM Rules/Calculators/A&P                          MDM: sodium 113.  Ct scan head no obvious cva.  I suspect confusion is from hyponatremia  Final Clinical Impression(s) / ED Diagnoses Final diagnoses:  Mental confusion  Hyponatremia    Rx / DC Orders ED Discharge Orders    None       Sidney Ace 07/12/20 2249    Pattricia Boss, MD 07/12/20 [phone]

## 2020-07-13 DIAGNOSIS — M549 Dorsalgia, unspecified: Secondary | ICD-10-CM

## 2020-07-13 LAB — CBC
HCT: 29 % — ABNORMAL LOW (ref 36.0–46.0)
Hemoglobin: 10.7 g/dL — ABNORMAL LOW (ref 12.0–15.0)
MCH: 33.2 pg (ref 26.0–34.0)
MCHC: 36.9 g/dL — ABNORMAL HIGH (ref 30.0–36.0)
MCV: 90.1 fL (ref 80.0–100.0)
Platelets: 323 10*3/uL (ref 150–400)
RBC: 3.22 MIL/uL — ABNORMAL LOW (ref 3.87–5.11)
RDW: 11.4 % — ABNORMAL LOW (ref 11.5–15.5)
WBC: 3.1 10*3/uL — ABNORMAL LOW (ref 4.0–10.5)
nRBC: 0 % (ref 0.0–0.2)

## 2020-07-13 LAB — BASIC METABOLIC PANEL
Anion gap: 11 (ref 5–15)
Anion gap: 8 (ref 5–15)
Anion gap: 8 (ref 5–15)
Anion gap: 8 (ref 5–15)
Anion gap: 9 (ref 5–15)
BUN: 11 mg/dL (ref 8–23)
BUN: 8 mg/dL (ref 8–23)
BUN: 7 mg/dL — ABNORMAL LOW (ref 8–23)
BUN: 10 mg/dL (ref 8–23)
BUN: 10 mg/dL (ref 8–23)
CO2: 25 mmol/L (ref 22–32)
CO2: 27 mmol/L (ref 22–32)
CO2: 26 mmol/L (ref 22–32)
CO2: 24 mmol/L (ref 22–32)
CO2: 23 mmol/L (ref 22–32)
Calcium: 9.4 mg/dL (ref 8.9–10.3)
Calcium: 9 mg/dL (ref 8.9–10.3)
Calcium: 9.2 mg/dL (ref 8.9–10.3)
Calcium: 9.6 mg/dL (ref 8.9–10.3)
Calcium: 9.5 mg/dL (ref 8.9–10.3)
Chloride: 81 mmol/L — ABNORMAL LOW (ref 98–111)
Chloride: 87 mmol/L — ABNORMAL LOW (ref 98–111)
Chloride: 91 mmol/L — ABNORMAL LOW (ref 98–111)
Chloride: 93 mmol/L — ABNORMAL LOW (ref 98–111)
Chloride: 97 mmol/L — ABNORMAL LOW (ref 98–111)
Creatinine, Ser: 0.62 mg/dL (ref 0.44–1.00)
Creatinine, Ser: 0.6 mg/dL (ref 0.44–1.00)
Creatinine, Ser: 0.62 mg/dL (ref 0.44–1.00)
Creatinine, Ser: 0.68 mg/dL (ref 0.44–1.00)
Creatinine, Ser: 0.84 mg/dL (ref 0.44–1.00)
GFR, Estimated: >60 mL/min (ref >60)
GFR, Estimated: >60 mL/min (ref >60)
GFR, Estimated: >60 mL/min (ref >60)
GFR, Estimated: >60 mL/min (ref >60)
GFR, Estimated: >60 mL/min (ref >60)
Glucose, Bld: 105 mg/dL — ABNORMAL HIGH (ref 70–99)
Glucose, Bld: 89 mg/dL (ref 70–99)
Glucose, Bld: 108 mg/dL — ABNORMAL HIGH (ref 70–99)
Glucose, Bld: 87 mg/dL (ref 70–99)
Glucose, Bld: 96 mg/dL (ref 70–99)
Potassium: 3 mmol/L — ABNORMAL LOW (ref 3.5–5.1)
Potassium: 3.4 mmol/L — ABNORMAL LOW (ref 3.5–5.1)
Potassium: 3.3 mmol/L — ABNORMAL LOW (ref 3.5–5.1)
Potassium: 3.9 mmol/L (ref 3.5–5.1)
Potassium: 4.3 mmol/L (ref 3.5–5.1)
Sodium: 117 mmol/L — CL (ref 135–145)
Sodium: 122 mmol/L — ABNORMAL LOW (ref 135–145)
Sodium: 125 mmol/L — ABNORMAL LOW (ref 135–145)
Sodium: 125 mmol/L — ABNORMAL LOW (ref 135–145)
Sodium: 129 mmol/L — ABNORMAL LOW (ref 135–145)

## 2020-07-13 LAB — VITAMIN B12: Vitamin B-12: 3271 pg/mL — ABNORMAL HIGH (ref 180–914)

## 2020-07-13 LAB — CORTISOL: Cortisol, Plasma: 10.2 ug/dL

## 2020-07-13 LAB — RESP PANEL BY RT-PCR (FLU A&B, COVID) ARPGX2
Influenza A by PCR: NEGATIVE
Influenza B by PCR: NEGATIVE
SARS Coronavirus 2 by RT PCR: NEGATIVE

## 2020-07-13 LAB — OSMOLALITY, URINE
Osmolality, Ur: 327 mOsm/kg (ref 300–900)
Osmolality, Ur: 79 mOsm/kg — ABNORMAL LOW (ref 300–900)

## 2020-07-13 LAB — CREATININE, URINE, RANDOM: Creatinine, Urine: 12.24 mg/dL

## 2020-07-13 LAB — GLUCOSE, CAPILLARY: Glucose-Capillary: 95 mg/dL (ref 70–99)

## 2020-07-13 LAB — SODIUM, URINE, RANDOM: Sodium, Ur: 33 mmol/L

## 2020-07-13 LAB — OSMOLALITY: Osmolality: 251 mOsm/kg — ABNORMAL LOW (ref 275–295)

## 2020-07-13 LAB — HIV ANTIBODY (ROUTINE TESTING W REFLEX): HIV Screen 4th Generation wRfx: NONREACTIVE

## 2020-07-13 LAB — TSH: TSH: 0.706 u[IU]/mL (ref 0.350–4.500)

## 2020-07-13 LAB — SODIUM: Sodium: 132 mmol/L — ABNORMAL LOW (ref 135–145)

## 2020-07-13 LAB — MRSA PCR SCREENING: MRSA by PCR: POSITIVE — AB

## 2020-07-13 MED ORDER — DOCUSATE SODIUM 100 MG PO CAPS: 100.0000 mg | ORAL_CAPSULE | Freq: Two times a day (BID) | ORAL | Status: DC | PRN

## 2020-07-13 MED ORDER — DICYCLOMINE HCL 20 MG PO TABS
20.0000 mg | ORAL_TABLET | Freq: Three times a day (TID) | ORAL | Status: DC
  Administered 2020-07-13 – 2020-07-14 (×5): 20 mg via ORAL
  Filled 2020-07-13 (×8): qty 1

## 2020-07-13 MED ORDER — HYDROCODONE-ACETAMINOPHEN 5-325 MG PO TABS
1.5000 | ORAL_TABLET | ORAL | Status: DC | PRN
  Administered 2020-07-13 – 2020-07-14 (×7): 1.5 via ORAL
  Filled 2020-07-13 (×7): qty 2

## 2020-07-13 MED ORDER — VITAMIN E 45 MG (100 UNIT) PO CAPS
1000.0000 [IU] | ORAL_CAPSULE | Freq: Every day | ORAL | Status: DC
  Filled 2020-07-13 (×2): qty 10

## 2020-07-13 MED ORDER — VITAMIN D 25 MCG (1000 UNIT) PO TABS
2000.0000 [IU] | ORAL_TABLET | Freq: Every day | ORAL | Status: DC
  Administered 2020-07-13 – 2020-07-14 (×2): 2000 [IU] via ORAL
  Filled 2020-07-13 (×2): qty 2

## 2020-07-13 MED ORDER — LINACLOTIDE 145 MCG PO CAPS
145.0000 ug | ORAL_CAPSULE | Freq: Every day | ORAL | Status: DC
  Administered 2020-07-13 – 2020-07-14 (×2): 145 ug via ORAL
  Filled 2020-07-13 (×4): qty 1

## 2020-07-13 MED ORDER — POTASSIUM CHLORIDE CRYS ER 20 MEQ PO TBCR
40.0000 meq | EXTENDED_RELEASE_TABLET | Freq: Once | ORAL | Status: AC
  Administered 2020-07-13: 40 meq via ORAL
  Filled 2020-07-13: qty 2

## 2020-07-13 MED ORDER — ZOLPIDEM TARTRATE 5 MG PO TABS
5.0000 mg | ORAL_TABLET | Freq: Once | ORAL | Status: AC
  Administered 2020-07-13: 5 mg via ORAL
  Filled 2020-07-13: qty 1

## 2020-07-13 MED ORDER — CYCLOBENZAPRINE HCL 10 MG PO TABS
5.0000 mg | ORAL_TABLET | Freq: Once | ORAL | Status: AC
  Administered 2020-07-13: 5 mg via ORAL
  Filled 2020-07-13: qty 1

## 2020-07-13 MED ORDER — PANTOPRAZOLE SODIUM 40 MG PO TBEC
40.0000 mg | DELAYED_RELEASE_TABLET | Freq: Every day | ORAL | Status: DC
  Administered 2020-07-13 – 2020-07-14 (×2): 40 mg via ORAL
  Filled 2020-07-13 (×2): qty 1

## 2020-07-13 MED ORDER — MUPIROCIN 2 % EX OINT
1.0000 "application " | TOPICAL_OINTMENT | Freq: Two times a day (BID) | CUTANEOUS | Status: DC
  Administered 2020-07-13 – 2020-07-14 (×3): 1 via NASAL
  Filled 2020-07-13 (×3): qty 22

## 2020-07-13 MED ORDER — FAMOTIDINE 20 MG PO TABS
20.0000 mg | ORAL_TABLET | Freq: Once | ORAL | Status: AC
  Administered 2020-07-13: 20 mg via ORAL
  Filled 2020-07-13: qty 1

## 2020-07-13 MED ORDER — CHLORHEXIDINE GLUCONATE CLOTH 2 % EX PADS
6.0000 | MEDICATED_PAD | Freq: Every day | CUTANEOUS | Status: DC
  Administered 2020-07-13 – 2020-07-14 (×2): 6 via TOPICAL

## 2020-07-13 MED ORDER — ONDANSETRON HCL 4 MG/2ML IJ SOLN: 4.0000 mg | Freq: Four times a day (QID) | INTRAMUSCULAR | Status: DC | PRN

## 2020-07-13 MED ORDER — AMLODIPINE BESYLATE 10 MG PO TABS
10.0000 mg | ORAL_TABLET | Freq: Every day | ORAL | Status: DC
  Administered 2020-07-13 – 2020-07-14 (×2): 10 mg via ORAL
  Filled 2020-07-13 (×3): qty 1

## 2020-07-13 MED ORDER — POLYETHYLENE GLYCOL 3350 17 G PO PACK: 17.0000 g | PACK | Freq: Every day | ORAL | Status: DC | PRN

## 2020-07-13 MED ORDER — METOPROLOL SUCCINATE ER 25 MG PO TB24
25.0000 mg | ORAL_TABLET | Freq: Every morning | ORAL | Status: DC
  Administered 2020-07-13 – 2020-07-14 (×2): 25 mg via ORAL
  Filled 2020-07-13 (×2): qty 1

## 2020-07-13 MED ORDER — SODIUM CHLORIDE 3 % IV SOLN
INTRAVENOUS | Status: DC
  Filled 2020-07-13 (×2): qty 500

## 2020-07-13 MED ORDER — CALCIUM CARBONATE ANTACID 500 MG PO CHEW
1.0000 | CHEWABLE_TABLET | ORAL | Status: DC | PRN
  Administered 2020-07-13 – 2020-07-14 (×3): 200 mg via ORAL
  Filled 2020-07-13 (×3): qty 1

## 2020-07-13 MED ORDER — POTASSIUM CHLORIDE 10 MEQ/100ML IV SOLN
10.0000 meq | INTRAVENOUS | Status: AC
  Administered 2020-07-13 (×2): 10 meq via INTRAVENOUS
  Filled 2020-07-13 (×2): qty 100

## 2020-07-13 MED ORDER — BUPROPION HCL ER (XL) 150 MG PO TB24
150.0000 mg | ORAL_TABLET | Freq: Every morning | ORAL | Status: DC
  Administered 2020-07-13 – 2020-07-14 (×2): 150 mg via ORAL
  Filled 2020-07-13 (×2): qty 1

## 2020-07-13 MED ORDER — POTASSIUM CHLORIDE 20 MEQ PO PACK
40.0000 meq | PACK | Freq: Once | ORAL | Status: DC
  Filled 2020-07-13: qty 2

## 2020-07-13 NOTE — Plan of Care (Signed)
  Problem: Activity: Goal: Risk for activity intolerance will decrease Outcome: Completed/Met

## 2020-07-13 NOTE — Progress Notes (Signed)
[facility] Progress Note Patient Name: JENESSA GILLINGHAM DOB: 01/20/1956 MRN: [id number]   Date of Service  07/13/2020  HPI/Events of Note  Hypokalemia - K+ = 3.4 and Creatinine = 0.6.   eICU Interventions  Will replace K+.     Intervention Category Major Interventions: Electrolyte abnormality - evaluation and management  Kjirsten Bloodgood [place] 07/13/2020, 6:54 AM

## 2020-07-13 NOTE — ED Notes (Signed)
Attempted to give reportx1 

## 2020-07-13 NOTE — H&P (Signed)
History and Physical    Adrienne Robinson:[id number] DOB: [birth year] DOA: 07/12/2020  PCP: Adrienne Mc, MD  Patient coming from: Home  I have personally briefly reviewed patient's old medical records in [place]  Chief Complaint: Neck and back pain  HPI: Adrienne Robinson is a 65 y.o. female with medical history significant for AVM of the brain s/p embolization in 2014, IBS, esophagitis, hypertension, CKD stage III, DJD, hyperlipidemia anemia who presents with concerns of worsening neck and back pain.  Patient reports that she has chronic back pain without any previous history of surgery.  She said it has gotten acutely worse in the past 6 months due to increasing stress.  Her husband has more health problems and recently was placed on peritoneal dialysis.  States she has been using her hydrocodone at home more often about every 4-6 hours.  Also has felt some numbness and tingling of her hand.  She has been eating less due to lack of appetite and stress.  Denies any alcohol use.  ED Course: Patient appears to need frequent redirection during questioning for HPI.  She initially told me the year was 98 and then changed to 2021.  Her churches pastor's wife was at bedside but she initially told me that it was her sister-in-law.  She was found to be in severe hyponatremia with sodium of 113.  Her prior sodium about 5 months ago was 129. Potassium of 2.9.  Glucose of 104.  Review of Systems:  Constitutional: No Weight Change, No Fever ENT/Mouth: No sore throat, No Rhinorrhea Eyes: No Eye Pain, No Vision Changes Cardiovascular: No Chest Pain, no SOB, No PND, No Dyspnea on Exertion, No Orthopnea, No Claudication, No Edema, No Palpitations Respiratory: No Cough, No Sputum, No Wheezing, no Dyspnea  Gastrointestinal: No Nausea, No Vomiting, No Diarrhea, No Constipation, No Pain Genitourinary: no Urinary Incontinence, No Urgency, No Flank Pain Musculoskeletal: No Arthralgias, No  Myalgias Skin: No Skin Lesions, No Pruritus, Neuro: no Weakness, No Numbness,  No Loss of Consciousness, No Syncope Psych: No Anxiety/Panic, No Depression, no decrease appetite Heme/Lymph: No Bruising, No Bleeding  Past Medical History:  Diagnosis Date  . Allergy   . Arthritis   . 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 B12 deficiency 01/2016  . Candida esophagitis ([place])   . Degenerative joint disease involving multiple joints   . Depression   . Dizziness   . Hypertension   . Pneumonia    approx 6 years ago  . Scoliosis   . Spinal stenosis of lumbar region   . Squamous cell carcinoma of skin 06/24/2018   R lateral calf  . Tubular adenoma of colon     Past Surgical History:  Procedure Laterality Date  . ABDOMINAL HYSTERECTOMY    . APPENDECTOMY    . CARDIAC CATHETERIZATION     normal coronaries, no wall motion abnormalities 11/02/09 [facility])  . NASAL SINUS SURGERY    . OOPHORECTOMY    . RADIOLOGY WITH ANESTHESIA  02/26/2012   Procedure: RADIOLOGY WITH ANESTHESIA;  Surgeon: Adrienne Hickman, MD;  Location: [facility];  Service: Radiology;  Laterality: N/A;  . REVERSE SHOULDER ARTHROPLASTY Right 06/27/2013   Procedure: REVERSE RIGHT TOTAL SHOULDER ARTHROPLASTY;  Surgeon: Adrienne Schooling, MD;  Location: [facility];  Service: Orthopedics;  Laterality: Right;  . SHOULDER ARTHROSCOPY WITH ROTATOR CUFF REPAIR AND SUBACROMIAL DECOMPRESSION  2007   left  . TONSILLECTOMY  adnoids     reports that she has never smoked. She has never used smokeless tobacco. She reports current alcohol use of about 2.0 standard drinks of alcohol per week. She reports that she does not use drugs. Social History  Allergies  Allergen Reactions  . Adhesive [Tape] Rash and Other (See Comments)    Redness, blisters, skin peeling off.  . Morphine And Related Rash    Family History  Problem Relation Age of Onset  . Colon cancer Father 51  . Diabetes Father   .  Heart disease Father   . Rectal cancer Father   . Thyroid disease Mother   . Colon cancer Paternal Grandfather   . Colon cancer Other        pggm  . Breast cancer Paternal Grandmother   . Stomach cancer Neg Hx      Prior to Admission medications   Medication Sig Start Date End Date Taking? Authorizing Provider  amLODipine (NORVASC) 10 MG tablet TAKE 1 TABLET BY MOUTH EVERY DAY Patient taking differently: Take 10 mg by mouth daily. TAKE 1 TABLET BY MOUTH EVERY DAY 06/08/20  Yes Adrienne Mc, MD  buPROPion (WELLBUTRIN XL) 150 MG 24 hr tablet TAKE 3 TABLETS (450 MG TOTAL) BY MOUTH DAILY. Patient taking differently: Take 150 mg by mouth every morning. 09/01/19  Yes Adrienne Mc, MD  Cholecalciferol (VITAMIN D3) 50 MCG (2000 UT) CAPS Take 1 capsule by mouth daily.   Yes [provider]  cyanocobalamin (,VITAMIN B-12,) 1000 MCG/ML injection INJECT 1 ML (1,000 MCG TOTAL) INTO THE MUSCLE ONCE A WEEK. Patient taking differently: Inject 1,000 mcg into the muscle once a week. Usually sundays 09/17/19  Yes Adrienne Mc, MD  dicyclomine (BENTYL) 20 MG tablet TAKE 1 TABLET (20 MG TOTAL) BY MOUTH 3 (THREE) TIMES DAILY BEFORE MEALS. 04/23/20  Yes Adrienne, Lajuan Lines, MD  esomeprazole (NEXIUM) 20 MG capsule Take 20 mg by mouth daily at 12 noon.   Yes [provider]  fluticasone (FLONASE) 50 MCG/ACT nasal spray USE 2 SPRAYS IN EACH NOSTRIL DAILY AS NEEDED Patient taking differently: Place 2 sprays into both nostrils daily as needed for allergies or rhinitis. USE 2 SPRAYS IN EACH NOSTRIL DAILY AS NEEDED 05/29/18  Yes Adrienne Mc, MD  HYDROcodone-acetaminophen (NORCO) 10-325 MG tablet Take 1 tablet by mouth every 6 (six) hours as needed for severe pain. 07/06/20 08/05/20 Yes Adrienne Mc, MD  linaclotide Rolan Lipa) 145 MCG CAPS capsule Take 1 capsule (145 mcg total) by mouth daily before breakfast. NEEDS APPT FOR FURTHER REFILLS 06/08/20  Yes Adrienne, Lajuan Lines, MD  metoprolol succinate  (TOPROL-XL) 25 MG 24 hr tablet TAKE 1 TABLET BY MOUTH EVERY DAY Patient taking differently: Take 25 mg by mouth every morning. 06/08/20  Yes Adrienne Mc, MD  Multiple Vitamin (MULTIVITAMIN) tablet Take 1 tablet by mouth daily.   Yes [provider]  Syringe/Needle, Disp, (SYRINGE 3CC/25GX1") 25G X 1" 3 ML MISC Use for b12 injections 08/21/18  Yes Adrienne Mc, MD  vitamin E 1000 UNIT capsule Take 1,000 Units by mouth daily.   Yes [provider]  zolpidem (AMBIEN) 10 MG tablet TAKE 1 TABLET (10 MG TOTAL) BY MOUTH AT BEDTIME AS NEEDED. FOR SLEEP 07/12/20  Yes Adrienne Mc, MD  ferrous sulfate 325 (65 FE) MG EC tablet Take 1 tablet (325 mg total) by mouth daily after breakfast. 01/06/11 05/04/11  Adrienne Mc, MD  metFORMIN (GLUCOPHAGE) 500 MG tablet Take  500 mg by mouth 2 (two) times daily with a meal.    05/04/11  [provider]    Physical Exam: Vitals:   07/12/20 2215 07/12/20 2230 07/12/20 2330 07/13/20 0115  BP: (!) 142/85 137/84 (!) 145/91 137/86  Pulse: 73 75 70 70  Resp: 11 16 (!) 22 12  Temp:      TempSrc:      SpO2: 99% 98% 98% 100%  Weight:      Height:        Constitutional: NAD, calm, comfortable, elderly female sitting upright in bed Vitals:   07/12/20 2215 07/12/20 2230 07/12/20 2330 07/13/20 0115  BP: (!) 142/85 137/84 (!) 145/91 137/86  Pulse: 73 75 70 70  Resp: 11 16 (!) 22 12  Temp:      TempSrc:      SpO2: 99% 98% 98% 100%  Weight:      Height:       Eyes: PERRL, lids and conjunctivae normal ENMT: Mucous membranes are moist. Neck: normal, supple Respiratory: clear to auscultation bilaterally, no wheezing, no crackles. Normal respiratory effort. No accessory muscle use.  Cardiovascular: Regular rate and rhythm, no murmurs / rubs / gallops. No extremity edema. 2+ pedal pulses. No carotid bruits.  Abdomen: no tenderness, no masses palpated.  Bowel sounds positive.  Musculoskeletal: no clubbing / cyanosis. No joint deformity  upper and lower extremities. Good ROM, no contractures. Normal muscle tone.  Skin: no rashes, lesions, ulcers. No induration Neurologic: CN 2-12 grossly intact. Sensation intact,  Strength 5/5 in all 4.pt required frequent redirecting in order to answer questions appropriately.  She frequently just referred back to how her husband is sick.  Initially told me the year was 1992 but later changed it to the correct time.  Initially mistaken her friend at bedside for family member. Psychiatric: Disoriented at times. Alert and oriented x 3. Normal mood.     Labs on Admission: I have personally reviewed following labs and imaging studies  CBC: Recent Labs  Lab 07/12/20 1939  WBC 3.9*  HGB 11.5*  HCT 30.7*  MCV 89.8  PLT 035   Basic Metabolic Panel: Recent Labs  Lab 07/12/20 1939  NA 113*  K 2.9*  CL 78*  CO2 24  GLUCOSE 104*  BUN 14  CREATININE 0.75  CALCIUM 8.9   GFR: Estimated Creatinine Clearance: 58.5 mL/min (by C-G formula based on SCr of 0.75 mg/dL). Liver Function Tests: Recent Labs  Lab 07/12/20 2236  AST 27  ALT 19  ALKPHOS 71  BILITOT 1.1  PROT 7.2  ALBUMIN 4.2   No results for input(s): LIPASE, AMYLASE in the last 168 hours. No results for input(s): AMMONIA in the last 168 hours. Coagulation Profile: Recent Labs  Lab 07/12/20 1939  INR 1.0   Cardiac Enzymes: No results for input(s): CKTOTAL, CKMB, CKMBINDEX, TROPONINI in the last 168 hours. BNP (last 3 results) No results for input(s): PROBNP in the last 8760 hours. HbA1C: No results for input(s): HGBA1C in the last 72 hours. CBG: No results for input(s): GLUCAP in the last 168 hours. Lipid Profile: No results for input(s): CHOL, HDL, LDLCALC, TRIG, CHOLHDL, LDLDIRECT in the last 72 hours. Thyroid Function Tests: No results for input(s): TSH, T4TOTAL, FREET4, T3FREE, THYROIDAB in the last 72 hours. Anemia Panel: No results for input(s): VITAMINB12, FOLATE, FERRITIN, TIBC, IRON, RETICCTPCT in the  last 72 hours. Urine analysis:    Component Value Date/Time   COLORURINE STRAW (A) 07/12/2020 2027   APPEARANCEUR  CLEAR 07/12/2020 2027   APPEARANCEUR Clear 09/14/2011 1624   LABSPEC 1.010 07/12/2020 2027   LABSPEC 1.008 09/14/2011 1624   PHURINE 6.0 07/12/2020 2027   GLUCOSEU NEGATIVE 07/12/2020 2027   GLUCOSEU Negative 09/14/2011 1624   HGBUR NEGATIVE 07/12/2020 2027   BILIRUBINUR NEGATIVE 07/12/2020 2027   BILIRUBINUR Negative 09/14/2011 1624   KETONESUR 20 (A) 07/12/2020 2027   PROTEINUR NEGATIVE 07/12/2020 2027   NITRITE NEGATIVE 07/12/2020 2027   LEUKOCYTESUR TRACE (A) 07/12/2020 2027   LEUKOCYTESUR Negative 09/14/2011 1624    Radiological Exams on Admission: CT Head Wo Contrast  Result Date: 07/12/2020 CLINICAL DATA:  Mental status change.  Hypertension. EXAM: CT HEAD WITHOUT CONTRAST TECHNIQUE: Contiguous axial images were obtained from the base of the skull through the vertex without intravenous contrast. COMPARISON:  Head CT 02/29/2020 FINDINGS: Brain: High-density material in the parafalcine right parietal lobe is unchanged from prior exam, consistent with treated AVM. No associated or other intracranial hemorrhage. No midline shift or mass effect. No evidence of acute ischemia. No hydrocephalus. No subdural or extra-axial collection. Vascular: Hyperdense vessel. Skull: No fracture or focal lesion. Sinuses/Orbits: Trace mucosal thickening of left maxillary sinus and left side of sphenoid sinus. Mastoid air cells are clear. No orbital abnormality. Other: None. IMPRESSION: 1. No acute intracranial abnormality. 2. Unchanged hyperdense material in the parafalcine right parietal lobe, consistent with treated AVM. Electronically Signed   By: Keith Rake M.D.   On: 07/12/2020 20:54   DG Chest Port 1 View  Result Date: 07/12/2020 CLINICAL DATA:  Hypertension EXAM: PORTABLE CHEST 1 VIEW COMPARISON:  01/25/2018 FINDINGS: Lungs are well expanded, symmetric, and clear. No  pneumothorax or pleural effusion. Cardiac size within normal limits. Pulmonary vascularity is normal. Osseous structures are age-appropriate. Multiple healed right rib fractures are again noted. Right total shoulder arthroplasty has been performed. No acute bone abnormality. IMPRESSION: No active disease. Electronically Signed   By: Fidela Salisbury MD   On: 07/12/2020 22:04      Assessment/Plan  Severe symptomatic hyponatremia  -Likely from hypovolemia bulimic hyponatremia due to decreased PO intake in the last several months from increasing family stress -Patient will need 3% hypertonic saline unfortunately this can only be ordered by our critical care team -I have consulted Dr. Marchelle Gearing who will assume care of this patient -Serum and urine osmolality, spot sodium labs ordered  Acute on chronic cervical/lumbar back pain -Patient had recent CT cervical spine on 02/2020 with no acute findings -Suspect her worsening pain is due to increased stress -Trial of Flexeril and recommend imaging only if symptoms are not relieved    Level of care: Need ICU care due to need for hypertonic saline  Status is: Inpatient  Remains inpatient appropriate because:Inpatient level of care appropriate due to severity of illness   Dispo: The patient is from: Home              Anticipated d/c is to: Home              Patient currently is not medically stable to d/c.   Difficult to place patient No         Orene Desanctis DO [facility]   If 7PM-7AM, please contact night-coverage [email or web address]   07/13/2020, 1:28 AM

## 2020-07-13 NOTE — ED Notes (Signed)
Critical Sodium 117, primary RN Hennie Duos to be made aware

## 2020-07-13 NOTE — Progress Notes (Signed)
NAME:  Adrienne Robinson, MRN:  [id number], DOB:  Mar 21, 1955, LOS: 1 ADMISSION DATE:  07/12/2020, CONSULTATION DATE:  07/13/20 REFERRING MD:  Adrienne Robinson, CHIEF COMPLAINT:  Back pain, arm tingling  History of Present Illness:  Adrienne Robinson is a 65 year old woman with a  history of HTN, here with multiple complaints, found to have hyponatremia (113), with mild intermittent altered mental status.  Experiencing back pain (muscular in nature, lower back, acute on chronic).  Takes norco for this at home.   Also concerned about B "tingling" sensation in distal upper extremities and distal lower extremities.  Experienced something similar in past while starting wellbutrin at a higher dose.  Numbness starts at elbows extends down.  Sensation is waxing and waning.   Also having intermittent epigastric pain.  This is chronic and recurring.    Poor po intake for past one to two weeks.  Has been stressed about husband starting peritoneal dialysis.    Pertinent  Medical History  HTN Chronic back and neck pain IBS  Significant Hospital Events: Including procedures, antibiotic start and stop dates in addition to other pertinent events   . 5/24 admitted and required hypertonic saline   Interim History / Subjective:   Overnight, sodium improved and hypertonic saline stopped. Alert and oriented this morning, states she overall feels better. Complaining of chronic back pain and indigestion. States she got one hour of sleep this morning and feels much better.  Objective   Blood pressure 119/68, pulse 71, temperature (!) 97.2 F (36.2 C), temperature source Axillary, resp. rate 13, height 5\' 6"  (1.676 m), weight 55.9 kg, SpO2 97 %.        Intake/Output Summary (Last 24 hours) at 07/13/2020 0757 Last data filed at 07/13/2020 0600 Gross per 24 hour  Intake 299.19 ml  Output --  Net 299.19 ml   Filed Weights   07/12/20 1907 07/13/20 0400  Weight: 52.2 kg 55.9 kg    Examination: General: no acute distress,  resting comfortably in bed HENT: NCAT Lungs: clear to auscultation bilaterally, normal work of breathing Cardiovascular: RRR, no murmrus rubs or gallps Abdomen: soft, nontender, bowel sounds present Extremities: No edema, lesions/eschar healing on anterior LE (from derm procedure) Neuro: Sensation intact, alert and oriented x3 Skin: warm and dry  Labs/imaging that I havepersonally reviewed  (right click and "Reselect all SmartList Selections" daily)  Cbc, bmp  Resolved Hospital Problem list     Assessment & Plan:   Severe hyponatremia - Na 113 on admission, likely due to poor oral intake - Due to severity and wavering mentation, admitted to ICU for 3% hypertonic saline - Na improved to 122 this morning, 3% hypertonic saline stopped - BMP q4h, adjust IVF as needed - Cortisol and TSH wnl - Urine studies pending   Hypokalemia - repleting   B tingling - Vitamin B12 elevated   - Patient will need to hold off on weekly vit b12 injections   HTN - cont home medications  IBS - cont home meds  Epigastric pain - c/w refulx - Troponin wnl - Continue pepcid  Best practice (right click and "Reselect all SmartList Selections" daily)  Diet:  Oral Pain/Anxiety/Delirium protocol (if indicated): No VAP protocol (if indicated): Not indicated DVT prophylaxis: LMWH GI prophylaxis: PPI Glucose control:  SSI No Central venous access:  N/A Arterial line:  N/A Foley:  N/A Mobility:  OOB  PT consulted: N/A Last date of multidisciplinary goals of care discussion []  Code Status:  full code  Disposition: ICU  Labs   CBC: Recent Labs  Lab 07/12/20 1939 07/13/20 0542  WBC 3.9* 3.1*  HGB 11.5* 10.7*  HCT 30.7* 29.0*  MCV 89.8 90.1  PLT 306 161    Basic Metabolic Panel: Recent Labs  Lab 07/12/20 1939 07/13/20 0051 07/13/20 0542  NA 113* 117* 122*  K 2.9* 3.0* 3.4*  CL 78* 81* 87*  CO2 24 25 27   GLUCOSE 104* 105* 89  BUN 14 11 8   CREATININE 0.75 0.62 0.60  CALCIUM  8.9 9.4 9.0   GFR: Estimated Creatinine Clearance: 62.7 mL/min (by C-G formula based on SCr of 0.6 mg/dL). Recent Labs  Lab 07/12/20 1939 07/13/20 0542  WBC 3.9* 3.1*    Liver Function Tests: Recent Labs  Lab 07/12/20 2236  AST 27  ALT 19  ALKPHOS 71  BILITOT 1.1  PROT 7.2  ALBUMIN 4.2   No results for input(s): LIPASE, AMYLASE in the last 168 hours. No results for input(s): AMMONIA in the last 168 hours.  ABG    Component Value Date/Time   TCO2 25 04/13/2012 1739     Coagulation Profile: Recent Labs  Lab 07/12/20 1939  INR 1.0    Cardiac Enzymes: No results for input(s): CKTOTAL, CKMB, CKMBINDEX, TROPONINI in the last 168 hours.  HbA1C: Hgb A1c MFr Bld  Date/Time Value Ref Range Status  08/04/2011 08:31 AM 5.7 4.6 - 6.5 % Final    Comment:    Glycemic Control Guidelines for People with Diabetes:Non Diabetic:  <6%Goal of Therapy: <7%Additional Action Suggested:  >8%     CBG: No results for input(s): GLUCAP in the last 168 hours.  Review of Systems:   Review of Systems  Constitutional: Negative for chills and fever.  HENT: Negative for hearing loss.   Eyes: Positive for blurred vision. Negative for double vision.  Respiratory: Negative for cough, hemoptysis and sputum production.   Cardiovascular: Positive for chest pain. Negative for palpitations and orthopnea.  Gastrointestinal: Positive for heartburn and nausea. Negative for vomiting.  Genitourinary: Negative for dysuria.  Musculoskeletal: Negative for myalgias.  Skin: Negative for rash.  Neurological: Positive for tingling and weakness. Negative for dizziness and headaches.  Endo/Heme/Allergies: Does not bruise/bleed easily.  Psychiatric/Behavioral: Positive for depression.     Past Medical History:  She,  has a past medical history of Allergy, Arthritis, AVM (arteriovenous malformation) brain (Dec 2013), B12 deficiency (01/2016), Candida esophagitis ([place]), Degenerative joint disease involving  multiple joints, Depression, Dizziness, Hypertension, Pneumonia, Scoliosis, Spinal stenosis of lumbar region, Squamous cell carcinoma of skin (06/24/2018), and Tubular adenoma of colon.   Surgical History:   Past Surgical History:  Procedure Laterality Date  . ABDOMINAL HYSTERECTOMY    . APPENDECTOMY    . CARDIAC CATHETERIZATION     normal coronaries, no wall motion abnormalities 11/02/09 [facility])  . NASAL SINUS SURGERY    . OOPHORECTOMY    . RADIOLOGY WITH ANESTHESIA  02/26/2012   Procedure: RADIOLOGY WITH ANESTHESIA;  Surgeon: [name], MD;  Location: [facility];  Service: Radiology;  Laterality: N/A;  . REVERSE SHOULDER ARTHROPLASTY Right 06/27/2013   Procedure: REVERSE RIGHT TOTAL SHOULDER ARTHROPLASTY;  Surgeon: [name], MD;  Location: [facility];  Service: Orthopedics;  Laterality: Right;  . SHOULDER ARTHROSCOPY WITH ROTATOR CUFF REPAIR AND SUBACROMIAL DECOMPRESSION  2007   left  . TONSILLECTOMY  adnoids     Social History:   reports that she has never smoked. She has never used smokeless tobacco. She reports current alcohol use  of about 2.0 standard drinks of alcohol per week. She reports that she does not use drugs.   Family History:  Her family history includes Breast cancer in her paternal grandmother; Colon cancer in her paternal grandfather and another family member; Colon cancer (age of onset: 43) in her father; Diabetes in her father; Heart disease in her father; Rectal cancer in her father; Thyroid disease in her mother. There is no history of Stomach cancer.   Allergies Allergies  Allergen Reactions  . Adhesive [Tape] Rash and Other (See Comments)    Redness, blisters, skin peeling off.  . Morphine And Related Rash     Home Medications  Prior to Admission medications   Medication Sig Start Date End Date Taking? Authorizing Provider  amLODipine (NORVASC) 10 MG tablet TAKE 1 TABLET BY MOUTH EVERY DAY Patient taking differently: Take 10 mg by mouth daily. TAKE 1  TABLET BY MOUTH EVERY DAY 06/08/20  Yes [name], MD  buPROPion (WELLBUTRIN XL) 150 MG 24 hr tablet TAKE 3 TABLETS (450 MG TOTAL) BY MOUTH DAILY. Patient taking differently: Take 150 mg by mouth every morning. 09/01/19  Yes [name], MD  Cholecalciferol (VITAMIN D3) 50 MCG (2000 UT) CAPS Take 1 capsule by mouth daily.   Yes [provider]  cyanocobalamin (,VITAMIN B-12,) 1000 MCG/ML injection INJECT 1 ML (1,000 MCG TOTAL) INTO THE MUSCLE ONCE A WEEK. Patient taking differently: Inject 1,000 mcg into the muscle once a week. Usually sundays 09/17/19  Yes [name], MD  dicyclomine (BENTYL) 20 MG tablet TAKE 1 TABLET (20 MG TOTAL) BY MOUTH 3 (THREE) TIMES DAILY BEFORE MEALS. 04/23/20  Yes Pyrtle, Lajuan Lines, MD  esomeprazole (NEXIUM) 20 MG capsule Take 20 mg by mouth daily at 12 noon.   Yes [provider]  fluticasone (FLONASE) 50 MCG/ACT nasal spray USE 2 SPRAYS IN EACH NOSTRIL DAILY AS NEEDED Patient taking differently: Place 2 sprays into both nostrils daily as needed for allergies or rhinitis. USE 2 SPRAYS IN EACH NOSTRIL DAILY AS NEEDED 05/29/18  Yes [name], MD  HYDROcodone-acetaminophen (NORCO) 10-325 MG tablet Take 1 tablet by mouth every 6 (six) hours as needed for severe pain. 07/06/20 08/05/20 Yes [name], MD  linaclotide Rolan Lipa) 145 MCG CAPS capsule Take 1 capsule (145 mcg total) by mouth daily before breakfast. NEEDS APPT FOR FURTHER REFILLS 06/08/20  Yes Pyrtle, Lajuan Lines, MD  metoprolol succinate (TOPROL-XL) 25 MG 24 hr tablet TAKE 1 TABLET BY MOUTH EVERY DAY Patient taking differently: Take 25 mg by mouth every morning. 06/08/20  Yes [name], MD  Multiple Vitamin (MULTIVITAMIN) tablet Take 1 tablet by mouth daily.   Yes [provider]  Syringe/Needle, Disp, (SYRINGE 3CC/25GX1") 25G X 1" 3 ML MISC Use for b12 injections 08/21/18  Yes [name], MD  vitamin E 1000 UNIT capsule Take 1,000 Units by mouth daily.   Yes [provider]  zolpidem (AMBIEN) 10 MG tablet TAKE 1 TABLET (10 MG TOTAL) BY MOUTH AT BEDTIME AS NEEDED. FOR SLEEP 07/12/20  Yes [name], MD  ferrous sulfate 325 (65 FE) MG EC tablet Take 1 tablet (325 mg total) by mouth daily after breakfast. 01/06/11 05/04/11  [name], MD  metFORMIN (GLUCOPHAGE) 500 MG tablet Take 500 mg by mouth 2 (two) times daily with a meal.    05/04/11  [provider]     Harlow Ohms, DO  PGY-2 IMTS

## 2020-07-13 NOTE — H&P (Addendum)
NAME:  Adrienne Robinson, MRN:  [id number], DOB:  [birth year], LOS: 1 ADMISSION DATE:  07/12/2020, CONSULTATION DATE:  07/13/20 REFERRING MD:  Flossie Buffy, CHIEF COMPLAINT:  Back pain, arm tingling  History of Present Illness:  Adrienne Robinson is a 65 year old woman with a  History of HTN, here with multiple complaints, found to have hyponatremia (113), with mild intermittent altered mental status.  Experiencing back pain (muscular in nature, lower back, acute on chronic).Dewaine Conger norco for this at home.   Also concerned about B "tingling" sensation in distal upper extremities and distal lower extremities.  Experienced something similar in past while starting wellbutrin at  A higher dose.  Numbness starts at elbows extends down.  Sensation is waxing and waning.   Also having intermittent epigastric pain.  This is chronic and recurring.    Poor po intake for past one to two weeks.  Has been stressed about husband starting peritoneal dialysis.   Pertinent  Medical History  HTN Chronic lack and neck pain IBS  Significant Hospital Events: Including procedures, antibiotic start and stop dates in addition to other pertinent events   .   Interim History / Subjective:    Objective   Blood pressure (!) 145/91, pulse 70, temperature 97.7 F (36.5 C), temperature source Oral, resp. rate (!) 22, height 5\' 6"  (1.676 m), weight 52.2 kg, SpO2 98 %.       No intake or output data in the 24 hours ending 07/13/20 0103 Filed Weights   07/12/20 1907  Weight: 52.2 kg    Examination: General: NAD, Pleasant, mm dry HENT: NCAT Lungs:CTAB Cardiovascular: RRR Abdomen: NT, ND, NBS Extremities: No edema, lesions/eschar healing on anterior LE (from derm procedure) Neuro: Sensation intact B,  strenght 5/5 in BUE and BLE, CN 2-12 grossly intact  Alert and oriented for me, however is tangential in answering questions, required redirecting.   Labs/imaging that I havepersonally reviewed  (right click and "Reselect all  SmartList Selections" daily)  CBC, Chem panel, CXR.   Resolved Hospital Problem list     Assessment & Plan:  Hyponatremia: Appears slightly hypovolemic.   Possibly 2/2 low solute intake.  Check cortisol and TSH.   Check Una, Uosm, Ucr.   Given severity and possible AMS, started on 3% hypertonic.   Goal 0.5 meq increase in Na per hour.   Na checks q 2 then q4.  Admit to ICU for 3% saline per protocol.    Hypokalemia: starting KCL.   B tingling:  Checking B12.    HTN: cont home meds.   IBS: cont home meds  Epigastric pain: c/w Reflux. Does not seem consistent with cardiac etiology. No signs of cardiac ischemia on EKG.  Check troponin.   Best practice (right click and "Reselect all SmartList Selections" daily)  Diet:  Oral Pain/Anxiety/Delirium protocol (if indicated): No VAP protocol (if indicated): Not indicated DVT prophylaxis: LMWH GI prophylaxis: PPI Glucose control:  SSI No Central venous access:  N/A Arterial line:  N/A Foley:  N/A Mobility:  OOB  PT consulted: N/A Last date of multidisciplinary goals of care discussion []  Code Status:  full code Disposition: ICU  Labs   CBC: Recent Labs  Lab 07/12/20 1939  WBC 3.9*  HGB 11.5*  HCT 30.7*  MCV 89.8  PLT 063    Basic Metabolic Panel: Recent Labs  Lab 07/12/20 1939  NA 113*  K 2.9*  CL 78*  CO2 24  GLUCOSE 104*  BUN 14  CREATININE  0.75  CALCIUM 8.9   GFR: Estimated Creatinine Clearance: 58.5 mL/min (by C-G formula based on SCr of 0.75 mg/dL). Recent Labs  Lab 07/12/20 1939  WBC 3.9*    Liver Function Tests: Recent Labs  Lab 07/12/20 2236  AST 27  ALT 19  ALKPHOS 71  BILITOT 1.1  PROT 7.2  ALBUMIN 4.2   No results for input(s): LIPASE, AMYLASE in the last 168 hours. No results for input(s): AMMONIA in the last 168 hours.  ABG    Component Value Date/Time   TCO2 25 04/13/2012 1739     Coagulation Profile: Recent Labs  Lab 07/12/20 1939  INR 1.0    Cardiac  Enzymes: No results for input(s): CKTOTAL, CKMB, CKMBINDEX, TROPONINI in the last 168 hours.  HbA1C: Hgb A1c MFr Bld  Date/Time Value Ref Range Status  08/04/2011 08:31 AM 5.7 4.6 - 6.5 % Final    Comment:    Glycemic Control Guidelines for People with Diabetes:Non Diabetic:  <6%Goal of Therapy: <7%Additional Action Suggested:  >8%     CBG: No results for input(s): GLUCAP in the last 168 hours.  Review of Systems:   Review of Systems  Constitutional: Negative for chills and fever.  HENT: Negative for hearing loss.   Eyes: Positive for blurred vision. Negative for double vision.  Respiratory: Negative for cough, hemoptysis and sputum production.   Cardiovascular: Positive for chest pain. Negative for palpitations and orthopnea.  Gastrointestinal: Positive for heartburn and nausea. Negative for vomiting.  Genitourinary: Negative for dysuria.  Musculoskeletal: Negative for myalgias.  Skin: Negative for rash.  Neurological: Positive for tingling and weakness. Negative for dizziness and headaches.  Endo/Heme/Allergies: Does not bruise/bleed easily.  Psychiatric/Behavioral: Positive for depression.     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   Surgical History:   Past Surgical History:  Procedure Laterality Date  . ABDOMINAL HYSTERECTOMY    . APPENDECTOMY    . CARDIAC CATHETERIZATION     normal coronaries, no wall motion abnormalities 11/02/09 [facility])  . NASAL SINUS SURGERY    . OOPHORECTOMY    . RADIOLOGY WITH ANESTHESIA  02/26/2012   Procedure: RADIOLOGY WITH ANESTHESIA;  Surgeon: [name], MD;  Location: [facility];  Service: Radiology;  Laterality: N/A;  . REVERSE SHOULDER ARTHROPLASTY Right 06/27/2013   Procedure: REVERSE RIGHT TOTAL SHOULDER ARTHROPLASTY;  Surgeon: [name], MD;  Location: [facility];  Service: Orthopedics;  Laterality: Right;  . SHOULDER ARTHROSCOPY WITH ROTATOR CUFF REPAIR AND SUBACROMIAL DECOMPRESSION  2007   left  . TONSILLECTOMY  adnoids     Social History:   reports that she has never smoked. She has never used smokeless tobacco. She reports current alcohol use of about 2.0 standard drinks of alcohol per week. She reports that she does not use drugs.   Family History:  Her family history includes Breast cancer in her paternal grandmother; Colon cancer in her paternal grandfather and another family member; Colon cancer (age of onset: 61) in her father; Diabetes in her father; Heart disease in her father; Rectal cancer in her father; Thyroid disease in her mother. There is no history of Stomach cancer.   Allergies Allergies  Allergen Reactions  . Adhesive [Tape] Rash and  Other (See Comments)    Redness, blisters, skin peeling off.  . Morphine And Related Rash     Home Medications  Prior to Admission medications   Medication Sig Start Date End Date Taking? Authorizing Provider  amLODipine (NORVASC) 10 MG tablet TAKE 1 TABLET BY MOUTH EVERY DAY Patient taking differently: Take 10 mg by mouth daily. TAKE 1 TABLET BY MOUTH EVERY DAY 06/08/20  Yes [name], MD  buPROPion (WELLBUTRIN XL) 150 MG 24 hr tablet TAKE 3 TABLETS (450 MG TOTAL) BY MOUTH DAILY. Patient taking differently: Take 150 mg by mouth every morning. 09/01/19  Yes [name], MD  Cholecalciferol (VITAMIN D3) 50 MCG (2000 UT) CAPS Take 1 capsule by mouth daily.   Yes [provider]  cyanocobalamin (,VITAMIN B-12,) 1000 MCG/ML injection INJECT 1 ML (1,000 MCG TOTAL) INTO THE MUSCLE ONCE A WEEK. Patient taking differently: Inject 1,000 mcg into the muscle once a week. Usually sundays 09/17/19   Yes [name], MD  dicyclomine (BENTYL) 20 MG tablet TAKE 1 TABLET (20 MG TOTAL) BY MOUTH 3 (THREE) TIMES DAILY BEFORE MEALS. 04/23/20  Yes Pyrtle, Lajuan Lines, MD  esomeprazole (NEXIUM) 20 MG capsule Take 20 mg by mouth daily at 12 noon.   Yes [provider]  fluticasone (FLONASE) 50 MCG/ACT nasal spray USE 2 SPRAYS IN EACH NOSTRIL DAILY AS NEEDED Patient taking differently: Place 2 sprays into both nostrils daily as needed for allergies or rhinitis. USE 2 SPRAYS IN EACH NOSTRIL DAILY AS NEEDED 05/29/18  Yes [name], MD  HYDROcodone-acetaminophen (NORCO) 10-325 MG tablet Take 1 tablet by mouth every 6 (six) hours as needed for severe pain. 07/06/20 08/05/20 Yes [name], MD  linaclotide Rolan Lipa) 145 MCG CAPS capsule Take 1 capsule (145 mcg total) by mouth daily before breakfast. NEEDS APPT FOR FURTHER REFILLS 06/08/20  Yes Pyrtle, Lajuan Lines, MD  metoprolol succinate (TOPROL-XL) 25 MG 24 hr tablet TAKE 1 TABLET BY MOUTH EVERY DAY Patient taking differently: Take 25 mg by mouth every morning. 06/08/20  Yes [name], MD  Multiple Vitamin (MULTIVITAMIN) tablet Take 1 tablet by mouth daily.   Yes [provider]  Syringe/Needle, Disp, (SYRINGE 3CC/25GX1") 25G X 1" 3 ML MISC Use for b12 injections 08/21/18  Yes [name], MD  vitamin E 1000 UNIT capsule Take 1,000 Units by mouth daily.   Yes [provider]  zolpidem (AMBIEN) 10 MG tablet TAKE 1 TABLET (10 MG TOTAL) BY MOUTH AT BEDTIME AS NEEDED. FOR SLEEP 07/12/20  Yes [name], MD  ferrous sulfate 325 (65 FE) MG EC tablet Take 1 tablet (325 mg total) by mouth daily after breakfast. 01/06/11 05/04/11  [name], MD  metFORMIN (GLUCOPHAGE) 500 MG tablet Take 500 mg by mouth 2 (two) times daily with a meal.    05/04/11  [provider]     Critical care time: 45 minutes

## 2020-07-13 NOTE — Plan of Care (Signed)
?  Problem: Clinical Measurements: ?Goal: Will remain free from infection ?Outcome: Progressing ?  ?

## 2020-07-13 NOTE — Progress Notes (Signed)
This Rn receieved phone call from MD.Gonzales,N. Verbal order to discontinue pt. 3% sodium for Na-122 This Rn will discontinue sodium and continue to monitor pt.

## 2020-07-13 NOTE — Progress Notes (Signed)
[facility] Progress Note Patient Name: JOELEEN WORTLEY DOB: 1955/06/30 MRN: [id number]   Date of Service  07/13/2020  HPI/Events of Note  Patient requests Pepcid for indigestion.   eICU Interventions  Plan: 1. Pepcid 20 mg PO X 1 now.      Intervention Category Major Interventions: Other:  Lysle Dingwall 07/13/2020, 5:35 AM

## 2020-07-13 NOTE — Progress Notes (Signed)
[facility] Progress Note Patient Name: Adrienne Robinson DOB: February 01, 1956 MRN: [id number]   Date of Service  07/13/2020  HPI/Events of Note  Patient requests home Ambien for sleep.   eICU Interventions  Plan: 1. Ambien 5 mg PO X 1.      Intervention Category Major Interventions: Other:  Lysle Dingwall 07/13/2020, 10:02 PM

## 2020-07-14 DIAGNOSIS — E876 Hypokalemia: Secondary | ICD-10-CM

## 2020-07-14 DIAGNOSIS — F39 Unspecified mood [affective] disorder: Secondary | ICD-10-CM

## 2020-07-14 DIAGNOSIS — G8929 Other chronic pain: Secondary | ICD-10-CM

## 2020-07-14 DIAGNOSIS — I10 Essential (primary) hypertension: Secondary | ICD-10-CM

## 2020-07-14 LAB — BASIC METABOLIC PANEL
Anion gap: 7 (ref 5–15)
BUN: 9 mg/dL (ref 8–23)
CO2: 26 mmol/L (ref 22–32)
Calcium: 9.8 mg/dL (ref 8.9–10.3)
Chloride: 100 mmol/L (ref 98–111)
Creatinine, Ser: 0.75 mg/dL (ref 0.44–1.00)
GFR, Estimated: >60 mL/min (ref >60)
Glucose, Bld: 95 mg/dL (ref 70–99)
Potassium: 4.3 mmol/L (ref 3.5–5.1)
Sodium: 133 mmol/L — ABNORMAL LOW (ref 135–145)

## 2020-07-14 NOTE — Progress Notes (Signed)
DISCHARGE NOTE HOME Adrienne Robinson to be discharged to home per MD order. Discussed prescriptions and follow up appointments with the patient. Prescriptions given to patient; medication list explained in detail. Patient verbalized understanding.  Skin clean, dry and intact without evidence of skin break down, no evidence of skin tears noted. IV catheter discontinued intact. Site without signs and symptoms of complications. Dressing and pressure applied. Pt denies pain at the site currently. No complaints noted.  Patient free of lines, drains, and wounds.   An After Visit Summary (AVS) was printed and given to the patient. Patient escorted via wheelchair, and discharged home via private auto.  [place], Zenon Mayo, RN

## 2020-07-14 NOTE — Discharge Summary (Signed)
Physician Discharge Summary  Adrienne Robinson CXK:[id number] DOB: 1955/03/25 DOA: 07/12/2020  PCP: [name], MD  Admit date: 07/12/2020 Discharge date: 07/14/2020  Admitted From: Home Disposition: Home  Recommendations for Outpatient Follow-up:  1. Follow ups as below. 2. Please obtain CBC/BMP/Mag at follow up 3. Please follow up on the following pending results: None  Home Health: None required Equipment/Devices: None required  Discharge Condition: Stable CODE STATUS: Full code   Follow-up Information    [name], MD. Schedule an appointment as soon as possible for a visit in 1 week(s).   Specialty: Internal Medicine Contact information: [address] [place] [address] [phone]                Hospital Course: 65 year old F with PMH of HTN, chronic pain, IBS, presented to ED with multiple complaints including acute on chronic back pain, tingling sensation in her extremities and fatigue, decreased p.o. intake, and admitted with severe hyponatremia to 113.  She was a started on hypertonic saline with improvement in her hyponatremia and her symptoms..  Eventually, she came off hypertonic saline, and sodium remained stable at 133.  Urine sodium was elevated at 33.  TSH and a.m. cortisol was within normal.  The cause of her hyponatremia was not quite clear but felt to be due to hypovolemia from poor p.o. intake.  She also admits to drinking about 4-5 bottles of Diet Coke a day.  She has been advised to cut down on Diet Coke.  Recommend repeat BMP at follow-up in 1 week.  See individual problem list below for more on hospital course.  Discharge Diagnoses:  Severe hyponatremia: SIADH.  Na improved from 113-133 and remained stable after hypertonic saline.  Elevated urine sodium suggestive of SIADH.  -Recheck BMP at follow-up -Encouraged to cut down on Diet Coke  Hypokalemia: Resolved  Bilateral extremity tingling: Likely due to severe  hyponatremia.  Resolved.  B12 within normal.  Essential HTN: Normotensive. -cont home medications  Chronic back pain -Continue home Norco and bowel regimen  Mood disorder/insomnia -Continue home medications  IBS -Continue home Bentyl   Body mass index is 19.89 kg/m.            Discharge Exam: Vitals:   07/14/20 0425 07/14/20 0921  BP: 134/72 108/62  Pulse: 63 73  Resp: 17 18  Temp: 97.6 F (36.4 C) 98.1 F (36.7 C)  SpO2: 100% 97%    GENERAL: No apparent distress.  Nontoxic. HEENT: MMM.  Vision and hearing grossly intact.  NECK: Supple.  No apparent JVD.  RESP: On RA.  No IWOB.  Fair aeration bilaterally. CVS:  RRR. Heart sounds normal.  ABD/GI/GU: Bowel sounds present. Soft. Non tender.  MSK/EXT:  Moves extremities. No apparent deformity. No edema.  SKIN: no apparent skin lesion or wound NEURO: Awake, alert and oriented appropriately.  No apparent focal neuro deficit. PSYCH: Calm. Normal affect.  Discharge Instructions  Discharge Instructions    Call MD for:  extreme fatigue   Complete by: As directed    Diet general   Complete by: As directed    Discharge instructions   Complete by: As directed    It has been a pleasure taking care of you!  You were hospitalized due to generalized weakness, fatigue and tingling in your arms and legs likely from severe hyponatremia (low sodium level).  Your symptoms improved with correction of your hyponatremia.  It is not clear what has caused you hyponatremia but  we recommend cutting down on Diet Coke and not drinking excessive water.   Please follow-up with your primary care doctor in 1 week to have your sodium level rechecked.  Please review your new medication list and the directions on your medications before you take them.   Take care,   Increase activity slowly   Complete by: As directed      Allergies as of 07/14/2020      Reactions   Adhesive [tape] Rash, Other (See Comments)   Redness, blisters,  skin peeling off.   Morphine And Related Rash      Medication List    STOP taking these medications   vitamin E 1000 UNIT capsule     TAKE these medications   amLODipine 10 MG tablet Commonly known as: NORVASC TAKE 1 TABLET BY MOUTH EVERY DAY What changed: additional instructions   buPROPion 150 MG 24 hr tablet Commonly known as: WELLBUTRIN XL TAKE 3 TABLETS (450 MG TOTAL) BY MOUTH DAILY. What changed:   how much to take  when to take this   cyanocobalamin 1000 MCG/ML injection Commonly known as: (VITAMIN B-12) INJECT 1 ML (1,000 MCG TOTAL) INTO THE MUSCLE ONCE A WEEK. What changed: additional instructions   dicyclomine 20 MG tablet Commonly known as: BENTYL TAKE 1 TABLET (20 MG TOTAL) BY MOUTH 3 (THREE) TIMES DAILY BEFORE MEALS.   esomeprazole 20 MG capsule Commonly known as: NEXIUM Take 20 mg by mouth daily at 12 noon.   fluticasone 50 MCG/ACT nasal spray Commonly known as: FLONASE USE 2 SPRAYS IN EACH NOSTRIL DAILY AS NEEDED What changed: See the new instructions.   HYDROcodone-acetaminophen 10-325 MG tablet Commonly known as: NORCO Take 1 tablet by mouth every 6 (six) hours as needed for severe pain.   linaclotide 145 MCG Caps capsule Commonly known as: Linzess Take 1 capsule (145 mcg total) by mouth daily before breakfast. NEEDS APPT FOR FURTHER REFILLS   metoprolol succinate 25 MG 24 hr tablet Commonly known as: TOPROL-XL TAKE 1 TABLET BY MOUTH EVERY DAY What changed: when to take this   multivitamin tablet Take 1 tablet by mouth daily.   SYRINGE 3CC/25GX1" 25G X 1" 3 ML Misc Use for b12 injections   vitamin D3 50 MCG (2000 UT) Caps Take 1 capsule by mouth daily.   zolpidem 10 MG tablet Commonly known as: AMBIEN TAKE 1 TABLET (10 MG TOTAL) BY MOUTH AT BEDTIME AS NEEDED. FOR SLEEP       Consultations:  PCCM  Procedures/Studies:   CT Head Wo Contrast  Result Date: 07/12/2020 CLINICAL DATA:  Mental status change.  Hypertension. EXAM:  CT HEAD WITHOUT CONTRAST TECHNIQUE: Contiguous axial images were obtained from the base of the skull through the vertex without intravenous contrast. COMPARISON:  Head CT 02/29/2020 FINDINGS: Brain: High-density material in the parafalcine right parietal lobe is unchanged from prior exam, consistent with treated AVM. No associated or other intracranial hemorrhage. No midline shift or mass effect. No evidence of acute ischemia. No hydrocephalus. No subdural or extra-axial collection. Vascular: Hyperdense vessel. Skull: No fracture or focal lesion. Sinuses/Orbits: Trace mucosal thickening of left maxillary sinus and left side of sphenoid sinus. Mastoid air cells are clear. No orbital abnormality. Other: None. IMPRESSION: 1. No acute intracranial abnormality. 2. Unchanged hyperdense material in the parafalcine right parietal lobe, consistent with treated AVM. Electronically Signed   By: Keith Rake M.D.   On: 07/12/2020 20:54   DG Chest Port 1 View  Result Date: 07/12/2020 CLINICAL DATA:  Hypertension EXAM: PORTABLE CHEST 1 VIEW COMPARISON:  01/25/2018 FINDINGS: Lungs are well expanded, symmetric, and clear. No pneumothorax or pleural effusion. Cardiac size within normal limits. Pulmonary vascularity is normal. Osseous structures are age-appropriate. Multiple healed right rib fractures are again noted. Right total shoulder arthroplasty has been performed. No acute bone abnormality. IMPRESSION: No active disease. Electronically Signed   By: Fidela Salisbury MD   On: 07/12/2020 22:04        The results of significant diagnostics from this hospitalization (including imaging, microbiology, ancillary and laboratory) are listed below for reference.     Microbiology: Recent Results (from the past 240 hour(s))  Resp Panel by RT-PCR (Flu A&B, Covid) Nasopharyngeal Swab     Status: None   Collection Time: 07/12/20 10:59 PM   Specimen: Nasopharyngeal Swab; Nasopharyngeal(NP) swabs in vial transport medium   Result Value Ref Range Status   SARS Coronavirus 2 by RT PCR NEGATIVE NEGATIVE Final    Comment: (NOTE) SARS-CoV-2 target nucleic acids are NOT DETECTED.  The SARS-CoV-2 RNA is generally detectable in upper respiratory specimens during the acute phase of infection. The lowest concentration of SARS-CoV-2 viral copies this assay can detect is 138 copies/mL. A negative result does not preclude SARS-Cov-2 infection and should not be used as the sole basis for treatment or other patient management decisions. A negative result may occur with  improper specimen collection/handling, submission of specimen other than nasopharyngeal swab, presence of viral mutation(s) within the areas targeted by this assay, and inadequate number of viral copies(<138 copies/mL). A negative result must be combined with clinical observations, patient history, and epidemiological information. The expected result is Negative.  Fact Sheet for Patients:  EntrepreneurPulse.com.au  Fact Sheet for Healthcare Providers:  IncredibleEmployment.be  This test is no t yet approved or cleared by the [place] FDA and  has been authorized for detection and/or diagnosis of SARS-CoV-2 by FDA under an Emergency Use Authorization (EUA). This EUA will remain  in effect (meaning this test can be used) for the duration of the COVID-19 declaration under Section 564(b)(1) of the Act, 21 U.S.C.section 360bbb-3(b)(1), unless the authorization is terminated  or revoked sooner.       Influenza A by PCR NEGATIVE NEGATIVE Final   Influenza B by PCR NEGATIVE NEGATIVE Final    Comment: (NOTE) The Xpert Xpress SARS-CoV-2/FLU/RSV plus assay is intended as an aid in the diagnosis of influenza from Nasopharyngeal swab specimens and should not be used as a sole basis for treatment. Nasal washings and aspirates are unacceptable for Xpert Xpress SARS-CoV-2/FLU/RSV testing.  Fact Sheet for  Patients: EntrepreneurPulse.com.au  Fact Sheet for Healthcare Providers: IncredibleEmployment.be  This test is not yet approved or cleared by the [place] FDA and has been authorized for detection and/or diagnosis of SARS-CoV-2 by FDA under an Emergency Use Authorization (EUA). This EUA will remain in effect (meaning this test can be used) for the duration of the COVID-19 declaration under Section 564(b)(1) of the Act, 21 U.S.C. section 360bbb-3(b)(1), unless the authorization is terminated or revoked.  Performed at [facility], [place] [address], [place], [place] [address]   MRSA PCR Screening     Status: Abnormal   Collection Time: 07/13/20  3:47 AM   Specimen: Nasal Mucosa; Nasopharyngeal  Result Value Ref Range Status   MRSA by PCR POSITIVE (A) NEGATIVE Final    Comment:        The GeneXpert MRSA Assay (FDA approved for NASAL specimens only), is one component of  a comprehensive MRSA colonization surveillance program. It is not intended to diagnose MRSA infection nor to guide or monitor treatment for MRSA infections. RESULT CALLED TO, READ BACK BY AND VERIFIED WITH: P.ALVA, RN 07/13/2020 AT 6759 A.HUGHES Performed at [facility], [place] [address] [place], [place] [address]      Labs:  CBC: Recent Labs  Lab 07/12/20 1939 07/13/20 0542  WBC 3.9* 3.1*  HGB 11.5* 10.7*  HCT 30.7* 29.0*  MCV 89.8 90.1  PLT 306 323   BMP &GFR Recent Labs  Lab 07/13/20 0542 07/13/20 0818 07/13/20 1218 07/13/20 1624 07/13/20 2215 07/14/20 0412  NA 122* 125* 125* 129* 132* 133*  K 3.4* 3.3* 3.9 4.3  --  4.3  CL 87* 91* 93* 97*  --  100  CO2 27 26 24 23   --  26  GLUCOSE 89 108* 87 96  --  95  BUN 8 7* 10 10  --  9  CREATININE 0.60 0.62 0.68 0.84  --  0.75  CALCIUM 9.0 9.2 9.6 9.5  --  9.8   Estimated Creatinine Clearance: 62.7 mL/min (by C-G formula based on SCr of 0.75 mg/dL). Liver & Pancreas: Recent Labs  Lab  07/12/20 2236  AST 27  ALT 19  ALKPHOS 71  BILITOT 1.1  PROT 7.2  ALBUMIN 4.2   No results for input(s): LIPASE, AMYLASE in the last 168 hours. No results for input(s): AMMONIA in the last 168 hours. Diabetic: No results for input(s): HGBA1C in the last 72 hours. Recent Labs  Lab 07/13/20 0336  GLUCAP 95   Cardiac Enzymes: No results for input(s): CKTOTAL, CKMB, CKMBINDEX, TROPONINI in the last 168 hours. No results for input(s): PROBNP in the last 8760 hours. Coagulation Profile: Recent Labs  Lab 07/12/20 1939  INR 1.0   Thyroid Function Tests: Recent Labs    07/13/20 0209  TSH 0.706   Lipid Profile: No results for input(s): CHOL, HDL, LDLCALC, TRIG, CHOLHDL, LDLDIRECT in the last 72 hours. Anemia Panel: Recent Labs    07/13/20 0542  VITAMINB12 3,271*   Urine analysis:    Component Value Date/Time   COLORURINE STRAW (A) 07/12/2020 2027   APPEARANCEUR CLEAR 07/12/2020 2027   APPEARANCEUR Clear 09/14/2011 1624   LABSPEC 1.010 07/12/2020 2027   LABSPEC 1.008 09/14/2011 1624   PHURINE 6.0 07/12/2020 2027   GLUCOSEU NEGATIVE 07/12/2020 2027   GLUCOSEU Negative 09/14/2011 1624   HGBUR NEGATIVE 07/12/2020 2027   BILIRUBINUR NEGATIVE 07/12/2020 2027   BILIRUBINUR Negative 09/14/2011 1624   KETONESUR 20 (A) 07/12/2020 2027   PROTEINUR NEGATIVE 07/12/2020 2027   NITRITE NEGATIVE 07/12/2020 2027   LEUKOCYTESUR TRACE (A) 07/12/2020 2027   LEUKOCYTESUR Negative 09/14/2011 1624   Sepsis Labs: Invalid input(s): PROCALCITONIN, LACTICIDVEN   Time coordinating discharge: 35 minutes  SIGNED:  Mercy Riding, MD  [facility] 07/14/2020, 5:27 PM  If 7PM-7AM, please contact night-coverage [email or web address]

## 2020-07-14 NOTE — Evaluation (Signed)
Physical Therapy Evaluation and Discharge Patient Details Name: Adrienne Robinson MRN: [id number] DOB: 1955/04/28 Today's Date: 07/14/2020   History of Present Illness  Pt is a 65 y/o female admitted 5/23 secondary to muscle aches, weakness, and HTN. Found to have hyponatremia. PMH includes CKD, HTN, colon adenoma, and AVM of brain s/p embolization.  Clinical Impression  Patient evaluated by Physical Therapy with no further acute PT needs identified. All education has been completed and the patient has no further questions. Pt overall independent for all mobility tasks. No LOB noted and performed DGI tasks without LOB. Educated about generalized walking program for building endurance. See below for any follow-up Physical Therapy or equipment needs. PT is signing off. Thank you for this referral. If needs change, please re-consult.      Follow Up Recommendations No PT follow up    Equipment Recommendations  None recommended by PT    Recommendations for Other Services       Precautions / Restrictions Precautions Precautions: None Restrictions Weight Bearing Restrictions: No      Mobility  Bed Mobility Overal bed mobility: Independent                  Transfers Overall transfer level: Independent                  Ambulation/Gait Ambulation/Gait assistance: Independent Gait Distance (Feet): 200 Feet Assistive device: None Gait Pattern/deviations: WFL(Within Functional Limits) Gait velocity: WFL Gait velocity interpretation: >4.37 ft/sec, indicative of normal walking speed General Gait Details: Good gait speed. No LOB Noted when performing dynamic gait tasks.  Stairs            Wheelchair Mobility    Modified Rankin (Stroke Patients Only)       Balance Overall balance assessment: Independent                               Standardized Balance Assessment Standardized Balance Assessment : Dynamic Gait Index   Dynamic Gait  Index Level Surface: Normal Change in Gait Speed: Normal Gait with Horizontal Head Turns: Normal Gait with Vertical Head Turns: Normal Gait and Pivot Turn: Normal Step Over Obstacle: Normal Step Around Obstacles: Normal       Pertinent Vitals/Pain Pain Assessment: No/denies pain    Home Living Family/patient expects to be discharged to:: Private residence Living Arrangements: Spouse/significant other Available Help at Discharge: Family;Available PRN/intermittently Type of Home: Apartment Home Access: Level entry     Home Layout: One level Home Equipment: Shower seat;Toilet riser Additional Comments: Is the 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    Prior Function Level of Independence: Independent         Comments: Works as a Nutritional therapist        Extremity/Trunk Assessment   Upper Extremity Assessment Upper Extremity Assessment: Overall WFL for tasks assessed    Lower Extremity Assessment Lower Extremity Assessment: Overall WFL for tasks assessed    Cervical / Trunk Assessment Cervical / Trunk Assessment: Normal  Communication   Communication: No difficulties  Cognition Arousal/Alertness: Awake/alert Behavior During Therapy: WFL for tasks assessed/performed Overall Cognitive Status: Within Functional Limits for tasks assessed                                        General Comments General comments (skin integrity, edema,  etc.): Educated about generalized walking program to perform at home.    Exercises     Assessment/Plan    PT Assessment Patent does not need any further PT services  PT Problem List         PT Treatment Interventions      PT Goals (Current goals can be found in the Care Plan section)  Acute Rehab PT Goals Patient Stated Goal: to go home PT Goal Formulation: With patient Time For Goal Achievement: 07/14/20 Potential to Achieve Goals: Good    Frequency     Barriers to discharge        Co-evaluation                AM-PAC PT "6 Clicks" Mobility  Outcome Measure Help needed turning from your back to your side while in a flat bed without using bedrails?: None Help needed moving from lying on your back to sitting on the side of a flat bed without using bedrails?: None Help needed moving to and from a bed to a chair (including a wheelchair)?: None Help needed standing up from a chair using your arms (e.g., wheelchair or bedside chair)?: None Help needed to walk in hospital room?: None Help needed climbing 3-5 steps with a railing? : None 6 Click Score: 24    End of Session   Activity Tolerance: Patient tolerated treatment well Patient left: in bed;with call bell/phone within reach (sitting EOB) Nurse Communication: Mobility status PT Visit Diagnosis: Muscle weakness (generalized) (M62.81)    Time: 3491-7915 PT Time Calculation (min) (ACUTE ONLY): 12 min   Charges:   PT Evaluation $PT Eval Low Complexity: 1 Low          Lou Miner, DPT  Acute Rehabilitation Services  Pager: [phone] Office: [phone]   Rudean Hitt 07/14/2020, 10:21 AM

## 2020-07-14 NOTE — Progress Notes (Signed)
OT Cancellation Note  Patient Details Name: Adrienne Robinson MRN: [id number] DOB: 1956-01-17   Cancelled Treatment:    Reason Eval/Treat Not Completed: OT screened, no needs identified, will sign off (Pt is independent.)  Malka So, OTR/L 07/14/2020, 10:31 AM

## 2020-07-15 ENCOUNTER — Telehealth: Payer: Self-pay

## 2020-07-15 ENCOUNTER — Telehealth: Payer: Self-pay | Admitting: Internal Medicine

## 2020-07-15 NOTE — Telephone Encounter (Signed)
Case manager for Adrienne Robinson called in for patient left her phone number if needed [phone] ex [phone]

## 2020-07-15 NOTE — Telephone Encounter (Signed)
Transition Care Management Unsuccessful Follow-up Telephone Call  Date of discharge and from where:  07/14/2020-[facility]  Attempts:  1st Attempt  Reason for unsuccessful TCM follow-up call:  No answer/busy

## 2020-07-16 ENCOUNTER — Telehealth: Payer: Self-pay

## 2020-07-16 NOTE — Telephone Encounter (Signed)
Transition Care Management Unsuccessful Follow-up Telephone Call  Date of discharge and from where:  07/14/2020-[facility]  Attempts:  3rd Attempt  Reason for unsuccessful TCM follow-up call:  No answer/busy

## 2020-07-16 NOTE — Telephone Encounter (Signed)
Transition Care Management Unsuccessful Follow-up Telephone Call  Date of discharge and from where:  07/14/20 from [facility]  Attempts:  3rd Attempt. Nurse MS previously attempted x2.   Reason for unsuccessful TCM follow-up call:  Left voice message HFU previously scheduled 07/16/20.

## 2020-07-16 NOTE — Telephone Encounter (Signed)
Unsuccessful attempt to reach patient for 3rd TOC call. Nurse MS tried twice previously. See other note. Appointment scheduled 07/27/20 and will qualify for TCM.

## 2020-07-17 ENCOUNTER — Emergency Department (HOSPITAL_BASED_OUTPATIENT_CLINIC_OR_DEPARTMENT_OTHER)
Admission: EM | Admit: 2020-07-17 | Discharge: 2020-07-17 | Disposition: A | Discharge status: Home / Self Care | Payer: Managed Care, Other (non HMO) | Attending: Emergency Medicine | Admitting: Emergency Medicine

## 2020-07-17 ENCOUNTER — Other Ambulatory Visit: Payer: Self-pay

## 2020-07-17 ENCOUNTER — Encounter (HOSPITAL_BASED_OUTPATIENT_CLINIC_OR_DEPARTMENT_OTHER): Payer: Self-pay | Admitting: *Deleted

## 2020-07-17 DIAGNOSIS — Z85828 Personal history of other malignant neoplasm of skin: Secondary | ICD-10-CM | POA: Insufficient documentation

## 2020-07-17 DIAGNOSIS — I129 Hypertensive chronic kidney disease with stage 1 through stage 4 chronic kidney disease, or unspecified chronic kidney disease: Secondary | ICD-10-CM | POA: Insufficient documentation

## 2020-07-17 DIAGNOSIS — E871 Hypo-osmolality and hyponatremia: Secondary | ICD-10-CM

## 2020-07-17 DIAGNOSIS — R202 Paresthesia of skin: Secondary | ICD-10-CM | POA: Insufficient documentation

## 2020-07-17 DIAGNOSIS — R5383 Other fatigue: Secondary | ICD-10-CM | POA: Diagnosis not present

## 2020-07-17 DIAGNOSIS — R799 Abnormal finding of blood chemistry, unspecified: Secondary | ICD-10-CM | POA: Diagnosis present

## 2020-07-17 DIAGNOSIS — E876 Hypokalemia: Secondary | ICD-10-CM

## 2020-07-17 DIAGNOSIS — Z96611 Presence of right artificial shoulder joint: Secondary | ICD-10-CM | POA: Insufficient documentation

## 2020-07-17 DIAGNOSIS — Z79899 Other long term (current) drug therapy: Secondary | ICD-10-CM | POA: Diagnosis not present

## 2020-07-17 DIAGNOSIS — N183 Chronic kidney disease, stage 3 unspecified: Secondary | ICD-10-CM | POA: Insufficient documentation

## 2020-07-17 LAB — COMPREHENSIVE METABOLIC PANEL
ALT: 19 U/L (ref 0–44)
AST: 22 U/L (ref 15–41)
Albumin: 4.5 g/dL (ref 3.5–5.0)
Alkaline Phosphatase: 71 U/L (ref 38–126)
Anion gap: 11 (ref 5–15)
BUN: 12 mg/dL (ref 8–23)
CO2: 23 mmol/L (ref 22–32)
Calcium: 9.2 mg/dL (ref 8.9–10.3)
Chloride: 96 mmol/L — ABNORMAL LOW (ref 98–111)
Creatinine, Ser: 0.71 mg/dL (ref 0.44–1.00)
GFR, Estimated: >60 mL/min (ref >60)
Glucose, Bld: 107 mg/dL — ABNORMAL HIGH (ref 70–99)
Potassium: 3.2 mmol/L — ABNORMAL LOW (ref 3.5–5.1)
Sodium: 130 mmol/L — ABNORMAL LOW (ref 135–145)
Total Bilirubin: 0.4 mg/dL (ref 0.3–1.2)
Total Protein: 8 g/dL (ref 6.5–8.1)

## 2020-07-17 LAB — CBC
HCT: 31.4 % — ABNORMAL LOW (ref 36.0–46.0)
Hemoglobin: 11 g/dL — ABNORMAL LOW (ref 12.0–15.0)
MCH: 33 pg (ref 26.0–34.0)
MCHC: 35 g/dL (ref 30.0–36.0)
MCV: 94.3 fL (ref 80.0–100.0)
Platelets: 311 10*3/uL (ref 150–400)
RBC: 3.33 MIL/uL — ABNORMAL LOW (ref 3.87–5.11)
RDW: 12 % (ref 11.5–15.5)
WBC: 3.3 10*3/uL — ABNORMAL LOW (ref 4.0–10.5)
nRBC: 0 % (ref 0.0–0.2)

## 2020-07-17 MED ORDER — SODIUM CHLORIDE 0.9 % IV BOLUS
1000.0000 mL | Freq: Once | INTRAVENOUS | Status: AC
  Administered 2020-07-17: 1000 mL via INTRAVENOUS

## 2020-07-17 MED ORDER — POTASSIUM CHLORIDE CRYS ER 20 MEQ PO TBCR
40.0000 meq | EXTENDED_RELEASE_TABLET | Freq: Once | ORAL | Status: AC
  Administered 2020-07-17: 40 meq via ORAL
  Filled 2020-07-17: qty 2

## 2020-07-17 MED ORDER — POTASSIUM CHLORIDE CRYS ER 20 MEQ PO TBCR: EXTENDED_RELEASE_TABLET | ORAL | 0 refills | Status: DC

## 2020-07-17 NOTE — ED Notes (Signed)
Water was given to the Pt.

## 2020-07-17 NOTE — ED Provider Notes (Addendum)
[facility]PARTMENT Provider Note   CSN: [id number] Arrival date & time: 07/17/20  1957     History Chief Complaint  Patient presents with  . Abnormal Lab    Adrienne Robinson is a 65 y.o. female.  Patient c/o wanting Na checked as recent d/c from hospital, and continues to feel similar, generally fatigued, occasional paresthesias (diffuse, bilateral). Symptoms gradual onset, mild-mod, persistent. States does not feel as severe or as bad when recently admitted but wanted to make sure. States when was d/c'd from hospital was told to cut back on diet coke use, but otherwise denies change in meds. Denies specific diagnosis. No acute weight loss, is eating/drinking but states not a big drinker at baseline. Had a diarrheal stool yesterday, but states not unusual for her. No acute/severe diarrhea. No vomiting. States spouse is on low na diet, so does not add salt to foods. Denies drinking much water. No thiazide diuretic use. Denies fever or chills. No headache. No chest pain or sob.   The history is provided by the patient and a relative.  Abnormal Lab      Past Medical History:  Diagnosis Date  . Allergy   . Arthritis   . 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 B12 deficiency 01/2016  . Candida esophagitis ([place])   . Degenerative joint disease involving multiple joints   . Depression   . Dizziness   . Hypertension   . Pneumonia    approx 6 years ago  . Scoliosis   . Spinal stenosis of lumbar region   . Squamous cell carcinoma of skin 06/24/2018   R lateral calf  . Tubular adenoma of colon     Patient Active Problem List   Diagnosis Date Noted  . Back pain 07/13/2020  . Hyponatremia 07/12/2020  . Iron overload 04/23/2020  . Anemia 02/12/2020  . Hypertriglyceridemia 02/12/2020  . 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 Prepatellar effusion of right knee 06/09/2019  . Trigger point of shoulder  region, left 10/29/2018  . Chronic kidney disease, stage 3, mod decreased GFR (HCC) 08/22/2018  . Abnormal TSH 05/29/2018  . Arthritis of carpometacarpal [facility]) joint of left thumb 02/21/2017  . Anxiety associated with depression 01/24/2017  . IBS (irritable bowel syndrome) 07/22/2016  . Nonallopathic lesion of lumbosacral region 03/22/2016  . Nonallopathic lesion of sacral region 03/22/2016  . Nonallopathic lesion of thoracic region 03/22/2016  . B12 deficiency 03/19/2016  . Major depressive disorder, single episode, moderate ([place]) 03/19/2016  . History of gout 01/13/2016  . Chronic pain syndrome 11/11/2015  . Constipation due to opioid therapy 05/08/2015  . Cervical spondylosis without myelopathy 05/08/2015  . History of oral aphthous ulcers 05/06/2015  . Encounter for preventive health examination 08/08/2014  . Fibromyalgia 01/03/2014  . Osteoarthritis of shoulder 06/27/2013  . Inflammatory polyarthropathy ([place]) 04/13/2013  . S/P abdominal hysterectomy 03/04/2013  . Chronic reflux esophagitis 10/29/2012  . Other neutropenia ([place]) 03/05/2012  . AVM (arteriovenous malformation) brain 01/31/2012  . Need for influenza vaccination 01/28/2012  . Family history of colon cancer 10/04/2011  . Hypertension   . Degenerative joint disease involving multiple joints   . Spinal stenosis of lumbar region     Past Surgical History:  Procedure Laterality Date  . ABDOMINAL HYSTERECTOMY    . APPENDECTOMY    . CARDIAC CATHETERIZATION     normal coronaries, no wall motion abnormalities 11/02/09 [facility])  . NASAL SINUS  SURGERY    . OOPHORECTOMY    . RADIOLOGY WITH ANESTHESIA  02/26/2012   Procedure: RADIOLOGY WITH ANESTHESIA;  Surgeon: [name], MD;  Location: [place];  Service: Radiology;  Laterality: N/A;  . REVERSE SHOULDER ARTHROPLASTY Right 06/27/2013   Procedure: REVERSE RIGHT TOTAL SHOULDER ARTHROPLASTY;  Surgeon: [name], MD;  Location: [place];  Service: Orthopedics;   Laterality: Right;  . SHOULDER ARTHROSCOPY WITH ROTATOR CUFF REPAIR AND SUBACROMIAL DECOMPRESSION  2007   left  . TONSILLECTOMY  adnoids     OB History   No obstetric history on file.     Family History  Problem Relation Age of Onset  . Colon cancer Father 41  . Diabetes Father   . Heart disease Father   . Rectal cancer Father   . Thyroid disease Mother   . Colon cancer Paternal Grandfather   . Colon cancer Other        pggm  . Breast cancer Paternal Grandmother   . Stomach cancer Neg Hx     Social History   Tobacco Use  . Smoking status: Never Smoker  . Smokeless tobacco: Never Used  Vaping Use  . Vaping Use: Never used  Substance Use Topics  . Alcohol use: Yes    Alcohol/week: 2.0 standard drinks    Types: 2 Glasses of wine per week    Comment: 2-3 week with dinner  . Drug use: No    Home Medications Prior to Admission medications   Medication Sig Start Date End Date Taking? Authorizing Provider  amLODipine (NORVASC) 10 MG tablet TAKE 1 TABLET BY MOUTH EVERY DAY Patient taking differently: Take 10 mg by mouth daily. TAKE 1 TABLET BY MOUTH EVERY DAY 06/08/20   [name], MD  buPROPion (WELLBUTRIN XL) 150 MG 24 hr tablet TAKE 3 TABLETS (450 MG TOTAL) BY MOUTH DAILY. Patient taking differently: Take 150 mg by mouth every morning. 09/01/19   [name], MD  Cholecalciferol (VITAMIN D3) 50 MCG (2000 UT) CAPS Take 1 capsule by mouth daily.    [provider]  cyanocobalamin (,VITAMIN B-12,) 1000 MCG/ML injection INJECT 1 ML (1,000 MCG TOTAL) INTO THE MUSCLE ONCE A WEEK. Patient taking differently: Inject 1,000 mcg into the muscle once a week. Usually sundays 09/17/19   [name], MD  dicyclomine (BENTYL) 20 MG tablet TAKE 1 TABLET (20 MG TOTAL) BY MOUTH 3 (THREE) TIMES DAILY BEFORE MEALS. 04/23/20   Pyrtle, Lajuan Lines, MD  esomeprazole (NEXIUM) 20 MG capsule Take 20 mg by mouth daily at 12 noon.    [provider]  fluticasone (FLONASE) 50  MCG/ACT nasal spray USE 2 SPRAYS IN EACH NOSTRIL DAILY AS NEEDED Patient taking differently: Place 2 sprays into both nostrils daily as needed for allergies or rhinitis. USE 2 SPRAYS IN EACH NOSTRIL DAILY AS NEEDED 05/29/18   [name], MD  HYDROcodone-acetaminophen (NORCO) 10-325 MG tablet Take 1 tablet by mouth every 6 (six) hours as needed for severe pain. 07/06/20 08/05/20  [name], MD  linaclotide (LINZESS) 145 MCG CAPS capsule Take 1 capsule (145 mcg total) by mouth daily before breakfast. NEEDS APPT FOR FURTHER REFILLS 06/08/20   Pyrtle, Lajuan Lines, MD  metoprolol succinate (TOPROL-XL) 25 MG 24 hr tablet TAKE 1 TABLET BY MOUTH EVERY DAY Patient taking differently: Take 25 mg by mouth every morning. 06/08/20   [name], MD  Multiple Vitamin (MULTIVITAMIN) tablet Take 1 tablet by mouth daily.  [provider]  Syringe/Needle, Disp, (SYRINGE 3CC/25GX1") 25G X 1" 3 ML MISC Use for b12 injections 08/21/18   [name], MD  zolpidem (AMBIEN) 10 MG tablet TAKE 1 TABLET (10 MG TOTAL) BY MOUTH AT BEDTIME AS NEEDED. FOR SLEEP 07/12/20   [name], MD  ferrous sulfate 325 (65 FE) MG EC tablet Take 1 tablet (325 mg total) by mouth daily after breakfast. 01/06/11 05/04/11  [name], MD  metFORMIN (GLUCOPHAGE) 500 MG tablet Take 500 mg by mouth 2 (two) times daily with a meal.    05/04/11  [provider]    Allergies    Adhesive [tape] and Morphine and related  Review of Systems   Review of Systems  Constitutional: Negative for chills and fever.  HENT: Negative for sore throat.   Eyes: Negative for visual disturbance.  Respiratory: Negative for cough and shortness of breath.   Cardiovascular: Negative for chest pain.  Gastrointestinal: Negative for abdominal pain and vomiting.  Endocrine: Negative for polyuria.  Genitourinary: Negative for dysuria and flank pain.  Musculoskeletal: Negative for back pain and neck pain.  Skin: Negative for rash.   Neurological: Negative for headaches.  Hematological: Does not bruise/bleed easily.  Psychiatric/Behavioral: Negative for confusion.    Physical Exam Updated Vital Signs BP (!) 162/95 (BP Location: Right Arm)   Pulse 69   Temp 97.8 F (36.6 C) (Oral)   Resp 18   Ht 1.676 m (5\' 6" )   Wt 55.9 kg   SpO2 100%   BMI 19.89 kg/m   Physical Exam Vitals and nursing note reviewed.  Constitutional:      Appearance: Normal appearance. She is well-developed.  HENT:     Head: Atraumatic.     Nose: Nose normal.     Mouth/Throat:     Mouth: Mucous membranes are moist.  Eyes:     General: No scleral icterus.    Conjunctiva/sclera: Conjunctivae normal.  Neck:     Trachea: No tracheal deviation.     Comments: Thyroid not grossly enlarged or tender.  Cardiovascular:     Rate and Rhythm: Normal rate and regular rhythm.     Pulses: Normal pulses.     Heart sounds: Normal heart sounds. No murmur heard. No friction rub. No gallop.   Pulmonary:     Effort: Pulmonary effort is normal. No respiratory distress.     Breath sounds: Normal breath sounds.  Abdominal:     General: Bowel sounds are normal. There is no distension.     Palpations: Abdomen is soft. There is no mass.     Tenderness: There is no abdominal tenderness.  Genitourinary:    Comments: No cva tenderness.  Musculoskeletal:        General: No swelling or tenderness.     Cervical back: Normal range of motion and neck supple. No rigidity. No muscular tenderness.     Right lower leg: No edema.     Left lower leg: No edema.  Skin:    General: Skin is warm and dry.     Findings: No rash.  Neurological:     Mental Status: She is alert.     Comments: Alert, speech normal.   Psychiatric:        Mood and Affect: Mood normal.     ED Results / Procedures / Treatments   Labs (all labs ordered are listed, but only abnormal results are displayed) Results for orders placed or performed during the hospital encounter of  07/17/20   Comprehensive metabolic panel  Result Value Ref Range   Sodium 130 (L) 135 - 145 mmol/L   Potassium 3.2 (L) 3.5 - 5.1 mmol/L   Chloride 96 (L) 98 - 111 mmol/L   CO2 23 22 - 32 mmol/L   Glucose, Bld 107 (H) 70 - 99 mg/dL   BUN 12 8 - 23 mg/dL   Creatinine, Ser 0.71 0.44 - 1.00 mg/dL   Calcium 9.2 8.9 - 10.3 mg/dL   Total Protein 8.0 6.5 - 8.1 g/dL   Albumin 4.5 3.5 - 5.0 g/dL   AST 22 15 - 41 U/L   ALT 19 0 - 44 U/L   Alkaline Phosphatase 71 38 - 126 U/L   Total Bilirubin 0.4 0.3 - 1.2 mg/dL   GFR, Estimated >60 >60 mL/min   Anion gap 11 5 - 15  CBC  Result Value Ref Range   WBC 3.3 (L) 4.0 - 10.5 K/uL   RBC 3.33 (L) 3.87 - 5.11 MIL/uL   Hemoglobin 11.0 (L) 12.0 - 15.0 g/dL   HCT 31.4 (L) 36.0 - 46.0 %   MCV 94.3 80.0 - 100.0 fL   MCH 33.0 26.0 - 34.0 pg   MCHC 35.0 30.0 - 36.0 g/dL   RDW 12.0 11.5 - 15.5 %   Platelets 311 150 - 400 K/uL   nRBC 0.0 0.0 - 0.2 %   *Note: Due to a large number of results and/or encounters for the requested time period, some results have not been displayed. A complete set of results can be found in Results Review.   CT Head Wo Contrast  Result Date: 07/12/2020 CLINICAL DATA:  Mental status change.  Hypertension. EXAM: CT HEAD WITHOUT CONTRAST TECHNIQUE: Contiguous axial images were obtained from the base of the skull through the vertex without intravenous contrast. COMPARISON:  Head CT 02/29/2020 FINDINGS: Brain: High-density material in the parafalcine right parietal lobe is unchanged from prior exam, consistent with treated AVM. No associated or other intracranial hemorrhage. No midline shift or mass effect. No evidence of acute ischemia. No hydrocephalus. No subdural or extra-axial collection. Vascular: Hyperdense vessel. Skull: No fracture or focal lesion. Sinuses/Orbits: Trace mucosal thickening of left maxillary sinus and left side of sphenoid sinus. Mastoid air cells are clear. No orbital abnormality. Other: None. IMPRESSION: 1. No acute  intracranial abnormality. 2. Unchanged hyperdense material in the parafalcine right parietal lobe, consistent with treated AVM. Electronically Signed   By: Keith Rake M.D.   On: 07/12/2020 20:54   DG Chest Port 1 View  Result Date: 07/12/2020 CLINICAL DATA:  Hypertension EXAM: PORTABLE CHEST 1 VIEW COMPARISON:  01/25/2018 FINDINGS: Lungs are well expanded, symmetric, and clear. No pneumothorax or pleural effusion. Cardiac size within normal limits. Pulmonary vascularity is normal. Osseous structures are age-appropriate. Multiple healed right rib fractures are again noted. Right total shoulder arthroplasty has been performed. No acute bone abnormality. IMPRESSION: No active disease. Electronically Signed   By: Fidela Salisbury MD   On: 07/12/2020 22:04    EKG None  Radiology No results found.  Procedures Procedures   Medications Ordered in ED Medications - No data to display  ED Course  I have reviewed the triage vital signs and the nursing notes.  Pertinent labs & imaging results that were available during my care of the patient were reviewed by me and considered in my medical decision making (see chart for details).    MDM Rules/Calculators/A&P  Labs sent.   Reviewed nursing notes and prior charts for additional history.  Recent admission and d/c summary reviewed.  Labs reviewed/interpreted by me -  Na mildly low, 130. k sl low. kcl po.   Iv ns bolus. Kcl.   Discussed getting adequate salt in diet. pcp f/u 2 days.   Pt currently appears stable for d/c.     Final Clinical Impression(s) / ED Diagnoses Final diagnoses:  None    Rx / DC Orders ED Discharge Orders    None           Lajean Saver, MD 07/17/20 2211

## 2020-07-17 NOTE — ED Triage Notes (Signed)
Pt d/c from [facility] Wednesday after being admitted for low sodium. States she felt good until today and now she feels similar to the way she did when she was admitted

## 2020-07-17 NOTE — Discharge Instructions (Addendum)
It was our pleasure to provide your ER care today - we hope that you feel better.  From today's labs, your sodium level is mildly low (130) and potassium also low (3.2).  make sure to get adequate salt in diet, eat plenty of fruits and vegetables, take potassium supplement as prescribed, and follow up with primary care doctor in 2-3 days for recheck, and recheck of labs.   Return to ER if worse, new symptoms, fevers, new or severe pain, persistent vomiting, severe diarrhea, weak/fainting, or other concern.

## 2020-07-20 ENCOUNTER — Telehealth: Payer: Self-pay | Admitting: *Deleted

## 2020-07-20 DIAGNOSIS — E871 Hypo-osmolality and hyponatremia: Secondary | ICD-10-CM

## 2020-07-20 NOTE — Telephone Encounter (Signed)
Please place future orders for lab appt.  

## 2020-07-21 ENCOUNTER — Other Ambulatory Visit: Payer: Managed Care, Other (non HMO)

## 2020-07-22 ENCOUNTER — Other Ambulatory Visit: Payer: Self-pay

## 2020-07-22 ENCOUNTER — Other Ambulatory Visit (INDEPENDENT_AMBULATORY_CARE_PROVIDER_SITE_OTHER): Payer: Managed Care, Other (non HMO)

## 2020-07-22 DIAGNOSIS — E871 Hypo-osmolality and hyponatremia: Secondary | ICD-10-CM

## 2020-07-23 LAB — BASIC METABOLIC PANEL
BUN: 16 mg/dL (ref 6–23)
CO2: 24 mEq/L (ref 19–32)
Calcium: 9.6 mg/dL (ref 8.4–10.5)
Chloride: 104 mEq/L (ref 96–112)
Creatinine, Ser: 0.9 mg/dL (ref 0.40–1.20)
GFR: 67.54 mL/min (ref >60.00)
Glucose, Bld: 95 mg/dL (ref 70–99)
Potassium: 4.3 mEq/L (ref 3.5–5.1)
Sodium: 138 mEq/L (ref 135–145)

## 2020-07-23 MED ORDER — LINACLOTIDE 145 MCG PO CAPS: 145.0000 ug | ORAL_CAPSULE | Freq: Every day | ORAL | 2 refills | Status: DC

## 2020-07-23 NOTE — Addendum Note (Signed)
Addended by: Larina Bras on: 07/23/2020 12:36 PM   Modules accepted: Orders

## 2020-07-27 ENCOUNTER — Other Ambulatory Visit: Payer: Self-pay

## 2020-07-27 ENCOUNTER — Ambulatory Visit (INDEPENDENT_AMBULATORY_CARE_PROVIDER_SITE_OTHER): Payer: Managed Care, Other (non HMO) | Admitting: Internal Medicine

## 2020-07-27 ENCOUNTER — Encounter: Payer: Self-pay | Admitting: Internal Medicine

## 2020-07-27 VITALS — BP 120/82 | HR 80 | Temp 96.2°F | Resp 15 | Ht 66.0 in | Wt 122.6 lb

## 2020-07-27 DIAGNOSIS — F419 Anxiety disorder, unspecified: Secondary | ICD-10-CM

## 2020-07-27 DIAGNOSIS — E876 Hypokalemia: Secondary | ICD-10-CM

## 2020-07-27 DIAGNOSIS — Z9229 Personal history of other drug therapy: Secondary | ICD-10-CM

## 2020-07-27 DIAGNOSIS — I10 Essential (primary) hypertension: Secondary | ICD-10-CM

## 2020-07-27 DIAGNOSIS — D649 Anemia, unspecified: Secondary | ICD-10-CM

## 2020-07-27 DIAGNOSIS — M48061 Spinal stenosis, lumbar region without neurogenic claudication: Secondary | ICD-10-CM

## 2020-07-27 DIAGNOSIS — E871 Hypo-osmolality and hyponatremia: Secondary | ICD-10-CM | POA: Diagnosis not present

## 2020-07-27 DIAGNOSIS — Z09 Encounter for follow-up examination after completed treatment for conditions other than malignant neoplasm: Secondary | ICD-10-CM

## 2020-07-27 MED ORDER — BUPROPION HCL ER (XL) 150 MG PO TB24: 150.0000 mg | ORAL_TABLET | Freq: Every morning | ORAL | 1 refills | Status: DC

## 2020-07-27 MED ORDER — BUSPIRONE HCL 10 MG PO TABS: 10.0000 mg | ORAL_TABLET | Freq: Three times a day (TID) | ORAL | 1 refills | Status: DC

## 2020-07-27 NOTE — Progress Notes (Signed)
Subjective:  Patient ID: Adrienne Robinson, female    DOB: 1955/09/15  Age: 65 y.o. MRN: [id number]  CC: The primary encounter diagnosis was Hypokalemia. Diagnoses of Hyponatremia, COVID-19 vaccine series completed, Primary hypertension, Spinal stenosis of lumbar region without neurogenic claudication, Hospital discharge follow-up, Anemia, unspecified type, and Anxiety were also pertinent to this visit.  HPI IYLA BALZARINI presents for hospital follow up  This visit occurred during the SARS-CoV-2 public health emergency.  Safety protocols were in place, including screening questions prior to the visit, additional usage of staff PPE, and extensive cleaning of exam room while observing appropriate contact time as indicated for disinfecting solutions.  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    Admission sodium was 113 .  Urine sodium was elevated at 33.  She was  treated with hyptertonic saline infused slowly over several hours and discharged home on May 25 with in stable improved condition.  Advised to reduce her intake of diet sodas.  She returned to the ER on May 28 with fatigue and concern that her sodium had dropped again but it had not .    Reviewed the events surrounding her admission.  She attributes the hyponatremia to decreased po intake.  She had stopped eating due to the emotional stress of  managing her husband's  serious medical issues land her full time job . Husband Adrienne Robinson has ESKD  and history of BKA due to gangrene from uncontrolled diabetes , and very recently started peritoneal dialysis .  She denies excessive alcohol use. She is eating more regularly now and supplementing diet with gatorade and liberalization of salt.   Reviewed options for improving her anxiety  level.  Advised to consider adding buspirone twice daily.     Outpatient Medications Prior to Visit  Medication Sig Dispense Refill   amLODipine (NORVASC) 10 MG tablet TAKE 1 TABLET BY MOUTH EVERY DAY (Patient taking differently: Take 10 mg by mouth daily. TAKE 1 TABLET BY MOUTH EVERY DAY) 90 tablet 1   Cholecalciferol (VITAMIN D3) 50 MCG (2000 UT) CAPS Take 1 capsule by mouth daily.     cyanocobalamin (,VITAMIN B-12,) 1000 MCG/ML injection INJECT 1 ML (1,000 MCG TOTAL) INTO THE MUSCLE ONCE A WEEK. (Patient taking differently: Inject 1,000 mcg into the muscle once a week. Usually sundays) 12 mL 6   dicyclomine (BENTYL) 20 MG tablet TAKE 1 TABLET (20 MG TOTAL) BY MOUTH 3 (THREE) TIMES DAILY BEFORE MEALS. 60 tablet 3   esomeprazole (NEXIUM) 20 MG capsule Take 20 mg by mouth daily at 12 noon.     fluticasone (FLONASE) 50 MCG/ACT nasal spray USE 2 SPRAYS IN EACH NOSTRIL DAILY AS NEEDED (Patient taking differently: Place 2 sprays into both nostrils daily as needed for allergies or rhinitis. USE 2 SPRAYS IN EACH NOSTRIL DAILY AS NEEDED) 16 g 1   HYDROcodone-acetaminophen (NORCO) 10-325 MG tablet Take 1 tablet by mouth every 6 (six) hours as needed for severe pain. 120 tablet 0   linaclotide (LINZESS) 145 MCG CAPS capsule Take 1 capsule (145 mcg total) by mouth daily before breakfast. MUST KEEP 10/21/20 APPT FOR FURTHER REFILLS 30 capsule 2   metoprolol succinate (TOPROL-XL) 25 MG 24 hr tablet TAKE 1 TABLET BY MOUTH EVERY DAY (Patient taking differently: Take 25 mg by  mouth every morning.) 90 tablet 1   Multiple Vitamin (MULTIVITAMIN) tablet Take 1 tablet by mouth daily.     potassium chloride SA (KLOR-CON) 20 MEQ tablet One po bid x 3 days, then one tablet once a day 20 tablet 0   Syringe/Needle, Disp, (SYRINGE 3CC/25GX1") 25G X 1" 3 ML MISC Use for b12 injections 50 each 0   zolpidem (AMBIEN) 10 MG tablet TAKE 1 TABLET (10 MG TOTAL) BY MOUTH AT BEDTIME AS NEEDED. FOR SLEEP 30 tablet 2   buPROPion  (WELLBUTRIN XL) 150 MG 24 hr tablet TAKE 3 TABLETS (450 MG TOTAL) BY MOUTH DAILY. (Patient taking differently: Take 150 mg by mouth every morning.) 270 tablet 4   No facility-administered medications prior to visit.    Review of Systems;  Patient denies headache, fevers, malaise, unintentional weight loss, skin rash, eye pain, sinus congestion and sinus pain, sore throat, dysphagia,  hemoptysis , cough, dyspnea, wheezing, chest pain, palpitations, orthopnea, edema, abdominal pain, nausea, melena, diarrhea, constipation, flank pain, dysuria, hematuria, urinary  Frequency, nocturia, numbness, tingling, seizures,  Focal weakness, Loss of consciousness,  Tremor, insomnia, depression, anxiety, and suicidal ideation.      Objective:  BP 120/82 (BP Location: Left Arm, Patient Position: Sitting, Cuff Size: Normal)   Pulse 80   Temp (!) 96.2 F (35.7 C) (Temporal)   Resp 15   Ht 5\' 6"  (1.676 m)   Wt 122 lb 9.6 oz (55.6 kg)   SpO2 99%   BMI 19.79 kg/m   BP Readings from Last 3 Encounters:  07/27/20 120/82  07/17/20 (!) 141/78  07/14/20 108/62    Wt Readings from Last 3 Encounters:  07/27/20 122 lb 9.6 oz (55.6 kg)  07/17/20 123 lb 3.8 oz (55.9 kg)  07/13/20 123 lb 3.8 oz (55.9 kg)    General appearance: alert, cooperative and appears stated age Ears: normal TM's and external ear canals both ears Throat: lips, mucosa, and tongue normal; teeth and gums normal Neck: no adenopathy, no carotid bruit, supple, symmetrical, trachea midline and thyroid not enlarged, symmetric, no tenderness/mass/nodules Back: symmetric, no curvature. ROM normal. No CVA tenderness. Lungs: clear to auscultation bilaterally Heart: regular rate and rhythm, S1, S2 normal, no murmur, click, rub or gallop Abdomen: soft, non-tender; bowel sounds normal; no masses,  no organomegaly Pulses: 2+ and symmetric Skin: Skin color, texture, turgor normal. No rashes or lesions Lymph nodes: Cervical, supraclavicular, and  axillary nodes normal.  Lab Results  Component Value Date   HGBA1C 5.7 08/04/2011    Lab Results  Component Value Date   CREATININE 0.90 07/22/2020   CREATININE 0.71 07/17/2020   CREATININE 0.75 07/14/2020    Lab Results  Component Value Date   WBC 3.3 (L) 07/17/2020   HGB 11.0 (L) 07/17/2020   HCT 31.4 (L) 07/17/2020   PLT 311 07/17/2020   GLUCOSE 95 07/22/2020   CHOL 227 (H) 01/22/2020   TRIG 570.0 (H) 01/22/2020   HDL 54.10 01/22/2020   LDLDIRECT 81.0 01/22/2020   LDLCALC 102 (H) 11/14/2018   ALT 19 07/17/2020   AST 22 07/17/2020   NA 138 07/22/2020   K 4.3 07/22/2020   CL 104 07/22/2020   CREATININE 0.90 07/22/2020   BUN 16 07/22/2020   CO2 24 07/22/2020   TSH 0.706 07/13/2020   INR 1.0 07/12/2020   HGBA1C 5.7 08/04/2011   MICROALBUR 1.1 01/22/2020    No results found.  Assessment & Plan:   Problem List Items Addressed This Visit  Unprioritized   Hypertension    Well controlled on amlodipine 10 mg daily  by home readings . Marland Kitchen Renal function and electrolytes have normalized,  no changes today.  Lab Results  Component Value Date   CREATININE 0.90 07/22/2020   Lab Results  Component Value Date   NA 138 07/22/2020   K 4.3 07/22/2020   CL 104 07/22/2020   CO2 24 07/22/2020          Spinal stenosis of lumbar region    Managed with a standing desk for work mode and opioids for non operable pain.  She has not had any ER visits for pain management and has not requested any early refills.  Her Refill history was confirmed via [place] Controlled Substance database by me today during her visit and there have been no prescriptions of controlled substances filled from any providers other than me. Last refill was May 17.        Anemia    Etiology unclear. Persistent for the past year.  Reviewed workup which has included EGD FOBTS were negative Jan 2021.  Last colonoscopy  Was in 2018 .iron stores last year suggested overload due to alcohol use.  Will  recheck along with SPEP, urine IFE   Lab Results  Component Value Date   WBC 3.3 (L) 07/17/2020   HGB 11.0 (L) 07/17/2020   HCT 31.4 (L) 07/17/2020   MCV 94.3 07/17/2020   PLT 311 07/17/2020          Relevant Orders   Iron, TIBC and Ferritin Panel   Protein electrophoresis, serum   IFE AND PE, RANDOM URINE   Hyponatremia    Etiology multifactorial: SIADH of unknown cause and dehdyration.  Resolved currently with improved po intake and liberalization of salt in diet   Lab Results  Component Value Date   NA 138 07/22/2020   K 4.3 07/22/2020   CL 104 07/22/2020   CO2 24 07/22/2020          Relevant Orders   Basic metabolic panel   Basic metabolic panel   Hospital discharge follow-up    Patient is stable post discharge and has no new issues or questions about discharge plans at the visit today for hospital follow up. All labs , imaging studies and progress notes from admission were reviewed with patient today         Anxiety    Aggravated by husband's chronic but serious health issues, including recent initiation of peritoneal dialysis.  Adding buspirone 10 mg bid. 3rd dose given for use in increasing the 10 mg dose to 15 mg or adding a 3rd dose in the evening.  Avoiding benzodiazepines given concurrent opioid use        Relevant Medications   buPROPion (WELLBUTRIN XL) 150 MG 24 hr tablet   busPIRone (BUSPAR) 10 MG tablet   Other Visit Diagnoses     Hypokalemia    -  Primary   Relevant Orders   Basic metabolic panel   [id number] vaccine series completed       Relevant Orders   SARS-CoV-2 Semi-Quantitative Total Antibody, Spike       I have changed Jan D. Overbey's buPROPion. I am also having her start on busPIRone. Additionally, I am having her maintain her fluticasone, SYRINGE 3CC/25GX1", multivitamin, cyanocobalamin, vitamin D3, dicyclomine, esomeprazole, HYDROcodone-acetaminophen, amLODipine, metoprolol succinate, zolpidem, potassium chloride SA, and  linaclotide.  Meds ordered this encounter  Medications   buPROPion (WELLBUTRIN XL) 150 MG 24 hr tablet  Sig: Take 1 tablet (150 mg total) by mouth every morning.    Dispense:  90 tablet    Refill:  1    NOTE DOSE CORRECTION. KEEP ON FILE FOR FUTURE REFILLS   busPIRone (BUSPAR) 10 MG tablet    Sig: Take 1 tablet (10 mg total) by mouth 3 (three) times daily.    Dispense:  90 tablet    Refill:  1    Medications Discontinued During This Encounter  Medication Reason   buPROPion (WELLBUTRIN XL) 150 MG 24 hr tablet     Follow-up: Return in about 1 week (around 08/03/2020).   [name], MD

## 2020-07-27 NOTE — Patient Instructions (Addendum)
STOP THE POTASSIUM SUPPLEMENT  RETURN IN A WEEK FOR A RECHECK AND WE WILL ALSO CHECK YOUR COVID ANTIBODY LEVEL   Healthy Choice low carb power bowl entrees AND Steamer entrees  AS WELL AS  FITKITCHEN cauliflower pizza bowls are great low carb entrees that microwave in 5 minutes    1 ounce of dill pickle juice has 350 mg sodium in it     Starting buspirone for the anxiety.  10 mg twice daily.  Can add 3rd dose or use it to augment doses 1 and 2  Continue wellbutrin

## 2020-07-30 ENCOUNTER — Encounter: Payer: Self-pay | Admitting: Internal Medicine

## 2020-07-30 DIAGNOSIS — F419 Anxiety disorder, unspecified: Secondary | ICD-10-CM | POA: Insufficient documentation

## 2020-07-30 DIAGNOSIS — Z09 Encounter for follow-up examination after completed treatment for conditions other than malignant neoplasm: Secondary | ICD-10-CM | POA: Insufficient documentation

## 2020-07-30 NOTE — Assessment & Plan Note (Signed)
Patient is stable post discharge and has no new issues or questions about discharge plans at the visit today for hospital follow up. All labs , imaging studies and progress notes from admission were reviewed with patient today   

## 2020-07-30 NOTE — Assessment & Plan Note (Signed)
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  Resolved currently with improved po intake and liberalization of salt in diet   Lab Results  Component Value Date   NA 138 07/22/2020   K 4.3 07/22/2020   CL 104 07/22/2020   CO2 24 07/22/2020

## 2020-07-30 NOTE — Assessment & Plan Note (Signed)
Well controlled on amlodipine 10 mg daily  by home readings . Marland Kitchen Renal function and electrolytes have normalized,  no changes today.  Lab Results  Component Value Date   CREATININE 0.90 07/22/2020   Lab Results  Component Value Date   NA 138 07/22/2020   K 4.3 07/22/2020   CL 104 07/22/2020   CO2 24 07/22/2020

## 2020-07-30 NOTE — Assessment & Plan Note (Signed)
Aggravated by husband's chronic but serious health issues, including recent initiation of peritoneal dialysis.  Adding buspirone 10 mg bid. 3rd dose given for use in increasing the 10 mg dose to 15 mg or adding a 3rd dose in the evening.  Avoiding benzodiazepines given concurrent opioid use

## 2020-07-30 NOTE — Assessment & Plan Note (Addendum)
Etiology unclear. Persistent for the past year.  Reviewed workup which has included EGD FOBTS were negative Jan 2021.  Last colonoscopy  Was in 2018 .iron stores last year suggested overload due to alcohol use.  Will recheck along with SPEP, urine IFE   Lab Results  Component Value Date   WBC 3.3 (L) 07/17/2020   HGB 11.0 (L) 07/17/2020   HCT 31.4 (L) 07/17/2020   MCV 94.3 07/17/2020   PLT 311 07/17/2020

## 2020-07-30 NOTE — Assessment & Plan Note (Signed)
Managed with a standing desk for work mode and opioids for non operable pain.  She has not had any ER visits for pain management and has not requested any early refills.  Her Refill history was confirmed via [place] Controlled Substance database by me today during her visit and there have been no prescriptions of controlled substances filled from any providers other than me. Last refill was May 17.

## 2020-08-04 ENCOUNTER — Telehealth: Payer: Self-pay

## 2020-08-04 NOTE — Telephone Encounter (Signed)
Pt called and states that HYDROcodone-acetaminophen (NORCO) 10-325 MG tablet has not been called in. Please send to [facility] in [place]

## 2020-08-05 NOTE — Telephone Encounter (Signed)
Refilled: 07/06/2020 Last OV: 07/27/2020 Next OV: 10/29/2020

## 2020-08-06 ENCOUNTER — Other Ambulatory Visit (INDEPENDENT_AMBULATORY_CARE_PROVIDER_SITE_OTHER): Payer: Managed Care, Other (non HMO)

## 2020-08-06 ENCOUNTER — Other Ambulatory Visit: Payer: Self-pay

## 2020-08-06 DIAGNOSIS — E871 Hypo-osmolality and hyponatremia: Secondary | ICD-10-CM

## 2020-08-06 DIAGNOSIS — D649 Anemia, unspecified: Secondary | ICD-10-CM

## 2020-08-06 DIAGNOSIS — E876 Hypokalemia: Secondary | ICD-10-CM

## 2020-08-06 DIAGNOSIS — Z9229 Personal history of other drug therapy: Secondary | ICD-10-CM

## 2020-08-06 LAB — BASIC METABOLIC PANEL
BUN: 11 mg/dL (ref 6–23)
CO2: 27 mEq/L (ref 19–32)
Calcium: 9.4 mg/dL (ref 8.4–10.5)
Chloride: 98 mEq/L (ref 96–112)
Creatinine, Ser: 1.09 mg/dL (ref 0.40–1.20)
GFR: 53.66 mL/min — ABNORMAL LOW (ref >60.00)
Glucose, Bld: 97 mg/dL (ref 70–99)
Potassium: 3.3 mEq/L — ABNORMAL LOW (ref 3.5–5.1)
Sodium: 134 mEq/L — ABNORMAL LOW (ref 135–145)

## 2020-08-06 MED ORDER — HYDROCODONE-ACETAMINOPHEN 10-325 MG PO TABS: 1.0000 | ORAL_TABLET | Freq: Four times a day (QID) | ORAL | 0 refills | Status: DC | PRN

## 2020-08-06 NOTE — Telephone Encounter (Signed)
PT is calling in to inform Janett Billow to remind Dr.Tullo to call in her Hydrocodone. She states she will run out over the weekend and would like for it to be sent to [facility]

## 2020-08-08 DIAGNOSIS — E876 Hypokalemia: Secondary | ICD-10-CM | POA: Insufficient documentation

## 2020-08-08 MED ORDER — POTASSIUM CHLORIDE CRYS ER 20 MEQ PO TBCR: 20.0000 meq | EXTENDED_RELEASE_TABLET | Freq: Every day | ORAL | 0 refills | Status: DC

## 2020-08-08 NOTE — Addendum Note (Signed)
Addended by: [name] on: 08/08/2020 09:40 AM   Modules accepted: Orders

## 2020-08-08 NOTE — Progress Notes (Signed)
Recurrent hypokalemia addressed with supplemet

## 2020-08-08 NOTE — Assessment & Plan Note (Signed)
Appears to be chronic given recent limited supplementation .  She is not taking a diuretic .  Will prescribe 20 meq daily going forward.

## 2020-08-09 ENCOUNTER — Telehealth: Payer: Self-pay | Admitting: Internal Medicine

## 2020-08-09 NOTE — Telephone Encounter (Signed)
PT called to return the missed call from [facility]

## 2020-08-09 NOTE — Telephone Encounter (Signed)
Please advise 

## 2020-08-09 NOTE — Telephone Encounter (Signed)
Last sent in 08/06/20 for 30 day supply and another rx sent for 09/05/20 to her pharmacy. Patient needs to contact pharmacy for refills.   Left message to return call.

## 2020-08-10 ENCOUNTER — Other Ambulatory Visit: Payer: Self-pay | Admitting: Internal Medicine

## 2020-08-10 LAB — PROTEIN ELECTROPHORESIS, SERUM
Albumin ELP: 4.4 g/dL (ref 3.8–4.8)
Alpha 1: 0.3 g/dL (ref 0.2–0.3)
Alpha 2: 0.7 g/dL (ref 0.5–0.9)
Beta 2: 0.3 g/dL (ref 0.2–0.5)
Beta Globulin: 0.4 g/dL (ref 0.4–0.6)
Gamma Globulin: 0.8 g/dL (ref 0.8–1.7)
Total Protein: 6.8 g/dL (ref 6.1–8.1)

## 2020-08-10 LAB — SARS-COV-2 SEMI-QUANTITATIVE TOTAL ANTIBODY, SPIKE: SARS COV2 AB, Total Spike Semi QN: >2500 U/mL — ABNORMAL HIGH (ref <0.8)

## 2020-08-10 LAB — IRON,TIBC AND FERRITIN PANEL
%SAT: 32 % (calc) (ref 16–45)
Ferritin: 41 ng/mL (ref 16–288)
Iron: 108 ug/dL (ref 45–160)
TIBC: 341 mcg/dL (calc) (ref 250–450)

## 2020-08-10 MED ORDER — HYDROXYZINE PAMOATE 25 MG PO CAPS: 25.0000 mg | ORAL_CAPSULE | Freq: Three times a day (TID) | ORAL | 0 refills | Status: DC | PRN

## 2020-08-10 NOTE — Telephone Encounter (Signed)
Patient returned office phone call. 

## 2020-08-10 NOTE — Telephone Encounter (Signed)
LMTCB

## 2020-08-10 NOTE — Telephone Encounter (Signed)
See previous message. Pt has already picked up medication.

## 2020-08-10 NOTE — Telephone Encounter (Signed)
See previous message

## 2020-08-10 NOTE — Telephone Encounter (Signed)
Spoke with pt and she stated that she has already picked up the rx.

## 2020-08-13 ENCOUNTER — Ambulatory Visit: Payer: Managed Care, Other (non HMO) | Admitting: Family Medicine

## 2020-08-25 ENCOUNTER — Ambulatory Visit (INDEPENDENT_AMBULATORY_CARE_PROVIDER_SITE_OTHER): Payer: Managed Care, Other (non HMO) | Admitting: Dermatology

## 2020-08-25 ENCOUNTER — Other Ambulatory Visit: Payer: Self-pay

## 2020-08-25 DIAGNOSIS — S41001A Unspecified open wound of right shoulder, initial encounter: Secondary | ICD-10-CM

## 2020-08-25 DIAGNOSIS — L821 Other seborrheic keratosis: Secondary | ICD-10-CM

## 2020-08-25 DIAGNOSIS — T148XXA Other injury of unspecified body region, initial encounter: Secondary | ICD-10-CM

## 2020-08-25 MED ORDER — MUPIROCIN 2 % EX OINT: 1.0000 "application " | TOPICAL_OINTMENT | Freq: Every day | CUTANEOUS | 0 refills | Status: DC

## 2020-08-25 NOTE — Patient Instructions (Signed)

## 2020-08-25 NOTE — Progress Notes (Signed)
   Follow-Up Visit   Subjective  Adrienne Robinson is a 65 y.o. female who presents for the following: Skin Problem (Patient here today with a spot at right shoulder, present for about 3 days. Patient advises it does feel like a burn and is treating with lotrimin. Patient does sleep with heating pad on a timer. ).  The following portions of the chart were reviewed this encounter and updated as appropriate:   Tobacco  Allergies  Meds  Problems  Med Hx  Surg Hx  Fam Hx      Review of Systems:  No other skin or systemic complaints except as noted in HPI or Assessment and Plan.  Objective  Well appearing patient in no apparent distress; mood and affect are within normal limits.  A focused examination was performed including arms, shoulder, chest. Relevant physical exam findings are noted in the Assessment and Plan.  Right Shoulder - Anterior Superficial ulceration without surrounding erythema   Assessment & Plan  Open wound Right Shoulder - Anterior  C/w burn wound  Start mupirocin daily and cover with bandage until completely healed  Recommend not falling asleep with heating pad; avoid higher levels of heat on heating pad  mupirocin ointment (BACTROBAN) 2 % - Right Shoulder - Anterior Apply 1 application topically daily. With dressing changes  Seborrheic Keratoses - Stuck-on, waxy, tan-brown papules and/or plaques  - Benign-appearing - Discussed benign etiology and prognosis. - Observe - Call for any changes  Return for TBSE, as scheduled.  Graciella Belton, RMA, am acting as scribe for Forest Gleason, MD .  Documentation: I have reviewed the above documentation for accuracy and completeness, and I agree with the above.  Forest Gleason, MD

## 2020-08-25 NOTE — Progress Notes (Signed)
Entered in error

## 2020-09-03 ENCOUNTER — Other Ambulatory Visit: Payer: Self-pay | Admitting: Internal Medicine

## 2020-09-03 MED ORDER — PREDNISONE 10 MG PO TABS: ORAL_TABLET | ORAL | 0 refills | Status: DC

## 2020-09-03 MED ORDER — TIZANIDINE HCL 4 MG PO TABS
4.0000 mg | ORAL_TABLET | Freq: Four times a day (QID) | ORAL | 0 refills | Status: DC | PRN
Start: 2020-09-03 — End: 2020-09-28

## 2020-09-06 ENCOUNTER — Encounter: Payer: Self-pay | Admitting: Dermatology

## 2020-09-13 ENCOUNTER — Telehealth: Payer: Self-pay | Admitting: Internal Medicine

## 2020-09-13 NOTE — Telephone Encounter (Signed)
Patient states that this a persistent problem for her so she wanted to know if she can come in and have her sodium checked prior to 7/27 appt. She says that the only thing that she has noticed is different is that her hands and feet are tingling. This is non specific to her sodium being low but she says this happened the last time it was low. Other wise she is feeling ok. She is aware that Dr Derrel Nip is out of office until tomorrow PM. She also noted while on the phone that she has ordered some sodium chloride tablets from the pharmacy but will hold on starting until she talks with Dr Derrel Nip. There is a BMP ordered future in her chart. Ok to have her come in for non fasting lab tomorrow? Sending this to doc of day and Dr Derrel Nip since she is out of office.

## 2020-09-13 NOTE — Telephone Encounter (Signed)
Call patient Yes I agree Adrienne Robinson please advise patient to hold on any sodium chloride tablets until labs complete. There is a future BMP lab order there from Dr Derrel Nip.  Please schedule patient for tomorrow or the next day.

## 2020-09-13 NOTE — Telephone Encounter (Signed)
Pt would like to have labs done before her appt tomorrow she thinks her sodium is low Pt is aware Dr. Derrel Nip is out of the office

## 2020-09-14 ENCOUNTER — Other Ambulatory Visit: Payer: Self-pay

## 2020-09-14 ENCOUNTER — Other Ambulatory Visit (INDEPENDENT_AMBULATORY_CARE_PROVIDER_SITE_OTHER): Payer: Managed Care, Other (non HMO)

## 2020-09-14 DIAGNOSIS — E871 Hypo-osmolality and hyponatremia: Secondary | ICD-10-CM

## 2020-09-14 DIAGNOSIS — E876 Hypokalemia: Secondary | ICD-10-CM

## 2020-09-14 LAB — BASIC METABOLIC PANEL
BUN: 19 mg/dL (ref 6–23)
CO2: 23 mEq/L (ref 19–32)
Calcium: 9.1 mg/dL (ref 8.4–10.5)
Chloride: 99 mEq/L (ref 96–112)
Creatinine, Ser: 0.85 mg/dL (ref 0.40–1.20)
GFR: 72.27 mL/min (ref >60.00)
Glucose, Bld: 88 mg/dL (ref 70–99)
Potassium: 3.9 mEq/L (ref 3.5–5.1)
Sodium: 131 mEq/L — ABNORMAL LOW (ref 135–145)

## 2020-09-14 NOTE — Telephone Encounter (Signed)
Per chart, patient scheduled for labs.

## 2020-09-14 NOTE — Telephone Encounter (Signed)
LMTCB. Need to schedule non fasting lab this PM.

## 2020-09-15 ENCOUNTER — Encounter: Payer: Self-pay | Admitting: Internal Medicine

## 2020-09-15 ENCOUNTER — Telehealth (INDEPENDENT_AMBULATORY_CARE_PROVIDER_SITE_OTHER): Payer: Managed Care, Other (non HMO) | Admitting: Internal Medicine

## 2020-09-15 VITALS — Ht 66.0 in | Wt 122.0 lb

## 2020-09-15 DIAGNOSIS — M47812 Spondylosis without myelopathy or radiculopathy, cervical region: Secondary | ICD-10-CM

## 2020-09-15 DIAGNOSIS — M4722 Other spondylosis with radiculopathy, cervical region: Secondary | ICD-10-CM

## 2020-09-15 DIAGNOSIS — E871 Hypo-osmolality and hyponatremia: Secondary | ICD-10-CM

## 2020-09-15 DIAGNOSIS — F418 Other specified anxiety disorders: Secondary | ICD-10-CM | POA: Diagnosis not present

## 2020-09-15 MED ORDER — SODIUM CHLORIDE 1 G PO TABS: 1.0000 g | ORAL_TABLET | Freq: Two times a day (BID) | ORAL | 2 refills | Status: AC

## 2020-09-15 MED ORDER — CELECOXIB 100 MG PO CAPS: 100.0000 mg | ORAL_CAPSULE | Freq: Two times a day (BID) | ORAL | 2 refills | Status: DC

## 2020-09-15 MED ORDER — PREDNISONE 10 MG PO TABS: ORAL_TABLET | ORAL | 0 refills | Status: DC

## 2020-09-15 NOTE — Assessment & Plan Note (Signed)
Mood and affect are surprisingly upbeat despite the stress she is feeling.  No changes to regimen

## 2020-09-15 NOTE — Assessment & Plan Note (Signed)
Adding 2 grams sodium daily.  Repeat BMET one week  Lab Results  Component Value Date   NA 131 (L) 09/14/2020   K 3.9 09/14/2020   CL 99 09/14/2020   CO2 23 09/14/2020

## 2020-09-15 NOTE — Patient Instructions (Signed)
Prednisone prolonged (8  day ) taper  DO NOT START CELEBREX UNTIL AFTER YOU HAVE FINISHED PREDNISONE  1 GRAM SODIUM TABLET TWICE DAILY   REPEAT SODIUM LEVEL IN ONE WEEK

## 2020-09-15 NOTE — Progress Notes (Signed)
Virtual Visit via caregility  Note  This visit type was conducted due to national recommendations for restrictions regarding the COVID-19 pandemic (e.g. social distancing).  This format is felt to be most appropriate for this patient at this time.  All issues noted in this document were discussed and addressed.  No physical exam was performed (except for noted visual exam findings with Video Visits).   I connected withNAME@ on 09/15/20 at  4:30 PM EDT by a video enabled telemedicine application  and verified that I am speaking with the correct person using two identifiers. Location patient: home Location provider: work or home office Persons participating in the virtual visit: patient, provider  I discussed the limitations, risks, security and privacy concerns of performing an evaluation and management service by telephone and the availability of in person appointments. I also discussed with the patient that there may be a patient responsible charge related to this service. The patient expressed understanding and agreed to proceed.   Reason for visit:joint pain   HPI:  65 yr old female with DJD involving shoulders and spine with previous  orthopedic procedures  presents with new onset posterior neck pain accompanied by swelling of paraspinous muscles and tingling of right hand. Symptoms started 4 days ago .   Her chronic pain is managed with hydrocodone  4 times daily .  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   Prepatellar effusion of right knee 06/09/2019   Aspiration done June 13, 2019   Scoliosis    Spinal stenosis of lumbar region    Squamous cell carcinoma of skin 06/24/2018   R lateral calf   Tubular adenoma of colon     Past Surgical History:  Procedure Laterality Date   ABDOMINAL HYSTERECTOMY     APPENDECTOMY     CARDIAC CATHETERIZATION     normal coronaries, no wall motion abnormalities 11/02/09 [facility])   NASAL SINUS SURGERY     OOPHORECTOMY     RADIOLOGY WITH ANESTHESIA  02/26/2012   Procedure: RADIOLOGY WITH ANESTHESIA;  Surgeon: [name], MD;  Location: [place];  Service: Radiology;  Laterality: N/A;   REVERSE SHOULDER ARTHROPLASTY Right 06/27/2013   Procedure: REVERSE RIGHT TOTAL SHOULDER ARTHROPLASTY;  Surgeon: [name], MD;  Location: [place];  Service: Orthopedics;  Laterality: Right;   SHOULDER ARTHROSCOPY WITH ROTATOR CUFF REPAIR AND SUBACROMIAL DECOMPRESSION  2007   left   TONSILLECTOMY  adnoids  Family History  Problem Relation Age of Onset   Colon cancer Father 67   Diabetes Father    Heart disease Father    Rectal cancer Father    Thyroid disease Mother    Colon cancer Paternal Grandfather    Colon cancer Other        pggm   Breast cancer Paternal Grandmother    Stomach cancer Neg Hx     SOCIAL HX. reports that she has never smoked. She has never used smokeless tobacco. She reports current alcohol use of about 2.0 standard drinks of alcohol per week. She reports that she does not use drugs.     Current Outpatient Medications:    amLODipine (NORVASC) 10 MG tablet, TAKE 1 TABLET BY MOUTH EVERY DAY (Patient taking differently: Take 10 mg by mouth daily. TAKE 1 TABLET BY MOUTH EVERY DAY), Disp: 90 tablet, Rfl: 1   buPROPion (WELLBUTRIN XL) 150 MG 24 hr tablet, Take 1 tablet (150 mg total) by mouth every morning., Disp: 90 tablet, Rfl: 1   [START ON 09/24/2020] celecoxib (CELEBREX) 100 MG capsule, Take 1 capsule (100 mg total) by mouth 2 (two) times daily., Disp: 60 capsule, Rfl: 2   Cholecalciferol (VITAMIN D3) 50 MCG (2000 UT) CAPS, Take 1 capsule by mouth daily., Disp: , Rfl:    cyanocobalamin (,VITAMIN B-12,) 1000 MCG/ML injection, INJECT 1 ML (1,000 MCG TOTAL) INTO THE MUSCLE ONCE A WEEK. (Patient taking differently: Inject 1,000 mcg into the muscle once a week. Usually sundays), Disp: 12 mL, Rfl: 6   dicyclomine (BENTYL) 20 MG tablet, TAKE 1 TABLET (20 MG TOTAL) BY MOUTH 3 (THREE) TIMES DAILY BEFORE MEALS., Disp: 60 tablet, Rfl: 1   esomeprazole (NEXIUM) 20 MG capsule, Take 20 mg by mouth daily at 12 noon., Disp: , Rfl:    fluticasone (FLONASE) 50 MCG/ACT nasal spray, USE 2 SPRAYS IN EACH NOSTRIL DAILY AS NEEDED (Patient taking differently: Place 2 sprays into both nostrils daily as needed for allergies or rhinitis. USE 2 SPRAYS IN EACH NOSTRIL DAILY AS NEEDED), Disp: 16 g, Rfl: 1   HYDROcodone-acetaminophen (NORCO) 10-325 MG tablet, Take 1 tablet by mouth every 6 (six) hours as needed for severe pain., Disp: 120 tablet, Rfl: 0   [START ON 10/05/2020] HYDROcodone-acetaminophen (NORCO) 10-325 MG tablet, Take 1 tablet by mouth every 6 (six) hours as needed for severe pain., Disp: 120 tablet, Rfl: 0   hydrOXYzine (VISTARIL) 25 MG capsule, TAKE 1 CAPSULE (25 MG TOTAL) BY MOUTH EVERY 8 (EIGHT) HOURS AS NEEDED., Disp: 30 capsule, Rfl: 0   KLOR-CON M20 20 MEQ tablet, TAKE 1 TABLET BY MOUTH EVERY DAY, Disp: 90 tablet, Rfl: 0   linaclotide (LINZESS) 145 MCG CAPS capsule, Take 1 capsule (145 mcg  total) by mouth daily before breakfast. MUST KEEP 10/21/20 APPT FOR FURTHER REFILLS, Disp: 30 capsule, Rfl: 2   metoprolol succinate (TOPROL-XL) 25 MG 24 hr tablet, TAKE 1 TABLET BY MOUTH EVERY DAY (Patient taking differently: Take 25 mg by mouth every morning.), Disp: 90 tablet, Rfl: 1   Multiple Vitamin (MULTIVITAMIN) tablet, Take 1 tablet by mouth daily., Disp: , Rfl:    predniSONE (DELTASONE) 10 MG tablet, 6 tablets daily for 3 days, then reduce by 1 tablet daily until gone, Disp: 33 tablet, Rfl: 0   sodium chloride 1 g tablet, Take 1 tablet (1 g total) by mouth 2 (two) times daily with a meal., Disp: 60 tablet, Rfl: 2   Syringe/Needle, Disp, (SYRINGE 3CC/25GX1") 25G X 1" 3  ML MISC, Use for b12 injections, Disp: 50 each, Rfl: 0   tiZANidine (ZANAFLEX) 4 MG tablet, Take 1 tablet (4 mg total) by mouth every 6 (six) hours as needed for muscle spasms., Disp: 30 tablet, Rfl: 0   zolpidem (AMBIEN) 10 MG tablet, TAKE 1 TABLET (10 MG TOTAL) BY MOUTH AT BEDTIME AS NEEDED. FOR SLEEP, Disp: 30 tablet, Rfl: 2   predniSONE (DELTASONE) 10 MG tablet, 6 tablets on Day 1 , then reduce by 1 tablet daily until gone (Patient not taking: Reported on 09/15/2020), Disp: 21 tablet, Rfl: 0  EXAM:  VITALS per patient if applicable:  GENERAL: alert, oriented, appears well and in no acute distress  HEENT: atraumatic, conjunttiva clear, no obvious abnormalities on inspection of external nose and ears  NECK: normal movements of the head and neck  LUNGS: on inspection no signs of respiratory distress, breathing rate appears normal, no obvious gross SOB, gasping or wheezing  CV: no obvious cyanosis  MS: moves all visible extremities without noticeable abnormality  PSYCH/NEURO: pleasant and cooperative, no obvious depression or anxiety, speech and thought processing grossly intact  ASSESSMENT AND PLAN:  Discussed the following assessment and plan:  Hyponatremia - Plan: Basic metabolic panel  Osteoarthritis of  spine with radiculopathy, cervical region - Plan: For home use only DME Other see comment  Cervical spondylosis without myelopathy  Anxiety associated with depression  Hyponatremia Adding 2 grams sodium daily.  Repeat BMET one week  Lab Results  Component Value Date   NA 131 (L) 09/14/2020   K 3.9 09/14/2020   CL 99 09/14/2020   CO2 23 09/14/2020     Cervical spondylosis without myelopathy With new onset swelling of neck muscles and right arm subtle weakness with tingling (reported by patient).  Neck support,  Prednisone taper   Anxiety associated with depression Mood and affect are surprisingly upbeat despite the stress she is feeling.  No changes to regimen      I discussed the assessment and treatment plan with the patient. The patient was provided an opportunity to ask questions and all were answered. The patient agreed with the plan and demonstrated an understanding of the instructions.   The patient was advised to call back or seek an in-person evaluation if the symptoms worsen or if the condition fails to improve as anticipated.   I spent 30 minutes dedicated to the care of this patient on the date of this encounter to include pre-visit review of her  medical history,  including previous CT spine,  controlled substance daabase,  Face-to-face time with the patient , and post visit ordering of testing and therapeutics.    [name], MD

## 2020-09-15 NOTE — Assessment & Plan Note (Signed)
With new onset swelling of neck muscles and right arm subtle weakness with tingling (reported by patient).  Neck support,  Prednisone taper

## 2020-09-16 ENCOUNTER — Telehealth: Payer: Self-pay

## 2020-09-16 NOTE — Telephone Encounter (Signed)
PA for Celecoxib has been approved.   Key: [id number]  Attempted to contact [place] to inform her of approved prior authorization. Left a message to call back.

## 2020-09-17 ENCOUNTER — Telehealth: Payer: Self-pay | Admitting: Internal Medicine

## 2020-09-17 NOTE — Telephone Encounter (Signed)
Called and informed Adrienne Robinson that her Celebrex PA was approved. [name] verbalized understanding and then asked if she should continue taking her potassium as her recent labs came back normal.

## 2020-09-17 NOTE — Telephone Encounter (Signed)
Patient is unsure if she should still take her potassium pills.Please advise.

## 2020-09-17 NOTE — Telephone Encounter (Signed)
Pt aware that she can stop the potassium supplement.

## 2020-09-17 NOTE — Telephone Encounter (Signed)
See Telephone note for 09/16/2020

## 2020-09-17 NOTE — Telephone Encounter (Signed)
Patient is returning your call from earlier. 

## 2020-09-24 DIAGNOSIS — E871 Hypo-osmolality and hyponatremia: Secondary | ICD-10-CM

## 2020-09-26 MED ORDER — FUROSEMIDE 20 MG PO TABS: 20.0000 mg | ORAL_TABLET | Freq: Every day | ORAL | 3 refills | Status: DC

## 2020-09-27 ENCOUNTER — Telehealth: Payer: Self-pay

## 2020-09-27 NOTE — Telephone Encounter (Signed)
Pt called to see if she can use Cholesterol: 2% and Lovastatin: 2% Cream on her face and chest, Dr Nicole Kindred prescribed this topical for porokeratosis on her arms and legs

## 2020-09-28 ENCOUNTER — Encounter: Payer: Self-pay | Admitting: Internal Medicine

## 2020-09-28 ENCOUNTER — Telehealth: Payer: Self-pay | Admitting: Internal Medicine

## 2020-09-28 ENCOUNTER — Telehealth: Payer: Self-pay

## 2020-09-28 ENCOUNTER — Ambulatory Visit (INDEPENDENT_AMBULATORY_CARE_PROVIDER_SITE_OTHER): Payer: Managed Care, Other (non HMO) | Admitting: Internal Medicine

## 2020-09-28 VITALS — BP 132/80 | HR 65 | Ht 66.0 in | Wt 125.2 lb

## 2020-09-28 DIAGNOSIS — R140 Abdominal distension (gaseous): Secondary | ICD-10-CM

## 2020-09-28 DIAGNOSIS — K589 Irritable bowel syndrome without diarrhea: Secondary | ICD-10-CM | POA: Diagnosis not present

## 2020-09-28 DIAGNOSIS — K5904 Chronic idiopathic constipation: Secondary | ICD-10-CM

## 2020-09-28 DIAGNOSIS — K219 Gastro-esophageal reflux disease without esophagitis: Secondary | ICD-10-CM | POA: Diagnosis not present

## 2020-09-28 MED ORDER — LINACLOTIDE 145 MCG PO CAPS: 145.0000 ug | ORAL_CAPSULE | Freq: Every day | ORAL | 1 refills | Status: DC

## 2020-09-28 MED ORDER — RIFAXIMIN 550 MG PO TABS: 550.0000 mg | ORAL_TABLET | Freq: Three times a day (TID) | ORAL | 0 refills | Status: DC

## 2020-09-28 NOTE — Progress Notes (Signed)
Subjective:    Patient ID: Adrienne Robinson, female    DOB: Sep 18, 1955, 65 y.o.   MRN: [id number]  HPI Adrienne Robinson is a 65 year old female with a history of GERD, adenomatous colon polyps, IBS versus SIBO, chronic constipation, significant family history of colon cancer (great-grandmother, grandfather and father all on the same side), history of brain AVM treated with embolization in 2014, history of depression who is here for follow-up.  Last seen in June 2021.  She reports that on the whole from a GI perspective she is doing pretty good.  She feels tired and stressed.  Her husband has recently started peritoneal dialysis and this is going well.  She loves her job and she is working with Therapist, occupational.  She is using Linzess 145 mcg daily.  Stools vary from constipation to at times loose and watery.  Some days they are normal.  She is using the Linzess on a daily basis.  She wonders if she needs a higher dose.  Her abdominal bloating and mild abdominal discomfort comes and goes can last 4 to 7 days and then be gone for some time.  She is using Nexium which she has reduced to 20 mg once daily.  Even with this sometimes she can feel indigestion which for her makes her feel "really bad".  No dysphagia or odynophagia on a regular basis.  No blood in stool or melena.  Of note she was admitted to the hospital earlier this year with significant hyponatremia.  Unclear cause.  Apparently she was not hypotensive nor having renal dysfunction.  She was not on thiazide diuretic.  She has been started on sodium tablets and has noticed some mild lower extremity edema for which primary care recently prescribed furosemide.  She has not taken this yet.  She is somewhat worried that low sodium will occur again.  When she went to the hospital she was having tingling in her arms and legs and just felt poorly.  Review of Systems As per HPI, otherwise negative  Current Medications, Allergies, Past Medical History, Past  Surgical History, Family History and Social History were reviewed in [employer] record.    Objective:   Physical Exam BP 132/80   Pulse 65   Ht '5\' 6"'$  (1.676 m)   Wt 125 lb 3.2 oz (56.8 kg)   SpO2 98%   BMI 20.21 kg/m  Gen: awake, alert, NAD HEENT: anicteric, op clear CV: RRR, no mrg Pulm: CTA b/l Abd: soft, NT/ND, +BS throughout Ext: no c/c/e Neuro: nonfocal      Assessment & Plan:  65 year old female with a history of GERD, adenomatous colon polyps, IBS versus SIBO, chronic constipation, significant family history of colon cancer (great-grandmother, grandfather and father all on the same side), history of brain AVM treated with embolization in 2014, history of depression who is here for follow-up.   Chronic constipation --Linzess has been helpful for her.  She wonders if at times due to constipation she needs a higher dose.  I am going to refill the dose at 145 mcg daily but she can experiment with taking 2 capsules if she feels necessary.  If the higher dose does work better we can increase to 290 mcg on a daily basis.  We did discuss how some patients seem to get mild tachyphylaxis with Linzess and so stopping it for 4 to 7 days and then resuming it may improve efficacy. --Linzess 145 mcg daily; she may try this 2 capsules to see  if the higher dose is more effective  2.  IBS/SIBO --definitive response to rifaximin therapy in the past.  She is having some return of bloating and abdominal discomfort.  We will retreat with rifaximin --Rifaximin 550 mg 3 times daily x14 days  3.  GERD/indigestion --previously on Nexium 40 mg twice daily she backed down to 40 once daily and now is on 20 mg once daily.  Some breakthrough symptoms.  She can take 20 mg twice daily AC as needed --Nexium 20 mg every morning, a second dose in the evening when needed  4.  CRC screening/family history of colon cancer --surveillance colonoscopy recommended May 2023  5.  Hyponatremia  --we discussed this symptom today.  Is unclear to me why she became so hyponatremic.  We discussed that there is an algorithmic work-up for this including checking urine sodium and other metabolic tests.  She will discuss this further with Dr. Derrel Nip.  30 minutes total spent today including patient facing time, coordination of care, reviewing medical history/procedures/pertinent radiology studies, and documentation of the encounter.

## 2020-09-28 NOTE — Telephone Encounter (Signed)
Patient coming for visit today. Will discuss at office visit.

## 2020-09-28 NOTE — Patient Instructions (Addendum)
We have sent the following medications to your pharmacy for you to pick up at your convenience: Linzess 145 mcg once daily Xifaxan   Continue Nexium 20 mg--Take 1 to 2 times daily (over the counter).  If you are age 65 or younger, your body mass index should be between 19-25. Your Body mass index is 20.21 kg/m. If this is out of the aformentioned range listed, please consider follow up with your Primary Care Provider.   __________________________________________________________  The [place] providers would like to encourage you to use [facility] to communicate with providers for non-urgent requests or questions.  Due to long hold times on the telephone, sending your provider a message by [facility] may be a faster and more efficient way to get a response.  Please allow 48 business hours for a response.  Please remember that this is for non-urgent requests.   Due to recent changes in healthcare laws, you may see the results of your imaging and laboratory studies on MyChart before your provider has had a chance to review them.  We understand that in some cases there may be results that are confusing or concerning to you. Not all laboratory results come back in the same time frame and the provider may be waiting for multiple results in order to interpret others.  Please give [place] 48 hours in order for your provider to thoroughly review all the results before contacting the office for clarification of your results.

## 2020-09-28 NOTE — Telephone Encounter (Signed)
Inbound call from patient requesting medication refill for 90 day Linzess to [facility] in [place]. Have appt 9/1

## 2020-09-28 NOTE — Telephone Encounter (Signed)
Called pt discussed using  Cholesterol: 2% and Lovastatin: 2% Cream on the chest and face, It should not hurt, but would just recommend it for Porokeratoses on arms and legs

## 2020-09-28 NOTE — Progress Notes (Signed)
[facility] [place] Phone: [phone] Subjective:   Adrienne Robinson, am serving as a scribe for Dr. Hulan Saas. This visit occurred during the SARS-CoV-2 public health emergency.  Safety protocols were in place, including screening questions prior to the visit, additional usage of staff PPE, and extensive cleaning of exam room while observing appropriate contact time as indicated for disinfecting solutions.   I'm seeing this patient by the request  of:  [name], MD  CC: thumb pain back pain   [id number]  Adrienne Robinson is a 65 y.o. female coming in with complaint of L shoulder pain. Trigger point injection given in 2020. Patient last seen in 2021 for OMT. Patient states that her neck is tight and she is having pain in R thoracic spine that radiates into the lumbar spine. Tried prednisone and this did not make a difference. Start Celebrex and this is not helping either. Pain worse in evenings after sitting at desk all day long. Has had lower back epidural and she feels she needs another one.   Would also like injection in L thumb. Pain with use. Would like injection today.       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   Prepatellar effusion of right knee 06/09/2019   Aspiration done June 13, 2019   Scoliosis    Spinal stenosis of lumbar region    Squamous cell carcinoma of skin 06/24/2018   R lateral calf   Tubular adenoma of colon    Past Surgical History:  Procedure Laterality Date   ABDOMINAL HYSTERECTOMY     APPENDECTOMY     CARDIAC CATHETERIZATION      normal coronaries, Robinson wall motion abnormalities 11/02/09 [facility])   NASAL SINUS SURGERY     OOPHORECTOMY     RADIOLOGY WITH ANESTHESIA  02/26/2012   Procedure: RADIOLOGY WITH ANESTHESIA;  Surgeon: [name], MD;  Location: [place];  Service: Radiology;  Laterality: N/A;   REVERSE SHOULDER ARTHROPLASTY Right 06/27/2013   Procedure: REVERSE RIGHT TOTAL SHOULDER ARTHROPLASTY;  Surgeon: [name], MD;  Location: [place];  Service: Orthopedics;  Laterality: Right;   SHOULDER ARTHROSCOPY WITH ROTATOR CUFF REPAIR AND SUBACROMIAL DECOMPRESSION  2007   left   TONSILLECTOMY  adnoids   Social History   Socioeconomic History   Marital status: Married    Spouse name: Not on file   Number of children: 0   Years of education: 18   Highest education level: Not on file  Occupational History   Occupation: Nurse executive   Tobacco Use   Smoking status: Never   Smokeless tobacco: Never  Vaping Use   Vaping Use: Never used  Substance and Sexual Activity   Alcohol use: Yes    Alcohol/week: 2.0 standard drinks    Types: 2 Glasses of wine per week    Comment: 2-3 week with dinner   Drug use: Robinson   Sexual activity: Not on file  Other Topics Concern   Not on file  Social History Narrative  Lives at home with her husband.   Right-handed.   2-3 diet cokes per day.   Social Determinants of Health   Financial Resource Strain: Not on file  Food Insecurity: Not on file  Transportation Needs: Not on file  Physical Activity: Not on file  Stress: Not on file  Social Connections: Not on file   Allergies  Allergen Reactions   Buspirone Other (See Comments)    Dizziness,  dry mouth    Adhesive [Tape] Rash and Other (See Comments)    Redness, blisters, skin peeling off.   Morphine And Related Rash   Family History  Problem Relation Age of Onset   Colon cancer Father 56   Diabetes Father    Heart disease Father    Rectal cancer Father    Thyroid disease Mother    Colon cancer Paternal  Grandfather    Colon cancer Other        pggm   Breast cancer Paternal Grandmother    Stomach cancer Neg Hx      Current Outpatient Medications (Cardiovascular):    amLODipine (NORVASC) 10 MG tablet, Take 10 mg by mouth daily.   furosemide (LASIX) 20 MG tablet, Take 1 tablet (20 mg total) by mouth daily with breakfast. As needed for edema   metoprolol succinate (TOPROL-XL) 25 MG 24 hr tablet, TAKE 1 TABLET BY MOUTH EVERY DAY (Patient taking differently: Take 25 mg by mouth every morning.)  Current Outpatient Medications (Respiratory):    fluticasone (FLONASE) 50 MCG/ACT nasal spray, USE 2 SPRAYS IN EACH NOSTRIL DAILY AS NEEDED (Patient taking differently: Place 2 sprays into both nostrils daily as needed for allergies or rhinitis. USE 2 SPRAYS IN EACH NOSTRIL DAILY AS NEEDED)  Current Outpatient Medications (Analgesics):    celecoxib (CELEBREX) 100 MG capsule, Take 1 capsule (100 mg total) by mouth 2 (two) times daily.   HYDROcodone-acetaminophen (NORCO) 10-325 MG tablet, Take 1 tablet by mouth every 6 (six) hours as needed for severe pain.  Current Outpatient Medications (Hematological):    cyanocobalamin (,VITAMIN B-12,) 1000 MCG/ML injection, INJECT 1 ML (1,000 MCG TOTAL) INTO THE MUSCLE ONCE A WEEK. (Patient taking differently: Inject 1,000 mcg into the muscle once a week. Usually sundays)  Current Outpatient Medications (Other):    buPROPion (WELLBUTRIN XL) 150 MG 24 hr tablet, Take 1 tablet (150 mg total) by mouth every morning.   Cholecalciferol (VITAMIN D3) 50 MCG (2000 UT) CAPS, Take 1 capsule by mouth daily.   dicyclomine (BENTYL) 20 MG tablet, TAKE 1 TABLET (20 MG TOTAL) BY MOUTH 3 (THREE) TIMES DAILY BEFORE MEALS.   esomeprazole (NEXIUM) 20 MG capsule, Take 20 mg by mouth daily at 12 noon.   hydrOXYzine (VISTARIL) 25 MG capsule, TAKE 1 CAPSULE (25 MG TOTAL) BY MOUTH EVERY 8 (EIGHT) HOURS AS NEEDED.   linaclotide (LINZESS) 145 MCG CAPS capsule, Take 1 capsule (145 mcg total)  by mouth daily before breakfast.   Multiple Vitamin (MULTIVITAMIN) tablet, Take 1 tablet by mouth daily.   Potassium Chloride (KLOR-CON 10 PO), Take by mouth See admin instructions. Take 1 tablet in the morning and 1 tablet at night   rifaximin (XIFAXAN) 550 MG TABS tablet, Take 1 tablet (550 mg total) by mouth 3 (three) times daily.   sodium chloride 1 g tablet, Take 1 tablet (1 g total) by mouth 2 (two) times daily with a meal.   Syringe/Needle, Disp, (SYRINGE 3CC/25GX1") 25G X 1" 3 ML MISC, Use for b12 injections   zolpidem (AMBIEN) 10  MG tablet, TAKE 1 TABLET (10 MG TOTAL) BY MOUTH AT BEDTIME AS NEEDED. FOR SLEEP   Reviewed prior external information including notes and imaging from  primary care provider As well as notes that were available from care everywhere and other healthcare systems.  Past medical history, social, surgical and family history all reviewed in electronic medical record.  Robinson pertanent information unless stated regarding to the chief complaint.   Review of Systems:  Robinson headache, visual changes, nausea, vomiting, diarrhea, constipation, dizziness, abdominal pain, skin rash, fevers, chills, night sweats, weight loss, swollen lymph nodes, body aches, joint swelling, chest pain, shortness of breath, mood changes. POSITIVE muscle aches  Objective  Blood pressure 124/84, pulse 71, height '5\' 6"'$  (1.676 m), weight 127 lb (57.6 kg), SpO2 98 %.   General: Robinson apparent distress alert and oriented x3 mood and affect normal, dressed appropriately.  HEENT: Pupils equal, extraocular movements intact  Respiratory: Patient's speak in full sentences and does not appear short of breath  Cardiovascular: Robinson lower extremity edema, non tender, Robinson erythema  Gait normal with good balance and coordination.  MSK: Low back exam does show the patient does have some tenderness to palpation in the paraspinal musculature with worsening pain with extension greater than 10 degrees.  Patient does have  tightness with FABER test.  Patient does have mild worsening pain with extension of the hamstrings bilaterally mild weakness with atrophy of legs   Severe arthritic changes of the left thumb noted with positive grind test.  Significant wasting noted of the thenar eminence  Procedure: Real-time Ultrasound Guided Injection of left CMC Device: GE Logiq Q7 Ultrasound guided injection is preferred based studies that show increased duration, increased effect, greater accuracy, decreased procedural pain, increased response rate, and decreased cost with ultrasound guided versus blind injection.  Verbal informed consent obtained.  Time-out conducted.  Noted Robinson overlying erythema, induration, or other signs of local infection.  Skin prepped in a sterile fashion.  Local anesthesia: Topical Ethyl chloride.  With sterile technique and under real time ultrasound guidance: With a 25-gauge half inch needle injected with 0.5 cc of 0.5% Marcaine and 0.5 cc of Kenalog 40 mg/mL Completed without difficulty  Pain immediately resolved suggesting accurate placement of the medication.  Advised to call if fevers/chills, erythema, induration, drainage, or persistent bleeding.  Impression: Technically successful ultrasound guided injection.  Osteopathic findings T6 E RS right inhaled rib L1 F RS right  L5 F RS left  Sacrum left on left    Impression and Recommendations:     The above documentation has been reviewed and is accurate and complete Lyndal Pulley, DO

## 2020-09-29 ENCOUNTER — Other Ambulatory Visit: Payer: Self-pay

## 2020-09-29 ENCOUNTER — Ambulatory Visit: Payer: Self-pay

## 2020-09-29 ENCOUNTER — Ambulatory Visit (INDEPENDENT_AMBULATORY_CARE_PROVIDER_SITE_OTHER): Payer: Managed Care, Other (non HMO)

## 2020-09-29 ENCOUNTER — Ambulatory Visit (INDEPENDENT_AMBULATORY_CARE_PROVIDER_SITE_OTHER): Payer: Managed Care, Other (non HMO) | Admitting: Family Medicine

## 2020-09-29 VITALS — BP 124/84 | HR 71 | Ht 66.0 in | Wt 127.0 lb

## 2020-09-29 DIAGNOSIS — M545 Low back pain, unspecified: Secondary | ICD-10-CM | POA: Diagnosis not present

## 2020-09-29 DIAGNOSIS — M48061 Spinal stenosis, lumbar region without neurogenic claudication: Secondary | ICD-10-CM | POA: Diagnosis not present

## 2020-09-29 DIAGNOSIS — M9903 Segmental and somatic dysfunction of lumbar region: Secondary | ICD-10-CM

## 2020-09-29 DIAGNOSIS — G8929 Other chronic pain: Secondary | ICD-10-CM | POA: Diagnosis not present

## 2020-09-29 DIAGNOSIS — M25512 Pain in left shoulder: Secondary | ICD-10-CM

## 2020-09-29 DIAGNOSIS — M9902 Segmental and somatic dysfunction of thoracic region: Secondary | ICD-10-CM

## 2020-09-29 DIAGNOSIS — M79645 Pain in left finger(s): Secondary | ICD-10-CM

## 2020-09-29 DIAGNOSIS — M1812 Unilateral primary osteoarthritis of first carpometacarpal joint, left hand: Secondary | ICD-10-CM

## 2020-09-29 DIAGNOSIS — M9904 Segmental and somatic dysfunction of sacral region: Secondary | ICD-10-CM | POA: Diagnosis not present

## 2020-09-29 NOTE — Assessment & Plan Note (Signed)
Severe arthritic changes.  Repeat injection given again today.  Tolerated the procedure well.  Hopefully patient has good response.  Discussed icing regimen and home exercises.  Follow-up again 8 weeks

## 2020-09-29 NOTE — Assessment & Plan Note (Signed)
Stable at moment

## 2020-09-29 NOTE — Assessment & Plan Note (Signed)
History of spinal stenosis.  We will get other x-rays and need new MRI with last one greater than 65 years old.  Depending on level would be a good candidate for injection  responded to OMT F/u 8 weeks

## 2020-09-29 NOTE — Patient Instructions (Addendum)
MRI lumbar [facility] will call you Xray lumbar spine Ok for husband to push on back Stay active Limit ROM where you can See me again in 7-8 weeks

## 2020-09-30 MED ORDER — ESOMEPRAZOLE MAGNESIUM 20 MG PO CPDR: 20.0000 mg | DELAYED_RELEASE_CAPSULE | Freq: Every day | ORAL | 2 refills | Status: DC

## 2020-09-30 NOTE — Addendum Note (Signed)
Addended by: Larina Bras on: 09/30/2020 01:44 PM   Modules accepted: Orders

## 2020-10-07 ENCOUNTER — Encounter: Payer: Self-pay | Admitting: Family Medicine

## 2020-10-08 ENCOUNTER — Other Ambulatory Visit: Payer: Self-pay | Admitting: Internal Medicine

## 2020-10-08 ENCOUNTER — Other Ambulatory Visit: Payer: Self-pay

## 2020-10-08 DIAGNOSIS — M542 Cervicalgia: Secondary | ICD-10-CM

## 2020-10-11 NOTE — Telephone Encounter (Signed)
RX Refill:ambien Last Seen:09-15-20 Last ordered:07-12-20

## 2020-10-12 ENCOUNTER — Ambulatory Visit: Payer: Managed Care, Other (non HMO) | Admitting: Dermatology

## 2020-10-19 MED ORDER — DIAZEPAM 5 MG PO TABS: ORAL_TABLET | ORAL | 0 refills | Status: DC

## 2020-10-19 NOTE — Telephone Encounter (Signed)
See message below for medication request.

## 2020-10-20 ENCOUNTER — Other Ambulatory Visit: Payer: Self-pay | Admitting: Internal Medicine

## 2020-10-20 DIAGNOSIS — E781 Pure hyperglyceridemia: Secondary | ICD-10-CM

## 2020-10-20 NOTE — Progress Notes (Signed)
Fasting labs ordered

## 2020-10-21 ENCOUNTER — Ambulatory Visit: Payer: Managed Care, Other (non HMO) | Admitting: Internal Medicine

## 2020-10-27 ENCOUNTER — Other Ambulatory Visit: Payer: Managed Care, Other (non HMO)

## 2020-10-28 ENCOUNTER — Other Ambulatory Visit (INDEPENDENT_AMBULATORY_CARE_PROVIDER_SITE_OTHER): Payer: Managed Care, Other (non HMO)

## 2020-10-28 ENCOUNTER — Other Ambulatory Visit: Payer: Self-pay

## 2020-10-28 DIAGNOSIS — E781 Pure hyperglyceridemia: Secondary | ICD-10-CM | POA: Diagnosis not present

## 2020-10-28 LAB — LIPID PANEL
Cholesterol: 219 mg/dL — ABNORMAL HIGH (ref 0–200)
HDL: 66.6 mg/dL (ref >39.00)
LDL Cholesterol: 127 mg/dL — ABNORMAL HIGH (ref 0–99)
NonHDL: 151.93
Total CHOL/HDL Ratio: 3
Triglycerides: 123 mg/dL (ref 0.0–149.0)
VLDL: 24.6 mg/dL (ref 0.0–40.0)

## 2020-10-28 LAB — HEPATIC FUNCTION PANEL
ALT: 15 U/L (ref 0–35)
AST: 17 U/L (ref 0–37)
Albumin: 4.4 g/dL (ref 3.5–5.2)
Alkaline Phosphatase: 67 U/L (ref 39–117)
Bilirubin, Direct: 0.1 mg/dL (ref 0.0–0.3)
Total Bilirubin: 0.7 mg/dL (ref 0.2–1.2)
Total Protein: 6.6 g/dL (ref 6.0–8.3)

## 2020-10-29 ENCOUNTER — Encounter: Payer: Self-pay | Admitting: Internal Medicine

## 2020-10-29 ENCOUNTER — Other Ambulatory Visit (INDEPENDENT_AMBULATORY_CARE_PROVIDER_SITE_OTHER): Payer: Managed Care, Other (non HMO)

## 2020-10-29 ENCOUNTER — Telehealth (INDEPENDENT_AMBULATORY_CARE_PROVIDER_SITE_OTHER): Payer: Managed Care, Other (non HMO) | Admitting: Internal Medicine

## 2020-10-29 DIAGNOSIS — E871 Hypo-osmolality and hyponatremia: Secondary | ICD-10-CM | POA: Diagnosis not present

## 2020-10-29 DIAGNOSIS — M48061 Spinal stenosis, lumbar region without neurogenic claudication: Secondary | ICD-10-CM | POA: Diagnosis not present

## 2020-10-29 LAB — BASIC METABOLIC PANEL
BUN: 18 mg/dL (ref 6–23)
CO2: 25 mEq/L (ref 19–32)
Calcium: 9.4 mg/dL (ref 8.4–10.5)
Chloride: 100 mEq/L (ref 96–112)
Creatinine, Ser: 1.03 mg/dL (ref 0.40–1.20)
GFR: 57.34 mL/min — ABNORMAL LOW (ref >60.00)
Glucose, Bld: 84 mg/dL (ref 70–99)
Potassium: 3.8 mEq/L (ref 3.5–5.1)
Sodium: 137 mEq/L (ref 135–145)

## 2020-10-29 MED ORDER — HYDROCODONE-ACETAMINOPHEN 10-325 MG PO TABS: 1.0000 | ORAL_TABLET | Freq: Four times a day (QID) | ORAL | 0 refills | Status: DC | PRN

## 2020-10-29 MED ORDER — PREDNISONE 10 MG PO TABS: ORAL_TABLET | ORAL | 0 refills | Status: DC

## 2020-10-29 NOTE — Progress Notes (Signed)
Virtual Visit via CAregility Note  This visit type was conducted due to national recommendations for restrictions regarding the COVID-19 pandemic (e.g. social distancing).  This format is felt to be most appropriate for this patient at this time.  All issues noted in this document were discussed and addressed.  No physical exam was performed (except for noted visual exam findings with Video Visits).   I connected withNAME@ on 10/29/20 at  8:00 AM EDT by a video enabled telemedicine application  and verified that I am speaking with the correct person using two identifiers. Location patient: home Location provider: work or home office Persons participating in the virtual visit: patient, provider  I discussed the limitations, risks, security and privacy concerns of performing an evaluation and management service by telephone and the availability of in person appointments. I also discussed with the patient that there may be a patient responsible charge related to this service. The patient expressed understanding and agreed to proceed.  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  Hx: On Jul 12 2020 she was admitted to [facility] with AMS and bilateral parasthesias in the setting of extremely low sodium diet,  decreased po intake x 2 weeks and excessive intake of diet soda. . Severe hyponatremia  sodium of 113 potassium 2.9   Per hospital admission she appeared mildly hypovolemic but BP was normal and BUN/CR was 14/0.75 .  Urine osmo was 327,  urine na was 33  .  she was admitted to ICU for 3% saline infusion . given NS infusion overnight ,on May 24 sodium had corrected to 132, serum osmo was low at 251 .  Urine osmo had dropped overnight to 79. Random cortisol was normal at  10.2   mcg/dL. Etiology was suggested to be  SIADH superimposed on mild dehydration , based on elevated urine Na of 33 on admission. She was advised to reduce her diet soda intake Since then she has had  recurrent intermittent mild hyponatremia ranging from 130 to 135 .  Has been taking 2 gram salt tablets since her husband is on a low sodium ESKD diet   2) Chronic neck and back pain : she continues to see [name] for manipulation and occasional steroid injections,  but relies on use of hydrocodone to manage pain . Refill history confirmed via [place] Controlled Substance databas, accessed by me today..     ROS: See pertinent positives and negatives per HPI.  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   Prepatellar effusion of right knee 06/09/2019   Aspiration done June 13, 2019   Scoliosis    Spinal stenosis of lumbar region    Squamous cell carcinoma of skin 06/24/2018   R lateral calf   Tubular adenoma of colon     Past Surgical History:  Procedure Laterality Date  ABDOMINAL HYSTERECTOMY     APPENDECTOMY     CARDIAC CATHETERIZATION     normal coronaries, no wall motion abnormalities 11/02/09 [facility])   NASAL SINUS SURGERY     OOPHORECTOMY     RADIOLOGY WITH ANESTHESIA  02/26/2012   Procedure: RADIOLOGY WITH ANESTHESIA;  Surgeon: [name], MD;  Location: [place];  Service: Radiology;  Laterality: N/A;   REVERSE SHOULDER ARTHROPLASTY Right 06/27/2013   Procedure: REVERSE RIGHT TOTAL SHOULDER ARTHROPLASTY;  Surgeon: [name], MD;  Location: [place];  Service: Orthopedics;  Laterality: Right;   SHOULDER ARTHROSCOPY WITH ROTATOR CUFF REPAIR AND  SUBACROMIAL DECOMPRESSION  2007   left   TONSILLECTOMY  adnoids    Family History  Problem Relation Age of Onset   Colon cancer Father 45   Diabetes Father    Heart disease Father    Rectal cancer Father    Thyroid disease Mother    Colon cancer Paternal Grandfather    Colon cancer Other        pggm   Breast cancer Paternal Grandmother    Stomach cancer Neg Hx     SOCIAL HX.  reports that she has never smoked. She has never used smokeless tobacco. She reports current alcohol use of about 2.0 standard drinks per week. She reports that she does not use drugs.    Current Outpatient Medications:    amLODipine (NORVASC) 10 MG tablet, Take 10 mg by mouth daily., Disp: , Rfl:    buPROPion (WELLBUTRIN XL) 150 MG 24 hr tablet, Take 1 tablet (150 mg total) by mouth every morning., Disp: 90 tablet, Rfl: 1   Cholecalciferol (VITAMIN D3) 50 MCG (2000 UT) CAPS, Take 1 capsule by mouth daily., Disp: , Rfl:    cyanocobalamin (,VITAMIN B-12,) 1000 MCG/ML injection, INJECT 1 ML (1,000 MCG TOTAL) INTO THE MUSCLE ONCE A WEEK. (Patient taking differently: Inject 1,000 mcg into the muscle once a week. Usually sundays), Disp: 12 mL, Rfl: 6   dicyclomine (BENTYL) 20 MG tablet, TAKE 1 TABLET (20 MG TOTAL) BY MOUTH 3 (THREE) TIMES DAILY BEFORE MEALS., Disp: 90 tablet, Rfl: 0   esomeprazole (NEXIUM) 20 MG capsule, Take 1 capsule (20 mg total) by mouth daily., Disp: 30 capsule, Rfl: 2   fluticasone (FLONASE) 50 MCG/ACT nasal spray, USE 2 SPRAYS IN EACH NOSTRIL DAILY AS NEEDED (Patient taking differently: Place 2 sprays into both nostrils daily as needed for allergies or rhinitis. USE 2 SPRAYS IN EACH NOSTRIL DAILY AS NEEDED), Disp: 16 g, Rfl: 1   furosemide (LASIX) 20 MG tablet, Take 1 tablet (20 mg total) by mouth daily with breakfast. As needed for edema, Disp: 30 tablet, Rfl: 3   linaclotide (LINZESS) 145 MCG CAPS capsule, Take 1 capsule (145 mcg total) by mouth daily before breakfast., Disp: 90 capsule, Rfl:  1   metoprolol succinate (TOPROL-XL) 25 MG 24 hr tablet, TAKE 1 TABLET BY MOUTH EVERY DAY (Patient taking differently: Take 25 mg by mouth every morning.), Disp: 90 tablet, Rfl: 1   Multiple Vitamin (MULTIVITAMIN) tablet, Take 1 tablet by mouth daily., Disp: , Rfl:    predniSONE (DELTASONE) 10 MG tablet, 6 tablets daily for 3 days, then reduce by 1 tablet daily until gone, Disp: 33 tablet, Rfl: 0   sodium chloride 1 g tablet, Take 1 tablet (1 g total) by mouth 2 (two) times daily with a meal., Disp: 60 tablet, Rfl: 2   Syringe/Needle, Disp, (SYRINGE 3CC/25GX1") 25G X 1" 3 ML MISC, Use for  b12 injections, Disp: 50 each, Rfl: 0   zolpidem (AMBIEN) 10 MG tablet, TAKE 1 TABLET BY MOUTH AT BEDTIME AS NEEDED FOR SLEEP, Disp: 30 tablet, Rfl: 2   celecoxib (CELEBREX) 100 MG capsule, Take 1 capsule (100 mg total) by mouth 2 (two) times daily. (Patient not taking: Reported on 10/29/2020), Disp: 60 capsule, Rfl: 2   diazepam (VALIUM) 5 MG tablet, One tab by mouth, 2 hours before procedure. (Patient not taking: Reported on 10/29/2020), Disp: 2 tablet, Rfl: 0   HYDROcodone-acetaminophen (NORCO) 10-325 MG tablet, Take 1 tablet by mouth every 6 (six) hours as needed., Disp: 120 tablet, Rfl: 0   HYDROcodone-acetaminophen (NORCO) 10-325 MG tablet, Take 1 tablet by mouth every 6 (six) hours as needed., Disp: 30 tablet, Rfl: 0   Potassium Chloride (KLOR-CON 10 PO), Take by mouth See admin instructions. Take 1 tablet in the morning and 1 tablet at night (Patient not taking: Reported on 10/29/2020), Disp: , Rfl:    rifaximin (XIFAXAN) 550 MG TABS tablet, Take 1 tablet (550 mg total) by mouth 3 (three) times daily. (Patient not taking: Reported on 10/29/2020), Disp: 42 tablet, Rfl: 0  EXAM:  VITALS per patient if applicable:  GENERAL: alert, oriented, appears well and in no acute distress  HEENT: atraumatic, conjunttiva clear, no obvious abnormalities on inspection of external nose and ears  NECK: normal movements of the  head and neck  LUNGS: on inspection no signs of respiratory distress, breathing rate appears normal, no obvious gross SOB, gasping or wheezing  CV: no obvious cyanosis  MS: moves all visible extremities without noticeable abnormality  PSYCH/NEURO: pleasant and cooperative, no obvious depression or anxiety, speech and thought processing grossly intact  ASSESSMENT AND PLAN:  Discussed the following assessment and plan:  Hyponatremia  Spinal stenosis of lumbar region without neurogenic claudication  Spinal stenosis of lumbar region Managed with a standing desk for work mode and opioids for non operable pain.  She has not had any ER visits for pain management and has not requested any early refills.  Her Refill history was confirmed via [place] Controlled Substance database by me today during her visit and there have been no prescriptions of controlled substances filled from any providers other than me. 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 Urine sodium was elevated at admission  Urine osmo was norma, , random cortisol ws normal.  Continue sodium supplementation    I discussed the assessment and treatment plan with the patient. The patient was provided an opportunity to ask questions and all were answered. The patient agreed with the plan and demonstrated an understanding of the instructions.   The patient was advised to call back or seek an in-person evaluation if the symptoms worsen or if the condition fails to improve as anticipated.   I spent 30 minutes dedicated to the care of this patient on the date of this encounter to include pre-visit review of his medical history,  Face-to-face time with the patient , and post visit ordering of testing and therapeutics.    [name], MD

## 2020-10-31 MED ORDER — HYDROCODONE-ACETAMINOPHEN 10-325 MG PO TABS: 1.0000 | ORAL_TABLET | Freq: Four times a day (QID) | ORAL | 0 refills | Status: DC | PRN

## 2020-10-31 NOTE — Assessment & Plan Note (Addendum)
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 Urine sodium was elevated at admission  Urine osmo was norma, , random cortisol ws normal.  Continue sodium supplementation

## 2020-10-31 NOTE — Assessment & Plan Note (Signed)
Managed with a standing desk for work mode and opioids for non operable pain.  She has not had any ER visits for pain management and has not requested any early refills.  Her Refill history was confirmed via [place] Controlled Substance database by me today during her visit and there have been no prescriptions of controlled substances filled from any providers other than me. Refilling for 3 months  

## 2020-11-03 ENCOUNTER — Other Ambulatory Visit: Payer: Self-pay | Admitting: Internal Medicine

## 2020-11-05 NOTE — Telephone Encounter (Signed)
Sent pt a message about everything but the prednisone.

## 2020-11-06 ENCOUNTER — Other Ambulatory Visit: Payer: Self-pay | Admitting: Internal Medicine

## 2020-11-06 MED ORDER — PREDNISONE 10 MG PO TABS: ORAL_TABLET | ORAL | 0 refills | Status: DC

## 2020-11-09 ENCOUNTER — Telehealth: Payer: Self-pay | Admitting: Family Medicine

## 2020-11-09 NOTE — Telephone Encounter (Signed)
Pt had 2 MRI's at [facility]--have we recd the results? Pt is hopeful, based on the MRI results, that we can schedule epidurals for her before her next visit.  She had an appt 9/28 but had to reschedule to Nov 1 based on her work schedule and other appts.   Also,she has the actual CD of the MRI's--do we need?

## 2020-11-10 ENCOUNTER — Other Ambulatory Visit: Payer: Self-pay

## 2020-11-10 DIAGNOSIS — M5416 Radiculopathy, lumbar region: Secondary | ICD-10-CM

## 2020-11-10 DIAGNOSIS — M5412 Radiculopathy, cervical region: Secondary | ICD-10-CM

## 2020-11-10 NOTE — Telephone Encounter (Signed)
Neck MRI worse at C4/5 with moderate spinal stenosis  Would recommend an epidural at C7-T1 that likely will help.   Low back have arthritis at multiple levels and seems worse at L4/5 that swould cause some nerve impingement.  Would recommend L4/5 epidural and see how you respond.   Please order if patient would want and Nov 1st is fine for follow up

## 2020-11-10 NOTE — Telephone Encounter (Signed)
Patient called back. Given Dr Thompson Caul message. She would like to go ahead and proceed with both Epidurals.

## 2020-11-10 NOTE — Telephone Encounter (Signed)
Left message for patient to call back for results and plan. Will order epidurals if she is fine with both injections.

## 2020-11-10 NOTE — Telephone Encounter (Signed)
Epidurals ordered.

## 2020-11-16 ENCOUNTER — Ambulatory Visit
Admission: RE | Admit: 2020-11-16 | Discharge: 2020-11-16 | Disposition: A | Discharge status: Home / Self Care | Payer: Managed Care, Other (non HMO) | Source: Ambulatory Visit | Attending: Family Medicine | Admitting: Family Medicine

## 2020-11-16 ENCOUNTER — Other Ambulatory Visit: Payer: Self-pay

## 2020-11-16 ENCOUNTER — Encounter: Payer: Self-pay | Admitting: Ophthalmology

## 2020-11-16 DIAGNOSIS — M5416 Radiculopathy, lumbar region: Secondary | ICD-10-CM

## 2020-11-16 MED ORDER — IOPAMIDOL (ISOVUE-M 200) INJECTION 41%
1.0000 mL | Freq: Once | INTRAMUSCULAR | Status: AC
  Administered 2020-11-16: 1 mL via EPIDURAL

## 2020-11-16 MED ORDER — METHYLPREDNISOLONE ACETATE 40 MG/ML INJ SUSP (RADIOLOG
80.0000 mg | Freq: Once | INTRAMUSCULAR | Status: AC
  Administered 2020-11-16: 80 mg via EPIDURAL

## 2020-11-16 NOTE — Discharge Instructions (Signed)

## 2020-11-17 ENCOUNTER — Ambulatory Visit: Payer: Managed Care, Other (non HMO) | Admitting: Family Medicine

## 2020-11-22 NOTE — Discharge Instructions (Signed)

## 2020-11-23 ENCOUNTER — Other Ambulatory Visit: Payer: Self-pay | Admitting: Internal Medicine

## 2020-11-24 ENCOUNTER — Other Ambulatory Visit: Payer: Self-pay

## 2020-11-24 ENCOUNTER — Ambulatory Visit: Payer: Managed Care, Other (non HMO) | Admitting: Anesthesiology

## 2020-11-24 ENCOUNTER — Ambulatory Visit
Admission: RE | Admit: 2020-11-24 | Discharge: 2020-11-24 | Disposition: A | Discharge status: Home / Self Care | Payer: Managed Care, Other (non HMO) | Source: Ambulatory Visit | Attending: Ophthalmology | Admitting: Ophthalmology

## 2020-11-24 ENCOUNTER — Encounter: Payer: Self-pay | Admitting: Ophthalmology

## 2020-11-24 ENCOUNTER — Encounter
Admission: RE | Disposition: A | Discharge status: Home / Self Care | Payer: Self-pay | Source: Ambulatory Visit | Attending: Ophthalmology

## 2020-11-24 DIAGNOSIS — H2512 Age-related nuclear cataract, left eye: Secondary | ICD-10-CM | POA: Diagnosis present

## 2020-11-24 DIAGNOSIS — Z79899 Other long term (current) drug therapy: Secondary | ICD-10-CM | POA: Diagnosis not present

## 2020-11-24 DIAGNOSIS — I10 Essential (primary) hypertension: Secondary | ICD-10-CM | POA: Insufficient documentation

## 2020-11-24 DIAGNOSIS — Z9109 Other allergy status, other than to drugs and biological substances: Secondary | ICD-10-CM | POA: Diagnosis not present

## 2020-11-24 DIAGNOSIS — Z91048 Other nonmedicinal substance allergy status: Secondary | ICD-10-CM | POA: Diagnosis not present

## 2020-11-24 DIAGNOSIS — Z888 Allergy status to other drugs, medicaments and biological substances status: Secondary | ICD-10-CM | POA: Diagnosis not present

## 2020-11-24 DIAGNOSIS — Z85828 Personal history of other malignant neoplasm of skin: Secondary | ICD-10-CM | POA: Insufficient documentation

## 2020-11-24 HISTORY — PX: CATARACT EXTRACTION W/PHACO: SHX586

## 2020-11-24 HISTORY — DX: Irritable bowel syndrome, unspecified: K58.9

## 2020-11-24 SURGERY — PHACOEMULSIFICATION, CATARACT, WITH IOL INSERTION
Anesthesia: Monitor Anesthesia Care | Site: Eye | Laterality: Left

## 2020-11-24 MED ORDER — ARMC OPHTHALMIC DILATING DROPS
1.0000 "application " | OPHTHALMIC | Status: DC | PRN
  Administered 2020-11-24 (×3): 1 via OPHTHALMIC

## 2020-11-24 MED ORDER — LACTATED RINGERS IV SOLN: INTRAVENOUS | Status: DC

## 2020-11-24 MED ORDER — MIDAZOLAM HCL 2 MG/2ML IJ SOLN
INTRAMUSCULAR | Status: DC | PRN
  Administered 2020-11-24: 2 mg via INTRAVENOUS

## 2020-11-24 MED ORDER — CEFUROXIME OPHTHALMIC INJECTION 1 MG/0.1 ML
INJECTION | OPHTHALMIC | Status: DC | PRN
  Administered 2020-11-24: 0.1 mL via INTRACAMERAL

## 2020-11-24 MED ORDER — TETRACAINE HCL 0.5 % OP SOLN
1.0000 [drp] | OPHTHALMIC | Status: DC | PRN
  Administered 2020-11-24 (×3): 1 [drp] via OPHTHALMIC

## 2020-11-24 MED ORDER — FENTANYL CITRATE (PF) 100 MCG/2ML IJ SOLN
INTRAMUSCULAR | Status: DC | PRN
  Administered 2020-11-24: 100 ug via INTRAVENOUS

## 2020-11-24 MED ORDER — SIGHTPATH DOSE#1 BSS IO SOLN
INTRAOCULAR | Status: DC | PRN
  Administered 2020-11-24: 59 mL via OPHTHALMIC

## 2020-11-24 MED ORDER — SIGHTPATH DOSE#1 BSS IO SOLN
INTRAOCULAR | Status: DC | PRN
  Administered 2020-11-24: 1 mL via INTRAMUSCULAR

## 2020-11-24 MED ORDER — SIGHTPATH DOSE#1 BSS IO SOLN
INTRAOCULAR | Status: DC | PRN
  Administered 2020-11-24: 15 mL

## 2020-11-24 MED ORDER — SIGHTPATH DOSE#1 NA HYALUR & NA CHOND-NA HYALUR IO KIT
PACK | INTRAOCULAR | Status: DC | PRN
  Administered 2020-11-24: 1 via OPHTHALMIC

## 2020-11-24 MED ORDER — BRIMONIDINE TARTRATE-TIMOLOL 0.2-0.5 % OP SOLN
OPHTHALMIC | Status: DC | PRN
  Administered 2020-11-24: 1 [drp] via OPHTHALMIC

## 2020-11-24 SURGICAL SUPPLY — 26 items
CANNULA ANT/CHMB 27G (MISCELLANEOUS) IMPLANT
CANNULA ANT/CHMB 27GA (MISCELLANEOUS) IMPLANT
GLOVE SRG 8 PF TXTR STRL LF DI (GLOVE) ×1 IMPLANT
GLOVE SURG ENC TEXT LTX SZ7.5 (GLOVE) ×2 IMPLANT
GLOVE SURG GAMMEX PI TX LF 7.5 (GLOVE) IMPLANT
GLOVE SURG UNDER POLY LF SZ8 (GLOVE) ×2
GOWN STRL REUS W/ TWL LRG LVL3 (GOWN DISPOSABLE) ×2 IMPLANT
GOWN STRL REUS W/TWL LRG LVL3 (GOWN DISPOSABLE) ×4
LENS IOL TECNIS EYHANCE 22.0 (Intraocular Lens) ×1 IMPLANT
MARKER SKIN DUAL TIP RULER LAB (MISCELLANEOUS) ×2 IMPLANT
NDL CAPSULORHEX 25GA (NEEDLE) IMPLANT
NDL FILTER BLUNT 18X1 1/2 (NEEDLE) ×2 IMPLANT
NDL RETROBULBAR .5 NSTRL (NEEDLE) IMPLANT
NEEDLE CAPSULORHEX 25GA (NEEDLE) IMPLANT
NEEDLE FILTER BLUNT 18X 1/2SAF (NEEDLE) ×2
NEEDLE FILTER BLUNT 18X1 1/2 (NEEDLE) ×2 IMPLANT
PACK EYE AFTER SURG (MISCELLANEOUS) IMPLANT
RING MALYGIN 7.0 (MISCELLANEOUS) IMPLANT
SUT ETHILON 10-0 CS-B-6CS-B-6 (SUTURE)
SUT VICRYL  9 0 (SUTURE)
SUT VICRYL 9 0 (SUTURE) IMPLANT
SUTURE EHLN 10-0 CS-B-6CS-B-6 (SUTURE) IMPLANT
SYR 3ML LL SCALE MARK (SYRINGE) ×4 IMPLANT
SYR TB 1ML LUER SLIP (SYRINGE) ×2 IMPLANT
WATER STERILE IRR 250ML POUR (IV SOLUTION) ×2 IMPLANT
WIPE NON LINTING 3.25X3.25 (MISCELLANEOUS) ×2 IMPLANT

## 2020-11-24 NOTE — Anesthesia Postprocedure Evaluation (Signed)
Anesthesia Post Note  Patient: Adrienne Robinson  Procedure(s) Performed: CATARACT EXTRACTION PHACO AND INTRAOCULAR LENS PLACEMENT (IOC) LEFT 6.09 00:46.7 (Left: Eye)     Patient location during evaluation: PACU Anesthesia Type: MAC Level of consciousness: awake and alert Pain management: pain level controlled Vital Signs Assessment: post-procedure vital signs reviewed and stable Respiratory status: spontaneous breathing, nonlabored ventilation, respiratory function stable and patient connected to nasal cannula oxygen Cardiovascular status: stable and blood pressure returned to baseline Postop Assessment: no apparent nausea or vomiting Anesthetic complications: no   No notable events documented.  Fidel Levy

## 2020-11-24 NOTE — Transfer of Care (Signed)
Immediate Anesthesia Transfer of Care Note  Patient: Adrienne Robinson  Procedure(s) Performed: CATARACT EXTRACTION PHACO AND INTRAOCULAR LENS PLACEMENT (IOC) LEFT 6.09 00:46.7 (Left: Eye)  Patient Location: PACU  Anesthesia Type: MAC  Level of Consciousness: awake, alert  and patient cooperative  Airway and Oxygen Therapy: Patient Spontanous Breathing and Patient connected to supplemental oxygen  Post-op Assessment: Post-op Vital signs reviewed, Patient's Cardiovascular Status Stable, Respiratory Function Stable, Patent Airway and No signs of Nausea or vomiting  Post-op Vital Signs: Reviewed and stable  Complications: No notable events documented.

## 2020-11-24 NOTE — H&P (Signed)
[facility]   Primary Care Physician:  [name], MD Ophthalmologist: Dr. [name]  Pre-Procedure History & Physical: HPI:  Adrienne Robinson is a 65 y.o. female here for ophthalmic surgery.   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   Prepatellar effusion of right knee 06/09/2019   Aspiration done June 13, 2019   Scoliosis    Spinal stenosis of lumbar region    Squamous cell carcinoma of skin 06/24/2018   R lateral calf   Tubular adenoma of colon     Past Surgical History:  Procedure Laterality Date   ABDOMINAL HYSTERECTOMY     APPENDECTOMY     CARDIAC CATHETERIZATION     normal coronaries, no wall motion abnormalities 11/02/09 [facility])   NASAL SINUS SURGERY     OOPHORECTOMY     RADIOLOGY WITH ANESTHESIA  02/26/2012   Procedure: RADIOLOGY WITH ANESTHESIA;  Surgeon: [name], MD;  Location: [place];  Service: Radiology;  Laterality: N/A;   REVERSE SHOULDER ARTHROPLASTY Right 06/27/2013   Procedure: REVERSE RIGHT TOTAL SHOULDER ARTHROPLASTY;  Surgeon: [name], MD;  Location: [place];  Service: Orthopedics;  Laterality: Right;   SHOULDER ARTHROSCOPY WITH ROTATOR CUFF REPAIR AND SUBACROMIAL DECOMPRESSION  2007   left   TONSILLECTOMY  adnoids    Prior to Admission medications   Medication Sig Start Date End Date Taking? Authorizing Provider  amLODipine (NORVASC) 10 MG tablet Take 10 mg by mouth daily.   Yes [provider]  buPROPion (WELLBUTRIN XL) 150 MG 24 hr tablet Take 1 tablet (150 mg total) by mouth every morning. 07/27/20  Yes [name], MD   Cholecalciferol (VITAMIN D3) 50 MCG (2000 UT) CAPS Take 1 capsule by mouth daily.   Yes [provider]  cyanocobalamin (,VITAMIN B-12,) 1000 MCG/ML injection INJECT 1 ML (1,000 MCG TOTAL) INTO THE MUSCLE ONCE A WEEK. 11/23/20  Yes [name], MD  dicyclomine (BENTYL) 20 MG tablet TAKE 1 TABLET (20 MG TOTAL) BY MOUTH 3 (THREE) TIMES DAILY BEFORE MEALS. 11/03/20  Yes Pyrtle, Lajuan Lines, MD  esomeprazole (NEXIUM) 20 MG capsule Take 1 capsule (20 mg total) by mouth daily. 09/30/20  Yes Pyrtle, Lajuan Lines, MD  furosemide (LASIX) 20 MG tablet Take 1 tablet (20 mg total) by mouth daily with breakfast. As needed for edema 09/26/20  Yes [name], MD  HYDROcodone-acetaminophen (NORCO) 10-325 MG tablet Take 1 tablet by mouth every 6 (six) hours as needed. 10/29/20  Yes [name], MD  HYDROcodone-acetaminophen (NORCO) 10-325 MG tablet Take 1 tablet by mouth every 6 (six) hours as needed. 10/31/20  Yes [name], MD  linaclotide Rolan Lipa) 145 MCG CAPS capsule Take 1 capsule (145 mcg total) by mouth daily before breakfast. 09/28/20  Yes Pyrtle, Lajuan Lines, MD  metoprolol succinate (TOPROL-XL) 25 MG 24 hr tablet TAKE 1 TABLET BY MOUTH EVERY DAY Patient taking differently: Take 25 mg by mouth every morning. 06/08/20  Yes [name], MD  Multiple Vitamin (MULTIVITAMIN) tablet Take 1  tablet by mouth daily.   Yes [provider]  predniSONE (DELTASONE) 10 MG tablet 6 tablets daily for 3 days, then reduce by 1 tablet daily until gone 11/06/20  Yes [name], MD  sodium chloride 1 g tablet Take 1 tablet (1 g total) by mouth 2 (two) times daily with a meal. 09/15/20  Yes [name], MD  zolpidem (AMBIEN) 10 MG tablet TAKE 1 TABLET BY MOUTH AT BEDTIME AS NEEDED FOR SLEEP 10/12/20  Yes [name], MD  diazepam (VALIUM) 5 MG tablet One tab by mouth, 2 hours before procedure. Patient not taking: No sig reported 10/19/20   Lyndal Pulley, DO  fluticasone [facility]) 50 MCG/ACT nasal spray  USE 2 SPRAYS IN EACH NOSTRIL DAILY AS NEEDED Patient taking differently: Place 2 sprays into both nostrils daily as needed for allergies or rhinitis. USE 2 SPRAYS IN EACH NOSTRIL DAILY AS NEEDED 05/29/18   [name], MD  Potassium Chloride (KLOR-CON 10 PO) Take by mouth See admin instructions. Take 1 tablet in the morning and 1 tablet at night Patient not taking: No sig reported    [provider]  rifaximin (XIFAXAN) 550 MG TABS tablet Take 1 tablet (550 mg total) by mouth 3 (three) times daily. Patient not taking: Reported on 10/29/2020 09/28/20   Jerene Bears, MD  Syringe/Needle, Disp, (SYRINGE 3CC/25GX1") 25G X 1" 3 ML MISC Use for b12 injections 08/21/18   [name], MD    Allergies as of 11/02/2020 - Review Complete 10/29/2020  Allergen Reaction Noted   Buspirone Other (See Comments) 08/10/2020   Celebrex [celecoxib] Swelling 10/29/2020   Adhesive [tape] Rash and Other (See Comments) 11/03/2010   Morphine and related Rash 04/16/2012    Family History  Problem Relation Age of Onset   Colon cancer Father 87   Diabetes Father    Heart disease Father    Rectal cancer Father    Thyroid disease Mother    Colon cancer Paternal Grandfather    Colon cancer Other        pggm   Breast cancer Paternal Grandmother    Stomach cancer Neg Hx     Social History   Socioeconomic History   Marital status: Married    Spouse name: Not on file   Number of children: 0   Years of education: 18   Highest education level: Not on file  Occupational History   Occupation: Nurse executive   Tobacco Use   Smoking status: Never   Smokeless tobacco: Never  Vaping Use   Vaping Use: Never used  Substance and Sexual Activity   Alcohol use: Not Currently    Alcohol/week: 2.0 standard drinks    Types: 2 Glasses of wine per week    Comment: 2-3 week with dinner   Drug use: No   Sexual activity: Not on file  Other Topics Concern   Not on file  Social History Narrative   Lives at  home with her husband.   Right-handed.   2-3 diet cokes per day.   Social Determinants of Health   Financial Resource Strain: Not on file  Food Insecurity: Not on file  Transportation Needs: Not on file  Physical Activity: Not on file  Stress: Not on file  Social Connections: Not on file  Intimate Partner Violence: Not on file    Review of Systems: See HPI, otherwise negative ROS  Physical Exam: BP 140/75   Pulse 62   Temp 98.1  F (36.7 C) (Temporal)   Ht 5\' 6"  (1.676 m)   Wt 55.3 kg   SpO2 98%   BMI 19.69 kg/m  General:   Alert,  pleasant and cooperative in NAD Head:  Normocephalic and atraumatic. Lungs:  Clear to auscultation.    Heart:  Regular rate and rhythm.   Impression/Plan: Adrienne Robinson is here for ophthalmic surgery.  Risks, benefits, limitations, and alternatives regarding ophthalmic surgery have been reviewed with the patient.  Questions have been answered.  All parties agreeable.   [name], MD  11/24/2020, 11:17 AM

## 2020-11-24 NOTE — Op Note (Signed)
OPERATIVE NOTE  Adrienne Robinson [id number] 11/24/2020   PREOPERATIVE DIAGNOSIS:  Nuclear sclerotic cataract left eye. H25.12   POSTOPERATIVE DIAGNOSIS:    Nuclear sclerotic cataract left eye.     PROCEDURE:  Phacoemusification with posterior chamber intraocular lens placement of the left eye  Ultrasound time: Procedure(s) with comments: CATARACT EXTRACTION PHACO AND INTRAOCULAR LENS PLACEMENT (IOC) LEFT 6.09 00:46.7 (Left) - patient wants early  LENS:   Implant Name Type Inv. Item Serial No. Manufacturer Lot No. LRB No. Used Action  LENS IOL TECNIS EYHANCE 22.0 - [id number] Intraocular Lens LENS IOL TECNIS EYHANCE 22.0 [id number] [employer]   Left 1 Implanted      SURGEON:  Wyonia Hough, MD   ANESTHESIA:  Topical with tetracaine drops and 2% Xylocaine jelly, augmented with 1% preservative-free intracameral lidocaine.    COMPLICATIONS:  None.   DESCRIPTION OF PROCEDURE:  The patient was identified in the holding room and transported to the operating room and placed in the supine position under the operating microscope.  The left eye was identified as the operative eye and it was prepped and draped in the usual sterile ophthalmic fashion.   A 1 millimeter clear-corneal paracentesis was made at the 1:30 position.  0.5 ml of preservative-free 1% lidocaine was injected into the anterior chamber.  The anterior chamber was filled with Viscoat viscoelastic.  A 2.4 millimeter keratome was used to make a near-clear corneal incision at the 10:30 position.  .  A curvilinear capsulorrhexis was made with a cystotome and capsulorrhexis forceps.  Balanced salt solution was used to hydrodissect and hydrodelineate the nucleus.   Phacoemulsification was then used in stop and chop fashion to remove the lens nucleus and epinucleus.  The remaining cortex was then removed using the irrigation and aspiration handpiece. Provisc was then placed into the capsular bag to distend it for lens placement.  A  lens was then injected into the capsular bag.  The remaining viscoelastic was aspirated.   Wounds were hydrated with balanced salt solution.  The anterior chamber was inflated to a physiologic pressure with balanced salt solution.  No wound leaks were noted. Cefuroxime 0.1 ml of a 10mg /ml solution was injected into the anterior chamber for a dose of 1 mg of intracameral antibiotic at the completion of the case.   Timolol and Brimonidine drops were applied to the eye.  The patient was taken to the recovery room in stable condition without complications of anesthesia or surgery.  [name] 11/24/2020, 12:07 PM

## 2020-11-24 NOTE — Anesthesia Preprocedure Evaluation (Signed)
Anesthesia Evaluation  Patient identified by MRN, date of birth, ID band Patient awake    Reviewed: NPO status   History of Anesthesia Complications Negative for: history of anesthetic complications  Airway Mallampati: II  TM Distance: >3 FB Neck ROM: full    Dental no notable dental hx.    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; Na+ (10/29/2020) = 137;  Renal/GU negative Renal ROS  negative genitourinary   Musculoskeletal  (+) Arthritis ,   Abdominal   Peds  Hematology negative hematology ROS (+)   Anesthesia Other Findings pcp: 10/2020: tullo;    Reproductive/Obstetrics                             Anesthesia Physical Anesthesia Plan  ASA: 2  Anesthesia Plan: MAC   Post-op Pain Management:    Induction:   PONV Risk Score and Plan: 2 and Midazolam and TIVA  Airway Management Planned:   Additional Equipment:   Intra-op Plan:   Post-operative Plan:   Informed Consent: I have reviewed the patients History and Physical, chart, labs and discussed the procedure including the risks, benefits and alternatives for the proposed anesthesia with the patient or authorized representative who has indicated his/her understanding and acceptance.       Plan Discussed with: CRNA  Anesthesia Plan Comments:         Anesthesia Quick Evaluation

## 2020-11-25 ENCOUNTER — Encounter: Payer: Self-pay | Admitting: Ophthalmology

## 2020-11-29 ENCOUNTER — Other Ambulatory Visit: Payer: Self-pay | Admitting: Internal Medicine

## 2020-12-01 MED ORDER — HYDROCODONE-ACETAMINOPHEN 10-325 MG PO TABS: 1.0000 | ORAL_TABLET | Freq: Four times a day (QID) | ORAL | 0 refills | Status: DC | PRN

## 2020-12-02 ENCOUNTER — Other Ambulatory Visit: Payer: Managed Care, Other (non HMO)

## 2020-12-06 NOTE — Discharge Instructions (Signed)

## 2020-12-08 ENCOUNTER — Ambulatory Visit: Payer: Managed Care, Other (non HMO) | Admitting: Anesthesiology

## 2020-12-08 ENCOUNTER — Other Ambulatory Visit: Payer: Self-pay

## 2020-12-08 ENCOUNTER — Ambulatory Visit
Admission: RE | Admit: 2020-12-08 | Discharge: 2020-12-08 | Disposition: A | Discharge status: Home / Self Care | Payer: Managed Care, Other (non HMO) | Source: Ambulatory Visit | Attending: Ophthalmology | Admitting: Ophthalmology

## 2020-12-08 ENCOUNTER — Encounter
Admission: RE | Disposition: A | Discharge status: Home / Self Care | Payer: Self-pay | Source: Ambulatory Visit | Attending: Ophthalmology

## 2020-12-08 ENCOUNTER — Encounter: Payer: Self-pay | Admitting: Ophthalmology

## 2020-12-08 DIAGNOSIS — Z9842 Cataract extraction status, left eye: Secondary | ICD-10-CM | POA: Insufficient documentation

## 2020-12-08 DIAGNOSIS — Z8616 Personal history of COVID-19: Secondary | ICD-10-CM | POA: Insufficient documentation

## 2020-12-08 DIAGNOSIS — Z961 Presence of intraocular lens: Secondary | ICD-10-CM | POA: Diagnosis not present

## 2020-12-08 DIAGNOSIS — H2511 Age-related nuclear cataract, right eye: Secondary | ICD-10-CM | POA: Diagnosis present

## 2020-12-08 DIAGNOSIS — Z85828 Personal history of other malignant neoplasm of skin: Secondary | ICD-10-CM | POA: Insufficient documentation

## 2020-12-08 DIAGNOSIS — Z888 Allergy status to other drugs, medicaments and biological substances status: Secondary | ICD-10-CM | POA: Diagnosis not present

## 2020-12-08 DIAGNOSIS — Z79899 Other long term (current) drug therapy: Secondary | ICD-10-CM | POA: Diagnosis not present

## 2020-12-08 HISTORY — PX: CATARACT EXTRACTION W/PHACO: SHX586

## 2020-12-08 SURGERY — PHACOEMULSIFICATION, CATARACT, WITH IOL INSERTION
Anesthesia: Monitor Anesthesia Care | Site: Eye | Laterality: Right

## 2020-12-08 MED ORDER — SIGHTPATH DOSE#1 NA HYALUR & NA CHOND-NA HYALUR IO KIT
PACK | INTRAOCULAR | Status: DC | PRN
  Administered 2020-12-08: 1 via OPHTHALMIC

## 2020-12-08 MED ORDER — BRIMONIDINE TARTRATE-TIMOLOL 0.2-0.5 % OP SOLN
OPHTHALMIC | Status: DC | PRN
  Administered 2020-12-08: 1 [drp] via OPHTHALMIC

## 2020-12-08 MED ORDER — LACTATED RINGERS IV SOLN: INTRAVENOUS | Status: DC

## 2020-12-08 MED ORDER — SIGHTPATH DOSE#1 BSS IO SOLN
INTRAOCULAR | Status: DC | PRN
  Administered 2020-12-08: 15 mL via INTRAOCULAR

## 2020-12-08 MED ORDER — MIDAZOLAM HCL 2 MG/2ML IJ SOLN
INTRAMUSCULAR | Status: DC | PRN
  Administered 2020-12-08: 2 mg via INTRAVENOUS

## 2020-12-08 MED ORDER — FENTANYL CITRATE (PF) 100 MCG/2ML IJ SOLN
INTRAMUSCULAR | Status: DC | PRN
  Administered 2020-12-08 (×2): 50 ug via INTRAVENOUS

## 2020-12-08 MED ORDER — CEFUROXIME OPHTHALMIC INJECTION 1 MG/0.1 ML
INJECTION | OPHTHALMIC | Status: DC | PRN
  Administered 2020-12-08: 0.1 mL via INTRACAMERAL

## 2020-12-08 MED ORDER — ARMC OPHTHALMIC DILATING DROPS
1.0000 "application " | OPHTHALMIC | Status: DC | PRN
  Administered 2020-12-08 (×3): 1 via OPHTHALMIC

## 2020-12-08 MED ORDER — TETRACAINE HCL 0.5 % OP SOLN
1.0000 [drp] | OPHTHALMIC | Status: DC | PRN
  Administered 2020-12-08 (×3): 1 [drp] via OPHTHALMIC

## 2020-12-08 MED ORDER — SIGHTPATH DOSE#1 BSS IO SOLN
INTRAOCULAR | Status: DC | PRN
  Administered 2020-12-08: 46 mL via OPHTHALMIC

## 2020-12-08 MED ORDER — SIGHTPATH DOSE#1 BSS IO SOLN
INTRAOCULAR | Status: DC | PRN
  Administered 2020-12-08: 2 mL

## 2020-12-08 SURGICAL SUPPLY — 19 items
CANNULA ANT/CHMB 27G (MISCELLANEOUS) IMPLANT
CANNULA ANT/CHMB 27GA (MISCELLANEOUS) IMPLANT
GLOVE SRG 8 PF TXTR STRL LF DI (GLOVE) ×1 IMPLANT
GLOVE SURG ENC TEXT LTX SZ7.5 (GLOVE) ×1 IMPLANT
GLOVE SURG UNDER POLY LF SZ8 (GLOVE) ×2
GOWN STRL REUS W/ TWL LRG LVL3 (GOWN DISPOSABLE) ×2 IMPLANT
GOWN STRL REUS W/TWL LRG LVL3 (GOWN DISPOSABLE) ×4
LENS IOL TECNIS EYHANCE 23.0 (Intraocular Lens) ×1 IMPLANT
MARKER SKIN DUAL TIP RULER LAB (MISCELLANEOUS) ×2 IMPLANT
NDL CAPSULORHEX 25GA (NEEDLE) IMPLANT
NDL FILTER BLUNT 18X1 1/2 (NEEDLE) ×2 IMPLANT
NEEDLE CAPSULORHEX 25GA (NEEDLE) IMPLANT
NEEDLE FILTER BLUNT 18X 1/2SAF (NEEDLE) ×2
NEEDLE FILTER BLUNT 18X1 1/2 (NEEDLE) ×2 IMPLANT
PACK EYE AFTER SURG (MISCELLANEOUS) IMPLANT
SYR 3ML LL SCALE MARK (SYRINGE) ×4 IMPLANT
SYR TB 1ML LUER SLIP (SYRINGE) ×2 IMPLANT
WATER STERILE IRR 250ML POUR (IV SOLUTION) ×2 IMPLANT
WIPE NON LINTING 3.25X3.25 (MISCELLANEOUS) ×2 IMPLANT

## 2020-12-08 NOTE — Transfer of Care (Signed)
Immediate Anesthesia Transfer of Care Note  Patient: Adrienne Robinson  Procedure(s) Performed: CATARACT EXTRACTION PHACO AND INTRAOCULAR LENS PLACEMENT (IOC) RIGHT (Right)  Patient Location: PACU  Anesthesia Type: MAC  Level of Consciousness: awake, alert  and patient cooperative  Airway and Oxygen Therapy: Patient Spontanous Breathing and Patient connected to supplemental oxygen  Post-op Assessment: Post-op Vital signs reviewed, Patient's Cardiovascular Status Stable, Respiratory Function Stable, Patent Airway and No signs of Nausea or vomiting  Post-op Vital Signs: Reviewed and stable  Complications: No notable events documented.

## 2020-12-08 NOTE — Anesthesia Postprocedure Evaluation (Signed)
Anesthesia Post Note  Patient: Adrienne Robinson  Procedure(s) Performed: CATARACT EXTRACTION PHACO AND INTRAOCULAR LENS PLACEMENT (IOC) RIGHT 3.92 00:49.0 (Right: Eye)     Patient location during evaluation: PACU Anesthesia Type: MAC Level of consciousness: awake and alert Pain management: pain level controlled Vital Signs Assessment: post-procedure vital signs reviewed and stable Respiratory status: spontaneous breathing and nonlabored ventilation Cardiovascular status: blood pressure returned to baseline Postop Assessment: no apparent nausea or vomiting Anesthetic complications: no   No notable events documented.  Bernadette Armijo [employer]

## 2020-12-08 NOTE — Op Note (Signed)
  LOCATION:  [place]   PREOPERATIVE DIAGNOSIS:    Nuclear sclerotic cataract right eye. H25.11   POSTOPERATIVE DIAGNOSIS:  Nuclear sclerotic cataract right eye.     PROCEDURE:  Phacoemusification with posterior chamber intraocular lens placement of the right eye   ULTRASOUND TIME: Procedure(s) with comments: CATARACT EXTRACTION PHACO AND INTRAOCULAR LENS PLACEMENT (IOC) RIGHT 3.92 00:49.0 (Right) - patient request early  LENS:   Implant Name Type Inv. Item Serial No. Manufacturer Lot No. LRB No. Used Action  LENS IOL TECNIS EYHANCE 23.0 - [id number] Intraocular Lens LENS IOL TECNIS EYHANCE 23.0 [id number]   Right 1 Implanted         SURGEON:  Wyonia Hough, MD   ANESTHESIA:  Topical with tetracaine drops and 2% Xylocaine jelly, augmented with 1% preservative-free intracameral lidocaine.    COMPLICATIONS:  None.   DESCRIPTION OF PROCEDURE:  The patient was identified in the holding room and transported to the operating room and placed in the supine position under the operating microscope.  The right eye was identified as the operative eye and it was prepped and draped in the usual sterile ophthalmic fashion.   A 1 millimeter clear-corneal paracentesis was made at the 12:00 position.  0.5 ml of preservative-free 1% lidocaine was injected into the anterior chamber. The anterior chamber was filled with Viscoat viscoelastic.  A 2.4 millimeter keratome was used to make a near-clear corneal incision at the 9:00 position.  A curvilinear capsulorrhexis was made with a cystotome and capsulorrhexis forceps.  Balanced salt solution was used to hydrodissect and hydrodelineate the nucleus.   Phacoemulsification was then used in stop and chop fashion to remove the lens nucleus and epinucleus.  The remaining cortex was then removed using the irrigation and aspiration handpiece. Provisc was then placed into the capsular bag to distend it for lens placement.  A lens was  then injected into the capsular bag.  The remaining viscoelastic was aspirated.   Wounds were hydrated with balanced salt solution.  The anterior chamber was inflated to a physiologic pressure with balanced salt solution.  No wound leaks were noted. Cefuroxime 0.1 ml of a 10mg /ml solution was injected into the anterior chamber for a dose of 1 mg of intracameral antibiotic at the completion of the case.   Timolol and Brimonidine drops were applied to the eye.  The patient was taken to the recovery room in stable condition without complications of anesthesia or surgery.   Adrienne Robinson 12/08/2020, 11:37 AM

## 2020-12-08 NOTE — H&P (Addendum)
[facility]   Primary Care Physician:  [name], MD Ophthalmologist: Dr. [name]  Pre-Procedure History & Physical: HPI:  Adrienne Robinson is a 65 y.o. female here for ophthalmic surgery.   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   Prepatellar effusion of right knee 06/09/2019   Aspiration done June 13, 2019   Scoliosis    Spinal stenosis of lumbar region    Squamous cell carcinoma of skin 06/24/2018   R lateral calf   Tubular adenoma of colon     Past Surgical History:  Procedure Laterality Date   ABDOMINAL HYSTERECTOMY     APPENDECTOMY     CARDIAC CATHETERIZATION     normal coronaries, no wall motion abnormalities 11/02/09 [facility])   CATARACT EXTRACTION W/PHACO Left 11/24/2020   Procedure: CATARACT EXTRACTION PHACO AND INTRAOCULAR LENS PLACEMENT ([place]) LEFT 6.09 00:46.7;  Surgeon: [name], MD;  Location: [place];  Service: Ophthalmology;  Laterality: Left;  patient wants early   NASAL SINUS SURGERY     OOPHORECTOMY     RADIOLOGY WITH ANESTHESIA  02/26/2012   Procedure: RADIOLOGY WITH ANESTHESIA;  Surgeon: [name], MD;  Location: [place];  Service: Radiology;  Laterality: N/A;   REVERSE SHOULDER ARTHROPLASTY Right 06/27/2013   Procedure: REVERSE RIGHT TOTAL SHOULDER ARTHROPLASTY;  Surgeon: [name], MD;  Location: [place];  Service: Orthopedics;  Laterality: Right;   SHOULDER ARTHROSCOPY WITH ROTATOR CUFF REPAIR AND SUBACROMIAL DECOMPRESSION  2007   left   TONSILLECTOMY  adnoids    Prior to Admission medications   Medication Sig Start Date  End Date Taking? Authorizing Provider  amLODipine (NORVASC) 10 MG tablet Take 10 mg by mouth daily.   Yes [provider]  buPROPion (WELLBUTRIN XL) 150 MG 24 hr tablet Take 1 tablet (150 mg total) by mouth every morning. 07/27/20  Yes [name], MD  Cholecalciferol (VITAMIN D3) 50 MCG (2000 UT) CAPS Take 1 capsule by mouth daily.   Yes [provider]  cyanocobalamin (,VITAMIN B-12,) 1000 MCG/ML injection INJECT 1 ML (1,000 MCG TOTAL) INTO THE MUSCLE ONCE A WEEK. 11/23/20  Yes [name], MD  diazepam (VALIUM) 5 MG tablet One tab by mouth, 2 hours before procedure. 10/19/20  Yes Hulan Saas M, DO  dicyclomine (BENTYL) 20 MG tablet TAKE 1 TABLET (20 MG TOTAL) BY MOUTH 3 (THREE) TIMES DAILY BEFORE MEALS. 11/29/20  Yes Pyrtle, Lajuan Lines, MD  esomeprazole (NEXIUM) 20 MG capsule Take 1 capsule (20 mg total) by mouth daily. 09/30/20  Yes Pyrtle, Lajuan Lines, MD  fluticasone (FLONASE) 50 MCG/ACT nasal spray USE 2 SPRAYS IN EACH NOSTRIL DAILY AS NEEDED Patient taking differently: Place 2 sprays into both nostrils daily as needed for allergies or rhinitis. USE 2 SPRAYS IN EACH NOSTRIL DAILY AS NEEDED 05/29/18  Yes [name], MD  furosemide (LASIX) 20 MG tablet Take 1 tablet (20 mg total) by mouth daily with breakfast. As needed for edema 09/26/20  Yes [name], MD  HYDROcodone-acetaminophen [facility])  10-325 MG tablet Take 1 tablet by mouth every 6 (six) hours as needed. 12/01/20  Yes [name], MD  linaclotide Rolan Lipa) 145 MCG CAPS capsule Take 1 capsule (145 mcg total) by mouth daily before breakfast. 09/28/20  Yes Pyrtle, Lajuan Lines, MD  metoprolol succinate (TOPROL-XL) 25 MG 24 hr tablet TAKE 1 TABLET BY MOUTH EVERY DAY Patient taking differently: Take 25 mg by mouth every morning. 06/08/20  Yes [name], MD  Multiple Vitamin (MULTIVITAMIN) tablet Take 1 tablet by mouth daily.   Yes [provider]  Potassium Chloride (KLOR-CON 10 PO) Take by mouth See admin  instructions. Take 1 tablet in the morning and 1 tablet at night   Yes [provider]  predniSONE (DELTASONE) 10 MG tablet 6 tablets daily for 3 days, then reduce by 1 tablet daily until gone 11/06/20  Yes [name], MD  rifaximin (XIFAXAN) 550 MG TABS tablet Take 1 tablet (550 mg total) by mouth 3 (three) times daily. 09/28/20  Yes Pyrtle, Lajuan Lines, MD  sodium chloride 1 g tablet Take 1 tablet (1 g total) by mouth 2 (two) times daily with a meal. 09/15/20  Yes [name], MD  Syringe/Needle, Disp, (SYRINGE 3CC/25GX1") 25G X 1" 3 ML MISC Use for b12 injections 08/21/18  Yes [name], MD  zolpidem (AMBIEN) 10 MG tablet TAKE 1 TABLET BY MOUTH AT BEDTIME AS NEEDED FOR SLEEP 10/12/20  Yes [name], MD    Allergies as of 11/02/2020 - Review Complete 10/29/2020  Allergen Reaction Noted   Buspirone Other (See Comments) 08/10/2020   Celebrex [celecoxib] Swelling 10/29/2020   Adhesive [tape] Rash and Other (See Comments) 11/03/2010   Morphine and related Rash 04/16/2012    Family History  Problem Relation Age of Onset   Colon cancer Father 2   Diabetes Father    Heart disease Father    Rectal cancer Father    Thyroid disease Mother    Colon cancer Paternal Grandfather    Colon cancer Other        pggm   Breast cancer Paternal Grandmother    Stomach cancer Neg Hx     Social History   Socioeconomic History   Marital status: Married    Spouse name: Not on file   Number of children: 0   Years of education: 18   Highest education level: Not on file  Occupational History   Occupation: Nurse executive   Tobacco Use   Smoking status: Never   Smokeless tobacco: Never  Vaping Use   Vaping Use: Never used  Substance and Sexual Activity   Alcohol use: Not Currently    Alcohol/week: 2.0 standard drinks    Types: 2 Glasses of wine per week    Comment: 2-3 week with dinner   Drug use: No   Sexual activity: Not on file  Other Topics Concern   Not on file  Social  History Narrative   Lives at home with her husband.   Right-handed.   2-3 diet cokes per day.   Social Determinants of Health   Financial Resource Strain: Not on file  Food Insecurity: Not on file  Transportation Needs: Not on file  Physical Activity: Not on file  Stress: Not on file  Social Connections: Not on file  Intimate Partner Violence: Not on file    Review of Systems: See HPI, otherwise negative ROS  Physical Exam: BP 120/63   Pulse 62   Temp 97.6 F (  36.4 C) (Temporal)   Resp 16   Wt 56.7 kg   SpO2 97%   BMI 20.18 kg/m  General:   Alert,  pleasant and cooperative in NAD Head:  Normocephalic and atraumatic. Lungs:  Clear to auscultation.    Heart:  Regular rate and rhythm.   Impression/Plan: Adrienne Robinson is here for ophthalmic surgery.  Risks, benefits, limitations, and alternatives regarding ophthalmic surgery have been reviewed with the patient.  Questions have been answered.  All parties agreeable.   [name], MD  12/08/2020, 10:38 AM

## 2020-12-08 NOTE — Anesthesia Procedure Notes (Signed)
Procedure Name: MAC Date/Time: 12/08/2020 11:19 AM Performed by: Jeannene Patella, CRNA Pre-anesthesia Checklist: Patient identified, Emergency Drugs available, Suction available, Timeout performed and Patient being monitored Patient Re-evaluated:Patient Re-evaluated prior to induction Oxygen Delivery Method: Nasal cannula Placement Confirmation: positive ETCO2

## 2020-12-08 NOTE — Anesthesia Preprocedure Evaluation (Signed)
Anesthesia Evaluation  Patient identified by MRN, date of birth, ID band Patient awake    Reviewed: Allergy & Precautions, NPO status , Patient's Chart, lab work & pertinent test results  History of Anesthesia Complications Negative for: history of anesthetic complications  Airway Mallampati: II  TM Distance: >3 FB Neck ROM: full    Dental no notable dental hx.    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; Na+ (10/29/2020) = 137;  Renal/GU negative Renal ROS  negative genitourinary   Musculoskeletal  (+) Arthritis ,   Abdominal Normal abdominal exam  (+)   Peds  Hematology negative hematology ROS (+)   Anesthesia Other Findings pcp: 10/2020: tullo;    Reproductive/Obstetrics                             Anesthesia Physical  Anesthesia Plan  ASA: 2  Anesthesia Plan: MAC   Post-op Pain Management:    Induction:   PONV Risk Score and Plan: 2 and Midazolam, TIVA and Treatment may vary due to age or medical condition  Airway Management Planned:   Additional Equipment: None  Intra-op Plan:   Post-operative Plan:   Informed Consent: I have reviewed the patients History and Physical, chart, labs and discussed the procedure including the risks, benefits and alternatives for the proposed anesthesia with the patient or authorized representative who has indicated his/her understanding and acceptance.     Dental advisory given  Plan Discussed with: CRNA  Anesthesia Plan Comments:          Anesthesia Quick Evaluation

## 2020-12-09 ENCOUNTER — Encounter: Payer: Self-pay | Admitting: Ophthalmology

## 2020-12-10 NOTE — Progress Notes (Deleted)
Adrienne Robinson Phone: [phone] Subjective:    I'm seeing this patient by the request  of:  [name], MD  CC:   [id number]  Adrienne Robinson is a 65 y.o. female coming in with complaint of back and neck pain. OMT 09/29/2020. Also f/u for L thumb pain. Patient states Patient states   Medications patient has been prescribed: None  Taking:         Reviewed prior external information including notes and imaging from previsou exam, outside providers and external EMR if available.   As well as notes that were available from care everywhere and other healthcare systems.  Past medical history, social, surgical and family history all reviewed in electronic medical record.  No pertanent information unless stated regarding to the chief complaint.   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   Morphine And Related Rash     Review of Systems:  No headache, visual changes, nausea, vomiting, diarrhea, constipation, dizziness, abdominal pain, skin rash, fevers, chills, night sweats, weight loss, swollen  lymph nodes, body aches, joint swelling, chest pain, shortness of breath, mood changes. POSITIVE muscle aches  Objective  There were no vitals taken for this visit.   General: No apparent distress alert and oriented x3 mood and affect normal, dressed appropriately.  HEENT: Pupils equal, extraocular movements intact  Respiratory: Patient's speak in full sentences and does not appear short of breath  Cardiovascular: No lower extremity edema, non tender, no erythema  Neuro: Cranial nerves II through XII are intact, neurovascularly intact in all extremities with 2+ DTRs and 2+ pulses.  Gait normal with good balance and coordination.  MSK:  Non tender with full range of motion and good stability and symmetric strength and tone of shoulders, elbows, wrist, hip, knee and ankles bilaterally.  Back - Normal skin, Spine with normal alignment and no deformity.  No tenderness to vertebral process palpation.  Paraspinous muscles are not tender and without spasm.   Range of motion is full at neck and lumbar sacral regions  Osteopathic findings  C2 flexed rotated and side bent right C6 flexed rotated and side bent left T3 extended rotated and side bent right inhaled rib T9 extended rotated and side bent left L2 flexed rotated and side bent right Sacrum right on right       Assessment and Plan:    Nonallopathic problems  Decision today to treat with OMT was based on Physical Exam  After verbal consent patient was treated with HVLA, ME, FPR techniques in cervical, rib, thoracic, lumbar, and sacral  areas  Patient tolerated the procedure well with improvement in symptoms  Patient given exercises, stretches and lifestyle modifications  See medications in patient instructions if given  Patient will follow up in 4-8 weeks      The above documentation has been reviewed and is accurate and complete Jacqualin Combes       Note: This dictation was prepared with Dragon dictation along with  smaller phrase technology. Any transcriptional errors that result from this process are unintentional.

## 2020-12-11 ENCOUNTER — Other Ambulatory Visit: Payer: Self-pay | Admitting: Internal Medicine

## 2020-12-15 ENCOUNTER — Telehealth: Payer: Self-pay | Admitting: Family Medicine

## 2020-12-15 ENCOUNTER — Other Ambulatory Visit (HOSPITAL_COMMUNITY): Payer: Self-pay | Admitting: Interventional Radiology

## 2020-12-15 ENCOUNTER — Telehealth (HOSPITAL_COMMUNITY): Payer: Self-pay

## 2020-12-15 DIAGNOSIS — I729 Aneurysm of unspecified site: Secondary | ICD-10-CM

## 2020-12-15 NOTE — Telephone Encounter (Signed)
Patient called asking if another lumbar epidural could be ordered for her? She said that she is noticing some pain has returned.   Please advise.  (Patient was scheduled with Dr Tamala Julian on 11/01 but that has been canceled).

## 2020-12-15 NOTE — Telephone Encounter (Signed)
Returned pt's call, no answer, left vm. AW  

## 2020-12-16 ENCOUNTER — Other Ambulatory Visit: Payer: Self-pay

## 2020-12-16 DIAGNOSIS — M5416 Radiculopathy, lumbar region: Secondary | ICD-10-CM

## 2020-12-16 NOTE — Telephone Encounter (Signed)
Epidural ordered. Patient notified.  

## 2020-12-21 ENCOUNTER — Ambulatory Visit: Payer: Managed Care, Other (non HMO) | Admitting: Family Medicine

## 2020-12-25 ENCOUNTER — Other Ambulatory Visit: Payer: Self-pay | Admitting: Internal Medicine

## 2020-12-27 ENCOUNTER — Other Ambulatory Visit: Payer: Self-pay | Admitting: Internal Medicine

## 2020-12-28 ENCOUNTER — Ambulatory Visit: Payer: Managed Care, Other (non HMO) | Admitting: Dermatology

## 2020-12-29 ENCOUNTER — Encounter: Payer: Self-pay | Admitting: Internal Medicine

## 2020-12-29 ENCOUNTER — Telehealth (INDEPENDENT_AMBULATORY_CARE_PROVIDER_SITE_OTHER): Payer: Managed Care, Other (non HMO) | Admitting: Internal Medicine

## 2020-12-29 DIAGNOSIS — M150 Primary generalized (osteo)arthritis: Secondary | ICD-10-CM

## 2020-12-29 DIAGNOSIS — M48061 Spinal stenosis, lumbar region without neurogenic claudication: Secondary | ICD-10-CM | POA: Diagnosis not present

## 2020-12-29 DIAGNOSIS — E871 Hypo-osmolality and hyponatremia: Secondary | ICD-10-CM

## 2020-12-29 DIAGNOSIS — M159 Polyosteoarthritis, unspecified: Secondary | ICD-10-CM

## 2020-12-29 MED ORDER — ZOLPIDEM TARTRATE 10 MG PO TABS: 10.0000 mg | ORAL_TABLET | Freq: Every evening | ORAL | 2 refills | Status: DC | PRN

## 2020-12-29 MED ORDER — HYDROCODONE-ACETAMINOPHEN 10-325 MG PO TABS: 1.0000 | ORAL_TABLET | Freq: Four times a day (QID) | ORAL | 0 refills | Status: DC | PRN

## 2020-12-29 MED ORDER — PREDNISONE 10 MG PO TABS: ORAL_TABLET | ORAL | 0 refills | Status: DC

## 2020-12-29 NOTE — Assessment & Plan Note (Signed)
6 day prednisone taper to manage flare of joint pain involving left thumb (visibly swollen) , shoulders and neck that started one week ago

## 2020-12-29 NOTE — Assessment & Plan Note (Signed)
Secondary to SIADH.  Resolved with sodium supplementation 1 gram bid.  She denies fluid retention   Lab Results  Component Value Date   NA 137 10/29/2020   K 3.8 10/29/2020   CL 100 10/29/2020   CO2 25 10/29/2020

## 2020-12-29 NOTE — Progress Notes (Signed)
Virtual Visit via [place] Note  This visit type was conducted due to national recommendations for restrictions regarding the COVID-19 pandemic (e.g. social distancing).  This format is felt to be most appropriate for this patient at this time.  All issues noted in this document were discussed and addressed.  No physical exam was performed (except for noted visual exam findings with Video Visits).   I connected withNAME@ on 12/29/20 at  4:30 PM EST by a video enabled telemedicine application and verified that I am speaking with the correct person using two identifiers. Location patient: home Location provider: work or home office Persons participating in the virtual visit: patient, provider  I discussed the limitations, risks, security and privacy concerns of performing an evaluation and management service by telephone and the availability of in person appointments. I also discussed with the patient that there may be a patient responsible charge related to this service. The patient expressed understanding and agreed to proceed.  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 Refill history confirmed via [place] Controlled Substance databas, accessed by me today..   She has chronic insomnia managed with ambien  10 mg daily,  but insurance has required PA for 30 day supply.    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   Prepatellar effusion of right knee 06/09/2019   Aspiration done June 13, 2019   Scoliosis    Spinal stenosis of lumbar region    Squamous cell carcinoma of skin 06/24/2018   R lateral calf   Tubular adenoma of colon     Past Surgical History:  Procedure Laterality Date   ABDOMINAL HYSTERECTOMY     APPENDECTOMY     CARDIAC CATHETERIZATION     normal coronaries, no wall motion abnormalities 11/02/09 [facility])   CATARACT EXTRACTION W/PHACO Left 11/24/2020   Procedure: CATARACT EXTRACTION PHACO AND INTRAOCULAR LENS PLACEMENT ([place]) LEFT 6.09 00:46.7;  Surgeon: [name], MD;  Location: [facility];  Service: Ophthalmology;  Laterality: Left;  patient wants early   CATARACT EXTRACTION W/PHACO Right 12/08/2020   Procedure: CATARACT EXTRACTION PHACO AND INTRAOCULAR LENS PLACEMENT ([facility]) RIGHT 3.92 00:49.0;  Surgeon: [name], MD;  Location: [place];  Service: Ophthalmology;  Laterality: Right;  patient request early   NASAL SINUS SURGERY     OOPHORECTOMY     RADIOLOGY WITH ANESTHESIA  02/26/2012   Procedure: RADIOLOGY WITH ANESTHESIA;  Surgeon: [name], MD;  Location: [place];  Service: Radiology;  Laterality: N/A;   REVERSE SHOULDER ARTHROPLASTY Right 06/27/2013   Procedure: REVERSE RIGHT TOTAL  SHOULDER ARTHROPLASTY;  Surgeon: [name], MD;  Location: [place];  Service: Orthopedics;  Laterality: Right;   SHOULDER ARTHROSCOPY WITH ROTATOR CUFF REPAIR AND SUBACROMIAL DECOMPRESSION  2007   left   TONSILLECTOMY  adnoids    Family History  Problem Relation Age of Onset   Colon cancer Father 41   Diabetes Father    Heart disease Father    Rectal cancer Father    Thyroid disease Mother     Colon cancer Paternal Grandfather    Colon cancer Other        pggm   Breast cancer Paternal Grandmother    Stomach cancer Neg Hx     SOCIAL HX:  reports that she has never smoked. She has never used smokeless tobacco. She reports that she does not currently use alcohol after a past usage of about 2.0 standard drinks per night. She reports that she does not use drugs.    Current Outpatient Medications:    amLODipine (NORVASC) 10 MG tablet, TAKE 1 TABLET BY MOUTH EVERY DAY, Disp: 90 tablet, Rfl: 1   buPROPion (WELLBUTRIN XL) 150 MG 24 hr tablet, Take 1 tablet (150 mg total) by mouth every morning., Disp: 90 tablet, Rfl: 1   Cholecalciferol (VITAMIN D3) 50 MCG (2000 UT) CAPS, Take 1 capsule by mouth daily., Disp: , Rfl:    cyanocobalamin (,VITAMIN B-12,) 1000 MCG/ML injection, INJECT 1 ML (1,000 MCG TOTAL) INTO THE MUSCLE ONCE A WEEK. (Patient taking differently: Inject 1,000 mcg into the muscle every 30 (thirty) days.), Disp: 12 mL, Rfl: 6   dicyclomine (BENTYL) 20 MG tablet, TAKE 1 TABLET (20 MG TOTAL) BY MOUTH 3 (THREE) TIMES DAILY BEFORE MEALS., Disp: 90 tablet, Rfl: 1   esomeprazole (NEXIUM) 20 MG capsule, TAKE 1 CAPSULE BY MOUTH EVERY DAY, Disp: 90 capsule, Rfl: 0   fluticasone (FLONASE) 50 MCG/ACT nasal spray, USE 2 SPRAYS IN EACH NOSTRIL DAILY AS NEEDED (Patient taking differently: Place 2 sprays into both nostrils daily as needed for allergies or rhinitis. USE 2 SPRAYS IN EACH NOSTRIL DAILY AS NEEDED), Disp: 16 g, Rfl: 1   furosemide (LASIX) 20 MG tablet, TAKE 1 TABLET (20 MG TOTAL) BY MOUTH DAILY WITH BREAKFAST. AS NEEDED FOR EDEMA, Disp: 90 tablet, Rfl: 1   linaclotide (LINZESS) 145 MCG CAPS capsule, Take 1 capsule (145 mcg total) by mouth daily before breakfast., Disp: 90 capsule, Rfl: 1   metoprolol succinate (TOPROL-XL) 25 MG 24 hr tablet, TAKE 1 TABLET BY MOUTH EVERY DAY, Disp: 90 tablet, Rfl: 1   Multiple Vitamin (MULTIVITAMIN) tablet, Take 1 tablet by mouth daily., Disp: , Rfl:     predniSONE (DELTASONE) 10 MG tablet, 6 tablets on Day 1 , then reduce by 1 tablet daily until gone, Disp: 21 tablet, Rfl: 0   sodium chloride 1 g tablet, Take 1 tablet (1 g total) by mouth 2 (two) times daily with a meal., Disp: 60 tablet, Rfl: 2   Syringe/Needle, Disp, (SYRINGE 3CC/25GX1") 25G X 1" 3 ML MISC, Use for b12 injections, Disp: 50 each, Rfl: 0   diazepam (VALIUM) 5 MG tablet, One tab by mouth, 2 hours before procedure. (Patient not taking: Reported on 12/29/2020), Disp: 2 tablet, Rfl: 0   HYDROcodone-acetaminophen (NORCO) 10-325 MG tablet, Take 1 tablet by mouth every 6 (six) hours as needed., Disp: 120 tablet, Rfl: 0   HYDROcodone-acetaminophen (NORCO) 10-325 MG tablet, Take 1 tablet by mouth every 6 (six) hours as needed., Disp: 120 tablet, Rfl: 0   HYDROcodone-acetaminophen (  NORCO) 10-325 MG tablet, Take 1 tablet by mouth every 6 (six) hours as needed for severe pain., Disp: 120 tablet, Rfl: 0   rifaximin (XIFAXAN) 550 MG TABS tablet, Take 1 tablet (550 mg total) by mouth 3 (three) times daily. (Patient not taking: Reported on 12/29/2020), Disp: 42 tablet, Rfl: 0   zolpidem (AMBIEN) 10 MG tablet, Take 1 tablet (10 mg total) by mouth at bedtime as needed. for sleep, Disp: 30 tablet, Rfl: 2  EXAM:  VITALS per patient if applicable:  GENERAL: alert, oriented, appears well and in no acute distress  HEENT: atraumatic, conjunttiva clear, no obvious abnormalities on inspection of external nose and ears  NECK: normal movements of the head and neck  LUNGS: on inspection no signs of respiratory distress, breathing rate appears normal, no obvious gross SOB, gasping or wheezing  CV: no obvious cyanosis  MS: moves all visible extremities ,  left thumb joint swollen without noticeable abnormality  PSYCH/NEURO: pleasant and cooperative, no obvious depression or anxiety, speech and thought processing grossly intact  ASSESSMENT AND PLAN:  Discussed the following assessment and  plan:  Primary osteoarthritis involving multiple joints  Hyponatremia  Spinal stenosis of lumbar region without neurogenic claudication  Degenerative joint disease involving multiple joints 6 day prednisone taper to manage flare of joint pain involving left thumb (visibly swollen) , shoulders and neck that started one week ago  Hyponatremia Secondary to SIADH.  Resolved with sodium supplementation 1 gram bid.  She denies fluid retention   Lab Results  Component Value Date   NA 137 10/29/2020   K 3.8 10/29/2020   CL 100 10/29/2020   CO2 25 10/29/2020     Spinal stenosis of lumbar region Managed with a standing desk for work mode and opioids for non operable pain.  She has not had any ER visits for pain management and has not requested any early refills.  Her Refill history was confirmed via [place] Controlled Substance database by me today during her visit and there have been no prescriptions of controlled substances filled from any providers other than me. Refilling for 3 months     I discussed the assessment and treatment plan with the patient. The patient was provided an opportunity to ask questions and all were answered. The patient agreed with the plan and demonstrated an understanding of the instructions.   The patient was advised to call back or seek an in-person evaluation if the symptoms worsen or if the condition fails to improve as anticipated.   I spent 30 minutes dedicated to the care of this patient on the date of this encounter to include pre-visit review of his medical history,  Face-to-face time with the patient , and post visit ordering of testing and therapeutics.    [name], MD

## 2020-12-29 NOTE — Assessment & Plan Note (Signed)
Managed with a standing desk for work mode and opioids for non operable pain.  She has not had any ER visits for pain management and has not requested any early refills.  Her Refill history was confirmed via  Controlled Substance database by me today during her visit and there have been no prescriptions of controlled substances filled from any providers other than me. Refilling for 3 months  

## 2021-01-05 ENCOUNTER — Other Ambulatory Visit: Payer: Managed Care, Other (non HMO)

## 2021-01-18 NOTE — Telephone Encounter (Signed)
Refilled: 12/29/2020 Last OV: 12/29/2020 Next OV: not scheduled

## 2021-01-18 NOTE — Addendum Note (Signed)
Addended by: Adair Laundry on: 01/18/2021 04:59 PM   Modules accepted: Orders

## 2021-01-19 MED ORDER — HYDROCODONE-ACETAMINOPHEN 10-325 MG PO TABS: 1.0000 | ORAL_TABLET | Freq: Four times a day (QID) | ORAL | 0 refills | Status: DC | PRN

## 2021-01-21 ENCOUNTER — Other Ambulatory Visit: Payer: Self-pay | Admitting: Internal Medicine

## 2021-01-24 ENCOUNTER — Telehealth: Payer: Managed Care, Other (non HMO) | Admitting: Internal Medicine

## 2021-01-26 ENCOUNTER — Ambulatory Visit: Payer: Managed Care, Other (non HMO) | Admitting: Dermatology

## 2021-02-01 ENCOUNTER — Telehealth: Payer: Self-pay | Admitting: Internal Medicine

## 2021-02-01 NOTE — Telephone Encounter (Signed)
Pt called back stating not to worry about the previous message. Pt spoke to pharmacy and they were able to the medication ordered. It will be ready for pt tomorrow 12/14

## 2021-02-01 NOTE — Telephone Encounter (Signed)
Noted  

## 2021-02-01 NOTE — Telephone Encounter (Signed)
Pt called in stating that [facility] pharmacy is out of medication (HYDROcodone-acetaminophen (NORCO) 10-325 MG tablet). Pt stated that pharmacy advise her to contact provider office to see if Pt doctor can send another script over to another pharmacy. Pt is requesting for script to be sent to [facility] on [address] in [place]. Pt is requesting callback with update that the script was sent over.

## 2021-02-07 ENCOUNTER — Other Ambulatory Visit: Payer: Self-pay

## 2021-02-07 ENCOUNTER — Ambulatory Visit
Admission: RE | Admit: 2021-02-07 | Discharge: 2021-02-07 | Disposition: A | Discharge status: Home / Self Care | Payer: Managed Care, Other (non HMO) | Source: Ambulatory Visit | Attending: Family Medicine | Admitting: Family Medicine

## 2021-02-07 ENCOUNTER — Other Ambulatory Visit: Payer: Self-pay | Admitting: Internal Medicine

## 2021-02-07 DIAGNOSIS — M5416 Radiculopathy, lumbar region: Secondary | ICD-10-CM

## 2021-02-07 MED ORDER — METHYLPREDNISOLONE ACETATE 40 MG/ML INJ SUSP (RADIOLOG
80.0000 mg | Freq: Once | INTRAMUSCULAR | Status: AC
  Administered 2021-02-07: 08:00:00 80 mg via EPIDURAL

## 2021-02-07 MED ORDER — IOPAMIDOL (ISOVUE-M 300) INJECTION 61%
1.0000 mL | Freq: Once | INTRAMUSCULAR | Status: AC | PRN
  Administered 2021-02-07: 08:00:00 1 mL via EPIDURAL

## 2021-02-07 NOTE — Discharge Instructions (Signed)

## 2021-02-08 ENCOUNTER — Encounter: Payer: Self-pay | Admitting: Internal Medicine

## 2021-02-08 MED ORDER — HYDROCODONE-ACETAMINOPHEN 10-325 MG PO TABS: 1.0000 | ORAL_TABLET | Freq: Four times a day (QID) | ORAL | 0 refills | Status: DC | PRN

## 2021-02-08 NOTE — Telephone Encounter (Signed)
Spoke with Adrienne Robinson and she stated that Dr Derrel Nip has already handled this. Please see mychart message.

## 2021-02-08 NOTE — Telephone Encounter (Signed)
Pt called in requesting to speak with you about medication (HYDROcodone-acetaminophen (NORCO) 10-325 MG tablet). Pt stated that medication is on back order. Pt requesting callback

## 2021-02-13 ENCOUNTER — Other Ambulatory Visit: Payer: Self-pay | Admitting: Internal Medicine

## 2021-03-01 ENCOUNTER — Encounter: Payer: Self-pay | Admitting: Internal Medicine

## 2021-03-01 MED ORDER — HYDROCODONE-ACETAMINOPHEN 10-325 MG PO TABS: 1.0000 | ORAL_TABLET | Freq: Four times a day (QID) | ORAL | 0 refills | Status: DC | PRN

## 2021-03-01 NOTE — Telephone Encounter (Signed)
Refilled: 02/14/2021 Last OV: 12/29/2020 Next OV: 03/25/2021

## 2021-03-12 ENCOUNTER — Other Ambulatory Visit: Payer: Self-pay | Admitting: Internal Medicine

## 2021-03-20 ENCOUNTER — Other Ambulatory Visit: Payer: Self-pay | Admitting: Internal Medicine

## 2021-03-22 ENCOUNTER — Ambulatory Visit: Payer: Managed Care, Other (non HMO) | Admitting: Dermatology

## 2021-03-25 ENCOUNTER — Encounter: Payer: Self-pay | Admitting: Internal Medicine

## 2021-03-25 ENCOUNTER — Telehealth (INDEPENDENT_AMBULATORY_CARE_PROVIDER_SITE_OTHER): Payer: Self-pay | Admitting: Internal Medicine

## 2021-03-25 VITALS — BP 140/85 | Ht 66.0 in | Wt 118.0 lb

## 2021-03-25 DIAGNOSIS — D708 Other neutropenia: Secondary | ICD-10-CM

## 2021-03-25 DIAGNOSIS — E781 Pure hyperglyceridemia: Secondary | ICD-10-CM

## 2021-03-25 DIAGNOSIS — M48061 Spinal stenosis, lumbar region without neurogenic claudication: Secondary | ICD-10-CM

## 2021-03-25 DIAGNOSIS — K581 Irritable bowel syndrome with constipation: Secondary | ICD-10-CM

## 2021-03-25 DIAGNOSIS — I10 Essential (primary) hypertension: Secondary | ICD-10-CM

## 2021-03-25 DIAGNOSIS — F418 Other specified anxiety disorders: Secondary | ICD-10-CM

## 2021-03-25 MED ORDER — ZOLPIDEM TARTRATE 10 MG PO TABS: 10.0000 mg | ORAL_TABLET | Freq: Every evening | ORAL | 5 refills | Status: DC | PRN

## 2021-03-25 MED ORDER — HYDROCODONE-ACETAMINOPHEN 10-325 MG PO TABS: 1.0000 | ORAL_TABLET | Freq: Four times a day (QID) | ORAL | 0 refills | Status: DC | PRN

## 2021-03-25 MED ORDER — PROPRANOLOL HCL 10 MG PO TABS: 5.0000 mg | ORAL_TABLET | ORAL | 0 refills | Status: DC | PRN

## 2021-03-25 NOTE — Assessment & Plan Note (Signed)
Secondary to presentations for work.  Propranolol 5 mg prn.  Prior trials of buspirone caused dry mouth.  Alprazolam C/i due to chronic opioid use and nightly use of ambien

## 2021-03-25 NOTE — Assessment & Plan Note (Signed)
Managed with Linzess, which she has taken for years ,  Previous trials of amitiza were ineffective

## 2021-03-25 NOTE — Progress Notes (Signed)
Virtual Visit via [place] Note  This visit type was conducted due to national recommendations for restrictions regarding the COVID-19 pandemic (e.g. social distancing).  This format is felt to be most appropriate for this patient at this time.  All issues noted in this document were discussed and addressed.  No physical exam was performed (except for noted visual exam findings with Video Visits).   I connected withNAME@ on 03/25/21 at  4:30 PM EST by a video enabled telemedicine application and verified that I am speaking with the correct person using two identifiers. Location patient: home Location provider: work or home office Persons participating in the virtual visit: patient, provider  I discussed the limitations, risks, security and privacy concerns of performing an evaluation and management service by telephone and the availability of in person appointments. I also discussed with the patient that there may be a patient responsible charge related to this service. The patient expressed understanding and agreed to proceed.   Reason for visit: medication refill for chronic musculoskeletal pain   HPI:  66 yr old female with chronic MSK pain secondary to lumbar spinal stenosis  managed with hydrocodone 10/325 .  She feels generally well,  but notes that her pain is aggravated by cold weather .  She has not had any ER visits and has not requested any early refills.  Her Refill history was confirmed via [place] Controlled Substance database by me today during her visit and there have been no prescriptions of controlled substances filled from any providers other than me. .  Hypertension: patient checks blood pressure twice weekly at home.  Readings have been for the most part <120/70 at rest . Patient is managing her chronic hyponatremia with salt tablets  and is taking medications as prescribed .  Anxiety:  she is managing her husband's peritoneal dialysis and disability and continue to work full  time in a highly demanding job.  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      ROS: See pertinent positives and negatives per HPI.  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   Prepatellar effusion of right knee 06/09/2019   Aspiration done June 13, 2019   Scoliosis    Spinal stenosis of lumbar region    Squamous cell carcinoma of skin 06/24/2018   R lateral calf   Tubular adenoma of colon     Past Surgical History:  Procedure Laterality Date   ABDOMINAL HYSTERECTOMY     APPENDECTOMY     CARDIAC CATHETERIZATION     normal coronaries, no wall motion abnormalities 11/02/09 [facility])   CATARACT EXTRACTION W/PHACO Left 11/24/2020   Procedure: CATARACT EXTRACTION PHACO AND INTRAOCULAR LENS PLACEMENT ([place]) LEFT 6.09 00:46.7;  Surgeon: [name], MD;  Location: [place];  Service: Ophthalmology;  Laterality: Left;  patient wants early   CATARACT EXTRACTION W/PHACO Right 12/08/2020   Procedure: CATARACT EXTRACTION PHACO AND INTRAOCULAR LENS PLACEMENT ([facility]) RIGHT 3.92 00:49.0;  Surgeon: [name], MD;  Location: [facility];  Service: Ophthalmology;  Laterality: Right;  patient request early   NASAL SINUS SURGERY     OOPHORECTOMY     RADIOLOGY WITH ANESTHESIA  02/26/2012   Procedure: RADIOLOGY WITH ANESTHESIA;  Surgeon: [name], MD;  Location: [place];  Service: Radiology;  Laterality: N/A;   REVERSE SHOULDER ARTHROPLASTY Right 06/27/2013   Procedure: REVERSE RIGHT TOTAL SHOULDER ARTHROPLASTY;   Surgeon: [name], MD;  Location: [place];  Service: Orthopedics;  Laterality: Right;   SHOULDER ARTHROSCOPY WITH ROTATOR CUFF REPAIR AND SUBACROMIAL DECOMPRESSION  2007   left   TONSILLECTOMY  adnoids    Family History  Problem Relation Age of Onset   Colon cancer Father 16   Diabetes Father    Heart disease Father    Rectal cancer Father    Thyroid disease Mother    Colon cancer Paternal Grandfather    Colon cancer Other        pggm   Breast cancer Paternal Grandmother    Stomach cancer Neg Hx     SOCIAL HX:  reports that she has never smoked. She has never used smokeless tobacco. She reports that she does not currently use alcohol after a past usage of about 2.0 standard drinks per week. She reports that she does not use drugs.    Current Outpatient Medications:    amLODipine (NORVASC) 10 MG tablet, TAKE 1 TABLET BY MOUTH EVERY DAY, Disp: 90 tablet, Rfl: 1   buPROPion (WELLBUTRIN XL) 150 MG 24 hr tablet, TAKE 1 TABLET BY MOUTH EVERY DAY IN THE MORNING, Disp: 90 tablet, Rfl: 1   Cholecalciferol (VITAMIN D3) 50 MCG (2000 UT) CAPS, Take 1 capsule by mouth daily., Disp: , Rfl:    cyanocobalamin (,VITAMIN B-12,) 1000 MCG/ML injection, INJECT 1 ML (1,000 MCG TOTAL) INTO THE MUSCLE ONCE A WEEK. (Patient taking differently: Inject 1,000 mcg into the muscle every 30 (thirty) days.), Disp: 12 mL, Rfl: 6   dicyclomine (BENTYL) 20 MG tablet, TAKE 1 TABLET (20 MG TOTAL) BY MOUTH 3 (THREE) TIMES DAILY BEFORE MEALS., Disp: 90 tablet, Rfl: 1   esomeprazole (NEXIUM) 20 MG capsule, TAKE 1 CAPSULE BY MOUTH EVERY DAY, Disp: 90 capsule, Rfl: 0   fluticasone (FLONASE) 50 MCG/ACT nasal spray, USE 2 SPRAYS IN EACH NOSTRIL DAILY AS NEEDED (Patient taking differently: Place 2 sprays into both nostrils daily as needed for allergies or rhinitis. USE 2 SPRAYS IN EACH NOSTRIL DAILY AS NEEDED), Disp: 16 g, Rfl: 1   furosemide (LASIX) 20 MG tablet, TAKE 1 TABLET (20 MG TOTAL) BY MOUTH DAILY WITH BREAKFAST. AS  NEEDED FOR EDEMA, Disp: 90 tablet, Rfl: 1   HYDROcodone-acetaminophen (NORCO) 10-325 MG tablet, Take 1 tablet by mouth every 6 (six) hours as needed., Disp: 68 tablet, Rfl: 0   LINZESS 145 MCG CAPS capsule, TAKE 1 CAPSULE BY MOUTH DAILY BEFORE BREAKFAST., Disp: 90 capsule, Rfl: 1   metoprolol succinate (TOPROL-XL) 25 MG 24 hr tablet, TAKE 1 TABLET BY MOUTH EVERY DAY, Disp: 90 tablet, Rfl: 1   Multiple Vitamin (MULTIVITAMIN) tablet, Take 1 tablet by mouth daily., Disp: , Rfl:    propranolol (INDERAL) 10 MG tablet, Take 0.5 tablets (5 mg total) by mouth as needed. As needed for anxiety, Disp: 30 tablet, Rfl: 0   rifaximin (XIFAXAN) 550 MG TABS tablet, Take 1 tablet (550 mg total) by mouth 3 (three) times daily., Disp: 42 tablet, Rfl: 0   sodium chloride 1 g tablet, Take 1 tablet (1 g total)  by mouth 2 (two) times daily with a meal., Disp: 60 tablet, Rfl: 2   Syringe/Needle, Disp, (SYRINGE 3CC/25GX1") 25G X 1" 3 ML MISC, Use for b12 injections, Disp: 50 each, Rfl: 0   HYDROcodone-acetaminophen (NORCO) 10-325 MG tablet, Take 1 tablet by mouth every 6 (six) hours as needed., Disp: 120 tablet, Rfl: 0   HYDROcodone-acetaminophen (NORCO) 10-325 MG tablet, Take 1 tablet by mouth every 6 (six) hours as needed., Disp: 120 tablet, Rfl: 0   HYDROcodone-acetaminophen (NORCO) 10-325 MG tablet, Take 1 tablet by mouth every 6 (six) hours as needed., Disp: 120 tablet, Rfl: 0   zolpidem (AMBIEN) 10 MG tablet, Take 1 tablet (10 mg total) by mouth at bedtime as needed. for sleep, Disp: 30 tablet, Rfl: 5  EXAM:  VITALS per patient if applicable:  GENERAL: alert, oriented, appears well and in no acute distress  HEENT: atraumatic, conjunttiva clear, no obvious abnormalities on inspection of external nose and ears  NECK: normal movements of the head and neck  LUNGS: on inspection no signs of respiratory distress, breathing rate appears normal, no obvious gross SOB, gasping or wheezing  CV: no obvious  cyanosis  MS: moves all visible extremities without noticeable abnormality  PSYCH/NEURO: pleasant and cooperative, no obvious depression or anxiety, speech and thought processing grossly intact  ASSESSMENT AND PLAN:  Discussed the following assessment and plan:  Hypertriglyceridemia - Plan: Lipid panel  Irritable bowel syndrome with constipation  Performance anxiety  Primary hypertension - Plan: Comprehensive metabolic panel, Microalbumin / creatinine urine ratio  Spinal stenosis of lumbar region without neurogenic claudication  Other neutropenia (HCC) - Plan: CBC with Differential/Platelet  Anxiety associated with depression  IBS (irritable bowel syndrome) Managed with Linzess, which she has taken for years ,  Previous trials of amitiza were ineffective  Performance anxiety Secondary to presentations for work.  Propranolol 5 mg prn.  Prior trials of buspirone caused dry mouth.  Alprazolam C/i due to chronic opioid use and nightly use of ambien   Hypertension Well controlled on amlodipine 10 mg daily  by home readings . Marland Kitchen Renal function and electrolytes have normalized,  no changes today.  Lab Results  Component Value Date   CREATININE 1.03 10/29/2020   Lab Results  Component Value Date   NA 137 10/29/2020   K 3.8 10/29/2020   CL 100 10/29/2020   CO2 25 10/29/2020     Spinal stenosis of lumbar region Managed with a standing desk for work mode and opioids for non operable pain.  She has not had any ER visits for pain management and has not requested any early refills.  Her Refill history was confirmed via [place] Controlled Substance database by me today during her visit and there have been no prescriptions of controlled substances filled from any providers other than me. Refilling for 3 months   Other neutropenia ([place]) Chronic since 2013..  S/p hematology evaluation ..she has no history of recurrent infections   Anxiety associated with depression Mood and affect are  surprisingly upbeat despite the stress she is feeling.  No changes to regimen  except to add propranolol for prn performance anxiety    I discussed the assessment and treatment plan with the patient. The patient was provided an opportunity to ask questions and all were answered. The patient agreed with the plan and demonstrated an understanding of the instructions.   The patient was advised to call back or seek an in-person evaluation if the symptoms worsen or if the condition fails  to improve as anticipated.   I spent 30 minutes dedicated to the care of this patient on the date of this encounter to include pre-visit review of his medical history,  Face-to-face time with the patient , and post visit ordering of testing and therapeutics.    [name], MD

## 2021-03-27 NOTE — Assessment & Plan Note (Signed)
Well controlled on amlodipine 10 mg daily  by home readings . Marland Kitchen Renal function and electrolytes have normalized,  no changes today.  Lab Results  Component Value Date   CREATININE 1.03 10/29/2020   Lab Results  Component Value Date   NA 137 10/29/2020   K 3.8 10/29/2020   CL 100 10/29/2020   CO2 25 10/29/2020

## 2021-03-27 NOTE — Assessment & Plan Note (Addendum)
Chronic since 2013..  S/p hematology evaluation ..she has no history of recurrent infections

## 2021-03-27 NOTE — Assessment & Plan Note (Signed)
Managed with a standing desk for work mode and opioids for non operable pain.  She has not had any ER visits for pain management and has not requested any early refills.  Her Refill history was confirmed via [place] Controlled Substance database by me today during her visit and there have been no prescriptions of controlled substances filled from any providers other than me. Refilling for 3 months  

## 2021-03-27 NOTE — Assessment & Plan Note (Signed)
Mood and affect are surprisingly upbeat despite the stress she is feeling.  No changes to regimen  except to add propranolol for prn performance anxiety

## 2021-04-13 ENCOUNTER — Other Ambulatory Visit: Payer: Self-pay | Admitting: Internal Medicine

## 2021-04-13 ENCOUNTER — Ambulatory Visit: Payer: Managed Care, Other (non HMO) | Admitting: Dermatology

## 2021-04-14 ENCOUNTER — Other Ambulatory Visit: Payer: Self-pay | Admitting: Internal Medicine

## 2021-04-18 ENCOUNTER — Encounter: Payer: Self-pay | Admitting: Internal Medicine

## 2021-04-18 MED ORDER — HYDROCODONE-ACETAMINOPHEN 10-325 MG PO TABS: 1.0000 | ORAL_TABLET | Freq: Four times a day (QID) | ORAL | 0 refills | Status: DC | PRN

## 2021-04-21 ENCOUNTER — Other Ambulatory Visit: Payer: Self-pay | Admitting: Internal Medicine

## 2021-04-27 ENCOUNTER — Other Ambulatory Visit: Payer: Self-pay | Admitting: Internal Medicine

## 2021-05-08 ENCOUNTER — Other Ambulatory Visit: Payer: Self-pay | Admitting: Internal Medicine

## 2021-05-09 ENCOUNTER — Encounter: Payer: Self-pay | Admitting: Dermatology

## 2021-05-15 ENCOUNTER — Other Ambulatory Visit: Payer: Self-pay | Admitting: Internal Medicine

## 2021-05-16 ENCOUNTER — Other Ambulatory Visit: Payer: Self-pay | Admitting: Internal Medicine

## 2021-06-02 ENCOUNTER — Other Ambulatory Visit (INDEPENDENT_AMBULATORY_CARE_PROVIDER_SITE_OTHER): Payer: Managed Care, Other (non HMO)

## 2021-06-02 DIAGNOSIS — I10 Essential (primary) hypertension: Secondary | ICD-10-CM | POA: Diagnosis not present

## 2021-06-02 DIAGNOSIS — E781 Pure hyperglyceridemia: Secondary | ICD-10-CM | POA: Diagnosis not present

## 2021-06-02 DIAGNOSIS — D708 Other neutropenia: Secondary | ICD-10-CM

## 2021-06-02 LAB — LIPID PANEL
Cholesterol: 171 mg/dL (ref 0–200)
HDL: 53.6 mg/dL (ref >39.00)
LDL Cholesterol: 93 mg/dL (ref 0–99)
NonHDL: 117.36
Total CHOL/HDL Ratio: 3
Triglycerides: 120 mg/dL (ref 0.0–149.0)
VLDL: 24 mg/dL (ref 0.0–40.0)

## 2021-06-02 LAB — CBC WITH DIFFERENTIAL/PLATELET
Basophils Absolute: 0 10*3/uL (ref 0.0–0.1)
Basophils Relative: 0.6 % (ref 0.0–3.0)
Eosinophils Absolute: 0.1 10*3/uL (ref 0.0–0.7)
Eosinophils Relative: 1.8 % (ref 0.0–5.0)
HCT: 30.9 % — ABNORMAL LOW (ref 36.0–46.0)
Hemoglobin: 10.3 g/dL — ABNORMAL LOW (ref 12.0–15.0)
Lymphocytes Relative: 27.7 % (ref 12.0–46.0)
Lymphs Abs: 1.3 10*3/uL (ref 0.7–4.0)
MCHC: 33.5 g/dL (ref 30.0–36.0)
MCV: 99.2 fl (ref 78.0–100.0)
Monocytes Absolute: 0.6 10*3/uL (ref 0.1–1.0)
Monocytes Relative: 12.4 % — ABNORMAL HIGH (ref 3.0–12.0)
Neutro Abs: 2.8 10*3/uL (ref 1.4–7.7)
Neutrophils Relative %: 57.5 % (ref 43.0–77.0)
Platelets: 274 10*3/uL (ref 150.0–400.0)
RBC: 3.11 Mil/uL — ABNORMAL LOW (ref 3.87–5.11)
RDW: 12.5 % (ref 11.5–15.5)
WBC: 4.8 10*3/uL (ref 4.0–10.5)

## 2021-06-02 LAB — COMPREHENSIVE METABOLIC PANEL
ALT: 18 U/L (ref 0–35)
AST: 21 U/L (ref 0–37)
Albumin: 4.3 g/dL (ref 3.5–5.2)
Alkaline Phosphatase: 58 U/L (ref 39–117)
BUN: 15 mg/dL (ref 6–23)
CO2: 27 mEq/L (ref 19–32)
Calcium: 9 mg/dL (ref 8.4–10.5)
Chloride: 102 mEq/L (ref 96–112)
Creatinine, Ser: 0.97 mg/dL (ref 0.40–1.20)
GFR: 61.37 mL/min (ref >60.00)
Glucose, Bld: 88 mg/dL (ref 70–99)
Potassium: 3.9 mEq/L (ref 3.5–5.1)
Sodium: 136 mEq/L (ref 135–145)
Total Bilirubin: 0.5 mg/dL (ref 0.2–1.2)
Total Protein: 6.2 g/dL (ref 6.0–8.3)

## 2021-06-02 NOTE — Addendum Note (Signed)
Addended by: Leeanne Rio on: 06/02/2021 09:46 AM ? ? Modules accepted: Orders ? ?

## 2021-06-07 NOTE — Addendum Note (Signed)
Addended by: Leeanne Rio on: 06/07/2021 03:57 PM ? ? Modules accepted: Orders ? ?

## 2021-06-07 NOTE — Progress Notes (Signed)
?Charlann Boxer D.O. ?[facility] ?Adrienne Robinson ?Phone: [phone] ?Subjective:   ?I, Adrienne Robinson, am serving as a scribe for Dr. Hulan Saas. ? ?This visit occurred during the SARS-CoV-2 public health emergency.  Safety protocols were in place, including screening questions prior to the visit, additional usage of staff PPE, and extensive cleaning of exam room while observing appropriate contact time as indicated for disinfecting solutions.  ? ?I'm seeing this patient by the request  of:  Adrienne Mc, MD ? ?CC: left knee pain, thumb pain, back pain ? ?[id number]  ?Adrienne Robinson is a 66 y.o. female coming in with complaint of L knee pain.  This is a new problem for patient that we have not seen her for.  Patient states that she was taking out the trash the other day and felt a "twinge" in her knee. Pain throughout the entire joint. Notes at night that her pain radiates into the tib anterior and quad. Unable to sleep due to pain. Is curious if her knee pain is coming from lumbar spine.  ? ?Patient also requests injection for L CMC joint.  ? ? ?Past medical history has been significant for spinal stenosis of the back.  Has had a significant amount of musculoskeletal complaints and pain or patient does get hydrocodone from primary care provider.  Last epidural for her back was done in December of last year. ?  ? ?Past Medical History:  ?Diagnosis Date  ? Actinic keratosis 08/27/2019  ? Left popliteal fossa  ? Allergy   ? Arthritis   ? 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 s/p embolization  Feb 26 2012, [name]  ? B12 deficiency 01/2016  ? Candida esophagitis ([place])   ? Degenerative joint disease involving multiple joints   ? Depression   ? Dizziness   ? Hypertension   ? IBS (irritable bowel syndrome)   ? Pneumonia   ? approx 6 years ago  ? 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 Prepatellar effusion of right knee 06/09/2019  ? Aspiration done June 13, 2019  ?  Scoliosis   ? Spinal stenosis of lumbar region   ? Squamous cell carcinoma of skin 06/24/2018  ? R lateral calf  ? Tubular adenoma of colon   ? ?Past Surgical History:  ?Procedure Laterality Date  ? ABDOMINAL HYSTERECTOMY    ? APPENDECTOMY    ? CARDIAC CATHETERIZATION    ? normal coronaries, no wall motion abnormalities 11/02/09 [facility])  ? CATARACT EXTRACTION W/PHACO Left 11/24/2020  ? Procedure: CATARACT EXTRACTION PHACO AND INTRAOCULAR LENS PLACEMENT ([facility]) LEFT 6.09 00:46.7;  Surgeon: [name], MD;  Location: [place];  Service: Ophthalmology;  Laterality: Left;  patient wants early  ? CATARACT EXTRACTION W/PHACO Right 12/08/2020  ? Procedure: CATARACT EXTRACTION PHACO AND INTRAOCULAR LENS PLACEMENT ([facility]) RIGHT 3.92 00:49.0;  Surgeon: [name], MD;  Location: [place];  Service: Ophthalmology;  Laterality: Right;  patient request early  ? NASAL SINUS SURGERY    ? OOPHORECTOMY    ? RADIOLOGY WITH ANESTHESIA  02/26/2012  ? Procedure: RADIOLOGY WITH ANESTHESIA;  Surgeon: [name], MD;  Location: [place];  Service: Radiology;  Laterality: N/A;  ? REVERSE SHOULDER ARTHROPLASTY Right 06/27/2013  ? Procedure: REVERSE RIGHT TOTAL SHOULDER ARTHROPLASTY;  Surgeon: [name], MD;  Location: [place];  Service: Orthopedics;  Laterality: Right;  ? SHOULDER ARTHROSCOPY WITH ROTATOR CUFF REPAIR AND SUBACROMIAL DECOMPRESSION  2007  ? left  ?  TONSILLECTOMY  adnoids  ? ?Social History  ? ?Socioeconomic History  ? Marital status: Married  ?  Spouse name: Not on file  ? Number of children: 0  ? Years of education: 30  ? Highest education level: Not on file  ?Occupational History  ? Occupation: Nurse executive   ?Tobacco Use  ? Smoking status: Never  ? Smokeless tobacco: Never  ?Vaping Use  ? Vaping Use: Never used  ?Substance and Sexual Activity  ? Alcohol use: Not Currently  ?  Alcohol/week: 2.0 standard drinks  ?  Types: 2 Glasses of wine per week  ?  Comment: 2-3 week with dinner   ? Drug use: No  ? Sexual activity: Not on file  ?Other Topics Concern  ? Not on file  ?Social History Narrative  ? Lives at home with her husband.  ? Right-handed.  ? 2-3 diet cokes per day.  ? ?Social Determinants of Health  ? ?Financial Resource Strain: Not on file  ?Food Insecurity: Not on file  ?Transportation Needs: Not on file  ?Physical Activity: Not on file  ?Stress: Not on file  ?Social Connections: Not on file  ? ?Allergies  ?Allergen Reactions  ? Buspirone Other (See Comments)  ?  Dizziness,  dry mouth   ? Celebrex [Celecoxib] Swelling  ? Adhesive [Tape] Rash and Other (See Comments)  ?  Redness, blisters, skin peeling off.  ? Morphine And Related Rash  ? ?Family History  ?Problem Relation Age of Onset  ? Colon cancer Father 11  ? Diabetes Father   ? Heart disease Father   ? Rectal cancer Father   ? Thyroid disease Mother   ? Colon cancer Paternal Grandfather   ? Colon cancer Other   ?     pggm  ? Breast cancer Paternal Grandmother   ? Stomach cancer Neg Hx   ? ? ? ?Current Outpatient Medications (Cardiovascular):  ?  amLODipine (NORVASC) 10 MG tablet, TAKE 1 TABLET BY MOUTH EVERY DAY ?  furosemide (LASIX) 20 MG tablet, TAKE 1 TABLET (20 MG TOTAL) BY MOUTH DAILY WITH BREAKFAST AS NEEDED FOR EDEMA ?  metoprolol succinate (TOPROL-XL) 25 MG 24 hr tablet, TAKE 1 TABLET BY MOUTH EVERY DAY ?  propranolol (INDERAL) 10 MG tablet, TAKE 1/2 TABLET BY MOUTH AS NEEDED FOR ANXIETY ? ?Current Outpatient Medications (Respiratory):  ?  fluticasone (FLONASE) 50 MCG/ACT nasal spray, USE 2 SPRAYS IN EACH NOSTRIL DAILY AS NEEDED (Patient taking differently: Place 2 sprays into both nostrils daily as needed for allergies or rhinitis. USE 2 SPRAYS IN EACH NOSTRIL DAILY AS NEEDED) ? ?Current Outpatient Medications (Analgesics):  ?  HYDROcodone-acetaminophen (NORCO) 10-325 MG tablet, Take 1 tablet by mouth every 6 (six) hours as needed. ?  HYDROcodone-acetaminophen (NORCO) 10-325 MG tablet, Take 1 tablet by mouth every 6 (six)  hours as needed. ?  HYDROcodone-acetaminophen (NORCO) 10-325 MG tablet, Take 1 tablet by mouth every 6 (six) hours as needed. ?  HYDROcodone-acetaminophen (NORCO) 10-325 MG tablet, Take 1 tablet by mouth every 6 (six) hours as needed. ? ?Current Outpatient Medications (Hematological):  ?  cyanocobalamin (,VITAMIN B-12,) 1000 MCG/ML injection, INJECT 1 ML (1,000 MCG TOTAL) INTO THE MUSCLE ONCE A WEEK. (Patient taking differently: Inject 1,000 mcg into the muscle every 30 (thirty) days.) ? ?Current Outpatient Medications (Other):  ?  buPROPion (WELLBUTRIN XL) 150 MG 24 hr tablet, TAKE 1 TABLET BY MOUTH EVERY DAY IN THE MORNING ?  Cholecalciferol (VITAMIN D3) 50 MCG (2000 UT) CAPS, Take  1 capsule by mouth daily. ?  dicyclomine (BENTYL) 20 MG tablet, TAKE 1 TABLET (20 MG TOTAL) BY MOUTH 3 (THREE) TIMES DAILY BEFORE MEALS. ?  esomeprazole (NEXIUM) 20 MG capsule, Take 1 capsule (20 mg total) by mouth daily. **PLEASE CONTACT OFFICE TO SCHEDULE FOLLOW UP ?  LINZESS 145 MCG CAPS capsule, TAKE 1 CAPSULE BY MOUTH DAILY BEFORE BREAKFAST. ?  Multiple Vitamin (MULTIVITAMIN) tablet, Take 1 tablet by mouth daily. ?  rifaximin (XIFAXAN) 550 MG TABS tablet, Take 1 tablet (550 mg total) by mouth 3 (three) times daily. ?  sodium chloride 1 g tablet, Take 1 tablet (1 g total) by mouth 2 (two) times daily with a meal. ?  Syringe/Needle, Disp, (SYRINGE 3CC/25GX1") 25G X 1" 3 ML MISC, Use for b12 injections ?  zolpidem (AMBIEN) 10 MG tablet, Take 1 tablet (10 mg total) by mouth at bedtime as needed. for sleep ? ? ?Reviewed prior external information including notes and imaging from  ?primary care provider ?As well as notes that were available from care everywhere and other healthcare systems. ? ?Past medical history, social, surgical and family history all reviewed in electronic medical record.  No pertanent information unless stated regarding to the chief complaint.  ? ?Review of Systems: ? No headache, visual changes, nausea, vomiting,  diarrhea, constipation, dizziness, abdominal pain, skin rash, fevers, chills, night sweats, weight loss, swollen lymph nodes, body aches, joint swelling, chest pain, shortness of breath, mood changes.

## 2021-06-08 ENCOUNTER — Encounter: Payer: Self-pay | Admitting: Family Medicine

## 2021-06-08 ENCOUNTER — Ambulatory Visit (INDEPENDENT_AMBULATORY_CARE_PROVIDER_SITE_OTHER): Payer: Managed Care, Other (non HMO) | Admitting: Family Medicine

## 2021-06-08 ENCOUNTER — Ambulatory Visit: Payer: Self-pay

## 2021-06-08 ENCOUNTER — Ambulatory Visit (INDEPENDENT_AMBULATORY_CARE_PROVIDER_SITE_OTHER): Payer: Managed Care, Other (non HMO)

## 2021-06-08 VITALS — BP 118/72 | HR 68 | Ht 66.0 in | Wt 123.0 lb

## 2021-06-08 DIAGNOSIS — M1812 Unilateral primary osteoarthritis of first carpometacarpal joint, left hand: Secondary | ICD-10-CM

## 2021-06-08 DIAGNOSIS — M9903 Segmental and somatic dysfunction of lumbar region: Secondary | ICD-10-CM

## 2021-06-08 DIAGNOSIS — M9904 Segmental and somatic dysfunction of sacral region: Secondary | ICD-10-CM

## 2021-06-08 DIAGNOSIS — M25562 Pain in left knee: Secondary | ICD-10-CM

## 2021-06-08 DIAGNOSIS — S83012A Lateral subluxation of left patella, initial encounter: Secondary | ICD-10-CM | POA: Diagnosis not present

## 2021-06-08 DIAGNOSIS — M9902 Segmental and somatic dysfunction of thoracic region: Secondary | ICD-10-CM | POA: Diagnosis not present

## 2021-06-08 DIAGNOSIS — M48061 Spinal stenosis, lumbar region without neurogenic claudication: Secondary | ICD-10-CM | POA: Diagnosis not present

## 2021-06-08 LAB — MICROALBUMIN / CREATININE URINE RATIO
Creatinine,U: 11 mg/dL
Microalb Creat Ratio: 6.4 mg/g (ref 0.0–30.0)
Microalb, Ur: <0.7 mg/dL (ref 0.0–1.9)

## 2021-06-08 MED ORDER — TIZANIDINE HCL 4 MG PO TABS: 4.0000 mg | ORAL_TABLET | Freq: Every day | ORAL | 0 refills | Status: DC

## 2021-06-08 NOTE — Patient Instructions (Addendum)
Xray today ?Brace w activity ?Exercises ?Injections today ?See me again in 6-8 weeks ? ?

## 2021-06-08 NOTE — Assessment & Plan Note (Signed)
I believe the patient did have subluxation noted.  Discussed with patient icing regimen and home exercises, which activities to do which ones to avoid, patient will increase activity slowly.  Wear the brace on a regular basis.  Given an injection today as well secondary to the trace effusion.  We will follow-up again in 6 to 8 weeks ?

## 2021-06-08 NOTE — Assessment & Plan Note (Signed)
Chronic problem with exacerbation.  Discussed icing regimen and home exercises, discussed which activities to do.  Patient does respond well to manipulation.  Does have pain medication from primary care provider.  Follow-up with me again in 6 to 8 weeks ?

## 2021-06-08 NOTE — Assessment & Plan Note (Signed)
Chronic problem with exacerbation again.  Discussed icing regimen and home exercises, discussed bracing and increase activity slowly.  Follow-up again in 6 to 8 weeks ?

## 2021-06-10 ENCOUNTER — Telehealth (INDEPENDENT_AMBULATORY_CARE_PROVIDER_SITE_OTHER): Payer: Managed Care, Other (non HMO) | Admitting: Internal Medicine

## 2021-06-10 ENCOUNTER — Encounter: Payer: Self-pay | Admitting: Internal Medicine

## 2021-06-10 VITALS — BP 110/73 | Ht 66.0 in | Wt 116.0 lb

## 2021-06-10 DIAGNOSIS — F418 Other specified anxiety disorders: Secondary | ICD-10-CM

## 2021-06-10 DIAGNOSIS — F321 Major depressive disorder, single episode, moderate: Secondary | ICD-10-CM

## 2021-06-10 DIAGNOSIS — Z780 Asymptomatic menopausal state: Secondary | ICD-10-CM | POA: Diagnosis not present

## 2021-06-10 DIAGNOSIS — G894 Chronic pain syndrome: Secondary | ICD-10-CM | POA: Diagnosis not present

## 2021-06-10 DIAGNOSIS — Z1231 Encounter for screening mammogram for malignant neoplasm of breast: Secondary | ICD-10-CM

## 2021-06-10 DIAGNOSIS — E871 Hypo-osmolality and hyponatremia: Secondary | ICD-10-CM

## 2021-06-10 MED ORDER — HYDROCODONE-ACETAMINOPHEN 10-325 MG PO TABS: 1.0000 | ORAL_TABLET | Freq: Four times a day (QID) | ORAL | 0 refills | Status: DC | PRN

## 2021-06-10 NOTE — Progress Notes (Signed)
Virtual Visit via Caregility Note ? ?This visit type was conducted due to national recommendations for restrictions regarding the COVID-19 pandemic (e.g. social distancing).  This format is felt to be most appropriate for this patient at this time.  All issues noted in this document were discussed and addressed.  No physical exam was performed (except for noted visual exam findings with Video Visits).  ? ?I connected withNAME@ on 06/10/21 at  4:30 PM EDT by a video enabled telemedicine application and verified that I am speaking with the correct person using two identifiers. ?Location patient: home ?Location provider: work or home office ?Persons participating in the virtual visit: patient, provider ? ?I discussed the limitations, risks, security and privacy concerns of performing an evaluation and management service by telephone and the availability of in person appointments. I also discussed with the patient that there may be a patient responsible charge related to this service. The patient expressed understanding and agreed to proceed. ? ?Reason for visit: medication refill,  follow up on chronic issues ? ?HPI: ? ?66 yr old female with chronic MSK pain secondary to degenerative joint disease, scoliosis and lumbar spinal stenosis,    managed with hydrocodone 10/325  taken four times daily .  She has a history of bilateral shouleder surgeries,  most recently the right in 2015,  the left in 2007.   ? ?Over the past several month she has develop left thumb pain and left knee pain. Neither were due to a traumatic event.  She was treated by Dr Tamala Julian on April 19  and received a steroid injection in her [facility] joint and a steroid injection in her left knee .  Review of his notes indicates a subluxation of her left patella was diagnosed . ?  ?She feels generally well,  but notes that her pain is aggravated by cold weather .  She has not had any ER visits and has not requested any early refills.  Her Refill history was  confirmed via [place] Controlled Substance database by me today during her visit and there have been no prescriptions of controlled substances filled from any providers other than me. . ?  ?Hypertension: patient continues to check  blood pressure twice weekly at home.  Readings have been for the most part <120/70 at rest . Patient is managing her chronic hyponatremia with salt tablets  and is taking medications as prescribed . ?  ?IBS:  symptoms remain intermittent,  aggravated by increased stress.  No recent bloating or diarrhea. ? ?Anxiety/depression:  mood is stable . She is guardedly optimistic that one of Barry's relatives will be able and willing to donate a kidney to him .  He continues to require peritoneal dialysis   ? ? ?ROS: See pertinent positives and negatives per HPI. ? ?Past Medical History:  ?Diagnosis Date  ? Actinic keratosis 08/27/2019  ? Left popliteal fossa  ? Allergy   ? Arthritis   ? 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 s/p embolization  Feb 26 2012, [name]  ? B12 deficiency 01/2016  ? Candida esophagitis ([place])   ? Degenerative joint disease involving multiple joints   ? Depression   ? Dizziness   ? Hypertension   ? IBS (irritable bowel syndrome)   ? Pneumonia   ? approx 6 years ago  ? 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 Prepatellar effusion of right knee 06/09/2019  ? Aspiration done June 13, 2019  ? Scoliosis   ? Spinal stenosis of lumbar region   ?  Squamous cell carcinoma of skin 06/24/2018  ? R lateral calf  ? Tubular adenoma of colon   ? ? ?Past Surgical History:  ?Procedure Laterality Date  ? ABDOMINAL HYSTERECTOMY    ? APPENDECTOMY    ? CARDIAC CATHETERIZATION    ? normal coronaries, no wall motion abnormalities 11/02/09 [facility])  ? CATARACT EXTRACTION W/PHACO Left 11/24/2020  ? Procedure: CATARACT EXTRACTION PHACO AND INTRAOCULAR LENS PLACEMENT ([facility]) LEFT 6.09 00:46.7;  Surgeon: [name], MD;  Location: [place];  Service: Ophthalmology;  Laterality:  Left;  patient wants early  ? CATARACT EXTRACTION W/PHACO Right 12/08/2020  ? Procedure: CATARACT EXTRACTION PHACO AND INTRAOCULAR LENS PLACEMENT ([facility]) RIGHT 3.92 00:49.0;  Surgeon: [name], MD;  Location: [place];  Service: Ophthalmology;  Laterality: Right;  patient request early  ? NASAL SINUS SURGERY    ? OOPHORECTOMY    ? RADIOLOGY WITH ANESTHESIA  02/26/2012  ? Procedure: RADIOLOGY WITH ANESTHESIA;  Surgeon: [name], MD;  Location: [place];  Service: Radiology;  Laterality: N/A;  ? REVERSE SHOULDER ARTHROPLASTY Right 06/27/2013  ? Procedure: REVERSE RIGHT TOTAL SHOULDER ARTHROPLASTY;  Surgeon: [name], MD;  Location: [place];  Service: Orthopedics;  Laterality: Right;  ? SHOULDER ARTHROSCOPY WITH ROTATOR CUFF REPAIR AND SUBACROMIAL DECOMPRESSION  2007  ? left  ? TONSILLECTOMY  adnoids  ? ? ?Family History  ?Problem Relation Age of Onset  ? Colon cancer Father 43  ? Diabetes Father   ? Heart disease Father   ? Rectal cancer Father   ? Thyroid disease Mother   ? Colon cancer Paternal Grandfather   ? Colon cancer Other   ?     pggm  ? Breast cancer Paternal Grandmother   ? Stomach cancer Neg Hx   ? ? ?SOCIAL HX:  reports that she has never smoked. She has never used smokeless tobacco. She reports that she does not currently use alcohol after a past usage of about 2.0 standard drinks per week. She reports that she does not use drugs.  ? ?Current Outpatient Medications:  ?  amLODipine (NORVASC) 10 MG tablet, TAKE 1 TABLET BY MOUTH EVERY DAY, Disp: 90 tablet, Rfl: 1 ?  buPROPion (WELLBUTRIN XL) 150 MG 24 hr tablet, TAKE 1 TABLET BY MOUTH EVERY DAY IN THE MORNING, Disp: 90 tablet, Rfl: 1 ?  Cholecalciferol (VITAMIN D3) 50 MCG (2000 UT) CAPS, Take 1 capsule by mouth daily., Disp: , Rfl:  ?  cyanocobalamin (,VITAMIN B-12,) 1000 MCG/ML injection, INJECT 1 ML (1,000 MCG TOTAL) INTO THE MUSCLE ONCE A WEEK. (Patient taking differently: Inject 1,000 mcg into the muscle every 30 (thirty)  days.), Disp: 12 mL, Rfl: 6 ?  dicyclomine (BENTYL) 20 MG tablet, TAKE 1 TABLET (20 MG TOTAL) BY MOUTH 3 (THREE) TIMES DAILY BEFORE MEALS., Disp: 270 tablet, Rfl: 0 ?  esomeprazole (NEXIUM) 20 MG capsule, Take 1 capsule (20 mg total) by mouth daily. **PLEASE CONTACT OFFICE TO SCHEDULE FOLLOW UP, Disp: 90 capsule, Rfl: 0 ?  fluticasone (FLONASE) 50 MCG/ACT nasal spray, USE 2 SPRAYS IN EACH NOSTRIL DAILY AS NEEDED (Patient taking differently: Place 2 sprays into both nostrils daily as needed for allergies or rhinitis. USE 2 SPRAYS IN EACH NOSTRIL DAILY AS NEEDED), Disp: 16 g, Rfl: 1 ?  furosemide (LASIX) 20 MG tablet, TAKE 1 TABLET (20 MG TOTAL) BY MOUTH DAILY WITH BREAKFAST AS NEEDED FOR EDEMA, Disp: 90 tablet, Rfl: 1 ?  LINZESS 145 MCG CAPS capsule, TAKE 1 CAPSULE BY MOUTH  DAILY BEFORE BREAKFAST., Disp: 90 capsule, Rfl: 1 ?  metoprolol succinate (TOPROL-XL) 25 MG 24 hr tablet, TAKE 1 TABLET BY MOUTH EVERY DAY, Disp: 90 tablet, Rfl: 1 ?  Multiple Vitamin (MULTIVITAMIN) tablet, Take 1 tablet by mouth daily., Disp: , Rfl:  ?  propranolol (INDERAL) 10 MG tablet, TAKE 1/2 TABLET BY MOUTH AS NEEDED FOR ANXIETY, Disp: 15 tablet, Rfl: 1 ?  sodium chloride 1 g tablet, Take 1 tablet (1 g total) by mouth 2 (two) times daily with a meal., Disp: 60 tablet, Rfl: 2 ?  Syringe/Needle, Disp, (SYRINGE 3CC/25GX1") 25G X 1" 3 ML MISC, Use for b12 injections, Disp: 50 each, Rfl: 0 ?  tiZANidine (ZANAFLEX) 4 MG tablet, Take 1 tablet (4 mg total) by mouth at bedtime., Disp: 30 tablet, Rfl: 0 ?  zolpidem (AMBIEN) 10 MG tablet, Take 1 tablet (10 mg total) by mouth at bedtime as needed. for sleep, Disp: 30 tablet, Rfl: 5 ?  HYDROcodone-acetaminophen (NORCO) 10-325 MG tablet, Take 1 tablet by mouth every 6 (six) hours as needed., Disp: 120 tablet, Rfl: 0 ?  HYDROcodone-acetaminophen (NORCO) 10-325 MG tablet, Take 1 tablet by mouth every 6 (six) hours as needed., Disp: 120 tablet, Rfl: 0 ?  HYDROcodone-acetaminophen (NORCO) 10-325 MG tablet,  Take 1 tablet by mouth every 6 (six) hours as needed., Disp: 120 tablet, Rfl: 0 ?  rifaximin (XIFAXAN) 550 MG TABS tablet, Take 1 tablet (550 mg total) by mouth 3 (three) times daily. (Patient not taking: Repo

## 2021-06-10 NOTE — Patient Instructions (Signed)
Please be advised that ALL NARCOTICS refill visits  must be in person, NOT VIRTUAL   ?

## 2021-06-12 NOTE — Assessment & Plan Note (Signed)
Mood and affect are normal and congruent .  No changes to buproprion regimen  .  Requests for alprazolam has been denied; use of benzo's is discouraged given her chronic use of opioids and ambien ?

## 2021-06-12 NOTE — Assessment & Plan Note (Signed)
Secondary to SIADH.  Resolved with sodium supplementation 1 gram bid.  She denies fluid retention  ? ?Lab Results  ?Component Value Date  ? NA 136 06/02/2021  ? K 3.9 06/02/2021  ? CL 102 06/02/2021  ? CO2 27 06/02/2021  ? ? ?

## 2021-06-12 NOTE — Assessment & Plan Note (Signed)
She is using pharmcologic and non pharmacologic methods to manage her arthritis. Pain .  she is using a standing desk for work mode and opioids for non operable pain.  She has not had any ER visits for pain management and has not requested any early refills.  Her Refill history was confirmed via [place] Controlled Substance database by me today during her visit and there have been no prescriptions of controlled substances filled from any providers other than me. Refilling for 3 months  ?

## 2021-06-12 NOTE — Assessment & Plan Note (Signed)
Managed with wellbutrin .  Her affect and mood are positive and congruent. No changes today  ?

## 2021-06-13 ENCOUNTER — Ambulatory Visit: Payer: Managed Care, Other (non HMO) | Admitting: Dermatology

## 2021-06-15 ENCOUNTER — Other Ambulatory Visit: Payer: Self-pay | Admitting: Internal Medicine

## 2021-07-11 ENCOUNTER — Encounter: Payer: Self-pay | Admitting: Internal Medicine

## 2021-07-11 ENCOUNTER — Other Ambulatory Visit: Payer: Self-pay | Admitting: Internal Medicine

## 2021-07-13 ENCOUNTER — Other Ambulatory Visit: Payer: Self-pay | Admitting: Internal Medicine

## 2021-07-14 ENCOUNTER — Other Ambulatory Visit: Payer: Self-pay | Admitting: Internal Medicine

## 2021-07-14 MED ORDER — HYDROCODONE-ACETAMINOPHEN 10-325 MG PO TABS: 1.0000 | ORAL_TABLET | Freq: Four times a day (QID) | ORAL | 0 refills | Status: DC | PRN

## 2021-07-19 ENCOUNTER — Other Ambulatory Visit: Payer: Self-pay | Admitting: Internal Medicine

## 2021-07-19 NOTE — Progress Notes (Unsigned)
Adrienne Robinson [place] [address] [place] Phone: [phone] Subjective:   IVilma Meckel, am serving as a scribe for Dr. Hulan Saas.  I'm seeing this patient by the request  of:  [name], MD  CC: Thumb pain, back pain  [id number]  06/08/2021 I believe the patient did have subluxation noted.  Discussed with patient icing regimen and home exercises, which activities to do which ones to avoid, patient will increase activity slowly.  Wear the brace on a regular basis.  Given an injection today as well secondary to the trace effusion.  We will follow-up again in 6 to 8 weeks  Chronic problem with exacerbation.  Discussed icing regimen and home exercises, discussed which activities to do.  Patient does respond well to manipulation.  Does have pain medication from primary care provider.  Follow-up with me again in 6 to 8 weeks  Chronic problem with exacerbation again.  Discussed icing regimen and home exercises, discussed bracing and increase activity slowly.  Follow-up again in 6 to 8 weeks  Update 07/21/2021 Adrienne Robinson is a 66 y.o. female coming in with complaint of L knee, L thumb and LB pain. Patient states lower back pain wants to know about another epidural. Wants injection in left thumb.  Patient does have known arthritic changes of the [facility] joint.  Starting to affect daily activities.  Given injection last visit over 8 weeks ago but now having worsening pain again.     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   s/p embolization  Feb 26 2012, [name]   B12 deficiency 01/2016   Candida esophagitis ([facility])    Degenerative joint disease involving multiple joints    Depression    Dizziness    Hypertension    IBS (irritable bowel syndrome)    Pneumonia    approx 6 years ago   Post-COVID chronic cough  02/12/2020   Prepatellar effusion of right knee 06/09/2019   Aspiration done June 13, 2019   Scoliosis    Spinal stenosis of lumbar region    Squamous cell carcinoma of skin 06/24/2018   R lateral calf   Tubular adenoma of colon    Past Surgical History:  Procedure Laterality Date   ABDOMINAL HYSTERECTOMY     APPENDECTOMY     CARDIAC CATHETERIZATION     normal coronaries, no wall motion abnormalities 11/02/09 [facility])   CATARACT EXTRACTION W/PHACO Left 11/24/2020   Procedure: CATARACT EXTRACTION PHACO AND INTRAOCULAR LENS PLACEMENT ([place]) LEFT 6.09 00:46.7;  Surgeon: [name], MD;  Location: [place];  Service: Ophthalmology;  Laterality: Left;  patient wants early   CATARACT EXTRACTION W/PHACO Right 12/08/2020   Procedure: CATARACT EXTRACTION PHACO AND INTRAOCULAR LENS PLACEMENT (IOC) RIGHT 3.92 00:49.0;  Surgeon: [name], MD;  Location: [place];  Service: Ophthalmology;  Laterality: Right;  patient request early   NASAL SINUS SURGERY     OOPHORECTOMY     RADIOLOGY WITH ANESTHESIA  02/26/2012   Procedure: RADIOLOGY WITH ANESTHESIA;  Surgeon: [name], MD;  Location: [place];  Service: Radiology;  Laterality: N/A;   REVERSE SHOULDER ARTHROPLASTY Right 06/27/2013   Procedure: REVERSE RIGHT TOTAL SHOULDER ARTHROPLASTY;  Surgeon: [name], MD;  Location: [place];  Service: Orthopedics;  Laterality: Right;   SHOULDER ARTHROSCOPY WITH ROTATOR CUFF  REPAIR AND SUBACROMIAL DECOMPRESSION  2007   left   TONSILLECTOMY  adnoids   Social History   Socioeconomic History   Marital status: Married    Spouse name: Not on file   Number of children: 0   Years of education: 18   Highest education level: Not on file  Occupational History   Occupation: Nurse executive   Tobacco Use   Smoking status: Never   Smokeless tobacco: Never  Vaping Use   Vaping Use: Never used  Substance and Sexual Activity   Alcohol use: Not Currently     Alcohol/week: 2.0 standard drinks    Types: 2 Glasses of wine per week    Comment: 2-3 week with dinner   Drug use: No   Sexual activity: Not on file  Other Topics Concern   Not on file  Social History Narrative   Lives at home with her husband.   Right-handed.   2-3 diet cokes per day.   Social Determinants of Health   Financial Resource Strain: Not on file  Food Insecurity: Not on file  Transportation Needs: Not on file  Physical Activity: Not on file  Stress: Not on file  Social Connections: Not on file   Allergies  Allergen Reactions   Buspirone Other (See Comments)    Dizziness,  dry mouth    Celebrex [Celecoxib] Swelling   Adhesive [Tape] Rash and Other (See Comments)    Redness, blisters, skin peeling off.   Morphine And Related Rash   Family History  Problem Relation Age of Onset   Colon cancer Father 98   Diabetes Father    Heart disease Father    Rectal cancer Father    Thyroid disease Mother    Colon cancer Paternal Grandfather    Colon cancer Other        pggm   Breast cancer Paternal Grandmother    Stomach cancer Neg Hx      Current Outpatient Medications (Cardiovascular):    amLODipine (NORVASC) 10 MG tablet, TAKE 1 TABLET BY MOUTH EVERY DAY   furosemide (LASIX) 20 MG tablet, TAKE 1 TABLET (20 MG TOTAL) BY MOUTH DAILY WITH BREAKFAST AS NEEDED FOR EDEMA   metoprolol succinate (TOPROL-XL) 25 MG 24 hr tablet, TAKE 1 TABLET BY MOUTH EVERY DAY   propranolol (INDERAL) 10 MG tablet, TAKE 1/2 TABLET BY MOUTH AS NEEDED FOR ANXIETY  Current Outpatient Medications (Respiratory):    fluticasone (FLONASE) 50 MCG/ACT nasal spray, USE 2 SPRAYS IN EACH NOSTRIL DAILY AS NEEDED (Patient taking differently: Place 2 sprays into both nostrils daily as needed for allergies or rhinitis. USE 2 SPRAYS IN EACH NOSTRIL DAILY AS NEEDED)  Current Outpatient Medications (Analgesics):    HYDROcodone-acetaminophen (NORCO) 10-325 MG tablet, Take 1 tablet by mouth every 6 (six)  hours as needed.   HYDROcodone-acetaminophen (NORCO) 10-325 MG tablet, Take 1 tablet by mouth every 6 (six) hours as needed.   HYDROcodone-acetaminophen (NORCO) 10-325 MG tablet, Take 1 tablet by mouth every 6 (six) hours as needed.  Current Outpatient Medications (Hematological):    cyanocobalamin (,VITAMIN B-12,) 1000 MCG/ML injection, INJECT 1 ML (1,000 MCG TOTAL) INTO THE MUSCLE ONCE A WEEK. (Patient taking differently: Inject 1,000 mcg into the muscle every 30 (thirty) days.)  Current Outpatient Medications (Other):    buPROPion (WELLBUTRIN XL) 150 MG 24 hr tablet, TAKE 1 TABLET BY MOUTH EVERY DAY IN THE MORNING   Cholecalciferol (VITAMIN D3) 50 MCG (2000 UT) CAPS, Take 1 capsule by mouth daily.  dicyclomine (BENTYL) 20 MG tablet, TAKE 1 TABLET (20 MG TOTAL) BY MOUTH 3 (THREE) TIMES DAILY BEFORE MEALS.   esomeprazole (NEXIUM) 20 MG capsule, Take 1 capsule (20 mg total) by mouth daily. **PLEASE CONTACT OFFICE TO SCHEDULE FOLLOW UP   LINZESS 145 MCG CAPS capsule, TAKE 1 CAPSULE BY MOUTH DAILY BEFORE BREAKFAST.   Multiple Vitamin (MULTIVITAMIN) tablet, Take 1 tablet by mouth daily.   rifaximin (XIFAXAN) 550 MG TABS tablet, Take 1 tablet (550 mg total) by mouth 3 (three) times daily. (Patient not taking: Reported on 06/10/2021)   sodium chloride 1 g tablet, Take 1 tablet (1 g total) by mouth 2 (two) times daily with a meal.   Syringe/Needle, Disp, (SYRINGE 3CC/25GX1") 25G X 1" 3 ML MISC, Use for b12 injections   tiZANidine (ZANAFLEX) 4 MG tablet, Take 1 tablet (4 mg total) by mouth at bedtime.   zolpidem (AMBIEN) 10 MG tablet, Take 1 tablet (10 mg total) by mouth at bedtime as needed. for sleep   Reviewed prior external information including notes and imaging from  primary care provider As well as notes that were available from care everywhere and other healthcare systems.  Past medical history, social, surgical and family history all reviewed in electronic medical record.  No pertanent  information unless stated regarding to the chief complaint.   Review of Systems:  No headache, visual changes, nausea, vomiting, diarrhea, constipation, dizziness, abdominal pain, skin rash, fevers, chills, night sweats, weight loss, swollen lymph nodes, joint swelling, chest pain, shortness of breath, mood changes. POSITIVE muscle aches, body aches  Objective  Blood pressure 124/74, pulse 64, height '5\' 6"'$  (1.676 m), weight 120 lb (54.4 kg), SpO2 98 %.   General: No apparent distress alert and oriented x3 mood and affect normal, dressed appropriately.  HEENT: Pupils equal, extraocular movements intact  Respiratory: Patient's speak in full sentences and does not appear short of breath  Cardiovascular: No lower extremity edema, non tender, no erythema  Gait normal with good balance and coordination.  MSK: Left hand exam shows severe effusion of the [facility] joint.  Crepitus noted.  Does have some thenar eminence wasting. Patient back exam does have significant scoliosis noted.  Tightness noted in the paraspinal musculature of the back right greater than left.  Limited extension of the neck noted.  Patient does seem to be significantly uncomfortable.  Procedure: Real-time Ultrasound Guided Injection of left [facility] joint Device: GE Logiq Q7 Ultrasound guided injection is preferred based studies that show increased duration, increased effect, greater accuracy, decreased procedural pain, increased response rate, and decreased cost with ultrasound guided versus blind injection.  Verbal informed consent obtained.  Time-out conducted.  Noted no overlying erythema, induration, or other signs of local infection.  Skin prepped in a sterile fashion.  Local anesthesia: Topical Ethyl chloride.  With sterile technique and under real time ultrasound guidance: With a 25-gauge half inch needle injecting 0.5 cc of 0.5% Marcaine and 0.5 cc of Kenalog 40 mg Completed without difficulty  Advised to call if fevers/chills,  erythema, induration, drainage, or persistent bleeding.  Impression: Technically successful ultrasound guided injection.   Impression and Recommendations:    The above documentation has been reviewed and is accurate and complete Lyndal Pulley, DO

## 2021-07-21 ENCOUNTER — Ambulatory Visit (INDEPENDENT_AMBULATORY_CARE_PROVIDER_SITE_OTHER): Payer: Managed Care, Other (non HMO) | Admitting: Family Medicine

## 2021-07-21 ENCOUNTER — Ambulatory Visit: Payer: Self-pay

## 2021-07-21 ENCOUNTER — Ambulatory Visit (INDEPENDENT_AMBULATORY_CARE_PROVIDER_SITE_OTHER): Payer: Managed Care, Other (non HMO)

## 2021-07-21 VITALS — BP 124/74 | HR 64 | Ht 66.0 in | Wt 120.0 lb

## 2021-07-21 DIAGNOSIS — M79645 Pain in left finger(s): Secondary | ICD-10-CM

## 2021-07-21 DIAGNOSIS — M1812 Unilateral primary osteoarthritis of first carpometacarpal joint, left hand: Secondary | ICD-10-CM | POA: Diagnosis not present

## 2021-07-21 DIAGNOSIS — M48061 Spinal stenosis, lumbar region without neurogenic claudication: Secondary | ICD-10-CM | POA: Diagnosis not present

## 2021-07-21 MED ORDER — KETOROLAC TROMETHAMINE 30 MG/ML IJ SOLN
30.0000 mg | Freq: Once | INTRAMUSCULAR | Status: AC
  Administered 2021-07-21: 30 mg via INTRAMUSCULAR

## 2021-07-21 MED ORDER — METHYLPREDNISOLONE ACETATE 40 MG/ML IJ SUSP
40.0000 mg | Freq: Once | INTRAMUSCULAR | Status: AC
  Administered 2021-07-21: 40 mg via INTRAMUSCULAR

## 2021-07-21 NOTE — Assessment & Plan Note (Addendum)
Repeat injection given today and tolerated the procedure well, discussed icing regimen and home exercise, which activities to do which ones to avoid.  I do feel that patient does need referral to discuss the possibility of surgical intervention.  Likely is due to the severity of the arthritic changes may need to consider the possibility of replacement.

## 2021-07-21 NOTE — Patient Instructions (Addendum)
Xrays today [place] [phone] Call to schedule epidural  Will put in referral to hand doc See you again 6 weeks after epidural

## 2021-07-21 NOTE — Assessment & Plan Note (Signed)
Chronic problem with exacerbation.  Discussed icing regimen and home exercises.  Discussed which activities to do which ones to avoid.  I do believe the patient has responded well to the epidural and has been 6 months since we have done 1 we will repeat the order at this point.  Due to the severity of pain will give Toradol and Depo-Medrol as well.

## 2021-07-26 ENCOUNTER — Other Ambulatory Visit: Payer: Self-pay | Admitting: Internal Medicine

## 2021-08-03 ENCOUNTER — Ambulatory Visit
Admission: RE | Admit: 2021-08-03 | Discharge: 2021-08-03 | Disposition: A | Discharge status: Home / Self Care | Payer: Managed Care, Other (non HMO) | Source: Ambulatory Visit | Attending: Family Medicine | Admitting: Family Medicine

## 2021-08-03 DIAGNOSIS — M48061 Spinal stenosis, lumbar region without neurogenic claudication: Secondary | ICD-10-CM

## 2021-08-03 MED ORDER — METHYLPREDNISOLONE ACETATE 40 MG/ML INJ SUSP (RADIOLOG
80.0000 mg | Freq: Once | INTRAMUSCULAR | Status: AC
  Administered 2021-08-03: 80 mg via EPIDURAL

## 2021-08-03 MED ORDER — IOPAMIDOL (ISOVUE-M 200) INJECTION 41%
1.0000 mL | Freq: Once | INTRAMUSCULAR | Status: AC
  Administered 2021-08-03: 1 mL via EPIDURAL

## 2021-08-03 NOTE — Discharge Instructions (Signed)

## 2021-08-08 ENCOUNTER — Encounter: Payer: Self-pay | Admitting: Internal Medicine

## 2021-08-09 ENCOUNTER — Other Ambulatory Visit: Payer: Self-pay | Admitting: Internal Medicine

## 2021-08-09 MED ORDER — HYDROCODONE-ACETAMINOPHEN 10-325 MG PO TABS: 1.0000 | ORAL_TABLET | Freq: Four times a day (QID) | ORAL | 0 refills | Status: DC | PRN

## 2021-08-10 MED ORDER — HYDROCODONE-ACETAMINOPHEN 5-325 MG PO TABS: 2.0000 | ORAL_TABLET | Freq: Four times a day (QID) | ORAL | 0 refills | Status: DC | PRN

## 2021-08-10 NOTE — Addendum Note (Signed)
Addended by: [name] on: 08/10/2021 01:15 PM   Modules accepted: Orders

## 2021-08-15 ENCOUNTER — Encounter: Payer: Self-pay | Admitting: Internal Medicine

## 2021-08-24 ENCOUNTER — Ambulatory Visit
Admission: RE | Admit: 2021-08-24 | Discharge: 2021-08-24 | Disposition: A | Discharge status: Home / Self Care | Payer: Managed Care, Other (non HMO) | Source: Ambulatory Visit | Attending: Internal Medicine | Admitting: Internal Medicine

## 2021-08-24 DIAGNOSIS — Z1231 Encounter for screening mammogram for malignant neoplasm of breast: Secondary | ICD-10-CM

## 2021-09-01 ENCOUNTER — Encounter: Payer: Self-pay | Admitting: Internal Medicine

## 2021-09-02 ENCOUNTER — Other Ambulatory Visit: Payer: Self-pay | Admitting: Internal Medicine

## 2021-09-04 ENCOUNTER — Other Ambulatory Visit: Payer: Self-pay | Admitting: Internal Medicine

## 2021-09-04 DIAGNOSIS — E559 Vitamin D deficiency, unspecified: Secondary | ICD-10-CM

## 2021-09-04 DIAGNOSIS — E781 Pure hyperglyceridemia: Secondary | ICD-10-CM

## 2021-09-04 MED ORDER — HYDROCODONE-ACETAMINOPHEN 5-325 MG PO TABS: 2.0000 | ORAL_TABLET | Freq: Four times a day (QID) | ORAL | 0 refills | Status: DC | PRN

## 2021-09-04 NOTE — Telephone Encounter (Signed)
The hydrocodone refill was sent on Sunday night July 16

## 2021-09-06 ENCOUNTER — Ambulatory Visit: Payer: Managed Care, Other (non HMO) | Admitting: Internal Medicine

## 2021-09-07 ENCOUNTER — Other Ambulatory Visit: Payer: Self-pay | Admitting: Internal Medicine

## 2021-09-08 ENCOUNTER — Ambulatory Visit: Payer: Managed Care, Other (non HMO) | Admitting: Internal Medicine

## 2021-09-08 ENCOUNTER — Encounter: Payer: Self-pay | Admitting: Internal Medicine

## 2021-09-08 VITALS — BP 118/68 | HR 64 | Temp 97.9°F | Ht 66.0 in | Wt 118.4 lb

## 2021-09-08 DIAGNOSIS — D649 Anemia, unspecified: Secondary | ICD-10-CM

## 2021-09-08 DIAGNOSIS — Q282 Arteriovenous malformation of cerebral vessels: Secondary | ICD-10-CM

## 2021-09-08 DIAGNOSIS — F321 Major depressive disorder, single episode, moderate: Secondary | ICD-10-CM | POA: Diagnosis not present

## 2021-09-08 DIAGNOSIS — I10 Essential (primary) hypertension: Secondary | ICD-10-CM

## 2021-09-08 DIAGNOSIS — L565 Disseminated superficial actinic porokeratosis (DSAP): Secondary | ICD-10-CM

## 2021-09-08 DIAGNOSIS — M48061 Spinal stenosis, lumbar region without neurogenic claudication: Secondary | ICD-10-CM

## 2021-09-08 LAB — B12 AND FOLATE PANEL
Folate: >24.2 ng/mL (ref >5.9)
Vitamin B-12: 672 pg/mL (ref 211–911)

## 2021-09-08 LAB — IBC + FERRITIN
Ferritin: 101.2 ng/mL (ref 10.0–291.0)
Iron: 105 ug/dL (ref 42–145)
Saturation Ratios: 25.3 % (ref 20.0–50.0)
TIBC: 415.8 ug/dL (ref 250.0–450.0)
Transferrin: 297 mg/dL (ref 212.0–360.0)

## 2021-09-08 NOTE — Patient Instructions (Addendum)
Please call Dr Vena Rua office to get you colonoscopy set up  I have made the referral to Dr Arlean Hopping office

## 2021-09-08 NOTE — Assessment & Plan Note (Addendum)
Affecting limbs initially,  Now on neck and side of face, Managed by Brendolyn Patty with compounded cholesterol /lovastatin 2%/ 2%  cream  For the past year, which is not working per patient. $75 per tub , lasts 2 months .

## 2021-09-08 NOTE — Progress Notes (Signed)
Subjective:  Patient ID: Adrienne Robinson, female    DOB: [birth year]  Age: 66 y.o. MRN: [id number]  CC: The primary encounter diagnosis was Anemia, unspecified type. Diagnoses of Dermatitis, disseminated superficial actinic porokeratosis (DSAP), AVM (arteriovenous malformation) brain, Major depressive disorder, single episode, moderate ([place]), Primary hypertension, and Spinal stenosis of lumbar region without neurogenic claudication were also pertinent to this visit.   HPI Adrienne Robinson presents for follow up on chronic MSK pain managed with hydrocodone 10 mg every 6 hours  Chief Complaint  Patient presents with   Follow-up    1) chronic pain.  Recently had  a medication supply issue with [facility]  not being able to supply the meds.  Had a lumbar ESI 3 weeks ago,  no change in pain.  Planning to return for 2nd.  Has ppt with Sori at [facility] for thumb DJD left hand , severe  . Appt is in August. . Has had several steroid injections in thumb  by Charlann Boxer .  Has tried salon pas with 4% lidocaine,  no help  does get some relief with voltaren gel.  Sitting irritates it .   Has to sit to change the dialysis tubes for [facility] Has a housekeeper but still doing a lot since husband is disabled.  Husband on waiting list for kidney transplant   2) had cataracts removed by [place].  8 months ago.  Vision is greatly improved.  2) HTN  3) HM:  she is dur for colonoscopy and has not called dr yrtle.  Letter send out June 26  4) mammogram done   Outpatient Medications Prior to Visit  Medication Sig Dispense Refill   amLODipine (NORVASC) 10 MG tablet TAKE 1 TABLET BY MOUTH EVERY DAY 90 tablet 3   buPROPion (WELLBUTRIN XL) 150 MG 24 hr tablet TAKE 1 TABLET BY MOUTH EVERY DAY IN THE MORNING 90 tablet 1   cyanocobalamin (,VITAMIN B-12,) 1000 MCG/ML injection INJECT 1 ML (1,000 MCG TOTAL) INTO THE MUSCLE ONCE A WEEK. (Patient taking differently: Inject 1,000 mcg into the muscle every 30 (thirty) days.) 12 mL  6   dicyclomine (BENTYL) 20 MG tablet TAKE 1 TABLET (20 MG TOTAL) BY MOUTH 3 (THREE) TIMES DAILY BEFORE MEALS. 270 tablet 0   esomeprazole (NEXIUM) 20 MG capsule TAKE 1 CAPSULE (20 MG TOTAL) BY MOUTH DAILY. **PLEASE CONTACT OFFICE TO SCHEDULE FOLLOW UP 90 capsule 0   fluticasone (FLONASE) 50 MCG/ACT nasal spray USE 2 SPRAYS IN EACH NOSTRIL DAILY AS NEEDED (Patient taking differently: Place 2 sprays into both nostrils daily as needed for allergies or rhinitis. USE 2 SPRAYS IN EACH NOSTRIL DAILY AS NEEDED) 16 g 1   furosemide (LASIX) 20 MG tablet TAKE 1 TABLET (20 MG TOTAL) BY MOUTH DAILY WITH BREAKFAST AS NEEDED FOR EDEMA 90 tablet 1   HYDROcodone-acetaminophen (NORCO) 10-325 MG tablet Take 1 tablet by mouth every 6 (six) hours as needed. 120 tablet 0   LINZESS 145 MCG CAPS capsule TAKE 1 CAPSULE BY MOUTH DAILY BEFORE BREAKFAST. 90 capsule 1   metoprolol succinate (TOPROL-XL) 25 MG 24 hr tablet TAKE 1 TABLET BY MOUTH EVERY DAY 90 tablet 1   Multiple Vitamin (MULTIVITAMIN) tablet Take 1 tablet by mouth daily.     propranolol (INDERAL) 10 MG tablet TAKE 1/2 TABLET BY MOUTH AS NEEDED FOR ANXIETY 15 tablet 1   rifaximin (XIFAXAN) 550 MG TABS tablet Take 1 tablet (550 mg total) by mouth 3 (three) times daily. 42 tablet 0  sodium chloride 1 g tablet Take 1 tablet (1 g total) by mouth 2 (two) times daily with a meal. 60 tablet 2   Syringe/Needle, Disp, (SYRINGE 3CC/25GX1") 25G X 1" 3 ML MISC Use for b12 injections 50 each 0   zolpidem (AMBIEN) 10 MG tablet Take 1 tablet (10 mg total) by mouth at bedtime as needed. for sleep 30 tablet 5   Cholecalciferol (VITAMIN D3) 50 MCG (2000 UT) CAPS Take 1 capsule by mouth daily. (Patient not taking: Reported on 09/08/2021)     HYDROcodone-acetaminophen (NORCO) 10-325 MG tablet Take 1 tablet by mouth every 6 (six) hours as needed. (Patient not taking: Reported on 09/08/2021) 120 tablet 0   HYDROcodone-acetaminophen (NORCO) 10-325 MG tablet Take 1 tablet by mouth every 6  (six) hours as needed. (Patient not taking: Reported on 09/08/2021) 120 tablet 0   HYDROcodone-acetaminophen (NORCO/VICODIN) 5-325 MG tablet Take 2 tablets by mouth every 6 (six) hours as needed for moderate pain. (Patient not taking: Reported on 09/08/2021) 240 tablet 0   tiZANidine (ZANAFLEX) 4 MG tablet Take 1 tablet (4 mg total) by mouth at bedtime. (Patient not taking: Reported on 09/08/2021) 30 tablet 0   No facility-administered medications prior to visit.    Review of Systems;  Patient denies headache, fevers, malaise, unintentional weight loss, skin rash, eye pain, sinus congestion and sinus pain, sore throat, dysphagia,  hemoptysis , cough, dyspnea, wheezing, chest pain, palpitations, orthopnea, edema, abdominal pain, nausea, melena, diarrhea, constipation, flank pain, dysuria, hematuria, urinary  Frequency, nocturia, numbness, tingling, seizures,  Focal weakness, Loss of consciousness,  Tremor, insomnia, depression, anxiety, and suicidal ideation.      Objective:  BP 118/68 (BP Location: Left Arm, Patient Position: Sitting, Cuff Size: Small)   Pulse 64   Temp 97.9 F (36.6 C) (Oral)   Ht '5\' 6"'$  (1.676 m)   Wt 118 lb 6.4 oz (53.7 kg)   SpO2 98%   BMI 19.11 kg/m   BP Readings from Last 3 Encounters:  09/08/21 118/68  08/03/21 123/63  07/21/21 124/74    Wt Readings from Last 3 Encounters:  09/08/21 118 lb 6.4 oz (53.7 kg)  07/21/21 120 lb (54.4 kg)  06/10/21 116 lb (52.6 kg)    General appearance: alert, cooperative and appears stated age Ears: normal TM's and external ear canals both ears Throat: lips, mucosa, and tongue normal; teeth and gums normal Neck: no adenopathy, no carotid bruit, supple, symmetrical, trachea midline and thyroid not enlarged, symmetric, no tenderness/mass/nodules Back: symmetric, no curvature. ROM normal. No CVA tenderness. Lungs: clear to auscultation bilaterally Heart: regular rate and rhythm, S1, S2 normal, no murmur, click, rub or  gallop Abdomen: soft, non-tender; bowel sounds normal; no masses,  no organomegaly Pulses: 2+ and symmetric Skin: Skin color, texture, turgor normal. No rashes or lesions Lymph nodes: Cervical, supraclavicular, and axillary nodes normal.  Lab Results  Component Value Date   HGBA1C 5.7 08/04/2011    Lab Results  Component Value Date   CREATININE 0.97 06/02/2021   CREATININE 1.03 10/29/2020   CREATININE 0.85 09/14/2020    Lab Results  Component Value Date   WBC 4.8 06/02/2021   HGB 10.3 (L) 06/02/2021   HCT 30.9 (L) 06/02/2021   PLT 274.0 06/02/2021   GLUCOSE 88 06/02/2021   CHOL 171 06/02/2021   TRIG 120.0 06/02/2021   HDL 53.60 06/02/2021   LDLDIRECT 81.0 01/22/2020   LDLCALC 93 06/02/2021   ALT 18 06/02/2021   AST 21 06/02/2021   NA 136  06/02/2021   K 3.9 06/02/2021   CL 102 06/02/2021   CREATININE 0.97 06/02/2021   BUN 15 06/02/2021   CO2 27 06/02/2021   TSH 0.706 07/13/2020   INR 1.0 07/12/2020   HGBA1C 5.7 08/04/2011   MICROALBUR <0.7 06/07/2021    MM 3D SCREEN BREAST BILATERAL  Result Date: 08/25/2021 CLINICAL DATA:  Screening. EXAM: DIGITAL SCREENING BILATERAL MAMMOGRAM WITH TOMOSYNTHESIS AND CAD TECHNIQUE: Bilateral screening digital craniocaudal and mediolateral oblique mammograms were obtained. Bilateral screening digital breast tomosynthesis was performed. The images were evaluated with computer-aided detection. COMPARISON:  Previous exam(s). ACR Breast Density Category c: The breast tissue is heterogeneously dense, which may obscure small masses. FINDINGS: There are no findings suspicious for malignancy. IMPRESSION: No mammographic evidence of malignancy. A result letter of this screening mammogram will be mailed directly to the patient. RECOMMENDATION: Screening mammogram in one year. (Code:SM-B-01Y) BI-RADS CATEGORY  1: Negative. Electronically Signed   By: Ileana Roup M.D.   On: 08/25/2021 08:55    Assessment & Plan:   Problem List Items Addressed This  Visit     Anemia - Primary   Relevant Orders   IBC + Ferritin (Completed)   B12 and Folate Panel (Completed)   Fecal occult blood, imunochemical   Major depressive disorder, single episode, moderate (HCC)    Tolerating low dose of welbutrin.  Reviewed her responsibilities asprimary  carigver for husband who ha s EKSD managed with peritoneal dialysis ,  While working full time. Her mood and affect are cherrful yet serious  She is handling the sress well.       Hypertension    Well controlled on amlodipine 10 mg daily  by home readings . Marland Kitchen Renal function and electrolytes have normalized,  no changes today.  Lab Results  Component Value Date   CREATININE 0.97 06/02/2021   Lab Results  Component Value Date   NA 136 06/02/2021   K 3.9 06/02/2021   CL 102 06/02/2021   CO2 27 06/02/2021         Dermatitis, disseminated superficial actinic porokeratosis (DSAP)    Affecting limbs initially,  Now on neck and side of face, Managed by Brendolyn Patty with compounded cholesterol /lovastatin 2%/ 2%  cream  For the past year, which is not working per patient. $75 per tub , lasts 2 months .      Spinal stenosis of lumbar region    Managed with a standing desk for work mode and opioids for non operable pain.  She has not had any ER visits for pain management and has not requested any early refills.  Her Refill history was confirmed via [place] Controlled Substance database by me today during her visit and there have been no prescriptions of controlled substances filled from any providers other than me. Refilling for 3 months       AVM (arteriovenous malformation) brain    S/p embolization jan 2014.  Referral to Dr Estanislado Pandy needed for follow up       Relevant Orders   Ambulatory referral to Interventional Radiology    I spent a total of 30 minutes with this patient in a face to face visit on the date of this encounter reviewing the last office visit with me ,  patient's diet and eating habits,  home blood pressure readings ,  most recent imaging study ,   and post visit ordering of testing and therapeutic  Follow-up: Return in about 3 months (around 12/09/2021).   [name],  MD

## 2021-09-08 NOTE — Assessment & Plan Note (Addendum)
S/p embolization jan 2014.  Referral to Dr Estanislado Pandy needed for follow up

## 2021-09-10 NOTE — Assessment & Plan Note (Signed)
Well controlled on amlodipine 10 mg daily  by home readings . Marland Kitchen Renal function and electrolytes have normalized,  no changes today.  Lab Results  Component Value Date   CREATININE 0.97 06/02/2021   Lab Results  Component Value Date   NA 136 06/02/2021   K 3.9 06/02/2021   CL 102 06/02/2021   CO2 27 06/02/2021

## 2021-09-10 NOTE — Assessment & Plan Note (Signed)
Managed with a standing desk for work mode and opioids for non operable pain.  She has not had any ER visits for pain management and has not requested any early refills.  Her Refill history was confirmed via [place] Controlled Substance database by me today during her visit and there have been no prescriptions of controlled substances filled from any providers other than me. Refilling for 3 months

## 2021-09-10 NOTE — Assessment & Plan Note (Addendum)
Tolerating low dose of welbutrin.  Reviewed her responsibilities asprimary  carigver for husband who ha s EKSD managed with peritoneal dialysis ,  While working full time. Her mood and affect are cherrful yet serious  She is handling the sress well.

## 2021-09-11 ENCOUNTER — Other Ambulatory Visit: Payer: Self-pay | Admitting: Internal Medicine

## 2021-09-12 ENCOUNTER — Encounter: Payer: Self-pay | Admitting: Internal Medicine

## 2021-09-12 ENCOUNTER — Other Ambulatory Visit: Payer: Self-pay | Admitting: Internal Medicine

## 2021-09-12 MED ORDER — ZOLPIDEM TARTRATE 10 MG PO TABS: 10.0000 mg | ORAL_TABLET | Freq: Every evening | ORAL | 5 refills | Status: DC | PRN

## 2021-09-12 MED ORDER — HYDROCODONE-ACETAMINOPHEN 10-325 MG PO TABS
1.0000 | ORAL_TABLET | Freq: Four times a day (QID) | ORAL | 0 refills | Status: DC | PRN
Start: 2021-09-12 — End: 2021-11-29

## 2021-09-12 MED ORDER — HYDROCODONE-ACETAMINOPHEN 10-325 MG PO TABS: 1.0000 | ORAL_TABLET | Freq: Four times a day (QID) | ORAL | 0 refills | Status: DC | PRN

## 2021-09-13 LAB — FECAL OCCULT BLOOD, IMMUNOCHEMICAL: Fecal Occult Bld: NEGATIVE

## 2021-09-15 ENCOUNTER — Telehealth: Payer: Self-pay | Admitting: Family Medicine

## 2021-09-15 ENCOUNTER — Other Ambulatory Visit: Payer: Self-pay

## 2021-09-15 ENCOUNTER — Encounter: Payer: Self-pay | Admitting: Internal Medicine

## 2021-09-15 DIAGNOSIS — M5416 Radiculopathy, lumbar region: Secondary | ICD-10-CM

## 2021-09-15 NOTE — Telephone Encounter (Signed)
Epidural ordered and patient notified.  

## 2021-09-15 NOTE — Telephone Encounter (Signed)
Patient left message asking for an epidural order to be sent to [place]?  Please advise.

## 2021-09-20 ENCOUNTER — Ambulatory Visit
Admission: RE | Admit: 2021-09-20 | Discharge: 2021-09-20 | Discharge status: Home / Self Care | Payer: Managed Care, Other (non HMO) | Source: Ambulatory Visit | Attending: Family Medicine | Admitting: Family Medicine

## 2021-09-20 DIAGNOSIS — M5416 Radiculopathy, lumbar region: Secondary | ICD-10-CM

## 2021-09-20 MED ORDER — METHYLPREDNISOLONE ACETATE 40 MG/ML INJ SUSP (RADIOLOG
80.0000 mg | Freq: Once | INTRAMUSCULAR | Status: AC
  Administered 2021-09-20: 80 mg via EPIDURAL

## 2021-09-20 MED ORDER — IOPAMIDOL (ISOVUE-M 200) INJECTION 41%
1.0000 mL | Freq: Once | INTRAMUSCULAR | Status: AC
  Administered 2021-09-20: 1 mL via EPIDURAL

## 2021-09-20 NOTE — Discharge Instructions (Signed)

## 2021-09-21 ENCOUNTER — Encounter: Payer: Self-pay | Admitting: Internal Medicine

## 2021-09-25 ENCOUNTER — Other Ambulatory Visit: Payer: Self-pay | Admitting: Internal Medicine

## 2021-10-12 ENCOUNTER — Other Ambulatory Visit: Payer: Self-pay | Admitting: Internal Medicine

## 2021-10-13 ENCOUNTER — Telehealth: Payer: Self-pay | Admitting: Internal Medicine

## 2021-10-13 ENCOUNTER — Other Ambulatory Visit: Payer: Self-pay

## 2021-10-13 MED ORDER — ESOMEPRAZOLE MAGNESIUM 20 MG PO CPDR: 20.0000 mg | DELAYED_RELEASE_CAPSULE | Freq: Every day | ORAL | 0 refills | Status: DC

## 2021-10-13 NOTE — Telephone Encounter (Signed)
Pt has previsit in Sept and procedure in October. Prescription refilled until procedure date.

## 2021-10-13 NOTE — Telephone Encounter (Signed)
Patient needs esomeprazole refill, thank you!

## 2021-10-21 ENCOUNTER — Other Ambulatory Visit: Payer: Self-pay | Admitting: Internal Medicine

## 2021-10-21 ENCOUNTER — Telehealth: Payer: Self-pay

## 2021-10-21 NOTE — Telephone Encounter (Signed)
Spoke with pt scheduled for PV this am. She reports having called in earlier to say due to a family emergency she would need to reschedule PV.  She will call back later today when convenient (aware that if no call back today, will have to r/s colonoscopy as well).

## 2021-10-31 ENCOUNTER — Other Ambulatory Visit: Payer: Self-pay

## 2021-10-31 ENCOUNTER — Ambulatory Visit (AMBULATORY_SURGERY_CENTER): Payer: Self-pay

## 2021-10-31 VITALS — Ht 66.0 in | Wt 115.0 lb

## 2021-10-31 DIAGNOSIS — Z86010 Personal history of colonic polyps: Secondary | ICD-10-CM

## 2021-10-31 MED ORDER — NA SULFATE-K SULFATE-MG SULF 17.5-3.13-1.6 GM/177ML PO SOLN: 1.0000 | Freq: Once | ORAL | 0 refills | Status: AC

## 2021-10-31 NOTE — Progress Notes (Signed)
Denies allergies to eggs or soy products. Denies complication of anesthesia or sedation. Denies use of weight loss medication. Denies use of O2.   Emmi instructions given for colonoscopy.   Pre-Visit was conducted on the phone per patients request. Patient is requesting Versed prior to going into the procedure room.. I told her I didn't know if that would be possible. Patient states that she will talk to the CRNA.

## 2021-11-01 ENCOUNTER — Telehealth: Payer: Self-pay

## 2021-11-01 NOTE — Telephone Encounter (Signed)
Patient states she is concerned about low sodium and would like to have it checked before her virtual appointment with Dr. Deborra Medina on 11/09/2021.  Patient states she is coming in for a lab appointment on 11/04/2021, if we can add this to the orders.

## 2021-11-04 ENCOUNTER — Other Ambulatory Visit (INDEPENDENT_AMBULATORY_CARE_PROVIDER_SITE_OTHER): Payer: Managed Care, Other (non HMO)

## 2021-11-04 DIAGNOSIS — E559 Vitamin D deficiency, unspecified: Secondary | ICD-10-CM

## 2021-11-04 DIAGNOSIS — E781 Pure hyperglyceridemia: Secondary | ICD-10-CM

## 2021-11-04 LAB — COMPREHENSIVE METABOLIC PANEL
ALT: 14 U/L (ref 0–35)
AST: 19 U/L (ref 0–37)
Albumin: 4 g/dL (ref 3.5–5.2)
Alkaline Phosphatase: 70 U/L (ref 39–117)
BUN: 15 mg/dL (ref 6–23)
CO2: 27 mEq/L (ref 19–32)
Calcium: 9.3 mg/dL (ref 8.4–10.5)
Chloride: 103 mEq/L (ref 96–112)
Creatinine, Ser: 0.97 mg/dL (ref 0.40–1.20)
GFR: 61.18 mL/min (ref >60.00)
Glucose, Bld: 87 mg/dL (ref 70–99)
Potassium: 3.6 mEq/L (ref 3.5–5.1)
Sodium: 140 mEq/L (ref 135–145)
Total Bilirubin: 0.4 mg/dL (ref 0.2–1.2)
Total Protein: 6.4 g/dL (ref 6.0–8.3)

## 2021-11-04 LAB — VITAMIN D 25 HYDROXY (VIT D DEFICIENCY, FRACTURES): VITD: 38.01 ng/mL (ref 30.00–100.00)

## 2021-11-05 LAB — LIPID PANEL W/REFLEX DIRECT LDL
Cholesterol: 206 mg/dL — ABNORMAL HIGH (ref <200)
HDL: 57 mg/dL (ref >=50)
LDL Cholesterol (Calc): 120 mg/dL (calc) — ABNORMAL HIGH
Non-HDL Cholesterol (Calc): 149 mg/dL (calc) — ABNORMAL HIGH (ref <130)
Total CHOL/HDL Ratio: 3.6 (calc) (ref <5.0)
Triglycerides: 170 mg/dL — ABNORMAL HIGH (ref <150)

## 2021-11-09 ENCOUNTER — Encounter: Payer: Self-pay | Admitting: Internal Medicine

## 2021-11-09 ENCOUNTER — Telehealth (INDEPENDENT_AMBULATORY_CARE_PROVIDER_SITE_OTHER): Payer: Managed Care, Other (non HMO) | Admitting: Internal Medicine

## 2021-11-09 VITALS — BP 120/80 | Ht 66.0 in | Wt 115.0 lb

## 2021-11-09 DIAGNOSIS — D649 Anemia, unspecified: Secondary | ICD-10-CM | POA: Diagnosis not present

## 2021-11-09 DIAGNOSIS — R5383 Other fatigue: Secondary | ICD-10-CM | POA: Diagnosis not present

## 2021-11-09 DIAGNOSIS — F5104 Psychophysiologic insomnia: Secondary | ICD-10-CM

## 2021-11-09 MED ORDER — ZOLPIDEM TARTRATE ER 12.5 MG PO TBCR: 12.5000 mg | EXTENDED_RELEASE_TABLET | Freq: Every evening | ORAL | 0 refills | Status: DC | PRN

## 2021-11-09 NOTE — Progress Notes (Unsigned)
[place] Phone: [phone] Subjective:   Adrienne Robinson, am serving as a scribe for Dr. Hulan Saas.  I'm seeing this patient by the request  of:  [name], MD  CC: back pain, thumb pain   [id number]  07/21/2021 Chronic problem with exacerbation.  Discussed icing regimen and home exercises.  Discussed which activities to do which ones to avoid.  I do believe the patient has responded well to the epidural and has been 6 months since we have done 1 we will repeat the order at this point.  Due to the severity of pain will give Toradol and Depo-Medrol as well.  Repeat injection given today and tolerated the procedure well, discussed icing regimen and home exercise, which activities to do which ones to avoid.  I do feel that patient does need referral to discuss the possibility of surgical intervention.  Likely is due to the severity of the arthritic changes may need to consider the possibility of replacement.  Updated 11/10/2021 Adrienne Robinson is a 66 y.o. female coming in with complaint of back pain and left thumb pain. Patient states that she would like to get injection L CMC joint today. Patient has been helping husband and using hands also.   Pain in back is also worse. Seems to be radiating lower than it has previously. Patient would like another epidural as she feels like her legs are weak. Has not fallen.        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   Prepatellar effusion of right knee 06/09/2019   Aspiration done June 13, 2019   Scoliosis    Spinal stenosis of lumbar region    Squamous cell carcinoma of skin 06/24/2018   R lateral calf   Tubular adenoma of colon    Past Surgical History:  Procedure Laterality Date   ABDOMINAL HYSTERECTOMY     APPENDECTOMY     CARDIAC CATHETERIZATION     normal coronaries, Robinson wall motion abnormalities 11/02/09 [facility])   CATARACT EXTRACTION W/PHACO Left 11/24/2020   Procedure: CATARACT EXTRACTION PHACO AND INTRAOCULAR LENS PLACEMENT ([place]) LEFT 6.09 00:46.7;  Surgeon: [name], MD;  Location: [facility];  Service: Ophthalmology;  Laterality: Left;  patient wants early   CATARACT EXTRACTION W/PHACO Right 12/08/2020   Procedure: CATARACT EXTRACTION PHACO AND INTRAOCULAR LENS PLACEMENT ([facility]) RIGHT 3.92 00:49.0;  Surgeon: [name], MD;  Location: [facility];  Service: Ophthalmology;  Laterality: Right;  patient request early   NASAL SINUS SURGERY     OOPHORECTOMY     RADIOLOGY WITH ANESTHESIA  02/26/2012   Procedure: RADIOLOGY WITH ANESTHESIA;  Surgeon: [name], MD;  Location: [facility];  Service: Radiology;  Laterality: N/A;   REVERSE SHOULDER ARTHROPLASTY Right 06/27/2013   Procedure: REVERSE RIGHT TOTAL SHOULDER ARTHROPLASTY;  Surgeon: [name], MD;  Location: [facility];  Service: Orthopedics;  Laterality: Right;   SHOULDER ARTHROSCOPY WITH ROTATOR CUFF REPAIR AND SUBACROMIAL DECOMPRESSION  2007   left   TONSILLECTOMY  adnoids   Social History   Socioeconomic History   Marital status: Married    Spouse name: Not on file   Number of children: 0   Years of education: 18   Highest education level: Not on file  Occupational History   Occupation: Nurse executive   Tobacco Use   Smoking status: Never   Smokeless tobacco: Never  Vaping Use   Vaping Use: Never used  Substance and Sexual Activity   Alcohol use:  Yes    Alcohol/week: 2.0 standard drinks of alcohol    Types: 2 Glasses of wine per week    Comment: 2-3 week with dinner   Drug use: Robinson   Sexual activity: Not on file  Other Topics Concern   Not on file  Social History Narrative   Lives at home with her husband.   Right-handed.   2-3 diet cokes per day.   Social Determinants of Health   Financial Resource Strain: Not on file  Food Insecurity: Not on file  Transportation Needs: Not on file  Physical Activity: Not on file  Stress: Not on file  Social Connections: Not on file   Allergies  Allergen Reactions   Buspirone Other (See Comments)    Dizziness,  dry mouth    Celebrex [Celecoxib] Swelling   Adhesive [Tape] Rash and Other (See Comments)    Redness, blisters, skin peeling off.   Morphine And Related Rash   Family History  Problem Relation Age of Onset   Thyroid disease Mother    Colon cancer Father 67   Diabetes Father    Heart disease Father    Rectal cancer Father    Breast cancer Paternal Grandmother    Colon cancer Paternal Grandfather    Colon cancer Other        pggm   Stomach cancer Neg Hx    Esophageal cancer Neg Hx      Current Outpatient Medications (Cardiovascular):    amLODipine (NORVASC) 10 MG tablet, TAKE 1 TABLET BY MOUTH EVERY DAY   furosemide (LASIX) 20 MG tablet, TAKE 1 TABLET (20 MG TOTAL) BY MOUTH DAILY WITH BREAKFAST AS NEEDED FOR EDEMA   metoprolol succinate (TOPROL-XL) 25 MG 24 hr tablet, TAKE 1 TABLET BY MOUTH EVERY DAY   propranolol (INDERAL) 10 MG tablet, TAKE 1/2 TABLET BY MOUTH AS NEEDED FOR ANXIETY  Current Outpatient Medications (Respiratory):    fluticasone (FLONASE) 50 MCG/ACT nasal spray, USE 2 SPRAYS IN EACH NOSTRIL DAILY AS NEEDED (Patient taking differently: Place 2 sprays into both nostrils daily as needed for allergies or rhinitis. USE 2 SPRAYS IN EACH NOSTRIL DAILY AS NEEDED)  Current Outpatient Medications (Analgesics):    HYDROcodone-acetaminophen (NORCO) 10-325 MG  tablet, Take 1 tablet by mouth every 6 (six) hours as needed.   HYDROcodone-acetaminophen (NORCO) 10-325 MG tablet, Take 1 tablet by mouth every 6 (six) hours as needed.  Current Outpatient Medications (Hematological):    cyanocobalamin (,VITAMIN B-12,) 1000 MCG/ML injection, INJECT 1 ML (1,000 MCG TOTAL) INTO THE MUSCLE ONCE A WEEK. (Patient taking differently: Inject 1,000 mcg into the muscle every 30 (thirty) days.)  Current Outpatient Medications (Other):    buPROPion (WELLBUTRIN XL) 150 MG 24 hr tablet, TAKE 1 TABLET BY MOUTH EVERY DAY IN THE MORNING   dicyclomine (BENTYL) 20 MG tablet, TAKE 1 TABLET (20 MG  TOTAL) BY MOUTH 3 (THREE) TIMES DAILY BEFORE MEALS.   esomeprazole (NEXIUM) 20 MG capsule, Take 1 capsule (20 mg total) by mouth daily. **PLEASE CONTACT OFFICE TO SCHEDULE FOLLOW UP   Multiple Vitamin (MULTIVITAMIN) tablet, Take 1 tablet by mouth daily.   Na Sulfate-K Sulfate-Mg Sulf 17.5-3.13-1.6 GM/177ML SOLN, Take by mouth as directed.   sodium chloride 1 g tablet, Take 1 tablet (1 g total) by mouth 2 (two) times daily with a meal.   Syringe/Needle, Disp, (SYRINGE 3CC/25GX1") 25G X 1" 3 ML MISC, Use for b12 injections   zolpidem (AMBIEN CR) 12.5 MG CR tablet, Take 1 tablet (12.5 mg total) by mouth at bedtime as needed for sleep.   LINZESS 145 MCG CAPS capsule, TAKE 1 CAPSULE BY MOUTH EVERY DAY BEFORE BREAKFAST   Reviewed prior external information including notes and imaging from  primary care provider As well as notes that were available from care everywhere and other healthcare systems.  Past medical history, social, surgical and family history all reviewed in electronic medical record.  Robinson pertanent information unless stated regarding to the chief complaint.   Review of Systems:  Robinson headache, visual changes, nausea, vomiting, diarrhea, constipation, dizziness, abdominal pain, skin rash, fevers, chills, night sweats, weight loss, swollen lymph nodes, body aches, joint swelling,  chest pain, shortness of breath, mood changes. POSITIVE muscle aches  Objective  Blood pressure 98/68, pulse 68, height '5\' 6"'$  (1.676 m), weight 115 lb (52.2 kg), SpO2 97 %.   General: Robinson apparent distress alert and oriented x3 mood and affect normal, dressed appropriately.  HEENT: Pupils equal, extraocular movements intact  Respiratory: Patient's speak in full sentences and does not appear short of breath  Cardiovascular: Robinson lower extremity edema, non tender, Robinson erythema  Patient's low back does have actually significant tightness of the musculature noted.  Multiple trigger points in the lumbar region on the right greater than left.  Patient does have tightness with FABER test on the right greater than left.  Severe arthritic changes noted of the [facility] on the left side.  Positive grind test noted.  Procedure: Real-time Ultrasound Guided Injection of left CMC joint Device: GE Logiq Q7 Ultrasound guided injection is preferred based studies that show increased duration, increased effect, greater accuracy, decreased procedural pain, increased response rate, and decreased cost with ultrasound guided versus blind injection.  Verbal informed consent obtained.  Time-out conducted.  Noted Robinson overlying erythema, induration, or other signs of local infection.  Skin prepped in a sterile fashion.  Local anesthesia: Topical Ethyl chloride.  With sterile technique and under real time ultrasound guidance: With a 25-gauge half inch needle injecting 0.5 cc of 0.5% Marcaine and 0.5 cc of Kenalog 40 mg/mL. Completed without difficulty  Pain immediately resolved suggesting accurate placement of the medication.  Advised to call if fevers/chills, erythema, induration, drainage, or persistent bleeding.  Impression: Technically successful ultrasound guided injection.  After verbal consent patient was prepped with alcohol swabs and with an 25-gauge 1 inch needle injected again with a total of 3 cc 0.5% Marcaine and 1  cc of Kenalog 40 mg/mL into the lumbar musculature.  Robinson blood loss.  Band-Aid placed.  Postinjection instructions given.    Impression and Recommendations:    The above documentation has been reviewed and is accurate and complete Lyndal Pulley, DO

## 2021-11-09 NOTE — Progress Notes (Signed)
Virtual Visit via [facility]   Note    This format is felt to be most appropriate for this patient at this time.  All issues noted in this document were discussed and addressed.  No physical exam was performed (except for noted visual exam findings with Video Visits).   I connected with Adrienne Robinson  on 11/09/21 at  4:00 PM EDT by a video enabled telemedicine application  and verified that I am speaking with the correct person using two identifiers. Location patient: home Location provider: work or home office Persons participating in the virtual visit: patient, provider  I discussed the limitations, risks, security and privacy concerns of performing an evaluation and management service by telephone and the availability of in person appointments. I also discussed with the patient that there may be a patient responsible charge related to this service. The patient expressed understanding and agreed to proceed.  Reason for visit: fatigue  HPI:  History of normocytic anemia noted in April to have dropped from 11 to 10.3 ,  normal iron/.b12/folate and negative FOBT  presents for worsening fatigue.  She denies weight loss,  she does have chronic pain due to multiple degenerative joint issues,  and is not sleeping well due to pain and the increased stressors related to her husband's end stage kidney disease.   Chronic Pain:  she has neck ,  back and shoulder pain with prior surgical interventions and ongoing therapeutic injections by sports medicine.  She takes hydrocodone to manage her pain and her dose has not changed  in recent months.  Refill history confirmed via [place] Controlled Substance databas, accessed by me today..   Lab Results  Component Value Date   WBC 3.7 (L) 11/10/2021   HGB 10.7 (L) 11/10/2021   HCT 31.0 (L) 11/10/2021   MCV 99.4 11/10/2021   PLT 309.0 11/10/2021     ROS: See pertinent positives and negatives per HPI.  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   Prepatellar effusion of right knee 06/09/2019   Aspiration done June 13, 2019   Scoliosis    Spinal stenosis of lumbar region    Squamous cell carcinoma of skin 06/24/2018   R lateral calf   Tubular adenoma of colon     Past Surgical History:  Procedure Laterality Date   ABDOMINAL HYSTERECTOMY     APPENDECTOMY     CARDIAC CATHETERIZATION     normal coronaries, no wall motion abnormalities 11/02/09 [facility])   CATARACT EXTRACTION W/PHACO Left 11/24/2020   Procedure: CATARACT EXTRACTION PHACO AND INTRAOCULAR LENS PLACEMENT ([place]) LEFT 6.09 00:46.7;  Surgeon: [name], MD;  Location: [place];  Service: Ophthalmology;  Laterality: Left;  patient wants early   CATARACT EXTRACTION W/PHACO Right 12/08/2020   Procedure: CATARACT EXTRACTION PHACO AND INTRAOCULAR LENS PLACEMENT ([facility]) RIGHT 3.92 00:49.0;  Surgeon: [name], MD;  Location: [place];  Service: Ophthalmology;  Laterality:  Right;  patient request early   NASAL SINUS SURGERY     OOPHORECTOMY     RADIOLOGY WITH ANESTHESIA  02/26/2012   Procedure: RADIOLOGY WITH ANESTHESIA;  Surgeon: [name], MD;  Location: [place];  Service: Radiology;  Laterality: N/A;   REVERSE SHOULDER ARTHROPLASTY Right 06/27/2013   Procedure: REVERSE RIGHT TOTAL SHOULDER ARTHROPLASTY;  Surgeon: [name], MD;  Location: [place];  Service: Orthopedics;  Laterality: Right;   SHOULDER ARTHROSCOPY WITH ROTATOR CUFF REPAIR AND SUBACROMIAL DECOMPRESSION  2007    left   TONSILLECTOMY  adnoids    Family History  Problem Relation Age of Onset   Thyroid disease Mother    Colon cancer Father 74   Diabetes Father    Heart disease Father    Rectal cancer Father    Breast cancer Paternal Grandmother    Colon cancer Paternal Grandfather    Colon cancer Other        pggm   Stomach cancer Neg Hx    Esophageal cancer Neg Hx     SOCIAL HX: MARRIED.  Working full time from home.   reports that she has never smoked. She has never used smokeless tobacco. She reports current alcohol use of about 2.0 standard drinks of alcohol per week. She reports that she does not use drugs.    Current Outpatient Medications:    amLODipine (NORVASC) 10 MG tablet, TAKE 1 TABLET BY MOUTH EVERY DAY, Disp: 90 tablet, Rfl: 3   buPROPion (WELLBUTRIN XL) 150 MG 24 hr tablet, TAKE 1 TABLET BY MOUTH EVERY DAY IN THE MORNING, Disp: 90 tablet, Rfl: 1   cyanocobalamin (,VITAMIN B-12,) 1000 MCG/ML injection, INJECT 1 ML (1,000 MCG TOTAL) INTO THE MUSCLE ONCE A WEEK. (Patient taking differently: Inject 1,000 mcg into the muscle every 30 (thirty) days.), Disp: 12 mL, Rfl: 6   esomeprazole (NEXIUM) 20 MG capsule, Take 1 capsule (20 mg total) by mouth daily. **PLEASE CONTACT OFFICE TO SCHEDULE FOLLOW UP, Disp: 90 capsule, Rfl: 0   fluticasone (FLONASE) 50 MCG/ACT nasal spray, USE 2 SPRAYS IN EACH NOSTRIL DAILY AS NEEDED (Patient taking differently: Place 2 sprays into both nostrils daily as needed for allergies or rhinitis. USE 2 SPRAYS IN EACH NOSTRIL DAILY AS NEEDED), Disp: 16 g, Rfl: 1   furosemide (LASIX) 20 MG tablet, TAKE 1 TABLET (20 MG TOTAL) BY MOUTH DAILY WITH BREAKFAST AS NEEDED FOR EDEMA, Disp: 90 tablet, Rfl: 1   HYDROcodone-acetaminophen (NORCO) 10-325 MG tablet, Take 1 tablet by mouth every 6 (six) hours as needed., Disp: 120 tablet, Rfl: 0   HYDROcodone-acetaminophen (NORCO) 10-325 MG tablet, Take 1 tablet by mouth every 6 (six) hours as needed., Disp: 120 tablet, Rfl: 0    metoprolol succinate (TOPROL-XL) 25 MG 24 hr tablet, TAKE 1 TABLET BY MOUTH EVERY DAY, Disp: 90 tablet, Rfl: 1   Multiple Vitamin (MULTIVITAMIN) tablet, Take 1 tablet by mouth daily., Disp: , Rfl:    propranolol (INDERAL) 10 MG tablet, TAKE 1/2 TABLET BY MOUTH AS NEEDED FOR ANXIETY, Disp: 15 tablet, Rfl: 1   sodium chloride 1 g tablet, Take 1 tablet (1 g total) by mouth 2 (two) times daily with a meal., Disp: 60 tablet, Rfl: 2   Syringe/Needle, Disp, (SYRINGE 3CC/25GX1") 25G X 1" 3 ML MISC, Use for b12 injections, Disp: 50 each, Rfl: 0   zolpidem (AMBIEN CR) 12.5 MG CR tablet, Take 1 tablet (12.5 mg total) by mouth at bedtime as needed for sleep., Disp: 30 tablet, Rfl:  0   dicyclomine (BENTYL) 20 MG tablet, TAKE 1 TABLET (20 MG TOTAL) BY MOUTH 3 (THREE) TIMES DAILY BEFORE MEALS., Disp: 270 tablet, Rfl: 0   LINZESS 145 MCG CAPS capsule, TAKE 1 CAPSULE BY MOUTH EVERY DAY BEFORE BREAKFAST, Disp: 90 capsule, Rfl: 0   Na Sulfate-K Sulfate-Mg Sulf 17.5-3.13-1.6 GM/177ML SOLN, Take by mouth as directed., Disp: , Rfl:   EXAM:  VITALS per patient if applicable:  GENERAL: alert, oriented, appears well and in no acute distress  HEENT: atraumatic, conjunttiva clear, no obvious abnormalities on inspection of external nose and ears  NECK: normal movements of the head and neck  LUNGS: on inspection no signs of respiratory distress, breathing rate appears normal, no obvious gross SOB, gasping or wheezing  CV: no obvious cyanosis  Adrienne: moves all visible extremities without noticeable abnormality  PSYCH/NEURO: pleasant and cooperative, no obvious depression or anxiety, speech and thought processing grossly intact  ASSESSMENT AND PLAN:  Discussed the following assessment and plan:  Anemia, unspecified type - Plan: CBC with Differential/Platelet, Reticulocytes, Erythropoietin, Protein electrophoresis, serum, IFE AND PE, RANDOM URINE, TSH  Other fatigue  Chronic insomnia  Anemia Etiology unclear. As  recent screenings for deficiencies have been done and were negative.  Ruling out MM, low EPO given mild CKD<  Lab Results  Component Value Date   WBC 3.7 (L) 11/10/2021   HGB 10.7 (L) 11/10/2021   HCT 31.0 (L) 11/10/2021   MCV 99.4 11/10/2021   PLT 309.0 11/10/2021   Lab Results  Component Value Date   IRON 105 09/08/2021   TIBC 415.8 09/08/2021   FERRITIN 101.2 09/08/2021   Lab Results  Component Value Date   TSH 0.706 07/13/2020   Last vitamin B12 and Folate Lab Results  Component Value Date   VITAMINB12 672 09/08/2021   FOLATE >24.2 09/08/2021    Fatigue Likely multifactorial , due to anemia ,  Insomnia and chronic pain.  Trial of ambien CR   Chronic insomnia Changing ambien to [place] cr as a one month trial given early awakening     I discussed the assessment and treatment plan with the patient. The patient was provided an opportunity to ask questions and all were answered. The patient agreed with the plan and demonstrated an understanding of the instructions.   The patient was advised to call back or seek an in-person evaluation if the symptoms worsen or if the condition fails to improve as anticipated.   I spent 20 minutes dedicated to the care of this patient on the date of this encounter to include pre-visit review of her medical history,  Face-to-face time with the patient , and post visit ordering of testing and therapeutics.    [name], MD

## 2021-11-09 NOTE — Patient Instructions (Signed)
Change in [place] to [place] CR (controlled release)  Try alternating wellbutrin dose 150 mg with 300 mg every other day   Labs tomorrow at 10:45 am

## 2021-11-10 ENCOUNTER — Ambulatory Visit: Payer: Self-pay

## 2021-11-10 ENCOUNTER — Other Ambulatory Visit (INDEPENDENT_AMBULATORY_CARE_PROVIDER_SITE_OTHER): Payer: Managed Care, Other (non HMO)

## 2021-11-10 ENCOUNTER — Ambulatory Visit (INDEPENDENT_AMBULATORY_CARE_PROVIDER_SITE_OTHER): Payer: Managed Care, Other (non HMO) | Admitting: Family Medicine

## 2021-11-10 ENCOUNTER — Other Ambulatory Visit: Payer: Self-pay | Admitting: Internal Medicine

## 2021-11-10 VITALS — BP 98/68 | HR 68 | Ht 66.0 in | Wt 115.0 lb

## 2021-11-10 DIAGNOSIS — D649 Anemia, unspecified: Secondary | ICD-10-CM

## 2021-11-10 DIAGNOSIS — G8929 Other chronic pain: Secondary | ICD-10-CM | POA: Diagnosis not present

## 2021-11-10 DIAGNOSIS — M1812 Unilateral primary osteoarthritis of first carpometacarpal joint, left hand: Secondary | ICD-10-CM | POA: Diagnosis not present

## 2021-11-10 DIAGNOSIS — M79645 Pain in left finger(s): Secondary | ICD-10-CM | POA: Diagnosis not present

## 2021-11-10 DIAGNOSIS — M5459 Other low back pain: Secondary | ICD-10-CM

## 2021-11-10 DIAGNOSIS — M48061 Spinal stenosis, lumbar region without neurogenic claudication: Secondary | ICD-10-CM

## 2021-11-10 DIAGNOSIS — M5416 Radiculopathy, lumbar region: Secondary | ICD-10-CM

## 2021-11-10 LAB — CBC WITH DIFFERENTIAL/PLATELET
Basophils Absolute: 0 10*3/uL (ref 0.0–0.1)
Basophils Relative: 0.7 % (ref 0.0–3.0)
Eosinophils Absolute: 0.1 10*3/uL (ref 0.0–0.7)
Eosinophils Relative: 2 % (ref 0.0–5.0)
HCT: 31 % — ABNORMAL LOW (ref 36.0–46.0)
Hemoglobin: 10.7 g/dL — ABNORMAL LOW (ref 12.0–15.0)
Lymphocytes Relative: 31.1 % (ref 12.0–46.0)
Lymphs Abs: 1.2 10*3/uL (ref 0.7–4.0)
MCHC: 34.7 g/dL (ref 30.0–36.0)
MCV: 99.4 fl (ref 78.0–100.0)
Monocytes Absolute: 0.6 10*3/uL (ref 0.1–1.0)
Monocytes Relative: 16.4 % — ABNORMAL HIGH (ref 3.0–12.0)
Neutro Abs: 1.8 10*3/uL (ref 1.4–7.7)
Neutrophils Relative %: 49.8 % (ref 43.0–77.0)
Platelets: 309 10*3/uL (ref 150.0–400.0)
RBC: 3.12 Mil/uL — ABNORMAL LOW (ref 3.87–5.11)
RDW: 13.3 % (ref 11.5–15.5)
WBC: 3.7 10*3/uL — ABNORMAL LOW (ref 4.0–10.5)

## 2021-11-10 LAB — RETICULOCYTES
ABS Retic: 62800 cells/uL (ref 20000–80000)
Retic Ct Pct: 2 %

## 2021-11-10 NOTE — Assessment & Plan Note (Signed)
Patient did respond well to the injection previously but is having worsening pain again.  Another epidural could be beneficial.  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  Attempted trigger point injections on the lower back today.  We will see if that will be helpful but I think that this is the underlying problem is more of the spine itself.  Follow-up again in 6 weeks after that

## 2021-11-10 NOTE — Assessment & Plan Note (Signed)
Repeat injection given again today.  Patient unfortunately has to take care of her husband who is hopefully getting a kidney transplant in the near future.  Patient has been doing a lot more manual work to be contributing to some of the pain as well.  Patient would be unable to get any type of surgical intervention at this moment. F/u 3 months

## 2021-11-10 NOTE — Patient Instructions (Addendum)
Injected thumb and did trigger points Epidural: [phone] See me again in 3 months  If you need [place] you know where we are

## 2021-11-12 ENCOUNTER — Other Ambulatory Visit: Payer: Self-pay | Admitting: Family

## 2021-11-13 DIAGNOSIS — F5104 Psychophysiologic insomnia: Secondary | ICD-10-CM | POA: Insufficient documentation

## 2021-11-13 NOTE — Assessment & Plan Note (Signed)
Changing ambien to [employer] cr as a one month trial given early awakening

## 2021-11-13 NOTE — Assessment & Plan Note (Signed)
Likely multifactorial , due to anemia ,  Insomnia and chronic pain.  Trial of ambien CR

## 2021-11-13 NOTE — Assessment & Plan Note (Signed)
Etiology unclear. As recent screenings for deficiencies have been done and were negative.  Ruling out MM, low EPO given mild CKD<  Lab Results  Component Value Date   WBC 3.7 (L) 11/10/2021   HGB 10.7 (L) 11/10/2021   HCT 31.0 (L) 11/10/2021   MCV 99.4 11/10/2021   PLT 309.0 11/10/2021   Lab Results  Component Value Date   IRON 105 09/08/2021   TIBC 415.8 09/08/2021   FERRITIN 101.2 09/08/2021   Lab Results  Component Value Date   TSH 0.706 07/13/2020   Last vitamin B12 and Folate Lab Results  Component Value Date   VITAMINB12 672 09/08/2021   FOLATE >24.2 09/08/2021

## 2021-11-14 LAB — PROTEIN ELECTROPHORESIS, SERUM
Albumin ELP: 4.3 g/dL (ref 3.8–4.8)
Alpha 1: 0.3 g/dL (ref 0.2–0.3)
Alpha 2: 0.7 g/dL (ref 0.5–0.9)
Beta 2: 0.3 g/dL (ref 0.2–0.5)
Beta Globulin: 0.4 g/dL (ref 0.4–0.6)
Gamma Globulin: 0.8 g/dL (ref 0.8–1.7)
Total Protein: 6.7 g/dL (ref 6.1–8.1)

## 2021-11-14 LAB — ERYTHROPOIETIN: Erythropoietin: 14.7 m[IU]/mL (ref 2.6–18.5)

## 2021-11-14 LAB — IFE AND PE, RANDOM URINE
% BETA, Urine: 0 %
ALBUMIN, U: 100 %
ALPHA 1 URINE: 0 %
ALPHA-2-GLOBULIN, U: 0 %
GAMMA GLOBULIN URINE: 0 %
Protein, Ur: 8.5 mg/dL

## 2021-11-21 ENCOUNTER — Encounter: Payer: Self-pay | Admitting: Internal Medicine

## 2021-11-22 ENCOUNTER — Ambulatory Visit
Admission: RE | Admit: 2021-11-22 | Discharge: 2021-11-22 | Disposition: A | Discharge status: Home / Self Care | Payer: Self-pay | Source: Ambulatory Visit | Attending: Family Medicine | Admitting: Family Medicine

## 2021-11-22 ENCOUNTER — Ambulatory Visit
Admission: RE | Admit: 2021-11-22 | Discharge: 2021-11-22 | Disposition: A | Discharge status: Home / Self Care | Payer: Managed Care, Other (non HMO) | Source: Ambulatory Visit | Attending: Family Medicine | Admitting: Family Medicine

## 2021-11-22 ENCOUNTER — Other Ambulatory Visit: Payer: Self-pay | Admitting: Family Medicine

## 2021-11-22 DIAGNOSIS — M5416 Radiculopathy, lumbar region: Secondary | ICD-10-CM

## 2021-11-22 MED ORDER — IOPAMIDOL (ISOVUE-M 200) INJECTION 41%
1.0000 mL | Freq: Once | INTRAMUSCULAR | Status: AC
  Administered 2021-11-22: 1 mL via EPIDURAL

## 2021-11-22 MED ORDER — METHYLPREDNISOLONE ACETATE 40 MG/ML INJ SUSP (RADIOLOG
80.0000 mg | Freq: Once | INTRAMUSCULAR | Status: AC
  Administered 2021-11-22: 80 mg via EPIDURAL

## 2021-11-22 NOTE — Discharge Instructions (Addendum)

## 2021-11-23 ENCOUNTER — Inpatient Hospital Stay: Admission: RE | Admit: 2021-11-23 | Payer: Managed Care, Other (non HMO) | Source: Ambulatory Visit

## 2021-11-25 ENCOUNTER — Ambulatory Visit (AMBULATORY_SURGERY_CENTER): Payer: Managed Care, Other (non HMO) | Admitting: Internal Medicine

## 2021-11-25 ENCOUNTER — Encounter: Payer: Self-pay | Admitting: Internal Medicine

## 2021-11-25 VITALS — BP 126/75 | HR 61 | Temp 98.0°F | Resp 14 | Ht 66.0 in | Wt 115.0 lb

## 2021-11-25 DIAGNOSIS — Z800 Family history of malignant neoplasm of digestive organs: Secondary | ICD-10-CM

## 2021-11-25 DIAGNOSIS — Z09 Encounter for follow-up examination after completed treatment for conditions other than malignant neoplasm: Secondary | ICD-10-CM | POA: Diagnosis present

## 2021-11-25 DIAGNOSIS — Z86010 Personal history of colonic polyps: Secondary | ICD-10-CM | POA: Diagnosis not present

## 2021-11-25 MED ORDER — SODIUM CHLORIDE 0.9 % IV SOLN: 500.0000 mL | Freq: Once | INTRAVENOUS | Status: DC

## 2021-11-25 NOTE — Progress Notes (Signed)
Pt resting comfortably. VSS. Airway intact. SBAR complete to RN. All questions answered.   

## 2021-11-25 NOTE — Op Note (Signed)
[facility] Patient Name: Adrienne Robinson Procedure Date: 11/25/2021 1:57 PM MRN: [id number] Endoscopist: Jerene Bears , MD Age: 66 Referring MD:  Date of Birth: Jul 18, 1955 Gender: Female Account #: [id number] Procedure:                Colonoscopy Indications:              High risk colon cancer surveillance: Personal                            history of non-advanced adenoma, Family history of                            colon cancer in a first-degree relative, Last                            colonoscopy: May 2018 Medicines:                Monitored Anesthesia Care Procedure:                Pre-Anesthesia Assessment:                           - Prior to the procedure, a History and Physical                            was performed, and patient medications and                            allergies were reviewed. The patient's tolerance of                            previous anesthesia was also reviewed. The risks                            and benefits of the procedure and the sedation                            options and risks were discussed with the patient.                            All questions were answered, and informed consent                            was obtained. Prior Anticoagulants: The patient has                            taken no previous anticoagulant or antiplatelet                            agents. ASA Grade Assessment: II - A patient with                            mild systemic disease. After reviewing the risks  and benefits, the patient was deemed in                            satisfactory condition to undergo the procedure.                           After obtaining informed consent, the colonoscope                            was passed under direct vision. Throughout the                            procedure, the patient's blood pressure, pulse, and                            oxygen saturations were monitored continuously.  The                            0405 PCF-H190TL [place] SB Colonoscope was introduced                            through the anus and advanced to the cecum,                            identified by appendiceal orifice and ileocecal                            valve. The colonoscopy was performed with                            difficulty due to restricted mobility of the colon                            and a tortuous colon. The patient tolerated the                            procedure well. The quality of the bowel                            preparation was adequate (requiring copious                            irrigation and lavage). The ileocecal valve,                            appendiceal orifice, and rectum were photographed. Scope In: 2:11:05 PM Scope Out: 2:37:01 PM Scope Withdrawal Time: 0 hours 9 minutes 26 seconds  Total Procedure Duration: 0 hours 25 minutes 56 seconds  Findings:                 The digital rectal exam was normal.                           The entire examined colon appeared normal on direct  and retroflexion views. Complications:            No immediate complications. Estimated Blood Loss:     Estimated blood loss: none. Impression:               - The entire examined colon is normal on direct and                            retroflexion views.                           - No specimens collected. Recommendation:           - Patient has a contact number available for                            emergencies. The signs and symptoms of potential                            delayed complications were discussed with the                            patient. Return to normal activities tomorrow.                            Written discharge instructions were provided to the                            patient.                           - Resume previous diet.                           - Continue present medications.                           -  Repeat colonoscopy in 5 years for surveillance                            with 2 day prep. Jerene Bears, MD 11/25/2021 2:42:06 PM This report has been signed electronically.

## 2021-11-25 NOTE — Patient Instructions (Signed)
Read all of the handouts given to you  by your recovery room nurse.  YOU HAD AN ENDOSCOPIC PROCEDURE TODAY AT [facility]:   Refer to the procedure report that was given to you for any specific questions about what was found during the examination.  If the procedure report does not answer your questions, please call your gastroenterologist to clarify.  If you requested that your care partner not be given the details of your procedure findings, then the procedure report has been included in a sealed envelope for you to review at your convenience later.  YOU SHOULD EXPECT: Some feelings of bloating in the abdomen. Passage of more gas than usual.  Walking can help get rid of the air that was put into your GI tract during the procedure and reduce the bloating. If you had a lower endoscopy (such as a colonoscopy or flexible sigmoidoscopy) you may notice spotting of blood in your stool or on the toilet paper. If you underwent a bowel prep for your procedure, you may not have a normal bowel movement for a few days.  Please Note:  You might notice some irritation and congestion in your nose or some drainage.  This is from the oxygen used during your procedure.  There is no need for concern and it should clear up in a day or so.  SYMPTOMS TO REPORT IMMEDIATELY:  Following lower endoscopy (colonoscopy or flexible sigmoidoscopy):  Excessive amounts of blood in the stool  Significant tenderness or worsening of abdominal pains  Swelling of the abdomen that is new, acute  Fever of 100F or higher   For urgent or emergent issues, a gastroenterologist can be reached at any hour by calling [phone]. Do not use MyChart messaging for urgent concerns.    DIET:  We do recommend a small meal at first, but then you may proceed to your regular diet.  Drink plenty of fluids but you should avoid alcoholic beverages for 24 hours.  ACTIVITY:  You should plan to take it easy for the rest of today  and you should NOT DRIVE or use heavy machinery until tomorrow (because of the sedation medicines used during the test).    FOLLOW UP: Our staff will call the number listed on your records the next business day following your procedure.  We will call around 7:15- 8:00 am to check on you and address any questions or concerns that you may have regarding the information given to you following your procedure. If we do not reach you, we will leave a message.      SIGNATURES/CONFIDENTIALITY: You and/or your care partner have signed paperwork which will be entered into your electronic medical record.  These signatures attest to the fact that that the information above on your After Visit Summary has been reviewed and is understood.  Full responsibility of the confidentiality of this discharge information lies with you and/or your care-partner.

## 2021-11-25 NOTE — Progress Notes (Signed)
Patient states that she's very cold in RR.  4 blankets on her at this time.  States #7 lower abd pain.  Encouraged to gass gas.  Ten minutes late states that the pain is "better" number #5.  Talking and laughing with friend.  Giving her a little longer to pass more gas.  Patient states that she doesn't want any intervention.  States that she will pass it herself.

## 2021-11-25 NOTE — Progress Notes (Signed)
Pt's states no medical or surgical changes since previsit or office visit. VS assessed by AS 

## 2021-11-25 NOTE — Progress Notes (Signed)
GASTROENTEROLOGY PROCEDURE H&P NOTE   Primary Care Physician: [name], MD    Reason for Procedure:  Personal history of tubular adenoma of the colon and family history of colon cancer in patient's father  Plan:    Colonoscopy  Patient is appropriate for endoscopic procedure(s) in the ambulatory ([place]) setting.  The nature of the procedure, as well as the risks, benefits, and alternatives were carefully and thoroughly reviewed with the patient. Ample time for discussion and questions allowed. The patient understood, was satisfied, and agreed to proceed.     HPI: Adrienne Robinson is a 66 y.o. female who presents for colonoscopy.  Medical history as below.  Tolerated the prep.  No recent chest pain or shortness of breath.  No abdominal pain today.  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   Prepatellar effusion of right knee 06/09/2019   Aspiration done June 13, 2019   Scoliosis    Spinal stenosis of lumbar region    Squamous cell carcinoma of skin 06/24/2018   R lateral calf   Tubular adenoma of colon     Past Surgical History:  Procedure Laterality Date   ABDOMINAL HYSTERECTOMY     APPENDECTOMY     CARDIAC CATHETERIZATION     normal coronaries, no wall motion abnormalities 11/02/09 [facility])   CATARACT EXTRACTION W/PHACO Left 11/24/2020   Procedure: CATARACT EXTRACTION PHACO AND INTRAOCULAR LENS PLACEMENT ([place]) LEFT 6.09 00:46.7;  Surgeon: [name], MD;  Location: [place];   Service: Ophthalmology;  Laterality: Left;  patient wants early   CATARACT EXTRACTION W/PHACO Right 12/08/2020   Procedure: CATARACT EXTRACTION PHACO AND INTRAOCULAR LENS PLACEMENT ([facility]) RIGHT 3.92 00:49.0;  Surgeon: [name], MD;  Location: [place];  Service: Ophthalmology;  Laterality: Right;  patient request early   NASAL SINUS SURGERY     OOPHORECTOMY     RADIOLOGY WITH ANESTHESIA  02/26/2012   Procedure: RADIOLOGY WITH ANESTHESIA;  Surgeon: [name], MD;  Location: [place];  Service: Radiology;  Laterality: N/A;   REVERSE SHOULDER ARTHROPLASTY Right 06/27/2013   Procedure: REVERSE RIGHT TOTAL SHOULDER ARTHROPLASTY;  Surgeon: [name], MD;  Location: [place];  Service: Orthopedics;  Laterality: Right;   SHOULDER ARTHROSCOPY WITH ROTATOR CUFF REPAIR AND SUBACROMIAL DECOMPRESSION  2007   left   TONSILLECTOMY  adnoids    Prior to Admission medications   Medication Sig Start Date End Date Taking? Authorizing Provider  amLODipine (NORVASC) 10 MG tablet TAKE 1 TABLET BY MOUTH EVERY DAY 07/11/21  Yes [name], MD  buPROPion (WELLBUTRIN XL) 150 MG 24 hr tablet TAKE 1 TABLET BY MOUTH EVERY DAY IN THE MORNING 10/12/21  Yes [name], MD  esomeprazole (NEXIUM) 20 MG capsule Take 1 capsule (20 mg total) by mouth daily. **PLEASE CONTACT OFFICE TO SCHEDULE FOLLOW UP 10/13/21  Yes Arias Weinert, Lajuan Lines, MD  furosemide (LASIX) 20 MG  tablet TAKE 1 TABLET (20 MG TOTAL) BY MOUTH DAILY WITH BREAKFAST AS NEEDED FOR EDEMA 04/14/21  Yes [name], MD  LINZESS 145 MCG CAPS capsule TAKE 1 CAPSULE BY MOUTH EVERY DAY BEFORE BREAKFAST 11/10/21  Yes Reneta Niehaus, Lajuan Lines, MD  metoprolol succinate (TOPROL-XL) 25 MG 24 hr tablet TAKE 1 TABLET BY MOUTH EVERY DAY 07/19/21  Yes [name], MD  Multiple Vitamin (MULTIVITAMIN) tablet Take 1 tablet by mouth daily.   Yes [provider]  sodium chloride 1 g tablet Take 1 tablet (1 g total) by mouth 2 (two) times daily with a meal.  09/15/20  Yes [name], MD  zolpidem (AMBIEN CR) 12.5 MG CR tablet Take 1 tablet (12.5 mg total) by mouth at bedtime as needed for sleep. 11/09/21  Yes [name], MD  cyanocobalamin (,VITAMIN B-12,) 1000 MCG/ML injection INJECT 1 ML (1,000 MCG TOTAL) INTO THE MUSCLE ONCE A WEEK. Patient taking differently: Inject 1,000 mcg into the muscle every 30 (thirty) days. 11/23/20   [name], MD  dicyclomine (BENTYL) 20 MG tablet TAKE 1 TABLET (20 MG TOTAL) BY MOUTH 3 (THREE) TIMES DAILY BEFORE MEALS. Patient not taking: Reported on 11/25/2021 09/07/21   Jerene Bears, MD  fluticasone [facility]) 50 MCG/ACT nasal spray USE 2 SPRAYS IN [facility] NOSTRIL DAILY AS NEEDED Patient not taking: Reported on 11/25/2021 05/29/18   [name], MD  HYDROcodone-acetaminophen (NORCO) 10-325 MG tablet Take 1 tablet by mouth every 6 (six) hours as needed. Patient not taking: Reported on 11/25/2021 09/12/21   [name], MD  HYDROcodone-acetaminophen [facility]) 10-325 MG tablet Take 1 tablet by mouth every 6 (six) hours as needed. Patient not taking: Reported on 11/25/2021 09/12/21   [name], MD  propranolol (INDERAL) 10 MG tablet TAKE 1/2 TABLET BY MOUTH AS NEEDED FOR ANXIETY 10/21/21   Dutch Quint B, FNP  Syringe/Needle, Disp, (SYRINGE 3CC/25GX1") 25G X 1" 3 ML MISC Use for b12 injections Patient not taking: Reported on 11/25/2021 08/21/18   [name], MD    Current Outpatient Medications  Medication Sig Dispense Refill   amLODipine (NORVASC) 10 MG tablet TAKE 1 TABLET BY MOUTH EVERY DAY 90 tablet 3   buPROPion (WELLBUTRIN XL) 150 MG 24 hr tablet TAKE 1 TABLET BY MOUTH EVERY DAY IN THE MORNING 90 tablet 1   esomeprazole (NEXIUM) 20 MG capsule Take 1 capsule (20 mg total) by mouth daily. **PLEASE CONTACT OFFICE TO SCHEDULE FOLLOW UP 90 capsule 0   furosemide (LASIX) 20 MG tablet TAKE 1 TABLET (20 MG TOTAL) BY MOUTH DAILY WITH BREAKFAST AS NEEDED FOR EDEMA 90 tablet 1   LINZESS 145 MCG CAPS capsule  TAKE 1 CAPSULE BY MOUTH EVERY DAY BEFORE BREAKFAST 90 capsule 0   metoprolol succinate (TOPROL-XL) 25 MG 24 hr tablet TAKE 1 TABLET BY MOUTH EVERY DAY 90 tablet 1   Multiple Vitamin (MULTIVITAMIN) tablet Take 1 tablet by mouth daily.     sodium chloride 1 g tablet Take 1 tablet (1 g total) by mouth 2 (two) times daily with a meal. 60 tablet 2   zolpidem (AMBIEN CR) 12.5 MG CR tablet Take 1 tablet (12.5 mg total) by mouth at bedtime as needed for sleep. 30 tablet 0   cyanocobalamin (,VITAMIN B-12,) 1000 MCG/ML injection INJECT 1 ML (1,000 MCG TOTAL) INTO THE MUSCLE ONCE A WEEK. (Patient taking differently: Inject 1,000 mcg into the muscle every 30 (thirty) days.) 12 mL 6   dicyclomine (  BENTYL) 20 MG tablet TAKE 1 TABLET (20 MG TOTAL) BY MOUTH 3 (THREE) TIMES DAILY BEFORE MEALS. (Patient not taking: Reported on 11/25/2021) 270 tablet 0   fluticasone (FLONASE) 50 MCG/ACT nasal spray USE 2 SPRAYS IN EACH NOSTRIL DAILY AS NEEDED (Patient not taking: Reported on 11/25/2021) 16 g 1   HYDROcodone-acetaminophen (NORCO) 10-325 MG tablet Take 1 tablet by mouth every 6 (six) hours as needed. (Patient not taking: Reported on 11/25/2021) 120 tablet 0   HYDROcodone-acetaminophen (NORCO) 10-325 MG tablet Take 1 tablet by mouth every 6 (six) hours as needed. (Patient not taking: Reported on 11/25/2021) 120 tablet 0   propranolol (INDERAL) 10 MG tablet TAKE 1/2 TABLET BY MOUTH AS NEEDED FOR ANXIETY 15 tablet 1   Syringe/Needle, Disp, (SYRINGE 3CC/25GX1") 25G X 1" 3 ML MISC Use for b12 injections (Patient not taking: Reported on 11/25/2021) 50 each 0   Current Facility-Administered Medications  Medication Dose Route Frequency Provider Last Rate Last Admin   0.9 %  sodium chloride infusion  500 mL Intravenous Once Ayodele Hartsock, Lajuan Lines, MD        Allergies as of 11/25/2021 - Review Complete 11/25/2021  Allergen Reaction Noted   Buspirone Other (See Comments) 08/10/2020   Celebrex [celecoxib] Swelling 10/29/2020   Adhesive  [tape] Rash and Other (See Comments) 11/03/2010   Morphine and related Rash 04/16/2012    Family History  Problem Relation Age of Onset   Thyroid disease Mother    Colon cancer Father 38   Diabetes Father    Heart disease Father    Rectal cancer Father    Breast cancer Paternal Grandmother    Colon cancer Paternal Grandfather    Colon cancer Other        pggm   Stomach cancer Neg Hx    Esophageal cancer Neg Hx     Social History   Socioeconomic History   Marital status: Married    Spouse name: Not on file   Number of children: 0   Years of education: 18   Highest education level: Not on file  Occupational History   Occupation: Nurse executive   Tobacco Use   Smoking status: Never   Smokeless tobacco: Never  Vaping Use   Vaping Use: Never used  Substance and Sexual Activity   Alcohol use: Yes    Alcohol/week: 2.0 standard drinks of alcohol    Types: 2 Glasses of wine per week    Comment: 2-3 week with dinner   Drug use: No   Sexual activity: Not on file  Other Topics Concern   Not on file  Social History Narrative   Lives at home with her husband.   Right-handed.   2-3 diet cokes per day.   Social Determinants of Health   Financial Resource Strain: Not on file  Food Insecurity: Not on file  Transportation Needs: Not on file  Physical Activity: Not on file  Stress: Not on file  Social Connections: Not on file  Intimate Partner Violence: Not on file    Physical Exam: Vital signs in last 24 hours: '@BP'$  (!) 154/92   Pulse 61   Temp 98 F (36.7 C) (Skin)   Ht '5\' 6"'$  (1.676 m)   Wt 115 lb (52.2 kg)   SpO2 98%   BMI 18.56 kg/m  GEN: NAD EYE: Sclerae anicteric ENT: MMM CV: Non-tachycardic Pulm: CTA b/l GI: Soft, NT/ND NEURO:  Alert & Oriented x 3   Zenovia Jarred, MD [facility]  11/25/2021 1:58 PM

## 2021-11-27 ENCOUNTER — Other Ambulatory Visit: Payer: Self-pay | Admitting: Internal Medicine

## 2021-11-27 DIAGNOSIS — D649 Anemia, unspecified: Secondary | ICD-10-CM

## 2021-11-28 ENCOUNTER — Telehealth: Payer: Self-pay | Admitting: *Deleted

## 2021-11-28 ENCOUNTER — Encounter: Payer: Self-pay | Admitting: Internal Medicine

## 2021-11-28 NOTE — Telephone Encounter (Signed)
Attempt for follow up phone call. No answer at number given.  Left message on voicemail.   

## 2021-11-29 NOTE — Telephone Encounter (Signed)
In the future please determine which [facility] since there are 2.  Thanks

## 2021-12-02 ENCOUNTER — Other Ambulatory Visit: Payer: Managed Care, Other (non HMO)

## 2021-12-06 ENCOUNTER — Other Ambulatory Visit: Payer: Self-pay | Admitting: Internal Medicine

## 2021-12-09 ENCOUNTER — Telehealth (INDEPENDENT_AMBULATORY_CARE_PROVIDER_SITE_OTHER): Payer: Managed Care, Other (non HMO) | Admitting: Internal Medicine

## 2021-12-09 ENCOUNTER — Encounter: Payer: Self-pay | Admitting: Internal Medicine

## 2021-12-09 DIAGNOSIS — H671 Otitis media in diseases classified elsewhere, right ear: Secondary | ICD-10-CM | POA: Insufficient documentation

## 2021-12-09 DIAGNOSIS — B370 Candidal stomatitis: Secondary | ICD-10-CM | POA: Diagnosis not present

## 2021-12-09 MED ORDER — FLUCONAZOLE 150 MG PO TABS: 150.0000 mg | ORAL_TABLET | Freq: Every day | ORAL | 0 refills | Status: DC

## 2021-12-09 MED ORDER — AMOXICILLIN-POT CLAVULANATE 875-125 MG PO TABS: 1.0000 | ORAL_TABLET | Freq: Two times a day (BID) | ORAL | 0 refills | Status: DC

## 2021-12-09 NOTE — Progress Notes (Signed)
Virtual Visit via [place]   Note    This format is felt to be most appropriate for this patient at this time.  All issues noted in this document were discussed and addressed.  No physical exam was performed (except for noted visual exam findings with Video Visits).   I connected with Adrienne Robinson  on 12/09/21 at  8:30 AM EDT by a video enabled telemedicine application  and verified that I am speaking with the correct person using two identifiers. Location patient: home Location provider: work or home office Persons participating in the virtual visit: patient, provider  I discussed the limitations, risks, security and privacy concerns of performing an evaluation and management service by telephone and the availability of in person appointments. I also discussed with the patient that there may be a patient responsible charge related to this service. The patient expressed understanding and agreed to proceed.   Reason for visit: thrush,  rhinorrhea, right ear pain  HPI:  66 yr old female with h/o allergic rhinitis managed with Flonase presents with 2 day history of sinus congestion, rhinorrhea ,  PND , and  tongue feeling thick ,  trouble swallowing,  white coating on tongue in setting of increases rhinorrhea,  body aches,    hoem test for COVID is negative x 2 day s.  Yesterday the right ear starting hurting and she notes decreased hearing out of ear .  No ear discharge  .  Denies fevers,  neck stiffness and headache .  Denies dyspnea, pleurisy , severe cough and wheezing   ROS: See pertinent positives and negatives per HPI.  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   Prepatellar effusion of right knee 06/09/2019   Aspiration done June 13, 2019   Scoliosis    Spinal stenosis of lumbar region    Squamous cell carcinoma of skin 06/24/2018   R lateral calf   Tubular adenoma of colon     Past Surgical History:  Procedure Laterality Date   ABDOMINAL HYSTERECTOMY     APPENDECTOMY     CARDIAC CATHETERIZATION     normal coronaries, no wall motion abnormalities 11/02/09 [facility])   CATARACT EXTRACTION W/PHACO Left 11/24/2020   Procedure: CATARACT EXTRACTION PHACO AND INTRAOCULAR LENS PLACEMENT ([place]) LEFT 6.09 00:46.7;  Surgeon: [name], MD;  Location: [place];  Service: Ophthalmology;  Laterality: Left;  patient wants early   CATARACT EXTRACTION W/PHACO Right 12/08/2020   Procedure: CATARACT EXTRACTION PHACO AND INTRAOCULAR LENS PLACEMENT ([facility]) RIGHT 3.92 00:49.0;  Surgeon: [name], MD;  Location: [place];  Service: Ophthalmology;  Laterality: Right;  patient request early   NASAL SINUS SURGERY     OOPHORECTOMY     RADIOLOGY WITH ANESTHESIA  02/26/2012   Procedure: RADIOLOGY WITH ANESTHESIA;  Surgeon: [name], MD;  Location: [place];  Service: Radiology;  Laterality: N/A;   REVERSE SHOULDER ARTHROPLASTY Right 06/27/2013  Procedure: REVERSE RIGHT TOTAL SHOULDER ARTHROPLASTY;  Surgeon: [name], MD;  Location: [place];  Service: Orthopedics;  Laterality: Right;   SHOULDER ARTHROSCOPY WITH ROTATOR CUFF REPAIR AND SUBACROMIAL DECOMPRESSION  2007   left   TONSILLECTOMY  adnoids    Family History  Problem Relation Age of Onset   Thyroid disease Mother    Colon cancer Father 56   Diabetes Father    Heart disease Father    Rectal cancer Father    Breast cancer Paternal Grandmother    Colon cancer Paternal Grandfather    Colon cancer Other        pggm    Stomach cancer Neg Hx    Esophageal cancer Neg Hx     SOCIAL HX:  reports that she has never smoked. She has never used smokeless tobacco. She reports current alcohol use of about 2.0 standard drinks of alcohol per week. She reports that she does not use drugs.    Current Outpatient Medications:    amLODipine (NORVASC) 10 MG tablet, TAKE 1 TABLET BY MOUTH EVERY DAY, Disp: 90 tablet, Rfl: 3   amoxicillin-clavulanate (AUGMENTIN) 875-125 MG tablet, Take 1 tablet by mouth 2 (two) times daily., Disp: 14 tablet, Rfl: 0   buPROPion (WELLBUTRIN XL) 150 MG 24 hr tablet, TAKE 1 TABLET BY MOUTH EVERY DAY IN THE MORNING, Disp: 90 tablet, Rfl: 1   cyanocobalamin (,VITAMIN B-12,) 1000 MCG/ML injection, INJECT 1 ML (1,000 MCG TOTAL) INTO THE MUSCLE ONCE A WEEK. (Patient taking differently: Inject 1,000 mcg into the muscle every 30 (thirty) days.), Disp: 12 mL, Rfl: 6   dicyclomine (BENTYL) 20 MG tablet, TAKE 1 TABLET (20 MG TOTAL) BY MOUTH 3 (THREE) TIMES DAILY BEFORE MEALS., Disp: 270 tablet, Rfl: 0   esomeprazole (NEXIUM) 20 MG capsule, Take 1 capsule (20 mg total) by mouth daily. **PLEASE CONTACT OFFICE TO SCHEDULE FOLLOW UP, Disp: 90 capsule, Rfl: 0   fluconazole (DIFLUCAN) 150 MG tablet, Take 1 tablet (150 mg total) by mouth daily., Disp: 3 tablet, Rfl: 0   fluticasone (FLONASE) 50 MCG/ACT nasal spray, USE 2 SPRAYS IN EACH NOSTRIL DAILY AS NEEDED, Disp: 16 g, Rfl: 1   furosemide (LASIX) 20 MG tablet, TAKE 1 TABLET (20 MG TOTAL) BY MOUTH DAILY WITH BREAKFAST AS NEEDED FOR EDEMA, Disp: 90 tablet, Rfl: 1   LINZESS 145 MCG CAPS capsule, TAKE 1 CAPSULE BY MOUTH EVERY DAY BEFORE BREAKFAST, Disp: 90 capsule, Rfl: 0   metoprolol succinate (TOPROL-XL) 25 MG 24 hr tablet, TAKE 1 TABLET BY MOUTH EVERY DAY, Disp: 90 tablet, Rfl: 1   Multiple Vitamin (MULTIVITAMIN) tablet, Take 1 tablet by mouth daily., Disp: , Rfl:    propranolol (INDERAL) 10 MG tablet, TAKE 1/2 TABLET BY MOUTH AS NEEDED FOR ANXIETY, Disp: 15 tablet,  Rfl: 1   sodium chloride 1 g tablet, Take 1 tablet (1 g total) by mouth 2 (two) times daily with a meal., Disp: 60 tablet, Rfl: 2   Syringe/Needle, Disp, (SYRINGE 3CC/25GX1") 25G X 1" 3 ML MISC, Use for b12 injections, Disp: 50 each, Rfl: 0   zolpidem (AMBIEN CR) 12.5 MG CR tablet, Take 1 tablet (12.5 mg total) by mouth at bedtime as needed for sleep., Disp: 30 tablet, Rfl: 0  EXAM:  VITALS per patient if applicable:  GENERAL: alert, oriented, appears well and in no acute distress Oropharynx:  tongue is covered with white coating   HEENT: atraumatic, conjunttiva clear, no obvious abnormalities on inspection of external nose and ears  NECK: normal movements of the head and neck  LUNGS: on inspection no signs of respiratory distress, breathing rate appears normal, no obvious gross SOB, gasping or wheezing  CV: no obvious cyanosis  MS: moves all visible extremities without noticeable abnormality  PSYCH/NEURO: pleasant and cooperative, no obvious depression or anxiety, speech and thought processing grossly intact  ASSESSMENT AND PLAN:  Discussed the following assessment and plan:  Oral thrush  Otitis media in diseases classified elsewhere, right ear  Oral thrush Etiology may be use of steroid nasal spray.  rx fluconazole 150 mg daily x 3 days   Otitis media in diseases classified elsewhere, right ear Secondary to sinus congrstion.  augmentin prescribed.  Continue use of afrin .     I discussed the assessment and treatment plan with the patient. The patient was provided an opportunity to ask questions and all were answered. The patient agreed with the plan and demonstrated an understanding of the instructions.   The patient was advised to call back or seek an in-person evaluation if the symptoms worsen or if the condition fails to improve as anticipated.   I spent 20 minutes dedicated to the care of this patient on the date of this encounter to include pre-visit review of her  medical history,  Face-to-face time with the patient , and post visit ordering of testing and therapeutics.    [name], MD

## 2021-12-09 NOTE — Assessment & Plan Note (Signed)
Secondary to sinus congrstion.  augmentin prescribed.  Continue use of afrin .

## 2021-12-09 NOTE — Assessment & Plan Note (Signed)
Etiology may be use of steroid nasal spray.  rx fluconazole 150 mg daily x 3 days

## 2021-12-12 ENCOUNTER — Encounter: Payer: Self-pay | Admitting: Internal Medicine

## 2021-12-12 MED ORDER — BENZONATATE 200 MG PO CAPS: 200.0000 mg | ORAL_CAPSULE | Freq: Three times a day (TID) | ORAL | 1 refills | Status: DC | PRN

## 2021-12-14 ENCOUNTER — Encounter: Payer: Self-pay | Admitting: Internal Medicine

## 2021-12-16 ENCOUNTER — Other Ambulatory Visit: Payer: Self-pay | Admitting: Internal Medicine

## 2021-12-19 ENCOUNTER — Telehealth (INDEPENDENT_AMBULATORY_CARE_PROVIDER_SITE_OTHER): Payer: Managed Care, Other (non HMO) | Admitting: Internal Medicine

## 2021-12-19 ENCOUNTER — Encounter: Payer: Self-pay | Admitting: Internal Medicine

## 2021-12-19 VITALS — BP 130/80 | Ht 66.0 in | Wt 115.0 lb

## 2021-12-19 DIAGNOSIS — J189 Pneumonia, unspecified organism: Secondary | ICD-10-CM

## 2021-12-19 DIAGNOSIS — R058 Other specified cough: Secondary | ICD-10-CM | POA: Diagnosis not present

## 2021-12-19 HISTORY — DX: Pneumonia, unspecified organism: J18.9

## 2021-12-19 MED ORDER — HYDROCOD POLI-CHLORPHE POLI ER 10-8 MG/5ML PO SUER: 5.0000 mL | Freq: Two times a day (BID) | ORAL | 0 refills | Status: DC | PRN

## 2021-12-19 MED ORDER — DOXYCYCLINE HYCLATE 100 MG PO TABS: 100.0000 mg | ORAL_TABLET | Freq: Two times a day (BID) | ORAL | 0 refills | Status: AC

## 2021-12-19 MED ORDER — PREDNISONE 10 MG PO TABS: ORAL_TABLET | ORAL | 0 refills | Status: DC

## 2021-12-19 MED ORDER — LEVOFLOXACIN 500 MG PO TABS: 500.0000 mg | ORAL_TABLET | Freq: Every day | ORAL | 0 refills | Status: AC

## 2021-12-19 NOTE — Assessment & Plan Note (Addendum)
Symptoms of acute sinusitis did not resolve with augmentin and she now has an uncontrolled purulent cough despite use of benzonatate.  Chest x ray ordered  Will treat for CAP with levaquin/doxy,  Prednisone taper,  And tussionex for cough suppression.  She is aware of the risk for overdose if she combines with hydrocodone tablets,  Which she has not taken in several days, and with Lorrin Mais, which she has agreed to suspend if she is using tussionex at night

## 2021-12-19 NOTE — Progress Notes (Signed)
Virtual Visit via [place]   Note    This format is felt to be most appropriate for this patient at this time.  All issues noted in this document were discussed and addressed.  No physical exam was performed (except for noted visual exam findings with Video Visits).   I connected with Adrienne Robinson on 12/19/21 at  4:30 PM EDT by a video enabled telemedicine application  and verified that I am speaking with the correct person using two identifiers. Location patient: home Location provider: work or home office Persons participating in the virtual visit: patient, provider  I discussed the limitations, risks, security and privacy concerns of performing an evaluation and management service by telephone and the availability of in person appointments. I also discussed with the patient that there may be a patient responsible charge related to this service. The patient expressed understanding and agreed to proceed.   Reason for visit: cough   HPI:  66 yr old female presents with worsening cough productive  of green sputum . Symptoms have been present since Oct 20 when respiratory symptoms started with body aches sinusitis and thrush .  Home COVID was negative.  She was treated with augmentin,  fluconazole and tessalon perles.  Cough has persisted and she has not been able to sleep at night.  She has missed 3 days of work due to feeling so lousy  she denies pleurisy and dyspnea, but notes that the cough is uncontrolled.   She has chronic back pain managed with hydrocodone, but has not taken any in several days because she received an epidural steroid injection that has transiently relieved her pain .   She is also [place] dependent but has not had any in several days either.    ROS: See pertinent positives and negatives per HPI.  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   Prepatellar effusion of right knee 06/09/2019   Aspiration done June 13, 2019   Scoliosis    Spinal stenosis of lumbar region    Squamous cell carcinoma of skin 06/24/2018   R lateral calf   Tubular adenoma of colon     Past Surgical History:  Procedure Laterality Date   ABDOMINAL HYSTERECTOMY     APPENDECTOMY     CARDIAC CATHETERIZATION     normal coronaries, no wall motion abnormalities 11/02/09 [facility])   CATARACT EXTRACTION W/PHACO Left 11/24/2020   Procedure: CATARACT EXTRACTION PHACO AND INTRAOCULAR LENS PLACEMENT ([place]) LEFT 6.09 00:46.7;  Surgeon: [name], MD;  Location: [place];  Service: Ophthalmology;  Laterality: Left;  patient wants early   CATARACT EXTRACTION W/PHACO Right 12/08/2020   Procedure: CATARACT EXTRACTION PHACO AND INTRAOCULAR LENS PLACEMENT ([facility]) RIGHT 3.92 00:49.0;  Surgeon: [name], MD;  Location: [place];  Service: Ophthalmology;  Laterality: Right;  patient request early   NASAL SINUS SURGERY     OOPHORECTOMY     RADIOLOGY WITH  ANESTHESIA  02/26/2012   Procedure: RADIOLOGY WITH ANESTHESIA;  Surgeon: [name], MD;  Location: [place];  Service: Radiology;  Laterality: N/A;   REVERSE SHOULDER ARTHROPLASTY Right 06/27/2013   Procedure: REVERSE RIGHT TOTAL SHOULDER ARTHROPLASTY;  Surgeon: [name], MD;  Location: [place];  Service: Orthopedics;  Laterality: Right;   SHOULDER ARTHROSCOPY WITH ROTATOR CUFF REPAIR AND SUBACROMIAL DECOMPRESSION  2007   left   TONSILLECTOMY  adnoids    Family History  Problem Relation Age of Onset   Thyroid disease Mother    Colon  cancer Father 67   Diabetes Father    Heart disease Father    Rectal cancer Father    Breast cancer Paternal Grandmother    Colon cancer Paternal Grandfather    Colon cancer Other        pggm   Stomach cancer Neg Hx    Esophageal cancer Neg Hx     SOCIAL HX:  reports that she has never smoked. She has never used smokeless tobacco. She reports current alcohol use of about 2.0 standard drinks of alcohol per week. She reports that she does not use drugs.    Current Outpatient Medications:    amLODipine (NORVASC) 10 MG tablet, TAKE 1 TABLET BY MOUTH EVERY DAY, Disp: 90 tablet, Rfl: 3   benzonatate (TESSALON) 200 MG capsule, Take 1 capsule (200 mg total) by mouth 3 (three) times daily as needed for cough., Disp: 60 capsule, Rfl: 1   buPROPion (WELLBUTRIN XL) 150 MG 24 hr tablet, TAKE 1 TABLET BY MOUTH EVERY DAY IN THE MORNING, Disp: 90 tablet, Rfl: 1   chlorpheniramine-HYDROcodone (TUSSIONEX) 10-8 MG/5ML, Take 5 mLs by mouth every 12 (twelve) hours as needed for cough., Disp: 140 mL, Rfl: 0   cyanocobalamin (,VITAMIN B-12,) 1000 MCG/ML injection, INJECT 1 ML (1,000 MCG TOTAL) INTO THE MUSCLE ONCE A WEEK. (Patient taking differently: Inject 1,000 mcg into the muscle every 30 (thirty) days.), Disp: 12 mL, Rfl: 6   doxycycline (VIBRA-TABS) 100 MG tablet, Take 1 tablet (100 mg total) by mouth 2 (two) times daily for 7 days., Disp: 14 tablet, Rfl: 0   esomeprazole (NEXIUM) 20 MG capsule, Take 1 capsule (20 mg total) by mouth daily. **PLEASE CONTACT OFFICE TO SCHEDULE FOLLOW UP, Disp: 90 capsule, Rfl: 0   fluticasone (FLONASE) 50 MCG/ACT nasal spray, USE 2 SPRAYS IN EACH NOSTRIL DAILY AS NEEDED, Disp: 16 g, Rfl: 1   furosemide (LASIX) 20 MG tablet, TAKE 1 TABLET (20 MG TOTAL) BY MOUTH DAILY WITH BREAKFAST AS NEEDED FOR EDEMA, Disp: 90 tablet, Rfl: 1   levofloxacin (LEVAQUIN) 500 MG tablet, Take 1 tablet (500 mg total) by mouth daily for 7 days., Disp: 7 tablet, Rfl: 0   LINZESS 145 MCG CAPS capsule,  TAKE 1 CAPSULE BY MOUTH EVERY DAY BEFORE BREAKFAST, Disp: 90 capsule, Rfl: 0   metoprolol succinate (TOPROL-XL) 25 MG 24 hr tablet, TAKE 1 TABLET BY MOUTH EVERY DAY, Disp: 90 tablet, Rfl: 1   Multiple Vitamin (MULTIVITAMIN) tablet, Take 1 tablet by mouth daily., Disp: , Rfl:    predniSONE (DELTASONE) 10 MG tablet, 6 tablets on Day 1 , then reduce by 1 tablet daily until gone, Disp: 21 tablet, Rfl: 0   propranolol (INDERAL) 10 MG tablet, TAKE 1/2 TABLET BY MOUTH AS NEEDED FOR ANXIETY, Disp: 15 tablet, Rfl: 1   sodium chloride 1 g tablet, Take 1 tablet (1 g total) by mouth 2 (two) times daily with a meal., Disp: 60 tablet,  Rfl: 2   Syringe/Needle, Disp, (SYRINGE 3CC/25GX1") 25G X 1" 3 ML MISC, Use for b12 injections, Disp: 50 each, Rfl: 0   zolpidem (AMBIEN CR) 12.5 MG CR tablet, TAKE 1 TABLET BY MOUTH AT BEDTIME AS NEEDED FOR SLEEP., Disp: 30 tablet, Rfl: 5  EXAM:  VITALS per patient if applicable:  GENERAL: alert, oriented, appears well and in no acute distress  HEENT: atraumatic, conjunttiva clear, no obvious abnormalities on inspection of external nose and ears  NECK: normal movements of the head and neck  LUNGS: on inspection no signs of respiratory distress, breathing rate appears normal, no obvious gross SOB, gasping or wheezing  CV: no obvious cyanosis  MS: moves all visible extremities without noticeable abnormality  PSYCH/NEURO: pleasant and cooperative, no obvious depression or anxiety, speech and thought processing grossly intact  ASSESSMENT AND PLAN:  Discussed the following assessment and plan:  Cough productive of purulent sputum - Plan: DG Chest 2 View  Community acquired pneumonia, unspecified laterality  Community acquired pneumonia Symptoms of acute sinusitis did not resolve with augmentin and she now has an uncontrolled purulent cough despite use of benzonatate.  Chest x ray ordered  Will treat for CAP with levaquin/doxy,  Prednisone taper,  And tussionex for  cough suppression.  She is aware of the risk for overdose if she combines with hydrocodone tablets,  Which she has not taken in several days, and with Lorrin Mais, which she has agreed to suspend if she is using tussionex at night     I discussed the assessment and treatment plan with the patient. The patient was provided an opportunity to ask questions and all were answered. The patient agreed with the plan and demonstrated an understanding of the instructions.   The patient was advised to call back or seek an in-person evaluation if the symptoms worsen or if the condition fails to improve as anticipated.   I spent 20 minutes dedicated to the care of this patient on the date of this encounter to include pre-visit review of her medical history,  Face-to-face time with the patient , and post visit ordering of testing and therapeutics.    [name], MD

## 2021-12-22 ENCOUNTER — Other Ambulatory Visit: Payer: Managed Care, Other (non HMO)

## 2021-12-22 ENCOUNTER — Ambulatory Visit: Payer: Managed Care, Other (non HMO)

## 2021-12-22 DIAGNOSIS — R058 Other specified cough: Secondary | ICD-10-CM

## 2021-12-23 ENCOUNTER — Ambulatory Visit
Admission: RE | Admit: 2021-12-23 | Discharge: 2021-12-23 | Disposition: A | Discharge status: Home / Self Care | Payer: Managed Care, Other (non HMO) | Source: Ambulatory Visit | Attending: Internal Medicine | Admitting: Internal Medicine

## 2021-12-23 DIAGNOSIS — Z780 Asymptomatic menopausal state: Secondary | ICD-10-CM

## 2021-12-25 ENCOUNTER — Other Ambulatory Visit: Payer: Self-pay | Admitting: Internal Medicine

## 2021-12-25 MED ORDER — FLUCONAZOLE 150 MG PO TABS: 150.0000 mg | ORAL_TABLET | Freq: Every day | ORAL | 0 refills | Status: DC

## 2021-12-25 MED ORDER — HYDROCODONE-ACETAMINOPHEN 5-325 MG PO TABS: 2.0000 | ORAL_TABLET | Freq: Four times a day (QID) | ORAL | 0 refills | Status: DC | PRN

## 2021-12-29 NOTE — Telephone Encounter (Signed)
Does not look pt has had any pneumonia vaccines. Would you like for her to get the Prevnar 20?

## 2021-12-30 MED ORDER — NYSTATIN 100000 UNIT/ML MT SUSP: 5.0000 mL | Freq: Four times a day (QID) | OROMUCOSAL | 0 refills | Status: DC

## 2021-12-30 NOTE — Addendum Note (Signed)
Addended by: [name] on: 12/30/2021 12:56 AM   Modules accepted: Orders

## 2022-01-03 NOTE — Progress Notes (Deleted)
[place] Phone: [phone] Subjective:    I'm seeing this patient by the request  of:  [name], MD  CC: Right knee pain  WER:[id number]  Adrienne Robinson is a 66 y.o. female coming in with complaint of R knee pain. Last seen for back and thumb pain in September. 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   Prepatellar effusion of right knee 06/09/2019   Aspiration done June 13, 2019   Scoliosis    Spinal stenosis of lumbar region    Squamous cell carcinoma of skin 06/24/2018   R lateral calf   Tubular adenoma of colon    Past Surgical History:  Procedure Laterality Date   ABDOMINAL HYSTERECTOMY     APPENDECTOMY     CARDIAC CATHETERIZATION     normal coronaries, no wall motion abnormalities 11/02/09 [facility])   CATARACT EXTRACTION W/PHACO Left 11/24/2020   Procedure: CATARACT EXTRACTION PHACO AND INTRAOCULAR LENS PLACEMENT ([place]) LEFT 6.09 00:46.7;  Surgeon: [name], MD;  Location: [place];  Service: Ophthalmology;  Laterality: Left;  patient wants early   CATARACT EXTRACTION W/PHACO Right 12/08/2020   Procedure: CATARACT EXTRACTION PHACO AND INTRAOCULAR LENS PLACEMENT ([facility]) RIGHT 3.92 00:49.0;  Surgeon: [name], MD;  Location: [place];  Service:  Ophthalmology;  Laterality: Right;  patient request early   NASAL SINUS SURGERY     OOPHORECTOMY     RADIOLOGY WITH ANESTHESIA  02/26/2012   Procedure: RADIOLOGY WITH ANESTHESIA;  Surgeon: [name], MD;  Location: [place];  Service: Radiology;  Laterality: N/A;   REVERSE SHOULDER ARTHROPLASTY Right 06/27/2013   Procedure: REVERSE RIGHT TOTAL SHOULDER ARTHROPLASTY;  Surgeon: [name], MD;  Location: [place];  Service: Orthopedics;  Laterality: Right;   SHOULDER ARTHROSCOPY WITH ROTATOR CUFF REPAIR AND SUBACROMIAL DECOMPRESSION  2007   left   TONSILLECTOMY  adnoids   Social History   Socioeconomic History   Marital status: Married    Spouse name: Not on file   Number of children: 0   Years of education: 18   Highest education level: Not on file  Occupational History   Occupation: Nurse executive   Tobacco Use   Smoking status: Never   Smokeless tobacco: Never  Vaping Use   Vaping Use: Never used  Substance and Sexual Activity   Alcohol use: Yes    Alcohol/week: 2.0 standard drinks of alcohol    Types: 2 Glasses of wine per week    Comment: 2-3 week with dinner   Drug use: No   Sexual activity: Not on file  Other Topics Concern   Not on file  Social History Narrative   Lives at home with her husband.   Right-handed.  2-3 diet cokes per day.   Social Determinants of Health   Financial Resource Strain: Not on file  Food Insecurity: Not on file  Transportation Needs: Not on file  Physical Activity: Not on file  Stress: Not on file  Social Connections: Not on file   Allergies  Allergen Reactions   Buspirone Other (See Comments)    Dizziness,  dry mouth    Celebrex [Celecoxib] Swelling   Adhesive [Tape] Rash and Other (See Comments)    Redness, blisters, skin peeling off.   Morphine And Related Rash   Family History  Problem Relation Age of Onset   Thyroid disease Mother    Colon cancer Father 29   Diabetes Father    Heart disease Father    Rectal  cancer Father    Breast cancer Paternal Grandmother    Colon cancer Paternal Grandfather    Colon cancer Other        pggm   Stomach cancer Neg Hx    Esophageal cancer Neg Hx     Current Outpatient Medications (Endocrine & Metabolic):    predniSONE (DELTASONE) 10 MG tablet, 6 tablets on Day 1 , then reduce by 1 tablet daily until gone  Current Outpatient Medications (Cardiovascular):    amLODipine (NORVASC) 10 MG tablet, TAKE 1 TABLET BY MOUTH EVERY DAY   furosemide (LASIX) 20 MG tablet, TAKE 1 TABLET (20 MG TOTAL) BY MOUTH DAILY WITH BREAKFAST AS NEEDED FOR EDEMA   metoprolol succinate (TOPROL-XL) 25 MG 24 hr tablet, TAKE 1 TABLET BY MOUTH EVERY DAY   propranolol (INDERAL) 10 MG tablet, TAKE 1/2 TABLET BY MOUTH AS NEEDED FOR ANXIETY  Current Outpatient Medications (Respiratory):    benzonatate (TESSALON) 200 MG capsule, Take 1 capsule (200 mg total) by mouth 3 (three) times daily as needed for cough.   chlorpheniramine-HYDROcodone (TUSSIONEX) 10-8 MG/5ML, Take 5 mLs by mouth every 12 (twelve) hours as needed for cough.   fluticasone (FLONASE) 50 MCG/ACT nasal spray, USE 2 SPRAYS IN EACH NOSTRIL DAILY AS NEEDED  Current Outpatient Medications (Analgesics):    HYDROcodone-acetaminophen (NORCO/VICODIN) 5-325 MG tablet, Take 2 tablets by mouth every 6 (six) hours as needed for moderate pain.  Current Outpatient Medications (Hematological):    cyanocobalamin (,VITAMIN B-12,) 1000 MCG/ML injection, INJECT 1 ML (1,000 MCG TOTAL) INTO THE MUSCLE ONCE A WEEK. (Patient taking differently: Inject 1,000 mcg into the muscle every 30 (thirty) days.)  Current Outpatient Medications (Other):    buPROPion (WELLBUTRIN XL) 150 MG 24 hr tablet, TAKE 1 TABLET BY MOUTH EVERY DAY IN THE MORNING   esomeprazole (NEXIUM) 20 MG capsule, TAKE 1 CAPSULE (20 MG TOTAL) BY MOUTH DAILY. **PLEASE CONTACT OFFICE TO SCHEDULE FOLLOW UP   fluconazole (DIFLUCAN) 150 MG tablet, Take 1 tablet (150 mg total) by mouth  daily.   LINZESS 145 MCG CAPS capsule, TAKE 1 CAPSULE BY MOUTH EVERY DAY BEFORE BREAKFAST   Multiple Vitamin (MULTIVITAMIN) tablet, Take 1 tablet by mouth daily.   nystatin (MYCOSTATIN) 100000 UNIT/ML suspension, Take 5 mLs (500,000 Units total) by mouth 4 (four) times daily.   sodium chloride 1 g tablet, Take 1 tablet (1 g total) by mouth 2 (two) times daily with a meal.   Syringe/Needle, Disp, (SYRINGE 3CC/25GX1") 25G X 1" 3 ML MISC, Use for b12 injections   zolpidem (AMBIEN CR) 12.5 MG CR tablet, TAKE 1 TABLET BY MOUTH AT BEDTIME AS NEEDED FOR SLEEP.   Reviewed prior external information including notes and imaging from  primary care provider  As well as notes that were available from care everywhere and other healthcare systems.  Patient has been seen by primary care and has had difficulty with cough as well has oral thrush and anemia recently.  Past medical history, social, surgical and family history all reviewed in electronic medical record.  No pertanent information unless stated regarding to the chief complaint.   Review of Systems:  No headache, visual changes, nausea, vomiting, diarrhea, constipation, dizziness, abdominal pain, skin rash, fevers, chills, night sweats, weight loss, swollen lymph nodes, body aches, joint swelling, chest pain, shortness of breath, mood changes. POSITIVE muscle aches  Objective  There were no vitals taken for this visit.   General: No apparent distress alert and oriented x3 mood and affect normal, dressed appropriately.  HEENT: Pupils equal, extraocular movements intact  Respiratory: Patient's speak in full sentences and does not appear short of breath  Cardiovascular: No lower extremity edema, non tender, no erythema  Right knee exam shows  Limited muscular skeletal ultrasound was performed and interpreted by Hulan Saas, M      Impression and Recommendations:    The above documentation has been reviewed and is accurate and complete Lyndal Pulley, DO

## 2022-01-04 ENCOUNTER — Ambulatory Visit: Payer: Managed Care, Other (non HMO) | Admitting: Family Medicine

## 2022-01-11 ENCOUNTER — Other Ambulatory Visit: Payer: Self-pay | Admitting: Internal Medicine

## 2022-01-11 NOTE — Progress Notes (Signed)
[place] Phone: [phone] Subjective:   Adrienne Robinson, am serving as a scribe for Dr. Hulan Saas.  I'm seeing this patient by the request  of:  [name], MD  CC: Right knee pain  WGN:[id number]  11/10/2021 Patient did respond well to the injection previously but is having worsening pain again.  Another epidural could be beneficial.  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  Attempted trigger point injections on the lower back today.  We will see if that will be helpful but I think that this is the underlying problem is more of the spine itself.  Follow-up again in 6 weeks after that      Repeat injection given again today.  Patient unfortunately has to take care of her husband who is hopefully getting a kidney transplant in the near future.  Patient has been doing a lot more manual work to be contributing to some of the pain as well.  Patient would be unable to get any type of surgical intervention at this moment. F/u 3 months    Update 01/18/2022 Adrienne Robinson is a 66 y.o. female coming in with complaint of L thumb and lumbar spine pain. Epidural 11/22/2021.  Also complaining of right knee pain.  Patient states that her back pain has been increasing. Pain in lumbar spine that radiates into the thoracic spine. Trigger point injections last visit helped more than any injection. Epidural only gave her a small amount of relief. Radiologist recommends facet injections for L spine since C spine worked well.   Also would like injection in L thumb.   C/o pain in L knee pain after being in her garden. Knee is stiff.       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   Prepatellar effusion of right knee 06/09/2019   Aspiration done June 13, 2019   Scoliosis    Spinal stenosis of lumbar region    Squamous cell carcinoma of skin 06/24/2018   R lateral calf   Tubular adenoma of colon    Past Surgical History:  Procedure Laterality Date   ABDOMINAL HYSTERECTOMY     APPENDECTOMY     CARDIAC CATHETERIZATION     normal coronaries, Robinson wall motion abnormalities 11/02/09 [facility])   CATARACT EXTRACTION W/PHACO Left 11/24/2020   Procedure: CATARACT EXTRACTION PHACO AND INTRAOCULAR LENS PLACEMENT ([place]) LEFT 6.09 00:46.7;  Surgeon: [name], MD;  Location: [place];  Service: Ophthalmology;  Laterality: Left;  patient wants early   CATARACT EXTRACTION W/PHACO Right 12/08/2020   Procedure: CATARACT EXTRACTION PHACO AND INTRAOCULAR LENS PLACEMENT ([facility]) RIGHT 3.92 00:49.0;  Surgeon: [name], MD;  Location: [place];  Service: Ophthalmology;  Laterality: Right;  patient request early  NASAL SINUS SURGERY     OOPHORECTOMY     RADIOLOGY WITH ANESTHESIA  02/26/2012   Procedure: RADIOLOGY WITH ANESTHESIA;  Surgeon: [name], MD;  Location: [place];  Service: Radiology;  Laterality: N/A;   REVERSE SHOULDER ARTHROPLASTY Right 06/27/2013   Procedure: REVERSE RIGHT TOTAL SHOULDER ARTHROPLASTY;  Surgeon: [name], MD;  Location: [place];  Service: Orthopedics;  Laterality: Right;   SHOULDER ARTHROSCOPY WITH ROTATOR CUFF REPAIR AND SUBACROMIAL DECOMPRESSION  2007   left   TONSILLECTOMY  adnoids   Social History   Socioeconomic History   Marital status: Married    Spouse name: Not on file    Number of children: 0   Years of education: 18   Highest education level: Not on file  Occupational History   Occupation: Nurse executive   Tobacco Use   Smoking status: Never   Smokeless tobacco: Never  Vaping Use   Vaping Use: Never used  Substance and Sexual Activity   Alcohol use: Yes    Alcohol/week: 2.0 standard drinks of alcohol    Types: 2 Glasses of wine per week    Comment: 2-3 week with dinner   Drug use: Robinson   Sexual activity: Not on file  Other Topics Concern   Not on file  Social History Narrative   Lives at home with her husband.   Right-handed.   2-3 diet cokes per day.   Social Determinants of Health   Financial Resource Strain: Not on file  Food Insecurity: Not on file  Transportation Needs: Not on file  Physical Activity: Not on file  Stress: Not on file  Social Connections: Not on file   Allergies  Allergen Reactions   Buspirone Other (See Comments)    Dizziness,  dry mouth    Celebrex [Celecoxib] Swelling   Adhesive [Tape] Rash and Other (See Comments)    Redness, blisters, skin peeling off.   Morphine And Related Rash   Family History  Problem Relation Age of Onset   Thyroid disease Mother    Colon cancer Father 61   Diabetes Father    Heart disease Father    Rectal cancer Father    Breast cancer Paternal Grandmother    Colon cancer Paternal Grandfather    Colon cancer Other        pggm   Stomach cancer Neg Hx    Esophageal cancer Neg Hx      Current Outpatient Medications (Cardiovascular):    amLODipine (NORVASC) 10 MG tablet, TAKE 1 TABLET BY MOUTH EVERY DAY   furosemide (LASIX) 20 MG tablet, TAKE 1 TABLET (20 MG TOTAL) BY MOUTH DAILY WITH BREAKFAST AS NEEDED FOR EDEMA   metoprolol succinate (TOPROL-XL) 25 MG 24 hr tablet, TAKE 1 TABLET BY MOUTH EVERY DAY   propranolol (INDERAL) 10 MG tablet, TAKE 1/2 TABLET BY MOUTH AS NEEDED FOR ANXIETY  Current Outpatient Medications (Respiratory):    fluticasone (FLONASE) 50 MCG/ACT nasal  spray, USE 2 SPRAYS IN EACH NOSTRIL DAILY AS NEEDED  Current Outpatient Medications (Analgesics):    HYDROcodone-acetaminophen (NORCO) 10-325 MG tablet, Take 1 tablet by mouth every 6 (six) hours as needed.  Current Outpatient Medications (Hematological):    cyanocobalamin (,VITAMIN B-12,) 1000 MCG/ML injection, INJECT 1 ML (1,000 MCG TOTAL) INTO THE MUSCLE ONCE A WEEK. (Patient taking differently: Inject 1,000 mcg into the muscle every 30 (thirty) days.)  Current Outpatient Medications (Other):    buPROPion (WELLBUTRIN XL) 150 MG 24 hr tablet, TAKE 1 TABLET BY  MOUTH EVERY DAY IN THE MORNING   esomeprazole (NEXIUM) 20 MG capsule, TAKE 1 CAPSULE (20 MG TOTAL) BY MOUTH DAILY. **PLEASE CONTACT OFFICE TO SCHEDULE FOLLOW UP   LINZESS 145 MCG CAPS capsule, TAKE 1 CAPSULE BY MOUTH EVERY DAY BEFORE BREAKFAST   Multiple Vitamin (MULTIVITAMIN) tablet, Take 1 tablet by mouth daily.   sodium chloride 1 g tablet, Take 1 tablet (1 g total) by mouth 2 (two) times daily with a meal.   Syringe/Needle, Disp, (SYRINGE 3CC/25GX1") 25G X 1" 3 ML MISC, Use for b12 injections   zolpidem (AMBIEN CR) 12.5 MG CR tablet, TAKE 1 TABLET BY MOUTH AT BEDTIME AS NEEDED FOR SLEEP.   Reviewed prior external information including notes and imaging from  primary care provider As well as notes that were available from care everywhere and other healthcare systems.  Past medical history, social, surgical and family history all reviewed in electronic medical record.  Robinson pertanent information unless stated regarding to the chief complaint.   Review of Systems:  Robinson headache, visual changes, nausea, vomiting, diarrhea, constipation, dizziness, abdominal pain, skin rash, fevers, chills, night sweats, weight loss, swollen lymph nodes,  chest pain, shortness of breath, mood changes. POSITIVE muscle aches, body aches and joint swelling   Objective  Blood pressure 122/84, pulse 60, height '5\' 6"'$  (1.676 m), weight 113 lb (51.3 kg), SpO2  98 %.   General: Robinson apparent distress alert and oriented x3 mood and affect normal, dressed appropriately.  HEENT: Pupils equal, extraocular movements intact  Respiratory: Patient's speak in full sentences and does not appear short of breath  Cardiovascular: Robinson lower extremity edema, non tender, Robinson erythema  Low back exam does have some degenerative scoliosis noted.  Some tenderness to palpation in the paraspinal musculature with multiple areas of tenderness.  Multiple trigger points noted in the paraspinal musculature of the lumbar spine right greater than left. Tightness with FABER test right greater than left.  Left CMC joint has significant arthritic changes noted.  Positive grind test noted.  Some thenar eminence wasting  Right knee does have some lateral tracking of the patella noted.  Positive crepitus.  Lacks last 5 degrees of extension and 5 degrees of flexion.  Limited muscular skeletal ultrasound was performed and interpreted by Hulan Saas, M  Limited ultrasound of patient's right knee shows that patient does have hypoechoic changes with what appears to be some hypoechoic previous bodies noted narrowing of the patellofemoral joint noted as well.  Degenerative changes of the meniscus noted Impression: Trace effusion noted of the patellofemoral with loose bodies.  After informed written and verbal consent, patient was seated on exam table. Right knee was prepped with alcohol swab and utilizing anterolateral approach, patient's right knee space was injected with 4:1  marcaine 0.5%: Kenalog '40mg'$ /dL. Patient tolerated the procedure well without immediate complications.  Procedure: Real-time Ultrasound Guided Injection of left CMC joint Device: GE Logiq Q7 Ultrasound guided injection is preferred based studies that show increased duration, increased effect, greater accuracy, decreased procedural pain, increased response rate, and decreased cost with ultrasound guided versus blind  injection.  Verbal informed consent obtained.  Time-out conducted.  Noted Robinson overlying erythema, induration, or other signs of local infection.  Skin prepped in a sterile fashion.  Local anesthesia: Topical Ethyl chloride.  With sterile technique and under real time ultrasound guidance: With a 25-gauge half inch needle injected with 0.5 cc of 0.5% Marcaine and 0.5 cc of Kenalog 40 mg/mL. Completed without difficulty  Pain immediately  resolved suggesting accurate placement of the medication.  Advised to call if fevers/chills, erythema, induration, drainage, or persistent bleeding.  Impression: Technically successful ultrasound guided injection.  After verbal consent patient was prepped with alcohol swab and with a 25-gauge 1 inch needle injected with 0.5 cc of 0.5% Marcaine and 3 cc of Kenalog 40 mg/mL and multiple trigger points in the lumbar spine.  Robinson blood loss.  Postinjection instructions given.   Impression and Recommendations:    The above documentation has been reviewed and is accurate and complete Lyndal Pulley, DO

## 2022-01-16 ENCOUNTER — Inpatient Hospital Stay: Payer: Managed Care, Other (non HMO)

## 2022-01-16 ENCOUNTER — Telehealth: Payer: Self-pay

## 2022-01-16 ENCOUNTER — Encounter: Payer: Self-pay | Admitting: Oncology

## 2022-01-16 ENCOUNTER — Inpatient Hospital Stay: Payer: Managed Care, Other (non HMO) | Attending: Oncology | Admitting: Oncology

## 2022-01-16 VITALS — BP 130/71 | HR 66 | Temp 96.3°F | Wt 112.6 lb

## 2022-01-16 DIAGNOSIS — D649 Anemia, unspecified: Secondary | ICD-10-CM | POA: Diagnosis not present

## 2022-01-16 DIAGNOSIS — Z85828 Personal history of other malignant neoplasm of skin: Secondary | ICD-10-CM | POA: Insufficient documentation

## 2022-01-16 DIAGNOSIS — E538 Deficiency of other specified B group vitamins: Secondary | ICD-10-CM | POA: Insufficient documentation

## 2022-01-16 LAB — CBC WITH DIFFERENTIAL/PLATELET
Abs Immature Granulocytes: 0.01 10*3/uL (ref 0.00–0.07)
Basophils Absolute: 0 10*3/uL (ref 0.0–0.1)
Basophils Relative: 1 %
Eosinophils Absolute: 0.3 10*3/uL (ref 0.0–0.5)
Eosinophils Relative: 6 %
HCT: 33.5 % — ABNORMAL LOW (ref 36.0–46.0)
Hemoglobin: 11.7 g/dL — ABNORMAL LOW (ref 12.0–15.0)
Immature Granulocytes: 0 %
Lymphocytes Relative: 31 %
Lymphs Abs: 1.4 10*3/uL (ref 0.7–4.0)
MCH: 33.6 pg (ref 26.0–34.0)
MCHC: 34.9 g/dL (ref 30.0–36.0)
MCV: 96.3 fL (ref 80.0–100.0)
Monocytes Absolute: 0.5 10*3/uL (ref 0.1–1.0)
Monocytes Relative: 12 %
Neutro Abs: 2.2 10*3/uL (ref 1.7–7.7)
Neutrophils Relative %: 50 %
Platelets: 387 10*3/uL (ref 150–400)
RBC: 3.48 MIL/uL — ABNORMAL LOW (ref 3.87–5.11)
RDW: 12.8 % (ref 11.5–15.5)
WBC: 4.4 10*3/uL (ref 4.0–10.5)
nRBC: 0 % (ref 0.0–0.2)

## 2022-01-16 LAB — IRON AND TIBC
Iron: 127 ug/dL (ref 28–170)
Saturation Ratios: 35 % — ABNORMAL HIGH (ref 10.4–31.8)
TIBC: 367 ug/dL (ref 250–450)
UIBC: 240 ug/dL

## 2022-01-16 LAB — LACTATE DEHYDROGENASE: LDH: 146 U/L (ref 98–192)

## 2022-01-16 LAB — TSH: TSH: 0.979 u[IU]/mL (ref 0.350–4.500)

## 2022-01-16 LAB — TECHNOLOGIST SMEAR REVIEW: Plt Morphology: NORMAL

## 2022-01-16 LAB — RETIC PANEL
Immature Retic Fract: 12 % (ref 2.3–15.9)
RBC.: 3.56 MIL/uL — ABNORMAL LOW (ref 3.87–5.11)
Retic Count, Absolute: 68.4 10*3/uL (ref 19.0–186.0)
Retic Ct Pct: 1.9 % (ref 0.4–3.1)
Reticulocyte Hemoglobin: 37.6 pg (ref >27.9)

## 2022-01-16 LAB — FERRITIN: Ferritin: 83 ng/mL (ref 11–307)

## 2022-01-16 NOTE — Progress Notes (Signed)
Patient is referred by Dr. Derrel Nip for anemia. Patient states that she has been more fatigued over the last couple of months, but otherwise she has felt pretty normal for her.

## 2022-01-16 NOTE — Assessment & Plan Note (Addendum)
Chronic anemia, with fluctuating levels. Check CBC, smear, reticulocyte panel, peripheral blood flow cytometry, LDH, haptoglobin, iron TIBC ferritin, ANA, TSH, CMV, EBV, parvovirus Her previous workup was negative for iron deficiency, folate deficiency, B12 deficiency.  We discussed about the possibility of bone marrow biopsy for further evaluation.  Patient would like to proceed with bone marrow for further evaluation.

## 2022-01-16 NOTE — Telephone Encounter (Signed)
Faxed order to [facility] to schedule Bone Marrow Bx. Dr. Tasia Catchings wants to see patient 1 week after Bone Marrow. Will sent to [facility] scheduling to schedule once we have date for Bx.

## 2022-01-16 NOTE — Progress Notes (Signed)
Hematology/Oncology Consult note [phone] [phone]      Patient Care Team: [name], MD as PCP - General (Internal Medicine)   REFERRING PROVIDER: [name], MD  CHIEF COMPLAINTS/REASON FOR VISIT:  Anemia  ASSESSMENT & PLAN:  Normocytic anemia Chronic anemia, with fluctuating levels. Check CBC, smear, reticulocyte panel, peripheral blood flow cytometry, LDH, haptoglobin, iron TIBC ferritin, ANA, TSH, CMV, EBV, parvovirus Her previous workup was negative for iron deficiency, folate deficiency, B12 deficiency.  We discussed about the possibility of bone marrow biopsy for further evaluation.  Patient would like to proceed with bone marrow for further evaluation.  Orders Placed This Encounter  Procedures   Ferritin    Standing Status:   Future    Number of Occurrences:   1    Standing Expiration Date:   07/17/2022   Iron and TIBC    Standing Status:   Future    Number of Occurrences:   1    Standing Expiration Date:   01/17/2023   Retic Panel    Standing Status:   Future    Number of Occurrences:   1    Standing Expiration Date:   01/17/2023   CBC with Differential/Platelet    Standing Status:   Future    Number of Occurrences:   1    Standing Expiration Date:   01/17/2023   Flow cytometry panel-leukemia/lymphoma work-up    Standing Status:   Future    Number of Occurrences:   1    Standing Expiration Date:   01/17/2023   Lactate dehydrogenase    Standing Status:   Future    Number of Occurrences:   1    Standing Expiration Date:   01/17/2023   Haptoglobin    Standing Status:   Future    Number of Occurrences:   1    Standing Expiration Date:   01/17/2023   Human parvovirus DNA detection by PCR    Standing Status:   Future    Number of Occurrences:   1    Standing Expiration Date:   01/17/2023   CMV DNA, quantitative, PCR    Standing Status:   Future    Number of Occurrences:   1    Standing Expiration Date:   01/17/2023    Epstein barr vrs(ebv dna by pcr)    Standing Status:   Future    Number of Occurrences:   1    Standing Expiration Date:   01/17/2023   ANA, IFA (with reflex)    Standing Status:   Future    Number of Occurrences:   1    Standing Expiration Date:   01/17/2023   Technologist smear review    Standing Status:   Future    Number of Occurrences:   1    Standing Expiration Date:   01/17/2023    Order Specific Question:   Clinical information:    Answer:   anemia   TSH    Standing Status:   Future    Number of Occurrences:   1    Standing Expiration Date:   01/17/2023   Follow-up 1 week after bone marrow biopsy to go over results. All questions were answered. The patient knows to call the clinic with any problems, questions or concerns.  [name], MD, PhD [facility] Hematology Oncology 01/16/2022     HISTORY OF PRESENTING ILLNESS:  Adrienne Robinson is a  66 y.o.  female with PMH listed below  who was referred to me for anemia Reviewed patient's recent labs that was done.  Patient has longstanding history of anemia, since at least 2012.  Her counts ranges from 10s -11s. Patient reports feeling tired and fatigued.  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 She denies recent chest pain on exertion, shortness of breath on minimal exertion, pre-syncopal episodes, or palpitations She had not noticed any recent bleeding such as hematuria or hematochezia.   Patient's previous workup includes normal iron panel, negative M protein on protein electrophoresis, random urine showed no monoclonal protein normal erythropoietin level,Normal folate and B12 level, Patient denies any occupational toxin exposure.  Patient is on vitamin B12 injection weekly, managed by primary care provider.  She denies any routine alcohol consumption history.  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)   CATARACT EXTRACTION W/PHACO Left 11/24/2020   Procedure: CATARACT EXTRACTION PHACO AND INTRAOCULAR LENS PLACEMENT ([place]) LEFT 6.09 00:46.7;  Surgeon: [name], MD;  Location: [place];  Service: Ophthalmology;  Laterality: Left;  patient wants early   CATARACT EXTRACTION W/PHACO Right 12/08/2020   Procedure: CATARACT EXTRACTION PHACO AND INTRAOCULAR LENS PLACEMENT ([facility]) RIGHT 3.92 00:49.0;  Surgeon: [name], MD;  Location: [place];  Service: Ophthalmology;  Laterality: Right;  patient request early   NASAL SINUS SURGERY     OOPHORECTOMY     RADIOLOGY WITH ANESTHESIA  02/26/2012   Procedure: RADIOLOGY WITH ANESTHESIA;  Surgeon: [name], MD;  Location: [place];  Service: Radiology;  Laterality: N/A;   REVERSE SHOULDER ARTHROPLASTY Right 06/27/2013   Procedure: REVERSE RIGHT TOTAL SHOULDER ARTHROPLASTY;  Surgeon: [name], MD;  Location: [place];  Service: Orthopedics;  Laterality: Right;   SHOULDER ARTHROSCOPY WITH ROTATOR CUFF REPAIR AND SUBACROMIAL DECOMPRESSION  2007   left    TONSILLECTOMY  adnoids    SOCIAL HISTORY: Social History   Socioeconomic History   Marital status: Married    Spouse name: Not on file   Number of children: 0   Years of education: 18   Highest education level: Not on file  Occupational History   Occupation: Nurse executive   Tobacco Use   Smoking status: Never   Smokeless tobacco: Never  Vaping Use   Vaping Use: Never used  Substance and Sexual Activity   Alcohol use: Yes  Alcohol/week: 2.0 standard drinks of alcohol    Types: 2 Glasses of wine per week    Comment: 2-3 week with dinner   Drug use: No   Sexual activity: Not on file  Other Topics Concern   Not on file  Social History Narrative   Lives at home with her husband.   Right-handed.   2-3 diet cokes per day.   Social Determinants of Health   Financial Resource Strain: Not on file  Food Insecurity: Not on file  Transportation Needs: Not on file  Physical Activity: Not on file  Stress: Not on file  Social Connections: Not on file  Intimate Partner Violence: Not on file    FAMILY HISTORY: Family History  Problem Relation Age of Onset   Thyroid disease Mother    Colon cancer Father 61   Diabetes Father    Heart disease Father    Rectal cancer Father    Breast cancer Paternal Grandmother    Colon cancer Paternal Grandfather    Colon cancer Other        pggm   Stomach cancer Neg Hx    Esophageal cancer Neg Hx     ALLERGIES:  is allergic to buspirone, celebrex [celecoxib], adhesive [tape], and morphine and related.  MEDICATIONS:  Current Outpatient Medications  Medication Sig Dispense Refill   amLODipine (NORVASC) 10 MG tablet TAKE 1 TABLET BY MOUTH EVERY DAY 90 tablet 3   buPROPion (WELLBUTRIN XL) 150 MG 24 hr tablet TAKE 1 TABLET BY MOUTH EVERY DAY IN THE MORNING 90 tablet 1   cyanocobalamin (,VITAMIN B-12,) 1000 MCG/ML injection INJECT 1 ML (1,000 MCG TOTAL) INTO THE MUSCLE ONCE A WEEK. (Patient taking differently: Inject 1,000 mcg into the  muscle every 30 (thirty) days.) 12 mL 6   esomeprazole (NEXIUM) 20 MG capsule TAKE 1 CAPSULE (20 MG TOTAL) BY MOUTH DAILY. **PLEASE CONTACT OFFICE TO SCHEDULE FOLLOW UP 90 capsule 0   fluticasone (FLONASE) 50 MCG/ACT nasal spray USE 2 SPRAYS IN EACH NOSTRIL DAILY AS NEEDED 16 g 1   furosemide (LASIX) 20 MG tablet TAKE 1 TABLET (20 MG TOTAL) BY MOUTH DAILY WITH BREAKFAST AS NEEDED FOR EDEMA 90 tablet 1   HYDROcodone-acetaminophen (NORCO/VICODIN) 5-325 MG tablet Take 2 tablets by mouth every 6 (six) hours as needed for moderate pain. 240 tablet 0   LINZESS 145 MCG CAPS capsule TAKE 1 CAPSULE BY MOUTH EVERY DAY BEFORE BREAKFAST 90 capsule 0   metoprolol succinate (TOPROL-XL) 25 MG 24 hr tablet TAKE 1 TABLET BY MOUTH EVERY DAY 90 tablet 1   Multiple Vitamin (MULTIVITAMIN) tablet Take 1 tablet by mouth daily.     propranolol (INDERAL) 10 MG tablet TAKE 1/2 TABLET BY MOUTH AS NEEDED FOR ANXIETY 15 tablet 1   sodium chloride 1 g tablet Take 1 tablet (1 g total) by mouth 2 (two) times daily with a meal. 60 tablet 2   Syringe/Needle, Disp, (SYRINGE 3CC/25GX1") 25G X 1" 3 ML MISC Use for b12 injections 50 each 0   zolpidem (AMBIEN CR) 12.5 MG CR tablet TAKE 1 TABLET BY MOUTH AT BEDTIME AS NEEDED FOR SLEEP. 30 tablet 5   benzonatate (TESSALON) 200 MG capsule Take 1 capsule (200 mg total) by mouth 3 (three) times daily as needed for cough. (Patient not taking: Reported on 01/16/2022) 60 capsule 1   chlorpheniramine-HYDROcodone (TUSSIONEX) 10-8 MG/5ML Take 5 mLs by mouth every 12 (twelve) hours as needed for cough. (Patient not taking: Reported on 01/16/2022) 140 mL 0  nystatin (MYCOSTATIN) 100000 UNIT/ML suspension Take 5 mLs (500,000 Units total) by mouth 4 (four) times daily. (Patient not taking: Reported on 01/16/2022) 140 mL 0   predniSONE (DELTASONE) 10 MG tablet 6 tablets on Day 1 , then reduce by 1 tablet daily until gone (Patient not taking: Reported on 01/16/2022) 21 tablet 0   No current  facility-administered medications for this visit.    Review of Systems  Constitutional:  Positive for fatigue. Negative for appetite change, chills and fever.  HENT:   Negative for hearing loss and voice change.   Eyes:  Negative for eye problems.  Respiratory:  Negative for chest tightness and cough.   Cardiovascular:  Negative for chest pain.  Gastrointestinal:  Negative for abdominal distention, abdominal pain and blood in stool.  Endocrine: Negative for hot flashes.  Genitourinary:  Negative for difficulty urinating and frequency.   Musculoskeletal:  Negative for arthralgias.  Skin:  Negative for itching and rash.  Neurological:  Negative for extremity weakness.  Hematological:  Negative for adenopathy.  Psychiatric/Behavioral:  Negative for confusion.     PHYSICAL EXAMINATION: ECOG PERFORMANCE STATUS: 1 - Symptomatic but completely ambulatory Vitals:   01/16/22 1054  BP: 130/71  Pulse: 66  Temp: (!) 96.3 F (35.7 C)  SpO2: 100%   Filed Weights   01/16/22 1054  Weight: 112 lb 9.6 oz (51.1 kg)    Physical Exam Constitutional:      General: She is not in acute distress.    Comments: Thin built   HENT:     Head: Normocephalic and atraumatic.  Eyes:     General: No scleral icterus. Cardiovascular:     Rate and Rhythm: Normal rate and regular rhythm.     Heart sounds: Normal heart sounds.  Pulmonary:     Effort: Pulmonary effort is normal. No respiratory distress.     Breath sounds: No wheezing.  Abdominal:     General: Bowel sounds are normal. There is no distension.     Palpations: Abdomen is soft.  Musculoskeletal:        General: No deformity. Normal range of motion.     Cervical back: Normal range of motion and neck supple.  Skin:    General: Skin is warm and dry.     Findings: No erythema or rash.  Neurological:     Mental Status: She is alert and oriented to person, place, and time. Mental status is at baseline.     Cranial Nerves: No cranial nerve  deficit.     Coordination: Coordination normal.  Psychiatric:        Mood and Affect: Mood normal.      LABORATORY DATA:  I have reviewed the data as listed    Latest Ref Rng & Units 01/16/2022   12:08 PM 11/10/2021   10:43 AM 06/02/2021    9:00 AM  CBC  WBC 4.0 - 10.5 K/uL 4.4  3.7  4.8   Hemoglobin 12.0 - 15.0 g/dL 11.7  10.7  10.3   Hematocrit 36.0 - 46.0 % 33.5  31.0  30.9   Platelets 150 - 400 K/uL 387  309.0  274.0       Latest Ref Rng & Units 11/10/2021   10:43 AM 11/04/2021    8:13 AM 06/02/2021    9:00 AM  CMP  Glucose 70 - 99 mg/dL  87  88   BUN 6 - 23 mg/dL  15  15   Creatinine 0.40 - 1.20 mg/dL  0.97  0.97   Sodium 135 - 145 mEq/L  140  136   Potassium 3.5 - 5.1 mEq/L  3.6  3.9   Chloride 96 - 112 mEq/L  103  102   CO2 19 - 32 mEq/L  27  27   Calcium 8.4 - 10.5 mg/dL  9.3  9.0   Total Protein 6.1 - 8.1 g/dL 6.7  6.4  6.2   Total Bilirubin 0.2 - 1.2 mg/dL  0.4  0.5   Alkaline Phos 39 - 117 U/L  70  58   AST 0 - 37 U/L  19  21   ALT 0 - 35 U/L  14  18       Component Value Date/Time   IRON 127 01/16/2022 1208   IRON 152 (H) 04/22/2020 1118   IRON 76 02/07/2012 1518   TIBC 367 01/16/2022 1208   TIBC 339 04/22/2020 1118   TIBC 382 02/07/2012 1518   FERRITIN 83 01/16/2022 1208   FERRITIN 49 02/07/2012 1518   IRONPCTSAT 35 (H) 01/16/2022 1208   IRONPCTSAT 32 08/06/2020 1403     RADIOGRAPHIC STUDIES: I have personally reviewed the radiological images as listed and agreed with the findings in the report. DG Chest 2 View  Result Date: 12/24/2021 CLINICAL DATA:  productive cough, body aches x 10 days rule out pneumonia EXAM: CHEST - 2 VIEW COMPARISON:  07/12/2020 chest radiograph. FINDINGS: Partially visualized right total shoulder arthroplasty. Stable cardiomediastinal silhouette with normal heart size. No pneumothorax. No pleural effusion. Lungs appear clear, with no acute consolidative airspace disease and no pulmonary edema. Moderate long segment  dextrocurvature of the thoracolumbar spine. IMPRESSION: No active cardiopulmonary disease. Electronically Signed   By: Ilona Sorrel M.D.   On: 12/24/2021 17:11   DG Bone Density  Result Date: 12/23/2021 EXAM: DUAL X-RAY ABSORPTIOMETRY (DXA) FOR BONE MINERAL DENSITY IMPRESSION: Referring Physician:  [name] Your patient completed a bone mineral density test using GE Lunar iDXA system (analysis version: 16). Technologist: lmn PATIENT: Name: Adrienne Robinson, Adrienne Robinson Patient ID: [id number] Birth Date: February 19, 1956 Height: 62.5 in. Sex: Female Measured: 12/23/2021 Weight: 115.6 lbs. Indications: Bilateral Ovariectomy (65.51), Depression, Estrogen Deficient, Height Loss (781.91), Hysterectomy, Nexium, Postmenopausal, Wellbutrin Fractures: NONE Treatments: ASSESSMENT: The BMD measured at Femur Neck Left is 0.575 g/cm2 with a T-score of -3.3. This patient is considered osteoporotic according to [facility]) criteria. The quality of the exam is good. L3 and L4 were excluded due to degenerative changes Site Region Measured Date Measured Age YA BMD Significant CHANGE T-score AP Spine  L1-L2      12/23/2021    65.9         -3.1  0.799 g/cm2 DualFemur Neck Left  12/23/2021    65.9         -3.3    0.575 g/cm2 DualFemur Total Mean 12/23/2021    65.9         -3.0    0.631 g/cm2 World Health Organization [facility]) criteria for post-menopausal, Caucasian Women: Normal       T-score at or above -1 SD Osteopenia   T-score between -1 and -2.5 SD Osteoporosis T-score at or below -2.5 SD RECOMMENDATION: 1. All patients should optimize calcium and vitamin D intake. 2. Consider FDA-approved medical therapies in postmenopausal women and men aged 55 years and older, based on the following: a. A hip or vertebral (clinical or morphometric) fracture. b. T-score = -2.5 at the femoral neck or spine after appropriate evaluation to exclude secondary  causes. c. Low bone mass (T-score between -1.0 and -2.5 at the femoral neck or spine)  and a 10-year probability of a hip fracture = 3% or a 10-year probability of a major osteoporosis-related fracture = 20% based on the [place]-adapted WHO algorithm. d. Clinician judgment and/or patient preferences may indicate treatment for people with 10-year fracture probabilities above or below these levels. FOLLOW-UP: Patients with diagnosis of osteoporosis or at high risk for fracture should have regular bone mineral density tests.? Patients eligible for Medicare are allowed routine testing every 2 years.? The testing frequency can be increased to one year for patients who have rapidly progressing disease, are receiving or discontinuing medical therapy to restore bone mass, or have additional risk factors. I have reviewed this study and agree with the findings. [facility] Electronically Signed   By: Zerita Boers M.D.   On: 12/23/2021 09:25

## 2022-01-17 ENCOUNTER — Telehealth (INDEPENDENT_AMBULATORY_CARE_PROVIDER_SITE_OTHER): Payer: Managed Care, Other (non HMO) | Admitting: Internal Medicine

## 2022-01-17 ENCOUNTER — Encounter: Payer: Self-pay | Admitting: Internal Medicine

## 2022-01-17 VITALS — BP 130/71 | Ht 66.0 in | Wt 113.0 lb

## 2022-01-17 DIAGNOSIS — M810 Age-related osteoporosis without current pathological fracture: Secondary | ICD-10-CM | POA: Diagnosis not present

## 2022-01-17 LAB — ANTINUCLEAR ANTIBODIES, IFA: ANA Ab, IFA: NEGATIVE

## 2022-01-17 LAB — HAPTOGLOBIN: Haptoglobin: 120 mg/dL (ref 37–355)

## 2022-01-17 MED ORDER — HYDROCODONE-ACETAMINOPHEN 10-325 MG PO TABS: 1.0000 | ORAL_TABLET | Freq: Four times a day (QID) | ORAL | 0 refills | Status: DC | PRN

## 2022-01-17 NOTE — Assessment & Plan Note (Signed)
Discussed treatment options,  Calcium and Vit d requirements.  Prolia advised given her age and CKD.

## 2022-01-17 NOTE — Progress Notes (Signed)
Virtual Visit via [place]   Note    This format is felt to be most appropriate for this patient at this time.  All issues noted in this document were discussed and addressed.  No physical exam was performed (except for noted visual exam findings with Video Visits).   I connected with  Shaynah on 01/17/22 at  2:00 PM EST by a video enabled telemedicine application  and verified that I am speaking with the correct person using two identifiers. Location patient: home Location provider: work or home office Persons participating in the virtual visit: patient, provider  I discussed the limitations, risks, security and privacy concerns of performing an evaluation and management service by telephone and the availability of in person appointments. I also discussed with the patient that there may be a patient responsible charge related to this service. The patient expressed understanding and agreed to proceed.   Reason for visit: to review recent DEXA   HPI:  66 yr old female with a history of surgical menopause at age 4 , no history of HRT, presents for discussion of the result of screening DEXA diagnostic of osteoporosis.  She has no history of fragility fractures., dysphagia, malignancy,  or DVT.   She has had no prior treatment for osteoporosis .  She has had frequent rounds of prednisone tapers over the years to manage exacerbations of low back pain.    ROS: See pertinent positives and negatives per HPI.  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   s/p embolization  Feb 26 2012, [name]   B12 deficiency 01/2016   Candida esophagitis (HCC)    Cataract    Degenerative joint disease involving multiple joints    Depression    Dizziness    Hyperlipidemia    Hypertension    IBS (irritable bowel syndrome)    Pneumonia    approx 6 years ago   Post-COVID  chronic cough 02/12/2020   Prepatellar effusion of right knee 06/09/2019   Aspiration done June 13, 2019   Scoliosis    Spinal stenosis of lumbar region    Squamous cell carcinoma of skin 06/24/2018   R lateral calf   Tubular adenoma of colon     Past Surgical History:  Procedure Laterality Date   ABDOMINAL HYSTERECTOMY     APPENDECTOMY     CARDIAC CATHETERIZATION     normal coronaries, no wall motion abnormalities 11/02/09 [facility])   CATARACT EXTRACTION W/PHACO Left 11/24/2020   Procedure: CATARACT EXTRACTION PHACO AND INTRAOCULAR LENS PLACEMENT ([place]) LEFT 6.09 00:46.7;  Surgeon: [name], MD;  Location: [place];  Service: Ophthalmology;  Laterality: Left;  patient wants early   CATARACT EXTRACTION W/PHACO Right 12/08/2020   Procedure: CATARACT EXTRACTION PHACO AND INTRAOCULAR LENS PLACEMENT ([facility]) RIGHT 3.92 00:49.0;  Surgeon: [name], MD;  Location: [place];  Service: Ophthalmology;  Laterality: Right;  patient request early   NASAL SINUS SURGERY     OOPHORECTOMY     RADIOLOGY WITH ANESTHESIA  02/26/2012   Procedure: RADIOLOGY WITH ANESTHESIA;  Surgeon: [name], MD;  Location: [place];  Service: Radiology;  Laterality: N/A;   REVERSE SHOULDER ARTHROPLASTY Right 06/27/2013   Procedure: REVERSE RIGHT TOTAL SHOULDER ARTHROPLASTY;  Surgeon: [name], MD;  Location: [place];  Service: Orthopedics;  Laterality: Right;   SHOULDER  ARTHROSCOPY WITH ROTATOR CUFF REPAIR AND SUBACROMIAL DECOMPRESSION  2007   left   TONSILLECTOMY  adnoids    Family History  Problem Relation Age of Onset   Thyroid disease Mother    Colon cancer Father 53   Diabetes Father    Heart disease Father    Rectal cancer Father    Breast cancer Paternal Grandmother    Colon cancer Paternal Grandfather    Colon cancer Other        pggm   Stomach cancer Neg Hx    Esophageal cancer Neg Hx     SOCIAL HX:   reports that she has never smoked. She has never  used smokeless tobacco. She reports current alcohol use of about 2.0 standard drinks of alcohol per week. She reports that she does not use drugs.   Current Outpatient Medications:    amLODipine (NORVASC) 10 MG tablet, TAKE 1 TABLET BY MOUTH EVERY DAY, Disp: 90 tablet, Rfl: 3   buPROPion (WELLBUTRIN XL) 150 MG 24 hr tablet, TAKE 1 TABLET BY MOUTH EVERY DAY IN THE MORNING, Disp: 90 tablet, Rfl: 1   cyanocobalamin (,VITAMIN B-12,) 1000 MCG/ML injection, INJECT 1 ML (1,000 MCG TOTAL) INTO THE MUSCLE ONCE A WEEK. (Patient taking differently: Inject 1,000 mcg into the muscle every 30 (thirty) days.), Disp: 12 mL, Rfl: 6   esomeprazole (NEXIUM) 20 MG capsule, TAKE 1 CAPSULE (20 MG TOTAL) BY MOUTH DAILY. **PLEASE CONTACT OFFICE TO SCHEDULE FOLLOW UP, Disp: 90 capsule, Rfl: 0   fluticasone (FLONASE) 50 MCG/ACT nasal spray, USE 2 SPRAYS IN EACH NOSTRIL DAILY AS NEEDED, Disp: 16 g, Rfl: 1   furosemide (LASIX) 20 MG tablet, TAKE 1 TABLET (20 MG TOTAL) BY MOUTH DAILY WITH BREAKFAST AS NEEDED FOR EDEMA, Disp: 90 tablet, Rfl: 1   LINZESS 145 MCG CAPS capsule, TAKE 1 CAPSULE BY MOUTH EVERY DAY BEFORE BREAKFAST, Disp: 90 capsule, Rfl: 0   metoprolol succinate (TOPROL-XL) 25 MG 24 hr tablet, TAKE 1 TABLET BY MOUTH EVERY DAY, Disp: 90 tablet, Rfl: 1   Multiple Vitamin (MULTIVITAMIN) tablet, Take 1 tablet by mouth daily., Disp: , Rfl:    sodium chloride 1 g tablet, Take 1 tablet (1 g total) by mouth 2 (two) times daily with a meal., Disp: 60 tablet, Rfl: 2   Syringe/Needle, Disp, (SYRINGE 3CC/25GX1") 25G X 1" 3 ML MISC, Use for b12 injections, Disp: 50 each, Rfl: 0   zolpidem (AMBIEN CR) 12.5 MG CR tablet, TAKE 1 TABLET BY MOUTH AT BEDTIME AS NEEDED FOR SLEEP., Disp: 30 tablet, Rfl: 5   HYDROcodone-acetaminophen (NORCO) 10-325 MG tablet, Take 1 tablet by mouth every 6 (six) hours as needed., Disp: 120 tablet, Rfl: 0   propranolol (INDERAL) 10 MG tablet, TAKE 1/2 TABLET BY MOUTH AS NEEDED FOR ANXIETY (Patient not  taking: Reported on 01/17/2022), Disp: 15 tablet, Rfl: 1  EXAM:  VITALS per patient if applicable:  GENERAL: alert, oriented, appears well and in no acute distress  HEENT: atraumatic, conjunttiva clear, no obvious abnormalities on inspection of external nose and ears  NECK: normal movements of the head and neck  LUNGS: on inspection no signs of respiratory distress, breathing rate appears normal, no obvious gross SOB, gasping or wheezing  CV: no obvious cyanosis  MS: moves all visible extremities without noticeable abnormality  PSYCH/NEURO: pleasant and cooperative, no obvious depression or anxiety, speech and thought processing grossly intact  ASSESSMENT AND PLAN:  Discussed the following assessment and plan:  Osteoporosis without current pathological fracture, unspecified  osteoporosis type  Osteoporosis Discussed treatment options,  Calcium and Vit d requirements.  Prolia advised given her age and CKD.      I discussed the assessment and treatment plan with the patient. The patient was provided an opportunity to ask questions and all were answered. The patient agreed with the plan and demonstrated an understanding of the instructions.   The patient was advised to call back or seek an in-person evaluation if the symptoms worsen or if the condition fails to improve as anticipated.   I spent 20 minutes dedicated to the care of this patient on the date of this encounter to include pre-visit review of her medical history,  Face-to-face time with the patient , and post visit ordering of testing and therapeutics.    [name], MD

## 2022-01-18 ENCOUNTER — Other Ambulatory Visit: Payer: Self-pay

## 2022-01-18 ENCOUNTER — Encounter: Payer: Self-pay | Admitting: Family Medicine

## 2022-01-18 ENCOUNTER — Other Ambulatory Visit: Payer: Self-pay | Admitting: Internal Medicine

## 2022-01-18 ENCOUNTER — Telehealth: Payer: Self-pay | Admitting: *Deleted

## 2022-01-18 ENCOUNTER — Ambulatory Visit: Payer: Self-pay

## 2022-01-18 ENCOUNTER — Ambulatory Visit: Payer: Managed Care, Other (non HMO) | Admitting: Family Medicine

## 2022-01-18 VITALS — BP 122/84 | HR 60 | Ht 66.0 in | Wt 113.0 lb

## 2022-01-18 DIAGNOSIS — G8929 Other chronic pain: Secondary | ICD-10-CM

## 2022-01-18 DIAGNOSIS — M1812 Unilateral primary osteoarthritis of first carpometacarpal joint, left hand: Secondary | ICD-10-CM

## 2022-01-18 DIAGNOSIS — M1711 Unilateral primary osteoarthritis, right knee: Secondary | ICD-10-CM | POA: Diagnosis not present

## 2022-01-18 DIAGNOSIS — M25512 Pain in left shoulder: Secondary | ICD-10-CM

## 2022-01-18 DIAGNOSIS — D649 Anemia, unspecified: Secondary | ICD-10-CM

## 2022-01-18 LAB — COMP PANEL: LEUKEMIA/LYMPHOMA

## 2022-01-18 LAB — HUMAN PARVOVIRUS DNA DETECTION BY PCR: Parvovirus B19, PCR: NEGATIVE

## 2022-01-18 LAB — CMV DNA, QUANTITATIVE, PCR
CMV DNA Quant: NEGATIVE IU/mL
Log10 CMV Qn DNA Pl: UNDETERMINED log10 IU/mL

## 2022-01-18 NOTE — Assessment & Plan Note (Signed)
New problem  Discussed HEP  Discussed may need xray  RTC in 6 weeks

## 2022-01-18 NOTE — Patient Instructions (Addendum)
Trigger point injections today Injected R knee and L thumb See me again in December at your next scheduled visit

## 2022-01-18 NOTE — Telephone Encounter (Signed)
Submitted info for benefit verification for new Prolia candidate.  Per Dr. Derrel Nip, she has CKD so would prefer to use Prolia rather than alendronate (if a reason is needed ) thank you

## 2022-01-18 NOTE — Assessment & Plan Note (Signed)
Chronic problem with exacerbation.  Discussed icing regimen and home exercises.  Discussed which activities to do and which ones to avoid have responded extremely well and feels like she responds better to this in the epidurals.  Follow-up with me again in 6 to 8 weeks

## 2022-01-18 NOTE — Assessment & Plan Note (Signed)
Repeat injection given again today  Discussed HEP  Discussed the possible need for surgical intervention at some point.  Patient wants to hold at this time.  Will follow-up with me again in 8 to 10 weeks

## 2022-01-19 LAB — EPSTEIN BARR VRS(EBV DNA BY PCR): EBV DNA QN by PCR: NEGATIVE IU/mL

## 2022-01-19 NOTE — Telephone Encounter (Signed)
Patient is now scheduled for BM Bx ob 01/31/22 at 8:30. Lamar Sprinkles can you please schedule patient to see Dr. Tasia Catchings one week after for follow-up.

## 2022-01-20 MED ORDER — HYDROCODONE-ACETAMINOPHEN 5-325 MG PO TABS: 2.0000 | ORAL_TABLET | Freq: Four times a day (QID) | ORAL | 0 refills | Status: DC | PRN

## 2022-01-20 NOTE — Addendum Note (Signed)
Addended by: [name] on: 01/20/2022 09:46 AM   Modules accepted: Orders

## 2022-01-23 NOTE — Telephone Encounter (Signed)
Pt called and confirmed that she will be at her scheduled biopsy.

## 2022-01-24 NOTE — Telephone Encounter (Signed)
Please advise 

## 2022-01-25 ENCOUNTER — Ambulatory Visit: Payer: Managed Care, Other (non HMO) | Admitting: Dermatology

## 2022-01-25 ENCOUNTER — Encounter: Payer: Self-pay | Admitting: Dermatology

## 2022-01-25 ENCOUNTER — Ambulatory Visit (INDEPENDENT_AMBULATORY_CARE_PROVIDER_SITE_OTHER): Payer: Managed Care, Other (non HMO) | Admitting: Family

## 2022-01-25 ENCOUNTER — Encounter: Payer: Self-pay | Admitting: Family

## 2022-01-25 ENCOUNTER — Ambulatory Visit: Payer: Managed Care, Other (non HMO) | Admitting: Nurse Practitioner

## 2022-01-25 VITALS — BP 123/78 | HR 60 | Temp 98.1°F | Ht 66.0 in | Wt 111.6 lb

## 2022-01-25 VITALS — BP 120/79 | HR 59

## 2022-01-25 DIAGNOSIS — Z85828 Personal history of other malignant neoplasm of skin: Secondary | ICD-10-CM

## 2022-01-25 DIAGNOSIS — L570 Actinic keratosis: Secondary | ICD-10-CM

## 2022-01-25 DIAGNOSIS — L820 Inflamed seborrheic keratosis: Secondary | ICD-10-CM

## 2022-01-25 DIAGNOSIS — C4441 Basal cell carcinoma of skin of scalp and neck: Secondary | ICD-10-CM | POA: Diagnosis not present

## 2022-01-25 DIAGNOSIS — L988 Other specified disorders of the skin and subcutaneous tissue: Secondary | ICD-10-CM | POA: Diagnosis not present

## 2022-01-25 DIAGNOSIS — L578 Other skin changes due to chronic exposure to nonionizing radiation: Secondary | ICD-10-CM

## 2022-01-25 DIAGNOSIS — C4491 Basal cell carcinoma of skin, unspecified: Secondary | ICD-10-CM

## 2022-01-25 DIAGNOSIS — L821 Other seborrheic keratosis: Secondary | ICD-10-CM

## 2022-01-25 DIAGNOSIS — D229 Melanocytic nevi, unspecified: Secondary | ICD-10-CM

## 2022-01-25 DIAGNOSIS — Z1283 Encounter for screening for malignant neoplasm of skin: Secondary | ICD-10-CM | POA: Diagnosis not present

## 2022-01-25 DIAGNOSIS — L814 Other melanin hyperpigmentation: Secondary | ICD-10-CM

## 2022-01-25 DIAGNOSIS — L565 Disseminated superficial actinic porokeratosis (DSAP): Secondary | ICD-10-CM

## 2022-01-25 DIAGNOSIS — D492 Neoplasm of unspecified behavior of bone, soft tissue, and skin: Secondary | ICD-10-CM

## 2022-01-25 DIAGNOSIS — D225 Melanocytic nevi of trunk: Secondary | ICD-10-CM

## 2022-01-25 DIAGNOSIS — R051 Acute cough: Secondary | ICD-10-CM | POA: Diagnosis not present

## 2022-01-25 DIAGNOSIS — Z23 Encounter for immunization: Secondary | ICD-10-CM | POA: Diagnosis not present

## 2022-01-25 HISTORY — DX: Basal cell carcinoma of skin, unspecified: C44.91

## 2022-01-25 NOTE — Patient Instructions (Addendum)
Cryotherapy Aftercare  Wash gently with soap and water everyday.   Apply Vaseline daily until healed.      Wound Care Instructions  Cleanse wound gently with soap and water once a day then pat dry with clean gauze. Apply a thin coat of Petrolatum (petroleum jelly, "Vaseline") over the wound (unless you have an allergy to this). We recommend that you use a new, sterile tube of Vaseline. Do not pick or remove scabs. Do not remove the yellow or white "healing tissue" from the base of the wound.  Cover the wound with fresh, clean, nonstick gauze and secure with paper tape. You may use Band-Aids in place of gauze and tape if the wound is small enough, but would recommend trimming much of the tape off as there is often too much. Sometimes Band-Aids can irritate the skin.  You should call the office for your biopsy report after 1 week if you have not already been contacted.  If you experience any problems, such as abnormal amounts of bleeding, swelling, significant bruising, significant pain, or evidence of infection, please call the office immediately.  FOR ADULT SURGERY PATIENTS: If you need something for pain relief you may take 1 extra strength Tylenol (acetaminophen) AND 2 Ibuprofen ('200mg'$  each) together every 4 hours as needed for pain. (do not take these if you are allergic to them or if you have a reason you should not take them.) Typically, you may only need pain medication for 1 to 3 days.      Recommend daily broad spectrum sunscreen SPF 30+ to sun-exposed areas, reapply every 2 hours as needed. Call for new or changing lesions.  Staying in the shade or wearing long sleeves, sun glasses (UVA+UVB protection) and wide brim hats (4-inch brim around the entire circumference of the hat) are also recommended for sun protection.    Melanoma ABCDEs  Melanoma is the most dangerous type of skin cancer, and is the leading cause of death from skin disease.  You are more likely to develop  melanoma if you: Have light-colored skin, light-colored eyes, or red or blond hair Spend a lot of time in the sun Tan regularly, either outdoors or in a tanning bed Have had blistering sunburns, especially during childhood Have a close family member who has had a melanoma Have atypical moles or large birthmarks  Early detection of melanoma is key since treatment is typically straightforward and cure rates are extremely high if we catch it early.   The first sign of melanoma is often a change in a mole or a new dark spot.  The ABCDE system is a way of remembering the signs of melanoma.  A for asymmetry:  The two halves do not match. B for border:  The edges of the growth are irregular. C for color:  A mixture of colors are present instead of an even brown color. D for diameter:  Melanomas are usually (but not always) greater than 19m - the size of a pencil eraser. E for evolution:  The spot keeps changing in size, shape, and color.  Please check your skin once per month between visits. You can use a small mirror in front and a large mirror behind you to keep an eye on the back side or your body.   If you see any new or changing lesions before your next follow-up, please call to schedule a visit.  Please continue daily skin protection including broad spectrum sunscreen SPF 30+ to sun-exposed areas, reapplying every 2  hours as needed when you're outdoors.   Staying in the shade or wearing long sleeves, sun glasses (UVA+UVB protection) and wide brim hats (4-inch brim around the entire circumference of the hat) are also recommended for sun protection.    Due to recent changes in healthcare laws, you may see results of your pathology and/or laboratory studies on MyChart before the doctors have had a chance to review them. We understand that in some cases there may be results that are confusing or concerning to you. Please understand that not all results are received at the same time and often the  doctors may need to interpret multiple results in order to provide you with the best plan of care or course of treatment. Therefore, we ask that you please give [place] 2 business days to thoroughly review all your results before contacting the office for clarification. Should we see a critical lab result, you will be contacted sooner.   If You Need Anything After Your Visit  If you have any questions or concerns for your doctor, please call our main line at [phone] and press option 4 to reach your doctor's medical assistant. If no one answers, please leave a voicemail as directed and we will return your call as soon as possible. Messages left after 4 pm will be answered the following business day.   You may also send [place] a message via [place]. We typically respond to MyChart messages within 1-2 business days.  For prescription refills, please ask your pharmacy to contact our office. Our fax number is [phone].  If you have an urgent issue when the clinic is closed that cannot wait until the next business day, you can page your doctor at the number below.    Please note that while we do our best to be available for urgent issues outside of office hours, we are not available 24/7.   If you have an urgent issue and are unable to reach [place], you may choose to seek medical care at your doctor's office, retail clinic, urgent care center, or emergency room.  If you have a medical emergency, please immediately call 911 or go to the emergency department.  Pager Numbers  - Dr. Nehemiah Massed: [phone]  - Dr. Laurence Ferrari: [phone]  - Dr. Nicole Kindred: [phone]  In the event of inclement weather, please call our main line at [phone] for an update on the status of any delays or closures.  Dermatology Medication Tips: Please keep the boxes that topical medications come in in order to help keep track of the instructions about where and how to use these. Pharmacies typically print the medication  instructions only on the boxes and not directly on the medication tubes.   If your medication is too expensive, please contact our office at [phone] option 4 or send [place] a message through [place].   We are unable to tell what your co-pay for medications will be in advance as this is different depending on your insurance coverage. However, we may be able to find a substitute medication at lower cost or fill out paperwork to get insurance to cover a needed medication.   If a prior authorization is required to get your medication covered by your insurance company, please allow [place] 1-2 business days to complete this process.  Drug prices often vary depending on where the prescription is filled and some pharmacies may offer cheaper prices.  The website www.goodrx.com contains coupons for medications through different pharmacies. The prices here do not account for  what the cost may be with help from insurance (it may be cheaper with your insurance), but the website can give you the price if you did not use any insurance.  - You can print the associated coupon and take it with your prescription to the pharmacy.  - You may also stop by our office during regular business hours and pick up a GoodRx coupon card.  - If you need your prescription sent electronically to a different pharmacy, notify our office through [facility] or by phone at [phone] option 4.     Si Usted Necesita Algo Despus de Su Visita  Tambin puede enviarnos un mensaje a travs de Pharmacist, community. Por lo general respondemos a los mensajes de MyChart en el transcurso de 1 a 2 das hbiles.  Para renovar recetas, por favor pida a su farmacia que se ponga en contacto con nuestra oficina. Harland Dingwall de fax es [place] [phone].  Si tiene un asunto urgente cuando la clnica est cerrada y que no puede esperar hasta el siguiente da hbil, puede llamar/localizar a su doctor(a) al nmero que aparece a continuacin.   Por  favor, tenga en cuenta que aunque hacemos todo lo posible para estar disponibles para asuntos urgentes fuera del horario de [place], no estamos disponibles las 24 horas del da, los 7 das de la [place].   Si tiene un problema urgente y no puede comunicarse con nosotros, puede optar por buscar atencin mdica  en el consultorio de su doctor(a), en una clnica privada, en un centro de atencin urgente o en una sala de emergencias.  Si tiene Engineering geologist, por favor llame inmediatamente al 911 o vaya a la sala de emergencias.  Nmeros de bper  - Dr. Nehemiah Massed: [phone]  - Dra. Moye: [phone]  - Dra. Nicole Kindred: [phone]  En caso de inclemencias del [place], por favor llame a Johnsie Kindred principal al [phone] para una actualizacin sobre el [place] de cualquier retraso o cierre.  Consejos para la medicacin en dermatologa: Por favor, guarde las cajas en las que vienen los medicamentos de uso tpico para ayudarle a seguir las instrucciones sobre dnde y cmo usarlos. Las farmacias generalmente imprimen las instrucciones del medicamento slo en las cajas y no directamente en los tubos del [place].   Si su medicamento es muy caro, por favor, pngase en contacto con Zigmund Daniel llamando al [phone] y presione la opcin 4 o envenos un mensaje a travs de Pharmacist, community.   No podemos decirle cul ser su copago por los medicamentos por adelantado ya que esto es diferente dependiendo de la cobertura de su seguro. Sin embargo, es posible que podamos encontrar un medicamento sustituto a Electrical engineer un formulario para que el seguro cubra el medicamento que se considera necesario.   Si se requiere una autorizacin previa para que su compaa de seguros [place] su medicamento, por favor permtanos de 1 a 2 das hbiles para completar este proceso.  Los precios de los medicamentos varan con frecuencia dependiendo del Environmental consultant de dnde se surte la receta y alguna farmacias  pueden ofrecer precios ms baratos.  El sitio web www.goodrx.com tiene cupones para medicamentos de Airline pilot. Los precios aqu no tienen en cuenta lo que podra costar con la ayuda del seguro (puede ser ms barato con su seguro), pero el sitio web puede darle el precio si no utiliz Research scientist (physical sciences).  - Puede imprimir el cupn correspondiente y llevarlo con su receta a la farmacia.  - Tambin puede  pasar por nuestra oficina durante el horario de atencin regular y Charity fundraiser una tarjeta de cupones de GoodRx.  - Si necesita que su receta se enve electrnicamente a una farmacia diferente, informe a nuestra oficina a travs de MyChart de  o por telfono llamando al [phone] y presione la opcin 4.

## 2022-01-25 NOTE — Patient Instructions (Addendum)
Trial stop flonase.   Instead, start azelastine ( anti histamine ) in its place.   Please let me know how you are doing.

## 2022-01-25 NOTE — Progress Notes (Signed)
Subjective:    Patient ID: Adrienne Robinson, female    DOB: 1955-08-08, 66 y.o.   MRN: [id number]  CC: Adrienne Robinson is a 66 y.o. female who presents today for an acute visit.    HPI: She notes 3 days ago she was going to chruch and started to cough again. Cough lasted for the entire day. Endorses green mucous.   Cough has largely resolved today. Congestion has resolved.   She is using mucinex D , flonase.   No fever, sob, wheezing.   She would like flu and pneumonia vaccine today  H/o seasonal allergies  She has had cough x one month and treated bacterial sinusitis. She completed Levaquin, doxycycline, prednisone taper.   Treated with augmentin 12/09/21. Patient states she had all the symptoms of the flu.     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   Prepatellar effusion of right knee 06/09/2019   Aspiration done June 13, 2019   Scoliosis    Spinal stenosis of lumbar region    Squamous cell carcinoma of skin 06/24/2018   R lateral calf   Tubular adenoma of colon    Past Surgical History:  Procedure Laterality Date   ABDOMINAL HYSTERECTOMY     APPENDECTOMY     CARDIAC CATHETERIZATION     normal coronaries, no wall motion abnormalities 11/02/09 [facility])   CATARACT EXTRACTION W/PHACO Left 11/24/2020   Procedure: CATARACT EXTRACTION PHACO AND INTRAOCULAR LENS PLACEMENT ([place]) LEFT  6.09 00:46.7;  Surgeon: [name], MD;  Location: [place];  Service: Ophthalmology;  Laterality: Left;  patient wants early   CATARACT EXTRACTION W/PHACO Right 12/08/2020   Procedure: CATARACT EXTRACTION PHACO AND INTRAOCULAR LENS PLACEMENT ([facility]) RIGHT 3.92 00:49.0;  Surgeon: [name], MD;  Location: [place];  Service: Ophthalmology;  Laterality: Right;  patient request early   NASAL SINUS SURGERY     OOPHORECTOMY     RADIOLOGY WITH ANESTHESIA  02/26/2012   Procedure: RADIOLOGY WITH ANESTHESIA;  Surgeon: [name], MD;  Location: [place];  Service: Radiology;  Laterality: N/A;   REVERSE SHOULDER ARTHROPLASTY Right 06/27/2013   Procedure: REVERSE RIGHT TOTAL SHOULDER ARTHROPLASTY;  Surgeon: [name], MD;  Location: [place];  Service: Orthopedics;  Laterality: Right;   SHOULDER ARTHROSCOPY WITH ROTATOR CUFF REPAIR AND SUBACROMIAL DECOMPRESSION  2007   left   TONSILLECTOMY  adnoids   Family History  Problem Relation Age of Onset   Thyroid disease Mother    Colon cancer Father 44   Diabetes Father    Heart disease Father    Rectal cancer Father    Breast cancer Paternal Grandmother    Colon cancer Paternal Grandfather    Colon cancer Other        pggm   Stomach cancer Neg Hx  Esophageal cancer Neg Hx     Allergies: Buspirone, Celebrex [celecoxib], Adhesive [tape], and Morphine and related Current Outpatient Medications on File Prior to Visit  Medication Sig Dispense Refill   amLODipine (NORVASC) 10 MG tablet TAKE 1 TABLET BY MOUTH EVERY DAY 90 tablet 3   buPROPion (WELLBUTRIN XL) 150 MG 24 hr tablet TAKE 1 TABLET BY MOUTH EVERY DAY IN THE MORNING 90 tablet 1   cyanocobalamin (,VITAMIN B-12,) 1000 MCG/ML injection INJECT 1 ML (1,000 MCG TOTAL) INTO THE MUSCLE ONCE A WEEK. (Patient taking differently: Inject 1,000 mcg into the muscle every 30 (thirty) days.) 12 mL 6   esomeprazole (NEXIUM) 20 MG capsule TAKE 1 CAPSULE (20 MG TOTAL)  BY MOUTH DAILY. **PLEASE CONTACT OFFICE TO SCHEDULE FOLLOW UP 90 capsule 0   fluticasone (FLONASE) 50 MCG/ACT nasal spray USE 2 SPRAYS IN EACH NOSTRIL DAILY AS NEEDED 16 g 1   furosemide (LASIX) 20 MG tablet TAKE 1 TABLET (20 MG TOTAL) BY MOUTH DAILY WITH BREAKFAST AS NEEDED FOR EDEMA 90 tablet 1   HYDROcodone-acetaminophen (NORCO/VICODIN) 5-325 MG tablet Take 2 tablets by mouth every 6 (six) hours as needed for moderate pain. 240 tablet 0   LINZESS 145 MCG CAPS capsule TAKE 1 CAPSULE BY MOUTH EVERY DAY BEFORE BREAKFAST 90 capsule 0   metoprolol succinate (TOPROL-XL) 25 MG 24 hr tablet TAKE 1 TABLET BY MOUTH EVERY DAY 90 tablet 1   Multiple Vitamin (MULTIVITAMIN) tablet Take 1 tablet by mouth daily.     propranolol (INDERAL) 10 MG tablet TAKE 1/2 TABLET BY MOUTH AS NEEDED FOR ANXIETY 15 tablet 1   sodium chloride 1 g tablet Take 1 tablet (1 g total) by mouth 2 (two) times daily with a meal. 60 tablet 2   Syringe/Needle, Disp, (SYRINGE 3CC/25GX1") 25G X 1" 3 ML MISC Use for b12 injections 50 each 0   zolpidem (AMBIEN CR) 12.5 MG CR tablet TAKE 1 TABLET BY MOUTH AT BEDTIME AS NEEDED FOR SLEEP. 30 tablet 5   No current facility-administered medications on file prior to visit.    Social History   Tobacco Use   Smoking status: Never   Smokeless tobacco: Never  Vaping Use   Vaping Use: Never used  Substance Use Topics   Alcohol use: Yes    Alcohol/week: 2.0 standard drinks of alcohol    Types: 2 Glasses of wine per week    Comment: 2-3 week with dinner   Drug use: No    Review of Systems  Constitutional:  Negative for chills and fever.  HENT:  Negative for congestion.   Respiratory:  Negative for cough.   Cardiovascular:  Negative for chest pain and palpitations.  Gastrointestinal:  Negative for nausea and vomiting.      Objective:    BP 123/78   Pulse 60   Temp 98.1 F (36.7 C) (Oral)   Ht '5\' 6"'$  (1.676 m)   Wt 111 lb 9.6 oz (50.6 kg)   SpO2 96%   BMI 18.01 kg/m     Physical Exam Vitals reviewed.  Constitutional:      Appearance: She is well-developed.  HENT:     Head: Normocephalic and atraumatic.     Right Ear: Hearing, tympanic membrane, ear canal and external ear normal. No decreased hearing noted. No drainage, swelling or tenderness. No middle ear effusion. No foreign body. Tympanic membrane is not erythematous or bulging.     Left Ear: Hearing, tympanic membrane, ear canal and external ear normal. No decreased hearing  noted. No drainage, swelling or tenderness.  No middle ear effusion. No foreign body. Tympanic membrane is not erythematous or bulging.     Nose: Nose normal. No rhinorrhea.     Right Sinus: No maxillary sinus tenderness or frontal sinus tenderness.     Left Sinus: No maxillary sinus tenderness or frontal sinus tenderness.     Mouth/Throat:     Pharynx: Uvula midline. No oropharyngeal exudate or posterior oropharyngeal erythema.     Tonsils: No tonsillar abscesses.  Eyes:     Conjunctiva/sclera: Conjunctivae normal.  Cardiovascular:     Rate and Rhythm: Regular rhythm.     Pulses: Normal pulses.     Heart sounds: Normal heart sounds.  Pulmonary:     Effort: Pulmonary effort is normal.     Breath sounds: Normal breath sounds. No wheezing, rhonchi or rales.  Lymphadenopathy:     Head:     Right side of head: No submental, submandibular, tonsillar, preauricular, posterior auricular or occipital adenopathy.     Left side of head: No submental, submandibular, tonsillar, preauricular, posterior auricular or occipital adenopathy.     Cervical: No cervical adenopathy.  Skin:    General: Skin is warm and dry.  Neurological:     Mental Status: She is alert.  Psychiatric:        Speech: Speech normal.        Behavior: Behavior normal.        Thought Content: Thought content normal.        Assessment & Plan:   Problem List Items Addressed This Visit       Other   Cough    Fortunately cough and congestion have largely  resolved.  Afebrile.  At this time patient and I jointly agreed to defer further antibiotic therapy as symptoms have improved.  She will certainly let me know if symptoms were to resume.  Advise she may trial azelastine and lieu of Flonase to see if she finds to be more helpful.  PCV20 and flu vaccines given today      Other Visit Diagnoses     Need for immunization against influenza    -  Primary   Relevant Orders   Flu Vaccine QUAD High Dose(Fluad) (Completed)   Need for pneumococcal 20-valent conjugate vaccination       Relevant Orders   Pneumococcal conjugate vaccine 20-valent (Prevnar 20) (Completed)         I am having Kermit Balo maintain her fluticasone, SYRINGE 3CC/25GX1", multivitamin, sodium chloride, cyanocobalamin, amLODipine, propranolol, furosemide, zolpidem, esomeprazole, buPROPion, metoprolol succinate, Linzess, and HYDROcodone-acetaminophen.   No orders of the defined types were placed in this encounter.   Return precautions given.   Risks, benefits, and alternatives of the medications and treatment plan prescribed today were discussed, and patient expressed understanding.   Education regarding symptom management and diagnosis given to patient on AVS.  Continue to follow with [name], MD for routine health maintenance.   Kermit Balo and I agreed with plan.   Mable Paris, FNP

## 2022-01-25 NOTE — Progress Notes (Signed)
Follow-Up Visit   Subjective  Adrienne Robinson is a 66 y.o. female who presents for the following: Annual Exam (Hx of SCC. Hx of DSAP. Using [facility] Cholesterol/Lovastatin compounded cream at bedtime during flares. Isn't using twice daily because it is sticky and smells bad, per patient ).  She has several persistent scaly spots on face, arm, legs.  She has a new bump behind her ear.  She has a itchy growth on her collarbone irritated by clothing/jewelry.  The patient presents for Total-Body Skin Exam (TBSE) for skin cancer screening and mole check.  The patient has spots, moles and lesions to be evaluated, some may be new or changing and the patient has concerns that these could be cancer.   The following portions of the chart were reviewed this encounter and updated as appropriate:      Review of Systems: No other skin or systemic complaints except as noted in HPI or Assessment and Plan.   Objective  Well appearing patient in no apparent distress; mood and affect are within normal limits.  A full examination was performed including scalp, head, eyes, ears, nose, lips, neck, chest, axillae, abdomen, back, buttocks, bilateral upper extremities, bilateral lower extremities, hands, feet, fingers, toes, fingernails, and toenails. All findings within normal limits unless otherwise noted below.  legs and arms Pink brown scaly macules some with keratotic rim    Left Forearm x2, right forearm x1, left pretibia x2, left malar cheek x1, left lateral cheek x1, right calf x2, right upper arm x1 (10) Erythematous thin papules/macules with gritty scale.   back Tan-brown and/or pink-flesh-colored symmetric macules and papules.   Left Postauricular Neck 56m pink pearly papule     right clavicle x1 Erythematous keratotic or waxy stuck-on papule  lower face Rhytides and volume loss. Perioral/lips   Assessment & Plan   History of Squamous Cell Carcinoma of the Skin. Right lateral  calf. 2020. - No evidence of recurrence today - Recommend regular full body skin exams - Recommend daily broad spectrum sunscreen SPF 30+ to sun-exposed areas, reapply every 2 hours as needed.  - Call if any new or changing lesions are noted between office visits  Lentigines - Scattered tan macules - Due to sun exposure - Benign-appearing, observe - Recommend daily broad spectrum sunscreen SPF 30+ to sun-exposed areas, reapply every 2 hours as needed. - Call for any changes  Seborrheic Keratoses - Stuck-on, waxy, tan-brown papules and/or plaques  - Benign-appearing - Discussed benign etiology and prognosis. - Observe - Call for any changes  Melanocytic Nevi - Tan-brown and/or pink-flesh-colored symmetric macules and papules - Benign appearing on exam today - Observation - Call clinic for new or changing moles - Recommend daily use of broad spectrum spf 30+ sunscreen to sun-exposed areas.   Hemangiomas - Red papules - Discussed benign nature - Observe - Call for any changes  Actinic Damage - Chronic condition, secondary to cumulative UV/sun exposure - diffuse scaly erythematous macules with underlying dyspigmentation - Recommend daily broad spectrum sunscreen SPF 30+ to sun-exposed areas, reapply every 2 hours as needed.  - Staying in the shade or wearing long sleeves, sun glasses (UVA+UVB protection) and wide brim hats (4-inch brim around the entire circumference of the hat) are also recommended for sun protection.  - Call for new or changing lesions.  Skin cancer screening performed today.  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 Condition  is symptomatic / bothersome to patient. Not to goal.  DSAP is a chronic inherited condition of sun-exposed skin, most commonly affecting the arms and legs.  It is difficult to treat.  Recommend photoprotection and regular  use of spf 30 or higher sunscreen to prevent worsening of condition and precancerous changes.  Continue Cholesterol 2% Lovastatin 2% Cream 240 gm- Apply twice daily as directed to affected areas arms and legs.  Rfs sent to [facility]   AK (actinic keratosis) (10) Left Forearm x2, right forearm x1, left pretibia x2, left malar cheek x1, left lateral cheek x1, right calf x2, right upper arm x1  AK's vs ISKs on face, right upper arm Actinic keratoses are precancerous spots that appear secondary to cumulative UV radiation exposure/sun exposure over time. They are chronic with expected duration over 1 year. A portion of actinic keratoses will progress to squamous cell carcinoma of the skin. It is not possible to reliably predict which spots will progress to skin cancer and so treatment is recommended to prevent development of skin cancer.  Recommend daily broad spectrum sunscreen SPF 30+ to sun-exposed areas, reapply every 2 hours as needed.  Recommend staying in the shade or wearing long sleeves, sun glasses (UVA+UVB protection) and wide brim hats (4-inch brim around the entire circumference of the hat). Call for new or changing lesions.  Destruction of lesion - Left Forearm x2, right forearm x1, left pretibia x2, left malar cheek x1, left lateral cheek x1, right calf x2, right upper arm x1  Destruction method: cryotherapy   Informed consent: discussed and consent obtained   Lesion destroyed using liquid nitrogen: Yes   Region frozen until ice ball extended beyond lesion: Yes   Outcome: patient tolerated procedure well with no complications   Post-procedure details: wound care instructions given   Additional details:  Prior to procedure, discussed risks of blister formation, small wound, skin dyspigmentation, or rare scar following cryotherapy. Recommend Vaseline ointment to treated areas while healing.   Melanocytic nevi of trunk back  Benign-appearing. Stable compared to previous visit.  Observation.  Call clinic for new or changing moles.  Recommend daily use of broad spectrum spf 30+ sunscreen to sun-exposed areas.    Reviewed photos of back from 08/27/2019. No changes  Neoplasm of skin Left Postauricular Neck  Epidermal / dermal shaving  Lesion diameter (cm):  0.6 Informed consent: discussed and consent obtained   Patient was prepped and draped in usual sterile fashion: Area prepped with alcohol. Anesthesia: the lesion was anesthetized in a standard fashion   Anesthetic:  1% lidocaine w/ epinephrine 1-100,000 buffered w/ 8.4% NaHCO3 Instrument used: flexible razor blade   Hemostasis achieved with: pressure, aluminum chloride and electrodesiccation   Outcome: patient tolerated procedure well    Destruction of lesion  Destruction method: electrodesiccation and curettage   Informed consent: discussed and consent obtained   Curettage performed in three different directions: Yes   Electrodesiccation performed over the curetted area: Yes   Final wound size (cm):  0.8 Hemostasis achieved with:  pressure, aluminum chloride and electrodesiccation Outcome: patient tolerated procedure well with no complications   Post-procedure details: wound care instructions given   Additional details:  Mupirocin ointment and Bandaid applied   Specimen 1 - Surgical pathology Differential Diagnosis: ISK vs BCC  Check Margins: No  Inflamed seborrheic keratosis right clavicle x1  Symptomatic, irritating, patient would like treated.  Destruction of lesion - right clavicle x1  Destruction method: cryotherapy   Informed consent: discussed and  consent obtained   Lesion destroyed using liquid nitrogen: Yes   Region frozen until ice ball extended beyond lesion: Yes   Outcome: patient tolerated procedure well with no complications   Post-procedure details: wound care instructions given   Additional details:  Prior to procedure, discussed risks of blister formation, small wound, skin  dyspigmentation, or rare scar following cryotherapy. Recommend Vaseline ointment to treated areas while healing.   Elastosis of skin lower face  Recommend Restylane Refyne. $650 per syringe. Plan to start with 1 syringe.   Return in about 1 year (around 01/26/2023) for TBSE, HxSCC, Biopsy Follow Up in 6 months.  I, Emelia Salisbury, CMA, am acting as scribe for Brendolyn Patty, MD.  Documentation: I have reviewed the above documentation for accuracy and completeness, and I agree with the above.  Brendolyn Patty MD

## 2022-01-27 DIAGNOSIS — R059 Cough, unspecified: Secondary | ICD-10-CM | POA: Insufficient documentation

## 2022-01-27 NOTE — Assessment & Plan Note (Addendum)
Fortunately cough and congestion have largely resolved.  Afebrile.  At this time patient and I jointly agreed to defer further antibiotic therapy as symptoms have improved.  She will certainly let me know if symptoms were to resume.  Advise she may trial azelastine and lieu of Flonase to see if she finds to be more helpful.  PCV20 and flu vaccines given today

## 2022-01-29 NOTE — Telephone Encounter (Signed)
$  301 due, PA required. Will proceed with PA if patient agrees to amount due. Sent [employer].

## 2022-01-29 NOTE — H&P (Signed)
Chief Complaint: Chronic normocytic anemia. Request is for bone marrow for further evaluation  Referring Physician(s): Yu,Zhou  Supervising Physician: {Supervising Physician:[id number]}  Patient Status: [facility] - Out-pt  History of Present Illness: Adrienne Robinson is a 66 y.o. female female outpatient. History of SSC of the skin, scoliosis, HTN, HLD, AVM of the brain s/p embolization, Found to have chronic normocytic anemia. Patient has previously been negative for iron deficiency anemia and B12 deficiency anemia. Team is requesting a bone marrow biopsy for further evaluation.   Currently without any significant complaints. Patient alert and laying in bed,calm. Denies any fevers, headache, chest pain, SOB, cough, abdominal pain, nausea, vomiting or bleeding. Return precautions and treatment recommendations and follow-up discussed with the patient *** who is agreeable with the plan.    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   Prepatellar effusion of right knee 06/09/2019   Aspiration done June 13, 2019   Scoliosis    Spinal stenosis of lumbar region    Squamous cell carcinoma of skin 06/24/2018   R lateral calf   Tubular adenoma of colon     Past Surgical History:  Procedure Laterality Date   ABDOMINAL HYSTERECTOMY     APPENDECTOMY     CARDIAC CATHETERIZATION     normal coronaries, no wall motion abnormalities 11/02/09 [facility])   CATARACT EXTRACTION W/PHACO Left 11/24/2020   Procedure: CATARACT EXTRACTION  PHACO AND INTRAOCULAR LENS PLACEMENT ([facility]) LEFT 6.09 00:46.7;  Surgeon: [name], MD;  Location: [facility];  Service: Ophthalmology;  Laterality: Left;  patient wants early   CATARACT EXTRACTION W/PHACO Right 12/08/2020   Procedure: CATARACT EXTRACTION PHACO AND INTRAOCULAR LENS PLACEMENT ([facility]) RIGHT 3.92 00:49.0;  Surgeon: [name], MD;  Location: [facility];  Service: Ophthalmology;  Laterality: Right;  patient request early   NASAL SINUS SURGERY     OOPHORECTOMY     RADIOLOGY WITH ANESTHESIA  02/26/2012   Procedure: RADIOLOGY WITH ANESTHESIA;  Surgeon: [name], MD;  Location: [facility];  Service: Radiology;  Laterality: N/A;   REVERSE SHOULDER ARTHROPLASTY Right 06/27/2013   Procedure: REVERSE RIGHT TOTAL SHOULDER ARTHROPLASTY;  Surgeon: [name], MD;  Location: [facility];  Service: Orthopedics;  Laterality: Right;   SHOULDER ARTHROSCOPY WITH ROTATOR CUFF REPAIR AND SUBACROMIAL DECOMPRESSION  2007   left   TONSILLECTOMY  adnoids    Allergies: Buspirone, Celebrex [celecoxib], Adhesive [tape], and Morphine and related  Medications: Prior to Admission medications   Medication Sig Start Date End Date Taking? Authorizing Provider  amLODipine (NORVASC) 10 MG tablet TAKE 1 TABLET BY MOUTH EVERY DAY 07/11/21   [name], MD  buPROPion (WELLBUTRIN XL) 150 MG 24 hr tablet TAKE 1 TABLET BY MOUTH EVERY DAY IN THE MORNING 01/11/22   [name], MD  cyanocobalamin (,VITAMIN B-12,) 1000 MCG/ML injection INJECT 1 ML (1,000 MCG TOTAL) INTO THE MUSCLE ONCE A WEEK. Patient taking  differently: Inject 1,000 mcg into the muscle every 30 (thirty) days. 11/23/20   [name], MD  esomeprazole (NEXIUM) 20 MG capsule TAKE 1 CAPSULE (20 MG TOTAL) BY MOUTH DAILY. **PLEASE CONTACT OFFICE TO SCHEDULE FOLLOW UP 12/26/21   Pyrtle, Lajuan Lines, MD  fluticasone (FLONASE) 50 MCG/ACT nasal spray USE 2 SPRAYS IN EACH NOSTRIL DAILY AS NEEDED 05/29/18   [name], MD   furosemide (LASIX) 20 MG tablet TAKE 1 TABLET (20 MG TOTAL) BY MOUTH DAILY WITH BREAKFAST AS NEEDED FOR EDEMA 12/06/21   [name], MD  HYDROcodone-acetaminophen (NORCO/VICODIN) 5-325 MG tablet Take 2 tablets by mouth every 6 (six) hours as needed for moderate pain. 01/20/22   [name], MD  LINZESS 145 MCG CAPS capsule TAKE 1 CAPSULE BY MOUTH EVERY DAY BEFORE BREAKFAST 01/18/22   Pyrtle, Lajuan Lines, MD  metoprolol succinate (TOPROL-XL) 25 MG 24 hr tablet TAKE 1 TABLET BY MOUTH EVERY DAY 01/11/22   [name], MD  Multiple Vitamin (MULTIVITAMIN) tablet Take 1 tablet by mouth daily.    [provider]  propranolol (INDERAL) 10 MG tablet TAKE 1/2 TABLET BY MOUTH AS NEEDED FOR ANXIETY 10/21/21   Dutch Quint B, FNP  sodium chloride 1 g tablet Take 1 tablet (1 g total) by mouth 2 (two) times daily with a meal. 09/15/20   [name], MD  Syringe/Needle, Disp, (SYRINGE 3CC/25GX1") 25G X 1" 3 ML MISC Use for b12 injections 08/21/18   [name], MD  zolpidem (AMBIEN CR) 12.5 MG CR tablet TAKE 1 TABLET BY MOUTH AT BEDTIME AS NEEDED FOR SLEEP. 12/19/21   [name], MD     Family History  Problem Relation Age of Onset   Thyroid disease Mother    Colon cancer Father 4   Diabetes Father    Heart disease Father    Rectal cancer Father    Breast cancer Paternal Grandmother    Colon cancer Paternal Grandfather    Colon cancer Other        pggm   Stomach cancer Neg Hx    Esophageal cancer Neg Hx     Social History   Socioeconomic History   Marital status: Married    Spouse name: Not on file   Number of children: 0   Years of education: 18   Highest education level: Not on file  Occupational History   Occupation: Nurse executive   Tobacco Use   Smoking status: Never   Smokeless tobacco: Never  Vaping Use   Vaping Use: Never used  Substance and Sexual Activity   Alcohol use: Yes    Alcohol/week: 2.0 standard drinks of alcohol    Types: 2 Glasses of wine  per week    Comment: 2-3 week with dinner   Drug use: No   Sexual activity: Not on file  Other Topics Concern   Not on file  Social History Narrative   Lives at home with her husband.   Right-handed.   2-3 diet cokes per day.   Social Determinants of Health   Financial Resource Strain: Not on file  Food Insecurity: Not on file  Transportation Needs: Not on file  Physical Activity: Not on file  Stress: Not on file  Social Connections: Not on file    ECOG Status: {CHL ONC ECOG GY:[id number]}  Review of Systems: A 12 point ROS discussed and pertinent positives are indicated in the HPI above.  All other systems are negative.  Review  of Systems  Vital Signs: There were no vitals taken for this visit.  Advance Care Plan: {Advance Care LTRV:20233}    Physical Exam  Imaging: No results found.  Labs:  CBC: Recent Labs    06/02/21 0900 11/10/21 1043 01/16/22 1208  WBC 4.8 3.7* 4.4  HGB 10.3* 10.7* 11.7*  HCT 30.9* 31.0* 33.5*  PLT 274.0 309.0 387    COAGS: No results for input(s): "INR", "APTT" in the last 8760 hours.  BMP: Recent Labs    06/02/21 0900 11/04/21 0813  NA 136 140  K 3.9 3.6  CL 102 103  CO2 27 27  GLUCOSE 88 87  BUN 15 15  CALCIUM 9.0 9.3  CREATININE 0.97 0.97    LIVER FUNCTION TESTS: Recent Labs    06/02/21 0900 11/04/21 0813 11/10/21 1043  BILITOT 0.5 0.4  --   AST 21 19  --   ALT 18 14  --   ALKPHOS 58 70  --   PROT 6.2 6.4 6.7  ALBUMIN 4.3 4.0  --     Assessment and Plan:  66 y.o. female outpatient. History of SSC of the skin, scoliosis, HTN, HLD, AVM of the brain s/p embolization, Found to have chronic normocytic anemia. Patient has previously been negative for iron deficiency anemia and B12 deficiency anemia. Team is requesting a bone marrow biopsy for further evaluation.   *** All labs and medications are within acceptable parameters. Pertinent allergies include morphine and tape. Patient has been NPO since midnight.    Risks and benefits of bone marrow biopsy was discussed with the patient and/or patient's family including, but not limited to bleeding, infection, damage to adjacent structures or low yield requiring additional tests.  All of the questions were answered and there is agreement to proceed.  Consent signed and in chart.   Thank you for this interesting consult.  I greatly enjoyed meeting Adrienne Robinson and look forward to participating in their care.  A copy of this report was sent to the requesting provider on this date.  Electronically Signed: Jacqualine Mau, NP 01/29/2022, 8:10 PM   I spent a total of {New IDHW:[id number]} {New Out-Pt:[id number]}  {Established Out-Pt:[id number]} in face to face in clinical consultation, greater than 50% of which was counseling/coordinating care for ***

## 2022-01-30 ENCOUNTER — Other Ambulatory Visit: Payer: Self-pay | Admitting: Student

## 2022-01-30 ENCOUNTER — Telehealth: Payer: Self-pay

## 2022-01-30 DIAGNOSIS — D649 Anemia, unspecified: Secondary | ICD-10-CM

## 2022-01-30 NOTE — Progress Notes (Signed)
Patient for Bone Marrow Biopsy on Tues 01/31/2022, I called and spoke with the patient on the  phone and gave pre-procedure instructions. Pt was made aware to be here at 7:30a at the new entrance, NPO after MN prior to procedure as well as driver post procedure/recovery/discharge. Pt stated understanding. Called 01/30/2022

## 2022-01-30 NOTE — Telephone Encounter (Signed)
Returned patients call she states the areas frozen by Dr. Nicole Kindred on 01/25/22 have not done anything as far as blistering up, discussed with patient areas froze may not blister it may take 3-4 weeks to see if the area will go away, if not gone in 4 weeks return to the clinic

## 2022-01-30 NOTE — Telephone Encounter (Signed)
Patient left nurse VM on Friday. She states the areas frozen by Dr. Nicole Kindred on 01/25/22 have not done anything as far as blistering up, etc.   Left message for patient to return my call. aw

## 2022-01-31 ENCOUNTER — Ambulatory Visit
Admission: RE | Admit: 2022-01-31 | Discharge: 2022-01-31 | Disposition: A | Discharge status: Home / Self Care | Payer: Managed Care, Other (non HMO) | Source: Ambulatory Visit | Attending: Oncology | Admitting: Oncology

## 2022-01-31 ENCOUNTER — Other Ambulatory Visit: Payer: Self-pay

## 2022-01-31 DIAGNOSIS — D649 Anemia, unspecified: Secondary | ICD-10-CM | POA: Insufficient documentation

## 2022-01-31 LAB — CBC WITH DIFFERENTIAL/PLATELET
Abs Immature Granulocytes: 0.01 10*3/uL (ref 0.00–0.07)
Basophils Absolute: 0 10*3/uL (ref 0.0–0.1)
Basophils Relative: 1 %
Eosinophils Absolute: 0 10*3/uL (ref 0.0–0.5)
Eosinophils Relative: 1 %
HCT: 31.6 % — ABNORMAL LOW (ref 36.0–46.0)
Hemoglobin: 10.8 g/dL — ABNORMAL LOW (ref 12.0–15.0)
Immature Granulocytes: 0 %
Lymphocytes Relative: 38 %
Lymphs Abs: 1.6 10*3/uL (ref 0.7–4.0)
MCH: 33.3 pg (ref 26.0–34.0)
MCHC: 34.2 g/dL (ref 30.0–36.0)
MCV: 97.5 fL (ref 80.0–100.0)
Monocytes Absolute: 0.5 10*3/uL (ref 0.1–1.0)
Monocytes Relative: 12 %
Neutro Abs: 2 10*3/uL (ref 1.7–7.7)
Neutrophils Relative %: 48 %
Platelets: 306 10*3/uL (ref 150–400)
RBC: 3.24 MIL/uL — ABNORMAL LOW (ref 3.87–5.11)
RDW: 12.7 % (ref 11.5–15.5)
WBC: 4.2 10*3/uL (ref 4.0–10.5)
nRBC: 0 % (ref 0.0–0.2)

## 2022-01-31 MED ORDER — FENTANYL CITRATE (PF) 100 MCG/2ML IJ SOLN
INTRAMUSCULAR | Status: AC
  Filled 2022-01-31: qty 2

## 2022-01-31 MED ORDER — HEPARIN SOD (PORK) LOCK FLUSH 100 UNIT/ML IV SOLN
INTRAVENOUS | Status: AC
  Filled 2022-01-31: qty 5

## 2022-01-31 MED ORDER — MIDAZOLAM HCL 2 MG/2ML IJ SOLN
INTRAMUSCULAR | Status: AC | PRN
  Administered 2022-01-31 (×2): 1 mg via INTRAVENOUS

## 2022-01-31 MED ORDER — LIDOCAINE HCL (PF) 1 % IJ SOLN
10.0000 mL | Freq: Once | INTRAMUSCULAR | Status: DC
  Filled 2022-01-31: qty 10

## 2022-01-31 MED ORDER — FENTANYL CITRATE (PF) 100 MCG/2ML IJ SOLN
INTRAMUSCULAR | Status: AC | PRN
  Administered 2022-01-31 (×2): 50 ug via INTRAVENOUS

## 2022-01-31 MED ORDER — SODIUM CHLORIDE 0.9 % IV SOLN: INTRAVENOUS | Status: DC

## 2022-01-31 MED ORDER — MIDAZOLAM HCL 2 MG/2ML IJ SOLN
INTRAMUSCULAR | Status: AC
  Filled 2022-01-31: qty 2

## 2022-01-31 NOTE — Progress Notes (Deleted)
Adrienne Robinson Phone: [phone] Subjective:    I'm seeing this patient by the request  of:  [name], MD  CC:   [id number]  01/18/2022 Chronic problem with exacerbation.  Discussed icing regimen and home exercises.  Discussed which activities to do and which ones to avoid have responded extremely well and feels like she responds better to this in the epidurals.  Follow-up with me again in 6 to 8 weeks     Repeat injection given again today  Discussed HEP  Discussed the possible need for surgical intervention at some point.  Patient wants to hold at this time.  Will follow-up with me again in 8 to 10 weeks  Update 02/02/2022 Adrienne Robinson is a 66 y.o. female coming in with complaint of L thumb and L shoulder pain. 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   Prepatellar effusion of right knee 06/09/2019   Aspiration done June 13, 2019   Scoliosis    Spinal stenosis of lumbar region    Squamous cell carcinoma of skin 06/24/2018   R lateral calf   Tubular adenoma of colon    Past Surgical History:  Procedure Laterality Date   ABDOMINAL HYSTERECTOMY     APPENDECTOMY     CARDIAC CATHETERIZATION     normal coronaries, no wall motion abnormalities 11/02/09 [facility])   CATARACT EXTRACTION W/PHACO Left 11/24/2020   Procedure: CATARACT EXTRACTION PHACO AND INTRAOCULAR LENS PLACEMENT ([place]) LEFT 6.09 00:46.7;   Surgeon: [name], MD;  Location: [place];  Service: Ophthalmology;  Laterality: Left;  patient wants early   CATARACT EXTRACTION W/PHACO Right 12/08/2020   Procedure: CATARACT EXTRACTION PHACO AND INTRAOCULAR LENS PLACEMENT ([facility]) RIGHT 3.92 00:49.0;  Surgeon: [name], MD;  Location: [place];  Service: Ophthalmology;  Laterality: Right;  patient request early   NASAL SINUS SURGERY     OOPHORECTOMY     RADIOLOGY WITH ANESTHESIA  02/26/2012   Procedure: RADIOLOGY WITH ANESTHESIA;  Surgeon: [name], MD;  Location: [place];  Service: Radiology;  Laterality: N/A;   REVERSE SHOULDER ARTHROPLASTY Right 06/27/2013   Procedure: REVERSE RIGHT TOTAL SHOULDER ARTHROPLASTY;  Surgeon: [name], MD;  Location: [place];  Service: Orthopedics;  Laterality: Right;   SHOULDER ARTHROSCOPY WITH ROTATOR CUFF REPAIR AND SUBACROMIAL DECOMPRESSION  2007   left   TONSILLECTOMY  adnoids   Social History   Socioeconomic History   Marital status: Married    Spouse name: Not on file   Number of children: 0   Years of education: 18   Highest education level: Not on file  Occupational History   Occupation: Nurse executive   Tobacco Use   Smoking status: Never   Smokeless tobacco: Never  Vaping Use   Vaping Use: Never used  Substance and  Sexual Activity   Alcohol use: Yes    Alcohol/week: 2.0 standard drinks of alcohol    Types: 2 Glasses of wine per week    Comment: 2-3 week with dinner   Drug use: No   Sexual activity: Yes  Other Topics Concern   Not on file  Social History Narrative   Lives at home with her husband.   Right-handed.   2-3 diet cokes per day.   Social Determinants of Health   Financial Resource Strain: Not on file  Food Insecurity: Not on file  Transportation Needs: Not on file  Physical Activity: Not on file  Stress: Not on file  Social Connections: Not on file   Allergies  Allergen Reactions   Buspirone Other (See  Comments)    Dizziness,  dry mouth    Celebrex [Celecoxib] Swelling   Adhesive [Tape] Rash and Other (See Comments)    Redness, blisters, skin peeling off.   Morphine And Related Rash   Family History  Problem Relation Age of Onset   Thyroid disease Mother    Colon cancer Father 21   Diabetes Father    Heart disease Father    Rectal cancer Father    Breast cancer Paternal Grandmother    Colon cancer Paternal Grandfather    Colon cancer Other        pggm   Stomach cancer Neg Hx    Esophageal cancer Neg Hx       Current Outpatient Medications (Cardiovascular):    amLODipine (NORVASC) 10 MG tablet, TAKE 1 TABLET BY MOUTH EVERY DAY   furosemide (LASIX) 20 MG tablet, TAKE 1 TABLET (20 MG TOTAL) BY MOUTH DAILY WITH BREAKFAST AS NEEDED FOR EDEMA   metoprolol succinate (TOPROL-XL) 25 MG 24 hr tablet, TAKE 1 TABLET BY MOUTH EVERY DAY   propranolol (INDERAL) 10 MG tablet, TAKE 1/2 TABLET BY MOUTH AS NEEDED FOR ANXIETY   Current Outpatient Medications (Respiratory):    fluticasone (FLONASE) 50 MCG/ACT nasal spray, USE 2 SPRAYS IN EACH NOSTRIL DAILY AS NEEDED   Current Outpatient Medications (Analgesics):    HYDROcodone-acetaminophen (NORCO/VICODIN) 5-325 MG tablet, Take 2 tablets by mouth every 6 (six) hours as needed for moderate pain.   Facility-Administered Medications Ordered in Other Visits (Analgesics):    fentaNYL (SUBLIMAZE) 100 MCG/2ML injection   lidocaine (PF) (XYLOCAINE) 1 % injection 10 mL  Current Outpatient Medications (Hematological):    cyanocobalamin (,VITAMIN B-12,) 1000 MCG/ML injection, INJECT 1 ML (1,000 MCG TOTAL) INTO THE MUSCLE ONCE A WEEK. (Patient taking differently: Inject 1,000 mcg into the muscle every 30 (thirty) days.)   Facility-Administered Medications Ordered in Other Visits (Hematological):    heparin lock flush 100 UNIT/ML injection  Current Outpatient Medications (Other):    buPROPion (WELLBUTRIN XL) 150 MG 24 hr tablet, TAKE 1 TABLET BY  MOUTH EVERY DAY IN THE MORNING   esomeprazole (NEXIUM) 20 MG capsule, TAKE 1 CAPSULE (20 MG TOTAL) BY MOUTH DAILY. **PLEASE CONTACT OFFICE TO SCHEDULE FOLLOW UP   LINZESS 145 MCG CAPS capsule, TAKE 1 CAPSULE BY MOUTH EVERY DAY BEFORE BREAKFAST   Multiple Vitamin (MULTIVITAMIN) tablet, Take 1 tablet by mouth daily.   sodium chloride 1 g tablet, Take 1 tablet (1 g total) by mouth 2 (two) times daily with a meal.   Syringe/Needle, Disp, (SYRINGE 3CC/25GX1") 25G X 1" 3 ML MISC, Use for b12 injections   zolpidem (AMBIEN CR) 12.5 MG CR tablet, TAKE 1 TABLET BY MOUTH AT BEDTIME AS NEEDED FOR SLEEP.  Facility-Administered Medications Ordered in Other Visits (Other):    0.9 %  sodium chloride infusion   midazolam (VERSED) 2 MG/2ML injection No current facility-administered medications for this visit.   Reviewed prior external information including notes and imaging from  primary care provider As well as notes that were available from care everywhere and other healthcare systems.  Past medical history, social, surgical and family history all reviewed in electronic medical record.  No pertanent information unless stated regarding to the chief complaint.   Review of Systems:  No headache, visual changes, nausea, vomiting, diarrhea, constipation, dizziness, abdominal pain, skin rash, fevers, chills, night sweats, weight loss, swollen lymph nodes, body aches, joint swelling, chest pain, shortness of breath, mood changes. POSITIVE muscle aches  Objective  There were no vitals taken for this visit.   General: No apparent distress alert and oriented x3 mood and affect normal, dressed appropriately.  HEENT: Pupils equal, extraocular movements intact  Respiratory: Patient's speak in full sentences and does not appear short of breath  Cardiovascular: No lower extremity edema, non tender, no erythema      Impression and Recommendations:

## 2022-01-31 NOTE — Procedures (Signed)
Vascular and Interventional Radiology Procedure Note  Patient: Adrienne Robinson DOB: 1955/11/16 Medical Record Number: [id number] Note Date/Time: 01/31/22 9:44 AM   Performing Physician: Michaelle Birks, MD Assistant(s): None  Diagnosis: Anemia  Procedure: BONE MARROW ASPIRATION and BIOPSY  Anesthesia: Conscious Sedation Complications: None Estimated Blood Loss: Minimal Specimens: Sent for Pathology  Findings:  Successful CT-guided bone marrow aspiration and biopsy A total of 2 cores were obtained. Hemostasis of the tract was achieved using Manual Pressure.  Plan: Bed rest for 1 hours.  See detailed procedure note with images in PACS. The patient tolerated the procedure well without incident or complication and was returned to Recovery in stable condition.    Michaelle Birks, MD [facility] Radiology   Pager. [place]

## 2022-02-02 ENCOUNTER — Ambulatory Visit: Payer: Managed Care, Other (non HMO) | Admitting: Family Medicine

## 2022-02-02 LAB — SURGICAL PATHOLOGY

## 2022-02-06 ENCOUNTER — Telehealth: Payer: Self-pay

## 2022-02-06 ENCOUNTER — Ambulatory Visit (INDEPENDENT_AMBULATORY_CARE_PROVIDER_SITE_OTHER): Payer: Managed Care, Other (non HMO) | Admitting: Dermatology

## 2022-02-06 VITALS — BP 142/85 | HR 81

## 2022-02-06 DIAGNOSIS — L578 Other skin changes due to chronic exposure to nonionizing radiation: Secondary | ICD-10-CM | POA: Diagnosis not present

## 2022-02-06 DIAGNOSIS — Z85828 Personal history of other malignant neoplasm of skin: Secondary | ICD-10-CM | POA: Diagnosis not present

## 2022-02-06 DIAGNOSIS — Z872 Personal history of diseases of the skin and subcutaneous tissue: Secondary | ICD-10-CM

## 2022-02-06 NOTE — Progress Notes (Signed)
   Follow-Up Visit   Subjective  Adrienne Robinson is a 66 y.o. female who presents for the following: check BCC bx/edc site (L post auricular neck, pt wanted you to check the wound ) and AKs vs ISKs (L malar cheek, L lat cheek, LN2 01/25/2022, pt thinks may need to be treated again).   The following portions of the chart were reviewed this encounter and updated as appropriate:       Review of Systems:  No other skin or systemic complaints except as noted in HPI or Assessment and Plan.  Objective  Well appearing patient in no apparent distress; mood and affect are within normal limits.  A focused examination was performed including face, neck. Relevant physical exam findings are noted in the Assessment and Plan.  L post auricular neck Healing edc site with heme crusted scab, no evidence of infection  L malar cheek, L lat cheek Pink macules, no scale         Assessment & Plan  History of basal cell carcinoma (BCC) L post auricular neck  Healing edc site, cont wound care qd until healed.  History of actinic keratoses L malar cheek, L lat cheek  Vs ISKs  Resolving from LN2, recheck on f/u  Discussed that pinkness will gradually fade.  If not improved on f/up, will consider repeat cryotherapy, but too early to repeat treatment today.  Actinic Damage - chronic, secondary to cumulative UV radiation exposure/sun exposure over time - diffuse erythematous macules with underlying dyspigmentation - Recommend daily broad spectrum sunscreen SPF 30+ to sun-exposed areas, reapply every 2 hours as needed.  - Recommend staying in the shade or wearing long sleeves, sun glasses (UVA+UVB protection) and wide brim hats (4-inch brim around the entire circumference of the hat). - Call for new or changing lesions.   Return in about 2 months (around 04/09/2022) for AK f/u.  I, Othelia Pulling, RMA, am acting as scribe for Brendolyn Patty, MD .  Documentation: I have reviewed the above  documentation for accuracy and completeness, and I agree with the above.  Brendolyn Patty MD

## 2022-02-06 NOTE — Telephone Encounter (Signed)
Advised patient biopsy of the left postauricular neck was BCC and was treated with EDC. No further treatment needed. Patient would like site checked as she thinks it may be infected. Appt scheduled today at 3:15 PM.

## 2022-02-06 NOTE — Telephone Encounter (Signed)
-----   Message from Brendolyn Patty, MD sent at 02/06/2022  8:38 AM EST ----- Skin , left postauricular neck BASAL CELL CARCINOMA WITH FOCAL SCLEROSIS, SEE DESCRIPTION  BCC skin cancer- already treated with EDC at time of biopsy  - please call patient

## 2022-02-06 NOTE — Patient Instructions (Signed)
Due to recent changes in healthcare laws, you may see results of your pathology and/or laboratory studies on MyChart before the doctors have had a chance to review them. We understand that in some cases there may be results that are confusing or concerning to you. Please understand that not all results are received at the same time and often the doctors may need to interpret multiple results in order to provide you with the best plan of care or course of treatment. Therefore, we ask that you please give us 2 business days to thoroughly review all your results before contacting the office for clarification. Should we see a critical lab result, you will be contacted sooner.   If You Need Anything After Your Visit  If you have any questions or concerns for your doctor, please call our main line at [phone] and press option 4 to reach your doctor's medical assistant. If no one answers, please leave a voicemail as directed and we will return your call as soon as possible. Messages left after 4 pm will be answered the following business day.   You may also send us a message via MyChart. We typically respond to MyChart messages within 1-2 business days.  For prescription refills, please ask your pharmacy to contact our office. Our fax number is [phone].  If you have an urgent issue when the clinic is closed that cannot wait until the next business day, you can page your doctor at the number below.    Please note that while we do our best to be available for urgent issues outside of office hours, we are not available 24/7.   If you have an urgent issue and are unable to reach us, you may choose to seek medical care at your doctor's office, retail clinic, urgent care center, or emergency room.  If you have a medical emergency, please immediately call 911 or go to the emergency department.  Pager Numbers  - Dr. Kowalski: [phone]  - Dr. Moye: [phone]  - Dr. Stewart:  [phone]  In the event of inclement weather, please call our main line at [phone] for an update on the status of any delays or closures.  Dermatology Medication Tips: Please keep the boxes that topical medications come in in order to help keep track of the instructions about where and how to use these. Pharmacies typically print the medication instructions only on the boxes and not directly on the medication tubes.   If your medication is too expensive, please contact our office at [phone] option 4 or send us a message through MyChart.   We are unable to tell what your co-pay for medications will be in advance as this is different depending on your insurance coverage. However, we may be able to find a substitute medication at lower cost or fill out paperwork to get insurance to cover a needed medication.   If a prior authorization is required to get your medication covered by your insurance company, please allow us 1-2 business days to complete this process.  Drug prices often vary depending on where the prescription is filled and some pharmacies may offer cheaper prices.  The website www.goodrx.com contains coupons for medications through different pharmacies. The prices here do not account for what the cost may be with help from insurance (it may be cheaper with your insurance), but the website can give you the price if you did not use any insurance.  - You can print the associated coupon and take it with   your prescription to the pharmacy.  - You may also stop by our office during regular business hours and pick up a GoodRx coupon card.  - If you need your prescription sent electronically to a different pharmacy, notify our office through [place] MyChart or by phone at [phone] option 4.     Si Usted Necesita Algo Despus de Su Visita  Tambin puede enviarnos un mensaje a travs de MyChart. Por lo general respondemos a los mensajes de MyChart en el transcurso de 1 a 2  das hbiles.  Para renovar recetas, por favor pida a su farmacia que se ponga en contacto con nuestra oficina. Nuestro nmero de fax es el [phone].  Si tiene un asunto urgente cuando la clnica est cerrada y que no puede esperar hasta el siguiente da hbil, puede llamar/localizar a su doctor(a) al nmero que aparece a continuacin.   Por favor, tenga en cuenta que aunque hacemos todo lo posible para estar disponibles para asuntos urgentes fuera del horario de oficina, no estamos disponibles las 24 horas del da, los 7 das de la semana.   Si tiene un problema urgente y no puede comunicarse con nosotros, puede optar por buscar atencin mdica  en el consultorio de su doctor(a), en una clnica privada, en un centro de atencin urgente o en una sala de emergencias.  Si tiene una emergencia mdica, por favor llame inmediatamente al 911 o vaya a la sala de emergencias.  Nmeros de bper  - Dr. Kowalski: [phone]  - Dra. Moye: [phone]  - Dra. Stewart: [phone]  En caso de inclemencias del tiempo, por favor llame a nuestra lnea principal al [phone] para una actualizacin sobre el estado de cualquier retraso o cierre.  Consejos para la medicacin en dermatologa: Por favor, guarde las cajas en las que vienen los medicamentos de uso tpico para ayudarle a seguir las instrucciones sobre dnde y cmo usarlos. Las farmacias generalmente imprimen las instrucciones del medicamento slo en las cajas y no directamente en los tubos del medicamento.   Si su medicamento es muy caro, por favor, pngase en contacto con nuestra oficina llamando al [phone] y presione la opcin 4 o envenos un mensaje a travs de MyChart.   No podemos decirle cul ser su copago por los medicamentos por adelantado ya que esto es diferente dependiendo de la cobertura de su seguro. Sin embargo, es posible que podamos encontrar un medicamento sustituto a menor costo o llenar un formulario para que el  seguro cubra el medicamento que se considera necesario.   Si se requiere una autorizacin previa para que su compaa de seguros cubra su medicamento, por favor permtanos de 1 a 2 das hbiles para completar este proceso.  Los precios de los medicamentos varan con frecuencia dependiendo del lugar de dnde se surte la receta y alguna farmacias pueden ofrecer precios ms baratos.  El sitio web www.goodrx.com tiene cupones para medicamentos de diferentes farmacias. Los precios aqu no tienen en cuenta lo que podra costar con la ayuda del seguro (puede ser ms barato con su seguro), pero el sitio web puede darle el precio si no utiliz ningn seguro.  - Puede imprimir el cupn correspondiente y llevarlo con su receta a la farmacia.  - Tambin puede pasar por nuestra oficina durante el horario de atencin regular y recoger una tarjeta de cupones de GoodRx.  - Si necesita que su receta se enve electrnicamente a una farmacia diferente, informe a nuestra oficina a travs de MyChart de [place]   o por telfono llamando al [phone] y presione la opcin 4.  

## 2022-02-07 ENCOUNTER — Encounter: Payer: Self-pay | Admitting: Oncology

## 2022-02-07 ENCOUNTER — Telehealth: Payer: Self-pay

## 2022-02-07 ENCOUNTER — Inpatient Hospital Stay: Payer: Managed Care, Other (non HMO) | Attending: Oncology | Admitting: Oncology

## 2022-02-07 VITALS — BP 140/84 | HR 86 | Temp 97.4°F | Resp 18 | Wt 110.4 lb

## 2022-02-07 DIAGNOSIS — D649 Anemia, unspecified: Secondary | ICD-10-CM | POA: Insufficient documentation

## 2022-02-07 DIAGNOSIS — Z79899 Other long term (current) drug therapy: Secondary | ICD-10-CM | POA: Diagnosis not present

## 2022-02-07 DIAGNOSIS — R634 Abnormal weight loss: Secondary | ICD-10-CM | POA: Insufficient documentation

## 2022-02-07 MED ORDER — IRON-VITAMIN C 65-125 MG PO TABS: 1.0000 | ORAL_TABLET | Freq: Every day | ORAL | 0 refills | Status: DC

## 2022-02-07 MED ORDER — IRON-VITAMIN C 65-125 MG PO TABS: 1.0000 | ORAL_TABLET | Freq: Every day | ORAL | 3 refills | Status: DC

## 2022-02-07 NOTE — Telephone Encounter (Signed)
Called [facility] bone marrow path lab and spoke to [place]. Requested for MDS FISH testing to be added to bone marrow biospy specimen collected on 01/31/22.

## 2022-02-07 NOTE — Progress Notes (Signed)
Pt here for follow up. No new concerns voiced.   

## 2022-02-07 NOTE — Assessment & Plan Note (Addendum)
Chronic anemia, with fluctuating levels.  Labs reviewed and discussed with patient.  Normal reticulocyte panel, no immunophenotypic abnormality peripheral blood flow cytometry, normal LDH, normal haptoglobin, normal iron TIBC ferritin, negative ANA, normal TSH, negative CMV, negative EBV, negative parvovirus Her previous workup was negative for iron deficiency, folate deficiency, B12 deficiency.   Bone marrow biopsy results reviewed and discussed with patient.  Normal cellularity with no increased blast.  No significant dysplastic.  Cytogenetics pending. She has scant iron staining on bone marrow staining.  Recommend empiric oral iron supplementation Vitron C.  New prescription sent to pharmacy

## 2022-02-07 NOTE — Assessment & Plan Note (Signed)
She has lost 7 pounds since 6 months ago.  She is concerned about decreased appetite. I recommend CT abdomen pelvis for further evaluation.  Patient would like to defer and work on increasing oral intake and nutrition. Continue to monitor.

## 2022-02-07 NOTE — Progress Notes (Signed)
Hematology/Oncology Progress note [phone] [phone]         Patient Care Team: [name], MD as PCP - General (Internal Medicine)   REFERRING PROVIDER: [name], MD  CHIEF COMPLAINTS/REASON FOR VISIT:  Anemia  ASSESSMENT & PLAN:  Normocytic anemia Chronic anemia, with fluctuating levels.  Labs reviewed and discussed with patient.  Normal reticulocyte panel, no immunophenotypic abnormality peripheral blood flow cytometry, normal LDH, normal haptoglobin, normal iron TIBC ferritin, negative ANA, normal TSH, negative CMV, negative EBV, negative parvovirus Her previous workup was negative for iron deficiency, folate deficiency, B12 deficiency.   Bone marrow biopsy results reviewed and discussed with patient.  Normal cellularity with no increased blast.  No significant dysplastic.  Cytogenetics pending. She has scant iron staining on bone marrow staining.  Recommend empiric oral iron supplementation Vitron C.  New prescription sent to pharmacy    Unintentional weight loss She has lost 7 pounds since 6 months ago.  She is concerned about decreased appetite. I recommend CT abdomen pelvis for further evaluation.  Patient would like to defer and work on increasing oral intake and nutrition. Continue to monitor.  Orders Placed This Encounter  Procedures   CBC with Differential/Platelet    Standing Status:   Future    Standing Expiration Date:   02/08/2023   Comprehensive metabolic panel    Standing Status:   Future    Standing Expiration Date:   02/07/2023   Iron and TIBC    Standing Status:   Future    Standing Expiration Date:   02/08/2023   Ferritin    Standing Status:   Future    Standing Expiration Date:   02/08/2023   Retic Panel    Standing Status:   Future    Standing Expiration Date:   02/08/2023   Follow-up  3 months. All questions were answered. The patient knows to call the clinic with any problems, questions or  concerns.  [name], MD, PhD [facility] Hematology Oncology 02/07/2022     HISTORY OF PRESENTING ILLNESS:  Adrienne Robinson is a  66 y.o.  female with PMH listed below who was referred to me for anemia Reviewed patient's recent labs that was done.  Patient has longstanding history of anemia, since at least 2012.  Her counts ranges from 10s -11s. Patient reports feeling tired and fatigued.  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 She denies recent chest pain on exertion, shortness of breath on minimal exertion, pre-syncopal episodes, or palpitations She had not noticed any recent bleeding such as hematuria or hematochezia.   Patient's previous workup includes normal iron panel, negative M protein on protein electrophoresis, random urine showed no monoclonal protein normal erythropoietin level,Normal folate and B12 level, Patient denies any occupational toxin exposure.  Patient is on vitamin B12 injection weekly, managed by primary care provider.  She denies any routine alcohol consumption history.   INTERVAL HISTORY Adrienne Robinson is a 66 y.o. female who has above history reviewed by me today presents for follow up visit for anemia.  During the interval, she has had bone marrow biopsy done and she presents to discuss results.  She has concerns of decreased appetite and unintentional weight loss.  Currently weight is 110 Ib, comparing to 115Ib in September 2023 and 117 Ib in July 2023 No nausea vomiting diarrhea.   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   Prepatellar effusion of right knee 06/09/2019   Aspiration done June 13, 2019   Scoliosis    Spinal stenosis of lumbar region    Squamous cell carcinoma of skin 06/24/2018   R lateral calf   Tubular adenoma of colon     SURGICAL HISTORY: Past Surgical History:  Procedure Laterality Date   ABDOMINAL HYSTERECTOMY     APPENDECTOMY     CARDIAC CATHETERIZATION     normal coronaries, no wall motion abnormalities 11/02/09 [facility])   CATARACT EXTRACTION W/PHACO Left 11/24/2020   Procedure: CATARACT EXTRACTION PHACO AND INTRAOCULAR LENS PLACEMENT ([place]) LEFT 6.09 00:46.7;  Surgeon: [name], MD;  Location: [place];  Service: Ophthalmology;  Laterality: Left;  patient wants early   CATARACT EXTRACTION W/PHACO Right 12/08/2020   Procedure: CATARACT EXTRACTION PHACO AND INTRAOCULAR LENS PLACEMENT ([facility]) RIGHT 3.92 00:49.0;  Surgeon: [name], MD;  Location: [place];  Service: Ophthalmology;  Laterality: Right;  patient request early   NASAL SINUS SURGERY     OOPHORECTOMY     RADIOLOGY WITH ANESTHESIA  02/26/2012   Procedure: RADIOLOGY WITH ANESTHESIA;  Surgeon: [name], MD;  Location: [place];  Service: Radiology;  Laterality: N/A;   REVERSE SHOULDER ARTHROPLASTY Right 06/27/2013   Procedure: REVERSE RIGHT TOTAL SHOULDER ARTHROPLASTY;  Surgeon: [name], MD;  Location: [place];  Service: Orthopedics;  Laterality: Right;   SHOULDER ARTHROSCOPY WITH ROTATOR CUFF REPAIR AND SUBACROMIAL DECOMPRESSION  2007   left   TONSILLECTOMY  adnoids    SOCIAL HISTORY: Social History   Socioeconomic History   Marital status: Married    Spouse name: Not on file   Number of children: 0   Years of education: 18   Highest education level: Not on file  Occupational History    Occupation: Nurse executive   Tobacco Use   Smoking status: Never   Smokeless tobacco: Never  Vaping Use   Vaping Use: Never used  Substance and Sexual Activity   Alcohol use: Yes    Alcohol/week: 2.0 standard drinks of alcohol    Types: 2 Glasses of wine per week    Comment: 2-3 week with dinner   Drug use: No   Sexual activity: Yes  Other Topics Concern   Not on file  Social History Narrative   Lives at home with her husband.   Right-handed.   2-3 diet cokes per day.   Social Determinants of Health   Financial Resource Strain: Not on file  Food Insecurity: Not on file  Transportation Needs: Not on file  Physical Activity: Not on file  Stress: Not on file  Social Connections: Not on file  Intimate Partner Violence: Not on file    FAMILY HISTORY: Family History  Problem Relation Age of Onset   Thyroid disease Mother    Colon cancer Father  83   Diabetes Father    Heart disease Father    Rectal cancer Father    Breast cancer Paternal Grandmother    Colon cancer Paternal Grandfather    Colon cancer Other        pggm   Stomach cancer Neg Hx    Esophageal cancer Neg Hx     ALLERGIES:  is allergic to buspirone, celebrex [celecoxib], adhesive [tape], and morphine and related.  MEDICATIONS:  Current Outpatient Medications  Medication Sig Dispense Refill   amLODipine (NORVASC) 10 MG tablet TAKE 1 TABLET BY MOUTH EVERY DAY 90 tablet 3   buPROPion (WELLBUTRIN XL) 150 MG 24 hr tablet TAKE 1 TABLET BY MOUTH EVERY DAY IN THE MORNING 90 tablet 1   cyanocobalamin (,VITAMIN B-12,) 1000 MCG/ML injection INJECT 1 ML (1,000 MCG TOTAL) INTO THE MUSCLE ONCE A WEEK. (Patient taking differently: Inject 1,000 mcg into the muscle every 30 (thirty) days.) 12 mL 6   esomeprazole (NEXIUM) 20 MG capsule TAKE 1 CAPSULE (20 MG TOTAL) BY MOUTH DAILY. **PLEASE CONTACT OFFICE TO SCHEDULE FOLLOW UP 90 capsule 0   fluticasone (FLONASE) 50 MCG/ACT nasal spray USE 2 SPRAYS IN EACH NOSTRIL DAILY  AS NEEDED 16 g 1   furosemide (LASIX) 20 MG tablet TAKE 1 TABLET (20 MG TOTAL) BY MOUTH DAILY WITH BREAKFAST AS NEEDED FOR EDEMA 90 tablet 1   HYDROcodone-acetaminophen (NORCO/VICODIN) 5-325 MG tablet Take 2 tablets by mouth every 6 (six) hours as needed for moderate pain. 240 tablet 0   LINZESS 145 MCG CAPS capsule TAKE 1 CAPSULE BY MOUTH EVERY DAY BEFORE BREAKFAST 90 capsule 0   metoprolol succinate (TOPROL-XL) 25 MG 24 hr tablet TAKE 1 TABLET BY MOUTH EVERY DAY 90 tablet 1   Multiple Vitamin (MULTIVITAMIN) tablet Take 1 tablet by mouth daily.     propranolol (INDERAL) 10 MG tablet TAKE 1/2 TABLET BY MOUTH AS NEEDED FOR ANXIETY 15 tablet 1   sodium chloride 1 g tablet Take 1 tablet (1 g total) by mouth 2 (two) times daily with a meal. 60 tablet 2   Syringe/Needle, Disp, (SYRINGE 3CC/25GX1") 25G X 1" 3 ML MISC Use for b12 injections 50 each 0   zolpidem (AMBIEN CR) 12.5 MG CR tablet TAKE 1 TABLET BY MOUTH AT BEDTIME AS NEEDED FOR SLEEP. 30 tablet 5   Iron-Vitamin C 65-125 MG TABS Take 1 tablet by mouth daily. 90 tablet 0   No current facility-administered medications for this visit.    Review of Systems  Constitutional:  Positive for fatigue. Negative for appetite change, chills and fever.  HENT:   Negative for hearing loss and voice change.   Eyes:  Negative for eye problems.  Respiratory:  Negative for chest tightness and cough.   Cardiovascular:  Negative for chest pain.  Gastrointestinal:  Negative for abdominal distention, abdominal pain and blood in stool.  Endocrine: Negative for hot flashes.  Genitourinary:  Negative for difficulty urinating and frequency.   Musculoskeletal:  Negative for arthralgias.  Skin:  Negative for itching and rash.  Neurological:  Negative for extremity weakness.  Hematological:  Negative for adenopathy.  Psychiatric/Behavioral:  Negative for confusion.     PHYSICAL EXAMINATION: ECOG PERFORMANCE STATUS: 1 - Symptomatic but completely  ambulatory Vitals:   02/07/22 1034  BP: (!) 140/84  Pulse: 86  Resp: 18  Temp: (!) 97.4 F (36.3 C)   Filed Weights   02/07/22 1034  Weight: 110 lb 6.4 oz (50.1 kg)    Physical Exam Constitutional:  General: She is not in acute distress.    Comments: Thin built   HENT:     Head: Normocephalic.  Eyes:     General: No scleral icterus. Cardiovascular:     Rate and Rhythm: Normal rate.  Pulmonary:     Effort: Pulmonary effort is normal. No respiratory distress.  Abdominal:     General: There is no distension.  Musculoskeletal:        General: Normal range of motion.     Cervical back: Normal range of motion and neck supple.  Skin:    Coloration: Skin is not pale.     Findings: No rash.  Neurological:     Mental Status: She is alert and oriented to person, place, and time. Mental status is at baseline.     Cranial Nerves: No cranial nerve deficit.  Psychiatric:        Mood and Affect: Mood normal.      LABORATORY DATA:  I have reviewed the data as listed    Latest Ref Rng & Units 01/31/2022    8:22 AM 01/16/2022   12:08 PM 11/10/2021   10:43 AM  CBC  WBC 4.0 - 10.5 K/uL 4.2  4.4  3.7   Hemoglobin 12.0 - 15.0 g/dL 10.8  11.7  10.7   Hematocrit 36.0 - 46.0 % 31.6  33.5  31.0   Platelets 150 - 400 K/uL 306  387  309.0       Latest Ref Rng & Units 11/10/2021   10:43 AM 11/04/2021    8:13 AM 06/02/2021    9:00 AM  CMP  Glucose 70 - 99 mg/dL  87  88   BUN 6 - 23 mg/dL  15  15   Creatinine 0.40 - 1.20 mg/dL  0.97  0.97   Sodium 135 - 145 mEq/L  140  136   Potassium 3.5 - 5.1 mEq/L  3.6  3.9   Chloride 96 - 112 mEq/L  103  102   CO2 19 - 32 mEq/L  27  27   Calcium 8.4 - 10.5 mg/dL  9.3  9.0   Total Protein 6.1 - 8.1 g/dL 6.7  6.4  6.2   Total Bilirubin 0.2 - 1.2 mg/dL  0.4  0.5   Alkaline Phos 39 - 117 U/L  70  58   AST 0 - 37 U/L  19  21   ALT 0 - 35 U/L  14  18       Component Value Date/Time   IRON 127 01/16/2022 1208   IRON 152 (H) 04/22/2020 1118    IRON 76 02/07/2012 1518   TIBC 367 01/16/2022 1208   TIBC 339 04/22/2020 1118   TIBC 382 02/07/2012 1518   FERRITIN 83 01/16/2022 1208   FERRITIN 49 02/07/2012 1518   IRONPCTSAT 35 (H) 01/16/2022 1208   IRONPCTSAT 32 08/06/2020 1403     RADIOGRAPHIC STUDIES: I have personally reviewed the radiological images as listed and agreed with the findings in the report. [place] LIMITED JOINT SPACE STRUCTURES UP LEFT(NO LINKED CHARGES)  Result Date: 02/05/2022 Procedure: Real-time Ultrasound Guided Injection of left CMC joint Device: GE Logiq Q7 Ultrasound guided injection is preferred based studies that show increased duration, increased effect, greater accuracy, decreased procedural pain, increased response rate, and decreased cost with ultrasound guided versus blind injection. Verbal informed consent obtained. Time-out conducted. Noted no overlying erythema, induration, or other signs of local infection. Skin prepped in a sterile fashion. Local anesthesia: Topical Ethyl  chloride. With sterile technique and under real time ultrasound guidance: With a 25-gauge half inch needle injected with 0.5 cc of 0.5% Marcaine and 0.5 cc of Kenalog 40 mg/mL. Completed without difficulty Pain immediately resolved suggesting accurate placement of the medication. Advised to call if fevers/chills, erythema, induration, drainage, or persistent bleeding. Impression: Technically successful ultrasound guided injection.   CT BONE MARROW BIOPSY & ASPIRATION  Result Date: 01/31/2022 INDICATION: Anemia. EXAM: CT GUIDED BONE MARROW ASPIRATION AND CORE BIOPSY MEDICATIONS: None. ANESTHESIA/SEDATION: Moderate (conscious) sedation was employed during this procedure. A total of Versed 2 mg and Fentanyl 100 mcg was administered intravenously. Moderate Sedation Time: 22 minutes. The patient's level of consciousness and vital signs were monitored continuously by radiology nursing throughout the procedure under my direct supervision.  FLUOROSCOPY TIME:  CT dose in mGy was not provided. COMPLICATIONS: None immediate. Estimated blood loss: <5 mL PROCEDURE: RADIATION DOSE REDUCTION: This exam was performed according to the departmental dose-optimization program which includes automated exposure control, adjustment of the mA and/or kV according to patient size and/or use of iterative reconstruction technique. Informed written consent was obtained from the the patient and/or patient's representative after a thorough discussion of the procedural risks, benefits and alternatives. All questions were addressed. Maximal Sterile Barrier Technique was utilized including caps, mask, sterile gowns, sterile gloves, sterile drape, hand hygiene and skin antiseptic. A timeout was performed prior to the initiation of the procedure. The patient was positioned prone and non-contrast localization CT was performed of the pelvis to demonstrate the iliac marrow spaces. Maximal barrier sterile technique utilized including caps, mask, sterile gowns, sterile gloves, large sterile drape, hand hygiene, and chlorhexidine prep. Under sterile conditions and local anesthesia, an 11 gauge coaxial bone biopsy needle was advanced into the LEFT iliac marrow space. Needle position was confirmed with CT imaging. Initially, bone marrow aspiration was performed. Next, the 11 gauge outer cannula was utilized to obtain a 2 iliac bone marrow core biopsy. Needle was removed. Hemostasis was obtained with compression. The patient tolerated the procedure well. Samples were prepared with the cytotechnologist. IMPRESSION: Successful CT-guided bone marrow aspiration and biopsy, as above Michaelle Birks, MD [facility] Electronically Signed   By: Michaelle Birks M.D.   On: 01/31/2022 10:12

## 2022-02-08 ENCOUNTER — Encounter (HOSPITAL_COMMUNITY): Payer: Self-pay | Admitting: Oncology

## 2022-02-10 ENCOUNTER — Encounter (HOSPITAL_COMMUNITY): Payer: Self-pay | Admitting: Oncology

## 2022-02-14 ENCOUNTER — Other Ambulatory Visit: Payer: Self-pay | Admitting: Internal Medicine

## 2022-02-14 NOTE — Telephone Encounter (Signed)
FISH testing has been added and reports is scanned in media

## 2022-02-15 ENCOUNTER — Encounter: Payer: Self-pay | Admitting: Nurse Practitioner

## 2022-02-15 ENCOUNTER — Ambulatory Visit (INDEPENDENT_AMBULATORY_CARE_PROVIDER_SITE_OTHER): Payer: Managed Care, Other (non HMO)

## 2022-02-15 ENCOUNTER — Ambulatory Visit (INDEPENDENT_AMBULATORY_CARE_PROVIDER_SITE_OTHER): Payer: Managed Care, Other (non HMO) | Admitting: Nurse Practitioner

## 2022-02-15 VITALS — BP 112/70 | HR 72 | Temp 98.0°F | Wt 110.4 lb

## 2022-02-15 DIAGNOSIS — J329 Chronic sinusitis, unspecified: Secondary | ICD-10-CM | POA: Diagnosis not present

## 2022-02-15 DIAGNOSIS — R0781 Pleurodynia: Secondary | ICD-10-CM | POA: Diagnosis not present

## 2022-02-15 MED ORDER — AMOXICILLIN 500 MG PO CAPS: 500.0000 mg | ORAL_CAPSULE | Freq: Two times a day (BID) | ORAL | 0 refills | Status: AC

## 2022-02-15 NOTE — Telephone Encounter (Signed)
I spoke with patient in office today to discuss Prolia benefits & oop cost. Pt is aware of the amount due if she decides to proceed. (Will still need PA as well)  Pt wants to read up on Prolia a little more & will get back with [place] to let us know if she will proceed or decline.

## 2022-02-15 NOTE — Progress Notes (Signed)
Adrienne Morrow, NP-C Phone: [phone]  Adrienne Robinson is a 66 y.o. female who presents today for drainage and cough x 2 weeks. Recently switched nasal sprays with no relief in symptoms. Reports frontal sinus pain and pressure. Denies any ear pain or headaches.   Respiratory illness:  Cough- yes  Congestion-    Sinus- yes   Chest- no  Post nasal drip- yes  Sore throat- no  Shortness of breath- no  Fever-no  Taste disturbance- no  Smell disturbance- no  Covid vaccination- x 4  Covid booster- due  Medications- OTC nasal spray and Mucinex BID  Left side rib pain- Patient reports 2 days ago husband was applying massage like pressure to her back due to arthritis pain and she has had rib tenderness since. She reports left axillary rib tenderness. Denies pain with breathing or shortness of breath. Denies any pain with left arm movements. She is concerned she may have fractured a rib.   Social History   Tobacco Use  Smoking Status Never  Smokeless Tobacco Never    Current Outpatient Medications on File Prior to Visit  Medication Sig Dispense Refill   amLODipine (NORVASC) 10 MG tablet TAKE 1 TABLET BY MOUTH EVERY DAY 90 tablet 3   buPROPion (WELLBUTRIN XL) 150 MG 24 hr tablet TAKE 1 TABLET BY MOUTH EVERY DAY IN THE MORNING 90 tablet 1   cyanocobalamin (,VITAMIN B-12,) 1000 MCG/ML injection INJECT 1 ML (1,000 MCG TOTAL) INTO THE MUSCLE ONCE A WEEK. (Patient taking differently: Inject 1,000 mcg into the muscle every 30 (thirty) days.) 12 mL 6   esomeprazole (NEXIUM) 20 MG capsule TAKE 1 CAPSULE (20 MG TOTAL) BY MOUTH DAILY. **PLEASE CONTACT OFFICE TO SCHEDULE FOLLOW UP 90 capsule 0   fluticasone (FLONASE) 50 MCG/ACT nasal spray USE 2 SPRAYS IN EACH NOSTRIL DAILY AS NEEDED 16 g 1   furosemide (LASIX) 20 MG tablet TAKE 1 TABLET (20 MG TOTAL) BY MOUTH DAILY WITH BREAKFAST AS NEEDED FOR EDEMA 90 tablet 1   HYDROcodone-acetaminophen (NORCO/VICODIN) 5-325 MG tablet Take 2 tablets by mouth every  6 (six) hours as needed for moderate pain. 240 tablet 0   Iron-Vitamin C 65-125 MG TABS Take 1 tablet by mouth daily. 90 tablet 0   LINZESS 145 MCG CAPS capsule TAKE 1 CAPSULE BY MOUTH EVERY DAY BEFORE BREAKFAST 90 capsule 0   metoprolol succinate (TOPROL-XL) 25 MG 24 hr tablet TAKE 1 TABLET BY MOUTH EVERY DAY 90 tablet 1   Multiple Vitamin (MULTIVITAMIN) tablet Take 1 tablet by mouth daily.     propranolol (INDERAL) 10 MG tablet TAKE 1/2 TABLET BY MOUTH AS NEEDED FOR ANXIETY 15 tablet 1   sodium chloride 1 g tablet Take 1 tablet (1 g total) by mouth 2 (two) times daily with a meal. 60 tablet 2   Syringe/Needle, Disp, (SYRINGE 3CC/25GX1") 25G X 1" 3 ML MISC Use for b12 injections 50 each 0   zolpidem (AMBIEN CR) 12.5 MG CR tablet TAKE 1 TABLET BY MOUTH AT BEDTIME AS NEEDED FOR SLEEP. 30 tablet 5   No current facility-administered medications on file prior to visit.     ROS see history of present illness  Objective  Physical Exam Vitals:   02/15/22 1508  BP: 112/70  Pulse: 72  Temp: 98 F (36.7 C)  SpO2: 98%    BP Readings from Last 3 Encounters:  02/15/22 112/70  02/07/22 (!) 140/84  02/06/22 (!) 142/85   Wt Readings from Last 3 Encounters:  02/15/22 110  lb 6.4 oz (50.1 kg)  02/07/22 110 lb 6.4 oz (50.1 kg)  01/31/22 112 lb (50.8 kg)    Physical Exam Constitutional:      General: She is not in acute distress.    Appearance: Normal appearance.  HENT:     Head: Normocephalic.     Right Ear: Tympanic membrane normal.     Left Ear: Tympanic membrane normal.     Nose: Nose normal. No congestion or rhinorrhea.     Comments: Frontal sinuses tender to palpation    Mouth/Throat:     Mouth: Mucous membranes are moist.     Pharynx: Oropharynx is clear. No oropharyngeal exudate or posterior oropharyngeal erythema.  Eyes:     Conjunctiva/sclera: Conjunctivae normal.     Pupils: Pupils are equal, round, and reactive to light.  Cardiovascular:     Rate and Rhythm: Normal rate  and regular rhythm.     Heart sounds: Normal heart sounds.  Pulmonary:     Effort: Pulmonary effort is normal. No respiratory distress.     Breath sounds: Normal breath sounds.  Musculoskeletal:        General: Tenderness (left side, axillary ribs tender to palpation.) present. No swelling, deformity or signs of injury. Normal range of motion.  Neurological:     Mental Status: She is alert.  Psychiatric:        Mood and Affect: Mood normal.        Behavior: Behavior normal.    Assessment/Plan: Please see individual problem list.  Sinusitis, unspecified chronicity, unspecified location Assessment & Plan: Given duration of symptoms will treat with Amoxicillin '500mg'$  BID x 10 days. Encouraged adequate fluid intake and advised that she can continue nasal spray as needed.   Orders: -     Amoxicillin; Take 1 capsule (500 mg total) by mouth 2 (two) times daily for 10 days.  Dispense: 20 capsule; Refill: 0  Rib pain on left side Assessment & Plan: Will obtain xray to rule out rib fracture. Will contact patient with results. Discussed more than likely muscular pain. Advised ice/heat and NSAIDs. Return to care if not improving.   Orders: -     DG Ribs Unilateral Left    Return if symptoms worsen or fail to improve.   Adrienne Morrow, NP-C [place]

## 2022-02-15 NOTE — Assessment & Plan Note (Addendum)
Will obtain xray to rule out rib fracture. Will contact patient with results. Discussed more than likely muscular pain. Advised ice/heat and NSAIDs. Return to care if not improving.

## 2022-02-15 NOTE — Assessment & Plan Note (Addendum)
Given duration of symptoms will treat with Amoxicillin '500mg'$  BID x 10 days. Encouraged adequate fluid intake and advised that she can continue nasal spray as needed.

## 2022-02-16 ENCOUNTER — Encounter: Payer: Self-pay | Admitting: Internal Medicine

## 2022-02-17 ENCOUNTER — Telehealth: Payer: Self-pay

## 2022-02-17 MED ORDER — HYDROCODONE-ACETAMINOPHEN 5-325 MG PO TABS: 2.0000 | ORAL_TABLET | Freq: Four times a day (QID) | ORAL | 0 refills | Status: DC | PRN

## 2022-02-17 NOTE — Telephone Encounter (Signed)
Pt stated she does not want to do the Prolia at this time until she does more research.   Prolia chart Archived

## 2022-02-17 NOTE — Telephone Encounter (Signed)
LMOM to CB in regards to XRAY

## 2022-03-02 NOTE — Progress Notes (Deleted)
Adrienne Robinson Phone: [phone] Subjective:    I'm seeing this patient by the request  of:  [name], MD  CC:   [id number]  New problem  Discussed HEP  Discussed may need xray  RTC in 6 weeks    Chronic problem with exacerbation.  Discussed icing regimen and home exercises.  Discussed which activities to do and which ones to avoid have responded extremely well and feels like she responds better to this in the epidurals.  Follow-up with me again in 6 to 8 weeks     Repeat injection given again today  Discussed HEP  Discussed the possible need for surgical intervention at some point.  Patient wants to hold at this time.  Will follow-up with me again in 8 to 10 weeks      Update 03/08/2022 Adrienne Robinson is a 67 y.o. female coming in with complaint of L CMC, L shoulder, and R knee pain. 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   Prepatellar effusion of right knee 06/09/2019   Aspiration done June 13, 2019   Scoliosis    Spinal stenosis of lumbar region    Squamous cell carcinoma of skin 06/24/2018   R lateral calf   Tubular adenoma of colon    Past Surgical History:  Procedure Laterality Date   ABDOMINAL HYSTERECTOMY     APPENDECTOMY     CARDIAC CATHETERIZATION     normal coronaries, no wall motion abnormalities 11/02/09 [facility])    CATARACT EXTRACTION W/PHACO Left 11/24/2020   Procedure: CATARACT EXTRACTION PHACO AND INTRAOCULAR LENS PLACEMENT ([place]) LEFT 6.09 00:46.7;  Surgeon: [name], MD;  Location: [place];  Service: Ophthalmology;  Laterality: Left;  patient wants early   CATARACT EXTRACTION W/PHACO Right 12/08/2020   Procedure: CATARACT EXTRACTION PHACO AND INTRAOCULAR LENS PLACEMENT ([facility]) RIGHT 3.92 00:49.0;  Surgeon: [name], MD;  Location: [place];  Service: Ophthalmology;  Laterality: Right;  patient request early   NASAL SINUS SURGERY     OOPHORECTOMY     RADIOLOGY WITH ANESTHESIA  02/26/2012   Procedure: RADIOLOGY WITH ANESTHESIA;  Surgeon: [name], MD;  Location: [place];  Service: Radiology;  Laterality: N/A;   REVERSE SHOULDER ARTHROPLASTY Right 06/27/2013   Procedure: REVERSE RIGHT TOTAL SHOULDER ARTHROPLASTY;  Surgeon: [name], MD;  Location: [place];  Service: Orthopedics;  Laterality: Right;   SHOULDER ARTHROSCOPY WITH ROTATOR CUFF REPAIR AND SUBACROMIAL DECOMPRESSION  2007   left   TONSILLECTOMY  adnoids   Social History   Socioeconomic History   Marital status: Married    Spouse name: Not on file   Number of children: 0   Years of education: 18   Highest education level: Not on file  Occupational History   Occupation: Warden/ranger   Tobacco Use   Smoking status: Never   Smokeless tobacco: Never  Vaping Use   Vaping Use: Never used  Substance and Sexual Activity   Alcohol use: Yes    Alcohol/week: 2.0 standard drinks of alcohol    Types: 2 Glasses of wine per week    Comment: 2-3 week with dinner   Drug use: No   Sexual activity: Yes  Other Topics Concern   Not on file  Social History Narrative   Lives at home with her husband.   Right-handed.   2-3 diet cokes per day.   Social Determinants of Health   Financial Resource Strain: Not on file  Food Insecurity: Not on file  Transportation Needs: Not on file  Physical  Activity: Not on file  Stress: Not on file  Social Connections: Not on file   Allergies  Allergen Reactions   Buspirone Other (See Comments)    Dizziness,  dry mouth    Celebrex [Celecoxib] Swelling   Adhesive [Tape] Rash and Other (See Comments)    Redness, blisters, skin peeling off.   Morphine And Related Rash   Family History  Problem Relation Age of Onset   Thyroid disease Mother    Colon cancer Father 49   Diabetes Father    Heart disease Father    Rectal cancer Father    Breast cancer Paternal Grandmother    Colon cancer Paternal Grandfather    Colon cancer Other        pggm   Stomach cancer Neg Hx    Esophageal cancer Neg Hx      Current Outpatient Medications (Cardiovascular):    amLODipine (NORVASC) 10 MG tablet, TAKE 1 TABLET BY MOUTH EVERY DAY   furosemide (LASIX) 20 MG tablet, TAKE 1 TABLET (20 MG TOTAL) BY MOUTH DAILY WITH BREAKFAST AS NEEDED FOR EDEMA   metoprolol succinate (TOPROL-XL) 25 MG 24 hr tablet, TAKE 1 TABLET BY MOUTH EVERY DAY   propranolol (INDERAL) 10 MG tablet, TAKE 1/2 TABLET BY MOUTH AS NEEDED FOR ANXIETY  Current Outpatient Medications (Respiratory):    fluticasone (FLONASE) 50 MCG/ACT nasal spray, USE 2 SPRAYS IN EACH NOSTRIL DAILY AS NEEDED  Current Outpatient Medications (Analgesics):    HYDROcodone-acetaminophen (NORCO/VICODIN) 5-325 MG tablet, Take 2 tablets by mouth every 6 (six) hours as needed for moderate pain.  Current Outpatient Medications (Hematological):    cyanocobalamin (,VITAMIN B-12,) 1000 MCG/ML injection, INJECT 1 ML (1,000 MCG TOTAL) INTO THE MUSCLE ONCE A WEEK. (Patient taking differently: Inject 1,000 mcg into the muscle every 30 (thirty) days.)   Iron-Vitamin C 65-125 MG TABS, Take 1 tablet by mouth daily.  Current Outpatient Medications (Other):    buPROPion (WELLBUTRIN XL) 150 MG 24 hr tablet, TAKE 1 TABLET BY MOUTH EVERY DAY IN THE MORNING   esomeprazole (NEXIUM) 20 MG capsule, TAKE 1 CAPSULE (20 MG TOTAL) BY  MOUTH DAILY. **PLEASE CONTACT OFFICE TO SCHEDULE FOLLOW UP   LINZESS 145 MCG CAPS capsule, TAKE 1 CAPSULE BY MOUTH EVERY DAY BEFORE BREAKFAST   Multiple Vitamin (MULTIVITAMIN) tablet, Take 1 tablet by mouth daily.   sodium chloride 1 g tablet, Take 1 tablet (1 g total) by mouth 2 (two) times daily with a meal.   Syringe/Needle, Disp, (SYRINGE 3CC/25GX1") 25G X 1" 3 ML MISC, Use for b12 injections   zolpidem (AMBIEN CR) 12.5 MG CR tablet, TAKE 1 TABLET BY MOUTH AT BEDTIME AS NEEDED FOR SLEEP.   Reviewed prior  external information including notes and imaging from  primary care provider As well as notes that were available from care everywhere and other healthcare systems.  Past medical history, social, surgical and family history all reviewed in electronic medical record.  No pertanent information unless stated regarding to the chief complaint.   Review of Systems:  No headache, visual changes, nausea, vomiting, diarrhea, constipation, dizziness, abdominal pain, skin rash, fevers, chills, night sweats, weight loss, swollen lymph nodes, body aches, joint swelling, chest pain, shortness of breath, mood changes. POSITIVE muscle aches  Objective  There were no vitals taken for this visit.   General: No apparent distress alert and oriented x3 mood and affect normal, dressed appropriately.  HEENT: Pupils equal, extraocular movements intact  Respiratory: Patient's speak in full sentences and does not appear short of breath  Cardiovascular: No lower extremity edema, non tender, no erythema      Impression and Recommendations:

## 2022-03-06 ENCOUNTER — Ambulatory Visit: Payer: Managed Care, Other (non HMO) | Admitting: Dermatology

## 2022-03-08 ENCOUNTER — Ambulatory Visit: Payer: Managed Care, Other (non HMO) | Admitting: Family Medicine

## 2022-03-15 ENCOUNTER — Ambulatory Visit: Payer: Managed Care, Other (non HMO) | Admitting: Dermatology

## 2022-03-16 ENCOUNTER — Encounter: Payer: Self-pay | Admitting: Internal Medicine

## 2022-03-17 MED ORDER — HYDROCODONE-ACETAMINOPHEN 5-325 MG PO TABS: 2.0000 | ORAL_TABLET | Freq: Four times a day (QID) | ORAL | 0 refills | Status: DC | PRN

## 2022-03-20 ENCOUNTER — Encounter: Payer: Self-pay | Admitting: Internal Medicine

## 2022-03-20 MED ORDER — FAMCICLOVIR 500 MG PO TABS: 500.0000 mg | ORAL_TABLET | Freq: Three times a day (TID) | ORAL | 2 refills | Status: DC

## 2022-03-21 ENCOUNTER — Other Ambulatory Visit: Payer: Self-pay | Admitting: Internal Medicine

## 2022-03-29 NOTE — Progress Notes (Deleted)
[name]: [phone] Subjective:    I'm seeing this patient by the request  of:  [name], MD  CC:   [id number]  01/18/2022 New problem  Discussed HEP  Discussed may need xray  RTC in 6 weeks  Chronic problem with exacerbation.  Discussed icing regimen and home exercises.  Discussed which activities to do and which ones to avoid have responded extremely well and feels like she responds better to this in the epidurals.  Follow-up with me again in 6 to 8 weeks   Repeat injection given again today  Discussed HEP  Discussed the possible need for surgical intervention at some point.  Patient wants to hold at this time.  Will follow-up with me again in 8 to 10 weeks     Updated 03/31/2022 Adrienne Robinson is a 67 y.o. 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   Prepatellar effusion of right knee 06/09/2019   Aspiration done June 13, 2019   Scoliosis    Spinal stenosis of lumbar region    Squamous cell carcinoma of skin 06/24/2018   R lateral calf   Tubular adenoma of colon    Past Surgical History:  Procedure Laterality Date   ABDOMINAL HYSTERECTOMY     APPENDECTOMY     CARDIAC CATHETERIZATION     normal coronaries, no wall motion abnormalities 11/02/09 [name])   CATARACT EXTRACTION  W/PHACO Left 11/24/2020   Procedure: CATARACT EXTRACTION PHACO AND INTRAOCULAR LENS PLACEMENT ([place]) LEFT 6.09 00:46.7;  Surgeon: [name], MD;  Location: [place];  Service: Ophthalmology;  Laterality: Left;  patient wants early   CATARACT EXTRACTION W/PHACO Right 12/08/2020   Procedure: CATARACT EXTRACTION PHACO AND INTRAOCULAR LENS PLACEMENT ([facility]) RIGHT 3.92 00:49.0;  Surgeon: [name], MD;  Location: [place];  Service: Ophthalmology;  Laterality: Right;  patient request early   NASAL SINUS SURGERY     OOPHORECTOMY     RADIOLOGY WITH ANESTHESIA  02/26/2012   Procedure: RADIOLOGY WITH ANESTHESIA;  Surgeon: [name], MD;  Location: [place];  Service: Radiology;  Laterality: N/A;   REVERSE SHOULDER ARTHROPLASTY Right 06/27/2013   Procedure: REVERSE RIGHT TOTAL SHOULDER ARTHROPLASTY;  Surgeon: [name], MD;  Location: [place];  Service: Orthopedics;  Laterality: Right;   SHOULDER ARTHROSCOPY WITH ROTATOR CUFF REPAIR AND SUBACROMIAL DECOMPRESSION  2007   left   TONSILLECTOMY  adnoids   Social History   Socioeconomic History   Marital status: Married    Spouse name: Not on file   Number of children: 0   Years of education: 18   Highest education level: Not on file  Occupational History   Occupation: Warden/ranger  Tobacco Use   Smoking status: Never   Smokeless tobacco: Never  Vaping Use   Vaping Use: Never used  Substance and Sexual Activity   Alcohol use: Yes    Alcohol/week: 2.0 standard drinks of alcohol    Types: 2 Glasses of wine per week    Comment: 2-3 week with dinner   Drug use: No   Sexual activity: Yes  Other Topics Concern   Not on file  Social History Narrative   Lives at home with her husband.   Right-handed.   2-3 diet cokes per day.   Social Determinants of Health   Financial Resource Strain: Not on file  Food Insecurity: Not on file  Transportation Needs: Not on file  Physical Activity: Not on  file  Stress: Not on file  Social Connections: Not on file   Allergies  Allergen Reactions   Buspirone Other (See Comments)    Dizziness,  dry mouth    Celebrex [Celecoxib] Swelling   Adhesive [Tape] Rash and Other (See Comments)    Redness, blisters, skin peeling off.   Morphine And Related Rash   Family History  Problem Relation Age of Onset   Thyroid disease Mother    Colon cancer Father 32   Diabetes Father    Heart disease Father    Rectal cancer Father    Breast cancer Paternal Grandmother    Colon cancer Paternal Grandfather    Colon cancer Other        pggm   Stomach cancer Neg Hx    Esophageal cancer Neg Hx      Current Outpatient Medications (Cardiovascular):    amLODipine (NORVASC) 10 MG tablet, TAKE 1 TABLET BY MOUTH EVERY DAY   furosemide (LASIX) 20 MG tablet, TAKE 1 TABLET (20 MG TOTAL) BY MOUTH DAILY WITH BREAKFAST AS NEEDED FOR EDEMA   metoprolol succinate (TOPROL-XL) 25 MG 24 hr tablet, TAKE 1 TABLET BY MOUTH EVERY DAY   propranolol (INDERAL) 10 MG tablet, TAKE 1/2 TABLET BY MOUTH AS NEEDED FOR ANXIETY  Current Outpatient Medications (Respiratory):    fluticasone (FLONASE) 50 MCG/ACT nasal spray, USE 2 SPRAYS IN EACH NOSTRIL DAILY AS NEEDED  Current Outpatient Medications (Analgesics):    HYDROcodone-acetaminophen (NORCO/VICODIN) 5-325 MG tablet, Take 2 tablets by mouth every 6 (six) hours as needed for moderate pain.  Current Outpatient Medications (Hematological):    cyanocobalamin (,VITAMIN B-12,) 1000 MCG/ML injection, INJECT 1 ML (1,000 MCG TOTAL) INTO THE MUSCLE ONCE A WEEK. (Patient taking differently: Inject 1,000 mcg into the muscle every 30 (thirty) days.)   Iron-Vitamin C 65-125 MG TABS, Take 1 tablet by mouth daily.  Current Outpatient Medications (Other):    buPROPion (WELLBUTRIN XL) 150 MG 24 hr tablet, TAKE 1 TABLET BY MOUTH EVERY DAY IN THE MORNING   esomeprazole (NEXIUM) 20 MG capsule, TAKE 1 CAPSULE (20 MG TOTAL) BY MOUTH DAILY.  **PLEASE CONTACT OFFICE TO SCHEDULE FOLLOW UP   famciclovir (FAMVIR) 500 MG tablet, Take 1 tablet (500 mg total) by mouth 3 (three) times daily. For one week   LINZESS 145 MCG CAPS capsule, TAKE 1 CAPSULE BY MOUTH EVERY DAY BEFORE BREAKFAST   Multiple Vitamin (MULTIVITAMIN) tablet, Take 1 tablet by mouth daily.   sodium chloride 1 g tablet, Take 1 tablet (1 g total) by mouth 2 (two) times daily with a meal.   Syringe/Needle, Disp, (SYRINGE 3CC/25GX1") 25G X 1" 3 ML MISC, Use for b12 injections   zolpidem (AMBIEN CR) 12.5 MG CR tablet, TAKE 1  TABLET BY MOUTH AT BEDTIME AS NEEDED FOR SLEEP.   Reviewed prior external information including notes and imaging from  primary care provider As well as notes that were available from care everywhere and other healthcare systems.  Past medical history, social, surgical and family history all reviewed in electronic medical record.  No pertanent information unless stated regarding to the chief complaint.   Review of Systems:  No headache, visual changes, nausea, vomiting, diarrhea, constipation, dizziness, abdominal pain, skin rash, fevers, chills, night sweats, weight loss, swollen lymph nodes, body aches, joint swelling, chest pain, shortness of breath, mood changes. POSITIVE muscle aches  Objective  There were no vitals taken for this visit.   General: No apparent distress alert and oriented x3 mood and affect normal, dressed appropriately.  HEENT: Pupils equal, extraocular movements intact  Respiratory: Patient's speak in full sentences and does not appear short of breath  Cardiovascular: No lower extremity edema, non tender, no erythema      Impression and Recommendations:

## 2022-03-31 ENCOUNTER — Ambulatory Visit: Payer: Managed Care, Other (non HMO) | Admitting: Family Medicine

## 2022-04-18 ENCOUNTER — Encounter: Payer: Self-pay | Admitting: Internal Medicine

## 2022-04-18 ENCOUNTER — Other Ambulatory Visit: Payer: Self-pay | Admitting: Internal Medicine

## 2022-04-18 MED ORDER — HYDROCODONE-ACETAMINOPHEN 5-325 MG PO TABS: 2.0000 | ORAL_TABLET | Freq: Four times a day (QID) | ORAL | 0 refills | Status: DC | PRN

## 2022-04-27 ENCOUNTER — Ambulatory Visit: Payer: Managed Care, Other (non HMO) | Admitting: Family Medicine

## 2022-05-02 ENCOUNTER — Ambulatory Visit: Payer: Managed Care, Other (non HMO) | Admitting: Dermatology

## 2022-05-09 ENCOUNTER — Inpatient Hospital Stay: Payer: Managed Care, Other (non HMO) | Admitting: Oncology

## 2022-05-09 ENCOUNTER — Inpatient Hospital Stay: Payer: Managed Care, Other (non HMO)

## 2022-05-10 ENCOUNTER — Ambulatory Visit: Payer: Managed Care, Other (non HMO) | Admitting: Dermatology

## 2022-05-10 NOTE — Progress Notes (Deleted)
[place] Phone: [phone] Subjective:    I'm seeing this patient by the request  of:  [name], MD  CC:   [id number]  01/18/2022 New problem  Discussed HEP  Discussed may need xray  RTC in 6 weeks  Chronic problem with exacerbation. Discussed icing regimen and home exercises. Discussed which activities to do and which ones to avoid have responded extremely well and feels like she responds better to this in the epidurals. Follow-up with me again in 6 to 8 weeks   Repeat injection given again today  Discussed HEP  Discussed the possible need for surgical intervention at some point.  Patient wants to hold at this time.  Will follow-up with me again in 8 to 10 weeks  Updated 05/11/2022 Adrienne Robinson is a 67 y.o. 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   Prepatellar effusion of right knee 06/09/2019   Aspiration done June 13, 2019   Scoliosis    Spinal stenosis of lumbar region    Squamous cell carcinoma of skin 06/24/2018   R lateral calf   Tubular adenoma of colon    Past Surgical History:  Procedure Laterality Date   ABDOMINAL HYSTERECTOMY     APPENDECTOMY      CARDIAC CATHETERIZATION     normal coronaries, no wall motion abnormalities 11/02/09 [facility])   CATARACT EXTRACTION W/PHACO Left 11/24/2020   Procedure: CATARACT EXTRACTION PHACO AND INTRAOCULAR LENS PLACEMENT ([place]) LEFT 6.09 00:46.7;  Surgeon: [name], MD;  Location: [place];  Service: Ophthalmology;  Laterality: Left;  patient wants early   CATARACT EXTRACTION W/PHACO Right 12/08/2020   Procedure: CATARACT EXTRACTION PHACO AND INTRAOCULAR LENS PLACEMENT ([facility]) RIGHT 3.92 00:49.0;  Surgeon: [name], MD;  Location: [place];  Service: Ophthalmology;  Laterality: Right;  patient request early   NASAL SINUS SURGERY     OOPHORECTOMY     RADIOLOGY WITH ANESTHESIA  02/26/2012   Procedure: RADIOLOGY WITH ANESTHESIA;  Surgeon: [name], MD;  Location: [place];  Service: Radiology;  Laterality: N/A;   REVERSE SHOULDER ARTHROPLASTY Right 06/27/2013   Procedure: REVERSE RIGHT TOTAL SHOULDER ARTHROPLASTY;  Surgeon: [name], MD;  Location: [place];  Service: Orthopedics;  Laterality: Right;   SHOULDER ARTHROSCOPY WITH ROTATOR CUFF REPAIR AND SUBACROMIAL DECOMPRESSION  2007   left   TONSILLECTOMY  adnoids   Social History   Socioeconomic History   Marital status: Married    Spouse name: Not on file   Number of children: 0   Years of education: 18   Highest education level: Not on  file  Occupational History   Occupation: Nurse executive   Tobacco Use   Smoking status: Never   Smokeless tobacco: Never  Vaping Use   Vaping Use: Never used  Substance and Sexual Activity   Alcohol use: Yes    Alcohol/week: 2.0 standard drinks of alcohol    Types: 2 Glasses of wine per week    Comment: 2-3 week with dinner   Drug use: No   Sexual activity: Yes  Other Topics Concern   Not on file  Social History Narrative   Lives at home with her husband.   Right-handed.   2-3 diet cokes per day.   Social Determinants of Health   Financial Resource  Strain: Not on file  Food Insecurity: Not on file  Transportation Needs: Not on file  Physical Activity: Not on file  Stress: Not on file  Social Connections: Not on file   Allergies  Allergen Reactions   Buspirone Other (See Comments)    Dizziness,  dry mouth    Celebrex [Celecoxib] Swelling   Adhesive [Tape] Rash and Other (See Comments)    Redness, blisters, skin peeling off.   Morphine And Related Rash   Family History  Problem Relation Age of Onset   Thyroid disease Mother    Colon cancer Father 77   Diabetes Father    Heart disease Father    Rectal cancer Father    Breast cancer Paternal Grandmother    Colon cancer Paternal Grandfather    Colon cancer Other        pggm   Stomach cancer Neg Hx    Esophageal cancer Neg Hx      Current Outpatient Medications (Cardiovascular):    amLODipine (NORVASC) 10 MG tablet, TAKE 1 TABLET BY MOUTH EVERY DAY   furosemide (LASIX) 20 MG tablet, TAKE 1 TABLET (20 MG TOTAL) BY MOUTH DAILY WITH BREAKFAST AS NEEDED FOR EDEMA   metoprolol succinate (TOPROL-XL) 25 MG 24 hr tablet, TAKE 1 TABLET BY MOUTH EVERY DAY   propranolol (INDERAL) 10 MG tablet, TAKE 1/2 TABLET BY MOUTH AS NEEDED FOR ANXIETY  Current Outpatient Medications (Respiratory):    fluticasone (FLONASE) 50 MCG/ACT nasal spray, USE 2 SPRAYS IN EACH NOSTRIL DAILY AS NEEDED  Current Outpatient Medications (Analgesics):    HYDROcodone-acetaminophen (NORCO/VICODIN) 5-325 MG tablet, Take 2 tablets by mouth every 6 (six) hours as needed for moderate pain.  Current Outpatient Medications (Hematological):    cyanocobalamin (,VITAMIN B-12,) 1000 MCG/ML injection, INJECT 1 ML (1,000 MCG TOTAL) INTO THE MUSCLE ONCE A WEEK. (Patient taking differently: Inject 1,000 mcg into the muscle every 30 (thirty) days.)   Iron-Vitamin C 65-125 MG TABS, Take 1 tablet by mouth daily.  Current Outpatient Medications (Other):    buPROPion (WELLBUTRIN XL) 150 MG 24 hr tablet, TAKE 1 TABLET BY MOUTH  EVERY DAY IN THE MORNING   esomeprazole (NEXIUM) 20 MG capsule, TAKE 1 CAPSULE (20 MG TOTAL) BY MOUTH DAILY. **PLEASE CONTACT OFFICE TO SCHEDULE FOLLOW UP   famciclovir (FAMVIR) 500 MG tablet, Take 1 tablet (500 mg total) by mouth 3 (three) times daily. For one week   LINZESS 145 MCG CAPS capsule, TAKE 1 CAPSULE BY MOUTH EVERY DAY BEFORE BREAKFAST   Multiple Vitamin (MULTIVITAMIN) tablet, Take 1 tablet by mouth daily.   sodium chloride 1 g tablet, Take 1 tablet (1 g total) by mouth 2 (two) times daily with a meal.   Syringe/Needle, Disp, (SYRINGE 3CC/25GX1") 25G X 1" 3 ML MISC, Use for b12 injections  zolpidem (AMBIEN CR) 12.5 MG CR tablet, TAKE 1 TABLET BY MOUTH AT BEDTIME AS NEEDED FOR SLEEP.   Reviewed prior external information including notes and imaging from  primary care provider As well as notes that were available from care everywhere and other healthcare systems.  Past medical history, social, surgical and family history all reviewed in electronic medical record.  No pertanent information unless stated regarding to the chief complaint.   Review of Systems:  No headache, visual changes, nausea, vomiting, diarrhea, constipation, dizziness, abdominal pain, skin rash, fevers, chills, night sweats, weight loss, swollen lymph nodes, body aches, joint swelling, chest pain, shortness of breath, mood changes. POSITIVE muscle aches  Objective  There were no vitals taken for this visit.   General: No apparent distress alert and oriented x3 mood and affect normal, dressed appropriately.  HEENT: Pupils equal, extraocular movements intact  Respiratory: Patient's speak in full sentences and does not appear short of breath  Cardiovascular: No lower extremity edema, non tender, no erythema      Impression and Recommendations:

## 2022-05-11 ENCOUNTER — Ambulatory Visit: Payer: Managed Care, Other (non HMO) | Admitting: Family Medicine

## 2022-05-15 ENCOUNTER — Encounter: Payer: Self-pay | Admitting: Internal Medicine

## 2022-05-15 MED ORDER — ZOLPIDEM TARTRATE ER 12.5 MG PO TBCR: 12.5000 mg | EXTENDED_RELEASE_TABLET | Freq: Every evening | ORAL | 5 refills | Status: DC | PRN

## 2022-05-15 MED ORDER — HYDROCODONE-ACETAMINOPHEN 5-325 MG PO TABS: 2.0000 | ORAL_TABLET | Freq: Four times a day (QID) | ORAL | 0 refills | Status: DC | PRN

## 2022-05-16 ENCOUNTER — Inpatient Hospital Stay: Payer: Managed Care, Other (non HMO)

## 2022-05-16 ENCOUNTER — Inpatient Hospital Stay: Payer: Managed Care, Other (non HMO) | Admitting: Oncology

## 2022-05-23 ENCOUNTER — Encounter: Payer: Self-pay | Admitting: Internal Medicine

## 2022-05-23 ENCOUNTER — Ambulatory Visit (INDEPENDENT_AMBULATORY_CARE_PROVIDER_SITE_OTHER): Payer: Managed Care, Other (non HMO) | Admitting: Internal Medicine

## 2022-05-23 VITALS — BP 118/66 | HR 73 | Temp 98.3°F | Ht 64.0 in | Wt 108.4 lb

## 2022-05-23 DIAGNOSIS — F419 Anxiety disorder, unspecified: Secondary | ICD-10-CM

## 2022-05-23 DIAGNOSIS — E871 Hypo-osmolality and hyponatremia: Secondary | ICD-10-CM

## 2022-05-23 DIAGNOSIS — M810 Age-related osteoporosis without current pathological fracture: Secondary | ICD-10-CM | POA: Diagnosis not present

## 2022-05-23 DIAGNOSIS — R634 Abnormal weight loss: Secondary | ICD-10-CM

## 2022-05-23 DIAGNOSIS — F32A Depression, unspecified: Secondary | ICD-10-CM

## 2022-05-23 DIAGNOSIS — G894 Chronic pain syndrome: Secondary | ICD-10-CM

## 2022-05-23 LAB — BASIC METABOLIC PANEL
BUN: 12 mg/dL (ref 6–23)
CO2: 26 mEq/L (ref 19–32)
Calcium: 9.5 mg/dL (ref 8.4–10.5)
Chloride: 100 mEq/L (ref 96–112)
Creatinine, Ser: 1 mg/dL (ref 0.40–1.20)
GFR: 58.76 mL/min — ABNORMAL LOW (ref >60.00)
Glucose, Bld: 130 mg/dL — ABNORMAL HIGH (ref 70–99)
Potassium: 3.4 mEq/L — ABNORMAL LOW (ref 3.5–5.1)
Sodium: 135 mEq/L (ref 135–145)

## 2022-05-23 MED ORDER — PAROXETINE HCL ER 12.5 MG PO TB24: 12.5000 mg | ORAL_TABLET | Freq: Every day | ORAL | 1 refills | Status: DC

## 2022-05-23 MED ORDER — EVENITY 105 MG/1.17ML ~~LOC~~ SOSY: 210.0000 mg | PREFILLED_SYRINGE | Freq: Once | SUBCUTANEOUS | 0 refills | Status: AC

## 2022-05-23 MED ORDER — TIZANIDINE HCL 2 MG PO TABS: 2.0000 mg | ORAL_TABLET | Freq: Four times a day (QID) | ORAL | 0 refills | Status: DC | PRN

## 2022-05-23 NOTE — Patient Instructions (Addendum)
Trial of paxil CR once daily.  You can take it morning or evening with a  meal and will help you gain weight  Let me know in 2 weeks how it is making you feel   [facility] will be a great choice if your insurance will cover it  Tizanidine for muscle spasm.

## 2022-05-23 NOTE — Assessment & Plan Note (Signed)
Continue wellbutrin,  adding paxil 12.5 mg ,  f not tolerated will try remeron

## 2022-05-23 NOTE — Progress Notes (Signed)
Subjective:  Patient ID: Adrienne Robinson, female    DOB: 1955-05-13  Age: 67 y.o. MRN: [id number]  CC: The primary encounter diagnosis was Osteoporosis without current pathological fracture, unspecified osteoporosis type. Diagnoses of Unintentional weight loss, Hyponatremia, Anxiety and depression, and Chronic pain syndrome were also pertinent to this visit.   HPI Adrienne Robinson presents for  Chief Complaint  Patient presents with   Medical Management of Chronic Issues    Medication refill   1) poor appetite,  increased anxiety,  taking wellbutrin 150 mg.   Wants an appetite stimulant, but also having infrequent panic attacks.  Can't afford to feel tired..  using abmein for insomniaand averaging 7 hours of sleep.  Also  having night sweats again .  Has mentioned it to Eldred  2) iron therapy started by Tasia Catchings.  Still feels tired but anxious . Does not want to increase wellbutrin  3) back pain chronic .  Feels in constant spasm .  Direct pressure from husband eases the pain somehwat.  Taking 10 mg vicodin every 6 hours. Refill history confirmed via [place] Controlled Substance databas, accessed by me today..     Outpatient Medications Prior to Visit  Medication Sig Dispense Refill   amLODipine (NORVASC) 10 MG tablet TAKE 1 TABLET BY MOUTH EVERY DAY 90 tablet 3   buPROPion (WELLBUTRIN XL) 150 MG 24 hr tablet TAKE 1 TABLET BY MOUTH EVERY DAY IN THE MORNING 90 tablet 1   cyanocobalamin (,VITAMIN B-12,) 1000 MCG/ML injection INJECT 1 ML (1,000 MCG TOTAL) INTO THE MUSCLE ONCE A WEEK. (Patient taking differently: Inject 1,000 mcg into the muscle every 30 (thirty) days.) 12 mL 6   esomeprazole (NEXIUM) 20 MG capsule TAKE 1 CAPSULE (20 MG TOTAL) BY MOUTH DAILY. **PLEASE CONTACT OFFICE TO SCHEDULE FOLLOW UP 90 capsule 0   famciclovir (FAMVIR) 500 MG tablet Take 1 tablet (500 mg total) by mouth 3 (three) times daily. For one week 21 tablet 2   fluticasone (FLONASE) 50 MCG/ACT nasal spray USE 2 SPRAYS IN EACH  NOSTRIL DAILY AS NEEDED 16 g 1   furosemide (LASIX) 20 MG tablet TAKE 1 TABLET (20 MG TOTAL) BY MOUTH DAILY WITH BREAKFAST AS NEEDED FOR EDEMA 90 tablet 1   HYDROcodone-acetaminophen (NORCO/VICODIN) 5-325 MG tablet Take 2 tablets by mouth every 6 (six) hours as needed for moderate pain. 240 tablet 0   Iron-Vitamin C 65-125 MG TABS Take 1 tablet by mouth daily. 90 tablet 0   LINZESS 145 MCG CAPS capsule TAKE 1 CAPSULE BY MOUTH EVERY DAY BEFORE BREAKFAST 90 capsule 0   metoprolol succinate (TOPROL-XL) 25 MG 24 hr tablet TAKE 1 TABLET BY MOUTH EVERY DAY 90 tablet 1   Multiple Vitamin (MULTIVITAMIN) tablet Take 1 tablet by mouth daily.     propranolol (INDERAL) 10 MG tablet TAKE 1/2 TABLET BY MOUTH AS NEEDED FOR ANXIETY 15 tablet 1   sodium chloride 1 g tablet Take 1 tablet (1 g total) by mouth 2 (two) times daily with a meal. 60 tablet 2   Syringe/Needle, Disp, (SYRINGE 3CC/25GX1") 25G X 1" 3 ML MISC Use for b12 injections 50 each 0   zolpidem (AMBIEN CR) 12.5 MG CR tablet Take 1 tablet (12.5 mg total) by mouth at bedtime as needed. for sleep 30 tablet 5   No facility-administered medications prior to visit.    Review of Systems;  Patient denies headache, fevers, malaise, unintentional weight loss, skin rash, eye pain, sinus congestion and sinus pain, sore throat,  dysphagia,  hemoptysis , cough, dyspnea, wheezing, chest pain, palpitations, orthopnea, edema, abdominal pain, nausea, melena, diarrhea, constipation, flank pain, dysuria, hematuria, urinary  Frequency, nocturia, numbness, tingling, seizures,  Focal weakness, Loss of consciousness,  Tremor, insomnia, depression, anxiety, and suicidal ideation.      Objective:  BP 118/66   Pulse 73   Temp 98.3 F (36.8 C) (Oral)   Ht 5\' 4"  (1.626 m)   Wt 108 lb 6.4 oz (49.2 kg)   SpO2 94%   BMI 18.61 kg/m   BP Readings from Last 3 Encounters:  05/23/22 118/66  02/15/22 112/70  02/07/22 (!) 140/84    Wt Readings from Last 3 Encounters:   05/23/22 108 lb 6.4 oz (49.2 kg)  02/15/22 110 lb 6.4 oz (50.1 kg)  02/07/22 110 lb 6.4 oz (50.1 kg)    Physical Exam Vitals reviewed.  Constitutional:      General: She is not in acute distress.    Appearance: Normal appearance. She is normal weight. She is not ill-appearing, toxic-appearing or diaphoretic.  HENT:     Head: Normocephalic.  Eyes:     General: No scleral icterus.       Right eye: No discharge.        Left eye: No discharge.     Conjunctiva/sclera: Conjunctivae normal.  Cardiovascular:     Rate and Rhythm: Normal rate and regular rhythm.     Heart sounds: Normal heart sounds.  Pulmonary:     Effort: Pulmonary effort is normal. No respiratory distress.     Breath sounds: Normal breath sounds.  Musculoskeletal:        General: Normal range of motion.  Skin:    General: Skin is warm and dry.  Neurological:     General: No focal deficit present.     Mental Status: She is alert and oriented to person, place, and time. Mental status is at baseline.  Psychiatric:        Mood and Affect: Mood normal.        Behavior: Behavior normal.        Thought Content: Thought content normal.        Judgment: Judgment normal.    Lab Results  Component Value Date   HGBA1C 5.7 08/04/2011    Lab Results  Component Value Date   CREATININE 0.97 11/04/2021   CREATININE 0.97 06/02/2021   CREATININE 1.03 10/29/2020    Lab Results  Component Value Date   WBC 4.2 01/31/2022   HGB 10.8 (L) 01/31/2022   HCT 31.6 (L) 01/31/2022   PLT 306 01/31/2022   GLUCOSE 87 11/04/2021   CHOL 206 (H) 11/04/2021   TRIG 170 (H) 11/04/2021   HDL 57 11/04/2021   LDLDIRECT 81.0 01/22/2020   LDLCALC 120 (H) 11/04/2021   ALT 14 11/04/2021   AST 19 11/04/2021   NA 140 11/04/2021   K 3.6 11/04/2021   CL 103 11/04/2021   CREATININE 0.97 11/04/2021   BUN 15 11/04/2021   CO2 27 11/04/2021   TSH 0.979 01/16/2022   INR 1.0 07/12/2020   HGBA1C 5.7 08/04/2011   MICROALBUR <0.7 06/07/2021     CT BONE MARROW BIOPSY & ASPIRATION  Result Date: 01/31/2022 INDICATION: Anemia. EXAM: CT GUIDED BONE MARROW ASPIRATION AND CORE BIOPSY MEDICATIONS: None. ANESTHESIA/SEDATION: Moderate (conscious) sedation was employed during this procedure. A total of Versed 2 mg and Fentanyl 100 mcg was administered intravenously. Moderate Sedation Time: 22 minutes. The patient's level of consciousness and vital signs were monitored  continuously by radiology nursing throughout the procedure under my direct supervision. FLUOROSCOPY TIME:  CT dose in mGy was not provided. COMPLICATIONS: None immediate. Estimated blood loss: <5 mL PROCEDURE: RADIATION DOSE REDUCTION: This exam was performed according to the departmental dose-optimization program which includes automated exposure control, adjustment of the mA and/or kV according to patient size and/or use of iterative reconstruction technique. Informed written consent was obtained from the the patient and/or patient's representative after a thorough discussion of the procedural risks, benefits and alternatives. All questions were addressed. Maximal Sterile Barrier Technique was utilized including caps, mask, sterile gowns, sterile gloves, sterile drape, hand hygiene and skin antiseptic. A timeout was performed prior to the initiation of the procedure. The patient was positioned prone and non-contrast localization CT was performed of the pelvis to demonstrate the iliac marrow spaces. Maximal barrier sterile technique utilized including caps, mask, sterile gowns, sterile gloves, large sterile drape, hand hygiene, and chlorhexidine prep. Under sterile conditions and local anesthesia, an 11 gauge coaxial bone biopsy needle was advanced into the LEFT iliac marrow space. Needle position was confirmed with CT imaging. Initially, bone marrow aspiration was performed. Next, the 11 gauge outer cannula was utilized to obtain a 2 iliac bone marrow core biopsy. Needle was removed.  Hemostasis was obtained with compression. The patient tolerated the procedure well. Samples were prepared with the cytotechnologist. IMPRESSION: Successful CT-guided bone marrow aspiration and biopsy, as above Michaelle Birks, MD [facility] Radiology Electronically Signed   By: Michaelle Birks M.D.   On: 01/31/2022 10:12    Assessment & Plan:  .Osteoporosis without current pathological fracture, unspecified osteoporosis type Assessment & Plan: Most recent T score -3.3  Discussed treatment options,  Calcium and Vit d requirements. She wants to consider Evenity  PA required    Unintentional weight loss Assessment & Plan: Secondary to depression, anxiety careigver stress.  Adding remeron    Hyponatremia -     Basic metabolic panel  Anxiety and depression Assessment & Plan: Continue wellbutrin,  adding paxil 12.5 mg ,  f not tolerated will try remeron    Chronic pain syndrome Assessment & Plan: She is using pharmcologic and non pharmacologic methods to manage her arthritis. Pain .  she is using a standing desk for work mode and opioids for non operable pain.  She has not had any ER visits for pain management and has not requested any early refills.  Her Refill history was confirmed via [place] Controlled Substance database by me today during her visit and there have been no prescriptions of controlled substances filled from any providers other than me. Refilling for 3 months at a time.  May benefit from trial of cymbalta.   Other orders -     PARoxetine HCl ER; Take 1 tablet (12.5 mg total) by mouth daily.  Dispense: 90 tablet; Refill: 1 -     tiZANidine HCl; Take 1 tablet (2 mg total) by mouth every 6 (six) hours as needed for muscle spasms.  Dispense: 30 tablet; Refill: 0 -     Evenity; Inject 210 mg into the skin once for 1 dose.  Dispense: 2.4 mL; Refill: 0    Follow-up: Return in about 4 weeks (around 06/20/2022).   [name], MD

## 2022-05-23 NOTE — Assessment & Plan Note (Signed)
She is using pharmcologic and non pharmacologic methods to manage her arthritis. Pain .  she is using a standing desk for work mode and opioids for non operable pain.  She has not had any ER visits for pain management and has not requested any early refills.  Her Refill history was confirmed via [place] Controlled Substance database by me today during her visit and there have been no prescriptions of controlled substances filled from any providers other than me. Refilling for 3 months at a time.  May benefit from trial of cymbalta.

## 2022-05-23 NOTE — Assessment & Plan Note (Addendum)
Most recent T score -3.3  Discussed treatment options,  Calcium and Vit d requirements. She wants to consider Evenity  PA required

## 2022-05-23 NOTE — Assessment & Plan Note (Signed)
Secondary to depression, anxiety careigver stress.  Adding remeron

## 2022-05-25 ENCOUNTER — Encounter: Payer: Self-pay | Admitting: Internal Medicine

## 2022-05-29 ENCOUNTER — Telehealth: Payer: Self-pay

## 2022-05-29 ENCOUNTER — Telehealth: Payer: Self-pay | Admitting: Internal Medicine

## 2022-05-29 ENCOUNTER — Other Ambulatory Visit: Payer: Self-pay | Admitting: Internal Medicine

## 2022-05-29 MED ORDER — CITALOPRAM HYDROBROMIDE 10 MG PO TABS
10.0000 mg | ORAL_TABLET | Freq: Every day | ORAL | 3 refills | Status: DC
Start: 2022-05-29 — End: 2022-06-21

## 2022-05-29 MED ORDER — MIRTAZAPINE 7.5 MG PO TABS: 7.5000 mg | ORAL_TABLET | Freq: Every day | ORAL | 2 refills | Status: DC

## 2022-05-29 NOTE — Telephone Encounter (Signed)
Spoke with pt and she stated that the Paxil is making her feel "weird". She stated that she is going to stop the medication but would like see if there is something else that could be called in that would help with the anxiety and increasing her appetite.

## 2022-05-29 NOTE — Telephone Encounter (Signed)
Spoke with pt and she stated that she does not use a speciality pharmacy nor a medication that comes in a prefilled syringe.

## 2022-05-29 NOTE — Telephone Encounter (Signed)
noted 

## 2022-05-29 NOTE — Telephone Encounter (Signed)
Melody from [facility] specialty pharmacy called stating they need clarity on directions on prefilled syringes 105mg  on frequency

## 2022-05-29 NOTE — Telephone Encounter (Signed)
LMTCB

## 2022-05-29 NOTE — Telephone Encounter (Signed)
Pt called back. Note below from provider was read to pt. Pt aware. As per pt, she already has an appt at the end of month to see Tullo.

## 2022-06-01 ENCOUNTER — Other Ambulatory Visit: Payer: Self-pay | Admitting: Internal Medicine

## 2022-06-01 ENCOUNTER — Telehealth: Payer: Self-pay

## 2022-06-01 NOTE — Telephone Encounter (Signed)
Received a fax from [facility] speciality pharmacy stating that they are needing clarification on directions of use for the Evenity.

## 2022-06-02 NOTE — Telephone Encounter (Signed)
I tried to check benefits for Evenity. But was unsuccessful. Will forward to PA team to see if they can do a test run & see if PA is required. Not sure if it would be better for her to pickup RX from pharmacy & bring to office or if we can order it for her just yet.  Sending to PA team for assistance

## 2022-06-05 ENCOUNTER — Encounter: Payer: Self-pay | Admitting: Internal Medicine

## 2022-06-05 ENCOUNTER — Other Ambulatory Visit: Payer: Self-pay | Admitting: Internal Medicine

## 2022-06-05 MED ORDER — HYDROCODONE-ACETAMINOPHEN 5-325 MG PO TABS: 2.0000 | ORAL_TABLET | Freq: Four times a day (QID) | ORAL | 0 refills | Status: DC | PRN

## 2022-06-05 NOTE — Progress Notes (Unsigned)
[facility] [address] [place] [address] Phone: [phone] Subjective:   Adrienne Robinson, am serving as a scribe for Dr. Antoine Robinson.  I'm seeing this patient by the request  of:  Adrienne Shams, MD  CC: Left thumb pain, back pain  [id number]  01/18/2022 New problem  Discussed HEP  Discussed may need xray  RTC in 6 weeks      Chronic problem with exacerbation.  Discussed icing regimen and home exercises.  Discussed which activities to do and which ones to avoid have responded extremely well and feels like she responds better to this in the epidurals.  Follow-up with me again in 6 to 8 weeks     Repeat injection given again today  Discussed HEP  Discussed the possible need for surgical intervention at some point.  Patient wants to hold at this time.  Will follow-up with me again in 8 to 10 weeks     Updated 06/06/2022 Adrienne Robinson is a 67 y.o. female coming in with complaint of R knee L shoulder and L thumb pain. Pain in L thumb has increased since last visit. Would like injection today.   Also wants injection in R side of thoracic spine for muscle spasm.       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   Prepatellar effusion of right knee 06/09/2019   Aspiration done June 13, 2019   Scoliosis    Spinal stenosis of lumbar region    Squamous cell carcinoma of skin 06/24/2018   R lateral calf    Tubular adenoma of colon    Past Surgical History:  Procedure Laterality Date   ABDOMINAL HYSTERECTOMY     APPENDECTOMY     CARDIAC CATHETERIZATION     normal coronaries, no wall motion abnormalities 11/02/09 [facility])   CATARACT EXTRACTION W/PHACO Left 11/24/2020   Procedure: CATARACT EXTRACTION PHACO AND INTRAOCULAR LENS PLACEMENT (IOC) LEFT 6.09 00:46.7;  Surgeon: Adrienne Mola, MD;  Location: [facility];  Service: Ophthalmology;  Laterality: Left;  patient wants early   CATARACT EXTRACTION W/PHACO Right 12/08/2020   Procedure: CATARACT EXTRACTION PHACO AND INTRAOCULAR LENS PLACEMENT (IOC) RIGHT 3.92 00:49.0;  Surgeon: Adrienne Mola, MD;  Location: [facility];  Service: Ophthalmology;  Laterality: Right;  patient request early   NASAL SINUS SURGERY     OOPHORECTOMY     RADIOLOGY WITH ANESTHESIA  02/26/2012   Procedure: RADIOLOGY WITH ANESTHESIA;  Surgeon: Adrienne Grout, MD;  Location: [facility] OR;  Service: Radiology;  Laterality: N/A;   REVERSE SHOULDER ARTHROPLASTY Right 06/27/2013   Procedure: REVERSE RIGHT TOTAL SHOULDER ARTHROPLASTY;  Surgeon: Adrienne Rossetti, MD;  Location: [facility] OR;  Service: Orthopedics;  Laterality: Right;   SHOULDER ARTHROSCOPY WITH ROTATOR CUFF REPAIR AND SUBACROMIAL DECOMPRESSION  2007   left   TONSILLECTOMY  adnoids   Social History   Socioeconomic History   Marital status: Married    Spouse name: Not on file   Number of children: 0   Years of education: 18   Highest education level: Not on file  Occupational History   Occupation: Nurse executive   Tobacco Use   Smoking status: Never   Smokeless tobacco: Never  Vaping Use   Vaping Use: Never used  Substance and Sexual Activity   Alcohol use: Yes    Alcohol/week: 2.0 standard drinks of alcohol    Types: 2 Glasses of wine per week    Comment: 2-3 week with dinner   Drug use: No   Sexual activity: Yes  Other Topics Concern   Not on file  Social History Narrative    Lives at home with her husband.   Right-handed.   2-3 diet cokes per day.   Social Determinants of Health   Financial Resource Strain: Not on file  Food Insecurity: Not on file  Transportation Needs: Not on file  Physical Activity: Not on file  Stress: Not on file  Social Connections: Not on file   Allergies  Allergen Reactions   Buspirone Other (See Comments)    Dizziness,  dry mouth    Celebrex [Celecoxib] Swelling   Adhesive [Tape] Rash and Other (See Comments)    Redness, blisters, skin peeling off.   Morphine And Related Rash   Family History  Problem Relation Age of Onset   Thyroid disease Mother    Colon cancer Father 55   Diabetes Father    Heart disease Father    Rectal cancer Father    Breast cancer Paternal Grandmother    Colon cancer Paternal Grandfather    Colon cancer Other        pggm   Stomach cancer Neg Hx    Esophageal cancer Neg Hx      Current Outpatient Medications (Cardiovascular):    amLODipine (NORVASC) 10 MG tablet, TAKE 1 TABLET BY MOUTH EVERY DAY   furosemide (LASIX) 20 MG tablet, TAKE 1 TABLET (20 MG TOTAL) BY MOUTH DAILY WITH BREAKFAST AS NEEDED FOR EDEMA   metoprolol succinate (TOPROL-XL) 25 MG 24 hr tablet, TAKE 1 TABLET BY MOUTH EVERY DAY   propranolol (INDERAL) 10 MG tablet, TAKE 1/2 TABLET BY MOUTH AS NEEDED FOR ANXIETY  Current Outpatient Medications (Respiratory):    fluticasone (FLONASE) 50 MCG/ACT nasal spray, USE 2 SPRAYS IN EACH NOSTRIL DAILY AS NEEDED  Current Outpatient Medications (Analgesics):    HYDROcodone-acetaminophen (NORCO/VICODIN) 5-325 MG tablet, Take 2 tablets by mouth every 6 (six) hours as needed for moderate pain.   HYDROcodone-acetaminophen (NORCO/VICODIN) 5-325 MG tablet, Take 2 tablets by mouth every 6 (six) hours as needed for moderate pain.   HYDROcodone-acetaminophen (NORCO/VICODIN) 5-325 MG tablet, Take 2 tablets by mouth every 6 (six) hours as needed for moderate pain.  Current Outpatient Medications  (Hematological):    cyanocobalamin (,VITAMIN B-12,) 1000 MCG/ML injection, INJECT 1 ML (1,000 MCG TOTAL) INTO THE MUSCLE ONCE A WEEK. (Patient taking differently: Inject 1,000 mcg into the muscle every 30 (thirty) days.)   Iron-Vitamin C 65-125 MG TABS, Take 1 tablet by mouth daily.  Current Outpatient Medications (Other):    buPROPion (WELLBUTRIN XL) 150 MG 24 hr tablet, TAKE 1 TABLET BY MOUTH EVERY DAY IN THE MORNING   citalopram (CELEXA) 10 MG tablet, Take 1 tablet (10 mg total) by mouth daily.   esomeprazole (NEXIUM) 20 MG capsule, TAKE 1 CAPSULE (20 MG  TOTAL) BY MOUTH DAILY. **PLEASE CONTACT OFFICE TO SCHEDULE FOLLOW UP   famciclovir (FAMVIR) 500 MG tablet, Take 1 tablet (500 mg total) by mouth 3 (three) times daily. For one week   LINZESS 145 MCG CAPS capsule, TAKE 1 CAPSULE BY MOUTH EVERY DAY BEFORE BREAKFAST   mirtazapine (REMERON) 7.5 MG tablet, Take 1 tablet (7.5 mg total) by mouth at bedtime.   Multiple Vitamin (MULTIVITAMIN) tablet, Take 1 tablet by mouth daily.   sodium chloride 1 g tablet, Take 1 tablet (1 g total) by mouth 2 (two) times daily with a meal.   Syringe/Needle, Disp, (SYRINGE 3CC/25GX1") 25G X 1" 3 ML MISC, Use for b12 injections   tiZANidine (ZANAFLEX) 2 MG tablet, Take 1 tablet (2 mg total) by mouth every 6 (six) hours as needed for muscle spasms.   zolpidem (AMBIEN CR) 12.5 MG CR tablet, Take 1 tablet (12.5 mg total) by mouth at bedtime as needed. for sleep   Reviewed prior external information including notes and imaging from  primary care provider As well as notes that were available from care everywhere and other healthcare systems.  Past medical history, social, surgical and family history all reviewed in electronic medical record.  No pertanent information unless stated regarding to the chief complaint.   Review of Systems:  No headache, visual changes, nausea, vomiting, diarrhea, constipation, dizziness, abdominal pain, skin rash, fevers, chills, night  sweats, weight loss, swollen lymph nodes, body aches, joint swelling, chest pain, shortness of breath, mood changes. POSITIVE muscle aches  Objective  Blood pressure 102/72, pulse 60, height  (1.626 m), weight 108 lb (49 kg), SpO2 98 %.   General: No apparent distress alert and oriented x3 mood and affect normal, dressed appropriately.  HEENT: Pupils equal, extraocular movements intact  Respiratory: Patient's speak in full sentences and does not appear short of breath  Cardiovascular: No lower extremity edema, non tender, no erythema  Patient does have arthritic changes in the different joints noted.  Patient does have crepitus noted of the [facility] joint.  Limited sidebending of the neck bilaterally.  Patient does have multiple trigger points noted in the thoracic spine right greater than left today.  After verbal consent patient was prepped with alcohol swab and 4 distinct trigger points in the rhomboid, latissimus dorsi, serratus muscles in the right side with a total of 3 cc of 0.5% Marcaine and 1 cc of Kenalog 40 mg/mL use.  No blood loss.  Postinjection instructions given and explained.  Procedure: Real-time Ultrasound Guided Injection of left CMC joint Device: GE Logiq Q7 Ultrasound guided injection is preferred based studies that show increased duration, increased effect, greater accuracy, decreased procedural pain, increased response rate, and decreased cost with ultrasound guided versus blind injection.  Verbal informed consent obtained.  Time-out conducted.  Noted no overlying erythema, induration, or other signs of local infection.  Skin prepped in a sterile fashion.  Local anesthesia: Topical Ethyl chloride.  With sterile technique and under real time ultrasound guidance: With a 25-gauge half inch needle injected with 0.5 cc of 0.5% Marcaine and 0.5 cc of Kenalog 40 mg/mL Completed without difficulty  Pain immediately resolved suggesting accurate placement of the medication.   Advised to call if fevers/chills, erythema, induration, drainage, or persistent bleeding.  Impression: Technically successful ultrasound guided injection.    Impression and Recommendations:     The above documentation has been reviewed and is accurate and complete Judi Saa, DO

## 2022-06-06 ENCOUNTER — Ambulatory Visit: Payer: Managed Care, Other (non HMO) | Admitting: Family Medicine

## 2022-06-06 ENCOUNTER — Other Ambulatory Visit: Payer: Self-pay

## 2022-06-06 ENCOUNTER — Other Ambulatory Visit: Payer: Self-pay | Admitting: Internal Medicine

## 2022-06-06 ENCOUNTER — Encounter: Payer: Self-pay | Admitting: Family Medicine

## 2022-06-06 VITALS — BP 102/72 | HR 60 | Ht 64.0 in | Wt 108.0 lb

## 2022-06-06 DIAGNOSIS — G8929 Other chronic pain: Secondary | ICD-10-CM

## 2022-06-06 DIAGNOSIS — M79645 Pain in left finger(s): Secondary | ICD-10-CM

## 2022-06-06 DIAGNOSIS — M1812 Unilateral primary osteoarthritis of first carpometacarpal joint, left hand: Secondary | ICD-10-CM | POA: Diagnosis not present

## 2022-06-06 DIAGNOSIS — M25512 Pain in left shoulder: Secondary | ICD-10-CM | POA: Diagnosis not present

## 2022-06-06 DIAGNOSIS — M546 Pain in thoracic spine: Secondary | ICD-10-CM | POA: Insufficient documentation

## 2022-06-06 MED ORDER — HYDROCODONE-ACETAMINOPHEN 5-325 MG PO TABS: 2.0000 | ORAL_TABLET | Freq: Four times a day (QID) | ORAL | 0 refills | Status: DC | PRN

## 2022-06-06 NOTE — Patient Instructions (Signed)
See me in 10 weeks Injected thumb and trigger points

## 2022-06-06 NOTE — Assessment & Plan Note (Signed)
Multiple areas noted in the thoracic spine.  This includes the latissimus dorsi, rhomboids, as well as spinalis musculature.  Patient responded well to the injections.  Had some improvement almost immediately.  Discussed with patient the underlying spinal stenosis is likely not helping at the moment.  Increase activity slowly.  Follow-up with me again in 10 weeks.

## 2022-06-06 NOTE — Assessment & Plan Note (Signed)
Patient given injection and tolerated the procedure extremely well.  Discussed with patient about icing regimen and home exercises.  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  We discussed with patient about icing regimen and home exercises, discussed potentially bracing at night.  Follow-up with me again 10 weeks for further evaluation and treatment.

## 2022-06-06 NOTE — Assessment & Plan Note (Signed)
Will monitor and see if we need to repeat again

## 2022-06-09 ENCOUNTER — Encounter: Payer: Self-pay | Admitting: Oncology

## 2022-06-09 ENCOUNTER — Inpatient Hospital Stay: Payer: Managed Care, Other (non HMO) | Attending: Oncology

## 2022-06-09 ENCOUNTER — Inpatient Hospital Stay (HOSPITAL_BASED_OUTPATIENT_CLINIC_OR_DEPARTMENT_OTHER): Payer: Managed Care, Other (non HMO) | Admitting: Oncology

## 2022-06-09 VITALS — BP 129/76 | HR 80 | Temp 96.6°F | Resp 18 | Wt 116.8 lb

## 2022-06-09 DIAGNOSIS — D649 Anemia, unspecified: Secondary | ICD-10-CM

## 2022-06-09 DIAGNOSIS — R634 Abnormal weight loss: Secondary | ICD-10-CM | POA: Diagnosis not present

## 2022-06-09 LAB — COMPREHENSIVE METABOLIC PANEL
ALT: 25 U/L (ref 0–44)
AST: 34 U/L (ref 15–41)
Albumin: 4.2 g/dL (ref 3.5–5.0)
Alkaline Phosphatase: 74 U/L (ref 38–126)
Anion gap: 8 (ref 5–15)
BUN: 22 mg/dL (ref 8–23)
CO2: 24 mmol/L (ref 22–32)
Calcium: 9 mg/dL (ref 8.9–10.3)
Chloride: 106 mmol/L (ref 98–111)
Creatinine, Ser: 0.74 mg/dL (ref 0.44–1.00)
GFR, Estimated: >60 mL/min (ref >60)
Glucose, Bld: 99 mg/dL (ref 70–99)
Potassium: 3.6 mmol/L (ref 3.5–5.1)
Sodium: 138 mmol/L (ref 135–145)
Total Bilirubin: 0.5 mg/dL (ref 0.3–1.2)
Total Protein: 7.3 g/dL (ref 6.5–8.1)

## 2022-06-09 LAB — CBC WITH DIFFERENTIAL/PLATELET
Abs Immature Granulocytes: 0.03 10*3/uL (ref 0.00–0.07)
Basophils Absolute: 0 10*3/uL (ref 0.0–0.1)
Basophils Relative: 0 %
Eosinophils Absolute: 0 10*3/uL (ref 0.0–0.5)
Eosinophils Relative: 0 %
HCT: 30.2 % — ABNORMAL LOW (ref 36.0–46.0)
Hemoglobin: 10.3 g/dL — ABNORMAL LOW (ref 12.0–15.0)
Immature Granulocytes: 0 %
Lymphocytes Relative: 17 %
Lymphs Abs: 1.4 10*3/uL (ref 0.7–4.0)
MCH: 34.1 pg — ABNORMAL HIGH (ref 26.0–34.0)
MCHC: 34.1 g/dL (ref 30.0–36.0)
MCV: 100 fL (ref 80.0–100.0)
Monocytes Absolute: 0.9 10*3/uL (ref 0.1–1.0)
Monocytes Relative: 12 %
Neutro Abs: 5.5 10*3/uL (ref 1.7–7.7)
Neutrophils Relative %: 71 %
Platelets: 303 10*3/uL (ref 150–400)
RBC: 3.02 MIL/uL — ABNORMAL LOW (ref 3.87–5.11)
RDW: 13.2 % (ref 11.5–15.5)
WBC: 7.8 10*3/uL (ref 4.0–10.5)
nRBC: 0 % (ref 0.0–0.2)

## 2022-06-09 LAB — RETIC PANEL
Immature Retic Fract: 20.6 % — ABNORMAL HIGH (ref 2.3–15.9)
RBC.: 3.06 MIL/uL — ABNORMAL LOW (ref 3.87–5.11)
Retic Count, Absolute: 68.5 10*3/uL (ref 19.0–186.0)
Retic Ct Pct: 2.2 % (ref 0.4–3.1)
Reticulocyte Hemoglobin: 35.1 pg (ref >27.9)

## 2022-06-09 LAB — FOLATE: Folate: 18.8 ng/mL (ref >5.9)

## 2022-06-09 LAB — IRON AND TIBC
Iron: 140 ug/dL (ref 28–170)
Saturation Ratios: 38 % — ABNORMAL HIGH (ref 10.4–31.8)
TIBC: 372 ug/dL (ref 250–450)
UIBC: 232 ug/dL

## 2022-06-09 LAB — VITAMIN B12: Vitamin B-12: 785 pg/mL (ref 180–914)

## 2022-06-09 LAB — FERRITIN: Ferritin: 74 ng/mL (ref 11–307)

## 2022-06-09 MED ORDER — IRON-VITAMIN C 65-125 MG PO TABS: 1.0000 | ORAL_TABLET | Freq: Every day | ORAL | 1 refills | Status: DC

## 2022-06-09 NOTE — Assessment & Plan Note (Signed)
Weight has increased since last visit.  Continue to monitor.

## 2022-06-09 NOTE — Assessment & Plan Note (Addendum)
Chronic anemia, with fluctuating levels. Labs reviewed and discussed with patient.  Normal reticulocyte panel, no immunophenotypic abnormality peripheral blood flow cytometry, normal LDH, normal haptoglobin, normal iron TIBC ferritin, negative ANA, normal TSH, negative CMV, negative EBV, negative parvovirus, Her previous workup was negative for iron deficiency, folate deficiency, B12 deficiency.   Bone marrow biopsy results reviewed and discussed with patient.  Normal cellularity with no increased blast.  No significant dysplastic.  Cytogenetics normal, MDS FISH panel negative. . She has scant iron staining on bone marrow staining.  Empiric iron supplementation did not improve hemoglobin level  Check PNH, zinc copper, and Intelligen Myeloid panel ? Anemia due to chronic disease.

## 2022-06-09 NOTE — Progress Notes (Signed)
Hematology/Oncology Progress note [phone] [phone]         Patient Care Team: Sherlene Shams, MD as PCP - General (Internal Medicine)   REFERRING PROVIDER: Sherlene Shams, MD  CHIEF COMPLAINTS/REASON FOR VISIT:  Anemia  ASSESSMENT & PLAN:   Normocytic anemia Chronic anemia, with fluctuating levels. Labs reviewed and discussed with patient.  Normal reticulocyte panel, no immunophenotypic abnormality peripheral blood flow cytometry, normal LDH, normal haptoglobin, normal iron TIBC ferritin, negative ANA, normal TSH, negative CMV, negative EBV, negative parvovirus, Her previous workup was negative for iron deficiency, folate deficiency, B12 deficiency.   Bone marrow biopsy results reviewed and discussed with patient.  Normal cellularity with no increased blast.  No significant dysplastic.  Cytogenetics normal, MDS FISH panel negative. . She has scant iron staining on bone marrow staining.  Empiric iron supplementation did not improve hemoglobin level  Check PNH, zinc copper, and Intelligen Myeloid panel ? Anemia due to chronic disease.    Unintentional weight loss Weight has increased since last visit.  Continue to monitor.  Orders Placed This Encounter  Procedures   IntelliGEN Myeloid    Standing Status:   Future    Number of Occurrences:   1    Standing Expiration Date:   06/09/2023   Vitamin B12    Standing Status:   Future    Number of Occurrences:   1    Standing Expiration Date:   06/09/2023   Folate    Standing Status:   Future    Number of Occurrences:   1    Standing Expiration Date:   06/09/2023   Zinc    Standing Status:   Future    Number of Occurrences:   1    Standing Expiration Date:   06/09/2023   Copper, serum    Standing Status:   Future    Number of Occurrences:   1    Standing Expiration Date:   06/09/2023   PNH Profile (-High Sensitivity)    Standing Status:   Future    Number of Occurrences:   1    Standing  Expiration Date:   06/09/2023   CBC with Differential (Cancer Center Only)    Standing Status:   Future    Standing Expiration Date:   06/09/2023   CMP (Cancer Center only)    Standing Status:   Future    Standing Expiration Date:   06/09/2023   Iron and TIBC    Standing Status:   Future    Standing Expiration Date:   06/09/2023   Ferritin    Standing Status:   Future    Standing Expiration Date:   06/09/2023   Retic Panel    Standing Status:   Future    Standing Expiration Date:   06/09/2023   Follow-up  4 months. All questions were answered. The patient knows to call the clinic with any problems, questions or concerns.  Rickard Patience, MD, PhD [facility] Hematology Oncology 06/09/2022     HISTORY OF PRESENTING ILLNESS:  Adrienne Robinson is a  67 y.o.  female with PMH listed below who was referred to me for anemia Reviewed patient's recent labs that was done.  Patient has longstanding history of anemia, since at least 2012.  Her counts ranges from 10s -11s. Patient reports feeling tired and fatigued.  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 She denies recent chest pain on  exertion, shortness of breath on minimal exertion, pre-syncopal episodes, or palpitations She had not noticed any recent bleeding such as hematuria or hematochezia.   Patient's previous workup includes normal iron panel, negative M protein on protein electrophoresis, random urine showed no monoclonal protein normal erythropoietin level,Normal folate and B12 level, Patient denies any occupational toxin exposure.  Patient is on vitamin B12 injection weekly, managed by primary care provider.  She denies any routine alcohol consumption history.   INTERVAL HISTORY Adrienne Robinson is a 67 y.o. female who has above history reviewed by me today presents for follow up visit for anemia.  She has gained weight since last visit. No new complaints. No nausea vomiting  diarrhea. Night sweat 2-3 time per week.   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   Prepatellar effusion of right knee 06/09/2019   Aspiration done June 13, 2019   Scoliosis    Spinal stenosis of lumbar region    Squamous cell carcinoma of skin 06/24/2018   R lateral calf   Tubular adenoma of colon     SURGICAL HISTORY: Past Surgical History:  Procedure Laterality Date   ABDOMINAL HYSTERECTOMY     APPENDECTOMY     CARDIAC CATHETERIZATION     normal coronaries, no wall motion abnormalities 11/02/09 [facility])   CATARACT EXTRACTION W/PHACO Left 11/24/2020   Procedure: CATARACT EXTRACTION PHACO AND INTRAOCULAR LENS PLACEMENT (IOC) LEFT 6.09 00:46.7;  Surgeon: Lockie Mola, MD;  Location: [facility];  Service: Ophthalmology;  Laterality: Left;  patient wants early   CATARACT EXTRACTION W/PHACO Right 12/08/2020   Procedure: CATARACT EXTRACTION PHACO AND INTRAOCULAR LENS PLACEMENT (IOC) RIGHT 3.92 00:49.0;  Surgeon: Lockie Mola, MD;  Location: [facility];  Service: Ophthalmology;  Laterality: Right;  patient request early   NASAL SINUS SURGERY     OOPHORECTOMY     RADIOLOGY WITH ANESTHESIA  02/26/2012   Procedure: RADIOLOGY WITH ANESTHESIA;  Surgeon: Oneal Grout, MD;  Location: [facility] OR;  Service: Radiology;  Laterality: N/A;   REVERSE SHOULDER ARTHROPLASTY Right 06/27/2013   Procedure: REVERSE RIGHT TOTAL  SHOULDER ARTHROPLASTY;  Surgeon: Verlee Rossetti, MD;  Location: [facility] OR;  Service: Orthopedics;  Laterality: Right;   SHOULDER ARTHROSCOPY WITH ROTATOR CUFF REPAIR AND SUBACROMIAL DECOMPRESSION  2007   left   TONSILLECTOMY  adnoids    SOCIAL HISTORY: Social History   Socioeconomic History   Marital status: Married    Spouse name: Not on file   Number of children: 0   Years of education: 18   Highest education level: Not on file  Occupational History   Occupation: Nurse executive   Tobacco Use   Smoking status: Never   Smokeless tobacco: Never  Vaping Use   Vaping Use: Never used  Substance and Sexual Activity   Alcohol use: Yes    Alcohol/week:  2.0 standard drinks of alcohol    Types: 2 Glasses of wine per week    Comment: 2-3 week with dinner   Drug use: No   Sexual activity: Yes  Other Topics Concern   Not on file  Social History Narrative   Lives at home with her husband.   Right-handed.   2-3 diet cokes per day.   Social Determinants of Health   Financial Resource Strain: Not on file  Food Insecurity: Not on file  Transportation Needs: Not on file  Physical Activity: Not on file  Stress: Not on file  Social Connections: Not on file  Intimate Partner Violence: Not on file    FAMILY HISTORY: Family History  Problem Relation Age of Onset   Thyroid disease Mother    Colon cancer Father 62   Diabetes Father    Heart disease Father    Rectal cancer Father    Breast cancer Paternal Grandmother    Colon cancer Paternal Grandfather    Colon cancer Other        pggm   Stomach cancer Neg Hx    Esophageal cancer Neg Hx     ALLERGIES:  is allergic to buspirone, celebrex [celecoxib], adhesive [tape], and morphine and related.  MEDICATIONS:  Current Outpatient Medications  Medication Sig Dispense Refill   amLODipine (NORVASC) 10 MG tablet TAKE 1 TABLET BY MOUTH EVERY DAY 90 tablet 3   buPROPion (WELLBUTRIN XL) 150 MG 24 hr tablet TAKE 1 TABLET BY MOUTH EVERY  DAY IN THE MORNING 90 tablet 1   cyanocobalamin (,VITAMIN B-12,) 1000 MCG/ML injection INJECT 1 ML (1,000 MCG TOTAL) INTO THE MUSCLE ONCE A WEEK. (Patient taking differently: Inject 1,000 mcg into the muscle every 30 (thirty) days.) 12 mL 6   esomeprazole (NEXIUM) 20 MG capsule TAKE 1 CAPSULE (20 MG TOTAL) BY MOUTH DAILY. **PLEASE CONTACT OFFICE TO SCHEDULE FOLLOW UP 90 capsule 0   famciclovir (FAMVIR) 500 MG tablet Take 1 tablet (500 mg total) by mouth 3 (three) times daily. For one week 21 tablet 2   fluticasone (FLONASE) 50 MCG/ACT nasal spray USE 2 SPRAYS IN EACH NOSTRIL DAILY AS NEEDED 16 g 1   furosemide (LASIX) 20 MG tablet TAKE 1 TABLET (20 MG TOTAL) BY MOUTH DAILY WITH BREAKFAST AS NEEDED FOR EDEMA 90 tablet 1   HYDROcodone-acetaminophen (NORCO/VICODIN) 5-325 MG tablet Take 2 tablets by mouth every 6 (six) hours as needed for moderate pain. 240 tablet 0   LINZESS 145 MCG CAPS capsule TAKE 1 CAPSULE BY MOUTH EVERY DAY BEFORE BREAKFAST 90 capsule 0   metoprolol succinate (TOPROL-XL) 25 MG 24 hr tablet TAKE 1 TABLET BY MOUTH EVERY DAY 90 tablet 1   Multiple Vitamin (MULTIVITAMIN) tablet Take 1 tablet by mouth daily.     propranolol (INDERAL) 10 MG tablet TAKE 1/2 TABLET BY MOUTH AS NEEDED FOR ANXIETY 15 tablet 1   sodium chloride 1 g tablet Take 1 tablet (1 g total) by mouth 2 (two) times daily with a meal. 60 tablet 2   Syringe/Needle, Disp, (SYRINGE 3CC/25GX1") 25G X 1" 3 ML MISC Use for b12 injections 50 each 0   tiZANidine (ZANAFLEX) 2 MG tablet Take 1 tablet (2 mg total) by mouth every 6 (six) hours as needed for muscle spasms. 30 tablet 0   zolpidem (AMBIEN CR) 12.5 MG CR tablet Take 1 tablet (12.5 mg total) by mouth at bedtime as needed. for sleep 30 tablet 5   citalopram (CELEXA) 10 MG tablet Take 1  tablet (10 mg total) by mouth daily. (Patient not taking: Reported on 06/09/2022) 30 tablet 3   Iron-Vitamin C 65-125 MG TABS Take 1 tablet by mouth daily. 90 tablet 1   mirtazapine  (REMERON) 7.5 MG tablet Take 1 tablet (7.5 mg total) by mouth at bedtime. (Patient not taking: Reported on 06/09/2022) 30 tablet 2   No current facility-administered medications for this visit.    Review of Systems  Constitutional:  Positive for fatigue. Negative for appetite change, chills and fever.  HENT:   Negative for hearing loss and voice change.   Eyes:  Negative for eye problems.  Respiratory:  Negative for chest tightness and cough.   Cardiovascular:  Negative for chest pain.  Gastrointestinal:  Negative for abdominal distention, abdominal pain and blood in stool.  Endocrine: Negative for hot flashes.  Genitourinary:  Negative for difficulty urinating and frequency.   Musculoskeletal:  Negative for arthralgias.  Skin:  Negative for itching and rash.  Neurological:  Negative for extremity weakness.  Hematological:  Negative for adenopathy.  Psychiatric/Behavioral:  Negative for confusion.     PHYSICAL EXAMINATION: ECOG PERFORMANCE STATUS: 1 - Symptomatic but completely ambulatory Vitals:   06/09/22 1037  BP: 129/76  Pulse: 80  Resp: 18  Temp: (!) 96.6 F (35.9 C)  SpO2: 99%   Filed Weights   06/09/22 1037  Weight: 116 lb 12.8 oz (53 kg)    Physical Exam Constitutional:      General: She is not in acute distress.    Comments: Thin built   HENT:     Head: Normocephalic.  Eyes:     General: No scleral icterus. Cardiovascular:     Rate and Rhythm: Normal rate.  Pulmonary:     Effort: Pulmonary effort is normal. No respiratory distress.  Abdominal:     General: There is no distension.  Musculoskeletal:        General: Normal range of motion.     Cervical back: Normal range of motion and neck supple.  Skin:    Coloration: Skin is not pale.     Findings: No rash.  Neurological:     Mental Status: She is alert and oriented to person, place, and time. Mental status is at baseline.     Cranial Nerves: No cranial nerve deficit.  Psychiatric:        Mood and  Affect: Mood normal.      LABORATORY DATA:  I have reviewed the data as listed    Latest Ref Rng & Units 06/09/2022   10:12 AM 01/31/2022    8:22 AM 01/16/2022   12:08 PM  CBC  WBC 4.0 - 10.5 K/uL 7.8  4.2  4.4   Hemoglobin 12.0 - 15.0 g/dL 16.1  09.6  04.5   Hematocrit 36.0 - 46.0 % 30.2  31.6  33.5   Platelets 150 - 400 K/uL 303  306  387       Latest Ref Rng & Units 06/09/2022   10:12 AM 05/23/2022   12:24 PM 11/10/2021   10:43 AM  CMP  Glucose 70 - 99 mg/dL 99  409    BUN 8 - 23 mg/dL 22  12    Creatinine 8.11 - 1.00 mg/dL 9.14  7.82    Sodium 956 - 145 mmol/L 138  135    Potassium 3.5 - 5.1 mmol/L 3.6  3.4    Chloride 98 - 111 mmol/L 106  100    CO2 22 - 32 mmol/L 24  26    Calcium 8.9 - 10.3 mg/dL 9.0  9.5    Total Protein 6.5 - 8.1 g/dL 7.3   6.7   Total Bilirubin 0.3 - 1.2 mg/dL 0.5     Alkaline Phos 38 - 126 U/L 74     AST 15 - 41 U/L 34     ALT 0 - 44 U/L 25         Component Value Date/Time   IRON 140 06/09/2022 1012   IRON 152 (H) 04/22/2020 1118   IRON 76 02/07/2012 1518   TIBC 372 06/09/2022 1012   TIBC 339 04/22/2020 1118   TIBC 382 02/07/2012 1518   FERRITIN 74 06/09/2022 1012   FERRITIN 49 02/07/2012 1518   IRONPCTSAT 38 (H) 06/09/2022 1012   IRONPCTSAT 32 08/06/2020 1403     RADIOGRAPHIC STUDIES: I have personally reviewed the radiological images as listed and agreed with the findings in the report. No results found.

## 2022-06-12 LAB — COPPER, SERUM: Copper: 110 ug/dL (ref 80–158)

## 2022-06-12 LAB — ZINC: Zinc: 92 ug/dL (ref 44–115)

## 2022-06-14 ENCOUNTER — Telehealth: Payer: Self-pay

## 2022-06-14 ENCOUNTER — Other Ambulatory Visit (HOSPITAL_COMMUNITY): Payer: Self-pay

## 2022-06-14 DIAGNOSIS — M810 Age-related osteoporosis without current pathological fracture: Secondary | ICD-10-CM

## 2022-06-14 NOTE — Telephone Encounter (Addendum)
Pt ready for scheduling for Evenity on or after : 06/14/22  Out-of-pocket cost due at time of visit: $0  Primary: [employer] - Commercial Prolia co-insurance: $0 Admin fee co-insurance: $0  Secondary: n/a Prolia co-insurance:  Admin fee co-insurance:   Medical Benefit Details: Date Benefits were checked: 06/14/22 Deductible: $700(59met)/ Coinsurance: 0/ Admin Fee: 0  Prior Auth: not required PA# Expiration Date:    Pharmacy benefit: Copay $covered under medical benefit If patient wants fill through the pharmacy benefit please send prescription to: , and include estimated need by date in rx notes. Pharmacy will ship medication directly to the office.  Patient may be eligible for Evenity Copay Card. Copay Card can make patient's cost as little as $25. Link to apply: https://www.amgensupportplus.com/copay  ** This summary of benefits is an estimation of the patient's out-of-pocket cost. Exact cost may very based on individual plan coverage.    Placed a call to [employer] at [phone] to obtain benefits. Call reference [phone]

## 2022-06-16 NOTE — Telephone Encounter (Signed)
Pt aware, and would like to get the Evenity. Is this something she can get in office? If so, I don't understand the cost.  Pt would like to know what she would be responsible for each month out of pocket.

## 2022-06-19 LAB — PNH PROFILE (-HIGH SENSITIVITY)

## 2022-06-20 ENCOUNTER — Telehealth: Payer: Managed Care, Other (non HMO) | Admitting: Internal Medicine

## 2022-06-20 NOTE — Telephone Encounter (Signed)
The Evenity injection should not cost the patient anything at her office visit. Evenity is covered under her medical benefit and will have to do the buy and bill.

## 2022-06-20 NOTE — Telephone Encounter (Signed)
Left voicemail to return call. Pt okay to schedule 1st Evenity Injection & will need to sit for 15 minutes.   Pt will be responsible for $0 at time of visit.

## 2022-06-21 ENCOUNTER — Other Ambulatory Visit: Payer: Self-pay | Admitting: Internal Medicine

## 2022-06-21 NOTE — Telephone Encounter (Signed)
Pt returned Adrienne Robinson CMA call. Note below was read to her. Pt aware and understood. Call was also transferred. Pt its booked for 5/9 @2 :15pm

## 2022-06-22 ENCOUNTER — Encounter: Payer: Self-pay | Admitting: Nurse Practitioner

## 2022-06-22 ENCOUNTER — Encounter: Payer: Self-pay | Admitting: Oncology

## 2022-06-22 ENCOUNTER — Ambulatory Visit (INDEPENDENT_AMBULATORY_CARE_PROVIDER_SITE_OTHER): Payer: Managed Care, Other (non HMO) | Admitting: Nurse Practitioner

## 2022-06-22 VITALS — BP 110/72 | HR 67 | Temp 97.9°F | Resp 16 | Ht 63.0 in | Wt 106.0 lb

## 2022-06-22 DIAGNOSIS — R1084 Generalized abdominal pain: Secondary | ICD-10-CM

## 2022-06-22 NOTE — Patient Instructions (Signed)
Discussed about diet. Please check your weight at home and send [place] the [place] message.

## 2022-06-22 NOTE — Progress Notes (Signed)
Established Patient Office Visit  Subjective:  Patient ID: Adrienne Robinson, female    DOB: 1955-11-23  Age: 67 y.o. MRN: [id number]  CC:  Chief Complaint  Patient presents with   Generalized Body Aches   Abdominal Pain    HPI  [name] presents for generalized body ache and abdominal pain.  Patient states that she has unintentional weight loss, She is taking Remeron as appetite stimulant.  He has family stressors due to her husband's medical condition.  She is starting a new job and have to travel for the job.  Denies fever, URI symptoms or chest pain.  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   Prepatellar effusion of right knee 06/09/2019   Aspiration done June 13, 2019   Scoliosis    Spinal stenosis of lumbar region    Squamous cell carcinoma of skin 06/24/2018   R lateral calf   Tubular adenoma of colon     Past Surgical History:  Procedure Laterality Date   ABDOMINAL HYSTERECTOMY     APPENDECTOMY     CARDIAC CATHETERIZATION     normal coronaries, no wall motion abnormalities 11/02/09 [facility])   CATARACT EXTRACTION W/PHACO Left 11/24/2020   Procedure: CATARACT EXTRACTION PHACO AND INTRAOCULAR LENS PLACEMENT (IOC) LEFT 6.09 00:46.7;  Surgeon: Lockie Mola, MD;  Location: [facility];  Service: Ophthalmology;  Laterality: Left;  patient wants early   CATARACT EXTRACTION W/PHACO Right 12/08/2020    Procedure: CATARACT EXTRACTION PHACO AND INTRAOCULAR LENS PLACEMENT (IOC) RIGHT 3.92 00:49.0;  Surgeon: Lockie Mola, MD;  Location: [facility];  Service: Ophthalmology;  Laterality: Right;  patient request early   NASAL SINUS SURGERY     OOPHORECTOMY     RADIOLOGY WITH ANESTHESIA  02/26/2012   Procedure: RADIOLOGY WITH ANESTHESIA;  Surgeon: Oneal Grout, MD;  Location: [facility] OR;  Service: Radiology;  Laterality: N/A;   REVERSE SHOULDER ARTHROPLASTY Right 06/27/2013   Procedure: REVERSE RIGHT TOTAL SHOULDER ARTHROPLASTY;  Surgeon: Verlee Rossetti, MD;  Location: [facility] OR;  Service: Orthopedics;  Laterality: Right;   SHOULDER ARTHROSCOPY WITH ROTATOR CUFF REPAIR AND SUBACROMIAL DECOMPRESSION  2007   left   TONSILLECTOMY  adnoids    Family History  Problem Relation Age of Onset   Thyroid disease Mother    Colon cancer Father 45   Diabetes Father    Heart disease Father    Rectal cancer Father    Breast cancer Paternal Grandmother    Colon cancer Paternal Grandfather    Colon cancer Other        pggm   Stomach cancer Neg Hx    Esophageal cancer Neg Hx     Social History   Socioeconomic History   Marital status: Married    Spouse name: Not on file   Number of children: 0  Years of education: 4   Highest education level: Not on file  Occupational History   Occupation: Nurse executive   Tobacco Use   Smoking status: Never   Smokeless tobacco: Never  Vaping Use   Vaping Use: Never used  Substance and Sexual Activity   Alcohol use: Yes    Alcohol/week: 2.0 standard drinks of alcohol    Types: 2 Glasses of wine per week    Comment: 2-3 week with dinner   Drug use: No   Sexual activity: Yes  Other Topics Concern   Not on file  Social History Narrative   Lives at home with her husband.   Right-handed.   2-3 diet cokes per day.   Social Determinants of Health   Financial Resource Strain: Not on file  Food Insecurity: Not on file  Transportation Needs:  Not on file  Physical Activity: Not on file  Stress: Not on file  Social Connections: Not on file  Intimate Partner Violence: Not on file     Outpatient Medications Prior to Visit  Medication Sig Dispense Refill   amLODipine (NORVASC) 10 MG tablet TAKE 1 TABLET BY MOUTH EVERY DAY 90 tablet 3   buPROPion (WELLBUTRIN XL) 150 MG 24 hr tablet TAKE 1 TABLET BY MOUTH EVERY DAY IN THE MORNING 90 tablet 1   cyanocobalamin (,VITAMIN B-12,) 1000 MCG/ML injection INJECT 1 ML (1,000 MCG TOTAL) INTO THE MUSCLE ONCE A WEEK. (Patient taking differently: Inject 1,000 mcg into the muscle every 30 (thirty) days.) 12 mL 6   esomeprazole (NEXIUM) 20 MG capsule TAKE 1 CAPSULE (20 MG TOTAL) BY MOUTH DAILY. **PLEASE CONTACT OFFICE TO SCHEDULE FOLLOW UP 90 capsule 0   famciclovir (FAMVIR) 500 MG tablet Take 1 tablet (500 mg total) by mouth 3 (three) times daily. For one week 21 tablet 2   fluticasone (FLONASE) 50 MCG/ACT nasal spray USE 2 SPRAYS IN EACH NOSTRIL DAILY AS NEEDED 16 g 1   furosemide (LASIX) 20 MG tablet TAKE 1 TABLET (20 MG TOTAL) BY MOUTH DAILY WITH BREAKFAST AS NEEDED FOR EDEMA 90 tablet 1   HYDROcodone-acetaminophen (NORCO/VICODIN) 5-325 MG tablet Take 2 tablets by mouth every 6 (six) hours as needed for moderate pain. 240 tablet 0   Iron-Vitamin C 65-125 MG TABS Take 1 tablet by mouth daily. 90 tablet 1   LINZESS 145 MCG CAPS capsule TAKE 1 CAPSULE BY MOUTH EVERY DAY BEFORE BREAKFAST 90 capsule 0   metoprolol succinate (TOPROL-XL) 25 MG 24 hr tablet TAKE 1 TABLET BY MOUTH EVERY DAY 90 tablet 1   Multiple Vitamin (MULTIVITAMIN) tablet Take 1 tablet by mouth daily.     propranolol (INDERAL) 10 MG tablet TAKE 1/2 TABLET BY MOUTH AS NEEDED FOR ANXIETY 15 tablet 1   sodium chloride 1 g tablet Take 1 tablet (1 g total) by mouth 2 (two) times daily with a meal. 60 tablet 2   Syringe/Needle, Disp, (SYRINGE 3CC/25GX1") 25G X 1" 3 ML MISC Use for b12 injections 50 each 0   zolpidem (AMBIEN CR) 12.5 MG CR  tablet Take 1 tablet (12.5 mg total) by mouth at bedtime as needed. for sleep 30 tablet 5   citalopram (CELEXA) 10 MG tablet TAKE 1 TABLET BY MOUTH EVERY DAY 90 tablet 2   mirtazapine (REMERON) 7.5 MG tablet TAKE 1 TABLET BY MOUTH AT BEDTIME. 90 tablet 1   tiZANidine (ZANAFLEX) 2 MG tablet Take 1 tablet (2 mg total) by mouth every 6 (six) hours as needed for muscle spasms. 30  tablet 0   No facility-administered medications prior to visit.    Allergies  Allergen Reactions   Buspirone Other (See Comments)    Dizziness,  dry mouth    Celebrex [Celecoxib] Swelling   Adhesive [Tape] Rash and Other (See Comments)    Redness, blisters, skin peeling off.   Morphine And Related Rash    ROS Review of Systems  Constitutional: Negative.   HENT: Negative.    Respiratory:  Negative for cough.   Cardiovascular:  Negative for leg swelling.  Gastrointestinal:  Positive for abdominal pain.  Neurological: Negative.   Psychiatric/Behavioral: Negative.        Objective:    Physical Exam Constitutional:      Appearance: She is well-developed.  HENT:     Head: Normocephalic.  Cardiovascular:     Rate and Rhythm: Normal rate and regular rhythm.  Pulmonary:     Effort: Pulmonary effort is normal.     Breath sounds: Normal breath sounds. No stridor. No wheezing.  Abdominal:     General: Bowel sounds are normal.     Tenderness: There is no guarding or rebound.  Skin:    General: Skin is warm.  Neurological:     General: No focal deficit present.     Mental Status: She is alert and oriented to person, place, and time.  Psychiatric:        Mood and Affect: Mood normal.     BP 110/72   Pulse 67   Temp 97.9 F (36.6 C)   Resp 16   Ht 5\' 3"  (1.6 m)   Wt 106 lb (48.1 kg)   SpO2 97%   BMI 18.78 kg/m  Wt Readings from Last 3 Encounters:  06/22/22 106 lb (48.1 kg)  06/09/22 116 lb 12.8 oz (53 kg)  06/06/22 108 lb (49 kg)     Health Maintenance  Topic Date Due   COVID-19 Vaccine  (5 - 2023-24 season) 07/08/2022 (Originally 10/21/2021)   INFLUENZA VACCINE  09/21/2022   MAMMOGRAM  08/25/2023   COLONOSCOPY (Pts 45-64yrs Insurance coverage will need to be confirmed)  11/26/2026   DTaP/Tdap/Td (3 - Td or Tdap) 02/28/2030   Pneumonia Vaccine 41+ Years old  Completed   DEXA SCAN  Completed   Hepatitis C Screening  Completed   Zoster Vaccines- Shingrix  Completed   HPV VACCINES  Aged Out    There are no preventive care reminders to display for this patient.  Lab Results  Component Value Date   TSH 0.979 01/16/2022   Lab Results  Component Value Date   WBC 7.8 06/09/2022   HGB 10.3 (L) 06/09/2022   HCT 30.2 (L) 06/09/2022   MCV 100.0 06/09/2022   PLT 303 06/09/2022   Lab Results  Component Value Date   NA 138 06/09/2022   K 3.6 06/09/2022   CO2 24 06/09/2022   GLUCOSE 99 06/09/2022   BUN 22 06/09/2022   CREATININE 0.74 06/09/2022   BILITOT 0.5 06/09/2022   ALKPHOS 74 06/09/2022   AST 34 06/09/2022   ALT 25 06/09/2022   PROT 7.3 06/09/2022   ALBUMIN 4.2 06/09/2022   CALCIUM 9.0 06/09/2022   ANIONGAP 8 06/09/2022   GFR 58.76 (L) 05/23/2022   Lab Results  Component Value Date   CHOL 206 (H) 11/04/2021   Lab Results  Component Value Date   HDL 57 11/04/2021   Lab Results  Component Value Date   LDLCALC 120 (H) 11/04/2021   Lab Results  Component Value  Date   TRIG 170 (H) 11/04/2021   Lab Results  Component Value Date   CHOLHDL 3.6 11/04/2021   Lab Results  Component Value Date   HGBA1C 5.7 08/04/2011      Assessment & Plan:  There are no diagnoses linked to this encounter.  Follow-up: No follow-ups on file.   Kara Dies, NP

## 2022-06-23 ENCOUNTER — Other Ambulatory Visit: Payer: Self-pay

## 2022-06-23 DIAGNOSIS — R634 Abnormal weight loss: Secondary | ICD-10-CM

## 2022-06-26 ENCOUNTER — Telehealth: Payer: Managed Care, Other (non HMO) | Admitting: Internal Medicine

## 2022-06-26 ENCOUNTER — Encounter: Payer: Self-pay | Admitting: Internal Medicine

## 2022-06-26 VITALS — BP 112/75 | Ht 63.0 in | Wt 111.6 lb

## 2022-06-26 DIAGNOSIS — G894 Chronic pain syndrome: Secondary | ICD-10-CM | POA: Diagnosis not present

## 2022-06-26 DIAGNOSIS — F419 Anxiety disorder, unspecified: Secondary | ICD-10-CM

## 2022-06-26 DIAGNOSIS — F32A Depression, unspecified: Secondary | ICD-10-CM

## 2022-06-26 DIAGNOSIS — F5104 Psychophysiologic insomnia: Secondary | ICD-10-CM | POA: Diagnosis not present

## 2022-06-26 NOTE — Progress Notes (Unsigned)
Virtual Visit via Caregility   Note   This format is felt to be most appropriate for this patient at this time.  All issues noted in this document were discussed and addressed.  No physical exam was performed (except for noted visual exam findings with Video Visits).   I connected withNAME@ on 06/26/22 at  4:30 PM EDT by a video enabled telemedicine application or telephone and verified that I am speaking with the correct person using two identifiers. Location patient: home Location provider: work or home office Persons participating in the virtual visit: patient, provider  I discussed the limitations, risks, security and privacy concerns of performing an evaluation and management service by telephone and the availability of in person appointments. I also discussed with the patient that there may be a patient responsible charge related to this service. The patient expressed understanding and agreed to proceed.  Interactive audio and video telecommunications were attempted between this provider and patient, however failed, due to patient having technical difficulties OR patient did not have access to video capability.  We continued and completed visit with audio only. ***  Reason for visit: ***  HPI: ***   ROS: See pertinent positives and negatives per HPI.  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   Prepatellar effusion of right knee 06/09/2019    Aspiration done June 13, 2019   Scoliosis    Spinal stenosis of lumbar region    Squamous cell carcinoma of skin 06/24/2018   R lateral calf   Tubular adenoma of colon     Past Surgical History:  Procedure Laterality Date   ABDOMINAL HYSTERECTOMY     APPENDECTOMY     CARDIAC CATHETERIZATION     normal coronaries, no wall motion abnormalities 11/02/09 [facility])   CATARACT EXTRACTION W/PHACO Left 11/24/2020   Procedure: CATARACT EXTRACTION PHACO AND INTRAOCULAR LENS PLACEMENT (IOC) LEFT 6.09 00:46.7;  Surgeon: Lockie Mola, MD;  Location: [facility];  Service: Ophthalmology;  Laterality: Left;  patient wants early   CATARACT EXTRACTION W/PHACO Right 12/08/2020   Procedure: CATARACT EXTRACTION PHACO AND INTRAOCULAR LENS PLACEMENT (IOC) RIGHT 3.92 00:49.0;  Surgeon: Lockie Mola, MD;  Location: [facility];  Service: Ophthalmology;  Laterality: Right;  patient request early   NASAL SINUS SURGERY     OOPHORECTOMY     RADIOLOGY WITH ANESTHESIA  02/26/2012   Procedure: RADIOLOGY WITH ANESTHESIA;  Surgeon: Oneal Grout, MD;  Location: [facility] OR;  Service: Radiology;  Laterality: N/A;   REVERSE SHOULDER ARTHROPLASTY Right 06/27/2013   Procedure: REVERSE RIGHT TOTAL SHOULDER ARTHROPLASTY;  Surgeon: Verlee Rossetti, MD;  Location: [facility] OR;  Service: Orthopedics;  Laterality: Right;   SHOULDER ARTHROSCOPY WITH ROTATOR CUFF REPAIR AND SUBACROMIAL DECOMPRESSION  2007   left   TONSILLECTOMY  adnoids    Family History  Problem Relation Age of Onset  Thyroid disease Mother    Colon cancer Father 81   Diabetes Father    Heart disease Father    Rectal cancer Father    Breast cancer Paternal Grandmother    Colon cancer Paternal Grandfather    Colon cancer Other        pggm   Stomach cancer Neg Hx    Esophageal cancer Neg Hx     SOCIAL HX: ***   Current Outpatient Medications:    amLODipine (NORVASC) 10 MG tablet, TAKE 1 TABLET BY MOUTH EVERY DAY, Disp: 90  tablet, Rfl: 3   buPROPion (WELLBUTRIN XL) 150 MG 24 hr tablet, TAKE 1 TABLET BY MOUTH EVERY DAY IN THE MORNING, Disp: 90 tablet, Rfl: 1   cyanocobalamin (,VITAMIN B-12,) 1000 MCG/ML injection, INJECT 1 ML (1,000 MCG TOTAL) INTO THE MUSCLE ONCE A WEEK. (Patient taking differently: Inject 1,000 mcg into the muscle every 30 (thirty) days.), Disp: 12 mL, Rfl: 6   esomeprazole (NEXIUM) 20 MG capsule, TAKE 1 CAPSULE (20 MG TOTAL) BY MOUTH DAILY. **PLEASE CONTACT OFFICE TO SCHEDULE FOLLOW UP, Disp: 90 capsule, Rfl: 0   famciclovir (FAMVIR) 500 MG tablet, Take 1 tablet (500 mg total) by mouth 3 (three) times daily. For one week, Disp: 21 tablet, Rfl: 2   fluticasone (FLONASE) 50 MCG/ACT nasal spray, USE 2 SPRAYS IN EACH NOSTRIL DAILY AS NEEDED, Disp: 16 g, Rfl: 1   furosemide (LASIX) 20 MG tablet, TAKE 1 TABLET (20 MG TOTAL) BY MOUTH DAILY WITH BREAKFAST AS NEEDED FOR EDEMA, Disp: 90 tablet, Rfl: 1   HYDROcodone-acetaminophen (NORCO/VICODIN) 5-325 MG tablet, Take 2 tablets by mouth every 6 (six) hours as needed for moderate pain., Disp: 240 tablet, Rfl: 0   Iron-Vitamin C 65-125 MG TABS, Take 1 tablet by mouth daily., Disp: 90 tablet, Rfl: 1   LINZESS 145 MCG CAPS capsule, TAKE 1 CAPSULE BY MOUTH EVERY DAY BEFORE BREAKFAST, Disp: 90 capsule, Rfl: 0   metoprolol succinate (TOPROL-XL) 25 MG 24 hr tablet, TAKE 1 TABLET BY MOUTH EVERY DAY, Disp: 90 tablet, Rfl: 1   Multiple Vitamin (MULTIVITAMIN) tablet, Take 1 tablet by mouth daily., Disp: , Rfl:    propranolol (INDERAL) 10 MG tablet, TAKE 1/2 TABLET BY MOUTH AS NEEDED FOR ANXIETY, Disp: 15 tablet, Rfl: 1   sodium chloride 1 g tablet, Take 1 tablet (1 g total) by mouth 2 (two) times daily with a meal., Disp: 60 tablet, Rfl: 2   Syringe/Needle, Disp, (SYRINGE 3CC/25GX1") 25G X 1" 3 ML MISC, Use for b12 injections, Disp: 50 each, Rfl: 0   zolpidem (AMBIEN CR) 12.5 MG CR tablet, Take 1 tablet (12.5 mg total) by mouth at bedtime as needed. for sleep, Disp: 30  tablet, Rfl: 5  EXAM:  VITALS per patient if applicable:  GENERAL: alert, oriented, appears well and in no acute distress  HEENT: atraumatic, conjunttiva clear, no obvious abnormalities on inspection of external nose and ears  NECK: normal movements of the head and neck  LUNGS: on inspection no signs of respiratory distress, breathing rate appears normal, no obvious gross SOB, gasping or wheezing  CV: no obvious cyanosis  MS: moves all visible extremities without noticeable abnormality  PSYCH/NEURO: pleasant and cooperative, no obvious depression or anxiety, speech and thought processing grossly intact  ASSESSMENT AND PLAN: There are no diagnoses linked to this encounter.    I discussed the assessment and treatment plan with the patient. The patient was provided an opportunity to ask questions and all were answered. The  patient agreed with the plan and demonstrated an understanding of the instructions.   The patient was advised to call back or seek an in-person evaluation if the symptoms worsen or if the condition fails to improve as anticipated.   I spent 30 minutes dedicated to the care of this patient on the date of this encounter to include pre-visit review of his medical history,  Face-to-face time with the patient , and post visit ordering of testing and therapeutics.    [name], MD

## 2022-06-27 ENCOUNTER — Ambulatory Visit: Payer: Managed Care, Other (non HMO) | Admitting: Dermatology

## 2022-06-27 DIAGNOSIS — L578 Other skin changes due to chronic exposure to nonionizing radiation: Secondary | ICD-10-CM

## 2022-06-27 DIAGNOSIS — L988 Other specified disorders of the skin and subcutaneous tissue: Secondary | ICD-10-CM

## 2022-06-27 DIAGNOSIS — Z1283 Encounter for screening for malignant neoplasm of skin: Secondary | ICD-10-CM | POA: Diagnosis not present

## 2022-06-27 DIAGNOSIS — D229 Melanocytic nevi, unspecified: Secondary | ICD-10-CM

## 2022-06-27 DIAGNOSIS — Z85828 Personal history of other malignant neoplasm of skin: Secondary | ICD-10-CM

## 2022-06-27 DIAGNOSIS — L565 Disseminated superficial actinic porokeratosis (DSAP): Secondary | ICD-10-CM

## 2022-06-27 DIAGNOSIS — L570 Actinic keratosis: Secondary | ICD-10-CM | POA: Diagnosis not present

## 2022-06-27 DIAGNOSIS — Z872 Personal history of diseases of the skin and subcutaneous tissue: Secondary | ICD-10-CM

## 2022-06-27 DIAGNOSIS — X32XXXA Exposure to sunlight, initial encounter: Secondary | ICD-10-CM

## 2022-06-27 DIAGNOSIS — L820 Inflamed seborrheic keratosis: Secondary | ICD-10-CM

## 2022-06-27 DIAGNOSIS — L814 Other melanin hyperpigmentation: Secondary | ICD-10-CM

## 2022-06-27 DIAGNOSIS — W908XXA Exposure to other nonionizing radiation, initial encounter: Secondary | ICD-10-CM

## 2022-06-27 DIAGNOSIS — L821 Other seborrheic keratosis: Secondary | ICD-10-CM

## 2022-06-27 DIAGNOSIS — D225 Melanocytic nevi of trunk: Secondary | ICD-10-CM

## 2022-06-27 LAB — INTELLIGEN MYELOID

## 2022-06-27 MED ORDER — HYDROCODONE-ACETAMINOPHEN 5-325 MG PO TABS: 2.0000 | ORAL_TABLET | Freq: Four times a day (QID) | ORAL | 0 refills | Status: DC | PRN

## 2022-06-27 NOTE — Patient Instructions (Signed)
Cryotherapy Aftercare  Wash gently with soap and water everyday.   Apply Vaseline and Band-Aid daily until healed.    Melanoma ABCDEs  Melanoma is the most dangerous type of skin cancer, and is the leading cause of death from skin disease.  You are more likely to develop melanoma if you: Have light-colored skin, light-colored eyes, or red or blond hair Spend a lot of time in the sun Tan regularly, either outdoors or in a tanning bed Have had blistering sunburns, especially during childhood Have a close family member who has had a melanoma Have atypical moles or large birthmarks  Early detection of melanoma is key since treatment is typically straightforward and cure rates are extremely high if we catch it early.   The first sign of melanoma is often a change in a mole or a new dark spot.  The ABCDE system is a way of remembering the signs of melanoma.  A for asymmetry:  The two halves do not match. B for border:  The edges of the growth are irregular. C for color:  A mixture of colors are present instead of an even brown color. D for diameter:  Melanomas are usually (but not always) greater than 6mm - the size of a pencil eraser. E for evolution:  The spot keeps changing in size, shape, and color.  Please check your skin once per month between visits. You can use a small mirror in front and a large mirror behind you to keep an eye on the back side or your body.   If you see any new or changing lesions before your next follow-up, please call to schedule a visit.  Please continue daily skin protection including broad spectrum sunscreen SPF 30+ to sun-exposed areas, reapplying every 2 hours as needed when you're outdoors.    Due to recent changes in healthcare laws, you may see results of your pathology and/or laboratory studies on MyChart before the doctors have had a chance to review them. We understand that in some cases there may be results that are confusing or concerning to you.  Please understand that not all results are received at the same time and often the doctors may need to interpret multiple results in order to provide you with the best plan of care or course of treatment. Therefore, we ask that you please give us 2 business days to thoroughly review all your results before contacting the office for clarification. Should we see a critical lab result, you will be contacted sooner.   If You Need Anything After Your Visit  If you have any questions or concerns for your doctor, please call our main line at [phone] and press option 4 to reach your doctor's medical assistant. If no one answers, please leave a voicemail as directed and we will return your call as soon as possible. Messages left after 4 pm will be answered the following business day.   You may also send us a message via MyChart. We typically respond to MyChart messages within 1-2 business days.  For prescription refills, please ask your pharmacy to contact our office. Our fax number is [phone].  If you have an urgent issue when the clinic is closed that cannot wait until the next business day, you can page your doctor at the number below.    Please note that while we do our best to be available for urgent issues outside of office hours, we are not available 24/7.   If you have an urgent   issue and are unable to reach us, you may choose to seek medical care at your doctor's office, retail clinic, urgent care center, or emergency room.  If you have a medical emergency, please immediately call 911 or go to the emergency department.  Pager Numbers  - Dr. Kowalski: [phone]  - Dr. Moye: [phone]  - Dr. Stewart: [phone]  In the event of inclement weather, please call our main line at [phone] for an update on the status of any delays or closures.  Dermatology Medication Tips: Please keep the boxes that topical medications come in in order to help keep track of the  instructions about where and how to use these. Pharmacies typically print the medication instructions only on the boxes and not directly on the medication tubes.   If your medication is too expensive, please contact our office at [phone] option 4 or send us a message through MyChart.   We are unable to tell what your co-pay for medications will be in advance as this is different depending on your insurance coverage. However, we may be able to find a substitute medication at lower cost or fill out paperwork to get insurance to cover a needed medication.   If a prior authorization is required to get your medication covered by your insurance company, please allow us 1-2 business days to complete this process.  Drug prices often vary depending on where the prescription is filled and some pharmacies may offer cheaper prices.  The website www.goodrx.com contains coupons for medications through different pharmacies. The prices here do not account for what the cost may be with help from insurance (it may be cheaper with your insurance), but the website can give you the price if you did not use any insurance.  - You can print the associated coupon and take it with your prescription to the pharmacy.  - You may also stop by our office during regular business hours and pick up a GoodRx coupon card.  - If you need your prescription sent electronically to a different pharmacy, notify our office through [facility] MyChart or by phone at [phone] option 4.  

## 2022-06-27 NOTE — Assessment & Plan Note (Signed)
She is using pharmcologic and non pharmacologic methods to manage her arthritis pain  .  she is using a standing desk for work mode and opioids for non operable pain.  She has not had any ER visits for pain management and has not requested any early refills.  Her Refill history was confirmed via [place] Controlled Substance database by me today during her visit and there have been no prescriptions of controlled substances filled from any providers other than me. Refilling for 3 months at a time.

## 2022-06-27 NOTE — Progress Notes (Signed)
Follow-Up Visit   Subjective  Adrienne Robinson is a 67 y.o. female who presents for the following: Skin Cancer Screening and Full Body Skin Exam  The patient presents for Total-Body Skin Exam (TBSE) for skin cancer screening and mole check. The patient has spots, moles and lesions to be evaluated, some may be new or changing and the patient has concerns that these could be cancer.  Hx BCC, AK's, DSAP. Patient uses cholesterol cream nightly to areas of DSAP.   The following portions of the chart were reviewed this encounter and updated as appropriate: medications, allergies, medical history  Review of Systems:  No other skin or systemic complaints except as noted in HPI or Assessment and Plan.  Objective  Well appearing patient in no apparent distress; mood and affect are within normal limits.  A full examination was performed including scalp, head, eyes, ears, nose, lips, neck, chest, axillae, abdomen, back, buttocks, bilateral upper extremities, bilateral lower extremities, hands, feet, fingers, toes, fingernails, and toenails. All findings within normal limits unless otherwise noted below.   Relevant physical exam findings are noted in the Assessment and Plan.  L anterior axilla x 1, L upper arm x 1, L wrist x 1, L forearm x 1, R medial lower leg x 1, vertex scalp x 1 (6) Erythematous stuck-on, waxy papule or plaque, 1.5 cm at vertex scalp  legs Pink scaly spots   L pretibia x 7 (7) Keratotic macules  upper lip Rhytides and volume loss.     Assessment & Plan   LENTIGINES, SEBORRHEIC KERATOSES, HEMANGIOMAS - Benign normal skin lesions - Benign-appearing - Call for any changes  MELANOCYTIC NEVI - Tan-brown and/or pink-flesh-colored symmetric macules and papules - Benign appearing on exam today - Observation - Call clinic for new or changing moles - Recommend daily use of broad spectrum spf 30+ sunscreen to sun-exposed areas.   ACTINIC DAMAGE - Chronic condition,  secondary to cumulative UV/sun exposure - diffuse scaly erythematous macules with underlying dyspigmentation - Recommend daily broad spectrum sunscreen SPF 30+ to sun-exposed areas, reapply every 2 hours as needed.  - Staying in the shade or wearing long sleeves, sun glasses (UVA+UVB protection) and wide brim hats (4-inch brim around the entire circumference of the hat) are also recommended for sun protection.  - Call for new or changing lesions.  SKIN CANCER SCREENING PERFORMED TODAY.    Inflamed seborrheic keratosis (6) L anterior axilla x 1, L upper arm x 1, L wrist x 1, L forearm x 1, R medial lower leg x 1, vertex scalp x 1  Symptomatic, irritating, patient would like treated.  Recurrent ISK vs hypertrophic AK at left upper arm (previously frozen). Recheck on follow up.   Destruction of lesion - L anterior axilla x 1, L upper arm x 1, L wrist x 1, L forearm x 1, R medial lower leg x 1, vertex scalp x 1  Destruction method: cryotherapy   Informed consent: discussed and consent obtained   Lesion destroyed using liquid nitrogen: Yes   Region frozen until ice ball extended beyond lesion: Yes   Outcome: patient tolerated procedure well with no complications   Post-procedure details: wound care instructions given   Additional details:  Prior to procedure, discussed risks of blister formation, small wound, skin dyspigmentation, or rare scar following cryotherapy. Recommend Vaseline ointment to treated areas while healing.   Disseminated superficial actinic porokeratosis (DSAP) legs  Chronic and persistent condition with duration or expected duration over one year. Condition  is symptomatic/ bothersome to patient. Not currently at goal.   DSAP is a chronic inherited condition of sun-exposed skin, most commonly affecting the arms and legs.  It is difficult to treat.  Recommend photoprotection and regular use of spf 30 or higher sunscreen to prevent worsening of condition and precancerous  changes.  continue Cholesterol 2% Lovastatin 2% Cream 240 gm- Apply 1-2x daily as directed to affected areas arms and legs.  Sent to [facility]- pt has   AK (actinic keratosis) (7) L pretibia x 7  Hypertrophic  Actinic keratoses are precancerous spots that appear secondary to cumulative UV radiation exposure/sun exposure over time. They are chronic with expected duration over 1 year. A portion of actinic keratoses will progress to squamous cell carcinoma of the skin. It is not possible to reliably predict which spots will progress to skin cancer and so treatment is recommended to prevent development of skin cancer.  Recommend daily broad spectrum sunscreen SPF 30+ to sun-exposed areas, reapply every 2 hours as needed.  Recommend staying in the shade or wearing long sleeves, sun glasses (UVA+UVB protection) and wide brim hats (4-inch brim around the entire circumference of the hat). Call for new or changing lesions.   Destruction of lesion - L pretibia x 7  Destruction method: cryotherapy   Informed consent: discussed and consent obtained   Lesion destroyed using liquid nitrogen: Yes   Region frozen until ice ball extended beyond lesion: Yes   Outcome: patient tolerated procedure well with no complications   Post-procedure details: wound care instructions given   Additional details:  Prior to procedure, discussed risks of blister formation, small wound, skin dyspigmentation, or rare scar following cryotherapy. Recommend Vaseline ointment to treated areas while healing.   Elastosis of skin upper lip  Recommend Botox for lines at upper lip. Estimate 6 units for $78. Patient advised filler is recommended for corners of mouth/marionette lines $650/syringe Restylane Defyne, could also do lips 0.55 cc syringe Volbella $525.   HISTORY OF BASAL CELL CARCINOMA OF THE SKIN - No evidence of recurrence today at left postauricular neck - Recommend regular full body skin exams - Recommend  daily broad spectrum sunscreen SPF 30+ to sun-exposed areas, reapply every 2 hours as needed.  - Call if any new or changing lesions are noted between office visits  HISTORY OF PRECANCEROUS ACTINIC KERATOSIS - site(s) of PreCancerous Actinic Keratosis clear today at left cheek. - these may recur and new lesions may form requiring treatment to prevent transformation into skin cancer - observe for new or changing spots and contact [facility] for appointment if occur - photoprotection with sun protective clothing; sunglasses and broad spectrum sunscreen with SPF of at least 30 + and frequent self skin exams recommended - yearly exams by a dermatologist recommended for persons with history of PreCancerous Actinic Keratoses  MELANOCYTIC NEVI Exam: 2 mm medium dark brown macule at spinal mid back  Treatment Plan: Benign appearing on exam today. Recommend observation. Call clinic for new or changing moles. Recommend daily use of broad spectrum spf 30+ sunscreen to sun-exposed areas.    Return for ISK follow up, AK follow up 2-3 months and maybe Botox at upper lip, 6 month TBSE.  Anise Salvo, RMA, am acting as scribe for Willeen Niece, MD .   Documentation: I have reviewed the above documentation for accuracy and completeness, and I agree with the above.  Willeen Niece, MD

## 2022-06-27 NOTE — Assessment & Plan Note (Signed)
Her dose of ambien CR was reduced by her pharmacy.  Advised to add remeron at night.

## 2022-06-27 NOTE — Assessment & Plan Note (Addendum)
With new onset weight loss . Continue wellbutrin,  did not tolerate  paxil .  Encouarged to restart remeron  in the evening and advance dose from 7.5 mg to 15 mg after one week

## 2022-06-29 ENCOUNTER — Ambulatory Visit: Payer: Managed Care, Other (non HMO)

## 2022-07-03 ENCOUNTER — Ambulatory Visit: Payer: Managed Care, Other (non HMO)

## 2022-07-03 NOTE — Progress Notes (Deleted)
Pt presented for their subcutaneous EVENITY injection. Pt was identified through two identifiers. Pt was given the information packets about the EVENITY.  Pt tolerated the subq injection well in the left or right arm.

## 2022-07-04 ENCOUNTER — Ambulatory Visit (INDEPENDENT_AMBULATORY_CARE_PROVIDER_SITE_OTHER): Payer: Managed Care, Other (non HMO)

## 2022-07-04 DIAGNOSIS — M810 Age-related osteoporosis without current pathological fracture: Secondary | ICD-10-CM

## 2022-07-04 MED ORDER — ROMOSOZUMAB-AQQG 105 MG/1.17ML ~~LOC~~ SOSY
210.0000 mg | PREFILLED_SYRINGE | Freq: Once | SUBCUTANEOUS | Status: AC
Start: 2022-07-04 — End: 2022-07-04
  Administered 2022-07-04: 210 mg via SUBCUTANEOUS

## 2022-07-04 NOTE — Progress Notes (Signed)
Patient presented for Evenity  injection to left  and right  arm, patient voiced no concerns nor showed any signs of distress during injection . Had pt to sit for 15 mins after injection . Pt tolerated well

## 2022-07-07 NOTE — Telephone Encounter (Signed)
No, to all questions.

## 2022-07-07 NOTE — Telephone Encounter (Signed)
Can you please follow up on CT. It was entered on 5/3 with expected date 5/17.

## 2022-07-08 ENCOUNTER — Encounter: Payer: Self-pay | Admitting: Oncology

## 2022-07-08 DIAGNOSIS — R1084 Generalized abdominal pain: Secondary | ICD-10-CM | POA: Insufficient documentation

## 2022-07-08 NOTE — Assessment & Plan Note (Signed)
Advised patient to download close clinical intake. Advised to make a food diary. Symptoms not improving call the office for further evaluation.

## 2022-07-18 ENCOUNTER — Ambulatory Visit
Admission: RE | Admit: 2022-07-18 | Discharge: 2022-07-18 | Disposition: A | Discharge status: Home / Self Care | Payer: Managed Care, Other (non HMO) | Source: Ambulatory Visit | Attending: Oncology | Admitting: Oncology

## 2022-07-18 DIAGNOSIS — R634 Abnormal weight loss: Secondary | ICD-10-CM

## 2022-07-18 MED ORDER — IOPAMIDOL (ISOVUE-300) INJECTION 61%
100.0000 mL | Freq: Once | INTRAVENOUS | Status: AC | PRN
  Administered 2022-07-18: 100 mL via INTRAVENOUS

## 2022-07-19 ENCOUNTER — Other Ambulatory Visit: Payer: Managed Care, Other (non HMO)

## 2022-07-24 ENCOUNTER — Telehealth: Payer: Self-pay | Admitting: Internal Medicine

## 2022-07-24 ENCOUNTER — Encounter: Payer: Self-pay | Admitting: Internal Medicine

## 2022-07-24 DIAGNOSIS — F5104 Psychophysiologic insomnia: Secondary | ICD-10-CM

## 2022-07-24 MED ORDER — ZOLPIDEM TARTRATE 10 MG PO TABS: 10.0000 mg | ORAL_TABLET | Freq: Every evening | ORAL | 0 refills | Status: DC | PRN

## 2022-07-24 NOTE — Assessment & Plan Note (Signed)
30 day supply of IR ambien sent due to CR being out of stock

## 2022-07-24 NOTE — Telephone Encounter (Signed)
Pt called stating the zolpidem tartrate extended release is out of stock/ back order so the pharmacy told the pt to ask for a script of Regular zolpidem 10mg 

## 2022-07-24 NOTE — Telephone Encounter (Signed)
LMTCB. Need to let pt know that the regular zolpidem has been sent to pharmacy for a 30 day supply.

## 2022-07-24 NOTE — Telephone Encounter (Signed)
Telephone encounter has already been sent to provider in regards to same concern.

## 2022-07-26 NOTE — Telephone Encounter (Signed)
Sent mychart message

## 2022-07-29 ENCOUNTER — Encounter: Payer: Self-pay | Admitting: Internal Medicine

## 2022-07-29 DIAGNOSIS — F32A Depression, unspecified: Secondary | ICD-10-CM

## 2022-07-31 ENCOUNTER — Ambulatory Visit: Payer: Managed Care, Other (non HMO) | Admitting: Dermatology

## 2022-08-01 MED ORDER — BUPROPION HCL ER (XL) 300 MG PO TB24: 300.0000 mg | ORAL_TABLET | Freq: Every day | ORAL | 1 refills | Status: DC

## 2022-08-14 ENCOUNTER — Telehealth: Payer: Self-pay | Admitting: *Deleted

## 2022-08-14 NOTE — Telephone Encounter (Signed)
Left voicemail for pt to call & schedule her monthly Evenity. Pt due as of 08/04/22.  $0 due; PA not required

## 2022-08-15 NOTE — Progress Notes (Addendum)
[name] [address] [place] [address] Phone: [phone] Subjective:   Bruce Donath, am serving as a scribe for Dr. Antoine Primas.  I'm seeing this patient by the request  of:  Sherlene Shams, MD  CC: Neck pain, back pain  GLO:[id number]  06/06/2022 Will monitor and see if we need to repeat again   Multiple areas noted in the thoracic spine. This includes the latissimus dorsi, rhomboids, as well as spinalis musculature. Patient responded well to the injections. Had some improvement almost immediately. Discussed with patient the underlying spinal stenosis is likely not helping at the moment. Increase activity slowly. Follow-up with me again in 10 weeks.   Patient given injection and tolerated the procedure extremely well.  Discussed with patient about icing regimen and home exercises.  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  We discussed with patient about icing regimen and home exercises, discussed potentially bracing at night.  Follow-up with me again 10 weeks for further evaluation and treatment.     Updated 08/17/2022 Adrienne Robinson is a 67 y.o. female coming in with complaint of L shoulder and thumb pain. Also c/o lower back pain since yesterday. Pain radiates into the glute but she states that it feels the same as it has prior. Last epidural October 2023. Last location of epidural was most effective.        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   Prepatellar effusion of right knee 06/09/2019   Aspiration done June 13, 2019   Scoliosis    Spinal stenosis of lumbar region    Squamous cell carcinoma of skin 06/24/2018   R lateral calf   Tubular adenoma of colon    Past Surgical History:  Procedure Laterality Date   ABDOMINAL HYSTERECTOMY     APPENDECTOMY     CARDIAC CATHETERIZATION     normal coronaries, no wall motion abnormalities 11/02/09 [facility])   CATARACT EXTRACTION W/PHACO Left 11/24/2020   Procedure: CATARACT EXTRACTION PHACO AND INTRAOCULAR LENS PLACEMENT (IOC) LEFT 6.09 00:46.7;  Surgeon: Lockie Mola, MD;  Location: [facility];  Service: Ophthalmology;  Laterality: Left;  patient wants early   CATARACT EXTRACTION W/PHACO Right 12/08/2020   Procedure: CATARACT EXTRACTION PHACO AND INTRAOCULAR LENS PLACEMENT (IOC) RIGHT 3.92 00:49.0;  Surgeon: Lockie Mola, MD;  Location: [facility];  Service: Ophthalmology;  Laterality: Right;  patient request early   NASAL SINUS SURGERY     OOPHORECTOMY     RADIOLOGY WITH ANESTHESIA  02/26/2012   Procedure: RADIOLOGY WITH ANESTHESIA;  Surgeon: Oneal Grout, MD;  Location: [facility] OR;  Service: Radiology;  Laterality: N/A;   REVERSE SHOULDER ARTHROPLASTY Right  06/27/2013   Procedure: REVERSE RIGHT TOTAL SHOULDER ARTHROPLASTY;  Surgeon: Verlee Rossetti, MD;  Location: [facility] OR;  Service: Orthopedics;  Laterality: Right;   SHOULDER ARTHROSCOPY WITH ROTATOR CUFF REPAIR AND SUBACROMIAL DECOMPRESSION  2007   left   TONSILLECTOMY  adnoids   Social History   Socioeconomic History   Marital status: Married    Spouse name: Not on file   Number of children: 0   Years of education: 18   Highest education level: Not on file  Occupational History    Occupation: Nurse executive   Tobacco Use   Smoking status: Never   Smokeless tobacco: Never  Vaping Use   Vaping Use: Never used  Substance and Sexual Activity   Alcohol use: Yes    Alcohol/week: 2.0 standard drinks of alcohol    Types: 2 Glasses of wine per week    Comment: 2-3 week with dinner   Drug use: No   Sexual activity: Yes  Other Topics Concern   Not on file  Social History Narrative   Lives at home with her husband.   Right-handed.   2-3 diet cokes per day.   Social Determinants of Health   Financial Resource Strain: Not on file  Food Insecurity: Not on file  Transportation Needs: Not on file  Physical Activity: Not on file  Stress: Not on file  Social Connections: Not on file   Allergies  Allergen Reactions   Buspirone Other (See Comments)    Dizziness,  dry mouth    Celebrex [Celecoxib] Swelling   Adhesive [Tape] Rash and Other (See Comments)    Redness, blisters, skin peeling off.   Morphine And Codeine Rash   Family History  Problem Relation Age of Onset   Thyroid disease Mother    Colon cancer Father 38   Diabetes Father    Heart disease Father    Rectal cancer Father    Breast cancer Paternal Grandmother    Colon cancer Paternal Grandfather    Colon cancer Other        pggm   Stomach cancer Neg Hx    Esophageal cancer Neg Hx      Current Outpatient Medications (Cardiovascular):    amLODipine (NORVASC) 10 MG tablet, TAKE 1 TABLET BY MOUTH EVERY DAY   furosemide (LASIX) 20 MG tablet, TAKE 1 TABLET (20 MG TOTAL) BY MOUTH DAILY WITH BREAKFAST AS NEEDED FOR EDEMA   metoprolol succinate (TOPROL-XL) 25 MG 24 hr tablet, TAKE 1 TABLET BY MOUTH EVERY DAY   propranolol (INDERAL) 10 MG tablet, TAKE 1/2 TABLET BY MOUTH AS NEEDED FOR ANXIETY  Current Outpatient Medications (Respiratory):    fluticasone (FLONASE) 50 MCG/ACT nasal spray, USE 2 SPRAYS IN EACH NOSTRIL DAILY AS NEEDED  Current Outpatient Medications (Analgesics):     HYDROcodone-acetaminophen (NORCO/VICODIN) 5-325 MG tablet, Take 2 tablets by mouth every 6 (six) hours as needed for moderate pain.  Current Outpatient Medications (Hematological):    cyanocobalamin (,VITAMIN B-12,) 1000 MCG/ML injection, INJECT 1 ML (1,000 MCG TOTAL) INTO THE MUSCLE ONCE A WEEK. (Patient taking differently: Inject 1,000 mcg into the muscle every 30 (thirty) days.)   Iron-Vitamin C 65-125 MG TABS, Take 1 tablet by mouth daily.  Current Outpatient Medications (Other):    buPROPion (WELLBUTRIN XL) 300 MG 24 hr tablet, Take 1 tablet (300 mg total) by mouth daily.   esomeprazole (NEXIUM) 20 MG capsule, TAKE 1 CAPSULE (20 MG TOTAL) BY MOUTH DAILY. **PLEASE CONTACT OFFICE TO SCHEDULE FOLLOW UP   famciclovir (  FAMVIR) 500 MG tablet, Take 1 tablet (500 mg total) by mouth 3 (three) times daily. For one week   LINZESS 145 MCG CAPS capsule, TAKE 1 CAPSULE BY MOUTH EVERY DAY BEFORE BREAKFAST   Multiple Vitamin (MULTIVITAMIN) tablet, Take 1 tablet by mouth daily.   sodium chloride 1 g tablet, Take 1 tablet (1 g total) by mouth 2 (two) times daily with a meal.   Syringe/Needle, Disp, (SYRINGE 3CC/25GX1") 25G X 1" 3 ML MISC, Use for b12 injections   zolpidem (AMBIEN) 10 MG tablet, Take 1 tablet (10 mg total) by mouth at bedtime as needed for sleep.   Reviewed prior external information including notes and imaging from  primary care provider As well as notes that were available from care everywhere and other healthcare systems.  Past medical history, social, surgical and family history all reviewed in electronic medical record.  No pertanent information unless stated regarding to the chief complaint.   Review of Systems:  No headache, visual changes, nausea, vomiting, diarrhea, constipation, dizziness, abdominal pain, skin rash, fevers, chills, night sweats, weight loss, swollen lymph nodes, body aches, chest pain, shortness of breath, mood changes. POSITIVE muscle aches, joint  swelling  Objective  Blood pressure 112/72, pulse (!) 56, height 5\' 3"  (1.6 m), weight 111 lb (50.3 kg), SpO2 96 %.   General: No apparent distress alert and oriented x3 mood and affect normal, dressed appropriately.  HEENT: Pupils equal, extraocular movements intact  Respiratory: Patient's speak in full sentences and does not appear short of breath  Cardiovascular: No lower extremity edema, non tender, no erythema  Significant arthritic changes in multiple areas.  Multiple trigger points noted in the left shoulder, thoracic area as well as the lower back some.  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  Tender to palpation diffusely.  After verbal consent patient was prepped with alcohol swab and with a 25-gauge half inch needle 3 were in the left shoulder region in the trapezius, latissimus dorsi levator scapula.  The thoracic spine seems to be in the spinous, latissimus dorsi as well as.  A total of 3 cc of 0.5% Marcaine and 1 cc of Kenalog 40 mg/mL  Procedure: Real-time Ultrasound Guided Injection of left CMC joint Device: GE Logiq Q7 Ultrasound guided injection is preferred based studies that show increased duration, increased effect, greater accuracy, decreased procedural pain, increased response rate, and decreased cost with ultrasound guided versus blind injection.  Verbal informed consent obtained.  Time-out conducted.  Noted no overlying erythema, induration, or other signs of local infection.  Skin prepped in a sterile fashion.  Local anesthesia: Topical Ethyl chloride.  With sterile technique and under real time ultrasound guidance: With a 25-gauge half inch needle injected with 0.5 cc of 0.5% Marcaine and 0.5 cc of Kenalog 40 mg/mL Completed without difficulty  Pain immediately improved suggesting accurate placement of the medication.  Advised to call if fevers/chills, erythema, induration, drainage, or  persistent bleeding.  Impression: Technically successful ultrasound guided injection.    Impression and Recommendations:    The above documentation has been reviewed and is accurate and complete Judi Saa, DO

## 2022-08-17 ENCOUNTER — Ambulatory Visit: Payer: Managed Care, Other (non HMO) | Admitting: Family Medicine

## 2022-08-17 ENCOUNTER — Encounter: Payer: Self-pay | Admitting: Family Medicine

## 2022-08-17 ENCOUNTER — Other Ambulatory Visit: Payer: Self-pay

## 2022-08-17 VITALS — BP 112/72 | HR 56 | Ht 63.0 in | Wt 111.0 lb

## 2022-08-17 DIAGNOSIS — M1812 Unilateral primary osteoarthritis of first carpometacarpal joint, left hand: Secondary | ICD-10-CM

## 2022-08-17 DIAGNOSIS — M79645 Pain in left finger(s): Secondary | ICD-10-CM

## 2022-08-17 DIAGNOSIS — M5416 Radiculopathy, lumbar region: Secondary | ICD-10-CM

## 2022-08-17 DIAGNOSIS — M546 Pain in thoracic spine: Secondary | ICD-10-CM

## 2022-08-17 DIAGNOSIS — G8929 Other chronic pain: Secondary | ICD-10-CM

## 2022-08-17 DIAGNOSIS — M25512 Pain in left shoulder: Secondary | ICD-10-CM | POA: Diagnosis not present

## 2022-08-17 NOTE — Assessment & Plan Note (Signed)
Worsening symptoms again.  Discussed icing regimen and home exercises, which activities to do and which ones to avoid.  Increase activity slowly.  Follow-up again in 6 to 8 weeks

## 2022-08-17 NOTE — Patient Instructions (Addendum)
Trigger points in 2 diff spots today Injected thumb Epidural [id number] See me again in 10 weeks

## 2022-08-17 NOTE — Assessment & Plan Note (Signed)
Chronic, with worsening symptoms.  Still trying to avoid surgical intervention.  Is nearly bone-on-bone.  He does respond well to the injection.  States that she gets approximately 2 months of improvement.  Hopeful that patient will get some relief.  Encouraged still bracing at night

## 2022-08-17 NOTE — Assessment & Plan Note (Signed)
Chronic problem with worsening symptoms. Significant arthritic changes in multiple different areas.  Joint just like this symptomatically She can continue to be as active as possible.  Warned of potential side effects.  Patient should do well with this.  Increase activity as well.  Discussed scapular strengthening.  Follow-up again in 6 to 8 weeks

## 2022-08-21 ENCOUNTER — Telehealth: Payer: Self-pay

## 2022-08-22 ENCOUNTER — Ambulatory Visit (INDEPENDENT_AMBULATORY_CARE_PROVIDER_SITE_OTHER): Payer: Managed Care, Other (non HMO)

## 2022-08-22 DIAGNOSIS — M810 Age-related osteoporosis without current pathological fracture: Secondary | ICD-10-CM

## 2022-08-22 MED ORDER — ROMOSOZUMAB-AQQG 105 MG/1.17ML ~~LOC~~ SOSY
210.0000 mg | PREFILLED_SYRINGE | Freq: Once | SUBCUTANEOUS | Status: AC
Start: 2022-08-22 — End: 2022-08-22
  Administered 2022-08-22: 210 mg via SUBCUTANEOUS

## 2022-08-22 NOTE — Progress Notes (Signed)
Patient presented for Evenity injection to left arm and to the right arm, patient voiced no concerns nor showed any signs of distress during injection . Pt also agrees to [place] sending rx to pharmacy and she pick it up and bring with her to the office on her future visits

## 2022-08-23 ENCOUNTER — Encounter: Payer: Self-pay | Admitting: *Deleted

## 2022-08-23 NOTE — Telephone Encounter (Addendum)
Pt received Evenity on 08/22/22. Per pharmacy team, Sheilah Mins is covered under her medical plan, so we will continue to do buy & bill.   I will only order prior to patient appt. (No need to send to pharmacy). I will notify patient via mychart.  Pharmacy team response:  Pharmacy benefit: Copay $covered under medical benefit If patient wants fill through the pharmacy benefit please send prescription to: , and include estimated need by date in rx notes. Pharmacy will ship medication directly to the office.   Patient may be eligible for Evenity Copay Card. Copay Card can make patient's cost as little as $25. Link to apply: https://www.amgensupportplus.com/copay   ** This summary of benefits is an estimation of the patient's out-of-pocket cost. Exact cost may very based on individual plan coverage.     Placed a call to [employer] at [phone] to obtain benefits. Call reference [phone]

## 2022-08-29 ENCOUNTER — Ambulatory Visit (INDEPENDENT_AMBULATORY_CARE_PROVIDER_SITE_OTHER): Payer: Managed Care, Other (non HMO) | Admitting: Dermatology

## 2022-08-29 DIAGNOSIS — L988 Other specified disorders of the skin and subcutaneous tissue: Secondary | ICD-10-CM

## 2022-08-29 NOTE — Patient Instructions (Signed)
Due to recent changes in healthcare laws, you may see results of your pathology and/or laboratory studies on MyChart before the doctors have had a chance to review them. We understand that in some cases there may be results that are confusing or concerning to you. Please understand that not all results are received at the same time and often the doctors may need to interpret multiple results in order to provide you with the best plan of care or course of treatment. Therefore, we ask that you please give us 2 business days to thoroughly review all your results before contacting the office for clarification. Should we see a critical lab result, you will be contacted sooner.   If You Need Anything After Your Visit  If you have any questions or concerns for your doctor, please call our main line at [phone] and press option 4 to reach your doctor's medical assistant. If no one answers, please leave a voicemail as directed and we will return your call as soon as possible. Messages left after 4 pm will be answered the following business day.   You may also send us a message via MyChart. We typically respond to MyChart messages within 1-2 business days.  For prescription refills, please ask your pharmacy to contact our office. Our fax number is [phone].  If you have an urgent issue when the clinic is closed that cannot wait until the next business day, you can page your doctor at the number below.    Please note that while we do our best to be available for urgent issues outside of office hours, we are not available 24/7.   If you have an urgent issue and are unable to reach us, you may choose to seek medical care at your doctor's office, retail clinic, urgent care center, or emergency room.  If you have a medical emergency, please immediately call 911 or go to the emergency department.  Pager Numbers  - Dr. Kowalski: [phone]  - Dr. Moye: [phone]  - Dr. Stewart:  [phone]  In the event of inclement weather, please call our main line at [phone] for an update on the status of any delays or closures.  Dermatology Medication Tips: Please keep the boxes that topical medications come in in order to help keep track of the instructions about where and how to use these. Pharmacies typically print the medication instructions only on the boxes and not directly on the medication tubes.   If your medication is too expensive, please contact our office at [phone] option 4 or send us a message through MyChart.   We are unable to tell what your co-pay for medications will be in advance as this is different depending on your insurance coverage. However, we may be able to find a substitute medication at lower cost or fill out paperwork to get insurance to cover a needed medication.   If a prior authorization is required to get your medication covered by your insurance company, please allow us 1-2 business days to complete this process.  Drug prices often vary depending on where the prescription is filled and some pharmacies may offer cheaper prices.  The website www.goodrx.com contains coupons for medications through different pharmacies. The prices here do not account for what the cost may be with help from insurance (it may be cheaper with your insurance), but the website can give you the price if you did not use any insurance.  - You can print the associated coupon and take it with   your prescription to the pharmacy.  - You may also stop by our office during regular business hours and pick up a GoodRx coupon card.  - If you need your prescription sent electronically to a different pharmacy, notify our office through [facility] MyChart or by phone at [phone] option 4.     

## 2022-08-29 NOTE — Progress Notes (Signed)
   Follow-Up Visit   Subjective  Adrienne Robinson is a 67 y.o. female who presents for the following: Botox   The following portions of the chart were reviewed this encounter and updated as appropriate: medications, allergies, medical history  Review of Systems:  No other skin or systemic complaints except as noted in HPI or Assessment and Plan.  Objective  Well appearing patient in no apparent distress; mood and affect are within normal limits.   A focused examination was performed of the following areas: face  Relevant exam findings are noted in the Assessment and Plan.  upper and lower lip Rhytides and volume loss.              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  Informed consent was obtained.  Preparation: The area was cleansed with alcohol.  Procedure Details:  Botox was injected into the dermis with a 30-gauge needle. Pressure applied to any bleeding. Ice packs offered for swelling.  Lot Number:  [id number] Expiration:  07/2024  Total Units Injected:  10  Plan: Patient was instructed to remain upright for 4 hours. Patient was instructed to avoid massaging the face and avoid vigorous exercise for the rest of the day. Tylenol may be used for headache.  Allow 2 weeks before returning to clinic for additional dosing as needed. Patient will call for any problems.     Return for AK follow up, ISK follow up, as scheduled.  Anise Salvo, RMA, am acting as scribe for Willeen Niece, MD .   Documentation: I have reviewed the above  documentation for accuracy and completeness, and I agree with the above.  Willeen Niece, MD

## 2022-09-11 ENCOUNTER — Telehealth: Payer: Self-pay

## 2022-09-11 NOTE — Telephone Encounter (Signed)
Patient called she had botox around her lips July 9, she report her lips still a little numb, numbness seems better as each day goes by, she just wanted to make sure this is normal

## 2022-09-18 ENCOUNTER — Encounter: Payer: Self-pay | Admitting: *Deleted

## 2022-09-19 ENCOUNTER — Ambulatory Visit: Payer: Managed Care, Other (non HMO)

## 2022-09-20 ENCOUNTER — Other Ambulatory Visit: Payer: Self-pay | Admitting: Internal Medicine

## 2022-09-20 NOTE — Discharge Instructions (Signed)

## 2022-09-21 ENCOUNTER — Ambulatory Visit
Admission: RE | Admit: 2022-09-21 | Discharge: 2022-09-21 | Discharge status: Home / Self Care | Payer: Managed Care, Other (non HMO) | Source: Ambulatory Visit | Attending: Family Medicine | Admitting: Family Medicine

## 2022-09-21 DIAGNOSIS — M5416 Radiculopathy, lumbar region: Secondary | ICD-10-CM

## 2022-09-21 MED ORDER — METHYLPREDNISOLONE ACETATE 40 MG/ML INJ SUSP (RADIOLOG
80.0000 mg | Freq: Once | INTRAMUSCULAR | Status: AC
  Administered 2022-09-21: 80 mg via EPIDURAL

## 2022-09-21 MED ORDER — IOPAMIDOL (ISOVUE-M 200) INJECTION 41%
1.0000 mL | Freq: Once | INTRAMUSCULAR | Status: AC
  Administered 2022-09-21: 1 mL via EPIDURAL

## 2022-09-22 ENCOUNTER — Other Ambulatory Visit: Payer: Self-pay | Admitting: Internal Medicine

## 2022-09-26 ENCOUNTER — Ambulatory Visit: Payer: Managed Care, Other (non HMO) | Admitting: Dermatology

## 2022-09-26 ENCOUNTER — Encounter: Payer: Self-pay | Admitting: Dermatology

## 2022-09-26 VITALS — BP 126/74 | HR 59

## 2022-09-26 DIAGNOSIS — Z7189 Other specified counseling: Secondary | ICD-10-CM

## 2022-09-26 DIAGNOSIS — W908XXA Exposure to other nonionizing radiation, initial encounter: Secondary | ICD-10-CM

## 2022-09-26 DIAGNOSIS — L578 Other skin changes due to chronic exposure to nonionizing radiation: Secondary | ICD-10-CM | POA: Diagnosis not present

## 2022-09-26 DIAGNOSIS — Z5111 Encounter for antineoplastic chemotherapy: Secondary | ICD-10-CM

## 2022-09-26 DIAGNOSIS — L570 Actinic keratosis: Secondary | ICD-10-CM | POA: Diagnosis not present

## 2022-09-26 DIAGNOSIS — L820 Inflamed seborrheic keratosis: Secondary | ICD-10-CM | POA: Diagnosis not present

## 2022-09-26 DIAGNOSIS — L988 Other specified disorders of the skin and subcutaneous tissue: Secondary | ICD-10-CM

## 2022-09-26 DIAGNOSIS — L565 Disseminated superficial actinic porokeratosis (DSAP): Secondary | ICD-10-CM

## 2022-09-26 NOTE — Patient Instructions (Addendum)
Cryotherapy Aftercare  Wash gently with soap and water everyday.   Apply Vaseline and Band-Aid daily until healed.   Treatment Plan Fluorouricil/calcipitrone cream to front of legs twice a day for 7-14 days  5-fluorouracil/calcipotriene cream is is a type of field treatment used to treat precancers, thin skin cancers, and areas of sun damage. Expected reaction includes irritation and mild inflammation potentially progressing to more severe inflammation including redness, scaling, crusting and open sores/erosions.  If too much irritation occurs, ensure application of only a thin layer and decrease frequency of use to achieve a tolerable level of inflammation. Recommend applying Vaseline ointment to open sores as needed.  Minimize sun exposure while under treatment. Recommend daily broad spectrum sunscreen SPF 30+ to sun-exposed areas, reapply every 2 hours as needed.   Due to recent changes in healthcare laws, you may see results of your pathology and/or laboratory studies on MyChart before the doctors have had a chance to review them. We understand that in some cases there may be results that are confusing or concerning to you. Please understand that not all results are received at the same time and often the doctors may need to interpret multiple results in order to provide you with the best plan of care or course of treatment. Therefore, we ask that you please give [place] 2 business days to thoroughly review all your results before contacting the office for clarification. Should we see a critical lab result, you will be contacted sooner.   If You Need Anything After Your Visit  If you have any questions or concerns for your doctor, please call our main line at [phone] If no one answers, please leave a voicemail as directed and we will return your call as soon as possible. Messages left after 4 pm will be answered the following business day.   You may also send [place] a message via MyChart. We  typically respond to MyChart messages within 1-2 business days.  For prescription refills, please ask your pharmacy to contact our office. Our fax number is [phone].  If you have an urgent issue when the clinic is closed that cannot wait until the next business day, you can page your doctor at the number below.    Please note that while we do our best to be available for urgent issues outside of office hours, we are not available 24/7.   If you have an urgent issue and are unable to reach [place], you may choose to seek medical care at your doctor's office, retail clinic, urgent care center, or emergency room.  If you have a medical emergency, please immediately call 911 or go to the emergency department. In the event of inclement weather, please call our main line at [phone] for an update on the status of any delays or closures.  Dermatology Medication Tips: Please keep the boxes that topical medications come in in order to help keep track of the instructions about where and how to use these. Pharmacies typically print the medication instructions only on the boxes and not directly on the medication tubes.   If your medication is too expensive, please contact our office at [phone] or send [place] a message through MyChart.   We are unable to tell what your co-pay for medications will be in advance as this is different depending on your insurance coverage. However, we may be able to find a substitute medication at lower cost or fill out paperwork to get insurance to cover a needed medication.  If a prior authorization is required to get your medication covered by your insurance company, please allow [place] 1-2 business days to complete this process.  Drug prices often vary depending on where the prescription is filled and some pharmacies may offer cheaper prices.  The website www.goodrx.com contains coupons for medications through different pharmacies. The prices here do not account for what  the cost may be with help from insurance (it may be cheaper with your insurance), but the website can give you the price if you did not use any insurance.  - You can print the associated coupon and take it with your prescription to the pharmacy.  - You may also stop by our office during regular business hours and pick up a GoodRx coupon card.  - If you need your prescription sent electronically to a different pharmacy, notify our office through [facility] or by phone at [phone]

## 2022-09-26 NOTE — Progress Notes (Signed)
Follow-Up Visit   Subjective  Adrienne Robinson is a 67 y.o. female who presents for the following: recheck Aks and irritated Sks that were frozen 06/27/22 Pt had Botox to her lips around 2 weeks ago. She doesn't feel like there is any difference.   The patient has spots, moles and lesions to be evaluated, some may be new or changing and the patient may have concern these could be cancer.   The following portions of the chart were reviewed this encounter and updated as appropriate: medications, allergies, medical history  Review of Systems:  No other skin or systemic complaints except as noted in HPI or Assessment and Plan.  Objective  Well appearing patient in no apparent distress; mood and affect are within normal limits.   A focused examination was performed of the following areas: Arms, legs, trunk,scalp  Relevant exam findings are noted in the Assessment and Plan.  Left Forearm x1, vertex scalp x1,left medial lower leg x1, right medial lower leg x3, left axilla x1 (7) Scaly, waxy, tan-brown macules/papules with erythema  Left upper pretibial region x7, right knee x1, right forearm x1, left chest x 2 (11) Pink keratotic macules    Assessment & Plan     Inflamed seborrheic keratosis (7) Left Forearm x1, vertex scalp x1,left medial lower leg x1, right medial lower leg x3, left axilla x1  Symptomatic, irritating, patient would like treated.   Destruction of lesion - Left Forearm x1, vertex scalp x1,left medial lower leg x1, right medial lower leg x3, left axilla x1 (7)  Destruction method: cryotherapy   Informed consent: discussed and consent obtained   Lesion destroyed using liquid nitrogen: Yes   Region frozen until ice ball extended beyond lesion: Yes   Outcome: patient tolerated procedure well with no complications   Post-procedure details: wound care instructions given   Additional details:  Prior to procedure, discussed risks of blister formation, small wound,  skin dyspigmentation, or rare scar following cryotherapy. Recommend Vaseline ointment to treated areas while healing.   AK (actinic keratosis) (11) Left upper pretibial region x7, right knee x1, right forearm x1, left chest x 2  Actinic keratoses are precancerous spots that appear secondary to cumulative UV radiation exposure/sun exposure over time. They are chronic with expected duration over 1 year. A portion of actinic keratoses will progress to squamous cell carcinoma of the skin. It is not possible to reliably predict which spots will progress to skin cancer and so treatment is recommended to prevent development of skin cancer.  Recommend daily broad spectrum sunscreen SPF 30+ to sun-exposed areas, reapply every 2 hours as needed.  Recommend staying in the shade or wearing long sleeves, sun glasses (UVA+UVB protection) and wide brim hats (4-inch brim around the entire circumference of the hat). Call for new or changing lesions.  Destruction of lesion - Left upper pretibial region x7, right knee x1, right forearm x1, left chest x 2 (11)  Destruction method: cryotherapy   Informed consent: discussed and consent obtained   Lesion destroyed using liquid nitrogen: Yes   Region frozen until ice ball extended beyond lesion: Yes   Outcome: patient tolerated procedure well with no complications   Post-procedure details: wound care instructions given   Additional details:  Prior to procedure, discussed risks of blister formation, small wound, skin dyspigmentation, or rare scar following cryotherapy. Recommend Vaseline ointment to treated areas while healing.   ACTINIC DAMAGE WITH PRECANCEROUS ACTINIC KERATOSES Counseling for Topical Chemotherapy Management: Patient exhibits: - Severe,  confluent actinic changes with pre-cancerous actinic keratoses that is secondary to cumulative UV radiation exposure over time - Condition that is severe; chronic, not at goal. - diffuse scaly erythematous macules  and papules with underlying dyspigmentation - Discussed Prescription "Field Treatment" topical Chemotherapy for Severe, Chronic Confluent Actinic Changes with Pre-Cancerous Actinic Keratoses Field treatment involves treatment of an entire area of skin that has confluent Actinic Changes (Sun/ Ultraviolet light damage) and PreCancerous Actinic Keratoses by method of PhotoDynamic Therapy (PDT) and/or prescription Topical Chemotherapy agents such as 5-fluorouracil, 5-fluorouracil/calcipotriene, and/or imiquimod.  The purpose is to decrease the number of clinically evident and subclinical PreCancerous lesions to prevent progression to development of skin cancer by chemically destroying early precancer changes that may or may not be visible.  It has been shown to reduce the risk of developing skin cancer in the treated area. As a result of treatment, redness, scaling, crusting, and open sores may occur during treatment course. One or more than one of these methods may be used and may have to be used several times to control, suppress and eliminate the PreCancerous changes. Discussed treatment course, expected reaction, and possible side effects. - Recommend daily broad spectrum sunscreen SPF 30+ to sun-exposed areas, reapply every 2 hours as needed.  - Staying in the shade or wearing long sleeves, sun glasses (UVA+UVB protection) and wide brim hats (4-inch brim around the entire circumference of the hat) are also recommended. - Call for new or changing lesions.  - 5-fluorouracil/calcipotriene to front of legs twice a day for 7-14 days, Rx sent to [facility]  5-fluorouracil/calcipotriene cream is is a type of field treatment used to treat precancers, thin skin cancers, and areas of sun damage. Expected reaction includes irritation and mild inflammation potentially progressing to more severe inflammation including redness, scaling, crusting and open sores/erosions.  If too much irritation occurs, ensure application of only  a thin layer and decrease frequency of use to achieve a tolerable level of inflammation. Recommend applying Vaseline ointment to open sores as needed.  Minimize sun exposure while under treatment. Recommend daily broad spectrum sunscreen SPF 30+ to sun-exposed areas, reapply every 2 hours as needed.    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 Condition is symptomatic / bothersome to patient. Not to goal.   DSAP is a chronic inherited condition of sun-exposed skin, most commonly affecting the arms and legs.  It is difficult to treat.  Recommend photoprotection and regular use of spf 30 or higher sunscreen to prevent worsening of condition and precancerous changes.   Continue Cholesterol 2% Lovastatin 2% Cream 240 gm- Apply twice daily as directed to affected areas arms and legs.  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 Will also do 1 syringe of Restyline Refyne to lips and perioral rhytids at $650 charge Pt will schedule .  Return in about 6 months (around 03/29/2023) for TBSE,filler and botox.  I, Tillie Fantasia, CMA, am acting as scribe for Willeen Niece, MD.   Documentation: I have reviewed the above documentation for accuracy and completeness, and I agree with the above.  Willeen Niece, MD

## 2022-10-03 ENCOUNTER — Encounter: Payer: Self-pay | Admitting: Internal Medicine

## 2022-10-03 ENCOUNTER — Telehealth: Payer: Self-pay | Admitting: Internal Medicine

## 2022-10-03 MED ORDER — HYDROCODONE-ACETAMINOPHEN 5-325 MG PO TABS: 2.0000 | ORAL_TABLET | Freq: Four times a day (QID) | ORAL | 0 refills | Status: DC | PRN

## 2022-10-03 NOTE — Telephone Encounter (Signed)
Hydrocone refilled for one month

## 2022-10-03 NOTE — Telephone Encounter (Signed)
Requesting: Hydrocodone Contract: No UDS: No Last Visit: 05/23/2022 Next Visit: 10/09/2022 Last Refill: 06/27/2022  Please Advise

## 2022-10-03 NOTE — Telephone Encounter (Signed)
Duplicate message. 

## 2022-10-03 NOTE — Telephone Encounter (Signed)
MyChart message:  Ellyn Hack .Marland Kitchen   Will you please write me a prescription for my hyrocodone? I've been to see Ayesha Mohair and I had an epidural last week. I have an virtual appoint with you on Monday.  I forward to seeing you.   Thanks!  Babs Sciara

## 2022-10-03 NOTE — Addendum Note (Signed)
Addended by: Sherlene Shams on: 10/03/2022 09:06 AM   Modules accepted: Orders

## 2022-10-04 MED ORDER — HYDROCODONE-ACETAMINOPHEN 10-325 MG PO TABS: 1.0000 | ORAL_TABLET | Freq: Four times a day (QID) | ORAL | 0 refills | Status: DC | PRN

## 2022-10-04 NOTE — Telephone Encounter (Signed)
Pt called in to let Dr. Darrick Huntsman knows that med HYDROcodone-acetaminophen (NORCO/VICODIN) 5-325 MG tablet its out of stock at her [facility], however, pt stated that they have the 10mg  or 7.5mg  available if she can sent in that rx over?  Prescription Request  10/04/2022  LOV: 05/23/2022  What is the name of the medication or equipment? HYDROcodone-acetaminophen (NORCO/VICODIN) 5-325 MG tablet  Have you contacted your pharmacy to request a refill? Yes   Which pharmacy would you like this sent to?   [id number], [place] - [address] [place] [address] Phone: [phone] Fax: [phone]   Patient notified that their request is being sent to the clinical staff for review and that they should receive a response within 2 business days.   Please advise at Mobile [phone] (mobile)

## 2022-10-09 ENCOUNTER — Other Ambulatory Visit: Payer: Managed Care, Other (non HMO)

## 2022-10-09 ENCOUNTER — Ambulatory Visit: Payer: Managed Care, Other (non HMO) | Admitting: Oncology

## 2022-10-09 ENCOUNTER — Telehealth (INDEPENDENT_AMBULATORY_CARE_PROVIDER_SITE_OTHER): Payer: Managed Care, Other (non HMO) | Admitting: Internal Medicine

## 2022-10-09 ENCOUNTER — Encounter: Payer: Self-pay | Admitting: Internal Medicine

## 2022-10-09 VITALS — BP 110/73 | HR 65 | Ht 63.0 in | Wt 113.7 lb

## 2022-10-09 DIAGNOSIS — M48061 Spinal stenosis, lumbar region without neurogenic claudication: Secondary | ICD-10-CM

## 2022-10-09 DIAGNOSIS — M810 Age-related osteoporosis without current pathological fracture: Secondary | ICD-10-CM

## 2022-10-09 DIAGNOSIS — I10 Essential (primary) hypertension: Secondary | ICD-10-CM | POA: Diagnosis not present

## 2022-10-09 DIAGNOSIS — E538 Deficiency of other specified B group vitamins: Secondary | ICD-10-CM

## 2022-10-09 DIAGNOSIS — D649 Anemia, unspecified: Secondary | ICD-10-CM | POA: Diagnosis not present

## 2022-10-09 MED ORDER — AMLODIPINE BESYLATE 10 MG PO TABS: 10.0000 mg | ORAL_TABLET | Freq: Every day | ORAL | 3 refills | Status: DC

## 2022-10-09 MED ORDER — METOPROLOL SUCCINATE ER 25 MG PO TB24: 25.0000 mg | ORAL_TABLET | Freq: Every day | ORAL | 1 refills | Status: DC

## 2022-10-09 NOTE — Patient Instructions (Addendum)
Start 2000 Ius of vitamin D3 daily  Your annual mammogram has been ordered AND was DUE in LATE JULY.  [facility] will not allow [place] to schedule it for you,  so please  call to make your appointment [phone]  We will do labs at your appointment in November

## 2022-10-09 NOTE — Telephone Encounter (Signed)
Error

## 2022-10-09 NOTE — Assessment & Plan Note (Signed)
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 Patient felt better on weekly injections rather than monthly.  continue weekly injections

## 2022-10-09 NOTE — Assessment & Plan Note (Signed)
S/P ESI  last one 8 months ago by Dr Mosetta Putt at [facility].  Her chronic pain is Managed with a standing desk for work mode and opioids for non operable pain.  She has not had any ER visits for pain management and has not requested any early refills.  Her Refill history was confirmed via [place] Controlled Substance database by me today during her visit and there have been no prescriptions of controlled substances filled from any providers other than me. Refilling for 3 months

## 2022-10-09 NOTE — Assessment & Plan Note (Signed)
Etiology unclear. Now seeing hematology.   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

## 2022-10-09 NOTE — Assessment & Plan Note (Signed)
Well controlled on amlodipine 10 mg daily  by home readings . Marland Kitchen Renal function and electrolytes have normalized,  no changes today.  Lab Results  Component Value Date   CREATININE 0.74 06/09/2022   Lab Results  Component Value Date   NA 138 06/09/2022   K 3.6 06/09/2022   CL 106 06/09/2022   CO2 24 06/09/2022

## 2022-10-09 NOTE — Progress Notes (Signed)
Virtual Visit via Caregility   Note   This format is felt to be most appropriate for this patient at this time.  All issues noted in this document were discussed and addressed.  No physical exam was performed (except for noted visual exam findings with Video Visits).   I connected with Mega  on 10/09/22 at  8:30 AM EDT by a video enabled telemedicine application  and verified that I am speaking with the correct person using two identifiers. Location patient: home Location provider: work or home office Persons participating in the virtual visit: patient, provider  I discussed the limitations, risks, security and privacy concerns of performing an evaluation and management service by telephone and the availability of in person appointments. I also discussed with the patient that there may be a patient responsible charge related to this service. The patient expressed understanding and agreed to proceed.   Reason for visit:  chronic pain management,  HTN management   HPI:  1) Chronic pain/Spinal stenosis:  last lumbar ESI was 8 months ago by Mosetta Putt at [facility] . Using hydrocodone 10/325 mg 4 times daily to manage diffuse degenerative arthritis   2) HTN:  Patient is taking amlodipine 10 mg  as prescribed and notes no adverse effects.  Home BP readings have been done about once per week and are  generally < 130/80 .  She is avoiding added salt in her diet and walking regularly about 3 times per week for exercise  .   3) Osteoporosis:  not taking D3. Last level 38 in Oct 2023.Sheilah Mins Started May 4,  July 2 was next dose and August 20 Isnext scheudled   ROS: See pertinent positives and negatives per HPI.  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   Prepatellar effusion of right knee 06/09/2019   Aspiration done June 13, 2019   Scoliosis    Spinal stenosis of lumbar region    Squamous cell carcinoma of skin 06/24/2018   R lateral calf   Tubular adenoma of colon     Past Surgical History:  Procedure Laterality Date   ABDOMINAL HYSTERECTOMY     APPENDECTOMY     CARDIAC CATHETERIZATION     normal coronaries, no wall motion abnormalities 11/02/09 [facility])   CATARACT EXTRACTION W/PHACO Left 11/24/2020   Procedure: CATARACT EXTRACTION PHACO AND INTRAOCULAR LENS PLACEMENT (IOC) LEFT 6.09 00:46.7;  Surgeon: Lockie Mola, MD;  Location: [facility];  Service: Ophthalmology;  Laterality: Left;  patient wants early   CATARACT EXTRACTION W/PHACO Right 12/08/2020   Procedure: CATARACT EXTRACTION PHACO AND INTRAOCULAR LENS PLACEMENT (IOC) RIGHT 3.92 00:49.0;  Surgeon: Lockie Mola, MD;  Location: [facility];  Service: Ophthalmology;  Laterality: Right;  patient request early   NASAL SINUS SURGERY     OOPHORECTOMY     RADIOLOGY WITH ANESTHESIA  02/26/2012  Procedure: RADIOLOGY WITH ANESTHESIA;  Surgeon: Oneal Grout, MD;  Location: [facility] OR;  Service: Radiology;  Laterality: N/A;   REVERSE SHOULDER ARTHROPLASTY Right 06/27/2013   Procedure: REVERSE RIGHT TOTAL SHOULDER ARTHROPLASTY;  Surgeon: Verlee Rossetti, MD;  Location[facility] OR;  Service: Orthopedics;  Laterality: Right;   SHOULDER ARTHROSCOPY WITH ROTATOR CUFF REPAIR AND SUBACROMIAL DECOMPRESSION  2007   left   TONSILLECTOMY  adnoids    Family History  Problem Relation Age of Onset   Thyroid disease Mother    Colon cancer Father 39   Diabetes Father    Heart  disease Father    Rectal cancer Father    Breast cancer Paternal Grandmother    Colon cancer Paternal Grandfather    Colon cancer Other        pggm   Stomach cancer Neg Hx    Esophageal cancer Neg Hx     SOCIAL HX: married.  . reports that she has never smoked. She has never used smokeless tobacco. She reports current alcohol use of about 2.0 standard drinks of alcohol per week. She reports that she does not use drugs.    Current Outpatient Medications:    buPROPion (WELLBUTRIN XL) 300 MG 24 hr tablet, Take 1 tablet (300 mg total) by mouth daily., Disp: 90 tablet, Rfl: 1   cyanocobalamin (,VITAMIN B-12,) 1000 MCG/ML injection, INJECT 1 ML (1,000 MCG TOTAL) INTO THE MUSCLE ONCE A WEEK. (Patient taking differently: Inject 1,000 mcg into the muscle every 30 (thirty) days.), Disp: 12 mL, Rfl: 6   esomeprazole (NEXIUM) 20 MG capsule, TAKE 1 CAPSULE (20 MG TOTAL) BY MOUTH DAILY. **PLEASE CONTACT OFFICE TO SCHEDULE FOLLOW UP, Disp: 90 capsule, Rfl: 0   furosemide (LASIX) 20 MG tablet, TAKE 1 TABLET (20 MG TOTAL) BY MOUTH DAILY WITH BREAKFAST AS NEEDED FOR EDEMA, Disp: 90 tablet, Rfl: 1   HYDROcodone-acetaminophen (NORCO) 10-325 MG tablet, Take 1 tablet by mouth every 6 (six) hours as needed for severe pain., Disp: 120 tablet, Rfl: 0   Iron-Vitamin C 65-125 MG TABS, Take 1 tablet by mouth daily., Disp: 90 tablet, Rfl: 1   LINZESS 145 MCG CAPS capsule, TAKE 1 CAPSULE BY MOUTH EVERY DAY BEFORE BREAKFAST, Disp: 90 capsule, Rfl: 0   Multiple Vitamin (MULTIVITAMIN) tablet, Take 1 tablet by mouth daily., Disp: , Rfl:    propranolol (INDERAL) 10 MG tablet, TAKE 1/2 TABLET BY MOUTH AS NEEDED FOR ANXIETY, Disp: 15 tablet, Rfl: 1   sodium chloride 1 g tablet, Take 1 tablet (1 g total) by mouth 2 (two) times daily with a meal., Disp: 60 tablet, Rfl: 2   Syringe/Needle, Disp, (SYRINGE 3CC/25GX1") 25G X 1" 3 ML MISC, Use for b12 injections, Disp: 50 each, Rfl: 0   zolpidem (AMBIEN CR) 12.5 MG CR tablet, Take  12.5 mg by mouth at bedtime as needed., Disp: , Rfl:    amLODipine (NORVASC) 10 MG tablet, Take 1 tablet (10 mg total) by mouth daily., Disp: 90 tablet, Rfl: 3   famciclovir (FAMVIR) 500 MG tablet, Take 1 tablet (500 mg total) by mouth 3 (three) times daily. For one week (Patient not taking: Reported on 10/09/2022), Disp: 21 tablet, Rfl: 2   metoprolol succinate (TOPROL-XL) 25 MG 24 hr tablet, Take 1 tablet (25 mg total) by mouth daily., Disp: 90 tablet, Rfl: 1  EXAM:  VITALS per patient if applicable:  GENERAL: alert, oriented, appears well and in no acute distress  HEENT: atraumatic, conjunttiva clear, no obvious abnormalities on inspection  of external nose and ears  NECK: normal movements of the head and neck  LUNGS: on inspection no signs of respiratory distress, breathing rate appears normal, no obvious gross SOB, gasping or wheezing  CV: no obvious cyanosis  MS: moves all visible extremities without noticeable abnormality  PSYCH/NEURO: pleasant and cooperative, no obvious depression or anxiety, speech and thought processing grossly intact  ASSESSMENT AND PLAN: Spinal stenosis of lumbar region without neurogenic claudication Assessment & Plan: S/P ESI  last one 8 months ago by Dr Mosetta Putt at [facility].  Her chronic pain is Managed with a standing desk for work mode and opioids for non operable pain.  She has not had any ER visits for pain management and has not requested any early refills.  Her Refill history was confirmed via [place] Controlled Substance database by me today during her visit and there have been no prescriptions of controlled substances filled from any providers other than me. Refilling for 3 months    Osteoporosis without current pathological fracture, unspecified osteoporosis type Assessment & Plan: Most recent T score -3.3  Discussed treatment options,  Calcium and Vit d requirements. She is receiving  her 3rd monthly dose of  Evenity tomorrow.  Will repeat DEXA in May  2025   Normocytic anemia Assessment & Plan: Etiology unclear. Now seeing hematology.   Ruling out MM, low EPO given mild CKD  Lab Results  Component Value Date   WBC 7.8 06/09/2022   HGB 10.3 (L) 06/09/2022   HCT 30.2 (L) 06/09/2022   MCV 100.0 06/09/2022   PLT 303 06/09/2022   Lab Results  Component Value Date   IRON 140 06/09/2022   TIBC 372 06/09/2022   FERRITIN 74 06/09/2022   Lab Results  Component Value Date   TSH 0.979 01/16/2022   Last vitamin B12 and Folate Lab Results  Component Value Date   VITAMINB12 785 06/09/2022   FOLATE 18.8 06/09/2022     Primary hypertension Assessment & Plan: Well controlled on amlodipine 10 mg daily  by home readings . Marland Kitchen Renal function and electrolytes have normalized,  no changes today.  Lab Results  Component Value Date   CREATININE 0.74 06/09/2022   Lab Results  Component Value Date   NA 138 06/09/2022   K 3.6 06/09/2022   CL 106 06/09/2022   CO2 24 06/09/2022      B12 deficiency Assessment & Plan: Likely secondary to reactive gastropathy by EGD . Patient felt better on weekly injections rather than monthly.  continue weekly injections    Other orders -     amLODIPine Besylate; Take 1 tablet (10 mg total) by mouth daily.  Dispense: 90 tablet; Refill: 3 -     Metoprolol Succinate ER; Take 1 tablet (25 mg total) by mouth daily.  Dispense: 90 tablet; Refill: 1      I discussed the assessment and treatment plan with the patient. The patient was provided an opportunity to ask questions and all were answered. The patient agreed with the plan and demonstrated an understanding of the instructions.   The patient was advised to call back or seek an in-person evaluation if the symptoms worsen or if the condition fails to improve as anticipated.   I spent 30 minutes dedicated to the care of this patient on the date of this encounter to include pre-visit review of his medical history,  Face-to-face time with the patient ,  and post visit ordering of testing and therapeutics.    Sherlene Shams,  MD

## 2022-10-09 NOTE — Assessment & Plan Note (Signed)
Most recent T score -3.3  Discussed treatment options,  Calcium and Vit d requirements. She is receiving  her 3rd monthly dose of  Evenity tomorrow.  Will repeat DEXA in May 2025

## 2022-10-10 ENCOUNTER — Ambulatory Visit: Payer: Managed Care, Other (non HMO)

## 2022-10-11 NOTE — Progress Notes (Deleted)
Adrienne Robinson [address] [place] [address] Phone: [phone] Subjective:    I'm seeing this patient by the request  of:  Adrienne Shams, MD  CC: Left thumb pain follow-up  [id number]  08/17/2022 Chronic, with worsening symptoms. Still trying to avoid surgical intervention. Is nearly bone-on-bone. He does respond well to the injection. States that she gets approximately 2 months of improvement. Hopeful that patient will get some relief. Encouraged still bracing at night  Worsening symptoms again.  Discussed icing regimen and home exercises, which activities to do and which ones to avoid.  Increase activity slowly.  Follow-up again in 6 to 8 weeks    Chronic problem with worsening symptoms. Significant arthritic changes in multiple different areas.  Joint just like this symptomatically She can continue to be as active as possible.  Warned of potential side effects.  Patient should do well with this.  Increase activity as well.  Discussed scapular strengthening.  Follow-up again in 6 to 8 weeks  Updated 10/12/2022 Adrienne Robinson is a 67 y.o. female coming in with complaint of thumb and shoulder pain  Recently patient did have a video visit with primary care provider.  Is getting hydrocodone in the last epidural was August partial 2024.    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   Prepatellar effusion of  right knee 06/09/2019   Aspiration done June 13, 2019   Scoliosis    Spinal stenosis of lumbar region    Squamous cell carcinoma of skin 06/24/2018   R lateral calf   Tubular adenoma of colon    Past Surgical History:  Procedure Laterality Date   ABDOMINAL HYSTERECTOMY     APPENDECTOMY     CARDIAC CATHETERIZATION     normal coronaries, no wall motion abnormalities 11/02/09 [facility])   CATARACT EXTRACTION W/PHACO Left 11/24/2020   Procedure: CATARACT EXTRACTION PHACO AND INTRAOCULAR LENS PLACEMENT (IOC) LEFT 6.09 00:46.7;  Surgeon: Lockie Mola, MD;  Location: [facility];  Service: Ophthalmology;  Laterality: Left;  patient wants early   CATARACT EXTRACTION W/PHACO Right 12/08/2020   Procedure: CATARACT EXTRACTION PHACO AND INTRAOCULAR LENS PLACEMENT (IOC) RIGHT 3.92 00:49.0;  Surgeon: Lockie Mola, MD;  Location: [facility];  Service: Ophthalmology;  Laterality: Right;  patient request early   NASAL SINUS SURGERY     OOPHORECTOMY     RADIOLOGY WITH ANESTHESIA  02/26/2012   Procedure: RADIOLOGY WITH ANESTHESIA;  Surgeon: Oneal Grout, MD;  Location: [facility] OR;  Service: Radiology;  Laterality: N/A;   REVERSE SHOULDER ARTHROPLASTY Right 06/27/2013   Procedure: REVERSE RIGHT TOTAL SHOULDER ARTHROPLASTY;  Surgeon: Verlee Rossetti, MD;  Location: [facility] OR;  Service: Orthopedics;  Laterality: Right;   SHOULDER ARTHROSCOPY WITH ROTATOR CUFF REPAIR AND SUBACROMIAL DECOMPRESSION  2007   left   TONSILLECTOMY  adnoids  Social History   Socioeconomic History   Marital status: Married    Spouse name: Not on file   Number of children: 0   Years of education: 18   Highest education level: Not on file  Occupational History   Occupation: Nurse executive   Tobacco Use   Smoking status: Never   Smokeless tobacco: Never  Vaping Use   Vaping status: Never Used  Substance and Sexual Activity   Alcohol use: Yes    Alcohol/week: 2.0 standard drinks of alcohol     Types: 2 Glasses of wine per week    Comment: 2-3 week with dinner   Drug use: No   Sexual activity: Yes  Other Topics Concern   Not on file  Social History Narrative   Lives at home with her husband.   Right-handed.   2-3 diet cokes per day.   Social Determinants of Health   Financial Resource Strain: Not on file  Food Insecurity: Not on file  Transportation Needs: Not on file  Physical Activity: Not on file  Stress: Not on file  Social Connections: Unknown (07/03/2021)   Received from [facility]   Social Network    Social Network: Not on file   Allergies  Allergen Reactions   Buspirone Other (See Comments)    Dizziness,  dry mouth    Celebrex [Celecoxib] Swelling   Adhesive [Tape] Rash and Other (See Comments)    Redness, blisters, skin peeling off.   Morphine And Codeine Rash   Family History  Problem Relation Age of Onset   Thyroid disease Mother    Colon cancer Father 18   Diabetes Father    Heart disease Father    Rectal cancer Father    Breast cancer Paternal Grandmother    Colon cancer Paternal Grandfather    Colon cancer Other        pggm   Stomach cancer Neg Hx    Esophageal cancer Neg Hx      Current Outpatient Medications (Cardiovascular):    amLODipine (NORVASC) 10 MG tablet, Take 1 tablet (10 mg total) by mouth daily.   furosemide (LASIX) 20 MG tablet, TAKE 1 TABLET (20 MG TOTAL) BY MOUTH DAILY WITH BREAKFAST AS NEEDED FOR EDEMA   metoprolol succinate (TOPROL-XL) 25 MG 24 hr tablet, Take 1 tablet (25 mg total) by mouth daily.   propranolol (INDERAL) 10 MG tablet, TAKE 1/2 TABLET BY MOUTH AS NEEDED FOR ANXIETY   Current Outpatient Medications (Analgesics):    HYDROcodone-acetaminophen (NORCO) 10-325 MG tablet, Take 1 tablet by mouth every 6 (six) hours as needed for severe pain.  Current Outpatient Medications (Hematological):    cyanocobalamin (,VITAMIN B-12,) 1000 MCG/ML injection, INJECT 1 ML (1,000 MCG TOTAL) INTO THE MUSCLE ONCE A WEEK.  (Patient taking differently: Inject 1,000 mcg into the muscle every 30 (thirty) days.)   Iron-Vitamin C 65-125 MG TABS, Take 1 tablet by mouth daily.  Current Outpatient Medications (Other):    buPROPion (WELLBUTRIN XL) 300 MG 24 hr tablet, Take 1 tablet (300 mg total) by mouth daily.   esomeprazole (NEXIUM) 20 MG capsule, TAKE 1 CAPSULE (20 MG TOTAL) BY MOUTH DAILY. **PLEASE CONTACT OFFICE TO SCHEDULE FOLLOW UP   famciclovir (FAMVIR) 500 MG tablet, Take 1 tablet (500 mg total) by mouth 3 (three) times daily. For one week (Patient not taking: Reported on 10/09/2022)   LINZESS 145 MCG CAPS capsule, TAKE 1 CAPSULE BY MOUTH EVERY DAY BEFORE BREAKFAST   Multiple Vitamin (MULTIVITAMIN) tablet, Take 1  tablet by mouth daily.   sodium chloride 1 g tablet, Take 1 tablet (1 g total) by mouth 2 (two) times daily with a meal.   Syringe/Needle, Disp, (SYRINGE 3CC/25GX1") 25G X 1" 3 ML MISC, Use for b12 injections   zolpidem (AMBIEN CR) 12.5 MG CR tablet, Take 12.5 mg by mouth at bedtime as needed.   Reviewed prior external information including notes and imaging from  primary care provider As well as notes that were available from care everywhere and other healthcare systems.  Past medical history, social, surgical and family history all reviewed in electronic medical record.  No pertanent information unless stated regarding to the chief complaint.   Review of Systems:  No headache, visual changes, nausea, vomiting, diarrhea, constipation, dizziness, abdominal pain, skin rash, fevers, chills, night sweats, weight loss, swollen lymph nodes, body aches, joint swelling, chest pain, shortness of breath, mood changes. POSITIVE muscle aches  Objective  There were no vitals taken for this visit.   General: No apparent distress alert and oriented x3 mood and affect normal, dressed appropriately.  HEENT: Pupils equal, extraocular movements intact  Respiratory: Patient's speak in full sentences and does not  appear short of breath  Cardiovascular: No lower extremity edema, non tender, no erythema  Left thumb exam shows  Procedure: Real-time Ultrasound Guided Injection of  Device: GE Logiq Q7 Ultrasound guided injection is preferred based studies that show increased duration, increased effect, greater accuracy, decreased procedural pain, increased response rate, and decreased cost with ultrasound guided versus blind injection.  Verbal informed consent obtained.  Time-out conducted.  Noted no overlying erythema, induration, or other signs of local infection.  Skin prepped in a sterile fashion.  Local anesthesia: Topical Ethyl chloride.  With sterile technique and under real time ultrasound guidance:   Completed without difficulty  Pain immediately resolved suggesting accurate placement of the medication.  Advised to call if fevers/chills, erythema, induration, drainage, or persistent bleeding.  Impression: Technically successful ultrasound guided injection.   Impression and Recommendations:     The above documentation has been reviewed and is accurate and complete Judi Saa, DO

## 2022-10-12 ENCOUNTER — Ambulatory Visit: Payer: Managed Care, Other (non HMO) | Admitting: Family Medicine

## 2022-10-13 ENCOUNTER — Inpatient Hospital Stay: Payer: Managed Care, Other (non HMO) | Admitting: Oncology

## 2022-10-13 ENCOUNTER — Inpatient Hospital Stay: Payer: Managed Care, Other (non HMO)

## 2022-10-17 ENCOUNTER — Ambulatory Visit: Payer: Managed Care, Other (non HMO)

## 2022-10-17 NOTE — Telephone Encounter (Signed)
Pt missed Evenity dose on 10/10/22. Her last injection was on 08/22/02. She will soon be a month overdue.  I constantly communicate with her and even reminded her of the appt on 8/20.  Pt called today (8/27) to cancel her "missed" appt on 8/20.  I know its important to stay consistent with this medication so I am routing to you for any assistance or advise on how to keep her on track or to see if it is necessary.   Below is the message received today when she cancelled her missed appt.    Rescheduled: Canceled: 10/10/2022 1:05 PM 10/17/2022 8:42 AM By: By: Ophelia Charter, KIMBERLY P Cancel Rsn: Patient (Patient called to state she has to cancel because her husband broke his wrist. Patient states she will call us back to reschedule.)

## 2022-10-18 ENCOUNTER — Ambulatory Visit: Payer: Managed Care, Other (non HMO) | Admitting: Family Medicine

## 2022-10-19 ENCOUNTER — Encounter: Payer: Self-pay | Admitting: Nurse Practitioner

## 2022-10-19 ENCOUNTER — Ambulatory Visit (INDEPENDENT_AMBULATORY_CARE_PROVIDER_SITE_OTHER): Payer: Managed Care, Other (non HMO) | Admitting: Nurse Practitioner

## 2022-10-19 VITALS — BP 122/68 | HR 64 | Temp 98.1°F | Ht 63.0 in | Wt 113.8 lb

## 2022-10-19 DIAGNOSIS — M810 Age-related osteoporosis without current pathological fracture: Secondary | ICD-10-CM | POA: Diagnosis not present

## 2022-10-19 DIAGNOSIS — S51812A Laceration without foreign body of left forearm, initial encounter: Secondary | ICD-10-CM | POA: Diagnosis not present

## 2022-10-19 MED ORDER — ROMOSOZUMAB-AQQG 105 MG/1.17ML ~~LOC~~ SOSY
210.0000 mg | PREFILLED_SYRINGE | Freq: Once | SUBCUTANEOUS | Status: AC
Start: 2022-10-19 — End: 2022-10-19
  Administered 2022-10-19: 210 mg via SUBCUTANEOUS

## 2022-10-19 NOTE — Progress Notes (Signed)
Sending this to the doc of the day (ARNETT) pt presented today for an office visit. Pt informed us that she missed her Nurse Visit appt to receive the Evenity subcutaneous injection.    Pt tolerated injections well in both the left and right arm.

## 2022-10-19 NOTE — Progress Notes (Signed)
Adrienne Dicker, NP-C Phone: [phone]  HALO CARDINALI is a 67 y.o. female who presents today for left arm wound.   Patient with left forearm wound. She was scratched by her small dog last Friday while playing. The dog was recently groomed and had sharp nails. She was using steri-strips on the wound, which she believes pulled off surrounding skin causing a larger wound area. She did note a small amount of drainage coming from it approximately 3 days ago. She has been using mupirocin ointment. She has noticed significant improvement in the wound since using the ointment over the last 3 days. She has been keeping the area clean and dry. Denies fevers/chills.   Social History   Tobacco Use  Smoking Status Never  Smokeless Tobacco Never    Current Outpatient Medications on File Prior to Visit  Medication Sig Dispense Refill   amLODipine (NORVASC) 10 MG tablet Take 1 tablet (10 mg total) by mouth daily. 90 tablet 3   buPROPion (WELLBUTRIN XL) 300 MG 24 hr tablet Take 1 tablet (300 mg total) by mouth daily. 90 tablet 1   cyanocobalamin (,VITAMIN B-12,) 1000 MCG/ML injection INJECT 1 ML (1,000 MCG TOTAL) INTO THE MUSCLE ONCE A WEEK. (Patient taking differently: Inject 1,000 mcg into the muscle every 30 (thirty) days.) 12 mL 6   esomeprazole (NEXIUM) 20 MG capsule TAKE 1 CAPSULE (20 MG TOTAL) BY MOUTH DAILY. **PLEASE CONTACT OFFICE TO SCHEDULE FOLLOW UP 90 capsule 0   famciclovir (FAMVIR) 500 MG tablet Take 1 tablet (500 mg total) by mouth 3 (three) times daily. For one week (Patient not taking: Reported on 10/09/2022) 21 tablet 2   furosemide (LASIX) 20 MG tablet TAKE 1 TABLET (20 MG TOTAL) BY MOUTH DAILY WITH BREAKFAST AS NEEDED FOR EDEMA 90 tablet 1   HYDROcodone-acetaminophen (NORCO) 10-325 MG tablet Take 1 tablet by mouth every 6 (six) hours as needed for severe pain. 120 tablet 0   Iron-Vitamin C 65-125 MG TABS Take 1 tablet by mouth daily. 90 tablet 1   LINZESS 145 MCG CAPS capsule TAKE 1  CAPSULE BY MOUTH EVERY DAY BEFORE BREAKFAST 90 capsule 0   metoprolol succinate (TOPROL-XL) 25 MG 24 hr tablet Take 1 tablet (25 mg total) by mouth daily. 90 tablet 1   Multiple Vitamin (MULTIVITAMIN) tablet Take 1 tablet by mouth daily.     propranolol (INDERAL) 10 MG tablet TAKE 1/2 TABLET BY MOUTH AS NEEDED FOR ANXIETY 15 tablet 1   sodium chloride 1 g tablet Take 1 tablet (1 g total) by mouth 2 (two) times daily with a meal. 60 tablet 2   Syringe/Needle, Disp, (SYRINGE 3CC/25GX1") 25G X 1" 3 ML MISC Use for b12 injections 50 each 0   zolpidem (AMBIEN CR) 12.5 MG CR tablet Take 12.5 mg by mouth at bedtime as needed.     No current facility-administered medications on file prior to visit.    ROS see history of present illness  Objective  Physical Exam Vitals:   10/19/22 1622  BP: 122/68  Pulse: 64  Temp: 98.1 F (36.7 C)  SpO2: 99%    BP Readings from Last 3 Encounters:  10/19/22 122/68  10/09/22 110/73  09/26/22 126/74   Wt Readings from Last 3 Encounters:  10/19/22 113 lb 12.8 oz (51.6 kg)  10/09/22 113 lb 11.2 oz (51.6 kg)  08/17/22 111 lb (50.3 kg)    Physical Exam Constitutional:      General: She is not in acute distress.  Appearance: Normal appearance.  HENT:     Head: Normocephalic.  Cardiovascular:     Rate and Rhythm: Normal rate and regular rhythm.     Heart sounds: Normal heart sounds.  Pulmonary:     Effort: Pulmonary effort is normal.     Breath sounds: Normal breath sounds.  Skin:    General: Skin is warm and dry.     Findings: Wound present.     Comments: Area healing well. Mild surrounding erythema. No warmth or drainage present. No tenderness.   Neurological:     General: No focal deficit present.     Mental Status: She is alert.  Psychiatric:        Mood and Affect: Mood normal.        Behavior: Behavior normal.      Assessment/Plan: Please see individual problem list.  Laceration of left forearm without complication, initial  encounter Assessment & Plan: Healing well, scabbed over. Mild erythema present. No warmth or drainage noted. Do not feel that oral antibiotics are necessary. She will continue using mupirocin ointment and keeping the area clean and dry. She will monitor for signs of infection. Return precautions given to patient.    Osteoporosis, unspecified osteoporosis type, unspecified pathological fracture presence -     [id number]    Return if symptoms worsen or fail to improve.   Adrienne Dicker, NP-C [facility] - [employer]

## 2022-10-25 ENCOUNTER — Encounter: Payer: Self-pay | Admitting: Nurse Practitioner

## 2022-10-25 DIAGNOSIS — S51812A Laceration without foreign body of left forearm, initial encounter: Secondary | ICD-10-CM | POA: Insufficient documentation

## 2022-10-25 NOTE — Assessment & Plan Note (Signed)
Healing well, scabbed over. Mild erythema present. No warmth or drainage noted. Do not feel that oral antibiotics are necessary. She will continue using mupirocin ointment and keeping the area clean and dry. She will monitor for signs of infection. Return precautions given to patient.

## 2022-10-30 ENCOUNTER — Inpatient Hospital Stay: Payer: Managed Care, Other (non HMO)

## 2022-10-30 ENCOUNTER — Inpatient Hospital Stay: Payer: Managed Care, Other (non HMO) | Admitting: Oncology

## 2022-10-31 ENCOUNTER — Other Ambulatory Visit: Payer: Self-pay | Admitting: Internal Medicine

## 2022-10-31 MED ORDER — HYDROCODONE-ACETAMINOPHEN 10-325 MG PO TABS: 1.0000 | ORAL_TABLET | Freq: Four times a day (QID) | ORAL | 0 refills | Status: DC | PRN

## 2022-11-03 ENCOUNTER — Other Ambulatory Visit: Payer: Self-pay | Admitting: Internal Medicine

## 2022-11-03 DIAGNOSIS — Z1231 Encounter for screening mammogram for malignant neoplasm of breast: Secondary | ICD-10-CM

## 2022-11-06 ENCOUNTER — Ambulatory Visit: Payer: Managed Care, Other (non HMO) | Admitting: Dermatology

## 2022-11-06 NOTE — Progress Notes (Unsigned)
[facility] [address] [place] [address] Phone: [phone] Subjective:   Adrienne Robinson, am serving as a scribe for Dr. Antoine Primas.  I'm seeing this patient by the request  of:  Sherlene Shams, MD  CC: Back pain, low back pain.  [id number]  08/17/2022 Chronic, with worsening symptoms. Still trying to avoid surgical intervention. Is nearly bone-on-bone. He does respond well to the injection. States that she gets approximately 2 months of improvement. Hopeful that patient will get some relief. Encouraged still bracing at night   Worsening symptoms again.  Discussed icing regimen and home exercises, which activities to do and which ones to avoid.  Increase activity slowly.  Follow-up again in 6 to 8 weeks     Chronic problem with worsening symptoms. Significant arthritic changes in multiple different areas.  Joint just like this symptomatically She can continue to be as active as possible.  Warned of potential side effects.  Patient should do well with this.  Increase activity as well.  Discussed scapular strengthening.  Follow-up again in 6 to 8 weeks     Updated 11/07/2022 Adrienne Robinson is a 67 y.o. female coming in with complaint of thumb pain. Back pain has been in spasm for 3 days and needs thumb injection.       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   Prepatellar effusion of right knee  06/09/2019   Aspiration done June 13, 2019   Scoliosis    Spinal stenosis of lumbar region    Squamous cell carcinoma of skin 06/24/2018   R lateral calf   Tubular adenoma of colon    Past Surgical History:  Procedure Laterality Date   ABDOMINAL HYSTERECTOMY     APPENDECTOMY     CARDIAC CATHETERIZATION     normal coronaries, no wall motion abnormalities 11/02/09 [facility])   CATARACT EXTRACTION W/PHACO Left 11/24/2020   Procedure: CATARACT EXTRACTION PHACO AND INTRAOCULAR LENS PLACEMENT (IOC) LEFT 6.09 00:46.7;  Surgeon: Lockie Mola, MD;  Location: [facility];  Service: Ophthalmology;  Laterality: Left;  patient wants early   CATARACT EXTRACTION W/PHACO Right 12/08/2020   Procedure: CATARACT EXTRACTION PHACO AND INTRAOCULAR LENS PLACEMENT (IOC) RIGHT 3.92 00:49.0;  Surgeon: Lockie Mola, MD;  Location: [facility];  Service: Ophthalmology;  Laterality: Right;  patient request early   NASAL SINUS SURGERY     OOPHORECTOMY     RADIOLOGY WITH ANESTHESIA  02/26/2012   Procedure: RADIOLOGY WITH ANESTHESIA;  Surgeon: Oneal Grout, MD;  Location: [facility] OR;  Service: Radiology;  Laterality: N/A;   REVERSE SHOULDER ARTHROPLASTY Right 06/27/2013   Procedure: REVERSE RIGHT TOTAL SHOULDER ARTHROPLASTY;  Surgeon: Verlee Rossetti, MD;  Location: [facility] OR;  Service: Orthopedics;  Laterality: Right;   SHOULDER ARTHROSCOPY WITH ROTATOR CUFF REPAIR AND SUBACROMIAL DECOMPRESSION  2007  left   TONSILLECTOMY  adnoids   Social History   Socioeconomic History   Marital status: Married    Spouse name: Not on file   Number of children: 0   Years of education: 18   Highest education level: Not on file  Occupational History   Occupation: Nurse executive   Tobacco Use   Smoking status: Never   Smokeless tobacco: Never  Vaping Use   Vaping status: Never Used  Substance and Sexual Activity   Alcohol use: Yes    Alcohol/week: 2.0 standard drinks of alcohol    Types: 2 Glasses  of wine per week    Comment: 2-3 week with dinner   Drug use: No   Sexual activity: Yes  Other Topics Concern   Not on file  Social History Narrative   Lives at home with her husband.   Right-handed.   2-3 diet cokes per day.   Social Determinants of Health   Financial Resource Strain: Not on file  Food Insecurity: Not on file  Transportation Needs: Not on file  Physical Activity: Not on file  Stress: Not on file  Social Connections: Unknown (07/03/2021)   Received from [facility]   Social Network    Social Network: Not on file   Allergies  Allergen Reactions   Buspirone Other (See Comments)    Dizziness,  dry mouth    Celebrex [Celecoxib] Swelling   Adhesive [Tape] Rash and Other (See Comments)    Redness, blisters, skin peeling off.   Morphine And Codeine Rash   Family History  Problem Relation Age of Onset   Thyroid disease Mother    Colon cancer Father 21   Diabetes Father    Heart disease Father    Rectal cancer Father    Breast cancer Paternal Grandmother    Colon cancer Paternal Grandfather    Colon cancer Other        pggm   Stomach cancer Neg Hx    Esophageal cancer Neg Hx      Current Outpatient Medications (Cardiovascular):    amLODipine (NORVASC) 10 MG tablet, Take 1 tablet (10 mg total) by mouth daily.   furosemide (LASIX) 20 MG tablet, TAKE 1 TABLET (20 MG TOTAL) BY MOUTH DAILY WITH BREAKFAST AS NEEDED FOR EDEMA   metoprolol succinate (TOPROL-XL) 25 MG 24 hr tablet, Take 1 tablet (25 mg total) by mouth daily.   propranolol (INDERAL) 10 MG tablet, TAKE 1/2 TABLET BY MOUTH AS NEEDED FOR ANXIETY   Current Outpatient Medications (Analgesics):    HYDROcodone-acetaminophen (NORCO) 10-325 MG tablet, Take 1 tablet by mouth every 6 (six) hours as needed for severe pain.  Current Outpatient Medications (Hematological):    cyanocobalamin (,VITAMIN B-12,) 1000 MCG/ML injection, INJECT 1 ML (1,000 MCG TOTAL) INTO THE MUSCLE ONCE A WEEK.  (Patient taking differently: Inject 1,000 mcg into the muscle every 30 (thirty) days.)   Iron-Vitamin C 65-125 MG TABS, Take 1 tablet by mouth daily.  Current Outpatient Medications (Other):    buPROPion (WELLBUTRIN XL) 300 MG 24 hr tablet, Take 1 tablet (300 mg total) by mouth daily.   esomeprazole (NEXIUM) 20 MG capsule, TAKE 1 CAPSULE (20 MG TOTAL) BY MOUTH DAILY. **PLEASE CONTACT OFFICE TO SCHEDULE FOLLOW UP   famciclovir (FAMVIR) 500 MG tablet, Take 1 tablet (500 mg total) by mouth 3 (three) times daily. For one week (Patient not taking: Reported on 10/09/2022)   LINZESS 145 MCG CAPS capsule, TAKE 1 CAPSULE BY MOUTH EVERY DAY  BEFORE BREAKFAST   Multiple Vitamin (MULTIVITAMIN) tablet, Take 1 tablet by mouth daily.   sodium chloride 1 g tablet, Take 1 tablet (1 g total) by mouth 2 (two) times daily with a meal.   Syringe/Needle, Disp, (SYRINGE 3CC/25GX1") 25G X 1" 3 ML MISC, Use for b12 injections   zolpidem (AMBIEN CR) 12.5 MG CR tablet, Take 12.5 mg by mouth at bedtime as needed.   Reviewed prior external information including notes and imaging from  primary care provider As well as notes that were available from care everywhere and other healthcare systems.  Past medical history, social, surgical and family history all reviewed in electronic medical record.  No pertanent information unless stated regarding to the chief complaint.   Review of Systems:  No headache, visual changes, nausea, vomiting, diarrhea, constipation, dizziness, abdominal pain, skin rash, fevers, chills, night sweats, weight loss, swollen lymph nodes, body aches, joint swelling, chest pain, shortness of breath, mood changes. POSITIVE muscle aches  Objective  Blood pressure 112/68, pulse 89, height 5\' 3"  (1.6 m), weight 115 lb (52.2 kg), SpO2 98%.   General: No apparent distress alert and oriented x3 mood and affect normal, dressed appropriately.  HEENT: Pupils equal, extraocular movements intact  Respiratory:  Patient's speak in full sentences and does not appear short of breath  Cardiovascular: No lower extremity edema, non tender, no erythema  Thumb exam shows show patient does have some crepitus noted.  Seems to be more on the left thumb.  Does have positive grind test noted.   Back exam shows shows the patient is having significant discomfort and pain noted.  Does have more of a muscle spasm on the right side of the thoracolumbar juncture.   After verbal consent patient was prepped with alcohol swab and with a 25-gauge half inch needle injected in 4 distinct trigger points in the send this paralysis muscle.  Some in the quadratus lumborum as well.  Total of 3 cc of 0.5% Marcaine and 1 cc of Kenalog 40 mg/mL used.  No blood loss.  Band-Aid placed.  Postinjection instructions given  Procedure: Real-time Ultrasound Guided Injection of left CMC joint Device: GE Logiq Q7 Ultrasound guided injection is preferred based studies that show increased duration, increased effect, greater accuracy, decreased procedural pain, increased response rate, and decreased cost with ultrasound guided versus blind injection.  Verbal informed consent obtained.  Time-out conducted.  Noted no overlying erythema, induration, or other signs of local infection.  Skin prepped in a sterile fashion.  Local anesthesia: Topical Ethyl chloride.  With sterile technique and under real time ultrasound guidance: With a 25-gauge half inch needle injected with 0.5 cc of 0.5% Marcaine and 0.5 cc of Kenalog 40 mg/mL Completed without difficulty  Advised to call if fevers/chills, erythema, induration, drainage, or persistent bleeding.  Impression: Technically successful ultrasound guided injection.    Impression and Recommendations:    The above documentation has been reviewed and is accurate and complete Judi Saa, DO

## 2022-11-07 ENCOUNTER — Ambulatory Visit: Payer: Managed Care, Other (non HMO) | Admitting: Family Medicine

## 2022-11-07 ENCOUNTER — Encounter: Payer: Self-pay | Admitting: Family Medicine

## 2022-11-07 ENCOUNTER — Other Ambulatory Visit: Payer: Self-pay

## 2022-11-07 VITALS — BP 112/68 | HR 89 | Ht 63.0 in | Wt 115.0 lb

## 2022-11-07 DIAGNOSIS — M546 Pain in thoracic spine: Secondary | ICD-10-CM | POA: Diagnosis not present

## 2022-11-07 DIAGNOSIS — G8929 Other chronic pain: Secondary | ICD-10-CM

## 2022-11-07 DIAGNOSIS — M1812 Unilateral primary osteoarthritis of first carpometacarpal joint, left hand: Secondary | ICD-10-CM

## 2022-11-07 DIAGNOSIS — M79645 Pain in left finger(s): Secondary | ICD-10-CM

## 2022-11-07 NOTE — Patient Instructions (Addendum)
Trigger point injections today Injection in thumb See you again in 2-3 months If not better you can call in for prednisone

## 2022-11-07 NOTE — Assessment & Plan Note (Signed)
Significant worsening edema the need to limit back.  Discussed icing regimen and home exercises.  Discussed which activities to do and which ones to avoid.  Increase activity slowly over the course of next several weeks.  Discussed icing regimen.  Follow-up again in 6 to 8 weeks worsening pain advanced imaging would be necessary.

## 2022-11-07 NOTE — Assessment & Plan Note (Signed)
Repeat injection given today.  Tolerated the procedure well, discussed icing regimen and home exercises, discussed which activities to do and which ones to avoid.  Increase activity slowly over the course of next several weeks.  Follow-up with me again in 6 to 8 weeks.

## 2022-11-13 ENCOUNTER — Ambulatory Visit: Payer: Managed Care, Other (non HMO) | Admitting: Dermatology

## 2022-11-16 ENCOUNTER — Ambulatory Visit: Payer: Managed Care, Other (non HMO)

## 2022-11-17 ENCOUNTER — Other Ambulatory Visit: Payer: Self-pay

## 2022-11-17 ENCOUNTER — Encounter: Payer: Self-pay | Admitting: Family Medicine

## 2022-11-17 MED ORDER — PREDNISONE 20 MG PO TABS: 40.0000 mg | ORAL_TABLET | Freq: Every day | ORAL | 0 refills | Status: DC

## 2022-11-19 ENCOUNTER — Encounter (HOSPITAL_BASED_OUTPATIENT_CLINIC_OR_DEPARTMENT_OTHER): Payer: Self-pay | Admitting: Urology

## 2022-11-19 ENCOUNTER — Emergency Department (HOSPITAL_BASED_OUTPATIENT_CLINIC_OR_DEPARTMENT_OTHER)
Admission: EM | Admit: 2022-11-19 | Discharge: 2022-11-19 | Disposition: A | Discharge status: Home / Self Care | Payer: Managed Care, Other (non HMO) | Attending: Emergency Medicine | Admitting: Emergency Medicine

## 2022-11-19 ENCOUNTER — Other Ambulatory Visit: Payer: Self-pay

## 2022-11-19 DIAGNOSIS — R531 Weakness: Secondary | ICD-10-CM | POA: Insufficient documentation

## 2022-11-19 DIAGNOSIS — E871 Hypo-osmolality and hyponatremia: Secondary | ICD-10-CM | POA: Insufficient documentation

## 2022-11-19 DIAGNOSIS — I10 Essential (primary) hypertension: Secondary | ICD-10-CM | POA: Insufficient documentation

## 2022-11-19 DIAGNOSIS — Z79899 Other long term (current) drug therapy: Secondary | ICD-10-CM | POA: Diagnosis not present

## 2022-11-19 LAB — COMPREHENSIVE METABOLIC PANEL
ALT: 49 U/L — ABNORMAL HIGH (ref 0–44)
AST: 60 U/L — ABNORMAL HIGH (ref 15–41)
Albumin: 4.5 g/dL (ref 3.5–5.0)
Alkaline Phosphatase: 90 U/L (ref 38–126)
Anion gap: 14 (ref 5–15)
BUN: 35 mg/dL — ABNORMAL HIGH (ref 8–23)
CO2: 22 mmol/L (ref 22–32)
Calcium: 9.4 mg/dL (ref 8.9–10.3)
Chloride: 93 mmol/L — ABNORMAL LOW (ref 98–111)
Creatinine, Ser: 0.81 mg/dL (ref 0.44–1.00)
GFR, Estimated: >60 mL/min (ref >60)
Glucose, Bld: 144 mg/dL — ABNORMAL HIGH (ref 70–99)
Potassium: 3.4 mmol/L — ABNORMAL LOW (ref 3.5–5.1)
Sodium: 129 mmol/L — ABNORMAL LOW (ref 135–145)
Total Bilirubin: 0.9 mg/dL (ref 0.3–1.2)
Total Protein: 7.7 g/dL (ref 6.5–8.1)

## 2022-11-19 LAB — CBC WITH DIFFERENTIAL/PLATELET
Abs Immature Granulocytes: 0.03 10*3/uL (ref 0.00–0.07)
Basophils Absolute: 0 10*3/uL (ref 0.0–0.1)
Basophils Relative: 0 %
Eosinophils Absolute: 0 10*3/uL (ref 0.0–0.5)
Eosinophils Relative: 0 %
HCT: 32.4 % — ABNORMAL LOW (ref 36.0–46.0)
Hemoglobin: 11.3 g/dL — ABNORMAL LOW (ref 12.0–15.0)
Immature Granulocytes: 0 %
Lymphocytes Relative: 6 %
Lymphs Abs: 0.7 10*3/uL (ref 0.7–4.0)
MCH: 33.8 pg (ref 26.0–34.0)
MCHC: 34.9 g/dL (ref 30.0–36.0)
MCV: 97 fL (ref 80.0–100.0)
Monocytes Absolute: 0.4 10*3/uL (ref 0.1–1.0)
Monocytes Relative: 4 %
Neutro Abs: 9.1 10*3/uL — ABNORMAL HIGH (ref 1.7–7.7)
Neutrophils Relative %: 90 %
Platelets: 265 10*3/uL (ref 150–400)
RBC: 3.34 MIL/uL — ABNORMAL LOW (ref 3.87–5.11)
RDW: 11.9 % (ref 11.5–15.5)
WBC: 10.2 10*3/uL (ref 4.0–10.5)
nRBC: 0 % (ref 0.0–0.2)

## 2022-11-19 MED ORDER — SODIUM CHLORIDE 0.9 % IV BOLUS: 500.0000 mL | Freq: Once | INTRAVENOUS | Status: DC

## 2022-11-19 MED ORDER — SODIUM CHLORIDE 0.9 % IV BOLUS
1000.0000 mL | Freq: Once | INTRAVENOUS | Status: AC
  Administered 2022-11-19: 1000 mL via INTRAVENOUS

## 2022-11-19 MED ORDER — POTASSIUM CHLORIDE CRYS ER 20 MEQ PO TBCR
40.0000 meq | EXTENDED_RELEASE_TABLET | Freq: Once | ORAL | Status: AC
  Administered 2022-11-19: 40 meq via ORAL
  Filled 2022-11-19: qty 2

## 2022-11-19 NOTE — ED Triage Notes (Signed)
Pt states h/o low sodium and states she feels the same symptoms States feels weak and has tingling in hands and feet that started yesterday  Denies N/V, reports back spasm that is chronic   ED provider at bedside

## 2022-11-19 NOTE — ED Provider Notes (Signed)
[place] [facility] AT [facility] Provider Note   CSN: [id number] Arrival date & time: 11/19/22  1055     History  Chief Complaint  Patient presents with   Weakness    Adrienne Robinson is a 67 y.o. female history of normocytic anemia, hypertension, AVM of the brain, B12 deficiency, hyponatremia presented for weakness that began yesterday while walking in the grocery store.  Patient states that she felt her hands and feet feel weak with paresthesias similar to when she has had low sodium in the past.  Patient is been eating and drinking okay with no nausea vomiting or diarrhea or fevers but notes that she did start prednisone however took her first dose of prednisone today.  Patient states that she still feels slightly weak however denies chest pain, shortness of breath, vision changes, headache, head trauma, neck pain.   Home Medications Prior to Admission medications   Medication Sig Start Date End Date Taking? Authorizing Provider  amLODipine (NORVASC) 10 MG tablet Take 1 tablet (10 mg total) by mouth daily. 10/09/22   Sherlene Shams, MD  buPROPion (WELLBUTRIN XL) 300 MG 24 hr tablet Take 1 tablet (300 mg total) by mouth daily. 08/01/22   Sherlene Shams, MD  cyanocobalamin (,VITAMIN B-12,) 1000 MCG/ML injection INJECT 1 ML (1,000 MCG TOTAL) INTO THE MUSCLE ONCE A WEEK. Patient taking differently: Inject 1,000 mcg into the muscle every 30 (thirty) days. 11/23/20   Sherlene Shams, MD  esomeprazole (NEXIUM) 20 MG capsule TAKE 1 CAPSULE (20 MG TOTAL) BY MOUTH DAILY. **PLEASE CONTACT OFFICE TO SCHEDULE FOLLOW UP 09/22/22   Pyrtle, Carie Caddy, MD  famciclovir (FAMVIR) 500 MG tablet Take 1 tablet (500 mg total) by mouth 3 (three) times daily. For one week Patient not taking: Reported on 10/09/2022 03/20/22   Sherlene Shams, MD  furosemide (LASIX) 20 MG tablet TAKE 1 TABLET (20 MG TOTAL) BY MOUTH DAILY WITH BREAKFAST AS NEEDED FOR EDEMA 03/21/22   Sherlene Shams, MD   HYDROcodone-acetaminophen (NORCO) 10-325 MG tablet Take 1 tablet by mouth every 6 (six) hours as needed for severe pain. 10/31/22 11/30/22  Sherlene Shams, MD  Iron-Vitamin C 65-125 MG TABS Take 1 tablet by mouth daily. 06/09/22   Rickard Patience, MD  LINZESS 145 MCG CAPS capsule TAKE 1 CAPSULE BY MOUTH EVERY DAY BEFORE BREAKFAST 02/14/22   Pyrtle, Carie Caddy, MD  metoprolol succinate (TOPROL-XL) 25 MG 24 hr tablet Take 1 tablet (25 mg total) by mouth daily. 10/09/22   Sherlene Shams, MD  Multiple Vitamin (MULTIVITAMIN) tablet Take 1 tablet by mouth daily.    [provider]  predniSONE (DELTASONE) 20 MG tablet Take 2 tablets (40 mg total) by mouth daily with breakfast. 11/17/22   Judi Saa, DO  propranolol (INDERAL) 10 MG tablet TAKE 1/2 TABLET BY MOUTH AS NEEDED FOR ANXIETY 10/21/21   Worthy Rancher B, FNP  sodium chloride 1 g tablet Take 1 tablet (1 g total) by mouth 2 (two) times daily with a meal. 09/15/20   Sherlene Shams, MD  Syringe/Needle, Disp, (SYRINGE 3CC/25GX1") 25G X 1" 3 ML MISC Use for b12 injections 08/21/18   Sherlene Shams, MD  zolpidem (AMBIEN CR) 12.5 MG CR tablet Take 12.5 mg by mouth at bedtime as needed. 09/21/22   [provider]      Allergies    Buspirone, Celebrex [celecoxib], Adhesive [tape], and Morphine and codeine    Review of Systems  Review of Systems  Neurological:  Positive for weakness.    Physical Exam Updated Vital Signs BP 139/83 (BP Location: Left Arm)   Pulse 77   Temp 98 F (36.7 C) (Oral)   Resp 18   Ht 5\' 3"  (1.6 m)   Wt 52.2 kg   SpO2 98%   BMI 20.39 kg/m  Physical Exam Vitals reviewed.  Constitutional:      General: She is not in acute distress. HENT:     Head: Normocephalic and atraumatic.  Eyes:     Extraocular Movements: Extraocular movements intact.     Conjunctiva/sclera: Conjunctivae normal.     Pupils: Pupils are equal, round, and reactive to light.  Cardiovascular:     Rate and Rhythm: Normal rate and regular  rhythm.     Pulses: Normal pulses.     Heart sounds: Normal heart sounds.     Comments: 2+ bilateral radial/dorsalis pedis pulses with regular rate Pulmonary:     Effort: Pulmonary effort is normal. No respiratory distress.     Breath sounds: Normal breath sounds.  Abdominal:     Palpations: Abdomen is soft.     Tenderness: There is no abdominal tenderness. There is no guarding or rebound.  Musculoskeletal:        General: Normal range of motion.     Cervical back: Normal range of motion and neck supple.     Comments: 5 out of 5 bilateral grip/leg extension strength  Skin:    General: Skin is warm and dry.     Capillary Refill: Capillary refill takes less than 2 seconds.  Neurological:     General: No focal deficit present.     Mental Status: She is alert and oriented to person, place, and time.     Sensory: Sensation is intact.     Motor: Motor function is intact.     Coordination: Coordination is intact.     Gait: Gait is intact.     Comments: Sensation intact in all 4 limbs Cranial nerves III through XII intact Vision grossly intact  Psychiatric:        Mood and Affect: Mood normal.     ED Results / Procedures / Treatments   Labs (all labs ordered are listed, but only abnormal results are displayed) Labs Reviewed  COMPREHENSIVE METABOLIC PANEL - Abnormal; Notable for the following components:      Result Value   Sodium 129 (*)    Potassium 3.4 (*)    Chloride 93 (*)    Glucose, Bld 144 (*)    BUN 35 (*)    AST 60 (*)    ALT 49 (*)    All other components within normal limits  CBC WITH DIFFERENTIAL/PLATELET - Abnormal; Notable for the following components:   RBC 3.34 (*)    Hemoglobin 11.3 (*)    HCT 32.4 (*)    Neutro Abs 9.1 (*)    All other components within normal limits    EKG EKG Interpretation Date/Time:  Sunday November 19 2022 11:14:15 EDT Ventricular Rate:  70 PR Interval:  140 QRS Duration:  93 QT Interval:  438 QTC Calculation: 473 R  Axis:   61  Text Interpretation: Sinus rhythm Probable left atrial enlargement Abnormal R-wave progression, early transition Minimal ST elevation, inferior leads No significant change since last tracing Confirmed by Melene Plan [id number]) on 11/19/2022 11:17:12 AM  Radiology No results found.  Procedures Procedures    Medications Ordered in ED Medications  potassium chloride  SA (KLOR-CON M) CR tablet 40 mEq (has no administration in time range)  sodium chloride 0.9 % bolus 1,000 mL (1,000 mLs Intravenous New Bag/Given 11/19/22 1223)    ED Course/ Medical Decision Making/ A&P                                 Medical Decision Making Amount and/or Complexity of Data Reviewed Labs: ordered.   LISA-MARIE RUEGER 67 y.o. presented today for weakness. Working DDx that I considered at this time includes, but not limited to, electrolyte abnormalities, dehydration, arrhythmia, CVA/TIA, spinal cord pathology, pneumonia.  R/o DDx: dehydration, arrhythmia, CVA/TIA, spinal cord pathology, pneumonia: These are considered less likely due to history of present illness, physical exam, labs/imaging findings  Review of prior external notes: 11/07/2022 office visit  Unique Tests and My Interpretation:  CBC: Unremarkable CMP: Sodium 129, potassium 3.4, chloride 93, transaminitis 60/49 EKG: Sinus 70 bpm, no significant ST elevations or depressions noted, similar to previous reading  Discussion with Independent Historian:  Daughter  Discussion of Management of Tests: None  Risk: Low: based on diagnostic testing/clinical impression and treatment plan  Risk Stratification Score: None  Plan: On exam patient was no acute distress with stable vitals.  Patient's physical exam is reassuring.  Patient does not appear dry on exam.  At patient's request will check electrolytes as patient thinks that her sodium is low.  Will throw in CBC along with EKG as well for patient's reported weakness.  Patient's labs are  ultimately reassuring.  Patient's sodium was 129 and the patient had shared decision making and agreed to 1 L of fluid with discharge.  I spoke to the patient about the possibility of a CT scan for her weakness however patient states that she feels this is overkill and would not be beneficial and so we will forego a CT scan at this time as patient believes her weakness is related to the sodium.  Encourage patient to continue taking her sodium at home and to follow-up with primary care provider.  Patient will be discharged after getting her liter of fluids.  Patient was given return precautions. Patient stable for discharge at this time.  Patient verbalized understanding of plan.         Final Clinical Impression(s) / ED Diagnoses Final diagnoses:  Weakness  Hyponatremia    Rx / DC Orders ED Discharge Orders     None         Remi Deter 11/19/22 1319    Melene Plan, DO 11/19/22 1324

## 2022-11-19 NOTE — Discharge Instructions (Signed)
Please follow-up with your primary care provider in regards to recent ER visit.  Today your sodium was 129 and you are given fluids along with potassium pills your potassium was 3.4.  Please continue take your sodium pills and eat a banana daily.  If symptoms change or worsen please return to ER.

## 2022-11-20 ENCOUNTER — Encounter: Payer: Self-pay | Admitting: Internal Medicine

## 2022-11-20 ENCOUNTER — Ambulatory Visit: Payer: Managed Care, Other (non HMO)

## 2022-11-20 ENCOUNTER — Encounter: Payer: Self-pay | Admitting: Family Medicine

## 2022-11-21 ENCOUNTER — Telehealth: Payer: Self-pay

## 2022-11-21 ENCOUNTER — Other Ambulatory Visit: Payer: Self-pay

## 2022-11-21 ENCOUNTER — Telehealth: Payer: Managed Care, Other (non HMO) | Admitting: Internal Medicine

## 2022-11-21 DIAGNOSIS — M5416 Radiculopathy, lumbar region: Secondary | ICD-10-CM

## 2022-11-21 NOTE — Telephone Encounter (Signed)
Patient called to schedule a virtual visit with Dr. Gilford Raid.  I scheduled an appointment for her at 5pm today.  Patient states she would like to discuss her low sodium. Patient states she got a liter of fluid on Sunday at the ED and she is still not feeling well. Patient states the tingling has stopped. Patient states she is still tired, weak, and her back is killing her. Patient states she needs to schedule an epideral.  Patient declined offer to speak with Access Nurse.  Patient states she is a Engineer, civil (consulting) with a master's degree.

## 2022-11-21 NOTE — Telephone Encounter (Signed)
Pt is scheduled for 11/24/2022.

## 2022-11-21 NOTE — Telephone Encounter (Signed)
Spoke with pt and rescheduled her appt from this afternoon to later in the week per Dr. Darrick Huntsman request.

## 2022-11-22 ENCOUNTER — Other Ambulatory Visit: Payer: Self-pay | Admitting: Internal Medicine

## 2022-11-22 ENCOUNTER — Other Ambulatory Visit: Payer: Self-pay | Admitting: Family Medicine

## 2022-11-23 ENCOUNTER — Telehealth: Payer: Self-pay

## 2022-11-23 ENCOUNTER — Ambulatory Visit
Admission: RE | Admit: 2022-11-23 | Discharge: 2022-11-23 | Disposition: A | Discharge status: Home / Self Care | Payer: Managed Care, Other (non HMO) | Source: Ambulatory Visit | Attending: Family Medicine | Admitting: Family Medicine

## 2022-11-23 ENCOUNTER — Ambulatory Visit: Payer: Managed Care, Other (non HMO)

## 2022-11-23 DIAGNOSIS — M5416 Radiculopathy, lumbar region: Secondary | ICD-10-CM

## 2022-11-23 MED ORDER — METHYLPREDNISOLONE ACETATE 40 MG/ML INJ SUSP (RADIOLOG
80.0000 mg | Freq: Once | INTRAMUSCULAR | Status: AC
  Administered 2022-11-23: 80 mg via EPIDURAL

## 2022-11-23 MED ORDER — IOPAMIDOL (ISOVUE-M 200) INJECTION 41%
1.0000 mL | Freq: Once | INTRAMUSCULAR | Status: AC
  Administered 2022-11-23: 1 mL via EPIDURAL

## 2022-11-23 NOTE — Transitions of Care (Post Inpatient/ED Visit) (Signed)
I spoke with pt very briefly; pt said she had epidural earlier today and pt is trying to rest now. Pt already has VV with Dr Darrick Huntsman on 11/24/22 at 11 AM. If able will call pt back in AM but if after appt with Dr Darrick Huntsman wlll not call pt. Sending ntoe to Dr Darrick Huntsman as Lorain Childes.     11/23/2022  Name: Adrienne Robinson MRN: [id number] DOB: 29-Aug-1955  Today's TOC FU Call Status: Today's TOC FU Call Status:: Unsuccessful Call (1st Attempt) Unsuccessful Call (1st Attempt) Date: 11/23/22 (pt had epidural done today andptis trying to rest at this time.)  Attempted to reach the patient regarding the most recent Inpatient/ED visit.  Follow Up Plan: Additional outreach attempts will be made to reach the patient to complete the Transitions of Care (Post Inpatient/ED visit) call.   Signature Lewanda Rife, LPN

## 2022-11-23 NOTE — Discharge Instructions (Signed)

## 2022-11-23 NOTE — Telephone Encounter (Signed)
Requesting: Zolpidem Contract: No UDS: No Last Visit: 05/23/2022 Next Visit: 11/24/2022 Last Refill: 09/21/2022  Please Advise

## 2022-11-24 ENCOUNTER — Encounter: Payer: Self-pay | Admitting: Internal Medicine

## 2022-11-24 ENCOUNTER — Other Ambulatory Visit (INDEPENDENT_AMBULATORY_CARE_PROVIDER_SITE_OTHER): Payer: Managed Care, Other (non HMO)

## 2022-11-24 ENCOUNTER — Telehealth: Payer: Managed Care, Other (non HMO) | Admitting: Internal Medicine

## 2022-11-24 ENCOUNTER — Ambulatory Visit
Admission: RE | Admit: 2022-11-24 | Discharge: 2022-11-24 | Disposition: A | Discharge status: Home / Self Care | Payer: Managed Care, Other (non HMO) | Source: Ambulatory Visit | Attending: Internal Medicine | Admitting: Internal Medicine

## 2022-11-24 ENCOUNTER — Other Ambulatory Visit: Payer: Self-pay | Admitting: Internal Medicine

## 2022-11-24 VITALS — BP 122/70 | HR 74 | Ht 63.0 in | Wt 115.5 lb

## 2022-11-24 DIAGNOSIS — F321 Major depressive disorder, single episode, moderate: Secondary | ICD-10-CM | POA: Diagnosis not present

## 2022-11-24 DIAGNOSIS — E871 Hypo-osmolality and hyponatremia: Secondary | ICD-10-CM

## 2022-11-24 DIAGNOSIS — M48061 Spinal stenosis, lumbar region without neurogenic claudication: Secondary | ICD-10-CM

## 2022-11-24 DIAGNOSIS — M810 Age-related osteoporosis without current pathological fracture: Secondary | ICD-10-CM

## 2022-11-24 DIAGNOSIS — Z1231 Encounter for screening mammogram for malignant neoplasm of breast: Secondary | ICD-10-CM

## 2022-11-24 DIAGNOSIS — F419 Anxiety disorder, unspecified: Secondary | ICD-10-CM

## 2022-11-24 MED ORDER — ESCITALOPRAM OXALATE 10 MG PO TABS: 10.0000 mg | ORAL_TABLET | Freq: Every day | ORAL | 0 refills | Status: DC

## 2022-11-24 MED ORDER — HYDROCODONE-ACETAMINOPHEN 10-325 MG PO TABS: 1.0000 | ORAL_TABLET | Freq: Four times a day (QID) | ORAL | 0 refills | Status: DC | PRN

## 2022-11-24 NOTE — Assessment & Plan Note (Signed)
Tapering off of wellbutrin and starting lexapro to manage anxiety.

## 2022-11-24 NOTE — Assessment & Plan Note (Signed)
S/P ESI  yesterda br Dr Alfredo Batty ,  previous done by  Dr Mosetta Putt at [facility].  Her chronic pain is Managed with a standing desk for work mode and opioids for non operable pain.  She has not had any ER visits for pain management and has not requested any early refills.  Her Refill history was confirmed via [place] Controlled Substance database by me today during her visit and there have been no prescriptions of controlled substances filled from any providers other than me. Refilling for 3 months

## 2022-11-24 NOTE — Patient Instructions (Addendum)
Continue wellbutrin for one week while you start lexapro at 1/2 tablet daily  After one week increase the lexapro to a full tablet and  reduce wellburin to every other day  After one more week,  stop wellbutrin and continue lexapro at a full tablet   Increase lexapro to 2 tablets after 2 full weeks of 1 tablet daily IF YOUR ANXIETY IS NOT CONTROLLED

## 2022-11-24 NOTE — Assessment & Plan Note (Signed)
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  Marland Kitchenshe was given I Liter of IV fluids and discharged home .    Suspect adrenal insufficiency ; checking serum and urine cortisol,  serum and urine osmolality,  and BMET today.  Endocrinology consult recommended.   Lab Results  Component Value Date   NA 129 (L) 11/19/2022   K 3.4 (L) 11/19/2022   CL 93 (L) 11/19/2022   CO2 22 11/19/2022

## 2022-11-24 NOTE — Progress Notes (Signed)
Virtual Visit via Caregility   Note   This format is felt to be most appropriate for this patient at this time.  All issues noted in this document were discussed and addressed.  No physical exam was performed (except for noted visual exam findings with Video Visits).   I connected with Adrienne Robinson  on 11/24/22 at 11:00 AM EDT by a video enabled telemedicine application or telephone and verified that I am speaking with the correct person using two identifiers. Location patient: home Location provider: work or home office Persons participating in the virtual visit: patient, provider  I discussed the limitations, risks, security and privacy concerns of performing an evaluation and management service by telephone and the availability of in person appointments. I also discussed with the patient that there may be a patient responsible charge related to this service. The patient expressed understanding and agreed to proceed.  Reason for visit: 1) medication refill  and 2 ) ER follow up   HPI:   History of hyponatremia , resolved with salt supplementation ,  presents after ER visit  on Sept 29 for generalized weakness following 2 day h/o numbness and tingling.  ER noted drop in sodium to 129 (was 138 in April )  AST/ALT also elevated.  BOP was 122 systolic. Had not used furosemide.  Had recent taken  oral  prednisone 40 mg daily for 5 days starting sept 28 (by Terrilee Files) .  Given 1 iter IV fluIDS AND DISCHARGED y      Low back pain :  aggravated by helping Gery Pray off of the commode.  (Temporary arrangement due to barry's cost) . She received an L4-5 ESI by Dr. Alfredo Batty at [facility] in [place] , ysterday and today is feeling much better today    ROS: See pertinent positives and negatives per HPI.  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   Prepatellar effusion of right knee 06/09/2019   Aspiration done June 13, 2019   Scoliosis    Spinal stenosis of lumbar region    Squamous cell carcinoma of skin 06/24/2018   R lateral calf   Tubular adenoma of colon     Past Surgical History:  Procedure Laterality Date   ABDOMINAL HYSTERECTOMY     APPENDECTOMY     CARDIAC CATHETERIZATION     normal coronaries, no wall motion abnormalities 11/02/09 [facility])   CATARACT EXTRACTION W/PHACO Left 11/24/2020   Procedure: CATARACT EXTRACTION PHACO AND INTRAOCULAR LENS PLACEMENT (IOC) LEFT 6.09 00:46.7;  Surgeon: Lockie Mola, MD;  Location: [facility];  Service: Ophthalmology;  Laterality: Left;  patient wants early   CATARACT EXTRACTION W/PHACO Right 12/08/2020   Procedure: CATARACT EXTRACTION PHACO AND INTRAOCULAR LENS PLACEMENT (IOC) RIGHT 3.92 00:49.0;  Surgeon: Lockie Mola, MD;  Location: [facility];  Service: Ophthalmology;  Laterality:  Right;  patient request early   NASAL SINUS SURGERY     OOPHORECTOMY     RADIOLOGY WITH ANESTHESIA  02/26/2012   Procedure: RADIOLOGY WITH ANESTHESIA;  Surgeon: Oneal Grout, MD;  Location: [facility] OR;  Service: Radiology;  Laterality: N/A;   REVERSE SHOULDER ARTHROPLASTY Right 06/27/2013   Procedure: REVERSE RIGHT TOTAL SHOULDER ARTHROPLASTY;  Surgeon: Verlee Rossetti, MD;  Location: [facility] OR;  Service: Orthopedics;  Laterality: Right;   SHOULDER ARTHROSCOPY WITH ROTATOR CUFF REPAIR AND SUBACROMIAL DECOMPRESSION  2007   left   TONSILLECTOMY  adnoids    Family  History  Problem Relation Age of Onset   Thyroid disease Mother    Colon cancer Father 35   Diabetes Father    Heart disease Father    Rectal cancer Father    Breast cancer Paternal Grandmother    Colon cancer Paternal Grandfather    Colon cancer Other        pggm   Stomach cancer Neg Hx    Esophageal cancer Neg Hx     SOCIAL HX:  reports that she has never smoked. She has never used smokeless tobacco. She reports current alcohol use of about 2.0 standard drinks of alcohol per week. She reports that she does not use drugs.    Current Outpatient Medications:    amLODipine (NORVASC) 10 MG tablet, Take 1 tablet (10 mg total) by mouth daily., Disp: 90 tablet, Rfl: 3   cyanocobalamin (,VITAMIN B-12,) 1000 MCG/ML injection, INJECT 1 ML (1,000 MCG TOTAL) INTO THE MUSCLE ONCE A WEEK. (Patient taking differently: Inject 1,000 mcg into the muscle every 30 (thirty) days.), Disp: 12 mL, Rfl: 6   escitalopram (LEXAPRO) 10 MG tablet, Take 1 tablet (10 mg total) by mouth daily., Disp: 90 tablet, Rfl: 0   esomeprazole (NEXIUM) 20 MG capsule, TAKE 1 CAPSULE (20 MG TOTAL) BY MOUTH DAILY. **PLEASE CONTACT OFFICE TO SCHEDULE FOLLOW UP, Disp: 90 capsule, Rfl: 0   furosemide (LASIX) 20 MG tablet, TAKE 1 TABLET (20 MG TOTAL) BY MOUTH DAILY WITH BREAKFAST AS NEEDED FOR EDEMA, Disp: 90 tablet, Rfl: 1   Iron-Vitamin C 65-125 MG TABS, Take 1 tablet by mouth daily., Disp: 90 tablet, Rfl: 1   LINZESS 145 MCG CAPS capsule, TAKE 1 CAPSULE BY MOUTH EVERY DAY BEFORE BREAKFAST, Disp: 90 capsule, Rfl: 0   metoprolol succinate (TOPROL-XL) 25 MG 24 hr tablet, Take 1 tablet (25 mg total) by mouth daily., Disp: 90 tablet, Rfl: 1   Multiple Vitamin (MULTIVITAMIN) tablet, Take 1 tablet by mouth daily., Disp: , Rfl:    propranolol (INDERAL) 10 MG tablet, TAKE 1/2 TABLET BY MOUTH AS NEEDED FOR ANXIETY, Disp: 15 tablet, Rfl: 1   sodium chloride 1 g tablet, Take 1 tablet (1 g total) by mouth 2 (two) times daily with a meal., Disp:  60 tablet, Rfl: 2   Syringe/Needle, Disp, (SYRINGE 3CC/25GX1") 25G X 1" 3 ML MISC, Use for b12 injections, Disp: 50 each, Rfl: 0   zolpidem (AMBIEN CR) 12.5 MG CR tablet, TAKE 1 TABLET (12.5 MG TOTAL) BY MOUTH AT BEDTIME AS NEEDED. FOR SLEEP, Disp: 30 tablet, Rfl: 5   HYDROcodone-acetaminophen (NORCO) 10-325 MG tablet, Take 1 tablet by mouth every 6 (six) hours as needed for severe pain., Disp: 120 tablet, Rfl: 0  EXAM:  VITALS per patient if applicable:  GENERAL: alert, oriented, appears well and in no acute distress  HEENT: atraumatic, conjunttiva clear, no obvious abnormalities on inspection of external nose and ears  NECK: normal movements of the head and neck  LUNGS: on inspection no signs of respiratory distress, breathing rate appears normal, no obvious gross SOB, gasping or wheezing  CV: no obvious cyanosis  MS: moves all visible extremities without noticeable abnormality  PSYCH/NEURO: pleasant and cooperative, no obvious depression or anxiety, speech and thought processing grossly intact  ASSESSMENT AND PLAN: Hyponatremia Assessment & Plan: 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  Marland Kitchenshe was given I Liter of IV fluids and discharged home .    Suspect adrenal insufficiency ; checking serum and urine cortisol,  serum and urine osmolality,  and BMET today.  Endocrinology consult recommended.   Lab Results  Component Value Date   NA 129 (L) 11/19/2022   K 3.4 (L) 11/19/2022   CL 93 (L) 11/19/2022   CO2 22 11/19/2022      Major depressive disorder, single episode, moderate (HCC) Assessment & Plan: Tapering off of wellbutrin and starting lexapro to manage anxiety.    Spinal stenosis of lumbar region without neurogenic claudication Assessment & Plan: S/P  ESI  yesterda br Dr Alfredo Batty ,  previous done by  Dr Mosetta Putt at [facility].  Her chronic pain is Managed with a standing desk for work mode and opioids for non operable pain.  She has not had any ER visits for pain management and has not requested any early refills.  Her Refill history was confirmed via [place] Controlled Substance database by me today during her visit and there have been no prescriptions of controlled substances filled from any providers other than me. Refilling for 3 months    Osteoporosis without current pathological fracture, unspecified osteoporosis type Assessment & Plan: She has postponed her Evenity injection to Oct 11 due to schedule conflicts    Other orders -     HYDROcodone-Acetaminophen; Take 1 tablet by mouth every 6 (six) hours as needed for severe pain.  Dispense: 120 tablet; Refill: 0 -     Escitalopram Oxalate; Take 1 tablet (10 mg total) by mouth daily.  Dispense: 90 tablet; Refill: 0      I discussed the assessment and treatment plan with the patient. The patient was provided an opportunity to ask questions and all were answered. The patient agreed with the plan and demonstrated an understanding of the instructions.   The patient was advised to call back or seek an in-person evaluation if the symptoms worsen or if the condition fails to improve as anticipated.   I spent 30 minutes dedicated to the care of this patient on the date of this encounter to include pre-visit review of his medical history,  Face-to-face time with the patient , and post visit ordering of testing and therapeutics.    Sherlene Shams, MD

## 2022-11-24 NOTE — Assessment & Plan Note (Signed)
She has postponed her Evenity injection to Oct 11 due to schedule conflicts

## 2022-11-24 NOTE — Assessment & Plan Note (Signed)
She did not tolerate an increase in wellbutrin to 300 mg and would like to change therapy to manage anxiety.  Wellbutrin 150 mg taper x 2 weeks and start of lexapro outlined. Will avoid benzo's given chronic opiod use.  If additional calming meds are needed will increase lexapro to 20 mg and add buspar or hydroxyzine

## 2022-11-24 NOTE — Assessment & Plan Note (Addendum)
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  Marland Kitchenshe was given I Liter of IV fluids and discharged home .    Suspect adrenal insufficiency ; checking serum and urine cortisol,  serum and urine osmolality,  and BMET today.  Endocrinology consult recommended.   Lab Results  Component Value Date   NA 129 (L) 11/19/2022   K 3.4 (L) 11/19/2022   CL 93 (L) 11/19/2022   CO2 22 11/19/2022

## 2022-11-25 LAB — BASIC METABOLIC PANEL
BUN: 16 mg/dL (ref 7–25)
CO2: 25 mmol/L (ref 20–32)
Calcium: 10.1 mg/dL (ref 8.6–10.4)
Chloride: 99 mmol/L (ref 98–110)
Creat: 0.67 mg/dL (ref 0.50–1.05)
Glucose, Bld: 90 mg/dL (ref 65–99)
Potassium: 3.6 mmol/L (ref 3.5–5.3)
Sodium: 135 mmol/L (ref 135–146)

## 2022-11-25 LAB — OSMOLALITY: Osmolality: 286 mosm/kg (ref 278–305)

## 2022-11-25 LAB — CORTISOL: Cortisol: 3.2 ug/dL — ABNORMAL LOW (ref 6.2–19.4)

## 2022-11-26 LAB — OSMOLALITY, URINE: Osmolality, Ur: 178 mosm/kg (ref 50–1200)

## 2022-11-26 LAB — SODIUM, URINE, RANDOM: Sodium, Ur: 30 mmol/L (ref 28–272)

## 2022-11-27 ENCOUNTER — Other Ambulatory Visit: Payer: Self-pay | Admitting: Internal Medicine

## 2022-11-28 LAB — CORTISOL, URINE, FREE: Cortisol,F,ug/L,U: <1 ug/L

## 2022-11-29 ENCOUNTER — Encounter: Payer: Self-pay | Admitting: Family Medicine

## 2022-11-29 ENCOUNTER — Other Ambulatory Visit: Payer: Self-pay

## 2022-11-29 ENCOUNTER — Encounter: Payer: Self-pay | Admitting: Internal Medicine

## 2022-11-29 DIAGNOSIS — M47816 Spondylosis without myelopathy or radiculopathy, lumbar region: Secondary | ICD-10-CM

## 2022-11-29 DIAGNOSIS — E871 Hypo-osmolality and hyponatremia: Secondary | ICD-10-CM

## 2022-12-01 ENCOUNTER — Other Ambulatory Visit (INDEPENDENT_AMBULATORY_CARE_PROVIDER_SITE_OTHER): Payer: Managed Care, Other (non HMO)

## 2022-12-01 ENCOUNTER — Ambulatory Visit: Payer: Managed Care, Other (non HMO) | Admitting: *Deleted

## 2022-12-01 DIAGNOSIS — M810 Age-related osteoporosis without current pathological fracture: Secondary | ICD-10-CM

## 2022-12-01 DIAGNOSIS — E871 Hypo-osmolality and hyponatremia: Secondary | ICD-10-CM | POA: Diagnosis not present

## 2022-12-01 MED ORDER — ROMOSOZUMAB-AQQG 105 MG/1.17ML ~~LOC~~ SOSY
210.0000 mg | PREFILLED_SYRINGE | Freq: Once | SUBCUTANEOUS | Status: AC
Start: 2022-12-01 — End: 2022-12-01
  Administered 2022-12-01: 210 mg via SUBCUTANEOUS

## 2022-12-01 NOTE — Telephone Encounter (Signed)
Patient called abut MyChart message. Patient is coming in for nv at 3:45. She would like to have labs also.

## 2022-12-01 NOTE — Telephone Encounter (Signed)
Lab hs been ordered and pt has been added to the lab schedule.

## 2022-12-01 NOTE — Progress Notes (Signed)
Patient presented for Eventity injection to Right arm [place],and Left arm Sub-Cutaneous patient voiced no concerns or complaints during or after injection. Last Calcium 9.4 on 11/19/22.  Last injection 10/19/22.

## 2022-12-01 NOTE — Addendum Note (Signed)
Addended by: Warden Fillers on: 12/01/2022 11:56 AM   Modules accepted: Orders

## 2022-12-02 ENCOUNTER — Other Ambulatory Visit: Payer: Self-pay | Admitting: Internal Medicine

## 2022-12-02 LAB — COMPREHENSIVE METABOLIC PANEL
ALT: 20 [IU]/L (ref 0–32)
AST: 21 [IU]/L (ref 0–40)
Albumin: 4.1 g/dL (ref 3.9–4.9)
Alkaline Phosphatase: 64 [IU]/L (ref 44–121)
BUN/Creatinine Ratio: 23 (ref 12–28)
BUN: 20 mg/dL (ref 8–27)
Bilirubin Total: <0.2 mg/dL (ref 0.0–1.2)
CO2: 22 mmol/L (ref 20–29)
Calcium: 8.8 mg/dL (ref 8.7–10.3)
Chloride: 100 mmol/L (ref 96–106)
Creatinine, Ser: 0.86 mg/dL (ref 0.57–1.00)
Globulin, Total: 2.1 g/dL (ref 1.5–4.5)
Glucose: 88 mg/dL (ref 70–99)
Potassium: 4 mmol/L (ref 3.5–5.2)
Sodium: 137 mmol/L (ref 134–144)
Total Protein: 6.2 g/dL (ref 6.0–8.5)
eGFR: 74 mL/min/{1.73_m2} (ref >59)

## 2022-12-02 MED ORDER — HYDROCODONE-ACETAMINOPHEN 10-325 MG PO TABS: 1.0000 | ORAL_TABLET | Freq: Four times a day (QID) | ORAL | 0 refills | Status: DC | PRN

## 2022-12-11 ENCOUNTER — Telehealth: Payer: Self-pay | Admitting: Internal Medicine

## 2022-12-11 ENCOUNTER — Encounter: Payer: Self-pay | Admitting: Family Medicine

## 2022-12-11 ENCOUNTER — Telehealth: Payer: Self-pay

## 2022-12-11 ENCOUNTER — Ambulatory Visit: Payer: Managed Care, Other (non HMO)

## 2022-12-11 DIAGNOSIS — E871 Hypo-osmolality and hyponatremia: Secondary | ICD-10-CM

## 2022-12-11 NOTE — Telephone Encounter (Signed)
BMET ordered.  Her last 2 sodium levels were normal

## 2022-12-11 NOTE — Telephone Encounter (Signed)
Patient called and would like provider to add the order for lab as she comes in today for flu.

## 2022-12-11 NOTE — Telephone Encounter (Signed)
Does pt need labs drawn today

## 2022-12-11 NOTE — Telephone Encounter (Signed)
Spoke with pt and scheduled her for a lab appt tomorrow.  

## 2022-12-11 NOTE — Telephone Encounter (Signed)
I called patient to reschedule her 4pm nurse visit.  Patient states her sodium is low.  Patient states she is weak, tired, hands are tingling.  Patient declined offer to speak with Access Nurse.  Patient states she is at work.  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  Patient states if she decides to go to the ED, then she will call us back and let us know.

## 2022-12-12 ENCOUNTER — Other Ambulatory Visit: Payer: Managed Care, Other (non HMO)

## 2022-12-12 ENCOUNTER — Other Ambulatory Visit (INDEPENDENT_AMBULATORY_CARE_PROVIDER_SITE_OTHER): Payer: Managed Care, Other (non HMO)

## 2022-12-12 DIAGNOSIS — E871 Hypo-osmolality and hyponatremia: Secondary | ICD-10-CM

## 2022-12-13 ENCOUNTER — Telehealth: Payer: Managed Care, Other (non HMO) | Admitting: Internal Medicine

## 2022-12-13 ENCOUNTER — Encounter: Payer: Self-pay | Admitting: Internal Medicine

## 2022-12-13 VITALS — BP 114/72 | Ht 63.0 in | Wt 113.4 lb

## 2022-12-13 DIAGNOSIS — M810 Age-related osteoporosis without current pathological fracture: Secondary | ICD-10-CM

## 2022-12-13 DIAGNOSIS — E871 Hypo-osmolality and hyponatremia: Secondary | ICD-10-CM | POA: Diagnosis not present

## 2022-12-13 DIAGNOSIS — G894 Chronic pain syndrome: Secondary | ICD-10-CM | POA: Diagnosis not present

## 2022-12-13 LAB — BASIC METABOLIC PANEL
BUN: 8 mg/dL (ref 6–23)
CO2: 25 meq/L (ref 19–32)
Calcium: 8.8 mg/dL (ref 8.4–10.5)
Chloride: 98 meq/L (ref 96–112)
Creatinine, Ser: 0.62 mg/dL (ref 0.40–1.20)
GFR: 92.46 mL/min (ref >60.00)
Glucose, Bld: 101 mg/dL — ABNORMAL HIGH (ref 70–99)
Potassium: 3.3 meq/L — ABNORMAL LOW (ref 3.5–5.1)
Sodium: 131 meq/L — ABNORMAL LOW (ref 135–145)

## 2022-12-13 NOTE — Patient Instructions (Addendum)
Stop the lexapro   increase the sodium  by 1  gram daily for 3 days and  take a potassium chloride one tablet daily for 3 days   Recheck your sodium on Friday   Referral to [facility] in progress

## 2022-12-13 NOTE — Progress Notes (Unsigned)
Virtual Visit via Caregility   Note   This format is felt to be most appropriate for this patient at this time.  All issues noted in this document were discussed and addressed.  No physical exam was performed (except for noted visual exam findings with Video Visits).   I connected with Adrienne Robinson  on 12/13/22 at  5:00 PM EDT by a video enabled telemedicine application or telephone and verified that I am speaking with the correct person using two identifiers. Location patient: home Location provider: work or home office Persons participating in the virtual visit: patient, provider  I discussed the limitations, risks, security and privacy concerns of performing an evaluation and management service by telephone and the availability of in person appointments. I also discussed with the patient that there may be a patient responsible charge related to this service. The patient expressed understanding and agreed to proceed.  Interactive audio and video telecommunications were attempted between this provider and patient, however failed, due to patient having technical difficulties OR patient did not have access to video capability.  We continued and completed visit with audio only. 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 Recently noted to have adrenal suppression    ROS: See pertinent positives and negatives per HPI.  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   Prepatellar effusion of right knee 06/09/2019   Aspiration done June 13, 2019   Scoliosis    Spinal stenosis of lumbar region    Squamous cell carcinoma of skin 06/24/2018   R lateral calf   Tubular adenoma of colon     Past Surgical History:  Procedure Laterality Date   ABDOMINAL HYSTERECTOMY     APPENDECTOMY     CARDIAC CATHETERIZATION     normal coronaries, no wall motion abnormalities 11/02/09 [facility])   CATARACT EXTRACTION W/PHACO Left 11/24/2020   Procedure: CATARACT EXTRACTION PHACO AND INTRAOCULAR LENS PLACEMENT (IOC) LEFT 6.09 00:46.7;  Surgeon: Lockie Mola, MD;  Location: [facility];  Service: Ophthalmology;  Laterality: Left;  patient wants early   CATARACT EXTRACTION W/PHACO Right 12/08/2020   Procedure: CATARACT EXTRACTION PHACO AND INTRAOCULAR LENS PLACEMENT (IOC) RIGHT 3.92 00:49.0;  Surgeon: Lockie Mola, MD;  Location: [facility];  Service: Ophthalmology;  Laterality: Right;  patient request early   NASAL SINUS SURGERY     OOPHORECTOMY     RADIOLOGY WITH ANESTHESIA  02/26/2012   Procedure: RADIOLOGY WITH ANESTHESIA;  Surgeon: Oneal Grout, MD;  Location: [facility] OR;  Service: Radiology;  Laterality: N/A;   REVERSE SHOULDER ARTHROPLASTY Right 06/27/2013   Procedure: REVERSE RIGHT TOTAL SHOULDER ARTHROPLASTY;  Surgeon: Verlee Rossetti, MD;  Location: [facility] OR;  Service: Orthopedics;  Laterality: Right;  SHOULDER ARTHROSCOPY WITH ROTATOR CUFF REPAIR AND SUBACROMIAL DECOMPRESSION  2007   left   TONSILLECTOMY  adnoids    Family History  Problem Relation Age of Onset   Breast cancer Mother 65   Thyroid disease Mother    Colon cancer Father 35   Diabetes Father    Heart disease Father    Rectal cancer Father    Breast cancer Paternal Grandmother    Colon cancer Paternal  Grandfather    Colon cancer Other        pggm   Stomach cancer Neg Hx    Esophageal cancer Neg Hx     SOCIAL HX: ***   Current Outpatient Medications:    amLODipine (NORVASC) 10 MG tablet, Take 1 tablet (10 mg total) by mouth daily., Disp: 90 tablet, Rfl: 3   cyanocobalamin (,VITAMIN B-12,) 1000 MCG/ML injection, INJECT 1 ML (1,000 MCG TOTAL) INTO THE MUSCLE ONCE A WEEK. (Patient taking differently: Inject 1,000 mcg into the muscle every 30 (thirty) days.), Disp: 12 mL, Rfl: 6   escitalopram (LEXAPRO) 10 MG tablet, Take 1 tablet (10 mg total) by mouth daily., Disp: 90 tablet, Rfl: 0   esomeprazole (NEXIUM) 20 MG capsule, TAKE 1 CAPSULE (20 MG TOTAL) BY MOUTH DAILY. **PLEASE CONTACT OFFICE TO SCHEDULE FOLLOW UP, Disp: 90 capsule, Rfl: 0   HYDROcodone-acetaminophen (NORCO) 10-325 MG tablet, Take 1 tablet by mouth every 6 (six) hours as needed for severe pain., Disp: 120 tablet, Rfl: 0   HYDROcodone-acetaminophen (NORCO) 10-325 MG tablet, Take 1 tablet by mouth every 6 (six) hours as needed for severe pain., Disp: 120 tablet, Rfl: 0   Iron-Vitamin C 65-125 MG TABS, Take 1 tablet by mouth daily., Disp: 90 tablet, Rfl: 1   LINZESS 145 MCG CAPS capsule, TAKE 1 CAPSULE BY MOUTH EVERY DAY BEFORE BREAKFAST, Disp: 90 capsule, Rfl: 0   metoprolol succinate (TOPROL-XL) 25 MG 24 hr tablet, Take 1 tablet (25 mg total) by mouth daily., Disp: 90 tablet, Rfl: 1   Multiple Vitamin (MULTIVITAMIN) tablet, Take 1 tablet by mouth daily., Disp: , Rfl:    propranolol (INDERAL) 10 MG tablet, TAKE 1/2 TABLET BY MOUTH AS NEEDED FOR ANXIETY, Disp: 15 tablet, Rfl: 1   sodium chloride 1 g tablet, Take 1 tablet (1 g total) by mouth 2 (two) times daily with a meal., Disp: 60 tablet, Rfl: 2   Syringe/Needle, Disp, (SYRINGE 3CC/25GX1") 25G X 1" 3 ML MISC, Use for b12 injections, Disp: 50 each, Rfl: 0   zolpidem (AMBIEN CR) 12.5 MG CR tablet, TAKE 1 TABLET (12.5 MG TOTAL) BY MOUTH AT BEDTIME AS NEEDED. FOR SLEEP, Disp: 30  tablet, Rfl: 5   furosemide (LASIX) 20 MG tablet, TAKE 1 TABLET (20 MG TOTAL) BY MOUTH DAILY WITH BREAKFAST AS NEEDED FOR EDEMA (Patient not taking: Reported on 12/13/2022), Disp: 90 tablet, Rfl: 1  EXAM:  VITALS per patient if applicable:  GENERAL: alert, oriented, appears well and in no acute distress  HEENT: atraumatic, conjunttiva clear, no obvious abnormalities on inspection of external nose and ears  NECK: normal movements of the head and neck  LUNGS: on inspection no signs of respiratory distress, breathing rate appears normal, no obvious gross SOB, gasping or wheezing  CV: no obvious cyanosis  MS: moves all visible extremities without noticeable abnormality  PSYCH/NEURO: pleasant and cooperative, no obvious depression or anxiety, speech and thought processing grossly intact  ASSESSMENT AND PLAN: There are no diagnoses linked to this encounter.    I discussed the  assessment and treatment plan with the patient. The patient was provided an opportunity to ask questions and all were answered. The patient agreed with the plan and demonstrated an understanding of the instructions.   The patient was advised to call back or seek an in-person evaluation if the symptoms worsen or if the condition fails to improve as anticipated.   I spent 30 minutes dedicated to the care of this patient on the date of this encounter to include pre-visit review of his medical history,  Face-to-face time with the patient , and post visit ordering of testing and therapeutics.    Sherlene Shams, MD

## 2022-12-14 ENCOUNTER — Other Ambulatory Visit: Payer: Self-pay | Admitting: Internal Medicine

## 2022-12-14 ENCOUNTER — Encounter: Payer: Self-pay | Admitting: Internal Medicine

## 2022-12-14 NOTE — Assessment & Plan Note (Addendum)
Managed with monthly Evenity injections , last one Dec 01 2022

## 2022-12-14 NOTE — Assessment & Plan Note (Signed)
Recurrence may be due to use of Lexapro.  Medication stopped.  Advised to increase sodium to 3 grams daily for 3 days .  Sodium recheck on Friday

## 2022-12-14 NOTE — Assessment & Plan Note (Signed)
She is using pharmcologic and non pharmacologic methods to manage her arthritis pain  .  she is using a standing desk for work mode and opioids for non operable pain.  She has not had any ER visits for pain management and has not requested any early refills.  Her Refill history was confirmed via [place] Controlled Substance database by me today during her visit and there have been no prescriptions of controlled substances filled from any providers other than me. Refilling for 3 months at a time.

## 2022-12-15 ENCOUNTER — Other Ambulatory Visit: Payer: Managed Care, Other (non HMO)

## 2022-12-20 ENCOUNTER — Ambulatory Visit: Payer: Managed Care, Other (non HMO)

## 2022-12-20 NOTE — Progress Notes (Deleted)
[facility] [address] [place] [address] Phone: [phone] Subjective:    I'm seeing this patient by the request  of:  Sherlene Shams, MD  CC:   [id number]  11/07/2022 Significant worsening edema the need to limit back. Discussed icing regimen and home exercises. Discussed which activities to do and which ones to avoid. Increase activity slowly over the course of next several weeks. Discussed icing regimen. Follow-up again in 6 to 8 weeks worsening pain advanced imaging would be necessary.  Repeat injection given today.  Tolerated the procedure well, discussed icing regimen and home exercises, discussed which activities to do and which ones to avoid.  Increase activity slowly over the course of next several weeks.  Follow-up with me again in 6 to 8 weeks.     Updated 12/21/2022 Adrienne Robinson is a 67 y.o. female coming in with complaint of pinched nerve?       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   Prepatellar effusion of right knee 06/09/2019   Aspiration done June 13, 2019   Scoliosis    Spinal stenosis of lumbar region    Squamous cell carcinoma of skin 06/24/2018   R lateral calf   Tubular adenoma of colon    Past Surgical History:  Procedure Laterality Date   ABDOMINAL HYSTERECTOMY     APPENDECTOMY     CARDIAC CATHETERIZATION     normal  coronaries, no wall motion abnormalities 11/02/09 [facility])   CATARACT EXTRACTION W/PHACO Left 11/24/2020   Procedure: CATARACT EXTRACTION PHACO AND INTRAOCULAR LENS PLACEMENT (IOC) LEFT 6.09 00:46.7;  Surgeon: Lockie Mola, MD;  Location: [facility];  Service: Ophthalmology;  Laterality: Left;  patient wants early   CATARACT EXTRACTION W/PHACO Right 12/08/2020   Procedure: CATARACT EXTRACTION PHACO AND INTRAOCULAR LENS PLACEMENT (IOC) RIGHT 3.92 00:49.0;  Surgeon: Lockie Mola, MD;  Location: [facility];  Service: Ophthalmology;  Laterality: Right;  patient request early   NASAL SINUS SURGERY     OOPHORECTOMY     RADIOLOGY WITH ANESTHESIA  02/26/2012   Procedure: RADIOLOGY WITH ANESTHESIA;  Surgeon: Oneal Grout, MD;  Location: [facility] OR;  Service: Radiology;  Laterality: N/A;   REVERSE SHOULDER ARTHROPLASTY Right 06/27/2013   Procedure: REVERSE RIGHT TOTAL SHOULDER ARTHROPLASTY;  Surgeon: Verlee Rossetti, MD;  Location[facility];  Service: Orthopedics;  Laterality: Right;   SHOULDER ARTHROSCOPY WITH ROTATOR CUFF REPAIR AND SUBACROMIAL DECOMPRESSION  2007   left   TONSILLECTOMY  adnoids   Social History   Socioeconomic History   Marital status: Married    Spouse name: Not on file   Number of children: 0   Years of education: 18   Highest education level: Not on file  Occupational History   Occupation: Nurse executive   Tobacco  Use   Smoking status: Never   Smokeless tobacco: Never  Vaping Use   Vaping status: Never Used  Substance and Sexual Activity   Alcohol use: Yes    Alcohol/week: 2.0 standard drinks of alcohol    Types: 2 Glasses of wine per week    Comment: 2-3 week with dinner   Drug use: No   Sexual activity: Yes  Other Topics Concern   Not on file  Social History Narrative   Lives at home with her husband.   Right-handed.   2-3 diet cokes per day.   Social Determinants of Health   Financial Resource Strain: Not on file  Food  Insecurity: Not on file  Transportation Needs: Not on file  Physical Activity: Not on file  Stress: Not on file  Social Connections: Unknown (07/03/2021)   [facility]   Social Network    Social Network: Not on file   Allergies  Allergen Reactions   Buspirone Other (See Comments)    Dizziness,  dry mouth    Celebrex [Celecoxib] Swelling   Adhesive [Tape] Rash and Other (See Comments)    Redness, blisters, skin peeling off.   Morphine And Codeine Rash   Family History  Problem Relation Age of Onset   Breast cancer Mother 23   Thyroid disease Mother    Colon cancer Father 1   Diabetes Father    Heart disease Father    Rectal cancer Father    Breast cancer Paternal Grandmother    Colon cancer Paternal Grandfather    Colon cancer Other        pggm   Stomach cancer Neg Hx    Esophageal cancer Neg Hx      Current Outpatient Medications (Cardiovascular):    amLODipine (NORVASC) 10 MG tablet, Take 1 tablet (10 mg total) by mouth daily.   furosemide (LASIX) 20 MG tablet, TAKE 1 TABLET (20 MG TOTAL) BY MOUTH DAILY WITH BREAKFAST AS NEEDED FOR EDEMA (Patient not taking: Reported on 12/13/2022)   metoprolol succinate (TOPROL-XL) 25 MG 24 hr tablet, Take 1 tablet (25 mg total) by mouth daily.   propranolol (INDERAL) 10 MG tablet, TAKE 1/2 TABLET BY MOUTH AS NEEDED FOR ANXIETY   Current Outpatient Medications (Analgesics):    HYDROcodone-acetaminophen (NORCO) 10-325 MG tablet, Take 1 tablet by mouth every 6 (six) hours as needed for severe pain.   HYDROcodone-acetaminophen (NORCO) 10-325 MG tablet, Take 1 tablet by mouth every 6 (six) hours as needed for severe pain.  Current Outpatient Medications (Hematological):    cyanocobalamin (,VITAMIN B-12,) 1000 MCG/ML injection, INJECT 1 ML (1,000 MCG TOTAL) INTO THE MUSCLE ONCE A WEEK. (Patient taking differently: Inject 1,000 mcg into the muscle every 30 (thirty) days.)   Iron-Vitamin C 65-125 MG TABS, Take  1 tablet by mouth daily.  Current Outpatient Medications (Other):    esomeprazole (NEXIUM) 20 MG capsule, TAKE 1 CAPSULE (20 MG TOTAL) BY MOUTH DAILY. **PLEASE CONTACT OFFICE TO SCHEDULE FOLLOW UP   LINZESS 145 MCG CAPS capsule, TAKE 1 CAPSULE BY MOUTH EVERY DAY BEFORE BREAKFAST   Multiple Vitamin (MULTIVITAMIN) tablet, Take 1 tablet by mouth daily.   sodium chloride 1 g tablet, Take 1 tablet (1 g total) by mouth 2 (two) times daily with a meal.   Syringe/Needle, Disp, (SYRINGE 3CC/25GX1") 25G X 1" 3 ML MISC, Use for b12 injections   zolpidem (AMBIEN CR) 12.5 MG CR tablet, TAKE 1 TABLET (12.5 MG TOTAL) BY MOUTH AT BEDTIME AS  NEEDED. FOR SLEEP   Reviewed prior external information including notes and imaging from  primary care provider As well as notes that were available from care everywhere and other healthcare systems.  Past medical history, social, surgical and family history all reviewed in electronic medical record.  No pertanent information unless stated regarding to the chief complaint.   Review of Systems:  No headache, visual changes, nausea, vomiting, diarrhea, constipation, dizziness, abdominal pain, skin rash, fevers, chills, night sweats, weight loss, swollen lymph nodes, body aches, joint swelling, chest pain, shortness of breath, mood changes. POSITIVE muscle aches  Objective  There were no vitals taken for this visit.   General: No apparent distress alert and oriented x3 mood and affect normal, dressed appropriately.  HEENT: Pupils equal, extraocular movements intact  Respiratory: Patient's speak in full sentences and does not appear short of breath  Cardiovascular: No lower extremity edema, non tender, no erythema      Impression and Recommendations:

## 2022-12-21 ENCOUNTER — Ambulatory Visit: Payer: Managed Care, Other (non HMO) | Admitting: Family Medicine

## 2022-12-21 ENCOUNTER — Encounter: Payer: Self-pay | Admitting: Internal Medicine

## 2022-12-22 ENCOUNTER — Ambulatory Visit: Payer: Managed Care, Other (non HMO)

## 2022-12-27 MED ORDER — ROMOSOZUMAB-AQQG 105 MG/1.17ML ~~LOC~~ SOSY
210.0000 mg | PREFILLED_SYRINGE | Freq: Once | SUBCUTANEOUS | Status: AC
  Administered 2022-12-29: 210 mg via SUBCUTANEOUS

## 2022-12-27 NOTE — Addendum Note (Signed)
Addended by: Warden Fillers on: 12/27/2022 11:04 AM   Modules accepted: Orders

## 2022-12-28 ENCOUNTER — Other Ambulatory Visit: Payer: Self-pay | Admitting: Internal Medicine

## 2022-12-28 DIAGNOSIS — E871 Hypo-osmolality and hyponatremia: Secondary | ICD-10-CM

## 2022-12-29 ENCOUNTER — Ambulatory Visit (INDEPENDENT_AMBULATORY_CARE_PROVIDER_SITE_OTHER): Payer: Managed Care, Other (non HMO)

## 2022-12-29 DIAGNOSIS — E871 Hypo-osmolality and hyponatremia: Secondary | ICD-10-CM

## 2022-12-29 DIAGNOSIS — M810 Age-related osteoporosis without current pathological fracture: Secondary | ICD-10-CM

## 2022-12-29 MED ORDER — ROMOSOZUMAB-AQQG 105 MG/1.17ML ~~LOC~~ SOSY: 210.0000 mg | PREFILLED_SYRINGE | Freq: Once | SUBCUTANEOUS | Status: DC

## 2022-12-29 NOTE — Progress Notes (Signed)
Patient presented for Evenity injection to left arm and also to the right arm, patient voiced no concerns nor showed any signs of distress during injections

## 2022-12-29 NOTE — Addendum Note (Signed)
Addended by: Warden Fillers on: 12/29/2022 04:19 PM   Modules accepted: Orders

## 2022-12-30 LAB — BASIC METABOLIC PANEL
BUN/Creatinine Ratio: 18 (ref 12–28)
BUN: 11 mg/dL (ref 8–27)
CO2: 21 mmol/L (ref 20–29)
Calcium: 8.7 mg/dL (ref 8.7–10.3)
Chloride: 103 mmol/L (ref 96–106)
Creatinine, Ser: 0.61 mg/dL (ref 0.57–1.00)
Glucose: 91 mg/dL (ref 70–99)
Potassium: 3.5 mmol/L (ref 3.5–5.2)
Sodium: 139 mmol/L (ref 134–144)
eGFR: 99 mL/min/{1.73_m2} (ref >59)

## 2023-01-02 ENCOUNTER — Ambulatory Visit: Payer: Managed Care, Other (non HMO) | Admitting: Dermatology

## 2023-01-03 ENCOUNTER — Other Ambulatory Visit: Payer: Managed Care, Other (non HMO)

## 2023-01-05 ENCOUNTER — Encounter: Payer: Self-pay | Admitting: Internal Medicine

## 2023-01-06 ENCOUNTER — Other Ambulatory Visit: Payer: Self-pay | Admitting: Internal Medicine

## 2023-01-06 MED ORDER — BUPROPION HCL ER (XL) 150 MG PO TB24: 150.0000 mg | ORAL_TABLET | Freq: Every day | ORAL | 2 refills | Status: DC

## 2023-01-08 ENCOUNTER — Encounter: Payer: Self-pay | Admitting: Internal Medicine

## 2023-01-08 ENCOUNTER — Other Ambulatory Visit (HOSPITAL_COMMUNITY): Payer: Self-pay

## 2023-01-09 ENCOUNTER — Ambulatory Visit: Payer: Managed Care, Other (non HMO) | Admitting: Internal Medicine

## 2023-01-09 ENCOUNTER — Encounter: Payer: Self-pay | Admitting: Internal Medicine

## 2023-01-09 VITALS — BP 120/64 | HR 71 | Ht 63.0 in | Wt 117.6 lb

## 2023-01-09 DIAGNOSIS — F5104 Psychophysiologic insomnia: Secondary | ICD-10-CM | POA: Diagnosis not present

## 2023-01-09 DIAGNOSIS — F419 Anxiety disorder, unspecified: Secondary | ICD-10-CM | POA: Diagnosis not present

## 2023-01-09 DIAGNOSIS — G894 Chronic pain syndrome: Secondary | ICD-10-CM | POA: Diagnosis not present

## 2023-01-09 DIAGNOSIS — E871 Hypo-osmolality and hyponatremia: Secondary | ICD-10-CM

## 2023-01-09 DIAGNOSIS — Z23 Encounter for immunization: Secondary | ICD-10-CM

## 2023-01-09 DIAGNOSIS — F32A Depression, unspecified: Secondary | ICD-10-CM

## 2023-01-09 MED ORDER — BUPROPION HCL ER (XL) 150 MG PO TB24: 150.0000 mg | ORAL_TABLET | Freq: Every day | ORAL | 1 refills | Status: DC

## 2023-01-09 MED ORDER — FAMCICLOVIR 500 MG PO TABS: 1000.0000 mg | ORAL_TABLET | Freq: Every day | ORAL | 1 refills | Status: DC

## 2023-01-09 MED ORDER — ARIPIPRAZOLE 2 MG PO TABS: 2.0000 mg | ORAL_TABLET | Freq: Every day | ORAL | 2 refills | Status: DC

## 2023-01-09 MED ORDER — FAMCICLOVIR 500 MG PO TABS: 1000.0000 mg | ORAL_TABLET | Freq: Two times a day (BID) | ORAL | 1 refills | Status: DC

## 2023-01-09 NOTE — Assessment & Plan Note (Addendum)
Adrienne Robinson continues to use  pharmacologic and non pharmacologic methods to manage her arthritis pain  .  she is using a standing desk for work mode and opioids for non operable pain.  She has not had any ER visits for pain management and has not requested any early refills.  Her Refill history was confirmed via [place] Controlled Substance database by me today during her visit and there have been no prescriptions of controlled substances filled from any providers other than me. Refilling for 3 months at a time.
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
## 2023-01-09 NOTE — Assessment & Plan Note (Signed)
Currently taking wellbutrin XL 150 mg daily  ,  lexapro stopped due to acute hyponatremia.  Feels that higher does of wellbutin aggravated her anxiety. But wants to intensify her therapy for depression whichis aggravated by life stressors.   Does not want to see a psychiatrist.  Will add abilify starting dose 2 mg

## 2023-01-09 NOTE — Assessment & Plan Note (Signed)
Managed with ambien. . She is averaging 6 to 7 houtrs per night

## 2023-01-10 ENCOUNTER — Encounter: Payer: Self-pay | Admitting: Internal Medicine

## 2023-01-10 ENCOUNTER — Ambulatory Visit: Payer: Managed Care, Other (non HMO) | Admitting: Family Medicine

## 2023-01-15 NOTE — Discharge Instructions (Signed)

## 2023-01-16 ENCOUNTER — Inpatient Hospital Stay
Admission: RE | Admit: 2023-01-16 | Discharge: 2023-01-16 | Disposition: A | Discharge status: Home / Self Care | Payer: Managed Care, Other (non HMO) | Source: Ambulatory Visit | Attending: Family Medicine | Admitting: Family Medicine

## 2023-01-16 ENCOUNTER — Other Ambulatory Visit: Payer: Managed Care, Other (non HMO)

## 2023-01-17 MED ORDER — ROMOSOZUMAB-AQQG 105 MG/1.17ML ~~LOC~~ SOSY: 210.0000 mg | PREFILLED_SYRINGE | Freq: Once | SUBCUTANEOUS | Status: DC

## 2023-01-17 NOTE — Addendum Note (Signed)
Addended by: Warden Fillers on: 01/17/2023 11:17 AM   Modules accepted: Orders

## 2023-01-22 ENCOUNTER — Ambulatory Visit: Payer: Managed Care, Other (non HMO)

## 2023-01-22 ENCOUNTER — Ambulatory Visit: Payer: Managed Care, Other (non HMO) | Admitting: Dermatology

## 2023-01-23 ENCOUNTER — Ambulatory Visit: Payer: Managed Care, Other (non HMO)

## 2023-01-25 ENCOUNTER — Other Ambulatory Visit: Payer: Managed Care, Other (non HMO)

## 2023-01-29 ENCOUNTER — Ambulatory Visit: Payer: Managed Care, Other (non HMO)

## 2023-01-29 ENCOUNTER — Ambulatory Visit: Payer: Managed Care, Other (non HMO) | Admitting: Dermatology

## 2023-01-29 NOTE — Progress Notes (Unsigned)
Pt presented to have Evenity injections. Pt was identified through two identifiers. Pt tolerated injection well in the left and right arm.

## 2023-02-01 ENCOUNTER — Other Ambulatory Visit: Payer: Self-pay | Admitting: Internal Medicine

## 2023-02-05 ENCOUNTER — Ambulatory Visit: Payer: Managed Care, Other (non HMO) | Admitting: Dermatology

## 2023-02-09 NOTE — Progress Notes (Signed)
[facility] [address] [place] [address] Phone: [phone] Subjective:   Adrienne Robinson, am serving as a scribe for Dr. Antoine Primas.  I'm seeing this patient by the request  of:  Sherlene Shams, MD  CC: Multiple complaints  [id number]  11/07/2022 Significant worsening edema the need to limit back.  Discussed icing regimen and home exercises.  Discussed which activities to do and which ones to avoid.  Increase activity slowly over the course of next several weeks.  Discussed icing regimen.  Follow-up again in 6 to 8 weeks worsening pain advanced imaging would be necessary.    Repeat injection given today. Tolerated the procedure well, discussed icing regimen and home exercises, discussed which activities to do and which ones to avoid. Increase activity slowly over the course of next several weeks. Follow-up with me again in 6 to 8 weeks.   Updated 02/16/2023 Adrienne Robinson is a 67 y.o. female coming in with complaint of thumb pain. Thumb injection and OMT. Patient states that the back is giving her severe amount of pain.  Thumb is as well.  Compensating for the contralateral side secondary to the discomfort.      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   Prepatellar effusion of right knee 06/09/2019   Aspiration done June 13, 2019   Scoliosis    Spinal  stenosis of lumbar region    Squamous cell carcinoma of skin 06/24/2018   R lateral calf   Tubular adenoma of colon    Past Surgical History:  Procedure Laterality Date   ABDOMINAL HYSTERECTOMY     APPENDECTOMY     CARDIAC CATHETERIZATION     normal coronaries, no wall motion abnormalities 11/02/09 [facility])   CATARACT EXTRACTION W/PHACO Left 11/24/2020   Procedure: CATARACT EXTRACTION PHACO AND INTRAOCULAR LENS PLACEMENT (IOC) LEFT 6.09 00:46.7;  Surgeon: Lockie Mola, MD;  Location: [facility];  Service: Ophthalmology;  Laterality: Left;  patient wants early   CATARACT EXTRACTION W/PHACO Right 12/08/2020   Procedure: CATARACT EXTRACTION PHACO AND INTRAOCULAR LENS PLACEMENT (IOC) RIGHT 3.92 00:49.0;  Surgeon: Lockie Mola, MD;  Location: [facility];  Service: Ophthalmology;  Laterality: Right;  patient request early   NASAL SINUS SURGERY     OOPHORECTOMY     RADIOLOGY WITH ANESTHESIA  02/26/2012   Procedure: RADIOLOGY WITH ANESTHESIA;  Surgeon: Oneal Grout, MD;  Location: [facility] OR;  Service: Radiology;  Laterality: N/A;   REVERSE SHOULDER ARTHROPLASTY Right 06/27/2013   Procedure: REVERSE RIGHT TOTAL SHOULDER ARTHROPLASTY;  Surgeon: Verlee Rossetti, MD;  Location: [facility] OR;  Service: Orthopedics;  Laterality: Right;   SHOULDER ARTHROSCOPY WITH ROTATOR CUFF REPAIR AND SUBACROMIAL DECOMPRESSION  2007   left   TONSILLECTOMY  adnoids   Social History   Socioeconomic  History   Marital status: Married    Spouse name: Not on file   Number of children: 0   Years of education: 17   Highest education level: Not on file  Occupational History   Occupation: Nurse executive   Tobacco Use   Smoking status: Never   Smokeless tobacco: Never  Vaping Use   Vaping status: Never Used  Substance and Sexual Activity   Alcohol use: Yes    Alcohol/week: 2.0 standard drinks of alcohol    Types: 2 Glasses of wine per week    Comment: 2-3 week with dinner   Drug use: No    Sexual activity: Yes  Other Topics Concern   Not on file  Social History Narrative   Lives at home with her husband.   Right-handed.   2-3 diet cokes per day.   Social Drivers of Corporate investment banker Strain: Not on file  Food Insecurity: Not on file  Transportation Needs: Not on file  Physical Activity: Not on file  Stress: Not on file  Social Connections: Unknown (07/03/2021)   Received from [facility], [facility]   Social Network    Social Network: Not on file   Allergies  Allergen Reactions   Buspirone Other (See Comments)    Dizziness,  dry mouth    Celebrex [Celecoxib] Swelling   Adhesive [Tape] Rash and Other (See Comments)    Redness, blisters, skin peeling off.   Morphine And Codeine Rash   Family History  Problem Relation Age of Onset   Breast cancer Mother 69   Thyroid disease Mother    Colon cancer Father 15   Diabetes Father    Heart disease Father    Rectal cancer Father    Breast cancer Paternal Grandmother    Colon cancer Paternal Grandfather    Colon cancer Other        pggm   Stomach cancer Neg Hx    Esophageal cancer Neg Hx      Current Facility-Administered Medications (Endocrine & Metabolic):    Romosozumab-aqqg (EVENITY) 105 MG/1. injection 210 mg   Romosozumab-aqqg (EVENITY) 105 MG/1. injection 210 mg  Current Outpatient Medications (Cardiovascular):    amLODipine (NORVASC) 10 MG tablet, Take 1 tablet (10 mg total) by mouth daily.   furosemide (LASIX) 20 MG tablet, TAKE 1 TABLET (20 MG TOTAL) BY MOUTH DAILY WITH BREAKFAST AS NEEDED FOR EDEMA   metoprolol succinate (TOPROL-XL) 25 MG 24 hr tablet, Take 1 tablet (25 mg total) by mouth daily.   propranolol (INDERAL) 10 MG tablet, TAKE 1/2 TABLET BY MOUTH AS NEEDED FOR ANXIETY     Current Outpatient Medications (Analgesics):    HYDROcodone-acetaminophen (NORCO) 10-325 MG tablet, Take 1 tablet by mouth every 6 (six) hours as needed.   HYDROcodone-acetaminophen (NORCO)  10-325 MG tablet, Take 1 tablet by mouth every 6 (six) hours as needed for severe pain (pain score 7-10).   Current Outpatient Medications (Hematological):    cyanocobalamin (,VITAMIN B-12,) 1000 MCG/ML injection, INJECT 1 ML (1,000 MCG TOTAL) INTO THE MUSCLE ONCE A WEEK. (Patient taking differently: Inject 1,000 mcg into the muscle every 30 (thirty) days.)   Iron-Vitamin C 65-125 MG TABS, Take 1 tablet by mouth daily.   Current Outpatient Medications (Other):    ARIPiprazole (ABILIFY) 2 MG tablet, TAKE 1 TABLET BY MOUTH EVERY DAY   buPROPion (WELLBUTRIN XL) 150 MG 24 hr tablet, Take 1 tablet (150 mg total) by mouth daily.   esomeprazole (NEXIUM) 20 MG capsule,  TAKE 1 CAPSULE (20 MG TOTAL) BY MOUTH DAILY. **PLEASE CONTACT OFFICE TO SCHEDULE FOLLOW UP   famciclovir (FAMVIR) 500 MG tablet, Take 2 tablets (1,000 mg total) by mouth 2 (two) times daily.   LINZESS 145 MCG CAPS capsule, TAKE 1 CAPSULE BY MOUTH EVERY DAY BEFORE BREAKFAST   Multiple Vitamin (MULTIVITAMIN) tablet, Take 1 tablet by mouth daily.   sodium chloride 1 g tablet, Take 1 tablet (1 g total) by mouth 2 (two) times daily with a meal.   Syringe/Needle, Disp, (SYRINGE 3CC/25GX1") 25G X 1" 3 ML MISC, Use for b12 injections   zolpidem (AMBIEN CR) 12.5 MG CR tablet, TAKE 1 TABLET (12.5 MG TOTAL) BY MOUTH AT BEDTIME AS NEEDED. FOR SLEEP    Reviewed prior external information including notes and imaging from  primary care provider As well as notes that were available from care everywhere and other healthcare systems.  Past medical history, social, surgical and family history all reviewed in electronic medical record.  No pertanent information unless stated regarding to the chief complaint.   Review of Systems:  No headache, visual changes, nausea, vomiting, diarrhea, constipation, dizziness, abdominal pain, skin rash, fevers, chills, night sweats, weight loss, swollen lymph nodes, body aches, joint swelling, chest pain, shortness of  breath, mood changes. POSITIVE muscle aches  Objective  Pulse 79, height 5\' 3"  (1.6 m), weight 114 lb (51.7 kg), SpO2 97%.   General: No apparent distress alert and oriented x3 mood and affect normal, dressed appropriately.  HEENT: Pupils equal, extraocular movements intact  Respiratory: Patient's speak in full sentences and does not appear short of breath  Cardiovascular: No lower extremity edema, non tender, no erythema  Patient does have arthritic changes noted in multiple joints.  Back exam does have significant some tightness and scoliosis noted.  Patient does have multiple trigger points noted in the spinalis muscle of the lumbar spine, also latissimus dorsi on the right side around the T7 area in the T5 area rhomboid on the left side  Left thumb severe arthritic changes noted.  Positive grind test noted.  Some thenar eminence wasting.  Procedure: Real-time Ultrasound Guided Injection of left CMC joint Device: GE Logiq Q7 Ultrasound guided injection is preferred based studies that show increased duration, increased effect, greater accuracy, decreased procedural pain, increased response rate, and decreased cost with ultrasound guided versus blind injection.  Verbal informed consent obtained.  Time-out conducted.  Noted no overlying erythema, induration, or other signs of local infection.  Skin prepped in a sterile fashion.  Local anesthesia: Topical Ethyl chloride.  With sterile technique and under real time ultrasound guidance: With a 25-gauge half inch needle injected with 0.5 cc of 0.5% Marcaine and 0.5 cc of Kenalog 40 mg/mL Completed without difficulty  Pain immediately resolved suggesting accurate placement of the medication.  Advised to call if fevers/chills, erythema, induration, drainage, or persistent bleeding.  Impression: Technically successful ultrasound guided injection.  Osteopathic findings   T3 extended rotated and side bent right inhaled third rib T8 extended  rotated and side bent left L3 flexed rotated and side bent left L5 flexed rotated and side bent left Sacrum right on right    Impression and Recommendations:    Arthritis of carpometacarpal (CMC) joint of left thumb Injection given patient has had injections for over 5 years at this time.  Still wants to avoid surgical treatment.  Follow-up with me again 3 months if we need to repeat it but hopefully will get longer.  Trigger point of  thoracic region Multiple trigger points given today.  Tolerated the procedure well.  Discussed icing regimen and home exercises, and did this in the spine balance, latissimus dorsi and rhomboid musculature.  Hopefully patient will have some improvement.  Follow-up again in 6 to 8 weeks.  Spinal stenosis of lumbar region Spinal stenosis noted.  Attempted osteopathic manipulation.  Significant tightness noted we will continue to monitor.     Decision today to treat with OMT was based on Physical Exam  After verbal consent patient was treated with HVLA, ME, FPR techniques in  thoracic, rib, lumbar and sacral areas, all areas are chronic   Patient tolerated the procedure well with improvement in symptoms  Patient given exercises, stretches and lifestyle modifications  See medications in patient instructions if given  Patient will follow up in 6-8 weeks The above documentation has been reviewed and is accurate and complete Judi Saa, DO

## 2023-02-13 ENCOUNTER — Encounter: Payer: Self-pay | Admitting: Internal Medicine

## 2023-02-13 ENCOUNTER — Encounter: Payer: Self-pay | Admitting: *Deleted

## 2023-02-15 MED ORDER — HYDROCODONE-ACETAMINOPHEN 10-325 MG PO TABS: 1.0000 | ORAL_TABLET | Freq: Four times a day (QID) | ORAL | 0 refills | Status: DC | PRN

## 2023-02-16 ENCOUNTER — Encounter: Payer: Self-pay | Admitting: Family Medicine

## 2023-02-16 ENCOUNTER — Ambulatory Visit: Payer: Managed Care, Other (non HMO) | Admitting: Family Medicine

## 2023-02-16 ENCOUNTER — Other Ambulatory Visit: Payer: Self-pay

## 2023-02-16 VITALS — HR 79 | Ht 63.0 in | Wt 114.0 lb

## 2023-02-16 DIAGNOSIS — M48061 Spinal stenosis, lumbar region without neurogenic claudication: Secondary | ICD-10-CM | POA: Diagnosis not present

## 2023-02-16 DIAGNOSIS — M1812 Unilateral primary osteoarthritis of first carpometacarpal joint, left hand: Secondary | ICD-10-CM

## 2023-02-16 DIAGNOSIS — M9904 Segmental and somatic dysfunction of sacral region: Secondary | ICD-10-CM | POA: Diagnosis not present

## 2023-02-16 DIAGNOSIS — M9903 Segmental and somatic dysfunction of lumbar region: Secondary | ICD-10-CM | POA: Diagnosis not present

## 2023-02-16 DIAGNOSIS — M546 Pain in thoracic spine: Secondary | ICD-10-CM | POA: Diagnosis not present

## 2023-02-16 DIAGNOSIS — M25512 Pain in left shoulder: Secondary | ICD-10-CM | POA: Diagnosis not present

## 2023-02-16 DIAGNOSIS — M9902 Segmental and somatic dysfunction of thoracic region: Secondary | ICD-10-CM

## 2023-02-16 NOTE — Assessment & Plan Note (Signed)
Spinal stenosis noted.  Attempted osteopathic manipulation.  Significant tightness noted we will continue to monitor.

## 2023-02-16 NOTE — Assessment & Plan Note (Signed)
Multiple trigger points given today.  Tolerated the procedure well.  Discussed icing regimen and home exercises, and did this in the spine balance, latissimus dorsi and rhomboid musculature.  Hopefully patient will have some improvement.  Follow-up again in 6 to 8 weeks.

## 2023-02-16 NOTE — Patient Instructions (Addendum)
Injection in thumb  and trigger point injections today See you again in  Happy New Year

## 2023-02-16 NOTE — Assessment & Plan Note (Signed)
Injection given patient has had injections for over 5 years at this time.  Still wants to avoid surgical treatment.  Follow-up with me again 3 months if we need to repeat it but hopefully will get longer.

## 2023-02-27 ENCOUNTER — Ambulatory Visit: Payer: Managed Care, Other (non HMO)

## 2023-02-27 DIAGNOSIS — M810 Age-related osteoporosis without current pathological fracture: Secondary | ICD-10-CM

## 2023-02-27 MED ORDER — ROMOSOZUMAB-AQQG 105 MG/1.17ML ~~LOC~~ SOSY
210.0000 mg | PREFILLED_SYRINGE | Freq: Once | SUBCUTANEOUS | Status: AC
  Administered 2023-02-27: 210 mg via SUBCUTANEOUS

## 2023-02-27 NOTE — Progress Notes (Signed)
Patient presented for Evenity injection to left and right arm, patient voiced no concerns nor showed any signs of distress during injection.

## 2023-02-28 ENCOUNTER — Telehealth: Payer: Self-pay

## 2023-02-28 ENCOUNTER — Ambulatory Visit: Payer: Managed Care, Other (non HMO) | Admitting: Dermatology

## 2023-02-28 DIAGNOSIS — Z85828 Personal history of other malignant neoplasm of skin: Secondary | ICD-10-CM | POA: Diagnosis not present

## 2023-02-28 DIAGNOSIS — C44529 Squamous cell carcinoma of skin of other part of trunk: Secondary | ICD-10-CM

## 2023-02-28 DIAGNOSIS — D492 Neoplasm of unspecified behavior of bone, soft tissue, and skin: Secondary | ICD-10-CM | POA: Diagnosis not present

## 2023-02-28 DIAGNOSIS — L578 Other skin changes due to chronic exposure to nonionizing radiation: Secondary | ICD-10-CM

## 2023-02-28 DIAGNOSIS — D489 Neoplasm of uncertain behavior, unspecified: Secondary | ICD-10-CM

## 2023-02-28 DIAGNOSIS — W908XXA Exposure to other nonionizing radiation, initial encounter: Secondary | ICD-10-CM | POA: Diagnosis not present

## 2023-02-28 DIAGNOSIS — C4492 Squamous cell carcinoma of skin, unspecified: Secondary | ICD-10-CM

## 2023-02-28 HISTORY — DX: Squamous cell carcinoma of skin, unspecified: C44.92

## 2023-02-28 NOTE — Telephone Encounter (Signed)
 Pharmacy Patient Advocate Encounter  Received notification from [employer] that Prior Authorization for EVENITY has been APPROVED from 02/27/23 to 02/26/24   PA #/Case ID/Reference #: [id number] Case #: [id number]   Approved for 12 injections every 31 days.

## 2023-02-28 NOTE — Progress Notes (Signed)
   Follow-Up Visit   Subjective  Adrienne Robinson is a 68 y.o. female who presents for the following: spot at chest she noticed a 2 weeks ago that been inflamed and hasn't gone away.   The patient has spots, moles and lesions to be evaluated, some may be new or changing and the patient may have concern these could be cancer.   The following portions of the chart were reviewed this encounter and updated as appropriate: medications, allergies, medical history  Review of Systems:  No other skin or systemic complaints except as noted in HPI or Assessment and Plan.  Objective  Well appearing patient in no apparent distress; mood and affect are within normal limits.   A focused examination was performed of the following areas: Chest, left postauricular neck  Relevant exam findings are noted in the Assessment and Plan.  mid sternum 1 cm pink slight pearly papule    Assessment & Plan      ACTINIC DAMAGE - chronic, secondary to cumulative UV radiation exposure/sun exposure over time - diffuse scaly erythematous macules with underlying dyspigmentation - Recommend daily broad spectrum sunscreen SPF 30+ to sun-exposed areas, reapply every 2 hours as needed.  - Recommend staying in the shade or wearing long sleeves, sun glasses (UVA+UVB protection) and wide brim hats (4-inch brim around the entire circumference of the hat). - Call for new or changing lesions.  NEOPLASM OF UNCERTAIN BEHAVIOR mid sternum Epidermal / dermal shaving  Lesion diameter (cm):  1 Informed consent: discussed and consent obtained   Patient was prepped and draped in usual sterile fashion: Area prepped with alcohol. Anesthesia: the lesion was anesthetized in a standard fashion   Anesthetic:  1% lidocaine  w/ epinephrine  1-100,000 buffered w/ 8.4% NaHCO3 Instrument used: flexible razor blade   Hemostasis achieved with: pressure, aluminum chloride and electrodesiccation   Outcome: patient tolerated procedure well     Destruction of lesion  Destruction method: electrodesiccation and curettage   Informed consent: discussed and consent obtained   Curettage performed in three different directions: Yes   Electrodesiccation performed over the curetted area: Yes   Final wound size (cm):  1.1 Hemostasis achieved with:  pressure, aluminum chloride and electrodesiccation Outcome: patient tolerated procedure well with no complications   Post-procedure details: wound care instructions given   Additional details:  Mupirocin  ointment and Bandaid applied  Specimen 1 - Surgical pathology Differential Diagnosis: isk r/o bcc   Check Margins: No Isk r/o bcc    HISTORY OF BASAL CELL CARCINOMA OF THE SKIN Left postauricular neck 01/25/2022 treated with ED&C - No evidence of recurrence today - Recommend regular full body skin exams - Recommend daily broad spectrum sunscreen SPF 30+ to sun-exposed areas, reapply every 2 hours as needed.  - Call if any new or changing lesions are noted between office visits    Return for keep follow up as scheduled .  I, Eleanor Blush, CMA, am acting as scribe for Rexene Rattler, MD.   Documentation: I have reviewed the above documentation for accuracy and completeness, and I agree with the above.  Rexene Rattler, MD

## 2023-02-28 NOTE — Telephone Encounter (Signed)
 Marland Kitchen

## 2023-02-28 NOTE — Patient Instructions (Addendum)
 Biopsy Wound Care Instructions  Leave the original bandage on for 24 hours if possible.  If the bandage becomes soaked or soiled before that time, it is OK to remove it and examine the wound.  A small amount of post-operative bleeding is normal.  If excessive bleeding occurs, remove the bandage, place gauze over the site and apply continuous pressure (no peeking) over the area for 30 minutes. If this does not work, please call our clinic as soon as possible or page your doctor if it is after hours.   Once a day, cleanse the wound with soap and water. It is fine to shower. If a thick crust develops you may use a Q-tip dipped into dilute hydrogen peroxide (mix 1:1 with water) to dissolve it.  Hydrogen peroxide can slow the healing process, so use it only as needed.    After washing, apply petroleum jelly (Vaseline) or an antibiotic ointment if your doctor prescribed one for you, followed by a bandage.    For best healing, the wound should be covered with a layer of ointment at all times. If you are not able to keep the area covered with a bandage to hold the ointment in place, this may mean re-applying the ointment several times a day.  Continue this wound care until the wound has healed and is no longer open.   Itching and mild discomfort is normal during the healing process. However, if you develop pain or severe itching, please call our office.   If you have any discomfort, you can take Tylenol  (acetaminophen ) or ibuprofen  as directed on the bottle. (Please do not take these if you have an allergy to them or cannot take them for another reason).  Some redness, tenderness and white or yellow material in the wound is normal healing.  If the area becomes very sore and red, or develops a thick yellow-green material (pus), it may be infected; please notify us .    If you have stitches, return to clinic as directed to have the stitches removed. You will continue wound care for 2-3 days after the stitches  are removed.   Wound healing continues for up to one year following surgery. It is not unusual to experience pain in the scar from time to time during the interval.  If the pain becomes severe or the scar thickens, you should notify the office.    A slight amount of redness in a scar is expected for the first six months.  After six months, the redness will fade and the scar will soften and fade.  The color difference becomes less noticeable with time.  If there are any problems, return for a post-op surgery check at your earliest convenience.  To improve the appearance of the scar, you can use silicone scar gel, cream, or sheets (such as Mederma or Serica) every night for up to one year. These are available over the counter (without a prescription).  Please call our office at [phone] for any questions or concerns.     Electrodesiccation and Curettage ("Scrape and Burn") Wound Care Instructions  Leave the original bandage on for 24 hours if possible.  If the bandage becomes soaked or soiled before that time, it is OK to remove it and examine the wound.  A small amount of post-operative bleeding is normal.  If excessive bleeding occurs, remove the bandage, place gauze over the site and apply continuous pressure (no peeking) over the area for 30 minutes. If this does not work, please  call our clinic as soon as possible or page your doctor if it is after hours.   Once a day, cleanse the wound with soap and water. It is fine to shower. If a thick crust develops you may use a Q-tip dipped into dilute hydrogen peroxide (mix 1:1 with water) to dissolve it.  Hydrogen peroxide can slow the healing process, so use it only as needed.    After washing, apply petroleum jelly (Vaseline) or an antibiotic ointment if your doctor prescribed one for you, followed by a bandage.    For best healing, the wound should be covered with a layer of ointment at all times. If you are not able to keep the area covered  with a bandage to hold the ointment in place, this may mean re-applying the ointment several times a day.  Continue this wound care until the wound has healed and is no longer open. It may take several weeks for the wound to heal and close.  Itching and mild discomfort is normal during the healing process.  If you have any discomfort, you can take Tylenol  (acetaminophen ) or ibuprofen  as directed on the bottle. (Please do not take these if you have an allergy to them or cannot take them for another reason).  Some redness, tenderness and white or yellow material in the wound is normal healing.  If the area becomes very sore and red, or develops a thick yellow-green material (pus), it may be infected; please notify us .    Wound healing continues for up to one year following surgery. It is not unusual to experience pain in the scar from time to time during the interval.  If the pain becomes severe or the scar thickens, you should notify the office.    A slight amount of redness in a scar is expected for the first six months.  After six months, the redness will fade and the scar will soften and fade.  The color difference becomes less noticeable with time.  If there are any problems, return for a post-op surgery check at your earliest convenience.  To improve the appearance of the scar, you can use silicone scar gel, cream, or sheets (such as Mederma or Serica) every night for up to one year. These are available over the counter (without a prescription).  Please call our office at [phone] for any questions or concerns.  If You Need Anything After Your Visit  If you have any questions or concerns for your doctor, please call our main line at [phone] and press option 4 to reach your doctor's medical assistant. If no one answers, please leave a voicemail as directed and we will return your call as soon as possible. Messages left after 4 pm will be answered the following business day.   You may  also send us  a message via MyChart. We typically respond to MyChart messages within 1-2 business days.  For prescription refills, please ask your pharmacy to contact our office. Our fax number is [phone].  If you have an urgent issue when the clinic is closed that cannot wait until the next business day, you can page your doctor at the number below.    Please note that while we do our best to be available for urgent issues outside of office hours, we are not available 24/7.   If you have an urgent issue and are unable to reach us , you may choose to seek medical care at your doctor's office, retail clinic, urgent care center,  or emergency room.  If you have a medical emergency, please immediately call 911 or go to the emergency department.  Pager Numbers  - Dr. Hester: [phone]  - Dr. Jackquline: [phone]  - Dr. Claudene: [phone]   In the event of inclement weather, please call our main line at [phone] for an update on the status of any delays or closures.  Dermatology Medication Tips: Please keep the boxes that topical medications come in in order to help keep track of the instructions about where and how to use these. Pharmacies typically print the medication instructions only on the boxes and not directly on the medication tubes.   If your medication is too expensive, please contact our office at [phone] option 4 or send us  a message through MyChart.   We are unable to tell what your co-pay for medications will be in advance as this is different depending on your insurance coverage. However, we may be able to find a substitute medication at lower cost or fill out paperwork to get insurance to cover a needed medication.   If a prior authorization is required to get your medication covered by your insurance company, please allow us  1-2 business days to complete this process.  Drug prices often vary depending on where the prescription is filled and some  pharmacies may offer cheaper prices.  The website www.goodrx.com contains coupons for medications through different pharmacies. The prices here do not account for what the cost may be with help from insurance (it may be cheaper with your insurance), but the website can give you the price if you did not use any insurance.  - You can print the associated coupon and take it with your prescription to the pharmacy.  - You may also stop by our office during regular business hours and pick up a GoodRx coupon card.  - If you need your prescription sent electronically to a different pharmacy, notify our office through [facility] or by phone at [phone] option 4.     Si Usted Necesita Algo Despus de Su Visita  Tambin puede enviarnos un mensaje a travs de Clinical Cytogeneticist. Por lo general respondemos a los mensajes de MyChart en el transcurso de 1 a 2 das hbiles.  Para renovar recetas, por favor pida a su farmacia que se ponga en contacto con nuestra oficina. Randi lakes de fax es [place] [phone].  Si tiene un asunto urgente cuando la clnica est cerrada y que no puede esperar hasta el siguiente da hbil, puede llamar/localizar a su doctor(a) al nmero que aparece a continuacin.   Por favor, tenga en cuenta que aunque hacemos todo lo posible para estar disponibles para asuntos urgentes fuera del horario de [place], no estamos disponibles las 24 horas del da, los 7 [address]  [address] de la [place].   Si tiene un problema urgente y no puede comunicarse con nosotros, puede optar por buscar atencin mdica  en el consultorio de su doctor(a), en una clnica privada, en un centro de atencin urgente o en una sala de emergencias.  Si tiene engineer, drilling, por favor llame inmediatamente al 911 o vaya a la sala de emergencias.  Nmeros de bper  - Dr. Hester: [phone]  - Dra. Jackquline: [phone]  - Dr. Claudene: [phone]   En caso de inclemencias del tiempo, por favor llame a landry capes principal al [phone] para una actualizacin sobre el [place] de cualquier retraso o cierre.  Consejos para la medicacin en dermatologa: Por favor, guarde las cajas en  las que vienen los medicamentos de uso tpico para ayudarle a seguir las instrucciones sobre dnde y cmo usarlos. Las farmacias generalmente imprimen las instrucciones del medicamento slo en las cajas y no directamente en los tubos del [place].   Si su medicamento es muy caro, por favor, pngase en contacto con landry rieger llamando al [phone] y presione la opcin 4 o envenos un mensaje a travs de Clinical Cytogeneticist.   No podemos decirle cul ser su copago por los medicamentos por adelantado ya que esto es diferente dependiendo de la cobertura de su seguro. Sin embargo, es posible que podamos encontrar un medicamento sustituto a audiological scientist un formulario para que el seguro cubra el medicamento que se considera necesario.   Si se requiere una autorizacin previa para que su compaa de seguros cubra su medicamento, por favor permtanos de 1 a 2 das hbiles para completar este proceso.  Los precios de los medicamentos varan con frecuencia dependiendo del environmental consultant de dnde se surte la receta y alguna farmacias pueden ofrecer precios ms baratos.  El sitio web www.goodrx.com tiene cupones para medicamentos de health and safety inspector. Los precios aqu no tienen en cuenta lo que podra costar con la ayuda del seguro (puede ser ms barato con su seguro), pero el sitio web puede darle el precio si no utiliz tourist information centre manager.  - Puede imprimir el cupn correspondiente y llevarlo con su receta a la farmacia.  - Tambin puede pasar por nuestra oficina durante el horario de atencin regular y education officer, museum una tarjeta de cupones de GoodRx.  - Si necesita que su receta se enve electrnicamente a una farmacia diferente, informe a nuestra oficina a travs de MyChart de [place] o por telfono llamando al [phone] y presione la  opcin 4.

## 2023-03-05 LAB — SURGICAL PATHOLOGY

## 2023-03-05 NOTE — Telephone Encounter (Signed)
 Pt has two insurances. She also has medicare

## 2023-03-05 NOTE — Telephone Encounter (Signed)
 Pt ready for scheduling for EVENITY  on or after : 02/27/23  Out-of-pocket cost due at time of visit: $494 + DEDUCTIBLE  Number of injection/visits approved: 12  Primary: [employer] [place] co-insurance: 20% Admin fee co-insurance: 20%  Secondary: --- [place] co-insurance:  Admin fee co-insurance:   Medical Benefit Details: Date Benefits were checked: 02/26/23 Deductible: $0 Met of $800 Required/ Coinsurance: 20%/ Admin Fee: 20%  Prior Auth: APPROVED PA# [id number] Expiration Date: 02/27/23-02/26/24 # of doses approved: 12 INJECTIONS EVERY 31 DAYS  Pharmacy benefit: Copay $--- If patient wants fill through the pharmacy benefit please send prescription to:  --- , and include estimated need by date in rx notes. Pharmacy will ship medication directly to the office.  Patient IS eligible for Evenity  Copay Card. Copay Card can make patient's cost as little as $25. Link to apply: https://www.amgensupportplus.com/copay  ** This summary of benefits is an estimation of the patient's out-of-pocket cost. Exact cost may very based on individual plan coverage.

## 2023-03-06 ENCOUNTER — Telehealth: Payer: Self-pay

## 2023-03-06 ENCOUNTER — Other Ambulatory Visit (HOSPITAL_COMMUNITY): Payer: Self-pay

## 2023-03-06 NOTE — Telephone Encounter (Signed)
 Only found [employer]. Please upload or get additional insurance information from patient so I can submit in [employer].

## 2023-03-06 NOTE — Telephone Encounter (Signed)
-----   Message from Rexene Rattler sent at 03/05/2023  7:40 PM EST ----- 1. Skin, mid sternum :       POORLY DIFFERENTIATED SQUAMOUS CELL CARCINOMA   SCC skin cancer- already treated with EDC at time of biopsy, but because it is poorly differentiated, it has a a higher risk of recurrence so will need to recheck it in 2-3 months.  If it recurs, she will need surgical excision.   - please call patient

## 2023-03-06 NOTE — Telephone Encounter (Signed)
 Advised pt of bx results/sh ?

## 2023-03-09 NOTE — Telephone Encounter (Signed)
I hasn't changed, found this info in her chart.  E-MEDICARE/MEDICARE PART A [id number] 11/20/2021

## 2023-03-13 NOTE — Telephone Encounter (Signed)
Evenity VOB initiated via [email or web address]

## 2023-03-16 ENCOUNTER — Encounter: Payer: Self-pay | Admitting: Internal Medicine

## 2023-03-16 MED ORDER — ROMOSOZUMAB-AQQG 105 MG/1.17ML ~~LOC~~ SOSY
210.0000 mg | PREFILLED_SYRINGE | Freq: Once | SUBCUTANEOUS | Status: AC
  Administered 2023-04-05: 210 mg via SUBCUTANEOUS

## 2023-03-16 NOTE — Addendum Note (Signed)
Addended by: Warden Fillers on: 03/16/2023 11:51 AM   Modules accepted: Orders

## 2023-03-19 ENCOUNTER — Ambulatory Visit: Payer: Managed Care, Other (non HMO) | Admitting: Dermatology

## 2023-03-19 NOTE — Telephone Encounter (Signed)
I see both a nephrology and endocrinology referral in the chart.

## 2023-03-20 ENCOUNTER — Other Ambulatory Visit: Payer: Self-pay | Admitting: Internal Medicine

## 2023-03-20 ENCOUNTER — Telehealth: Payer: Self-pay

## 2023-03-20 MED ORDER — VALACYCLOVIR HCL 500 MG PO TABS: 500.0000 mg | ORAL_TABLET | Freq: Two times a day (BID) | ORAL | 1 refills | Status: AC

## 2023-03-20 NOTE — Telephone Encounter (Signed)
Discussed prophylactic valtrex for upcoming appointment.  Sent in Valtrex 500mg  1 po bid for 7 days starting 1 day prior to procedure.

## 2023-03-23 MED ORDER — HYDROCODONE-ACETAMINOPHEN 10-325 MG PO TABS: 1.0000 | ORAL_TABLET | Freq: Four times a day (QID) | ORAL | 0 refills | Status: DC | PRN

## 2023-03-23 NOTE — Addendum Note (Signed)
Addended by: Sherlene Shams on: 03/23/2023 05:43 PM   Modules accepted: Orders

## 2023-03-26 ENCOUNTER — Ambulatory Visit (INDEPENDENT_AMBULATORY_CARE_PROVIDER_SITE_OTHER): Payer: Self-pay | Admitting: Dermatology

## 2023-03-26 ENCOUNTER — Encounter: Payer: Self-pay | Admitting: Dermatology

## 2023-03-26 DIAGNOSIS — L988 Other specified disorders of the skin and subcutaneous tissue: Secondary | ICD-10-CM

## 2023-03-26 NOTE — Progress Notes (Signed)
   Follow-Up Visit   Subjective  Adrienne Robinson is a 68 y.o. female who presents for the following: Botox for facial elastosis and fillers  The following portions of the chart were reviewed this encounter and updated as appropriate: medications, allergies, medical history  Review of Systems:  No other skin or systemic complaints except as noted in HPI or Assessment and Plan.  Objective  Well appearing patient in no apparent distress; mood and affect are within normal limits.  A focused examination was performed of the face.  Relevant physical exam findings are noted in the Assessment and Plan.  Before photos             After photos                 Injection map photo     Assessment & Plan    Facial Elastosis Botox 10 units injected today to - Upper and lower lips 10 units total, no charge today (last treatment had no effect so repeating today at no charge)  Location: upper and lower lips  Informed consent: Discussed risks (infection, pain, bleeding, bruising, swelling, allergic reaction, paralysis of nearby muscles, eyelid droop, double vision, neck weakness, difficulty breathing, headache, undesirable cosmetic result, and need for additional treatment) and benefits of the procedure, as well as the alternatives.  Informed consent was obtained.  Preparation: The area was cleansed with alcohol.  Procedure Details:  Botox was injected into the dermis with a 30-gauge needle. Pressure applied to any bleeding. Ice packs offered for swelling.  Lot Number:  [id number] Expiration:  11/26  Total Units Injected:  10  Plan: Tylenol may be used for headache.  Allow 2 weeks before returning to clinic for additional dosing as needed. Patient will call for any problems.  Prior to the procedure, the patient's past medical history, allergies and the rare but potential risks and complications were reviewed with the patient and a signed consent was obtained. Pre  and post-treatment care was discussed and instructions provided.    FILLER INJECTION  Location: lips- upper lip vermilion and cutaneous upper lip vertical wrinkles  Filler Type: Restylane refyne Lot [id number] exp 04/19/2024  Procedure: Lidocaine-tetracaine ointment was applied to treatment areas to achieve good local anesthesia. The area was prepped thoroughly with Puracyn. After introducing the needle into the desired treatment area, the syringe plunger was drawn back to ensure there was no flash of blood prior to injecting the filler in order to minimize risk of intravascular injection and vascular occlusion. After injection of the filler, the treated areas were cleansed and iced to reduce swelling. Post-treatment instructions were reviewed with the patient.       Patient tolerated the procedure well. The patient will call with any problems, questions or concerns prior to their next appointment.    Return in about 2 weeks (around 04/09/2023) for Botox f/u.  I, Ardis Rowan, RMA, am acting as scribe for Willeen Niece, MD .   Documentation: I have reviewed the above documentation for accuracy and completeness, and I agree with the above.  Willeen Niece, MD

## 2023-03-26 NOTE — Patient Instructions (Signed)

## 2023-03-27 ENCOUNTER — Ambulatory Visit: Payer: Managed Care, Other (non HMO)

## 2023-03-27 NOTE — Progress Notes (Deleted)
 [facility] [address] [place] [address] Phone: [phone] Subjective:    I'm seeing this patient by the request  of:  Marylynn Verneita CROME, MD  CC:   YEP:[id number]  Adrienne Robinson is a 68 y.o. female coming in with complaint of back and neck pain. OMT on 02/16/2023. Given trigger point injections and follow up on wrist pain. Patient states   Medications patient has been prescribed:   Taking:         Reviewed prior external information including notes and imaging from previsou exam, outside providers and external EMR if available.   As well as notes that were available from care everywhere and other healthcare systems.  Past medical history, social, surgical and family history all reviewed in electronic medical record.  No pertanent information unless stated regarding to the chief complaint.   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   Morphine And Codeine Rash     Review of Systems:  No headache, visual changes, nausea, vomiting, diarrhea, constipation, dizziness, abdominal pain, skin rash, fevers, chills, night sweats, weight loss, swollen lymph nodes, body aches, joint swelling, chest pain, shortness of breath, mood changes. POSITIVE muscle aches  Objective  There were no vitals taken for this visit.   General: No apparent distress alert and oriented x3 mood and affect normal, dressed appropriately.  HEENT: Pupils equal, extraocular movements intact  Respiratory: Patient's speak in full sentences and does not appear short of breath  Cardiovascular: No lower extremity edema, non tender, no erythema  Gait MSK:  Back   Osteopathic findings  C2 flexed rotated and side bent right C6 flexed rotated and side bent left T3 extended rotated and side bent right inhaled rib T9 extended rotated and side bent left L2 flexed rotated and side bent right Sacrum right on right       Assessment and Plan:  No problem-specific Assessment & Plan notes found for this encounter.    Nonallopathic problems  Decision today to treat with OMT was based on Physical Exam  After verbal consent patient was treated with HVLA, ME, FPR techniques in cervical, rib, thoracic, lumbar, and sacral  areas  Patient tolerated the procedure  well with improvement in symptoms  Patient given exercises, stretches and lifestyle modifications  See medications in patient instructions if given  Patient will follow up in 4-8 weeks     The above documentation has been reviewed and is accurate and complete Suri Tafolla M [name], DO         Note: This dictation was prepared with Dragon dictation along with smaller phrase technology. Any transcriptional errors that result from this process are unintentional.

## 2023-03-28 ENCOUNTER — Ambulatory Visit: Payer: Managed Care, Other (non HMO)

## 2023-03-30 ENCOUNTER — Ambulatory Visit: Payer: Managed Care, Other (non HMO) | Admitting: Family Medicine

## 2023-04-05 ENCOUNTER — Ambulatory Visit (INDEPENDENT_AMBULATORY_CARE_PROVIDER_SITE_OTHER): Payer: Managed Care, Other (non HMO)

## 2023-04-05 DIAGNOSIS — M810 Age-related osteoporosis without current pathological fracture: Secondary | ICD-10-CM | POA: Diagnosis not present

## 2023-04-05 DIAGNOSIS — M19011 Primary osteoarthritis, right shoulder: Secondary | ICD-10-CM

## 2023-04-05 MED ORDER — ROMOSOZUMAB-AQQG 105 MG/1.17ML ~~LOC~~ SOSY: 210.0000 mg | PREFILLED_SYRINGE | Freq: Once | SUBCUTANEOUS | Status: DC

## 2023-04-05 NOTE — Telephone Encounter (Signed)
Pt ready for scheduling for EVENITY on or after : 05/03/23   Out-of-pocket cost due at time of visit: $494 + DEDUCTIBLE   Number of injection/visits approved: 12   Primary: [employer] Prolia co-insurance: 20% Admin fee co-insurance: 20%   Secondary: MEDICARE Prolia co-insurance:  [facility] has validated that this patient in fact only has an active Medicare Part A plan. No Part B coverage available. Evenity is not a covered service. Admin fee co-insurance:    Medical Benefit Details: Date Benefits were checked: 02/26/23 Deductible: $136.44 Met of $800 Required/ Coinsurance: 20%/ Admin Fee: 20%   Prior Auth: APPROVED PA# [id number] Expiration Date: 02/27/23-02/26/24 # of doses approved: 12 INJECTIONS EVERY 31 DAYS  Pharmacy benefit: Copay $--- If patient wants fill through the pharmacy benefit please send prescription to:  --- , and include estimated need by date in rx notes. Pharmacy will ship medication directly to the office.   Patient IS eligible for Evenity Copay Card. Copay Card can make patient's cost as little as $25. Link to apply: [email or web address]://[email or web address]   ** This summary of benefits is an estimation of the patient's out-of-pocket cost. Exact cost may very based on individual plan coverage.

## 2023-04-05 NOTE — Progress Notes (Signed)
Pt presented today for Envenity injection. Pt received an injection in both left and right arm, SQ. Pt voiced no concerns nor showed any signs of distress during injection.

## 2023-04-05 NOTE — Telephone Encounter (Signed)
COVERAGE DETAILS: [facility] has validated that this patient in fact only has an active  Medicare Part A plan. No Part B coverage available. Evenity is not a covered service.

## 2023-04-09 ENCOUNTER — Ambulatory Visit (INDEPENDENT_AMBULATORY_CARE_PROVIDER_SITE_OTHER): Payer: Managed Care, Other (non HMO) | Admitting: Dermatology

## 2023-04-09 DIAGNOSIS — L988 Other specified disorders of the skin and subcutaneous tissue: Secondary | ICD-10-CM

## 2023-04-09 NOTE — Progress Notes (Signed)
   Follow-Up Visit   Subjective  Adrienne Robinson is a 68 y.o. female who presents for the following: facial elastosis, patient here today for Botox follow up. S/P 10 units total to the upper and lower lip on 03/26/23.   The following portions of the chart were reviewed this encounter and updated as appropriate: medications, allergies, medical history  Review of Systems:  No other skin or systemic complaints except as noted in HPI or Assessment and Plan.  Objective  Well appearing patient in no apparent distress; mood and affect are within normal limits.  A focused examination was performed of the following areas: the face   Relevant exam findings are noted in the Assessment and Plan.    Assessment & Plan   FACIAL ELASTOSIS       Exam: Rhytides and volume loss.  Treatment Plan: Improvement compared to baseline photos.   Discussed fillers around the mouth typically last about 6 mths, and Botox can last between 4 weeks and 8 weeks around mouth  Recommend daily broad spectrum sunscreen SPF 30+ to sun-exposed areas, reapply every 2 hours as needed. Call for new or changing lesions.  Staying in the shade or wearing long sleeves, sun glasses (UVA+UVB protection) and wide brim hats (4-inch brim around the entire circumference of the hat) are also recommended for sun protection.    Return for appointment as scheduled.  Maylene Roes, CMA, am acting as scribe for Willeen Niece, MD .   Documentation: I have reviewed the above documentation for accuracy and completeness, and I agree with the above.  Willeen Niece, MD

## 2023-04-09 NOTE — Patient Instructions (Signed)

## 2023-04-13 ENCOUNTER — Ambulatory Visit (INDEPENDENT_AMBULATORY_CARE_PROVIDER_SITE_OTHER): Payer: Managed Care, Other (non HMO) | Admitting: Internal Medicine

## 2023-04-13 ENCOUNTER — Encounter: Payer: Self-pay | Admitting: *Deleted

## 2023-04-13 ENCOUNTER — Encounter: Payer: Self-pay | Admitting: Internal Medicine

## 2023-04-13 VITALS — BP 138/66 | HR 65 | Ht 63.0 in | Wt 116.8 lb

## 2023-04-13 DIAGNOSIS — I10 Essential (primary) hypertension: Secondary | ICD-10-CM

## 2023-04-13 DIAGNOSIS — M48062 Spinal stenosis, lumbar region with neurogenic claudication: Secondary | ICD-10-CM | POA: Diagnosis not present

## 2023-04-13 DIAGNOSIS — G5611 Other lesions of median nerve, right upper limb: Secondary | ICD-10-CM

## 2023-04-13 DIAGNOSIS — M1812 Unilateral primary osteoarthritis of first carpometacarpal joint, left hand: Secondary | ICD-10-CM | POA: Diagnosis not present

## 2023-04-13 DIAGNOSIS — E871 Hypo-osmolality and hyponatremia: Secondary | ICD-10-CM

## 2023-04-13 MED ORDER — HYDROCODONE-ACETAMINOPHEN 10-325 MG PO TABS: 1.0000 | ORAL_TABLET | Freq: Four times a day (QID) | ORAL | 0 refills | Status: DC | PRN

## 2023-04-13 MED ORDER — CELECOXIB 200 MG PO CAPS: 200.0000 mg | ORAL_CAPSULE | Freq: Two times a day (BID) | ORAL | 0 refills | Status: DC

## 2023-04-13 MED ORDER — METOPROLOL SUCCINATE ER 25 MG PO TB24: 25.0000 mg | ORAL_TABLET | Freq: Every day | ORAL | 1 refills | Status: DC

## 2023-04-13 MED ORDER — FUROSEMIDE 20 MG PO TABS: 20.0000 mg | ORAL_TABLET | Freq: Every day | ORAL | 1 refills | Status: AC

## 2023-04-13 MED ORDER — PREDNISONE 10 MG PO TABS: ORAL_TABLET | ORAL | 0 refills | Status: DC

## 2023-04-13 NOTE — Patient Instructions (Signed)
 Prednisone taper for one week  Repeat trial of celebrex 200 mg once or twice daily

## 2023-04-13 NOTE — Progress Notes (Signed)
 Subjective:  Patient ID: Adrienne Robinson, female    DOB: 1955/09/15  Age: 68 y.o. MRN: [id number]  CC: The primary encounter diagnosis was Hypertension, unspecified type. Diagnoses of Arthritis of carpometacarpal (CMC) joint of left thumb, Median nerve dysfunction, right, Spinal stenosis of lumbar region with neurogenic claudication, and Hyponatremia were also pertinent to this visit.   HPI Adrienne Robinson presents for  Chief Complaint  Patient presents with   Medical Management of Chronic Issues    3 month follow up    1) she sustained a  right wrist fracture  several months ago  when her husband Gery Pray lost his balance and fell on her.  Her fracture  required prlonging  casting and splinting followed by  surgery  which involved use of a T plate and 8 screws.   She is currently wearing a brace  at night ONLY on right wrist,.  She has new onset numbness of the  3rd and 4th fingers  for the past  1.5 weeks .   Symptoms were not  present at 1/29 follow up with and surgeon (NAPPO )at [facility]    2) Chronic pain management.  Post operatively she was prescribed  oxycodone by her orthopedic surgeon ;  but has resumed use of hydrocodone Refill history confirmed via [place] Controlled Substance databas, accessed by me today..   She continues to receive therapy from Terrilee Files and sees him next week for injection of her left thumb base  as well as planned  pressure point injection  of the left scapular /thoracic spine .   3) hyponatremia:  resolved by most recent testing.  Taking sodium tablets.  Prior episodes attributed to prolonged use of steroids  4) Hypertension: patient checks blood pressure twice weekly at home.  Readings have been for the most part <130/80 at rest . Patient is following a reduced salt diet most days and is taking medications as prescribed   5) Depression;  symptoms have ben more manageable since addition of abilify to lower dose wellbutrin   Outpatient Medications Prior to  Visit  Medication Sig Dispense Refill   amLODipine (NORVASC) 10 MG tablet Take 1 tablet (10 mg total) by mouth daily. 90 tablet 3   ARIPiprazole (ABILIFY) 2 MG tablet TAKE 1 TABLET BY MOUTH EVERY DAY 90 tablet 1   buPROPion (WELLBUTRIN XL) 150 MG 24 hr tablet Take 1 tablet (150 mg total) by mouth daily. 90 tablet 1   cyanocobalamin (,VITAMIN B-12,) 1000 MCG/ML injection INJECT 1 ML (1,000 MCG TOTAL) INTO THE MUSCLE ONCE A WEEK. (Patient taking differently: Inject 1,000 mcg into the muscle every 30 (thirty) days.) 12 mL 6   esomeprazole (NEXIUM) 20 MG capsule TAKE 1 CAPSULE (20 MG TOTAL) BY MOUTH DAILY. **PLEASE CONTACT OFFICE TO SCHEDULE FOLLOW UP 90 capsule 0   famciclovir (FAMVIR) 500 MG tablet Take 2 tablets (1,000 mg total) by mouth 2 (two) times daily. 4 tablet 1   HYDROcodone-acetaminophen (NORCO) 10-325 MG tablet Take 1 tablet by mouth every 6 (six) hours as needed. 120 tablet 0   Iron-Vitamin C 65-125 MG TABS Take 1 tablet by mouth daily. 90 tablet 1   LINZESS 145 MCG CAPS capsule TAKE 1 CAPSULE BY MOUTH EVERY DAY BEFORE BREAKFAST 90 capsule 0   Multiple Vitamin (MULTIVITAMIN) tablet Take 1 tablet by mouth daily.     propranolol (INDERAL) 10 MG tablet TAKE 1/2 TABLET BY MOUTH AS NEEDED FOR ANXIETY 15 tablet 1   sodium chloride  1 g tablet Take 1 tablet (1 g total) by mouth 2 (two) times daily with a meal. 60 tablet 2   Syringe/Needle, Disp, (SYRINGE 3CC/25GX1") 25G X 1" 3 ML MISC Use for b12 injections 50 each 0   zolpidem (AMBIEN CR) 12.5 MG CR tablet TAKE 1 TABLET (12.5 MG TOTAL) BY MOUTH AT BEDTIME AS NEEDED. FOR SLEEP 30 tablet 5   furosemide (LASIX) 20 MG tablet TAKE 1 TABLET (20 MG TOTAL) BY MOUTH DAILY WITH BREAKFAST AS NEEDED FOR EDEMA 90 tablet 1   metoprolol succinate (TOPROL-XL) 25 MG 24 hr tablet Take 1 tablet (25 mg total) by mouth daily. 90 tablet 1   HYDROcodone-acetaminophen (NORCO) 10-325 MG tablet Take 1 tablet by mouth every 6 (six) hours as needed for severe pain (pain  score 7-10). 120 tablet 0   Facility-Administered Medications Prior to Visit  Medication Dose Route Frequency Provider Last Rate Last Admin   Romosozumab-aqqg (EVENITY) 105 MG/1. injection 210 mg  210 mg Subcutaneous Once Sherlene Shams, MD       Romosozumab-aqqg (EVENITY) 105 MG/1. injection 210 mg  210 mg Subcutaneous Once Sherlene Shams, MD       [START ON 05/03/2023] Romosozumab-aqqg (EVENITY) 105 MG/1. injection 210 mg  210 mg Subcutaneous Once Sherlene Shams, MD        Review of Systems;  Patient denies headache, fevers, malaise, unintentional weight loss, skin rash, eye pain, sinus congestion and sinus pain, sore throat, dysphagia,  hemoptysis , cough, dyspnea, wheezing, chest pain, palpitations, orthopnea, edema, abdominal pain, nausea, melena, diarrhea, constipation, flank pain, dysuria, hematuria, urinary  Frequency, nocturia, numbness, tingling, seizures,  Focal weakness, Loss of consciousness,  Tremor, insomnia, depression, anxiety, and suicidal ideation.      Objective:  BP 138/66   Pulse 65   Ht 5\' 3"  (1.6 m)   Wt 116 lb 12.8 oz (53 kg)   SpO2 99%   BMI 20.69 kg/m   BP Readings from Last 3 Encounters:  04/13/23 138/66  01/09/23 120/64  12/13/22 114/72    Wt Readings from Last 3 Encounters:  04/13/23 116 lb 12.8 oz (53 kg)  02/16/23 114 lb (51.7 kg)  01/09/23 117 lb 9.6 oz (53.3 kg)    Physical Exam  Lab Results  Component Value Date   HGBA1C 5.7 08/04/2011    Lab Results  Component Value Date   CREATININE 1.00 04/13/2023   CREATININE 0.61 12/29/2022   CREATININE 0.62 12/12/2022    Lab Results  Component Value Date   WBC 10.2 11/19/2022   HGB 11.3 (L) 11/19/2022   HCT 32.4 (L) 11/19/2022   PLT 265 11/19/2022   GLUCOSE 95 04/13/2023   CHOL 206 (H) 11/04/2021   TRIG 170 (H) 11/04/2021   HDL 57 11/04/2021   LDLDIRECT 81.0 01/22/2020   LDLCALC 120 (H) 11/04/2021   ALT 20 12/01/2022   AST 21 12/01/2022   NA 135 04/13/2023   K 3.8  04/13/2023   CL 101 04/13/2023   CREATININE 1.00 04/13/2023   BUN 15 04/13/2023   CO2 22 04/13/2023   TSH 0.979 01/16/2022   INR 1.0 07/12/2020   HGBA1C 5.7 08/04/2011   MICROALBUR <0.7 06/07/2021    No results found.  Assessment & Plan:  .Hypertension, unspecified type -     Basic metabolic panel  Arthritis of carpometacarpal [facility]) joint of left thumb Assessment & Plan: Managed with I/A Injections by sports medicine. She prefers to avoid surgery given her increased responsibilities at home.  Median nerve dysfunction, right Assessment & Plan: Suspected ny new onset numbness of fingers 3 and 4 in the setting of recent wrist fracture and surgical pinning.   6 day prednisone taper trial.    Spinal stenosis of lumbar region with neurogenic claudication Assessment & Plan: With chronic low back pain managed non operatively with ESI , manipulation , and opioids. Refill history confirmed via [place] Controlled Substance database accessed by me today.. there have been no oxycodone prescriptions filled in the last 2 months ,  and she has been advised that failure to notify me in the future of concurrent opioid prescriptions by other providers will result in termination of pain management by me.  I am .refilling hydrocodone for #120/month    Hyponatremia Assessment & Plan: Recurrence  was attributed to use of Lexapro.  Medication was stopped. She is using sodium supplementation daiy;  current level is normal   pre steroid taper   Lab Results  Component Value Date   NA 135 04/13/2023   K 3.8 04/13/2023   CL 101 04/13/2023   CO2 22 04/13/2023      Other orders -     Furosemide; Take 1 tablet (20 mg total) by mouth daily with breakfast. As needed for edema  Dispense: 90 tablet; Refill: 1 -     Metoprolol Succinate ER; Take 1 tablet (25 mg total) by mouth daily.  Dispense: 90 tablet; Refill: 1 -     predniSONE; 6 tablets on Day 1 , then reduce by 1 tablet daily until gone  Dispense: 21  tablet; Refill: 0 -     Celecoxib; Take 1 capsule (200 mg total) by mouth 2 (two) times daily.  Dispense: 60 capsule; Refill: 0 -     HYDROcodone-Acetaminophen; Take 1 tablet by mouth every 6 (six) hours as needed for severe pain (pain score 7-10).  Dispense: 120 tablet; Refill: 0     I spent 34 minutes on the day of this face to face encounter reviewing patient's  most recent visit with orthopedics,  sports medicine ,  prior relevant surgical and non surgical procedures, recent  labs and imaging studies, counseling on opioid use , reviewing the assessment and plan with patient, and post visit ordering and reviewing of  diagnostics and therapeutics with patient  .   Follow-up: Return in about 3 months (around 07/11/2023).   Sherlene Shams, MD

## 2023-04-13 NOTE — Progress Notes (Deleted)
 [name] [address] [place] [address] Phone: [phone] Subjective:    I'm seeing this patient by the request  of:  Sherlene Shams, MD  CC: Back and neck pain follow-up  [id number]  Adrienne Robinson is a 68 y.o. female coming in with complaint of back and neck pain. OMT on 02/16/2023. Also seen for thumb pain. Patient states   Medications patient has been prescribed:   Taking:     From PCP ) Chronic pain management.  Post operatively she was prescribed  oxycodone by her orthopedic surgeon ;  but has resumed use of hydrocodone Refill history confirmed via [place] Controlled Substance databas, accessed by me today..   She continues to receive therapy from Terrilee Files and sees him next week for injection of her left thumb base  as well as planned  pressure point injection  of the left scapular /thoracic spine .    Reviewed prior external information including notes and imaging from previsou exam, outside providers and external EMR if available.   As well as notes that were available from care everywhere and other healthcare systems.  Past medical history, social, surgical and family history all reviewed in electronic medical record.  No pertanent information unless stated regarding to the chief complaint.   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   Morphine And Codeine Rash     Review of Systems:  No headache, visual changes, nausea, vomiting, diarrhea, constipation, dizziness, abdominal pain, skin rash, fevers, chills, night sweats, weight loss, swollen lymph nodes, body aches, joint swelling, chest pain, shortness of breath, mood changes. POSITIVE muscle aches  Objective  There were no vitals taken for this visit.   General: No apparent distress alert and oriented x3 mood and affect normal, dressed appropriately.  HEENT: Pupils equal, extraocular movements intact  Respiratory: Patient's speak in full sentences and does not appear short of breath  Cardiovascular: No lower extremity edema, non tender, no erythema  Gait MSK:  Back   Osteopathic findings  C2 flexed rotated and side bent right C6 flexed rotated and side bent left T3 extended rotated and side bent right inhaled rib T9 extended rotated and side bent  left L2 flexed rotated and side bent right Sacrum right on right     Assessment and Plan:  No problem-specific Assessment & Plan notes found for this encounter.    Nonallopathic problems  Decision today to treat with OMT was based on Physical Exam  After verbal consent patient was treated with HVLA, ME, FPR techniques in cervical, rib, thoracic, lumbar, and sacral  areas  Patient tolerated the procedure well with improvement in symptoms  Patient given exercises, stretches and lifestyle modifications  See medications in  patient instructions if given  Patient will follow up in 4-8 weeks      The above documentation has been reviewed and is accurate and complete Judi Saa, DO        Note: This dictation was prepared with Dragon dictation along with smaller phrase technology. Any transcriptional errors that result from this process are unintentional.

## 2023-04-14 ENCOUNTER — Encounter: Payer: Self-pay | Admitting: Internal Medicine

## 2023-04-14 DIAGNOSIS — G5611 Other lesions of median nerve, right upper limb: Secondary | ICD-10-CM | POA: Insufficient documentation

## 2023-04-14 LAB — BASIC METABOLIC PANEL
BUN: 15 mg/dL (ref 7–25)
CO2: 22 mmol/L (ref 20–32)
Calcium: 9.5 mg/dL (ref 8.6–10.4)
Chloride: 101 mmol/L (ref 98–110)
Creat: 1 mg/dL (ref 0.50–1.05)
Glucose, Bld: 95 mg/dL (ref 65–99)
Potassium: 3.8 mmol/L (ref 3.5–5.3)
Sodium: 135 mmol/L (ref 135–146)

## 2023-04-14 NOTE — Assessment & Plan Note (Signed)
 With chronic low back pain managed non operatively with ESI , manipulation , and opioids. Refill history confirmed via [place] Controlled Substance database accessed by me today.. there have been no oxycodone prescriptions filled in the last 2 months ,  and she has been advised that failure to notify me in the future of concurrent opioid prescriptions by other providers will result in termination of pain management by me.  I am .refilling hydrocodone for #120/month

## 2023-04-14 NOTE — Assessment & Plan Note (Signed)
 Suspected ny new onset numbness of fingers 3 and 4 in the setting of recent wrist fracture and surgical pinning.   6 day prednisone taper trial.

## 2023-04-14 NOTE — Assessment & Plan Note (Signed)
 Recurrence  was attributed to use of Lexapro.  Medication was stopped. She is using sodium supplementation daiy;  current level is normal   pre steroid taper   Lab Results  Component Value Date   NA 135 04/13/2023   K 3.8 04/13/2023   CL 101 04/13/2023   CO2 22 04/13/2023

## 2023-04-14 NOTE — Assessment & Plan Note (Signed)
 Managed with I/A Injections by sports medicine. She prefers to avoid surgery given her increased responsibilities at home.

## 2023-04-14 NOTE — Addendum Note (Signed)
 Addended by: Sherlene Shams on: 04/14/2023 10:52 AM   Modules accepted: Orders

## 2023-04-17 ENCOUNTER — Ambulatory Visit: Payer: Managed Care, Other (non HMO) | Admitting: Dermatology

## 2023-04-17 ENCOUNTER — Ambulatory Visit: Payer: Managed Care, Other (non HMO) | Admitting: Family Medicine

## 2023-04-17 ENCOUNTER — Encounter: Payer: Self-pay | Admitting: Internal Medicine

## 2023-04-18 ENCOUNTER — Ambulatory Visit: Payer: Managed Care, Other (non HMO) | Admitting: Dermatology

## 2023-04-26 NOTE — Progress Notes (Deleted)
 [facility] [address] [place] [address] Phone: [phone] Subjective:    I'm seeing this patient by the request  of:  Sherlene Shams, MD  CC: Back and neck pain follow-up  [id number]  Adrienne Robinson is a 68 y.o. female coming in with complaint of back and neck pain. OMT on 02/16/2023. Given trigger point injections and follow up on wrist pain. Patient states   Medications patient has been prescribed:   Taking:         Reviewed prior external information including notes and imaging from previsou exam, outside providers and external EMR if available.   As well as notes that were available from care everywhere and other healthcare systems.  Past medical history, social, surgical and family history all reviewed in electronic medical record.  No pertanent information unless stated regarding to the chief complaint.   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   Morphine And Codeine Rash     Review of Systems:  No headache, visual changes, nausea, vomiting, diarrhea, constipation, dizziness, abdominal pain, skin rash, fevers, chills, night sweats, weight loss, swollen lymph nodes, body aches, joint swelling, chest pain, shortness of breath, mood changes. POSITIVE muscle aches  Objective  There were no vitals taken for this visit.   General: No apparent distress alert and oriented x3 mood and affect normal, dressed appropriately.  HEENT: Pupils equal, extraocular movements intact  Respiratory: Patient's speak in full sentences and does not appear short of breath  Cardiovascular: No lower extremity edema, non tender, no erythema  Gait MSK:  Back   Osteopathic findings  C2 flexed rotated and side bent right C6 flexed rotated and side bent left T3 extended rotated and side bent right inhaled rib T9 extended rotated and side bent left L2 flexed rotated and side bent right Sacrum right on right       Assessment and Plan:  No problem-specific Assessment & Plan notes found for this encounter.    Nonallopathic problems  Decision today to treat with OMT was based on Physical Exam  After verbal consent patient was treated with HVLA, ME, FPR techniques in cervical, rib, thoracic, lumbar, and sacral  areas  Patient tolerated the procedure well with improvement in symptoms  Patient given exercises, stretches and lifestyle modifications  See medications in patient instructions if given  Patient will follow up in 4-8 weeks     The above documentation has been reviewed and is accurate and complete Judi Saa, DO         Note: This dictation was prepared with Dragon dictation along with smaller phrase technology. Any transcriptional errors that result from this process are unintentional.

## 2023-04-27 ENCOUNTER — Other Ambulatory Visit: Payer: Self-pay

## 2023-04-27 ENCOUNTER — Encounter (HOSPITAL_COMMUNITY): Payer: Self-pay

## 2023-04-27 ENCOUNTER — Other Ambulatory Visit: Payer: Self-pay | Admitting: *Deleted

## 2023-04-27 ENCOUNTER — Other Ambulatory Visit (HOSPITAL_COMMUNITY): Payer: Self-pay

## 2023-04-27 DIAGNOSIS — M810 Age-related osteoporosis without current pathological fracture: Secondary | ICD-10-CM

## 2023-04-27 MED ORDER — ROMOSOZUMAB-AQQG 105 MG/1.17ML ~~LOC~~ SOSY: 210.0000 mg | PREFILLED_SYRINGE | Freq: Once | SUBCUTANEOUS | Status: DC

## 2023-04-27 MED ORDER — ROMOSOZUMAB-AQQG 105 MG/1.17ML ~~LOC~~ SOSY
210.0000 mg | PREFILLED_SYRINGE | Freq: Once | SUBCUTANEOUS | 0 refills | Status: DC
  Filled 2023-04-27: qty 3.51, 1d supply, fill #0

## 2023-04-27 NOTE — Telephone Encounter (Signed)
 Copied from CRM [id number]. Topic: Clinical - Medication Question >> Apr 27, 2023  9:48 AM Adrienne Robinson wrote: Reason for CRM:  Patient is requesting a call back from Thurmond Butts regarding the  Evenity medication as she has an answer and needs to speak with Latoya directly,  patient states as soon as possible as she is working with her insurance as well.

## 2023-04-27 NOTE — Telephone Encounter (Signed)
 Spoke with patient-she now has a copay card for her Evenity. I have sent a Rx to [facility] pharmacy & she will call them and give them the copay card information and have them ship out her medication to our office prior to her appt next week.

## 2023-04-30 ENCOUNTER — Ambulatory Visit: Payer: Managed Care, Other (non HMO) | Admitting: Family Medicine

## 2023-04-30 ENCOUNTER — Other Ambulatory Visit: Payer: Self-pay

## 2023-04-30 MED ORDER — ROMOSOZUMAB-AQQG 105 MG/1.17ML ~~LOC~~ SOSY: 210.0000 mg | PREFILLED_SYRINGE | Freq: Once | SUBCUTANEOUS | 0 refills | Status: AC

## 2023-04-30 NOTE — Addendum Note (Signed)
 Addended by: Warden Fillers on: 04/30/2023 09:43 AM   Modules accepted: Orders

## 2023-04-30 NOTE — Progress Notes (Signed)
 [facility]. Office aware. Dis-enrolling.

## 2023-05-02 NOTE — Progress Notes (Addendum)
 [name] [address] [place] [address] Phone: [phone] Subjective:   [name], am serving as a scribe for Dr. Antoine Primas.  I'm seeing this patient by the request  of:  Sherlene Shams, MD  CC: back and neck pain follow up   [id number]  Adrienne Robinson is a 68 y.o. female coming in with complaint of back and neck pain. OMT 02/16/2023. Also f/u for L thumb. Patient states wants injection in thumb. Everything back wise about the same. Possible trigger points.  Medications patient has been prescribed: None  Taking:         Reviewed prior external information including notes and imaging from previsou exam, outside providers and external EMR if available.   As well as notes that were available from care everywhere and other healthcare systems.  Past medical history, social, surgical and family history all reviewed in electronic medical record.  No pertanent information unless stated regarding to the chief complaint.   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   Morphine And Codeine Rash     Review of Systems:  No headache, visual changes, nausea, vomiting, diarrhea, constipation, dizziness, abdominal pain, skin rash, fevers, chills, night sweats, weight loss, swollen lymph nodes, body aches, joint swelling, chest pain, shortness of breath, mood changes. POSITIVE muscle aches  Objective  Blood pressure 124/74, pulse 77, height 5\' 3"  (1.6 m), weight 124 lb (56.2 kg), SpO2 96%.   General: No apparent distress alert and oriented x3 mood and affect normal, dressed appropriately.  HEENT: Pupils equal, extraocular movements intact  Respiratory: Patient's speak in full sentences and does not appear short of breath  Cardiovascular: No lower extremity edema, non tender, no erythema  Gait relatively normal MSK:  Back significant scoliosis noted with some loss of lordosis.  Severe tenderness noted.  Multiple trigger points noted in the lumbar region.  Seems to be in the thoracic region as well.  More in the inferior rhomboids, latissimus dorsi and quadratus lumborum.  After verbal consent patient was prepped with alcohol swab and with a 25-gauge 1 inch needle injected in 4 distinct trigger points in the inferior rhomboid, latissimus dorsi, quadratus lumborum.  Total of 3 cc of 0.5% Marcaine and 1 cc of Kenalog 40 mg/mL.  Minimal blood loss.  Band-Aid placed.  Postinjection instructions given.  Osteopathic findings  C2 flexed rotated and side bent right C6 flexed rotated and side bent left T3 extended rotated and side bent right inhaled rib T9 extended rotated and side bent left L1 flexed rotated and side bent right L3 flexed rotated and side bent right Sacrum right on right   Procedure: Real-time Ultrasound Guided Injection of right CMC  joint Device: GE Logiq Q7 Ultrasound guided injection is preferred based studies that show increased duration, increased effect, greater accuracy, decreased procedural pain, increased response rate, and decreased cost with ultrasound guided versus blind injection.  Verbal informed consent obtained.  Time-out conducted.  Noted no overlying erythema, induration, or other signs of local infection.  Skin prepped in a sterile fashion.  Local anesthesia: Topical Ethyl chloride.  With sterile technique and under real time ultrasound guidance: With a 25-gauge half inch needle injected with 0.5 cc of 0.5% Marcaine and 0.5 cc of Kenalog 40 mg/mL Completed without difficulty  Pain immediately resolved suggesting accurate placement of the medication.  Advised to call if fevers/chills, erythema, induration, drainage, or persistent bleeding.  Impression: Technically successful ultrasound guided injection.  Procedure: Real-time Ultrasound Guided Injection of left CMC joint Device: GE Logiq Q7 Ultrasound guided injection is preferred based studies that show increased duration, increased effect, greater accuracy, decreased procedural pain, increased response rate, and decreased cost with ultrasound guided versus blind injection.  Verbal informed consent obtained.  Time-out conducted.  Noted no overlying erythema, induration, or other signs of local infection.  Skin prepped in a sterile fashion.  Local anesthesia: Topical Ethyl chloride.  With sterile technique and under real time ultrasound guidance: With a 25-gauge half inch needle injected with 0.5 cc of 0.5% Marcaine and 0.5 cc of Kenalog 40 mg/mL Completed without difficulty  Pain immediately resolved suggesting accurate placement of the medication.  Advised to call if fevers/chills, erythema, induration, drainage, or persistent bleeding.  Impression: Technically successful ultrasound guided injection.    Assessment and Plan:  Trigger point of  thoracic region Repeat injections given today May 03, 2023.  Had it more here as well as in the lumbar area.  We will continue to monitor.  Discussed icing regimen and home exercises, which activities to do and which ones to avoid.  Increase activity slowly otherwise.  Follow-up again in 6 to 8 weeks.  Arthritis of carpometacarpal (CMC) joint of both thumbs Chronic problem, discussed with patient again about icing regimen and home exercises.  Can repeat every 3 months if needed.  Will look into new medication for a possible peripheral sodium channel blocker that could be helpful for this individual.  Patient is still the primary caregiver for her ailing husband as well.  Follow-up again in 6 to 8 weeks.    Nonallopathic problems  Decision today to treat with OMT was based on Physical Exam  After verbal consent patient was treated with HVLA, ME, FPR techniques in cervical, rib, thoracic, lumbar, and sacral  areas  Patient tolerated the procedure well with improvement in symptoms  Patient given exercises, stretches and lifestyle modifications  See medications in patient instructions if given  Patient will follow up in 4-8 weeks    The above documentation has been reviewed and is accurate and complete Judi Saa, DO          Note: This dictation was prepared with Reubin Milan  dictation along with smaller phrase technology. Any transcriptional errors that result from this process are unintentional.

## 2023-05-03 ENCOUNTER — Ambulatory Visit: Payer: Managed Care, Other (non HMO)

## 2023-05-03 ENCOUNTER — Ambulatory Visit: Admitting: Family Medicine

## 2023-05-03 ENCOUNTER — Other Ambulatory Visit: Payer: Self-pay

## 2023-05-03 ENCOUNTER — Encounter: Payer: Self-pay | Admitting: Family Medicine

## 2023-05-03 VITALS — BP 124/74 | HR 77 | Ht 63.0 in | Wt 124.0 lb

## 2023-05-03 DIAGNOSIS — M9908 Segmental and somatic dysfunction of rib cage: Secondary | ICD-10-CM | POA: Diagnosis not present

## 2023-05-03 DIAGNOSIS — M9901 Segmental and somatic dysfunction of cervical region: Secondary | ICD-10-CM | POA: Diagnosis not present

## 2023-05-03 DIAGNOSIS — M9904 Segmental and somatic dysfunction of sacral region: Secondary | ICD-10-CM

## 2023-05-03 DIAGNOSIS — M9902 Segmental and somatic dysfunction of thoracic region: Secondary | ICD-10-CM | POA: Diagnosis not present

## 2023-05-03 DIAGNOSIS — M546 Pain in thoracic spine: Secondary | ICD-10-CM | POA: Diagnosis not present

## 2023-05-03 DIAGNOSIS — M9903 Segmental and somatic dysfunction of lumbar region: Secondary | ICD-10-CM

## 2023-05-03 DIAGNOSIS — M180 Bilateral primary osteoarthritis of first carpometacarpal joints: Secondary | ICD-10-CM

## 2023-05-03 DIAGNOSIS — M1812 Unilateral primary osteoarthritis of first carpometacarpal joint, left hand: Secondary | ICD-10-CM

## 2023-05-03 NOTE — Assessment & Plan Note (Signed)
 Chronic problem, discussed with patient again about icing regimen and home exercises.  Can repeat every 3 months if needed.  Will look into new medication for a possible peripheral sodium channel blocker that could be helpful for this individual.  Patient is still the primary caregiver for her ailing husband as well.  Follow-up again in 6 to 8 weeks.

## 2023-05-03 NOTE — Patient Instructions (Signed)
 Injection in thumbs Read on prescription See you again in 6-8 weeks

## 2023-05-03 NOTE — Assessment & Plan Note (Signed)
 Repeat injections given today May 03, 2023.  Had it more here as well as in the lumbar area.  We will continue to monitor.  Discussed icing regimen and home exercises, which activities to do and which ones to avoid.  Increase activity slowly otherwise.  Follow-up again in 6 to 8 weeks.

## 2023-05-07 ENCOUNTER — Telehealth: Payer: Self-pay

## 2023-05-07 ENCOUNTER — Other Ambulatory Visit (HOSPITAL_COMMUNITY): Payer: Self-pay

## 2023-05-07 DIAGNOSIS — M810 Age-related osteoporosis without current pathological fracture: Secondary | ICD-10-CM

## 2023-05-07 NOTE — Telephone Encounter (Signed)
 PA required for Evenity per [facility]

## 2023-05-07 NOTE — Telephone Encounter (Signed)
 Pharmacy Patient Advocate Encounter   Received notification from Patient Advice Request messages that prior authorization for Evenity is required/requested.   Insurance verification completed.   The patient is insured through [facility] .   Per test claim: PA required; PA started via CoverMyMeds. KEY [id number] . Waiting for clinical questions to populate.

## 2023-05-07 NOTE — Telephone Encounter (Signed)
 Clinical questions answered and PA submitted.

## 2023-05-10 ENCOUNTER — Other Ambulatory Visit: Payer: Self-pay | Admitting: Internal Medicine

## 2023-05-10 NOTE — Telephone Encounter (Signed)
 Pharmacy Patient Advocate Encounter  Received notification from [facility] that Prior Authorization for Evenity has been DENIED.  See denial reason below. No denial letter attached in CMM. Will attach denial letter to Media tab once received.   PA #/Case ID/Reference #: [id number]

## 2023-05-10 NOTE — Telephone Encounter (Signed)
 Pt already had a PA approval in the referral note from previous injection. Pt has been on Evenity. She was getting them in the office & now we want her to get the medication from her pharmacy. That is the only change. Will route to PCP for any additional medical history as well.

## 2023-05-11 NOTE — Telephone Encounter (Signed)
 LVM to call back to inform pt of message below. Please relay message when pt calls back  placed a call to [facility] about the approval for the Evenity to be filled at the specialty pharmacy.    It has been approved and can be filled at [facility].    Approval# [id number] Effective dates: 04/06/2023 to 02/26/2024

## 2023-05-11 NOTE — Telephone Encounter (Signed)
 Placed a call to [facility] about the approval for the Evenity to be filled at the specialty pharmacy.   It has been approved and can be filled at [facility].   Approval# [id number] Effective dates: 04/06/2023 to 02/26/2024

## 2023-05-15 ENCOUNTER — Encounter: Payer: Self-pay | Admitting: Internal Medicine

## 2023-05-15 ENCOUNTER — Ambulatory Visit: Payer: Managed Care, Other (non HMO) | Admitting: Dermatology

## 2023-05-16 MED ORDER — HYDROCODONE-ACETAMINOPHEN 10-325 MG PO TABS: 1.0000 | ORAL_TABLET | Freq: Four times a day (QID) | ORAL | 0 refills | Status: DC | PRN

## 2023-05-16 MED ORDER — HYDROCODONE-ACETAMINOPHEN 10-325 MG PO TABS
1.0000 | ORAL_TABLET | Freq: Four times a day (QID) | ORAL | 0 refills | Status: DC | PRN
Start: 2023-05-16 — End: 2023-07-11

## 2023-05-16 MED ORDER — ROMOSOZUMAB-AQQG 105 MG/1.17ML ~~LOC~~ SOSY: 210.0000 mg | PREFILLED_SYRINGE | SUBCUTANEOUS | 10 refills | Status: DC

## 2023-05-16 NOTE — Addendum Note (Signed)
 Addended by: Warden Fillers on: 05/16/2023 11:24 AM   Modules accepted: Orders

## 2023-05-16 NOTE — Addendum Note (Signed)
 Addended by: Sherlene Shams on: 05/16/2023 08:02 AM   Modules accepted: Orders

## 2023-05-17 ENCOUNTER — Encounter: Payer: Self-pay | Admitting: Internal Medicine

## 2023-05-17 DIAGNOSIS — M810 Age-related osteoporosis without current pathological fracture: Secondary | ICD-10-CM

## 2023-05-18 NOTE — Telephone Encounter (Signed)
 Not sure why she would have received a bill form [facility] because she will not be receiving the medication from them it is a different pharmacy. This is also documented in a previous mychart message between pt and Latoya.

## 2023-05-19 ENCOUNTER — Other Ambulatory Visit: Payer: Self-pay | Admitting: Internal Medicine

## 2023-05-21 ENCOUNTER — Encounter: Payer: Self-pay | Admitting: Internal Medicine

## 2023-05-21 NOTE — Assessment & Plan Note (Signed)
 She has been prescribed Evenity due to the severity of her osteoporosis with T scores of -3.3

## 2023-05-21 NOTE — Telephone Encounter (Signed)
 LOV: 04/13/23  NOV: 07/11/23

## 2023-05-22 NOTE — Telephone Encounter (Signed)
 Is there anything else that we need to do on our end?

## 2023-05-24 NOTE — Telephone Encounter (Signed)
 [facility] informed me that her insurance requires her to use [facility]. But when a PA was done it was approved but only approved for another specialty mail order pharmacy. So I sent the Rx to [facility].

## 2023-05-30 NOTE — Telephone Encounter (Signed)
 Spoke with [facility] & gave then the PA information. They will rerun her benefits & contact pt to receive her virtual copay card information. Once everything has been handle on their end, they will ship the [place] to our office.  Pt has been informed of the above information.  Pt has a copay card for Evenity as of 04/27/23.Approval# [id number] Effective dates: 04/06/2023 to 02/26/2024

## 2023-05-31 ENCOUNTER — Ambulatory Visit: Admitting: Dermatology

## 2023-06-06 ENCOUNTER — Telehealth: Payer: Self-pay | Admitting: *Deleted

## 2023-06-06 NOTE — Telephone Encounter (Signed)
 Opened in error

## 2023-06-12 NOTE — Telephone Encounter (Signed)
 Copied from CRM [id number]. Topic: General - Other >> Jun 12, 2023  2:01 PM Ovid Blow wrote: Reason for CRM: Patient stated that Latoya gives her evenity  injection... Patient just talked to pharmacy and medication will be sent over on the April 24th. Please call patient back regarding this issue [phone]

## 2023-06-13 ENCOUNTER — Encounter: Payer: Self-pay | Admitting: Internal Medicine

## 2023-06-13 MED ORDER — HYDROCODONE-ACETAMINOPHEN 10-325 MG PO TABS: 1.0000 | ORAL_TABLET | Freq: Four times a day (QID) | ORAL | 0 refills | Status: DC | PRN

## 2023-06-19 ENCOUNTER — Ambulatory Visit: Admitting: Family Medicine

## 2023-06-20 NOTE — Progress Notes (Signed)
 [name] [address] [place] [address] Phone: [phone] Subjective:   Adrienne Robinson, am serving as a scribe for Dr. Ronnell Coins.  I'm seeing this patient by the request  of:  Thersia Flax, MD  CC: Multiple complaints  [id number]  Adrienne Robinson is a 68 y.o. female coming in with complaint of back and neck pain. OMT on 05/03/2023. F/u CMC joint pain. Patient states that she would like injection in L CMC joint.    Would like trigger point injections distal to R scapula and medial to the L scapula.    Also f/u nodules in palm of R hand beneath the 3rd and 4th fingers causing fingers to go numb.    Medications patient has been prescribed:   Taking:         Reviewed prior external information including notes and imaging from previsou exam, outside providers and external EMR if available.   As well as notes that were available from care everywhere and other healthcare systems.  Past medical history, social, surgical and family history all reviewed in electronic medical record.  No pertanent information unless stated regarding to the chief complaint.   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   Morphine And Codeine Rash     Review of Systems:  No headache, visual changes, nausea, vomiting, diarrhea, constipation, dizziness, abdominal pain, skin rash, fevers, chills, night sweats, weight loss, swollen lymph nodes, body aches, joint swelling, chest pain, shortness of breath, mood changes. POSITIVE muscle aches  Objective  Blood pressure 108/72, pulse 76, height 5\' 3"  (1.6 m), weight 124 lb (56.2 kg), SpO2 98%.   General: No apparent distress alert and oriented x3 mood and affect normal, dressed appropriately.  HEENT: Pupils equal, extraocular movements intact  Respiratory: Patient's speak in full sentences and does not appear short of breath  Cardiovascular: No lower extremity edema, non tender, no erythema  Gait MSK:  Back does have some loss lordosis noted.  Some tenderness to palpation in the paraspinal musculature.  Does have some limited range of motion when given all planes.  Multiple trigger points noted in the right parascapular area.  This seems to be in  the rhomboid, trapezius, and levator scapula.  Also has some trigger points noted in the thoracic region  Osteopathic findings  C2 flexed rotated and side bent right C6 flexed rotated and side bent left T3 extended rotated and side bent right inhaled rib T9 extended rotated and side bent left L2 flexed rotated and side bent right Sacrum right on right   Severe arthritic changes noted of the left thumb.  Procedure: Real-time Ultrasound Guided Injection of left CMC joint Device: GE Logiq Q7 Ultrasound guided injection is preferred based studies that show increased  duration, increased effect, greater accuracy, decreased procedural pain, increased response rate, and decreased cost with ultrasound guided versus blind injection.  Verbal informed consent obtained.  Time-out conducted.  Noted no overlying erythema, induration, or other signs of local infection.  Skin prepped in a sterile fashion.  Local anesthesia: Topical Ethyl chloride.  With sterile technique and under real time ultrasound guidance: With a 25-gauge half inch needle injected with 0.5 cc of 0.5% Marcaine  and 0.5 cc of Kenalog  40 mL's per mL Completed without difficulty  Pain immediately resolved suggesting accurate placement of the medication.  Advised to call if fevers/chills, erythema, induration, drainage, or persistent bleeding.  Images saved Impression: Technically successful ultrasound guided injection.   After verbal consent patient was prepped with alcohol swab and with a 25-gauge half inch needle injected in multiple trigger points in the trapezius, latissimus dorsi, and rhomboid muscle of the shoulders bilaterally.  Total of 6 cc of 0.5% Marcaine  and 2 cc of Kenalog  40 mg/mL used.  Minimal blood loss.  Band-Aid placed.  Postinjection instructions given   Assessment and Plan:  Trigger point of shoulder region, left Has had difficulty in the left and the right areas.  Discussed icing regimen and home exercises, discussed which activities to do and which ones to avoid.  Patient does have a lot of stress and does think that this contributes to some of the discomfort and pain.  Follow-up again in 6 to 8 weeks otherwise.    Nonallopathic problems  Decision today to treat with OMT was based on Physical Exam  After verbal consent patient was treated with HVLA, ME, FPR techniques in cervical, rib, thoracic, lumbar, and sacral  areas  Patient tolerated the procedure well with improvement in symptoms  Patient given exercises, stretches and lifestyle modifications  See medications in  patient instructions if given  Patient will follow up in 4-8 weeks    The above documentation has been reviewed and is accurate and complete Meggan Dhaliwal M Porschea Borys, DO          Note: This dictation was prepared with Dragon dictation along with smaller phrase technology. Any transcriptional errors that result from this process are unintentional.

## 2023-06-21 ENCOUNTER — Ambulatory Visit: Admitting: Family Medicine

## 2023-06-21 ENCOUNTER — Encounter: Payer: Self-pay | Admitting: Family Medicine

## 2023-06-21 ENCOUNTER — Other Ambulatory Visit: Payer: Self-pay

## 2023-06-21 VITALS — BP 108/72 | HR 76 | Ht 63.0 in | Wt 124.0 lb

## 2023-06-21 DIAGNOSIS — M79644 Pain in right finger(s): Secondary | ICD-10-CM | POA: Diagnosis not present

## 2023-06-21 DIAGNOSIS — M9903 Segmental and somatic dysfunction of lumbar region: Secondary | ICD-10-CM

## 2023-06-21 DIAGNOSIS — M9908 Segmental and somatic dysfunction of rib cage: Secondary | ICD-10-CM

## 2023-06-21 DIAGNOSIS — M9901 Segmental and somatic dysfunction of cervical region: Secondary | ICD-10-CM

## 2023-06-21 DIAGNOSIS — M9904 Segmental and somatic dysfunction of sacral region: Secondary | ICD-10-CM

## 2023-06-21 DIAGNOSIS — M25511 Pain in right shoulder: Secondary | ICD-10-CM | POA: Diagnosis not present

## 2023-06-21 DIAGNOSIS — M9902 Segmental and somatic dysfunction of thoracic region: Secondary | ICD-10-CM

## 2023-06-21 DIAGNOSIS — M25512 Pain in left shoulder: Secondary | ICD-10-CM | POA: Diagnosis not present

## 2023-06-21 DIAGNOSIS — M79645 Pain in left finger(s): Secondary | ICD-10-CM

## 2023-06-21 DIAGNOSIS — M180 Bilateral primary osteoarthritis of first carpometacarpal joints: Secondary | ICD-10-CM

## 2023-06-21 DIAGNOSIS — M47812 Spondylosis without myelopathy or radiculopathy, cervical region: Secondary | ICD-10-CM

## 2023-06-21 NOTE — Assessment & Plan Note (Signed)
 Chronic problem with exacerbation.  Worse on the left side.  Still holding off on any type of surgical intervention.  Feels like the injections helped.  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  Will continue to monitor.  Follow-up again in 3 months

## 2023-06-21 NOTE — Patient Instructions (Addendum)
 Trigger point injections Injected thumb See me again in 10 weeks

## 2023-06-21 NOTE — Assessment & Plan Note (Signed)
 Has had difficulty in the left and the right areas.  Discussed icing regimen and home exercises, discussed which activities to do and which ones to avoid.  Patient does have a lot of stress and does think that this contributes to some of the discomfort and pain.  Follow-up again in 6 to 8 weeks otherwise.

## 2023-06-21 NOTE — Assessment & Plan Note (Signed)
 Responded relatively well to osteopathic manipulation.  Concern noted some of the radicular symptoms patient is having in the arm is secondary to a nerve impingement.  Discussed which activities to do and which ones to avoid.  Follow-up again in 6 to 8 weeks otherwise.

## 2023-07-02 ENCOUNTER — Telehealth: Payer: Self-pay

## 2023-07-02 ENCOUNTER — Ambulatory Visit: Payer: Managed Care, Other (non HMO) | Admitting: Dermatology

## 2023-07-02 NOTE — Telephone Encounter (Signed)
 Left voicemail to notify pt of message below:  I called [facility] pharmacy & for whatever reason they never finish running her insurance or her Evenity  Copay card. It was completed while I was on the phone today & the medication will be shipped overnight & received tomorrow. Shipment will come to our office. Pt should be good to receive her Evenity  injection on 07/04/23 as scheduled. No additional fee's required.

## 2023-07-02 NOTE — Telephone Encounter (Signed)
 Late Entry, see mychart message for my response

## 2023-07-02 NOTE — Telephone Encounter (Signed)
 Copied from CRM [id number]. Topic: Appointments - Appointment Scheduling >> Jul 02, 2023  9:11 AM Howard Macho wrote: Patient/patient representative is calling to schedule an appointment. Refer to attachments for appointment information.   Patient called wanting to make her evinity shot and she wanted me to let the office know [facility]) because she does not know if she has it there or not

## 2023-07-02 NOTE — Telephone Encounter (Signed)
 Patient called and stated that she prefers to further discuss with you about her evinity injection. She asked for a call back when available. Please advise.

## 2023-07-03 NOTE — Telephone Encounter (Signed)
 Tried to reach patient again, had to leave another voicemail.  Pt may either reach me by mychart or please confirm the best number to reach her on is: [phone].  Thanks

## 2023-07-03 NOTE — Telephone Encounter (Signed)
 Left message for the Patient to call the office back to make sure she got the my chart message from [facility] on 06/25/23.

## 2023-07-04 ENCOUNTER — Ambulatory Visit

## 2023-07-04 ENCOUNTER — Telehealth: Payer: Self-pay

## 2023-07-04 ENCOUNTER — Ambulatory Visit: Admitting: Dermatology

## 2023-07-04 DIAGNOSIS — E871 Hypo-osmolality and hyponatremia: Secondary | ICD-10-CM | POA: Diagnosis not present

## 2023-07-04 DIAGNOSIS — M810 Age-related osteoporosis without current pathological fracture: Secondary | ICD-10-CM

## 2023-07-04 MED ORDER — ROMOSOZUMAB-AQQG 105 MG/1.17ML ~~LOC~~ SOSY
210.0000 mg | PREFILLED_SYRINGE | Freq: Once | SUBCUTANEOUS | Status: AC
Start: 2023-07-04 — End: 2023-07-04
  Administered 2023-07-04: 210 mg via SUBCUTANEOUS

## 2023-07-04 NOTE — Addendum Note (Signed)
 Addended by: Chadwick Colonel on: 07/04/2023 09:48 AM   Modules accepted: Orders

## 2023-07-04 NOTE — Telephone Encounter (Signed)
 Pt is aware and will have done at nurse visit today.

## 2023-07-04 NOTE — Telephone Encounter (Signed)
 I have pended the lab for your approval.

## 2023-07-04 NOTE — Telephone Encounter (Signed)
 Copied from CRM [id number]. Topic: Clinical - Request for Lab/Test Order >> Jul 04, 2023  8:13 AM Adrienne Robinson wrote: Reason for CRM: Patient would like to let Dr. Madelon Robinson know that her hands and feet are tingling and would like to know if she could get blood work completed as soon as possible to check sodium. Please call [phone]

## 2023-07-04 NOTE — Progress Notes (Signed)
 Pt presented in office to get Evenity  in  both the left and right arm SQ pt showed no signs of distress during the injection

## 2023-07-05 ENCOUNTER — Emergency Department (HOSPITAL_BASED_OUTPATIENT_CLINIC_OR_DEPARTMENT_OTHER)
Admission: EM | Admit: 2023-07-05 | Discharge: 2023-07-05 | Disposition: A | Discharge status: Home / Self Care | Attending: Emergency Medicine | Admitting: Emergency Medicine

## 2023-07-05 ENCOUNTER — Encounter (HOSPITAL_BASED_OUTPATIENT_CLINIC_OR_DEPARTMENT_OTHER): Payer: Self-pay | Admitting: Emergency Medicine

## 2023-07-05 ENCOUNTER — Ambulatory Visit: Payer: Self-pay | Admitting: Internal Medicine

## 2023-07-05 ENCOUNTER — Telehealth: Payer: Self-pay

## 2023-07-05 ENCOUNTER — Other Ambulatory Visit: Payer: Self-pay

## 2023-07-05 ENCOUNTER — Ambulatory Visit: Admitting: Family Medicine

## 2023-07-05 DIAGNOSIS — D649 Anemia, unspecified: Secondary | ICD-10-CM | POA: Diagnosis not present

## 2023-07-05 DIAGNOSIS — E871 Hypo-osmolality and hyponatremia: Secondary | ICD-10-CM | POA: Insufficient documentation

## 2023-07-05 DIAGNOSIS — R799 Abnormal finding of blood chemistry, unspecified: Secondary | ICD-10-CM | POA: Diagnosis present

## 2023-07-05 DIAGNOSIS — E876 Hypokalemia: Secondary | ICD-10-CM | POA: Insufficient documentation

## 2023-07-05 DIAGNOSIS — I10 Essential (primary) hypertension: Secondary | ICD-10-CM | POA: Diagnosis not present

## 2023-07-05 DIAGNOSIS — Z79899 Other long term (current) drug therapy: Secondary | ICD-10-CM | POA: Diagnosis not present

## 2023-07-05 LAB — BASIC METABOLIC PANEL WITH GFR
BUN: 14 mg/dL (ref 6–23)
CO2: 20 meq/L (ref 19–32)
Calcium: 9.4 mg/dL (ref 8.4–10.5)
Chloride: 97 meq/L (ref 96–112)
Creatinine, Ser: 0.72 mg/dL (ref 0.40–1.20)
GFR: 86.47 mL/min (ref >60.00)
Glucose, Bld: 80 mg/dL (ref 70–99)
Potassium: 3.5 meq/L (ref 3.5–5.1)
Sodium: 128 meq/L — ABNORMAL LOW (ref 135–145)

## 2023-07-05 LAB — COMPREHENSIVE METABOLIC PANEL WITH GFR
ALT: 17 U/L (ref 0–44)
AST: 18 U/L (ref 15–41)
Albumin: 4.7 g/dL (ref 3.5–5.0)
Alkaline Phosphatase: 61 U/L (ref 38–126)
Anion gap: 14 (ref 5–15)
BUN: 14 mg/dL (ref 8–23)
CO2: 20 mmol/L — ABNORMAL LOW (ref 22–32)
Calcium: 9.1 mg/dL (ref 8.9–10.3)
Chloride: 96 mmol/L — ABNORMAL LOW (ref 98–111)
Creatinine, Ser: 0.78 mg/dL (ref 0.44–1.00)
GFR, Estimated: >60 mL/min (ref >60)
Glucose, Bld: 95 mg/dL (ref 70–99)
Potassium: 3.4 mmol/L — ABNORMAL LOW (ref 3.5–5.1)
Sodium: 130 mmol/L — ABNORMAL LOW (ref 135–145)
Total Bilirubin: 0.3 mg/dL (ref 0.0–1.2)
Total Protein: 7.1 g/dL (ref 6.5–8.1)

## 2023-07-05 LAB — CBC
HCT: 33.4 % — ABNORMAL LOW (ref 36.0–46.0)
Hemoglobin: 11.8 g/dL — ABNORMAL LOW (ref 12.0–15.0)
MCH: 33.9 pg (ref 26.0–34.0)
MCHC: 35.3 g/dL (ref 30.0–36.0)
MCV: 96 fL (ref 80.0–100.0)
Platelets: 317 K/uL (ref 150–400)
RBC: 3.48 MIL/uL — ABNORMAL LOW (ref 3.87–5.11)
RDW: 11.9 % (ref 11.5–15.5)
WBC: 5.6 K/uL (ref 4.0–10.5)
nRBC: 0 % (ref 0.0–0.2)

## 2023-07-05 MED ORDER — SODIUM CHLORIDE 0.9 % IV BOLUS
1000.0000 mL | Freq: Once | INTRAVENOUS | Status: AC
  Administered 2023-07-05: 1000 mL via INTRAVENOUS

## 2023-07-05 MED ORDER — POTASSIUM CHLORIDE CRYS ER 20 MEQ PO TBCR
40.0000 meq | EXTENDED_RELEASE_TABLET | Freq: Once | ORAL | Status: AC
  Administered 2023-07-05: 40 meq via ORAL
  Filled 2023-07-05: qty 2

## 2023-07-05 NOTE — Telephone Encounter (Signed)
 Copied from CRM [id number]. Topic: Clinical - Lab/Test Results >> Jul 05, 2023  3:18 PM Allyne Areola wrote: Reason for CRM: Patient is calling regarding her sodium levels, she would like to speak with Dr.Tullo or her nurse as soon as possible regarding some questions and concerns she has.

## 2023-07-05 NOTE — Addendum Note (Signed)
 Addended by: Lindle Rhea on: 07/05/2023 11:23 AM   Modules accepted: Orders

## 2023-07-05 NOTE — Progress Notes (Deleted)
   Joanna Muck, PhD, LAT, ATC acting as a scribe for Garlan Juniper, MD.  Adrienne Robinson is a 68 y.o. female who presents to [facility] today for back pain. Pt was previously seen by Dr. Felipe Horton on 06/21/23 for bilat thumb pain, multiple trigger points in her back, and OMT.  Today, pt c/o worsening back pain x *** after lifting a heavy suitcase. Pt locates pain to ***  Radiating pain: LE numbness/tingling: LE weakness: Aggravates: Treatments tried:  Pertinent review of systems: ***  Relevant historical information: ***   Exam:  There were no vitals taken for this visit. General: Well Developed, well nourished, and in no acute distress.   MSK: ***    Lab and Radiology Results Results for orders placed or performed in visit on 07/04/23 (from the past 72 hours)  Basic metabolic panel     Status: Abnormal   Collection Time: 07/04/23  3:26 PM  Result Value Ref Range   Sodium 128 (L) 135 - 145 mEq/L   Potassium 3.5 3.5 - 5.1 mEq/L   Chloride 97 96 - 112 mEq/L   CO2 20 19 - 32 mEq/L   Glucose, Bld 80 70 - 99 mg/dL   BUN 14 6 - 23 mg/dL   Creatinine, Ser 7.82 0.40 - 1.20 mg/dL   GFR 95.62 >13.08 mL/min    Comment: Calculated using the CKD-EPI Creatinine Equation (2021)   Calcium  9.4 8.4 - 10.5 mg/dL   *Note: Due to a large number of results and/or encounters for the requested time period, some results have not been displayed. A complete set of results can be found in Results Review.   No results found.     Assessment and Plan: 68 y.o. female with ***   PDMP not reviewed this encounter. No orders of the defined types were placed in this encounter.  No orders of the defined types were placed in this encounter.    Discussed warning signs or symptoms. Please see discharge instructions. Patient expresses understanding.   ***

## 2023-07-05 NOTE — Discharge Instructions (Signed)
 Please follow-up closely with your primary care doctor, continue taking your sodium tablets, please return if you have worsening tingling, new numbness, weakness, vision changes

## 2023-07-05 NOTE — ED Provider Notes (Signed)
 [place] [facility] AT [facility] Provider Note   CSN: [id number] Arrival date & time: 07/05/23  1617     History  Chief Complaint  Patient presents with   Abnormal Lab    Na    Adrienne Robinson is a 68 y.o. female with past medical history seen for hypertension, osteoporosis, fibromyalgia, intermittent hyponatremia who presents with concern for hyponatremia from doctors office with some tingling in her hands.  Patient reports she has history of same, normally presents for single fluid bolus administration, she takes sodium tablets at home but occasionally runs low.  She denies any vision changes, new weakness, confusion.  She reports the tingling is mild, and similar to her previous low sodium presentations.  Denies any nausea, vomiting.   Abnormal Lab      Home Medications Prior to Admission medications   Medication Sig Start Date End Date Taking? Authorizing Provider  amLODipine  (NORVASC ) 10 MG tablet Take 1 tablet (10 mg total) by mouth daily. 10/09/22   Thersia Flax, MD  ARIPiprazole  (ABILIFY ) 2 MG tablet TAKE 1 TABLET BY MOUTH EVERY DAY 02/01/23   Thersia Flax, MD  buPROPion  (WELLBUTRIN  XL) 150 MG 24 hr tablet Take 1 tablet (150 mg total) by mouth daily. 01/09/23   Thersia Flax, MD  cyanocobalamin  (,VITAMIN B-12,) 1000 MCG/ML injection INJECT 1 ML (1,000 MCG TOTAL) INTO THE MUSCLE ONCE A WEEK. Patient taking differently: Inject 1,000 mcg into the muscle every 30 (thirty) days. 11/23/20   Thersia Flax, MD  esomeprazole  (NEXIUM ) 20 MG capsule TAKE 1 CAPSULE (20 MG TOTAL) BY MOUTH DAILY. **PLEASE CONTACT OFFICE TO SCHEDULE FOLLOW UP 09/22/22   Pyrtle, Amber Bail, MD  famciclovir  (FAMVIR ) 500 MG tablet Take 2 tablets (1,000 mg total) by mouth 2 (two) times daily. 01/09/23   Thersia Flax, MD  furosemide  (LASIX ) 20 MG tablet Take 1 tablet (20 mg total) by mouth daily with breakfast. As needed for edema 04/13/23   Thersia Flax, MD   HYDROcodone -acetaminophen  (NORCO) 10-325 MG tablet Take 1 tablet by mouth every 6 (six) hours as needed for severe pain (pain score 7-10). 05/16/23 06/15/23  Thersia Flax, MD  HYDROcodone -acetaminophen  (NORCO) 10-325 MG tablet Take 1 tablet by mouth every 6 (six) hours as needed. 05/16/23   Thersia Flax, MD  HYDROcodone -acetaminophen  (NORCO) 10-325 MG tablet Take 1 tablet by mouth every 6 (six) hours as needed. 06/13/23   Thersia Flax, MD  Iron -Vitamin C  65-125 MG TABS Take 1 tablet by mouth daily. 06/09/22   Timmy Forbes, MD  LINZESS  145 MCG CAPS capsule TAKE 1 CAPSULE BY MOUTH EVERY DAY BEFORE BREAKFAST 02/14/22   Pyrtle, Amber Bail, MD  metoprolol  succinate (TOPROL -XL) 25 MG 24 hr tablet Take 1 tablet (25 mg total) by mouth daily. 04/13/23   Tullo, Teresa L, MD  Multiple Vitamin (MULTIVITAMIN) tablet Take 1 tablet by mouth daily.    [provider]  predniSONE  (DELTASONE ) 10 MG tablet 6 tablets on Day 1 , then reduce by 1 tablet daily until gone 04/13/23   Thersia Flax, MD  propranolol  (INDERAL ) 10 MG tablet TAKE 1/2 TABLET BY MOUTH AS NEEDED FOR ANXIETY 10/21/21   Webb, Padonda B, FNP  Romosozumab -aqqg (EVENITY ) 105 MG/1. SOSY injection Inject 210 mg into the skin every 30 (thirty) days. 05/16/23   Thersia Flax, MD  sodium chloride  1 g tablet Take 1 tablet (1 g total) by mouth 2 (two) times daily with a  meal. 09/15/20   Thersia Flax, MD  Syringe/Needle, Disp, (SYRINGE 3CC/25GX1") 25G X 1" 3 ML MISC Use for b12 injections 08/21/18   Thersia Flax, MD  zolpidem  (AMBIEN  CR) 12.5 MG CR tablet TAKE 1 TABLET (12.5 MG TOTAL) BY MOUTH AT BEDTIME AS NEEDED. FOR SLEEP 05/21/23   Thersia Flax, MD      Allergies    Buspirone , Celebrex  [celecoxib ], Adhesive [tape], and Morphine and codeine    Review of Systems   Review of Systems  All other systems reviewed and are negative.   Physical Exam Updated Vital Signs BP (!) 151/82 (BP Location: Left Arm)   Pulse 80   Temp 97.7 F (36.5 C)  (Oral)   Resp 18   Wt 56 kg   SpO2 100%   BMI 21.87 kg/m  Physical Exam Vitals and nursing note reviewed.  Constitutional:      General: She is not in acute distress.    Appearance: Normal appearance.  HENT:     Head: Normocephalic and atraumatic.  Eyes:     General:        Right eye: No discharge.        Left eye: No discharge.  Cardiovascular:     Rate and Rhythm: Normal rate and regular rhythm.     Heart sounds: No murmur heard.    No friction rub. No gallop.  Pulmonary:     Effort: Pulmonary effort is normal.     Breath sounds: Normal breath sounds.  Abdominal:     General: Bowel sounds are normal.     Palpations: Abdomen is soft.  Skin:    General: Skin is warm and dry.     Capillary Refill: Capillary refill takes less than 2 seconds.  Neurological:     Mental Status: She is alert and oriented to person, place, and time.     Comments: Cranial nerves II through XII grossly intact.  Intact finger-nose, intact heel-to-shin.  Romberg negative, gait normal.  Alert and oriented x3.  Moves all 4 limbs spontaneously, normal coordination.  No pronator drift.  Intact strength 5 out of 5 bilateral upper and lower extremities.  Psychiatric:        Mood and Affect: Mood normal.        Behavior: Behavior normal.     ED Results / Procedures / Treatments   Labs (all labs ordered are listed, but only abnormal results are displayed) Labs Reviewed  CBC - Abnormal; Notable for the following components:      Result Value   RBC 3.48 (*)    Hemoglobin 11.8 (*)    HCT 33.4 (*)    All other components within normal limits  COMPREHENSIVE METABOLIC PANEL WITH GFR - Abnormal; Notable for the following components:   Sodium 130 (*)    Potassium 3.4 (*)    Chloride 96 (*)    CO2 20 (*)    All other components within normal limits    EKG None  Radiology No results found.  Procedures Procedures    Medications Ordered in ED Medications  sodium chloride  0.9 % bolus 1,000 mL  (1,000 mLs Intravenous New Bag/Given 07/05/23 1825)  potassium chloride  SA (KLOR-CON  M) CR tablet 40 mEq (40 mEq Oral Given 07/05/23 1817)    ED Course/ Medical Decision Making/ A&P  Medical Decision Making Amount and/or Complexity of Data Reviewed Labs: ordered.  Risk Prescription drug management.   This patient is a 68 y.o. female who presents to the ED for concern of hyponatremia, extremity tingling.   Differential diagnoses prior to evaluation: Stroke, TIA, MS, symptomatic hyponatremia, hypokalemia, peripheral neuropathy, versus other.  Past Medical History / Social History / Additional history: Chart reviewed. Pertinent results include: Extensively reviewed lab work, imaging from her previous emergency department evaluations, she has history of fibromyalgia, hyponatremia, hypertension, osteoporosis   Physical Exam: Physical exam performed. The pertinent findings include: well-appearing, no focal neurologic deficits, vital signs stable other than some hypertension, blood pressure 151/82.  Labwork: I independently interpreted CBC, CMP, her CBC with mild anemia, hemoglobin 1.8, not significantly changed from her baseline, CMP notable for mild hyponatremia, sodium 130 slightly improved from 128 yesterday, she also has very mild hypokalemia, potassium 3.4.  We will orally replete potassium and given improvement in the past I think reasonable to give 1 L normal saline fluid bolus, no history of heart failure or other contraindication.  Medications / Treatment: Fluid bolus and potassium administered, patient feeling better on reassessment, encouraged follow-up with her PCP, considered additional neurologic investigation but given presentation is similar to her previous and she has no focal neurologic deficits I do not think that CT or MRI is clinically indicated at this time.   Disposition: After consideration of the diagnostic results and the patients  response to treatment, I feel that patient stable for discharge with plan as above .   emergency department workup does not suggest an emergent condition requiring admission or immediate intervention beyond what has been performed at this time. The plan is: as above. The patient is safe for discharge and has been instructed to return immediately for worsening symptoms, change in symptoms or any other concerns.  Final Clinical Impression(s) / ED Diagnoses Final diagnoses:  Hyponatremia    Rx / DC Orders ED Discharge Orders     None         Stefan Edge 07/05/23 1840    Nicklas Barns, MD 07/07/23 [phone]

## 2023-07-05 NOTE — ED Notes (Signed)
 Pt alert and oriented X 4 at the time of discharge. RR even and unlabored. No acute distress noted. Pt verbalized understanding of discharge instructions as discussed. Pt ambulatory to lobby at time of discharge.

## 2023-07-05 NOTE — ED Triage Notes (Addendum)
 Reports was called by her doctor for hyponatremia , here for infusion .  Hx of it . Reports tingling to her fingertips .  Documented on the chart as well.

## 2023-07-06 ENCOUNTER — Ambulatory Visit (INDEPENDENT_AMBULATORY_CARE_PROVIDER_SITE_OTHER)

## 2023-07-06 ENCOUNTER — Ambulatory Visit (INDEPENDENT_AMBULATORY_CARE_PROVIDER_SITE_OTHER): Admitting: Sports Medicine

## 2023-07-06 VITALS — BP 124/70 | HR 73 | Ht 63.0 in

## 2023-07-06 DIAGNOSIS — M5442 Lumbago with sciatica, left side: Secondary | ICD-10-CM | POA: Diagnosis not present

## 2023-07-06 DIAGNOSIS — M5441 Lumbago with sciatica, right side: Secondary | ICD-10-CM | POA: Diagnosis not present

## 2023-07-06 DIAGNOSIS — R29898 Other symptoms and signs involving the musculoskeletal system: Secondary | ICD-10-CM

## 2023-07-06 DIAGNOSIS — S22000A Wedge compression fracture of unspecified thoracic vertebra, initial encounter for closed fracture: Secondary | ICD-10-CM

## 2023-07-06 DIAGNOSIS — G8929 Other chronic pain: Secondary | ICD-10-CM

## 2023-07-06 MED ORDER — MELOXICAM 15 MG PO TABS: 15.0000 mg | ORAL_TABLET | Freq: Every day | ORAL | 0 refills | Status: DC

## 2023-07-06 NOTE — Addendum Note (Signed)
 Addended by: Sheria Dills on: 07/06/2023 12:35 PM   Modules accepted: Orders

## 2023-07-06 NOTE — Telephone Encounter (Signed)
 See my chart message

## 2023-07-06 NOTE — Progress Notes (Signed)
 [name] D.[name] [address] [place] [address] Phone: [phone]   Assessment and Plan:     1. Chronic bilateral low back pain with bilateral sciatica 2. Weakness of both lower extremities -Chronic with exacerbation, complicated, subsequent sports medicine visit - Recurrent low back pain after patient felt a "pop" in low back when trying to lift a heavy suitcase.  Red flag symptom of bilateral lower extremity weakness that is new since this incident - X-ray obtained in clinic.  My interpretation: Lumbar scoliosis.  Lumbar degenerative disc disease.  Question L5 compression fracture.  Will await radiology review - Due to red flag symptoms of bilateral lower extremity weakness after a new incident that caused sharp low back pain, recommend stat MRI to evaluate for vertebral compression fracture versus new neurologic impingement.  Patient states she has to have an open MRI due to claustrophobia, so we will send MRI to [address] - Start meloxicam  15 mg daily x2 weeks.  If still having pain after 2 weeks, complete 3rd-week of NSAID. May use remaining NSAID as needed once daily for pain control.  Do not to use additional over-the-counter NSAIDs (ibuprofen , naproxen, Advil , Aleve, etc.) while taking prescription NSAIDs.  May use Tylenol  [phone] mg 2 to 3 times a day for breakthrough pain.  -Do not recommend OMT at today's visit as this could potentially worsen patient's condition.  Could consider OMT if no significant findings on MRI   Pertinent previous records reviewed include none  Follow Up: 1 to 2 days after MRI to review results and discuss treatment plan   Subjective:   I, Adrienne Robinson, am serving as a Neurosurgeon for Doctor Ulysees Gander  Chief Complaint: OMT  HPI:   06/21/2023 Adrienne Robinson is a 68 y.o. female coming in with complaint of back and neck pain. OMT on 05/03/2023. F/u CMC joint pain. Patient states that she would like  injection in L CMC joint.     Would like trigger point injections distal to R scapula and medial to the L scapula.     Also f/u nodules in palm of R hand beneath the 3rd and 4th fingers causing fingers to go numb.     07/06/23 Patient states here for OMT after putting a suitcase in her car she heard and felt a pop mid spine on the right side. Patient states that she feels weak and unstable.    Relevant Historical Information: Hypertension, IBS, anemia, fibromyalgia  Additional pertinent review of systems negative.  Current Outpatient Medications  Medication Sig Dispense Refill   meloxicam  (MOBIC ) 15 MG tablet Take 1 tablet (15 mg total) by mouth daily. 30 tablet 0   amLODipine  (NORVASC ) 10 MG tablet Take 1 tablet (10 mg total) by mouth daily. 90 tablet 3   ARIPiprazole  (ABILIFY ) 2 MG tablet TAKE 1 TABLET BY MOUTH EVERY DAY 90 tablet 1   buPROPion  (WELLBUTRIN  XL) 150 MG 24 hr tablet Take 1 tablet (150 mg total) by mouth daily. 90 tablet 1   cyanocobalamin  (,VITAMIN B-12,) 1000 MCG/ML injection INJECT 1 ML (1,000 MCG TOTAL) INTO THE MUSCLE ONCE A WEEK. (Patient taking differently: Inject 1,000 mcg into the muscle every 30 (thirty) days.) 12 mL 6   esomeprazole  (NEXIUM ) 20 MG capsule TAKE 1 CAPSULE (20 MG TOTAL) BY MOUTH DAILY. **PLEASE CONTACT OFFICE TO SCHEDULE FOLLOW UP 90 capsule 0   famciclovir  (FAMVIR ) 500 MG tablet Take 2 tablets (1,000 mg total) by mouth 2 (two)  times daily. 4 tablet 1   furosemide  (LASIX ) 20 MG tablet Take 1 tablet (20 mg total) by mouth daily with breakfast. As needed for edema 90 tablet 1   HYDROcodone -acetaminophen  (NORCO) 10-325 MG tablet Take 1 tablet by mouth every 6 (six) hours as needed for severe pain (pain score 7-10). 120 tablet 0   HYDROcodone -acetaminophen  (NORCO) 10-325 MG tablet Take 1 tablet by mouth every 6 (six) hours as needed. 120 tablet 0   HYDROcodone -acetaminophen  (NORCO) 10-325 MG tablet Take 1 tablet by mouth every 6 (six) hours as needed.  120 tablet 0   Iron -Vitamin C  65-125 MG TABS Take 1 tablet by mouth daily. 90 tablet 1   LINZESS  145 MCG CAPS capsule TAKE 1 CAPSULE BY MOUTH EVERY DAY BEFORE BREAKFAST 90 capsule 0   metoprolol  succinate (TOPROL -XL) 25 MG 24 hr tablet Take 1 tablet (25 mg total) by mouth daily. 90 tablet 1   Multiple Vitamin (MULTIVITAMIN) tablet Take 1 tablet by mouth daily.     predniSONE  (DELTASONE ) 10 MG tablet 6 tablets on Day 1 , then reduce by 1 tablet daily until gone 21 tablet 0   propranolol  (INDERAL ) 10 MG tablet TAKE 1/2 TABLET BY MOUTH AS NEEDED FOR ANXIETY 15 tablet 1   Romosozumab -aqqg (EVENITY ) 105 MG/1. SOSY injection Inject 210 mg into the skin every 30 (thirty) days. 2.4 mL 10   sodium chloride  1 g tablet Take 1 tablet (1 g total) by mouth 2 (two) times daily with a meal. 60 tablet 2   Syringe/Needle, Disp, (SYRINGE 3CC/25GX1") 25G X 1" 3 ML MISC Use for b12 injections 50 each 0   zolpidem  (AMBIEN  CR) 12.5 MG CR tablet TAKE 1 TABLET (12.5 MG TOTAL) BY MOUTH AT BEDTIME AS NEEDED. FOR SLEEP 15 tablet 5   Current Facility-Administered Medications  Medication Dose Route Frequency Provider Last Rate Last Admin   Romosozumab -aqqg (EVENITY ) 105 MG/1. injection 210 mg  210 mg Subcutaneous Once Tullo, Teresa L, MD       Romosozumab -aqqg (EVENITY ) 105 MG/1. injection 210 mg  210 mg Subcutaneous Once Tullo, Teresa L, MD       Romosozumab -aqqg (EVENITY ) 105 MG/1. injection 210 mg  210 mg Subcutaneous Once Thersia Flax, MD          Objective:     Vitals:   07/06/23 1054  BP: 124/70  Pulse: 73  SpO2: 97%  Height: 5\' 3"  (1.6 m)      Body mass index is 21.87 kg/m.    Physical Exam:     Gen: Appears well, nad, nontoxic and pleasant Psych: Alert and oriented, appropriate mood and affect Neuro: sensation intact, strength is 4/5 in bilateral lower extremities.  Muscle tone wnl Skin: no susupicious lesions or rashes  Back - Normal skin, Spine with normal alignment and no  deformity.     tenderness to lumbar vertebral process palpation.   Bilateral lumbar paraspinous muscles are   tender and without spasm NTTP gluteal musculature Straight leg raise positive bilaterally  Gait antalgic with slow steps  Electronically signed by:  Marshall Skeeter D.[name] 11:24 AM 07/06/23

## 2023-07-06 NOTE — Patient Instructions (Signed)
 Good to see you  X ray on way out Mri of low back ordered [facility] location   - Start meloxicam  15 mg daily x2 weeks.  If still having pain after 2 weeks, complete 3rd-week of NSAID. May use remaining NSAID as needed once daily for pain control.  Do not to use additional over-the-counter NSAIDs (ibuprofen , naproxen, Advil , Aleve, etc.) while taking prescription NSAIDs.  May use Tylenol  [phone] mg 2 to 3 times a day for breakthrough pain.  Follow up 2 days after MRI, but Get a Disk of the MRI from [facility] to bring to the visit

## 2023-07-09 ENCOUNTER — Other Ambulatory Visit: Payer: Self-pay | Admitting: Sports Medicine

## 2023-07-09 ENCOUNTER — Telehealth: Payer: Self-pay | Admitting: Sports Medicine

## 2023-07-09 DIAGNOSIS — F40240 Claustrophobia: Secondary | ICD-10-CM

## 2023-07-09 MED ORDER — LORAZEPAM 0.5 MG PO TABS: ORAL_TABLET | ORAL | 0 refills | Status: DC

## 2023-07-09 NOTE — Telephone Encounter (Signed)
 MRI stating that she is scheduled for the MRI on June 4th at [facility] (this was first available).  Patient is ecotrophic and asked if Vallum could be sent to [facility] in [place]?  Please advise.

## 2023-07-09 NOTE — Progress Notes (Signed)
 Ativan  0.5 mg 1-2 doses 30 minutes before procedure prescribed due to claustrophobia for MRI.

## 2023-07-10 NOTE — Telephone Encounter (Signed)
 Patient is scheduled for an Evenity  injection on 08/06/23.

## 2023-07-11 ENCOUNTER — Encounter: Payer: Self-pay | Admitting: Internal Medicine

## 2023-07-11 ENCOUNTER — Ambulatory Visit: Payer: Managed Care, Other (non HMO) | Admitting: Internal Medicine

## 2023-07-11 VITALS — BP 130/80 | HR 68 | Temp 98.0°F | Ht 63.0 in | Wt 122.8 lb

## 2023-07-11 DIAGNOSIS — G894 Chronic pain syndrome: Secondary | ICD-10-CM | POA: Diagnosis not present

## 2023-07-11 DIAGNOSIS — E871 Hypo-osmolality and hyponatremia: Secondary | ICD-10-CM

## 2023-07-11 DIAGNOSIS — E559 Vitamin D deficiency, unspecified: Secondary | ICD-10-CM

## 2023-07-11 DIAGNOSIS — F32A Depression, unspecified: Secondary | ICD-10-CM

## 2023-07-11 DIAGNOSIS — F419 Anxiety disorder, unspecified: Secondary | ICD-10-CM | POA: Diagnosis not present

## 2023-07-11 MED ORDER — HYDROCODONE-ACETAMINOPHEN 10-325 MG PO TABS: 1.0000 | ORAL_TABLET | Freq: Four times a day (QID) | ORAL | 0 refills | Status: DC | PRN

## 2023-07-11 MED ORDER — HYDROCODONE-ACETAMINOPHEN 10-325 MG PO TABS
1.0000 | ORAL_TABLET | Freq: Four times a day (QID) | ORAL | 0 refills | Status: DC | PRN
Start: 2023-07-11 — End: 2023-07-11

## 2023-07-11 NOTE — Progress Notes (Unsigned)
 Subjective:  Patient ID: Adrienne Robinson, female    DOB: 1956/01/30  Age: 68 y.o. MRN: [id number]  CC: The primary encounter diagnosis was Hyponatremia. Diagnoses of Vitamin D  deficiency, Chronic pain syndrome, and Anxiety and depression were also pertinent to this visit.   HPI Adrienne Robinson presents for  Chief Complaint  Patient presents with   Medical Management of Chronic Issues    1) pain management:  patient reports that  8 days ago, recently while lifting a heavy bag of dialysis fluid she heard a crack and felt a sudden onset of "funny feeling" in her mid spine.      On Wdnesday she became hyponatremic symptoatmci [facility]   Was seen by Jacksion DO on Friday   x rays noted 50% compression fractures to T10,11 and 12  more avanced than prior . Leg weakness has improved,  but the back pain is worse.  Taking meloxicam   prescribed by Cleora Daft   2) Hyponatremia:  she developed tingling in her fingers and toes and requested and required an  ER visit  for management of sodium of 128.  Repeat level in ER prior to infusion was 130 . She was given 1 L IV NS and discharged.  Per review of ER records she had missed a few home doses of  salt tablets.  Her last prednisone  taper . Was over 3 months ago .  A repeat sodium was not done after the infusion     Outpatient Medications Prior to Visit  Medication Sig Dispense Refill   amLODipine  (NORVASC ) 10 MG tablet Take 1 tablet (10 mg total) by mouth daily. 90 tablet 3   ARIPiprazole  (ABILIFY ) 2 MG tablet TAKE 1 TABLET BY MOUTH EVERY DAY 90 tablet 1   buPROPion  (WELLBUTRIN  XL) 150 MG 24 hr tablet Take 1 tablet (150 mg total) by mouth daily. 90 tablet 1   cyanocobalamin  (,VITAMIN B-12,) 1000 MCG/ML injection INJECT 1 ML (1,000 MCG TOTAL) INTO THE MUSCLE ONCE A WEEK. (Patient taking differently: Inject 1,000 mcg into the muscle every 30 (thirty) days.) 12 mL 6   esomeprazole  (NEXIUM ) 20 MG capsule TAKE 1 CAPSULE (20 MG  TOTAL) BY MOUTH DAILY. **PLEASE CONTACT OFFICE TO SCHEDULE FOLLOW UP 90 capsule 0   famciclovir  (FAMVIR ) 500 MG tablet Take 2 tablets (1,000 mg total) by mouth 2 (two) times daily. 4 tablet 1   furosemide  (LASIX ) 20 MG tablet Take 1 tablet (20 mg total) by mouth daily with breakfast. As needed for edema 90 tablet 1   HYDROcodone -acetaminophen  (NORCO) 10-325 MG tablet Take 1 tablet by mouth every 6 (six) hours as needed. 120 tablet 0   Iron -Vitamin C  65-125 MG TABS Take 1 tablet by mouth daily. 90 tablet 1   LINZESS  145 MCG CAPS capsule TAKE 1 CAPSULE BY MOUTH EVERY DAY BEFORE BREAKFAST 90 capsule 0   LORazepam  (ATIVAN ) 0.5 MG tablet 1-2 tabs 30 - 60 min prior to MRI. Do not drive with this medicine. 4 tablet 0   meloxicam  (MOBIC ) 15 MG tablet Take 1 tablet (15 mg total) by mouth daily. 30 tablet 0   metoprolol  succinate (TOPROL -XL) 25 MG 24 hr tablet Take 1 tablet (25 mg total) by mouth daily. 90 tablet 1   Multiple Vitamin (MULTIVITAMIN) tablet Take 1 tablet by mouth daily.     propranolol  (INDERAL ) 10 MG tablet TAKE 1/2 TABLET BY MOUTH AS NEEDED FOR ANXIETY 15 tablet 1   Romosozumab -aqqg (EVENITY ) 105 MG/1. SOSY  injection Inject 210 mg into the skin every 30 (thirty) days. 2.4 mL 10   sodium chloride  1 g tablet Take 1 tablet (1 g total) by mouth 2 (two) times daily with a meal. 60 tablet 2   Syringe/Needle, Disp, (SYRINGE 3CC/25GX1") 25G X 1" 3 ML MISC Use for b12 injections 50 each 0   zolpidem  (AMBIEN  CR) 12.5 MG CR tablet TAKE 1 TABLET (12.5 MG TOTAL) BY MOUTH AT BEDTIME AS NEEDED. FOR SLEEP 15 tablet 5   HYDROcodone -acetaminophen  (NORCO) 10-325 MG tablet Take 1 tablet by mouth every 6 (six) hours as needed. 120 tablet 0   predniSONE  (DELTASONE ) 10 MG tablet 6 tablets on Day 1 , then reduce by 1 tablet daily until gone 21 tablet 0   HYDROcodone -acetaminophen  (NORCO) 10-325 MG tablet Take 1 tablet by mouth every 6 (six) hours as needed for severe pain (pain score 7-10). 120 tablet 0    Facility-Administered Medications Prior to Visit  Medication Dose Route Frequency Provider Last Rate Last Admin   Romosozumab -aqqg (EVENITY ) 105 MG/1. injection 210 mg  210 mg Subcutaneous Once Maclovio Henson L, MD       Romosozumab -aqqg (EVENITY ) 105 MG/1. injection 210 mg  210 mg Subcutaneous Once Tzivia Oneil L, MD       Romosozumab -aqqg (EVENITY ) 105 MG/1. injection 210 mg  210 mg Subcutaneous Once Joh Rao L, MD        Review of Systems;  Patient denies headache, fevers, malaise, unintentional weight loss, skin rash, eye pain, sinus congestion and sinus pain, sore throat, dysphagia,  hemoptysis , cough, dyspnea, wheezing, chest pain, palpitations, orthopnea, edema, abdominal pain, nausea, melena, diarrhea, constipation, flank pain, dysuria, hematuria, urinary  Frequency, nocturia, numbness, tingling, seizures,  Focal weakness, Loss of consciousness,  Tremor, insomnia, depression, anxiety, and suicidal ideation.      Objective:  BP 130/80   Pulse 68   Temp 98 F (36.7 C)   Ht 5\' 3"  (1.6 m)   Wt 122 lb 12.8 oz (55.7 kg)   SpO2 98%   BMI 21.75 kg/m   BP Readings from Last 3 Encounters:  07/11/23 130/80  07/06/23 124/70  07/05/23 (!) 160/86    Wt Readings from Last 3 Encounters:  07/11/23 122 lb 12.8 oz (55.7 kg)  07/05/23 123 lb 7.3 oz (56 kg)  06/21/23 124 lb (56.2 kg)    Physical Exam Vitals reviewed.  Constitutional:      General: She is not in acute distress.    Appearance: Normal appearance. She is normal weight. She is not ill-appearing, toxic-appearing or diaphoretic.  HENT:     Head: Normocephalic.  Eyes:     General: No scleral icterus.       Right eye: No discharge.        Left eye: No discharge.     Conjunctiva/sclera: Conjunctivae normal.  Cardiovascular:     Rate and Rhythm: Normal rate and regular rhythm.     Heart sounds: Normal heart sounds.  Pulmonary:     Effort: Pulmonary effort is normal. No respiratory distress.      Breath sounds: Normal breath sounds.  Musculoskeletal:        General: Normal range of motion.  Skin:    General: Skin is warm and dry.  Neurological:     General: No focal deficit present.     Mental Status: She is alert and oriented to person, place, and time. Mental status is at baseline.  Psychiatric:  Mood and Affect: Mood normal.        Behavior: Behavior normal.        Thought Content: Thought content normal.        Judgment: Judgment normal.    Lab Results  Component Value Date   HGBA1C 5.7 08/04/2011    Lab Results  Component Value Date   CREATININE 0.62 07/11/2023   CREATININE 0.78 07/05/2023   CREATININE 0.72 07/04/2023    Lab Results  Component Value Date   WBC 5.6 07/05/2023   HGB 11.8 (L) 07/05/2023   HCT 33.4 (L) 07/05/2023   PLT 317 07/05/2023   GLUCOSE 88 07/11/2023   CHOL 206 (H) 11/04/2021   TRIG 170 (H) 11/04/2021   HDL 57 11/04/2021   LDLDIRECT 81.0 01/22/2020   LDLCALC 120 (H) 11/04/2021   ALT 17 07/05/2023   AST 18 07/05/2023   NA 125 (L) 07/11/2023   K 3.5 07/11/2023   CL 93 (L) 07/11/2023   CREATININE 0.62 07/11/2023   BUN 15 07/11/2023   CO2 22 07/11/2023   TSH 0.979 01/16/2022   INR 1.0 07/12/2020   HGBA1C 5.7 08/04/2011   MICROALBUR <0.7 06/07/2021    DG Lumbar Spine 2-3 Views Result Date: 07/06/2023 CLINICAL DATA:  Low back pain after lifting heavy suitcase 3 days ago. EXAM: LUMBAR SPINE - 2-3 VIEW COMPARISON:  September 29, 2020 FINDINGS: 50% compression deformities are noted at T10, T11 and T12 more advanced compared to prior exam. Evaluation is limited on this lumbar x-ray. Scoliosis. Grade 1 anterolisthesis of L5 on S1. Severe narrow intervertebral spaces are identified at L2-3, L3-4. Moderate narrow intervertebral space is noted at L4-5, L5-S1. Facet joint sclerosis are noted at L3-4, L4-5 and L5-S1. IMPRESSION: 1. 50% compression deformities are noted at T10, T11 and T12 more advanced compared to prior exam. Evaluation is  limited on this lumbar x-ray. Recommend further evaluation with thoracic spine x-rays. 2. 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 Electronically Signed   By: Anna Barnes M.D.   On: 07/06/2023 11:30    Assessment & Plan:  .Hyponatremia Assessment & Plan: Recurrent; etiology remains unclear. Lexapro  and prednisone  were considered as causative but episodes  have continued despite avoidance of both.  She had an infusion of NS 2 days ago which dropped her sodium from 130 to 125  , will avoid NS infusions and resume salt supplementation 1 gram tid   Orders: -     Basic metabolic panel with GFR -     Cortisol-am, blood; Future -     Basic metabolic panel with GFR; Future -     Sodium, urine, random; Future  Vitamin D  deficiency -     Basic metabolic panel with GFR -     VITAMIN D  25 Hydroxy (Vit-D Deficiency, Fractures)  Chronic pain syndrome Assessment & Plan: Mechille continues to use  pharmacologic and non pharmacologic methods to manage her arthritis pain  .  she is using a standing desk for work mode and opioids for non operable pain.  She has not had any ER visits for pain management and has not requested any early refills.  Her Refill history was confirmed via [place] Controlled Substance database by me today during her visit and there have been no prescriptions of controlled substances filled from any providers other than me.   She recently had compression fractures of T10-12 vertebrae .  I have increased her quantity of hydrocodone  to #230/month for one month due to  her current l level of pain      Anxiety and depression Assessment & Plan: Currently taking wellbutrin  XL 150 mg daily  ,  lexapro  stopped due to acute hyponatremia.  Symptoms are stable since adding abilify  2 mg daily    Other orders -     HYDROcodone -Acetaminophen ; Take 1-2 tablets by mouth every 6 (six) hours as needed for severe pain (pain score 7-10).  Dispense: 240 tablet; Refill: 0     I spent  34 minutes on the day of this face to face encounter reviewing patient's  most recent visit with sports medicine,  prior relevant surgical and non surgical procedures, recent  labs and imaging studies, counseling on weight management,  reviewing the assessment and plan with patient, and post visit ordering and reviewing of  diagnostics and therapeutics with patient  .   Follow-up: Return in about 3 months (around 10/11/2023) for chronic pain management.   Thersia Flax, MD

## 2023-07-11 NOTE — Patient Instructions (Signed)
 I have doubled the hydrocodone  quantity so you can take up to 8 tablets daily for the next 30 days    Rechecking your sodium,  potassium and Vitamin D   today

## 2023-07-11 NOTE — Assessment & Plan Note (Signed)
 Adrienne Robinson continues to use  pharmacologic and non pharmacologic methods to manage her arthritis pain  .  she is using a standing desk for work mode and opioids for non operable pain.  She has not had any ER visits for pain management and has not requested any early refills.  Her Refill history was confirmed via [place] Controlled Substance database by me today during her visit and there have been no prescriptions of controlled substances filled from any providers other than me. Refilling for 3 months at a time.

## 2023-07-12 ENCOUNTER — Ambulatory Visit: Payer: Self-pay | Admitting: Internal Medicine

## 2023-07-12 LAB — BASIC METABOLIC PANEL WITH GFR
BUN: 15 mg/dL (ref 6–23)
CO2: 22 meq/L (ref 19–32)
Calcium: 8.6 mg/dL (ref 8.4–10.5)
Chloride: 93 meq/L — ABNORMAL LOW (ref 96–112)
Creatinine, Ser: 0.62 mg/dL (ref 0.40–1.20)
GFR: 92.09 mL/min (ref >60.00)
Glucose, Bld: 88 mg/dL (ref 70–99)
Potassium: 3.5 meq/L (ref 3.5–5.1)
Sodium: 125 meq/L — ABNORMAL LOW (ref 135–145)

## 2023-07-12 LAB — VITAMIN D 25 HYDROXY (VIT D DEFICIENCY, FRACTURES): VITD: 38.23 ng/mL (ref 30.00–100.00)

## 2023-07-12 NOTE — Assessment & Plan Note (Signed)
 Recurrent; etiology remains unclear. Lexapro  and prednisone  were considered as causative but episodes  have continued despite avoidance of both.  She had an infusion of NS 2 days ago which dropped her sodium from 130 to 125  , will avoid NS infusions and resume salt supplementation 1 gram tid

## 2023-07-16 NOTE — Assessment & Plan Note (Signed)
 Currently taking wellbutrin  XL 150 mg daily  ,  lexapro  stopped due to acute hyponatremia.  Symptoms are stable since adding abilify  2 mg daily

## 2023-07-20 ENCOUNTER — Other Ambulatory Visit (INDEPENDENT_AMBULATORY_CARE_PROVIDER_SITE_OTHER)

## 2023-07-20 DIAGNOSIS — E871 Hypo-osmolality and hyponatremia: Secondary | ICD-10-CM | POA: Diagnosis not present

## 2023-07-20 NOTE — Addendum Note (Signed)
 Addended by: Thressa Flora D on: 07/20/2023 02:44 PM   Modules accepted: Orders

## 2023-07-21 LAB — BASIC METABOLIC PANEL WITH GFR
BUN: 19 mg/dL (ref 7–25)
CO2: 27 mmol/L (ref 20–32)
Calcium: 9.2 mg/dL (ref 8.6–10.4)
Chloride: 104 mmol/L (ref 98–110)
Creat: 0.74 mg/dL (ref 0.50–1.05)
Glucose, Bld: 89 mg/dL (ref 65–99)
Potassium: 4.1 mmol/L (ref 3.5–5.3)
Sodium: 140 mmol/L (ref 135–146)
eGFR: 89 mL/min/{1.73_m2} (ref >=60)

## 2023-07-21 LAB — SODIUM, URINE, RANDOM: Sodium, Ur: 147 mmol/L (ref 28–272)

## 2023-07-24 ENCOUNTER — Ambulatory Visit: Admitting: Dermatology

## 2023-07-26 NOTE — Progress Notes (Deleted)
 Adrienne Robinson D.[facility] [address] [place] [address] Phone: [phone]   Assessment and Plan:     There are no diagnoses linked to this encounter.  ***   Pertinent previous records reviewed include ***    Follow Up: ***     Subjective:   I, Adrienne Robinson, am serving as a Neurosurgeon for Doctor Ulysees Gander  Chief Complaint: OMT   HPI:    06/21/2023 Adrienne Robinson is a 68 y.o. female coming in with complaint of back and neck pain. OMT on 05/03/2023. F/u [facility] joint pain. Patient states that she would like injection in L CMC joint.     Would like trigger point injections distal to R scapula and medial to the L scapula.     Also f/u nodules in palm of R hand beneath the 3rd and 4th fingers causing fingers to go numb.     07/06/23 Patient states here for OMT after putting a suitcase in her car she heard and felt a pop mid spine on the right side. Patient states that she feels weak and unstable.   07/27/2023 Patient states     Relevant Historical Information: Hypertension, IBS, anemia, fibromyalgia  Additional pertinent review of systems negative.   Current Outpatient Medications:    amLODipine  (NORVASC ) 10 MG tablet, Take 1 tablet (10 mg total) by mouth daily., Disp: 90 tablet, Rfl: 3   ARIPiprazole  (ABILIFY ) 2 MG tablet, TAKE 1 TABLET BY MOUTH EVERY DAY, Disp: 90 tablet, Rfl: 1   buPROPion  (WELLBUTRIN  XL) 150 MG 24 hr tablet, Take 1 tablet (150 mg total) by mouth daily., Disp: 90 tablet, Rfl: 1   cyanocobalamin  (,VITAMIN B-12,) 1000 MCG/ML injection, INJECT 1 ML (1,000 MCG TOTAL) INTO THE MUSCLE ONCE A WEEK. (Patient taking differently: Inject 1,000 mcg into the muscle every 30 (thirty) days.), Disp: 12 mL, Rfl: 6   esomeprazole  (NEXIUM ) 20 MG capsule, TAKE 1 CAPSULE (20 MG TOTAL) BY MOUTH DAILY. **PLEASE CONTACT OFFICE TO SCHEDULE FOLLOW UP, Disp: 90 capsule, Rfl: 0   famciclovir  (FAMVIR ) 500 MG tablet, Take 2 tablets (1,000  mg total) by mouth 2 (two) times daily., Disp: 4 tablet, Rfl: 1   furosemide  (LASIX ) 20 MG tablet, Take 1 tablet (20 mg total) by mouth daily with breakfast. As needed for edema, Disp: 90 tablet, Rfl: 1   HYDROcodone -acetaminophen  (NORCO) 10-325 MG tablet, Take 1 tablet by mouth every 6 (six) hours as needed., Disp: 120 tablet, Rfl: 0   HYDROcodone -acetaminophen  (NORCO) 10-325 MG tablet, Take 1-2 tablets by mouth every 6 (six) hours as needed for severe pain (pain score 7-10)., Disp: 240 tablet, Rfl: 0   Iron -Vitamin C  65-125 MG TABS, Take 1 tablet by mouth daily., Disp: 90 tablet, Rfl: 1   LINZESS  145 MCG CAPS capsule, TAKE 1 CAPSULE BY MOUTH EVERY DAY BEFORE BREAKFAST, Disp: 90 capsule, Rfl: 0   LORazepam  (ATIVAN ) 0.5 MG tablet, 1-2 tabs 30 - 60 min prior to MRI. Do not drive with this medicine., Disp: 4 tablet, Rfl: 0   meloxicam  (MOBIC ) 15 MG tablet, Take 1 tablet (15 mg total) by mouth daily., Disp: 30 tablet, Rfl: 0   metoprolol  succinate (TOPROL -XL) 25 MG 24 hr tablet, Take 1 tablet (25 mg total) by mouth daily., Disp: 90 tablet, Rfl: 1   Multiple Vitamin (MULTIVITAMIN) tablet, Take 1 tablet by mouth daily., Disp: , Rfl:    propranolol  (INDERAL ) 10 MG tablet, TAKE 1/2 TABLET BY MOUTH AS NEEDED FOR  ANXIETY, Disp: 15 tablet, Rfl: 1   Romosozumab -aqqg (EVENITY ) 105 MG/1. SOSY injection, Inject 210 mg into the skin every 30 (thirty) days., Disp: 2.4 mL, Rfl: 10   sodium chloride  1 g tablet, Take 1 tablet (1 g total) by mouth 2 (two) times daily with a meal., Disp: 60 tablet, Rfl: 2   Syringe/Needle, Disp, (SYRINGE 3CC/25GX1") 25G X 1" 3 ML MISC, Use for b12 injections, Disp: 50 each, Rfl: 0   zolpidem  (AMBIEN  CR) 12.5 MG CR tablet, TAKE 1 TABLET (12.5 MG TOTAL) BY MOUTH AT BEDTIME AS NEEDED. FOR SLEEP, Disp: 15 tablet, Rfl: 5  Current Facility-Administered Medications:    Romosozumab -aqqg (EVENITY ) 105 MG/1. injection 210 mg, 210 mg, Subcutaneous, Once, Thersia Flax, MD    Romosozumab -aqqg (EVENITY ) 105 MG/1. injection 210 mg, 210 mg, Subcutaneous, Once, Thersia Flax, MD   Romosozumab -aqqg (EVENITY ) 105 MG/1. injection 210 mg, 210 mg, Subcutaneous, Once, Thersia Flax, MD   Objective:     There were no vitals filed for this visit.    There is no height or weight on file to calculate BMI.    Physical Exam:    ***   Electronically signed by:  Marshall Skeeter D.[facility] 7:53 AM 07/26/23

## 2023-07-27 ENCOUNTER — Ambulatory Visit: Admitting: Sports Medicine

## 2023-07-27 ENCOUNTER — Encounter: Payer: Self-pay | Admitting: *Deleted

## 2023-07-28 ENCOUNTER — Other Ambulatory Visit: Payer: Self-pay | Admitting: Internal Medicine

## 2023-07-30 ENCOUNTER — Ambulatory Visit (INDEPENDENT_AMBULATORY_CARE_PROVIDER_SITE_OTHER): Admitting: Sports Medicine

## 2023-07-30 VITALS — BP 124/70 | HR 73 | Ht 63.0 in | Wt 126.0 lb

## 2023-07-30 DIAGNOSIS — M9902 Segmental and somatic dysfunction of thoracic region: Secondary | ICD-10-CM | POA: Diagnosis not present

## 2023-07-30 DIAGNOSIS — M51362 Other intervertebral disc degeneration, lumbar region with discogenic back pain and lower extremity pain: Secondary | ICD-10-CM | POA: Diagnosis not present

## 2023-07-30 DIAGNOSIS — G8929 Other chronic pain: Secondary | ICD-10-CM

## 2023-07-30 DIAGNOSIS — R29898 Other symptoms and signs involving the musculoskeletal system: Secondary | ICD-10-CM | POA: Diagnosis not present

## 2023-07-30 DIAGNOSIS — M9905 Segmental and somatic dysfunction of pelvic region: Secondary | ICD-10-CM

## 2023-07-30 DIAGNOSIS — M9903 Segmental and somatic dysfunction of lumbar region: Secondary | ICD-10-CM

## 2023-07-30 NOTE — Patient Instructions (Signed)
-   Start Tylenol  500 to 1000 mg tablets 2-3 times a day for day-to-day pain relief. - Use meloxicam  15 mg daily as needed for pain.  Recommend limiting chronic NSAIDs to 1-2 doses per week to prevent long-term side effects.  Call for meloxicam  refill if needed. Follow up with Dr. Felipe Horton.  Follow up with me as needed.

## 2023-07-30 NOTE — Progress Notes (Signed)
 Ben Makendra Vigeant D.[name] [address] [place] [address] Phone: [phone]   Assessment and Plan:     1. Chronic bilateral low back pain with bilateral sciatica 2. Weakness of both lower extremities 3. Degeneration of intervertebral disc of lumbar region with discogenic back pain and lower extremity pain 4. Somatic dysfunction of thoracic region 5. Somatic dysfunction of lumbar region 6. Somatic dysfunction of pelvic region  -Chronic with exacerbation, subsequent visit - Overall significant improvement in back pain and resolution of radicular symptoms after completing 3-week course of meloxicam .  Most consistent with flare of degenerative changes in lumbar spine leading to radicular symptoms that have since improved with conservative therapy - Reviewed patient's thoracic MRI which was unremarkable.  Reviewed lumbar MRI which showed moderate degenerative changes at L2-L3 and L3-L4, with mild changes below this level.  No evidence of compression fracture at L5.  Overall reassuring imaging - Start Tylenol  500 to 1000 mg tablets 2-3 times a day for day-to-day pain relief - Use meloxicam  15 mg daily as needed for pain.  Recommend limiting chronic NSAIDs to 1-2 doses per week to prevent long-term side effects. - Patient has received relief with OMT in the past.  Elects for repeat OMT today.  Tolerated well per note below. - Decision today to treat with OMT was based on Physical Exam  After verbal consent patient was treated with HVLA (high velocity low amplitude), ME (muscle energy), FPR (flex positional release), ST (soft tissue), PC/PD (Pelvic Compression/ Pelvic Decompression) techniques in thoracic, lumbar, and pelvic areas. Patient tolerated the procedure well with improvement in symptoms.  Patient educated on potential side effects of soreness and recommended to rest, hydrate, and use Tylenol  as needed for pain control.   Pertinent previous  records reviewed include thoracic and lumbar MRI  Follow Up: Patient has follow-up already scheduled with Dr. Felipe Horton.  Recommend keeping this appointment and could consider additional trigger point injections versus repeat OMT.  May follow-up with me as needed for breakthrough pain   Subjective:    Chief Complaint: Back pain  HPI:   06/21/23 Adrienne Robinson is a 68 y.o. female coming in with complaint of back and neck pain. OMT on 05/03/2023. F/u [facility] joint pain. Patient states that she would like injection in L [facility] joint.     Would like trigger point injections distal to R scapula and medial to the L scapula.     Also f/u nodules in palm of R hand beneath the 3rd and 4th fingers causing fingers to go numb.   07/06/23 Patient states here for OMT after putting a suitcase in her car she heard and felt a pop mid spine on the right side. Patient states that she feels weak and unstable.     07/30/23 Patient states that just want to review the imaging cd that she brought into the office. Whatever he did last time really helped because the issue is better now. The combination of medication and OMT.   Relevant Historical Information: Hypertension, IBS, anemia  Additional pertinent review of systems negative.   Current Outpatient Medications:    amLODipine  (NORVASC ) 10 MG tablet, Take 1 tablet (10 mg total) by mouth daily., Disp: 90 tablet, Rfl: 3   ARIPiprazole  (ABILIFY ) 2 MG tablet, TAKE 1 TABLET BY MOUTH EVERY DAY, Disp: 90 tablet, Rfl: 1   buPROPion  (WELLBUTRIN  XL) 150 MG 24 hr tablet, Take 1 tablet (150 mg total) by mouth daily., Disp: 90 tablet,  Rfl: 1   cyanocobalamin  (,VITAMIN B-12,) 1000 MCG/ML injection, INJECT 1 ML (1,000 MCG TOTAL) INTO THE MUSCLE ONCE A WEEK. (Patient taking differently: Inject 1,000 mcg into the muscle every 30 (thirty) days.), Disp: 12 mL, Rfl: 6   esomeprazole  (NEXIUM ) 20 MG capsule, TAKE 1 CAPSULE (20 MG TOTAL) BY MOUTH DAILY. **PLEASE CONTACT OFFICE TO SCHEDULE  FOLLOW UP, Disp: 90 capsule, Rfl: 0   famciclovir  (FAMVIR ) 500 MG tablet, Take 2 tablets (1,000 mg total) by mouth 2 (two) times daily., Disp: 4 tablet, Rfl: 1   furosemide  (LASIX ) 20 MG tablet, Take 1 tablet (20 mg total) by mouth daily with breakfast. As needed for edema, Disp: 90 tablet, Rfl: 1   HYDROcodone -acetaminophen  (NORCO) 10-325 MG tablet, Take 1 tablet by mouth every 6 (six) hours as needed., Disp: 120 tablet, Rfl: 0   HYDROcodone -acetaminophen  (NORCO) 10-325 MG tablet, Take 1-2 tablets by mouth every 6 (six) hours as needed for severe pain (pain score 7-10)., Disp: 240 tablet, Rfl: 0   Iron -Vitamin C  65-125 MG TABS, Take 1 tablet by mouth daily., Disp: 90 tablet, Rfl: 1   LINZESS  145 MCG CAPS capsule, TAKE 1 CAPSULE BY MOUTH EVERY DAY BEFORE BREAKFAST, Disp: 90 capsule, Rfl: 0   LORazepam  (ATIVAN ) 0.5 MG tablet, 1-2 tabs 30 - 60 min prior to MRI. Do not drive with this medicine., Disp: 4 tablet, Rfl: 0   meloxicam  (MOBIC ) 15 MG tablet, Take 1 tablet (15 mg total) by mouth daily., Disp: 30 tablet, Rfl: 0   metoprolol  succinate (TOPROL -XL) 25 MG 24 hr tablet, Take 1 tablet (25 mg total) by mouth daily., Disp: 90 tablet, Rfl: 1   Multiple Vitamin (MULTIVITAMIN) tablet, Take 1 tablet by mouth daily., Disp: , Rfl:    propranolol  (INDERAL ) 10 MG tablet, TAKE 1/2 TABLET BY MOUTH AS NEEDED FOR ANXIETY, Disp: 15 tablet, Rfl: 1   Romosozumab -aqqg (EVENITY ) 105 MG/1. SOSY injection, Inject 210 mg into the skin every 30 (thirty) days., Disp: 2.4 mL, Rfl: 10   sodium chloride  1 g tablet, Take 1 tablet (1 g total) by mouth 2 (two) times daily with a meal., Disp: 60 tablet, Rfl: 2   Syringe/Needle, Disp, (SYRINGE 3CC/25GX1") 25G X 1" 3 ML MISC, Use for b12 injections, Disp: 50 each, Rfl: 0   zolpidem  (AMBIEN  CR) 12.5 MG CR tablet, TAKE 1 TABLET (12.5 MG TOTAL) BY MOUTH AT BEDTIME AS NEEDED. FOR SLEEP, Disp: 15 tablet, Rfl: 5  Current Facility-Administered Medications:    Romosozumab -aqqg  (EVENITY ) 105 MG/1. injection 210 mg, 210 mg, Subcutaneous, Once, Thersia Flax, MD   Romosozumab -aqqg (EVENITY ) 105 MG/1. injection 210 mg, 210 mg, Subcutaneous, Once, Thersia Flax, MD   Romosozumab -aqqg (EVENITY ) 105 MG/1. injection 210 mg, 210 mg, Subcutaneous, Once, Thersia Flax, MD   Objective:     Vitals:   07/30/23 1421  BP: 124/70  Pulse: 73  SpO2: 96%  Weight: 126 lb (57.2 kg)  Height: 5\' 3"  (1.6 m)      Body mass index is 22.32 kg/m.    Physical Exam:     Gen: Appears well, nad, nontoxic and pleasant Psych: Alert and oriented, appropriate mood and affect Neuro: sensation intact, strength is 5/5 in bilateral lower extremities.  Muscle tone wnl Skin: no susupicious lesions or rashes   Back - Normal skin, Spine with normal alignment and no deformity.     tenderness to lumbar vertebral process palpation.   Bilateral lumbar paraspinous muscles are   tender and without spasm  NTTP gluteal musculature Straight leg raise negative bilaterally  Gait antalgic with slow steps  General: Well-appearing, cooperative, sitting comfortably in no acute distress.   OMT Physical Exam:  ASIS Compression Test: Positive Right  Thoracic: TTP paraspinal, T8-11 RRSL Lumbar: TTP paraspinal, L2 RLSL Pelvis: Right anterior innominate   Electronically signed by:  Marshall Skeeter D.[name] 2:59 PM 07/30/23

## 2023-07-30 NOTE — Progress Notes (Deleted)
 error

## 2023-07-31 ENCOUNTER — Ambulatory Visit: Admitting: Dermatology

## 2023-07-31 ENCOUNTER — Encounter: Payer: Self-pay | Admitting: Dermatology

## 2023-07-31 DIAGNOSIS — L910 Hypertrophic scar: Secondary | ICD-10-CM | POA: Diagnosis not present

## 2023-07-31 DIAGNOSIS — L578 Other skin changes due to chronic exposure to nonionizing radiation: Secondary | ICD-10-CM

## 2023-07-31 DIAGNOSIS — L853 Xerosis cutis: Secondary | ICD-10-CM

## 2023-07-31 DIAGNOSIS — Z1283 Encounter for screening for malignant neoplasm of skin: Secondary | ICD-10-CM

## 2023-07-31 DIAGNOSIS — L820 Inflamed seborrheic keratosis: Secondary | ICD-10-CM | POA: Diagnosis not present

## 2023-07-31 DIAGNOSIS — D1801 Hemangioma of skin and subcutaneous tissue: Secondary | ICD-10-CM

## 2023-07-31 DIAGNOSIS — Z85828 Personal history of other malignant neoplasm of skin: Secondary | ICD-10-CM

## 2023-07-31 DIAGNOSIS — D229 Melanocytic nevi, unspecified: Secondary | ICD-10-CM

## 2023-07-31 DIAGNOSIS — W908XXA Exposure to other nonionizing radiation, initial encounter: Secondary | ICD-10-CM

## 2023-07-31 DIAGNOSIS — D692 Other nonthrombocytopenic purpura: Secondary | ICD-10-CM

## 2023-07-31 DIAGNOSIS — L814 Other melanin hyperpigmentation: Secondary | ICD-10-CM

## 2023-07-31 DIAGNOSIS — L821 Other seborrheic keratosis: Secondary | ICD-10-CM

## 2023-07-31 DIAGNOSIS — L565 Disseminated superficial actinic porokeratosis (DSAP): Secondary | ICD-10-CM

## 2023-07-31 NOTE — Patient Instructions (Addendum)
 Cryotherapy Aftercare  Wash gently with soap and water everyday.   Apply Vaseline and Band-Aid daily until healed.    Resume Cholesterol 2% Lovastatin 2% Cream 240 gm- Apply twice daily as directed to affected areas arms and legs.     Recommend starting moisturizer with exfoliant (Urea , Salicylic acid, or Lactic acid) one to two times daily to help smooth rough and bumpy skin.  OTC options include Cetaphil Rough and Bumpy lotion (Urea ), Eucerin Roughness Relief lotion or spot treatment cream (Urea ), CeraVe SA lotion/cream for Rough and Bumpy skin (Sal Acid), Gold Bond Rough and Bumpy cream (Sal Acid), and AmLactin 12% lotion/cream (Lactic Acid).  If applying in morning, also apply sunscreen to sun-exposed areas, since these exfoliating moisturizers can increase sensitivity to sun.     Pre-Operative Instructions  You are scheduled for a surgical procedure at [facility] We recommend you read the following instructions. If you have any questions or concerns, please call the office at [phone].  Shower and wash the entire body with soap and water the day of your surgery paying special attention to cleansing at and around the planned surgery site.  Avoid aspirin  or aspirin  containing products at least fourteen (14) days prior to your surgical procedure and for at least one week (7 Days) after your surgical procedure. If you take aspirin  on a regular basis for heart disease or history of stroke or for any other reason, we may recommend you continue taking aspirin  but please notify us  if you take this on a regular basis. Aspirin  can cause more bleeding to occur during surgery as well as prolonged bleeding and bruising after surgery.   Avoid other nonsteroidal pain medications at least one week prior to surgery and at least one week prior to your surgery. These include medications such as Ibuprofen  (Motrin , Advil  and Nuprin ), Naprosyn, Voltaren, Relafen, etc. If medications are used for  therapeutic reasons, please inform us  as they can cause increased bleeding or prolonged bleeding during and bruising after surgical procedures.   Please advise us  if you are taking any "blood thinner" medications such as Coumadin or Dipyridamole or Plavix or similar medications. These cause increased bleeding and prolonged bleeding during procedures and bruising after surgical procedures. We may have to consider discontinuing these medications briefly prior to and shortly after your surgery if safe to do so.   Please inform us  of all medications you are currently taking. All medications that are taken regularly should be taken the day of surgery as you always do. Nevertheless, we need to be informed of what medications you are taking prior to surgery to know whether they will affect the procedure or cause any complications.   Please inform us  of any medication allergies. Also inform us  of whether you have allergies to Latex or rubber products or whether you have had any adverse reaction to Lidocaine  or Epinephrine .  Please inform us  of any prosthetic or artificial body parts such as artificial heart valve, joint replacements, etc., or similar condition that might require preoperative antibiotics.   We recommend avoidance of alcohol at least two weeks prior to surgery and continued avoidance for at least two weeks after surgery.   We recommend discontinuation of tobacco smoking at least two weeks prior to surgery and continued abstinence for at least two weeks after surgery.  Do not plan strenuous exercise, strenuous work or strenuous lifting for approximately four weeks after your surgery.   We request if you are unable to make your scheduled  surgical appointment, please call us  at least a week in advance or as soon as you are aware of a problem so that we can cancel or reschedule the appointment.   You MAY TAKE TYLENOL  (acetaminophen ) for pain as it is not a blood thinner.   PLEASE PLAN TO BE IN  TOWN FOR TWO WEEKS FOLLOWING SURGERY, THIS IS IMPORTANT SO YOU CAN BE CHECKED FOR DRESSING CHANGES, SUTURE REMOVAL AND TO MONITOR FOR POSSIBLE COMPLICATIONS.    Recommend daily broad spectrum sunscreen SPF 30+ to sun-exposed areas, reapply every 2 hours as needed. Call for new or changing lesions.  Staying in the shade or wearing long sleeves, sun glasses (UVA+UVB protection) and wide brim hats (4-inch brim around the entire circumference of the hat) are also recommended for sun protection.     Melanoma ABCDEs  Melanoma is the most dangerous type of skin cancer, and is the leading cause of death from skin disease.  You are more likely to develop melanoma if you: Have light-colored skin, light-colored eyes, or red or blond hair Spend a lot of time in the sun Tan regularly, either outdoors or in a tanning bed Have had blistering sunburns, especially during childhood Have a close family member who has had a melanoma Have atypical moles or large birthmarks  Early detection of melanoma is key since treatment is typically straightforward and cure rates are extremely high if we catch it early.   The first sign of melanoma is often a change in a mole or a new dark spot.  The ABCDE system is a way of remembering the signs of melanoma.  A for asymmetry:  The two halves do not match. B for border:  The edges of the growth are irregular. C for color:  A mixture of colors are present instead of an even brown color. D for diameter:  Melanomas are usually (but not always) greater than 6mm - the size of a pencil eraser. E for evolution:  The spot keeps changing in size, shape, and color.  Please check your skin once per month between visits. You can use a small mirror in front and a large mirror behind you to keep an eye on the back side or your body.   If you see any new or changing lesions before your next follow-up, please call to schedule a visit.  Please continue daily skin protection including  broad spectrum sunscreen SPF 30+ to sun-exposed areas, reapplying every 2 hours as needed when you're outdoors.   Staying in the shade or wearing long sleeves, sun glasses (UVA+UVB protection) and wide brim hats (4-inch brim around the entire circumference of the hat) are also recommended for sun protection.       Due to recent changes in healthcare laws, you may see results of your pathology and/or laboratory studies on MyChart before the doctors have had a chance to review them. We understand that in some cases there may be results that are confusing or concerning to you. Please understand that not all results are received at the same time and often the doctors may need to interpret multiple results in order to provide you with the best plan of care or course of treatment. Therefore, we ask that you please give us  2 business days to thoroughly review all your results before contacting the office for clarification. Should we see a critical lab result, you will be contacted sooner.   If You Need Anything After Your Visit  If you have any questions  or concerns for your doctor, please call our main line at [phone] and press option 4 to reach your doctor's medical assistant. If no one answers, please leave a voicemail as directed and we will return your call as soon as possible. Messages left after 4 pm will be answered the following business day.   You may also send us  a message via MyChart. We typically respond to MyChart messages within 1-2 business days.  For prescription refills, please ask your pharmacy to contact our office. Our fax number is [phone].  If you have an urgent issue when the clinic is closed that cannot wait until the next business day, you can page your doctor at the number below.    Please note that while we do our best to be available for urgent issues outside of office hours, we are not available 24/7.   If you have an urgent issue and are unable to reach us , you  may choose to seek medical care at your doctor's office, retail clinic, urgent care center, or emergency room.  If you have a medical emergency, please immediately call 911 or go to the emergency department.  Pager Numbers  - Dr. Bary Likes: [phone]  - Dr. Annette Barters: [phone]  - Dr. Felipe Horton: [phone]   In the event of inclement weather, please call our main line at [phone] for an update on the status of any delays or closures.  Dermatology Medication Tips: Please keep the boxes that topical medications come in in order to help keep track of the instructions about where and how to use these. Pharmacies typically print the medication instructions only on the boxes and not directly on the medication tubes.   If your medication is too expensive, please contact our office at [phone] option 4 or send us  a message through MyChart.   We are unable to tell what your co-pay for medications will be in advance as this is different depending on your insurance coverage. However, we may be able to find a substitute medication at lower cost or fill out paperwork to get insurance to cover a needed medication.   If a prior authorization is required to get your medication covered by your insurance company, please allow us  1-2 business days to complete this process.  Drug prices often vary depending on where the prescription is filled and some pharmacies may offer cheaper prices.  The website www.goodrx.com contains coupons for medications through different pharmacies. The prices here do not account for what the cost may be with help from insurance (it may be cheaper with your insurance), but the website can give you the price if you did not use any insurance.  - You can print the associated coupon and take it with your prescription to the pharmacy.  - You may also stop by our office during regular business hours and pick up a GoodRx coupon card.  - If you need your prescription sent  electronically to a different pharmacy, notify our office through [facility] or by phone at [phone] option 4.     Si Usted Necesita Algo Despus de Su Visita  Tambin puede enviarnos un mensaje a travs de Clinical cytogeneticist. Por lo general respondemos a los mensajes de MyChart en el transcurso de 1 a 2 das hbiles.  Para renovar recetas, por favor pida a su farmacia que se ponga en contacto con nuestra oficina. Franz Jacks de fax es [place] [phone].  Si tiene un asunto urgente cuando la clnica est cerrada y que no  puede esperar hasta el siguiente da hbil, puede llamar/localizar a su doctor(a) al nmero que aparece a continuacin.   Por favor, tenga en cuenta que aunque hacemos todo lo posible para estar disponibles para asuntos urgentes fuera del horario de [place], no estamos disponibles las 24 horas del da, los 7 [address]  [address] de la [place].   Si tiene un problema urgente y no puede comunicarse con nosotros, puede optar por buscar atencin mdica  en el consultorio de su doctor(a), en una clnica privada, en un centro de atencin urgente o en una sala de emergencias.  Si tiene Engineer, drilling, por favor llame inmediatamente al 911 o vaya a la sala de emergencias.  Nmeros de bper  - Dr. Bary Likes: [phone]  - Dra. Annette Barters: [phone]  - Dr. Felipe Horton: [phone]   En caso de inclemencias del tiempo, por favor llame a Lajuan Pila principal al [phone] para una actualizacin sobre el [place] de cualquier retraso o cierre.  Consejos para la medicacin en dermatologa: Por favor, guarde las cajas en las que vienen los medicamentos de uso tpico para ayudarle a seguir las instrucciones sobre dnde y cmo usarlos. Las farmacias generalmente imprimen las instrucciones del medicamento slo en las cajas y no directamente en los tubos del [place].   Si su medicamento es muy caro, por favor, pngase en contacto con Bettyjane Brunet llamando al [phone] y presione la  opcin 4 o envenos un mensaje a travs de Clinical cytogeneticist.   No podemos decirle cul ser su copago por los medicamentos por adelantado ya que esto es diferente dependiendo de la cobertura de su seguro. Sin embargo, es posible que podamos encontrar un medicamento sustituto a Audiological scientist un formulario para que el seguro cubra el medicamento que se considera necesario.   Si se requiere una autorizacin previa para que su compaa de seguros [place] su medicamento, por favor permtanos de 1 a 2 das hbiles para completar este proceso.  Los precios de los medicamentos varan con frecuencia dependiendo del Environmental consultant de dnde se surte la receta y alguna farmacias pueden ofrecer precios ms baratos.  El sitio web www.goodrx.com tiene cupones para medicamentos de Health and safety inspector. Los precios aqu no tienen en cuenta lo que podra costar con la ayuda del seguro (puede ser ms barato con su seguro), pero el sitio web puede darle el precio si no utiliz Tourist information centre manager.  - Puede imprimir el cupn correspondiente y llevarlo con su receta a la farmacia.  - Tambin puede pasar por nuestra oficina durante el horario de atencin regular y Education officer, museum una tarjeta de cupones de GoodRx.  - Si necesita que su receta se enve electrnicamente a una farmacia diferente, informe a nuestra oficina a travs de MyChart de [place] o por telfono llamando al [phone] y presione la opcin 4.

## 2023-07-31 NOTE — Progress Notes (Signed)
 Follow-Up Visit   Subjective  Adrienne Robinson is a 68 y.o. female who presents for the following: Skin Cancer Screening and Full Body Skin Exam. Hx of BCC. Hx of SCC. Hx of AKs.  Spot of concern on left thigh and scalp. Lesion on scalp is larger. Roda Cirri is concerned.  The patient presents for Total-Body Skin Exam (TBSE) for skin cancer screening and mole check. The patient has spots, moles and lesions to be evaluated, some may be new or changing and the patient may have concern these could be cancer.    The following portions of the chart were reviewed this encounter and updated as appropriate: medications, allergies, medical history  Review of Systems:  No other skin or systemic complaints except as noted in HPI or Assessment and Plan.  Objective  Well appearing patient in no apparent distress; mood and affect are within normal limits.  A full examination was performed including scalp, head, eyes, ears, nose, lips, neck, chest, axillae, abdomen, back, buttocks, bilateral upper extremities, bilateral lower extremities, hands, feet, fingers, toes, fingernails, and toenails. All findings within normal limits unless otherwise noted below.   Relevant physical exam findings are noted in the Assessment and Plan.  Vertex Scalp (clustered papules/plaque)x1, L ant thigh x1, L lateral ankle x1, R ant thigh x1, R popliteal x1, R upper calf x1, L calf x1 (7) Waxy pink tan scaly papules  Assessment & Plan   SKIN CANCER SCREENING PERFORMED TODAY.  HISTORY OF SQUAMOUS CELL CARCINOMA OF THE SKIN - No evidence of recurrence today - Recommend regular full body skin exams - Recommend daily broad spectrum sunscreen SPF 30+ to sun-exposed areas, reapply every 2 hours as needed.  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  Exam: 10 x 6 mm pink firm nodule  Benign-appearing.   Call clinic for new or changing lesions. Don't recommend option of intralesional triamcinolone  due to nature of SCC (poorly differentiated).   Treatment Plan: Will schedule excision of lesion with repeat pathology to treat hypertrophic scar and r/o recurrence SCC. Pre-op instructions given.  HISTORY OF BASAL CELL CARCINOMA OF THE SKIN - No evidence of recurrence today - Recommend regular full body skin exams - Recommend daily broad spectrum sunscreen SPF 30+ to sun-exposed areas, reapply every 2 hours as needed.  - Call if any new or changing lesions are noted between office visits    ACTINIC DAMAGE - Chronic condition, secondary to cumulative UV/sun exposure - diffuse scaly erythematous macules with underlying dyspigmentation - Recommend daily broad spectrum sunscreen SPF 30+ to sun-exposed areas, reapply every 2 hours as needed.  - Staying in the shade or wearing long sleeves, sun glasses (UVA+UVB protection) and wide brim hats (4-inch brim around the entire circumference of the hat) are also recommended for sun protection.  - Call for new or changing lesions.  LENTIGINES, SEBORRHEIC KERATOSES, HEMANGIOMAS - Benign normal skin lesions - Benign-appearing - Call for any changes  MELANOCYTIC NEVI - Tan-brown and/or pink-flesh-colored symmetric macules and papules - Benign appearing on exam today - Observation - Call clinic for new or changing moles - Recommend daily use of broad spectrum spf 30+ sunscreen to sun-exposed areas.    Disseminated superficial actinic porokeratosis (DSAP) legs- pink/tan scaly macules with keratotic rim   Chronic and persistent condition with duration or expected duration over one year. Condition is symptomatic / bothersome to patient. Not  to goal.   DSAP is a chronic inherited condition of sun-exposed skin, most commonly affecting the arms and legs.  It is difficult to treat.  Recommend photoprotection and regular use of spf 30 or higher sunscreen to  prevent worsening of condition and precancerous changes.   Resume Cholesterol 2% Lovastatin 2% Cream 240 gm- Apply twice daily as directed to affected areas arms and legs.      LENTIGINES Exam: scattered tan macules at face Due to sun exposure Treatment Plan: Benign-appearing, observe. Recommend daily broad spectrum sunscreen SPF 30+ to sun-exposed areas, reapply every 2 hours as needed.  Call for any changes  Xerosis - diffuse xerotic patches - recommend gentle, hydrating skin care - gentle skin care handout given  Recommend starting moisturizer with exfoliant (Urea , Salicylic acid, or Lactic acid) one to two times daily to help smooth rough and bumpy skin.  OTC options include Cetaphil Rough and Bumpy lotion (Urea ), Eucerin Roughness Relief lotion or spot treatment cream (Urea ), CeraVe SA lotion/cream for Rough and Bumpy skin (Sal Acid), Gold Bond Rough and Bumpy cream (Sal Acid), and AmLactin 12% lotion/cream (Lactic Acid).  If applying in morning, also apply sunscreen to sun-exposed areas, since these exfoliating moisturizers can increase sensitivity to sun.  Purpura - Chronic; persistent and recurrent.  Treatable, but not curable. - Violaceous macules and patches at arms - Benign - Related to trauma, age, sun damage and/or use of blood thinners, chronic use of topical and/or oral steroids - Observe - Can use OTC arnica containing moisturizer such as Dermend Bruise Formula if desired - Call for worsening or other concerns   INFLAMED SEBORRHEIC KERATOSIS (7) Vertex Scalp (clustered papules/plaque)x1, L ant thigh x1, L lateral ankle x1, R ant thigh x1, R popliteal x1, R upper calf x1, L calf x1 (7) vs DSAP at L lateral ankle. Recheck on follow up. Recheck vertex scalp on follow up.  Symptomatic, irritating, patient would like treated.    Destruction of lesion - Vertex Scalp (clustered papules/plaque)x1, L ant thigh x1, L lateral ankle x1, R ant thigh x1, R popliteal x1, R upper  calf x1, L calf x1 (7)  Destruction method: cryotherapy   Informed consent: discussed and consent obtained   Lesion destroyed using liquid nitrogen: Yes   Region frozen until ice ball extended beyond lesion: Yes   Outcome: patient tolerated procedure well with no complications   Post-procedure details: wound care instructions given   Additional details:  Prior to procedure, discussed risks of blister formation, small wound, skin dyspigmentation, or rare scar following cryotherapy. Recommend Vaseline ointment to treated areas while healing.  Return for Surgery as scheduled .  I, Jill Parcell, CMA, am acting as scribe for Artemio Larry, MD.   Documentation: I have reviewed the above documentation for accuracy and completeness, and I agree with the above.  Artemio Larry, MD

## 2023-08-02 ENCOUNTER — Encounter: Payer: Self-pay | Admitting: *Deleted

## 2023-08-02 ENCOUNTER — Other Ambulatory Visit: Payer: Self-pay | Admitting: Sports Medicine

## 2023-08-06 ENCOUNTER — Telehealth: Payer: Self-pay

## 2023-08-06 ENCOUNTER — Ambulatory Visit

## 2023-08-06 NOTE — Telephone Encounter (Signed)
 Spoke with pt and she wanted to make sure that we were aware that we had all the information that was needed so that her insurance would cover her Evenity .

## 2023-08-06 NOTE — Telephone Encounter (Signed)
 Copied from CRM [id number]. Topic: Medical Record Request - Payor/Billing Request >> Aug 06, 2023  9:18 AM Lizabeth Riggs wrote: Reason for CRM:  Adrienne Robinson would like the billing department at the clinic to call her today about Evenity  medication to make sure billing department has everything needed so insurance will pay. Her number is [phone].

## 2023-08-07 ENCOUNTER — Ambulatory Visit

## 2023-08-08 ENCOUNTER — Ambulatory Visit (INDEPENDENT_AMBULATORY_CARE_PROVIDER_SITE_OTHER)

## 2023-08-08 DIAGNOSIS — M810 Age-related osteoporosis without current pathological fracture: Secondary | ICD-10-CM | POA: Diagnosis not present

## 2023-08-08 MED ORDER — ROMOSOZUMAB-AQQG 105 MG/1.17ML ~~LOC~~ SOSY
210.0000 mg | PREFILLED_SYRINGE | Freq: Once | SUBCUTANEOUS | Status: AC
Start: 2023-08-08 — End: 2023-08-08
  Administered 2023-08-08: 210 mg via SUBCUTANEOUS

## 2023-08-08 NOTE — Progress Notes (Signed)
 Pt received Evenity  injections in pt left and right arm. Pt tolerated it well with no complaints or concerns.

## 2023-08-09 ENCOUNTER — Other Ambulatory Visit (HOSPITAL_COMMUNITY): Payer: Self-pay

## 2023-08-13 ENCOUNTER — Other Ambulatory Visit: Payer: Self-pay | Admitting: Sports Medicine

## 2023-08-13 NOTE — Telephone Encounter (Signed)
 I received a faxed from [facility] today requesting a copy of her insurance card. It has been faxed.

## 2023-08-15 ENCOUNTER — Ambulatory Visit (INDEPENDENT_AMBULATORY_CARE_PROVIDER_SITE_OTHER): Admitting: Dermatology

## 2023-08-15 DIAGNOSIS — D045 Carcinoma in situ of skin of trunk: Secondary | ICD-10-CM | POA: Diagnosis not present

## 2023-08-15 DIAGNOSIS — C44529 Squamous cell carcinoma of skin of other part of trunk: Secondary | ICD-10-CM

## 2023-08-15 DIAGNOSIS — D492 Neoplasm of unspecified behavior of bone, soft tissue, and skin: Secondary | ICD-10-CM

## 2023-08-15 DIAGNOSIS — D485 Neoplasm of uncertain behavior of skin: Secondary | ICD-10-CM

## 2023-08-15 MED ORDER — ROMOSOZUMAB-AQQG 105 MG/1.17ML ~~LOC~~ SOSY: 210.0000 mg | PREFILLED_SYRINGE | SUBCUTANEOUS | 10 refills | Status: AC

## 2023-08-15 NOTE — Telephone Encounter (Signed)
 I received another fax from [facility] today suggestion that we send her Evenity  Rx to [facility].  The last time we tried [facility] the pt received a $31,000 bill.  However, I have resent it to [facility] and will have pt reach out to [facility] to provide additional info.

## 2023-08-15 NOTE — Addendum Note (Signed)
 Addended by: BRIEN SHARENE RAMAN on: 08/15/2023 10:18 AM   Modules accepted: Orders

## 2023-08-15 NOTE — Progress Notes (Signed)
   Follow-Up Visit   Subjective  Adrienne Robinson is a 68 y.o. 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 EDC performed 02/28/2023.  May have recurred.   The following portions of the chart were reviewed this encounter and updated as appropriate: medications, allergies, medical history  Review of Systems:  No other skin or systemic complaints except as noted in HPI or Assessment and Plan.  Objective  Well appearing patient in no apparent distress; mood and affect are within normal limits.  A focused examination was performed of the following areas: Chest  Relevant physical exam findings are noted in the Assessment and Plan.  Mid sternum 1.1 x 0.7 cm pink firm nodule   Assessment & Plan   NEOPLASM OF UNCERTAIN BEHAVIOR OF SKIN Mid sternum Skin excision  Lesion length (cm):  1.1 Lesion width (cm):  0.7 Margin per side (cm):  0.2 Total excision diameter (cm):  1.5 Informed consent: discussed and consent obtained   Timeout: patient name, date of birth, surgical site, and procedure verified   Procedure prep:  Patient was prepped and draped in usual sterile fashion Prep type:  Povidone-iodine Anesthesia: the lesion was anesthetized in a standard fashion   Anesthetic:  1% lidocaine  w/ epinephrine  1-100,000 buffered w/ 8.4% NaHCO3 (Total 13 cc - 7 cc lido w/epi, 6cc bupicivaine) Instrument used: #15 blade   Hemostasis achieved with: pressure and electrodesiccation   Outcome: patient tolerated procedure well with no complications   Additional details:  Tagged at superior 12 o'clock   Skin repair Complexity:  Intermediate Final length (cm):  3.8 Informed consent: discussed and consent obtained   Reason for type of repair: reduce tension to allow closure, reduce the risk of dehiscence, infection, and necrosis, reduce subcutaneous dead space and avoid a hematoma, preserve normal anatomical and functional relationships and enhance both  functionality and cosmetic results   Undermining: edges undermined   Subcutaneous layers (deep stitches):  Suture size:  3-0 Suture type: Vicryl (polyglactin 910)   Stitches:  Buried vertical mattress Fine/surface layer approximation (top stitches):  Suture size:  4-0 Suture type: nylon   Stitches: simple interrupted   Suture removal (days):  7 Hemostasis achieved with: suture and electrodesiccation Outcome: patient tolerated procedure well with no complications   Post-procedure details: sterile dressing applied and wound care instructions given   Dressing type: pressure dressing (mupirocin )   Specimen 1 - Surgical pathology Differential Diagnosis: Poorly Differentiated SCC, biopsy proven Check Margins: Yes [id number]  Tagged at superior 12 o'clock  Biopsy proven poorly differentiated SCC. [facility] 02/28/2023.   Return as scheduled, for suture removal.  I, Andrea Kerns, CMA, am acting as scribe for Rexene Rattler, MD .   Documentation: I have reviewed the above documentation for accuracy and completeness, and I agree with the above.  Rexene Rattler, MD

## 2023-08-15 NOTE — Patient Instructions (Addendum)
 Recommend Serica moisturizing scar formula cream every night or [facility] brand or Mederma silicone scar sheet every night for the first year after a scar appears to help with scar remodeling if desired. Scars remodel on their own for a full year and will gradually improve in appearance over time.   Wound Care Instructions for After Surgery  On the day following your surgery, you should begin doing daily dressing changes until your sutures are removed: Remove the bandage. Cleanse the wound gently with soap and water.  Make sure you then dry the skin surrounding the wound completely or the tape will not stick to the skin. Do not use cotton balls on the wound. After the wound is clean and dry, apply the ointment (either prescription antibiotic prescribed by your doctor or plain Vaseline if nothing was prescribed) gently with a Q-tip. If you are using a bandaid to cover: Apply a bandaid large enough to cover the entire wound. If you do not have a bandaid large enough to cover the wound OR if you are sensitive to bandaid adhesive: Cut a non-stick pad (such as Telfa) to fit the size of the wound.  Cover the wound with the non-stick pad. If the wound is draining, you may want to add a small amount of gauze on top of the non-stick pad for a little added compression to the area. Use tape to seal the area completely.  For the next 1-2 weeks: Be sure to keep the wound moist with ointment 24/7 to ensure best healing. If you are unable to cover the wound with a bandage to hold the ointment in place, you may need to reapply the ointment several times a day. Do not bend over or lift heavy items to reduce the chance of elevated blood pressure to the wound. Do not participate in particularly strenuous activities.  Below is a list of dressing supplies you might need.  Cotton-tipped applicators - Q-tips Gauze pads (2x2 and/or 4x4) - All-Purpose Sponges New and clean tube of petroleum jelly (Vaseline) OR  prescription antibiotic ointment if prescribed Either a bandaid large enough to cover the entire wound OR non-stick dressing material (Telfa) and Tape (Paper or Hypafix)  FOR ADULT SURGERY PATIENTS: If you need something for pain relief, you may take 1 extra strength Tylenol  (acetaminophen ) and 2 ibuprofen  (200 mg) together every 4 hours as needed. (Do not take these medications if you are allergic to them or if you know you cannot take them for any other reason). Typically you may only need pain medication for 1-3 days.   Comments on the Post-Operative Period Slight swelling and redness often appear around the wound. This is normal and will disappear within several days following the surgery. The healing wound will drain a brownish-red-yellow discharge during healing. This is a normal phase of wound healing. As the wound begins to heal, the drainage may increase in amount. Again, this drainage is normal. Notify us  if the drainage becomes persistently bloody, excessively swollen, or intensely painful or develops a foul odor or red streaks.  The healing wound will also typically be itchy. This is normal. If you have severe or persistent pain, Notify us  if the discomfort is severe or persistent. Avoid alcoholic beverages when taking pain medicine.  In Case of Wound Hemorrhage A wound hemorrhage is when the bandage suddenly becomes soaked with bright red blood and flows profusely. If this happens, sit down or lie down with your head elevated. If the wound has a dressing on  it, do not remove the dressing. Apply pressure to the existing gauze. If the wound is not covered, use a gauze pad to apply pressure and continue applying the pressure for 20 minutes without peeking. DO NOT COVER THE WOUND WITH A LARGE TOWEL OR WASH CLOTH. Release your hand from the wound site but do not remove the dressing. If the bleeding has stopped, gently clean around the wound. Leave the dressing in place for 24 hours if possible.  This wait time allows the blood vessels to close off so that you do not spark a new round of bleeding by disrupting the newly clotted blood vessels with an immediate dressing change. If the bleeding does not subside, continue to hold pressure for 40 minutes. If bleeding continues, page your physician, contact an After Hours clinic or go to the Emergency Room.   Due to recent changes in healthcare laws, you may see results of your pathology and/or laboratory studies on MyChart before the doctors have had a chance to review them. We understand that in some cases there may be results that are confusing or concerning to you. Please understand that not all results are received at the same time and often the doctors may need to interpret multiple results in order to provide you with the best plan of care or course of treatment. Therefore, we ask that you please give us  2 business days to thoroughly review all your results before contacting the office for clarification. Should we see a critical lab result, you will be contacted sooner.   If You Need Anything After Your Visit  If you have any questions or concerns for your doctor, please call our main line at [phone] and press option 4 to reach your doctor's medical assistant. If no one answers, please leave a voicemail as directed and we will return your call as soon as possible. Messages left after 4 pm will be answered the following business day.   You may also send us  a message via MyChart. We typically respond to MyChart messages within 1-2 business days.  For prescription refills, please ask your pharmacy to contact our office. Our fax number is [phone].  If you have an urgent issue when the clinic is closed that cannot wait until the next business day, you can page your doctor at the number below.    Please note that while we do our best to be available for urgent issues outside of office hours, we are not available 24/7.   If you have an  urgent issue and are unable to reach us , you may choose to seek medical care at your doctor's office, retail clinic, urgent care center, or emergency room.  If you have a medical emergency, please immediately call 911 or go to the emergency department.  Pager Numbers  - Dr. Hester: [phone]  - Dr. Jackquline: [phone]  - Dr. Claudene: [phone]   In the event of inclement weather, please call our main line at [phone] for an update on the status of any delays or closures.  Dermatology Medication Tips: Please keep the boxes that topical medications come in in order to help keep track of the instructions about where and how to use these. Pharmacies typically print the medication instructions only on the boxes and not directly on the medication tubes.   If your medication is too expensive, please contact our office at [phone] option 4 or send us  a message through MyChart.   We are unable to tell what your co-pay  for medications will be in advance as this is different depending on your insurance coverage. However, we may be able to find a substitute medication at lower cost or fill out paperwork to get insurance to cover a needed medication.   If a prior authorization is required to get your medication covered by your insurance company, please allow us  1-2 business days to complete this process.  Drug prices often vary depending on where the prescription is filled and some pharmacies may offer cheaper prices.  The website www.goodrx.com contains coupons for medications through different pharmacies. The prices here do not account for what the cost may be with help from insurance (it may be cheaper with your insurance), but the website can give you the price if you did not use any insurance.  - You can print the associated coupon and take it with your prescription to the pharmacy.  - You may also stop by our office during regular business hours and pick up a GoodRx coupon card.   - If you need your prescription sent electronically to a different pharmacy, notify our office through [facility] or by phone at [phone] option 4.     Si Usted Necesita Algo Despus de Su Visita  Tambin puede enviarnos un mensaje a travs de Clinical cytogeneticist. Por lo general respondemos a los mensajes de MyChart en el transcurso de 1 a 2 das hbiles.  Para renovar recetas, por favor pida a su farmacia que se ponga en contacto con nuestra oficina. Randi lakes de fax es [place] [phone].  Si tiene un asunto urgente cuando la clnica est cerrada y que no puede esperar hasta el siguiente da hbil, puede llamar/localizar a su doctor(a) al nmero que aparece a continuacin.   Por favor, tenga en cuenta que aunque hacemos todo lo posible para estar disponibles para asuntos urgentes fuera del horario de [place], no estamos disponibles las 24 horas del da, los 7 [address]  [address] de la [place].   Si tiene un problema urgente y no puede comunicarse con nosotros, puede optar por buscar atencin mdica  en el consultorio de su doctor(a), en una clnica privada, en un centro de atencin urgente o en una sala de emergencias.  Si tiene Engineer, drilling, por favor llame inmediatamente al 911 o vaya a la sala de emergencias.  Nmeros de bper  - Dr. Hester: [phone]  - Dra. Jackquline: [phone]  - Dr. Claudene: [phone]   En caso de inclemencias del tiempo, por favor llame a landry capes principal al [phone] para una actualizacin sobre el [place] de cualquier retraso o cierre.  Consejos para la medicacin en dermatologa: Por favor, guarde las cajas en las que vienen los medicamentos de uso tpico para ayudarle a seguir las instrucciones sobre dnde y cmo usarlos. Las farmacias generalmente imprimen las instrucciones del medicamento slo en las cajas y no directamente en los tubos del [place].   Si su medicamento es muy caro, por favor, pngase en contacto con landry rieger  llamando al [phone] y presione la opcin 4 o envenos un mensaje a travs de Clinical cytogeneticist.   No podemos decirle cul ser su copago por los medicamentos por adelantado ya que esto es diferente dependiendo de la cobertura de su seguro. Sin embargo, es posible que podamos encontrar un medicamento sustituto a Audiological scientist un formulario para que el seguro cubra el medicamento que se considera necesario.   Si se requiere una autorizacin previa para que su compaa de seguros malta su medicamento, por  favor permtanos de 1 a 2 das hbiles para completar [address]  Los precios de los medicamentos varan con frecuencia dependiendo del Environmental consultant de dnde se surte la receta y alguna farmacias pueden ofrecer precios ms baratos.  El sitio web www.goodrx.com tiene cupones para medicamentos de Health and safety inspector. Los precios aqu no tienen en cuenta lo que podra costar con la ayuda del seguro (puede ser ms barato con su seguro), pero el sitio web puede darle el precio si no utiliz Tourist information centre manager.  - Puede imprimir el cupn correspondiente y llevarlo con su receta a la farmacia.  - Tambin puede pasar por nuestra oficina durante el horario de atencin regular y Education officer, museum una tarjeta de cupones de GoodRx.  - Si necesita que su receta se enve electrnicamente a una farmacia diferente, informe a nuestra oficina a travs de MyChart de [place] o por telfono llamando al [phone] y presione la opcin 4.

## 2023-08-16 ENCOUNTER — Telehealth: Payer: Self-pay

## 2023-08-16 NOTE — Telephone Encounter (Signed)
 Called patient to see how she was doing after yesterday's surgery, no concerns, patient states she will change bandage later this afternoon after 24 hours.

## 2023-08-17 ENCOUNTER — Encounter: Payer: Self-pay | Admitting: Internal Medicine

## 2023-08-17 ENCOUNTER — Other Ambulatory Visit (HOSPITAL_COMMUNITY): Payer: Self-pay

## 2023-08-17 DIAGNOSIS — E871 Hypo-osmolality and hyponatremia: Secondary | ICD-10-CM

## 2023-08-17 LAB — SURGICAL PATHOLOGY

## 2023-08-17 NOTE — Telephone Encounter (Signed)
 I have pended the order for your approval

## 2023-08-18 ENCOUNTER — Encounter (HOSPITAL_COMMUNITY): Payer: Self-pay | Admitting: Interventional Radiology

## 2023-08-18 ENCOUNTER — Other Ambulatory Visit: Payer: Self-pay | Admitting: Internal Medicine

## 2023-08-20 ENCOUNTER — Ambulatory Visit: Payer: Self-pay | Admitting: Dermatology

## 2023-08-20 ENCOUNTER — Encounter: Payer: Self-pay | Admitting: Internal Medicine

## 2023-08-20 ENCOUNTER — Other Ambulatory Visit (INDEPENDENT_AMBULATORY_CARE_PROVIDER_SITE_OTHER)

## 2023-08-20 ENCOUNTER — Ambulatory Visit (INDEPENDENT_AMBULATORY_CARE_PROVIDER_SITE_OTHER): Admitting: Dermatology

## 2023-08-20 DIAGNOSIS — E871 Hypo-osmolality and hyponatremia: Secondary | ICD-10-CM

## 2023-08-20 DIAGNOSIS — D099 Carcinoma in situ, unspecified: Secondary | ICD-10-CM

## 2023-08-20 DIAGNOSIS — D045 Carcinoma in situ of skin of trunk: Secondary | ICD-10-CM

## 2023-08-20 LAB — BASIC METABOLIC PANEL WITH GFR
BUN: 14 mg/dL (ref 6–23)
CO2: 26 meq/L (ref 19–32)
Calcium: 9 mg/dL (ref 8.4–10.5)
Chloride: 104 meq/L (ref 96–112)
Creatinine, Ser: 0.83 mg/dL (ref 0.40–1.20)
GFR: 72.85 mL/min (ref >60.00)
Glucose, Bld: 101 mg/dL — ABNORMAL HIGH (ref 70–99)
Potassium: 4.2 meq/L (ref 3.5–5.1)
Sodium: 137 meq/L (ref 135–145)

## 2023-08-20 MED ORDER — ZOLPIDEM TARTRATE ER 6.25 MG PO TBCR: 6.2500 mg | EXTENDED_RELEASE_TABLET | Freq: Every evening | ORAL | 5 refills | Status: DC | PRN

## 2023-08-20 NOTE — Progress Notes (Signed)
   Follow-Up Visit   Subjective  Adrienne Robinson is a 68 y.o. 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  Objective  Well appearing patient in no apparent distress; mood and affect are within normal limits.  Areas Examined: Mid sternum Relevant physical exam findings are noted in the Assessment and Plan.    Assessment & Plan    Encounter for Removal of Sutures - Incision site is clean, dry and intact. - Wound cleansed, sutures removed, wound cleansed and steri strips applied.  - Discussed pathology results showing residual squamous cell carcinoma in situe margins free at mid sternum  - Patient advised to keep steri-strips dry until they fall off. - Scars remodel for a full year. - Once steri-strips fall off, patient can apply over-the-counter silicone scar cream once to twice a day to help with scar remodeling if desired. - Patient advised to call with any concerns or if they notice any new or changing lesions.    Return in about 5 months (around 01/20/2024) for schedule in november for 6 mth tbse .  I, Eleanor Blush, CMA, am acting as scribe for Rexene Rattler, MD.   Documentation: I have reviewed the above documentation for accuracy and completeness, and I agree with the above.  Rexene Rattler, MD

## 2023-08-20 NOTE — Patient Instructions (Addendum)

## 2023-08-20 NOTE — Telephone Encounter (Signed)
 Zolpidem   Refilled: 05/21/2023 for 15 tablets with 5 refills. Pharmacist stated that pt has used up all 5 refills since March.  Last OV: 07/11/2023 Next OV: 10/12/2023

## 2023-08-22 ENCOUNTER — Ambulatory Visit: Payer: Self-pay | Admitting: Internal Medicine

## 2023-08-27 ENCOUNTER — Telehealth: Payer: Self-pay | Admitting: Sports Medicine

## 2023-08-27 ENCOUNTER — Other Ambulatory Visit: Payer: Self-pay | Admitting: Sports Medicine

## 2023-08-27 MED ORDER — MELOXICAM 15 MG PO TABS: 15.0000 mg | ORAL_TABLET | Freq: Every day | ORAL | 0 refills | Status: DC

## 2023-08-27 NOTE — Telephone Encounter (Signed)
 Refill sent.

## 2023-08-27 NOTE — Progress Notes (Unsigned)
 Refill sent.

## 2023-08-27 NOTE — Telephone Encounter (Signed)
 Pt states [facility] requested refill of Meloxicam , but we denied. Pt states that at last OV with Dr. Leonce, he verbally agreed to 1 more refill.

## 2023-08-27 NOTE — Telephone Encounter (Signed)
Pt informed of refill via phone.

## 2023-08-30 ENCOUNTER — Ambulatory Visit: Admitting: Family Medicine

## 2023-09-07 ENCOUNTER — Ambulatory Visit

## 2023-09-10 ENCOUNTER — Telehealth: Payer: Self-pay

## 2023-09-10 DIAGNOSIS — M810 Age-related osteoporosis without current pathological fracture: Secondary | ICD-10-CM

## 2023-09-10 NOTE — Telephone Encounter (Signed)
 Copied from CRM [id number]. Topic: General - Other >> Sep 10, 2023  1:56 PM Aisha D wrote: Reason for CRM: Pt would like to inform Latoya that [facility] was supposed to delivery her Evenity  injection on the 16th but they called and stated they needed to reschedule the delivery.

## 2023-09-11 ENCOUNTER — Ambulatory Visit

## 2023-09-12 NOTE — Telephone Encounter (Unsigned)
 Copied from CRM [id number]. Topic: Clinical - Prescription Issue >> Sep 12, 2023  9:57 AM [place] J wrote: Reason for CRM: Shon calling from [facility] wanting to let Dr. Marylynn know that they will be putting her Romosozumab -aqqg (EVENITY ) 105 MG/1. SOSY injection on hold since they havent been able to get a hold of the patient.

## 2023-09-13 ENCOUNTER — Encounter: Payer: Self-pay | Admitting: Internal Medicine

## 2023-09-13 DIAGNOSIS — D708 Other neutropenia: Secondary | ICD-10-CM

## 2023-09-13 DIAGNOSIS — E538 Deficiency of other specified B group vitamins: Secondary | ICD-10-CM

## 2023-09-13 DIAGNOSIS — E559 Vitamin D deficiency, unspecified: Secondary | ICD-10-CM

## 2023-09-13 DIAGNOSIS — D649 Anemia, unspecified: Secondary | ICD-10-CM

## 2023-09-13 DIAGNOSIS — E871 Hypo-osmolality and hyponatremia: Secondary | ICD-10-CM

## 2023-09-13 MED ORDER — HYDROCODONE-ACETAMINOPHEN 10-325 MG PO TABS: 1.0000 | ORAL_TABLET | Freq: Four times a day (QID) | ORAL | 0 refills | Status: DC | PRN

## 2023-09-14 ENCOUNTER — Ambulatory Visit: Admitting: Family Medicine

## 2023-09-25 ENCOUNTER — Other Ambulatory Visit: Payer: Self-pay | Admitting: Sports Medicine

## 2023-10-01 NOTE — Telephone Encounter (Signed)
 Would Adrienne Robinson be a good candidate for the Osteoporosis Clinic. Because we are having a very difficult time finding someone to fill her Evenity  prescription and take her [employer] copay card.  We have now tried 3-4 pharmacies with no success.

## 2023-10-02 NOTE — Addendum Note (Signed)
 Addended by: MARYLYNN VERNEITA CROME on: 10/02/2023 08:20 AM   Modules accepted: Orders

## 2023-10-04 NOTE — Telephone Encounter (Signed)
 Copied from CRM #[id number]. Topic: Appointments - Scheduling Inquiry for Clinic >> Oct 04, 2023  1:38 PM [place] E wrote: Reason for CRM: Patient called wanting to schedule her Evenity  injection, hoping this could be added onto her appointment that is August 22nd with PCP.  Are we ok to add this to patient's appointment?

## 2023-10-04 NOTE — Telephone Encounter (Signed)
 Are we still giving her the Evenity  injection since she has been referred to the osteoporosis clinic?

## 2023-10-10 ENCOUNTER — Telehealth: Payer: Self-pay

## 2023-10-10 NOTE — Telephone Encounter (Signed)
 We no longer supply this for her. If she has one we can give it to her. Otherwise she will need to get it from the Osteoporosis clinic.  I will send her a mychart message

## 2023-10-10 NOTE — Telephone Encounter (Signed)
 Copied from CRM [id number]. Topic: Appointments - Scheduling Inquiry for Clinic >> Oct 10, 2023 11:49 AM Mesmerise C wrote: Reason for CRM: Patient inquiring if her shot she needs for evenity  that's given by a nurse can be given to her on 8/22 at her appointment

## 2023-10-10 NOTE — Telephone Encounter (Signed)
 SPOKE WITH PHARMACY CONFIRMED DELIVERY. EXPECTED TO BE DELIVERED FRIDAY 10/12/23

## 2023-10-10 NOTE — Telephone Encounter (Unsigned)
 Copied from CRM #[id number]. Topic: General - Other >> Oct 10, 2023 12:55 PM Sasha M wrote: Reason for CRM: Annetta calling from [facility] specialty to verify office info to deliver pt medication of Evenity  syringe 105 mg 4 x 30 day supply on 10/12/2023. Please return call at [phone]

## 2023-10-11 ENCOUNTER — Other Ambulatory Visit (INDEPENDENT_AMBULATORY_CARE_PROVIDER_SITE_OTHER): Payer: PRIVATE HEALTH INSURANCE

## 2023-10-11 ENCOUNTER — Encounter: Payer: Self-pay | Admitting: Internal Medicine

## 2023-10-11 DIAGNOSIS — E871 Hypo-osmolality and hyponatremia: Secondary | ICD-10-CM

## 2023-10-11 DIAGNOSIS — D649 Anemia, unspecified: Secondary | ICD-10-CM

## 2023-10-11 DIAGNOSIS — E559 Vitamin D deficiency, unspecified: Secondary | ICD-10-CM | POA: Diagnosis not present

## 2023-10-11 DIAGNOSIS — E538 Deficiency of other specified B group vitamins: Secondary | ICD-10-CM

## 2023-10-11 LAB — CBC WITH DIFFERENTIAL/PLATELET
Basophils Absolute: 0 K/uL (ref 0.0–0.1)
Basophils Relative: 0.8 % (ref 0.0–3.0)
Eosinophils Absolute: 0.1 K/uL (ref 0.0–0.7)
Eosinophils Relative: 1.8 % (ref 0.0–5.0)
HCT: 32.4 % — ABNORMAL LOW (ref 36.0–46.0)
Hemoglobin: 11 g/dL — ABNORMAL LOW (ref 12.0–15.0)
Lymphocytes Relative: 25.4 % (ref 12.0–46.0)
Lymphs Abs: 1.1 K/uL (ref 0.7–4.0)
MCHC: 33.9 g/dL (ref 30.0–36.0)
MCV: 98.5 fl (ref 78.0–100.0)
Monocytes Absolute: 0.7 K/uL (ref 0.1–1.0)
Monocytes Relative: 15.7 % — ABNORMAL HIGH (ref 3.0–12.0)
Neutro Abs: 2.5 K/uL (ref 1.4–7.7)
Neutrophils Relative %: 56.3 % (ref 43.0–77.0)
Platelets: 318 K/uL (ref 150.0–400.0)
RBC: 3.29 Mil/uL — ABNORMAL LOW (ref 3.87–5.11)
RDW: 12.6 % (ref 11.5–15.5)
WBC: 4.5 K/uL (ref 4.0–10.5)

## 2023-10-11 LAB — COMPREHENSIVE METABOLIC PANEL WITH GFR
ALT: 15 U/L (ref 0–35)
AST: 14 U/L (ref 0–37)
Albumin: 4.3 g/dL (ref 3.5–5.2)
Alkaline Phosphatase: 95 U/L (ref 39–117)
BUN: 10 mg/dL (ref 6–23)
CO2: 26 meq/L (ref 19–32)
Calcium: 9.1 mg/dL (ref 8.4–10.5)
Chloride: 102 meq/L (ref 96–112)
Creatinine, Ser: 0.89 mg/dL (ref 0.40–1.20)
GFR: 66.93 mL/min (ref >60.00)
Glucose, Bld: 105 mg/dL — ABNORMAL HIGH (ref 70–99)
Potassium: 3.5 meq/L (ref 3.5–5.1)
Sodium: 139 meq/L (ref 135–145)
Total Bilirubin: 0.9 mg/dL (ref 0.2–1.2)
Total Protein: 6.8 g/dL (ref 6.0–8.3)

## 2023-10-11 LAB — B12 AND FOLATE PANEL
Folate: >23.4 ng/mL (ref >5.9)
Vitamin B-12: 220 pg/mL (ref 211–911)

## 2023-10-11 LAB — VITAMIN D 25 HYDROXY (VIT D DEFICIENCY, FRACTURES): VITD: 49.84 ng/mL (ref 30.00–100.00)

## 2023-10-12 ENCOUNTER — Ambulatory Visit (INDEPENDENT_AMBULATORY_CARE_PROVIDER_SITE_OTHER): Payer: PRIVATE HEALTH INSURANCE | Admitting: Internal Medicine

## 2023-10-12 ENCOUNTER — Encounter: Payer: Self-pay | Admitting: Internal Medicine

## 2023-10-12 VITALS — BP 122/82 | HR 69 | Temp 98.0°F | Ht 63.0 in | Wt 127.8 lb

## 2023-10-12 DIAGNOSIS — E538 Deficiency of other specified B group vitamins: Secondary | ICD-10-CM

## 2023-10-12 DIAGNOSIS — G894 Chronic pain syndrome: Secondary | ICD-10-CM

## 2023-10-12 DIAGNOSIS — M810 Age-related osteoporosis without current pathological fracture: Secondary | ICD-10-CM | POA: Diagnosis not present

## 2023-10-12 DIAGNOSIS — F32A Depression, unspecified: Secondary | ICD-10-CM

## 2023-10-12 DIAGNOSIS — F419 Anxiety disorder, unspecified: Secondary | ICD-10-CM

## 2023-10-12 LAB — IRON,TIBC AND FERRITIN PANEL
%SAT: 17 % (ref 16–45)
Ferritin: 156 ng/mL (ref 16–288)
Iron: 53 ug/dL (ref 45–160)
TIBC: 312 ug/dL (ref 250–450)

## 2023-10-12 MED ORDER — ROMOSOZUMAB-AQQG 105 MG/1.17ML ~~LOC~~ SOSY
210.0000 mg | PREFILLED_SYRINGE | Freq: Once | SUBCUTANEOUS | Status: AC
Start: 2023-10-12 — End: 2023-10-12
  Administered 2023-10-12: 210 mg via SUBCUTANEOUS

## 2023-10-12 MED ORDER — CYANOCOBALAMIN 1000 MCG/ML IJ SOLN
1000.0000 ug | Freq: Once | INTRAMUSCULAR | Status: AC
  Administered 2023-10-12: 1000 ug via INTRAMUSCULAR

## 2023-10-12 MED ORDER — ESOMEPRAZOLE MAGNESIUM 20 MG PO CPDR: 20.0000 mg | DELAYED_RELEASE_CAPSULE | Freq: Every day | ORAL | 0 refills | Status: AC

## 2023-10-12 MED ORDER — METOPROLOL SUCCINATE ER 25 MG PO TB24: 25.0000 mg | ORAL_TABLET | Freq: Every day | ORAL | 1 refills | Status: AC

## 2023-10-12 MED ORDER — LORATADINE 10 MG PO TABS: 10.0000 mg | ORAL_TABLET | Freq: Two times a day (BID) | ORAL | 11 refills | Status: AC

## 2023-10-12 MED ORDER — CYANOCOBALAMIN 1000 MCG/ML IJ SOLN: 1000.0000 ug | INTRAMUSCULAR | 2 refills | Status: AC

## 2023-10-12 MED ORDER — HYDROCODONE-ACETAMINOPHEN 10-325 MG PO TABS: 1.0000 | ORAL_TABLET | Freq: Four times a day (QID) | ORAL | 0 refills | Status: DC | PRN

## 2023-10-12 MED ORDER — BUPROPION HCL ER (XL) 150 MG PO TB24: 150.0000 mg | ORAL_TABLET | Freq: Every day | ORAL | 1 refills | Status: DC

## 2023-10-12 MED ORDER — AMLODIPINE BESYLATE 10 MG PO TABS: 10.0000 mg | ORAL_TABLET | Freq: Every day | ORAL | 3 refills | Status: AC

## 2023-10-12 MED ORDER — FAMCICLOVIR 500 MG PO TABS: 1000.0000 mg | ORAL_TABLET | Freq: Two times a day (BID) | ORAL | 1 refills | Status: AC

## 2023-10-12 NOTE — Progress Notes (Signed)
 Subjective:  Patient ID: Adrienne Robinson, female    DOB: 1955/09/13  Age: 68 y.o. MRN: [id number]  CC: The primary encounter diagnosis was B12 deficiency. Diagnoses of Osteoporosis, unspecified osteoporosis type, unspecified pathological fracture presence, Anxiety and depression, and Chronic pain syndrome were also pertinent to this visit.   HPI WHITNEY HILLEGASS presents for  Chief Complaint  Patient presents with   Medical Management of Chronic Issues    1) worsening  anemia:  She has not taken B12 injections in nearly 6 months.    2) osteoporosis : Evenity   approval finally received. To be given today   3)  hyponatremia resolved with 2 g sodium  bid .  Has not seen nephrology officially despite referral.   4) She has resigned from work due  to  [employer]  loss of vision and increased need or assistance .   5) Hypertension: patient checks blood pressure twice weekly at home.  Readings have been for the most part <130/80 at rest . Patient is following a reduced salt diet most days and is taking medications as prescribed    Outpatient Medications Prior to Visit  Medication Sig Dispense Refill   ARIPiprazole  (ABILIFY ) 2 MG tablet TAKE 1 TABLET BY MOUTH EVERY DAY 90 tablet 1   furosemide  (LASIX ) 20 MG tablet Take 1 tablet (20 mg total) by mouth daily with breakfast. As needed for edema 90 tablet 1   HYDROcodone -acetaminophen  (NORCO) 10-325 MG tablet Take 1-2 tablets by mouth every 6 (six) hours as needed for severe pain (pain score 7-10). 120 tablet 0   HYDROcodone -acetaminophen  (NORCO) 10-325 MG tablet Take 1 tablet by mouth every 6 (six) hours as needed. 120 tablet 0   LORazepam  (ATIVAN ) 0.5 MG tablet 1-2 tabs 30 - 60 min prior to MRI. Do not drive with this medicine. 4 tablet 0   meloxicam  (MOBIC ) 15 MG tablet Take 1 tablet (15 mg total) by mouth daily. 30 tablet 0   Multiple Vitamin (MULTIVITAMIN) tablet Take 1 tablet by mouth daily.     propranolol  (INDERAL ) 10 MG tablet TAKE 1/2  TABLET BY MOUTH AS NEEDED FOR ANXIETY 15 tablet 1   Romosozumab -aqqg (EVENITY ) 105 MG/1. SOSY injection Inject 210 mg into the skin every 30 (thirty) days. 2.4 mL 10   sodium chloride  1 g tablet Take 1 tablet (1 g total) by mouth 2 (two) times daily with a meal. 60 tablet 2   Syringe/Needle, Disp, (SYRINGE 3CC/25GX1) 25G X 1 3 ML MISC Use for b12 injections 50 each 0   zolpidem  (AMBIEN  CR) 6.25 MG CR tablet Take 1 tablet (6.25 mg total) by mouth at bedtime as needed for sleep. 30 tablet 5   amLODipine  (NORVASC ) 10 MG tablet Take 1 tablet (10 mg total) by mouth daily. 90 tablet 3   buPROPion  (WELLBUTRIN  XL) 150 MG 24 hr tablet Take 1 tablet (150 mg total) by mouth daily. 90 tablet 1   cyanocobalamin  (,VITAMIN B-12,) 1000 MCG/ML injection INJECT 1 ML (1,000 MCG TOTAL) INTO THE MUSCLE ONCE A WEEK. (Patient taking differently: Inject 1,000 mcg into the muscle every 30 (thirty) days.) 12 mL 6   esomeprazole  (NEXIUM ) 20 MG capsule TAKE 1 CAPSULE (20 MG TOTAL) BY MOUTH DAILY. **PLEASE CONTACT OFFICE TO SCHEDULE FOLLOW UP 90 capsule 0   famciclovir  (FAMVIR ) 500 MG tablet Take 2 tablets (1,000 mg total) by mouth 2 (two) times daily. 4 tablet 1   Iron -Vitamin C  65-125 MG TABS Take 1 tablet by mouth daily.  90 tablet 1   LINZESS  145 MCG CAPS capsule TAKE 1 CAPSULE BY MOUTH EVERY DAY BEFORE BREAKFAST 90 capsule 0   metoprolol  succinate (TOPROL -XL) 25 MG 24 hr tablet Take 1 tablet (25 mg total) by mouth daily. 90 tablet 1   No facility-administered medications prior to visit.    Review of Systems;  Patient denies headache, fevers, malaise, unintentional weight loss, skin rash, eye pain, sinus congestion and sinus pain, sore throat, dysphagia,  hemoptysis , cough, dyspnea, wheezing, chest pain, palpitations, orthopnea, edema, abdominal pain, nausea, melena, diarrhea, constipation, flank pain, dysuria, hematuria, urinary  Frequency, nocturia, numbness, tingling, seizures,  Focal weakness, Loss of  consciousness,  Tremor, insomnia, depression, anxiety, and suicidal ideation.      Objective:  BP 122/82   Pulse 69   Temp 98 F (36.7 C)   Ht 5' 3 (1.6 m)   Wt 127 lb 12.8 oz (58 kg)   SpO2 97%   BMI 22.64 kg/m   BP Readings from Last 3 Encounters:  10/12/23 122/82  07/30/23 124/70  07/11/23 130/80    Wt Readings from Last 3 Encounters:  10/12/23 127 lb 12.8 oz (58 kg)  07/30/23 126 lb (57.2 kg)  07/11/23 122 lb 12.8 oz (55.7 kg)    Physical Exam Vitals reviewed.  Constitutional:      General: She is not in acute distress.    Appearance: Normal appearance. She is normal weight. She is not ill-appearing, toxic-appearing or diaphoretic.  HENT:     Head: Normocephalic.  Eyes:     General: No scleral icterus.       Right eye: No discharge.        Left eye: No discharge.     Conjunctiva/sclera: Conjunctivae normal.  Cardiovascular:     Rate and Rhythm: Normal rate and regular rhythm.     Heart sounds: Normal heart sounds.  Pulmonary:     Effort: Pulmonary effort is normal. No respiratory distress.     Breath sounds: Normal breath sounds.  Musculoskeletal:        General: Normal range of motion.  Skin:    General: Skin is warm and dry.  Neurological:     General: No focal deficit present.     Mental Status: She is alert and oriented to person, place, and time. Mental status is at baseline.  Psychiatric:        Mood and Affect: Mood normal.        Behavior: Behavior normal.        Thought Content: Thought content normal.        Judgment: Judgment normal.     Lab Results  Component Value Date   HGBA1C 5.7 08/04/2011    Lab Results  Component Value Date   CREATININE 0.89 10/11/2023   CREATININE 0.83 08/20/2023   CREATININE 0.74 07/20/2023    Lab Results  Component Value Date   WBC 4.5 10/11/2023   HGB 11.0 (L) 10/11/2023   HCT 32.4 (L) 10/11/2023   PLT 318.0 10/11/2023   GLUCOSE 105 (H) 10/11/2023   CHOL 206 (H) 11/04/2021   TRIG 170 (H)  11/04/2021   HDL 57 11/04/2021   LDLDIRECT 81.0 01/22/2020   LDLCALC 120 (H) 11/04/2021   ALT 15 10/11/2023   AST 14 10/11/2023   NA 139 10/11/2023   K 3.5 10/11/2023   CL 102 10/11/2023   CREATININE 0.89 10/11/2023   BUN 10 10/11/2023   CO2 26 10/11/2023   TSH 0.979 01/16/2022  INR 1.0 07/12/2020   HGBA1C 5.7 08/04/2011    DG Lumbar Spine 2-3 Views Result Date: 07/06/2023 CLINICAL DATA:  Low back pain after lifting heavy suitcase 3 days ago. EXAM: LUMBAR SPINE - 2-3 VIEW COMPARISON:  September 29, 2020 FINDINGS: 50% compression deformities are noted at T10, T11 and T12 more advanced compared to prior exam. Evaluation is limited on this lumbar x-ray. Scoliosis. Grade 1 anterolisthesis of L5 on S1. Severe narrow intervertebral spaces are identified at L2-3, L3-4. Moderate narrow intervertebral space is noted at L4-5, L5-S1. Facet joint sclerosis are noted at L3-4, L4-5 and L5-S1. IMPRESSION: 1. 50% compression deformities are noted at T10, T11 and T12 more advanced compared to prior exam. Evaluation is limited on this lumbar x-ray. Recommend further evaluation with thoracic spine x-rays. 2. 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 Electronically Signed   By: Craig Farr M.D.   On: 07/06/2023 11:30    Assessment & Plan:  .B12 deficiency Assessment & Plan: Recurrent , secondary to lapse IN THERAPY,  has not injected or taken oral supplements  in 6 months   Orders: -     Cyanocobalamin   Osteoporosis, unspecified osteoporosis type, unspecified pathological fracture presence -     Romosozumab -aqqg  Anxiety and depression Assessment & Plan: Currently taking wellbutrin  XL 150 mg daily and abilify  2 mg daily  with improved mood.  No changes today.   Chronic pain syndrome Assessment & Plan: Anzlee continues to use  pharmacologic and non pharmacologic methods to manage her arthritis pain  .  she is using a standing desk for work mode and opioids for non operable  pain.  She has not had any ER visits for pain management and has not requested any early refills.  Her Refill history was confirmed via [place] Controlled Substance database by me today during her visit and there have been no prescriptions of controlled substances filled from any providers other than me.   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      Other orders -     amLODIPine  Besylate; Take 1 tablet (10 mg total) by mouth daily.  Dispense: 90 tablet; Refill: 3 -     buPROPion  HCl ER (XL); Take 1 tablet (150 mg total) by mouth daily.  Dispense: 90 tablet; Refill: 1 -     Metoprolol  Succinate ER; Take 1 tablet (25 mg total) by mouth daily.  Dispense: 90 tablet; Refill: 1 -     Loratadine ; Take 1 tablet (10 mg total) by mouth 2 times daily at 12 noon and 4 pm.  Dispense: 60 tablet; Refill: 11 -     Esomeprazole  Magnesium ; Take 1 capsule (20 mg total) by mouth daily.  Dispense: 90 capsule; Refill: 0 -     Cyanocobalamin ; Inject 1 mL (1,000 mcg total) into the muscle once a week.  Dispense: 4 mL; Refill: 2 -     Famciclovir ; Take 2 tablets (1,000 mg total) by mouth 2 (two) times daily.  Dispense: 4 tablet; Refill: 1     Follow-up: Return in about 3 months (around 01/12/2024) for chronic pain management.   Verneita LITTIE Kettering, MD

## 2023-10-12 NOTE — Assessment & Plan Note (Addendum)
 Recurrent , secondary to lapse IN THERAPY,  has not injected or taken oral supplements  in 6 months

## 2023-10-14 ENCOUNTER — Ambulatory Visit: Payer: Self-pay | Admitting: Internal Medicine

## 2023-10-14 NOTE — Assessment & Plan Note (Signed)
 Currently taking wellbutrin  XL 150 mg daily and abilify  2 mg daily  with improved mood.  No changes today.

## 2023-10-14 NOTE — Assessment & Plan Note (Signed)
 Adrienne Robinson continues to use  pharmacologic and non pharmacologic methods to manage her arthritis pain  .  she is using a standing desk for work mode and opioids for non operable pain.  She has not had any ER visits for pain management and has not requested any early refills.  Her Refill history was confirmed via [place] Controlled Substance database by me today during her visit and there have been no prescriptions of controlled substances filled from any providers other than me.   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

## 2023-10-15 ENCOUNTER — Encounter: Payer: Self-pay | Admitting: *Deleted

## 2023-10-15 ENCOUNTER — Other Ambulatory Visit (HOSPITAL_COMMUNITY): Payer: Self-pay

## 2023-10-15 ENCOUNTER — Telehealth: Payer: Self-pay

## 2023-10-15 NOTE — Telephone Encounter (Signed)
 Evenity  VOB initiated via [email or web address]  Last Evenity  inj: 10/12/23 Next Evenity  inj DUE: 11/12/23

## 2023-10-16 ENCOUNTER — Other Ambulatory Visit: Payer: Self-pay | Admitting: Internal Medicine

## 2023-10-19 ENCOUNTER — Other Ambulatory Visit (HOSPITAL_COMMUNITY): Payer: Self-pay

## 2023-10-19 ENCOUNTER — Encounter: Payer: Self-pay | Admitting: Internal Medicine

## 2023-10-19 NOTE — Telephone Encounter (Signed)
 Place a call to [employer] to check on benefit coverage for Evenity .   Should be updated within 24 to 48 hours.   Medicare will be primary and Sherleen will be the secondary.

## 2023-10-23 ENCOUNTER — Other Ambulatory Visit (HOSPITAL_COMMUNITY): Payer: Self-pay

## 2023-10-23 NOTE — Telephone Encounter (Signed)
   MEDICARE PART B NOT ACTIVE UNTIL 03/2024  Resubmitted in [employer] with [employer]

## 2023-10-24 ENCOUNTER — Ambulatory Visit (INDEPENDENT_AMBULATORY_CARE_PROVIDER_SITE_OTHER): Payer: PRIVATE HEALTH INSURANCE | Admitting: Dermatology

## 2023-10-24 DIAGNOSIS — L578 Other skin changes due to chronic exposure to nonionizing radiation: Secondary | ICD-10-CM | POA: Diagnosis not present

## 2023-10-24 DIAGNOSIS — W908XXA Exposure to other nonionizing radiation, initial encounter: Secondary | ICD-10-CM

## 2023-10-24 DIAGNOSIS — L820 Inflamed seborrheic keratosis: Secondary | ICD-10-CM | POA: Diagnosis not present

## 2023-10-24 DIAGNOSIS — L814 Other melanin hyperpigmentation: Secondary | ICD-10-CM | POA: Diagnosis not present

## 2023-10-24 DIAGNOSIS — Z7189 Other specified counseling: Secondary | ICD-10-CM

## 2023-10-24 MED ORDER — VALACYCLOVIR HCL 500 MG PO TABS: ORAL_TABLET | ORAL | 0 refills | Status: DC

## 2023-10-24 NOTE — Patient Instructions (Addendum)
 Counseling for BBL / IPL / Laser and Coordination of Care Discussed the treatment option of Broad Band Light (BBL) /Intense Pulsed Light (IPL)/ Laser for skin discoloration, including brown spots and redness.  Typically we recommend at least 1-3 treatment sessions about 5-8 weeks apart for best results.  Cannot have tanned skin when BBL performed, and regular use of sunscreen/photoprotection is advised after the procedure to help maintain results. The patient's condition may also require maintenance treatments in the future.  The fee for BBL / laser treatments is $350 per treatment session for the whole face.  A fee can be quoted for other parts of the body.  Insurance typically does not pay for BBL/laser treatments and therefore the fee is an out-of-pocket cost. Recommend prophylactic valtrex  treatment. Once scheduled for procedure, will send Rx in prior to patient's appointment.    Cryotherapy Aftercare  Wash gently with soap and water everyday.   Apply Vaseline and Band-Aid daily until healed.    Due to recent changes in healthcare laws, you may see results of your pathology and/or laboratory studies on MyChart before the doctors have had a chance to review them. We understand that in some cases there may be results that are confusing or concerning to you. Please understand that not all results are received at the same time and often the doctors may need to interpret multiple results in order to provide you with the best plan of care or course of treatment. Therefore, we ask that you please give us  2 business days to thoroughly review all your results before contacting the office for clarification. Should we see a critical lab result, you will be contacted sooner.   If You Need Anything After Your Visit  If you have any questions or concerns for your doctor, please call our main line at [phone] and press option 4 to reach your doctor's medical assistant. If no one answers, please leave a  voicemail as directed and we will return your call as soon as possible. Messages left after 4 pm will be answered the following business day.   You may also send us  a message via MyChart. We typically respond to MyChart messages within 1-2 business days.  For prescription refills, please ask your pharmacy to contact our office. Our fax number is [phone].  If you have an urgent issue when the clinic is closed that cannot wait until the next business day, you can page your doctor at the number below.    Please note that while we do our best to be available for urgent issues outside of office hours, we are not available 24/7.   If you have an urgent issue and are unable to reach us , you may choose to seek medical care at your doctor's office, retail clinic, urgent care center, or emergency room.  If you have a medical emergency, please immediately call 911 or go to the emergency department.  Pager Numbers  - Dr. Hester: [phone]  - Dr. Jackquline: [phone]  - Dr. Claudene: [phone]   - Dr. Raymund: [phone]  In the event of inclement weather, please call our main line at [phone] for an update on the status of any delays or closures.  Dermatology Medication Tips: Please keep the boxes that topical medications come in in order to help keep track of the instructions about where and how to use these. Pharmacies typically print the medication instructions only on the boxes and not directly on the medication tubes.   If your medication  is too expensive, please contact our office at [phone] option 4 or send us  a message through MyChart.   We are unable to tell what your co-pay for medications will be in advance as this is different depending on your insurance coverage. However, we may be able to find a substitute medication at lower cost or fill out paperwork to get insurance to cover a needed medication.   If a prior authorization is required to get your medication  covered by your insurance company, please allow us  1-2 business days to complete this process.  Drug prices often vary depending on where the prescription is filled and some pharmacies may offer cheaper prices.  The website www.goodrx.com contains coupons for medications through different pharmacies. The prices here do not account for what the cost may be with help from insurance (it may be cheaper with your insurance), but the website can give you the price if you did not use any insurance.  - You can print the associated coupon and take it with your prescription to the pharmacy.  - You may also stop by our office during regular business hours and pick up a GoodRx coupon card.  - If you need your prescription sent electronically to a different pharmacy, notify our office through [facility] or by phone at [phone] option 4.     Si Usted Necesita Algo Despus de Su Visita  Tambin puede enviarnos un mensaje a travs de Clinical cytogeneticist. Por lo general respondemos a los mensajes de MyChart en el transcurso de 1 a 2 das hbiles.  Para renovar recetas, por favor pida a su farmacia que se ponga en contacto con nuestra oficina. Randi lakes de fax es [place] [phone].  Si tiene un asunto urgente cuando la clnica est cerrada y que no puede esperar hasta el siguiente da hbil, puede llamar/localizar a su doctor(a) al nmero que aparece a continuacin.   Por favor, tenga en cuenta que aunque hacemos todo lo posible para estar disponibles para asuntos urgentes fuera del horario de [place], no estamos disponibles las 24 horas del da, los 7 [address]  [address] de la [place].   Si tiene un problema urgente y no puede comunicarse con nosotros, puede optar por buscar atencin mdica  en el consultorio de su doctor(a), en una clnica privada, en un centro de atencin urgente o en una sala de emergencias.  Si tiene Engineer, drilling, por favor llame inmediatamente al 911 o vaya a la sala de  emergencias.  Nmeros de bper  - Dr. Hester: [phone]  - Dra. Jackquline: [phone]  - Dr. Claudene: [phone]  - Dra. Kitts: [phone]  En caso de inclemencias del [place], por favor llame a nuestra lnea principal al [phone] para una actualizacin sobre el estado de cualquier retraso o cierre.  Consejos para la medicacin en dermatologa: Por favor, guarde las cajas en las que vienen los medicamentos de uso tpico para ayudarle a seguir las instrucciones sobre dnde y cmo usarlos. Las farmacias generalmente imprimen las instrucciones del medicamento slo en las cajas y no directamente en los tubos del [place].   Si su medicamento es muy caro, por favor, pngase en contacto con landry rieger llamando al [phone] y presione la opcin 4 o envenos un mensaje a travs de Clinical cytogeneticist.   No podemos decirle cul ser su copago por los medicamentos por adelantado ya que esto es diferente dependiendo de la cobertura de su seguro. Sin embargo, es posible que podamos encontrar un medicamento sustituto a Engineering geologist o  llenar un formulario para que el seguro cubra el medicamento que se considera necesario.   Si se requiere una autorizacin previa para que su compaa de seguros malta su medicamento, por favor permtanos de 1 a 2 das hbiles para completar este proceso.  Los precios de los medicamentos varan con frecuencia dependiendo del Environmental consultant de dnde se surte la receta y alguna farmacias pueden ofrecer precios ms baratos.  El sitio web www.goodrx.com tiene cupones para medicamentos de Health and safety inspector. Los precios aqu no tienen en cuenta lo que podra costar con la ayuda del seguro (puede ser ms barato con su seguro), pero el sitio web puede darle el precio si no utiliz Tourist information centre manager.  - Puede imprimir el cupn correspondiente y llevarlo con su receta a la farmacia.  - Tambin puede pasar por nuestra oficina durante el horario de atencin regular y Education officer, museum una tarjeta  de cupones de GoodRx.  - Si necesita que su receta se enve electrnicamente a una farmacia diferente, informe a nuestra oficina a travs de MyChart de [place] o por telfono llamando al [phone] y presione la opcin 4.

## 2023-10-24 NOTE — Progress Notes (Signed)
 Follow-Up Visit   Subjective  Adrienne Robinson is a 68 y.o. female who presents for the following: Spots on the face x 1-2 weeks, dry sore patches. Also a spot on the right chest to check, dry and irritated.  The patient has spots, moles and lesions to be evaluated, some may be new or changing.    The following portions of the chart were reviewed this encounter and updated as appropriate: medications, allergies, medical history  Review of Systems:  No other skin or systemic complaints except as noted in HPI or Assessment and Plan.  Objective  Well appearing patient in no apparent distress; mood and affect are within normal limits.  A focused examination was performed of the following areas: Face  Relevant physical exam findings are noted in the Assessment and Plan.  L zygoma x 2, R chest x 1 (3) Pink/tan scaly macule, slightly waxy.  Assessment & Plan  ACTINIC DAMAGE - chronic, secondary to cumulative UV radiation exposure/sun exposure over time - diffuse scaly erythematous macules with underlying dyspigmentation - Recommend daily broad spectrum sunscreen SPF 30+ to sun-exposed areas, reapply every 2 hours as needed.  - Recommend staying in the shade or wearing long sleeves, sun glasses (UVA+UVB protection) and wide brim hats (4-inch brim around the entire circumference of the hat). - Call for new or changing lesions.  LENTIGINES Exam: scattered tan macules face Due to sun exposure Treatment Plan: Benign-appearing, observe. Recommend daily broad spectrum sunscreen SPF 30+ to sun-exposed areas, reapply every 2 hours as needed.  Call for any changes  Counseling for BBL / IPL / Laser and Coordination of Care Discussed the treatment option of Broad Band Light (BBL) /Intense Pulsed Light (IPL)/ Laser for skin discoloration, including brown spots and redness.  Typically we recommend at least 1-3 treatment sessions about 5-8 weeks apart for best results.  Cannot have tanned skin  when BBL performed, and regular use of sunscreen/photoprotection is advised after the procedure to help maintain results. The patient's condition may also require maintenance treatments in the future.  The fee for BBL / laser treatments is $350 per treatment session for the whole face.  A fee can be quoted for other parts of the body.  Insurance typically does not pay for BBL/laser treatments and therefore the fee is an out-of-pocket cost. Recommend prophylactic valtrex  treatment. Rx sent in today. 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   INFLAMED SEBORRHEIC KERATOSIS (3) L zygoma x 2, R chest x 1 (3) vs Aks.  Actinic keratoses are precancerous spots that appear secondary to cumulative UV radiation exposure/sun exposure over time. They are chronic with expected duration over 1 year. A portion of actinic keratoses will progress to squamous cell carcinoma of the skin. It is not possible to reliably predict which spots will progress to skin cancer and so treatment is recommended to prevent development of skin cancer.  Recommend daily broad spectrum sunscreen SPF 30+ to sun-exposed areas, reapply every 2 hours as needed.  Recommend staying in the shade or wearing long sleeves, sun glasses (UVA+UVB protection) and wide brim hats (4-inch brim around the entire circumference of the hat). Call for new or changing lesions. Destruction of lesion - L zygoma x 2, R chest x 1 (3)  Destruction method: cryotherapy   Informed consent: discussed and consent obtained   Lesion destroyed using liquid nitrogen: Yes   Region frozen until ice ball extended beyond lesion: Yes   Outcome: patient tolerated procedure well with  no complications   Post-procedure details: wound care instructions given   Additional details:  Prior to procedure, discussed risks of blister formation, small wound, skin dyspigmentation, or rare scar following cryotherapy. Recommend Vaseline ointment to treated areas while  healing.     Return as scheduled, for TBSE. Pt will call back for BBL to face.SABRA LILLETTE Andrea Ezzard, CMA, am acting as scribe for Rexene Rattler, MD .   Documentation: I have reviewed the above documentation for accuracy and completeness, and I agree with the above.  Rexene Rattler, MD

## 2023-10-25 ENCOUNTER — Encounter: Payer: Self-pay | Admitting: Internal Medicine

## 2023-10-25 NOTE — Progress Notes (Deleted)
 [name] [address] [place] [address] Phone: [phone] Subjective:    I'm seeing this patient by the request  of:  Marylynn Verneita CROME, MD  CC: Back and neck pain follow-up  YEP:Dlagzrupcz  Adrienne Robinson is a 68 y.o. female coming in with complaint of back and neck pain. OMT on 06/21/2023. Patient states   Medications patient has been prescribed:   Taking:       Last lab test B12 low  Reviewed prior external information including notes and imaging from previsou exam, outside providers and external EMR if available.   As well as notes that were available from care everywhere and other healthcare systems.  Past medical history, social, surgical and family history all reviewed in electronic medical record.  No pertanent information unless stated regarding to the chief complaint.   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   Morphine And Codeine Rash     Review of Systems:  No headache, visual changes, nausea, vomiting, diarrhea, constipation, dizziness, abdominal pain, skin rash, fevers, chills, night sweats, weight loss, swollen lymph nodes, body aches, joint swelling, chest pain, shortness of breath, mood changes. POSITIVE muscle aches  Objective  There were no vitals taken for this visit.   General: No apparent distress alert and oriented x3 mood and affect normal, dressed appropriately.  HEENT: Pupils equal, extraocular movements intact  Respiratory: Patient's speak in full sentences and does not appear short of breath  Cardiovascular: No lower extremity edema, non tender, no erythema  Gait MSK:  Back   Osteopathic findings  C2 flexed rotated and side bent right C6 flexed rotated and side bent left T3 extended rotated and side bent right inhaled rib T9 extended rotated and side bent left L2 flexed rotated and side bent right Sacrum right on right       Assessment and Plan:  No problem-specific Assessment & Plan notes found for this encounter.    Nonallopathic problems  Decision today to treat with OMT was based on Physical Exam  After verbal consent patient was treated with HVLA, ME, FPR techniques in cervical, rib, thoracic, lumbar, and sacral  areas  Patient tolerated the procedure well with  improvement in symptoms  Patient given exercises, stretches and lifestyle modifications  See medications in patient instructions if given  Patient will follow up in 4-8 weeks    The above documentation has been reviewed and is accurate and complete [name], DO          Note: This dictation was prepared with Dragon dictation along with smaller phrase technology. Any transcriptional errors that result from this process are unintentional.

## 2023-10-29 ENCOUNTER — Ambulatory Visit: Admitting: Family Medicine

## 2023-10-29 ENCOUNTER — Telehealth: Payer: Self-pay | Admitting: Family Medicine

## 2023-10-29 ENCOUNTER — Other Ambulatory Visit: Payer: Self-pay

## 2023-10-29 MED ORDER — MELOXICAM 15 MG PO TABS: 15.0000 mg | ORAL_TABLET | Freq: Every day | ORAL | 0 refills | Status: DC

## 2023-10-29 NOTE — Telephone Encounter (Signed)
 Cannot verify patient's insurance in [employer]. [employer] was terminated on 08/20/23. Medicare part B does not start until 03/23/24.

## 2023-10-29 NOTE — Telephone Encounter (Signed)
 I see [employer] (Medicare supplement) and Medicare. Medicare part B does not start until 03/23/24.   Referral on 10/12/23 is for Evenity . Is patient on Evenity  or Prolia?

## 2023-10-29 NOTE — Telephone Encounter (Signed)
 Pt new insurance info was previously scanned into her chart. Are you not able to see that?  Also pt now gets her Prolia from [facility] as of 10/11/23

## 2023-10-29 NOTE — Telephone Encounter (Signed)
 Patient called and asked for a prescription for meloxicam  to calm down her arthritis a little bit. Please advise.

## 2023-11-05 ENCOUNTER — Encounter: Payer: Self-pay | Admitting: *Deleted

## 2023-11-05 NOTE — Telephone Encounter (Signed)
 She is on Evenity 

## 2023-11-07 ENCOUNTER — Other Ambulatory Visit (HOSPITAL_COMMUNITY): Payer: Self-pay

## 2023-11-08 ENCOUNTER — Encounter: Payer: Self-pay | Admitting: Internal Medicine

## 2023-11-08 DIAGNOSIS — G894 Chronic pain syndrome: Secondary | ICD-10-CM

## 2023-11-09 MED ORDER — HYDROCODONE-ACETAMINOPHEN 10-325 MG PO TABS: 1.0000 | ORAL_TABLET | Freq: Four times a day (QID) | ORAL | 0 refills | Status: DC | PRN

## 2023-11-10 MED ORDER — HYDROCODONE-ACETAMINOPHEN 10-325 MG PO TABS: 1.0000 | ORAL_TABLET | Freq: Four times a day (QID) | ORAL | 0 refills | Status: DC | PRN

## 2023-11-12 ENCOUNTER — Ambulatory Visit (INDEPENDENT_AMBULATORY_CARE_PROVIDER_SITE_OTHER): Payer: PRIVATE HEALTH INSURANCE

## 2023-11-12 DIAGNOSIS — Z23 Encounter for immunization: Secondary | ICD-10-CM | POA: Diagnosis not present

## 2023-11-12 DIAGNOSIS — E538 Deficiency of other specified B group vitamins: Secondary | ICD-10-CM | POA: Diagnosis not present

## 2023-11-12 DIAGNOSIS — M810 Age-related osteoporosis without current pathological fracture: Secondary | ICD-10-CM | POA: Diagnosis not present

## 2023-11-12 MED ORDER — ROMOSOZUMAB-AQQG 105 MG/1.17ML ~~LOC~~ SOSY
210.0000 mg | PREFILLED_SYRINGE | Freq: Once | SUBCUTANEOUS | Status: AC
  Administered 2023-11-12: 210 mg via SUBCUTANEOUS

## 2023-11-12 NOTE — Progress Notes (Signed)
 Pt received B12 injection in Left  deltoid muscle. Pt tolerated it well with no complaints or concerns.   Pt received EVENITY  injection in right AND Left deltoid muscle. Pt tolerated it well with no complaints or concerns.

## 2023-11-13 ENCOUNTER — Other Ambulatory Visit (HOSPITAL_COMMUNITY): Payer: Self-pay

## 2023-11-16 ENCOUNTER — Other Ambulatory Visit (HOSPITAL_COMMUNITY): Payer: Self-pay

## 2023-11-16 NOTE — Telephone Encounter (Signed)
 Looks like [employer] is either a Medicare Supplement or Evenity  is just not covered. Patient's Medicare Part B does not start until 03/23/24 and [employer] was terminated on 08/20/23. Eligibility search does not pull up any insurance. Please advise.

## 2023-11-16 NOTE — Telephone Encounter (Deleted)
 Please advise on patient's insurance. [employer] is a [employer]. Patient's Medicare Part B does not start until 03/23/24.

## 2023-11-19 NOTE — Telephone Encounter (Signed)
 See prior messages in thread. Patient gets her Evenity  from [facility] and has [employer] Information   Document Information  Insurance Card: Insurance Card  [employer] [place] 09.03.25  Attached on: 10/24/2023 10:37  Received on: 10/24/2023 10:37  Source Information  Delores Cower P  [facility]

## 2023-11-20 NOTE — Telephone Encounter (Signed)
 Could not verify benefits through [employer]. Called [facility]. Patient gets Evenity  through pharmacy benefits.

## 2023-11-21 ENCOUNTER — Ambulatory Visit: Payer: PRIVATE HEALTH INSURANCE | Admitting: Family Medicine

## 2023-11-21 ENCOUNTER — Telehealth: Payer: Self-pay

## 2023-11-21 NOTE — Telephone Encounter (Signed)
 Copied from CRM #[id number]. Topic: General - Other >> Nov 21, 2023 12:52 PM Robinson H wrote: Reason for CRM: Amy calling to speak with [facility] regarding benefit verification for the [facility] patient  Amy [employer] [id number]

## 2023-11-28 NOTE — Progress Notes (Deleted)
 Ben Jackson D.[facility]MENTEEN [facility] [address] [place] [address] Phone: [phone]   Assessment and Plan:     ***    Pertinent previous records reviewed include ***   Follow Up: ***     Subjective:   I, Adrienne Robinson, am serving as a Neurosurgeon for Doctor Morene Mace  Chief Complaint: Back pain   HPI:    06/21/23 Adrienne Robinson is a 68 y.o. female coming in with complaint of back and neck pain. OMT on 05/03/2023. F/u CMC joint pain. Patient states that she would like injection in L [facility] joint.     Would like trigger point injections distal to R scapula and medial to the L scapula.     Also f/u nodules in palm of R hand beneath the 3rd and 4th fingers causing fingers to go numb.    07/06/23 Patient states here for OMT after putting a suitcase in her car she heard and felt a pop mid spine on the right side. Patient states that she feels weak and unstable.      07/30/23 Patient states that just want to review the imaging cd that she brought into the office. Whatever he did last time really helped because the issue is better now. The combination of medication and OMT.   11/29/2023 Patient states   Relevant Historical Information: Hypertension, IBS, anemia  Additional pertinent review of systems negative.   Current Outpatient Medications:    amLODipine  (NORVASC ) 10 MG tablet, Take 1 tablet (10 mg total) by mouth daily., Disp: 90 tablet, Rfl: 3   ARIPiprazole  (ABILIFY ) 2 MG tablet, TAKE 1 TABLET BY MOUTH EVERY DAY, Disp: 90 tablet, Rfl: 1   buPROPion  (WELLBUTRIN  XL) 150 MG 24 hr tablet, Take 1 tablet (150 mg total) by mouth daily., Disp: 90 tablet, Rfl: 1   cyanocobalamin  (VITAMIN B12) 1000 MCG/ML injection, Inject 1 mL (1,000 mcg total) into the muscle once a week., Disp: 4 mL, Rfl: 2   esomeprazole  (NEXIUM ) 20 MG capsule, Take 1 capsule (20 mg total) by mouth daily., Disp: 90 capsule, Rfl: 0   famciclovir  (FAMVIR ) 500 MG tablet,  Take 2 tablets (1,000 mg total) by mouth 2 (two) times daily., Disp: 4 tablet, Rfl: 1   furosemide  (LASIX ) 20 MG tablet, Take 1 tablet (20 mg total) by mouth daily with breakfast. As needed for edema, Disp: 90 tablet, Rfl: 1   HYDROcodone -acetaminophen  (NORCO) 10-325 MG tablet, Take 1 tablet by mouth every 6 (six) hours as needed., Disp: 120 tablet, Rfl: 0   HYDROcodone -acetaminophen  (NORCO) 10-325 MG tablet, Take 1-2 tablets by mouth every 6 (six) hours as needed for severe pain (pain score 7-10)., Disp: 120 tablet, Rfl: 0   linaclotide  (LINZESS ) 145 MCG CAPS capsule, TAKE 1 CAPSULE BY MOUTH EVERY DAY BEFORE BREAKFAST, Disp: 90 capsule, Rfl: 0   loratadine  (CLARITIN ) 10 MG tablet, Take 1 tablet (10 mg total) by mouth 2 times daily at 12 noon and 4 pm., Disp: 60 tablet, Rfl: 11   LORazepam  (ATIVAN ) 0.5 MG tablet, 1-2 tabs 30 - 60 min prior to MRI. Do not drive with this medicine., Disp: 4 tablet, Rfl: 0   meloxicam  (MOBIC ) 15 MG tablet, Take 1 tablet (15 mg total) by mouth daily., Disp: 90 tablet, Rfl: 0   metoprolol  succinate (TOPROL -XL) 25 MG 24 hr tablet, Take 1 tablet (25 mg total) by mouth daily., Disp: 90 tablet, Rfl: 1   Multiple Vitamin (MULTIVITAMIN) tablet, Take 1 tablet by  mouth daily., Disp: , Rfl:    propranolol  (INDERAL ) 10 MG tablet, TAKE 1/2 TABLET BY MOUTH AS NEEDED FOR ANXIETY, Disp: 15 tablet, Rfl: 1   Romosozumab -aqqg (EVENITY ) 105 MG/1. SOSY injection, Inject 210 mg into the skin every 30 (thirty) days., Disp: 2.4 mL, Rfl: 10   sodium chloride  1 g tablet, Take 1 tablet (1 g total) by mouth 2 (two) times daily with a meal., Disp: 60 tablet, Rfl: 2   Syringe/Needle, Disp, (SYRINGE 3CC/25GX1) 25G X 1 3 ML MISC, Use for b12 injections, Disp: 50 each, Rfl: 0   valACYclovir  (VALTREX ) 500 MG tablet, Take 1 capsule by mouth twice daily x 1 week starting 1 day prior to procedure., Disp: 14 tablet, Rfl: 0   zolpidem  (AMBIEN  CR) 6.25 MG CR tablet, Take 1 tablet (6.25 mg total) by mouth  at bedtime as needed for sleep., Disp: 30 tablet, Rfl: 5   Objective:     There were no vitals filed for this visit.    There is no height or weight on file to calculate BMI.    Physical Exam:    ***   Electronically signed by:  Odis Mace [facility] 7:48 AM 11/28/23

## 2023-11-29 ENCOUNTER — Ambulatory Visit: Payer: PRIVATE HEALTH INSURANCE | Admitting: Sports Medicine

## 2023-12-04 ENCOUNTER — Ambulatory Visit: Admitting: Family Medicine

## 2023-12-04 NOTE — Telephone Encounter (Unsigned)
 Copied from CRM [id number]. Topic: Clinical - Medication Question >> Dec 04, 2023 10:19 AM Mercedes MATSU wrote: Reason for CRM: [facility] wanting to know if its okay to ship the patients medication to the office. Pharmacy can be reached at [phone] ask to speak with scheduling dept.

## 2023-12-06 NOTE — Telephone Encounter (Signed)
 Spoke with pharmacy and asked them to please ship monthly and make a note so that I don't have to call every month.

## 2023-12-11 NOTE — Telephone Encounter (Signed)
 Evenity  received this afternoon(12/11/23) after 3pm

## 2023-12-11 NOTE — Telephone Encounter (Signed)
 Evenity  not received as of 12/11/23.

## 2023-12-12 ENCOUNTER — Ambulatory Visit: Payer: PRIVATE HEALTH INSURANCE | Admitting: Dermatology

## 2023-12-13 ENCOUNTER — Ambulatory Visit: Payer: PRIVATE HEALTH INSURANCE

## 2023-12-14 ENCOUNTER — Ambulatory Visit: Payer: PRIVATE HEALTH INSURANCE

## 2023-12-18 ENCOUNTER — Ambulatory Visit: Payer: PRIVATE HEALTH INSURANCE

## 2023-12-26 ENCOUNTER — Ambulatory Visit: Payer: PRIVATE HEALTH INSURANCE | Admitting: Family Medicine

## 2024-01-04 ENCOUNTER — Encounter: Payer: Self-pay | Admitting: Internal Medicine

## 2024-01-04 DIAGNOSIS — G894 Chronic pain syndrome: Secondary | ICD-10-CM

## 2024-01-05 MED ORDER — HYDROCODONE-ACETAMINOPHEN 10-325 MG PO TABS: 1.0000 | ORAL_TABLET | Freq: Four times a day (QID) | ORAL | 0 refills | Status: DC | PRN

## 2024-01-11 ENCOUNTER — Ambulatory Visit: Payer: PRIVATE HEALTH INSURANCE | Admitting: Internal Medicine

## 2024-01-21 ENCOUNTER — Encounter: Payer: Self-pay | Admitting: *Deleted

## 2024-01-25 NOTE — Telephone Encounter (Signed)
 I did NOT receive this information. I was not made aware that she  decided to stop her Evenity . There is one here for her that we received from [facility].  Please notify patient that she can reach out to them and see if they will take it back. Or she can just go ahead and receive the one we have.  She does not read her mychart messages often.  In the meantime, I am archiving her Evenity  chart/card

## 2024-01-25 NOTE — Telephone Encounter (Signed)
 Call and spoke with patient to make her aware of Latoya's recommendations. Patient states she wanted to hold off on that because her new insurance is going to cover it.

## 2024-01-28 ENCOUNTER — Telehealth: Payer: PRIVATE HEALTH INSURANCE | Admitting: Internal Medicine

## 2024-01-28 ENCOUNTER — Encounter: Payer: Self-pay | Admitting: Internal Medicine

## 2024-01-28 ENCOUNTER — Encounter: Admitting: Dermatology

## 2024-01-28 VITALS — BP 115/75 | HR 65 | Ht 63.0 in | Wt 117.0 lb

## 2024-01-28 DIAGNOSIS — E871 Hypo-osmolality and hyponatremia: Secondary | ICD-10-CM

## 2024-01-28 DIAGNOSIS — F5104 Psychophysiologic insomnia: Secondary | ICD-10-CM

## 2024-01-28 DIAGNOSIS — M810 Age-related osteoporosis without current pathological fracture: Secondary | ICD-10-CM

## 2024-01-28 DIAGNOSIS — G894 Chronic pain syndrome: Secondary | ICD-10-CM

## 2024-01-28 DIAGNOSIS — R300 Dysuria: Secondary | ICD-10-CM

## 2024-01-28 DIAGNOSIS — F321 Major depressive disorder, single episode, moderate: Secondary | ICD-10-CM

## 2024-01-28 MED ORDER — HYDROCODONE-ACETAMINOPHEN 10-325 MG PO TABS: 1.0000 | ORAL_TABLET | Freq: Four times a day (QID) | ORAL | 0 refills | Status: AC | PRN

## 2024-01-28 MED ORDER — BUPROPION HCL ER (XL) 150 MG PO TB24: 150.0000 mg | ORAL_TABLET | Freq: Every day | ORAL | 1 refills | Status: AC

## 2024-01-28 MED ORDER — REXULTI 0.5 MG PO TABS: 0.5000 mg | ORAL_TABLET | Freq: Every day | ORAL | Status: DC

## 2024-01-28 MED ORDER — CIPROFLOXACIN HCL 250 MG PO TABS: 250.0000 mg | ORAL_TABLET | Freq: Two times a day (BID) | ORAL | 0 refills | Status: AC

## 2024-01-28 MED ORDER — BREXPIPRAZOLE 1 MG PO TABS: 1.0000 mg | ORAL_TABLET | Freq: Every day | ORAL | Status: DC

## 2024-01-28 NOTE — Addendum Note (Signed)
 Addended by: MARYLYNN VERNEITA CROME on: 01/28/2024 12:49 PM   Modules accepted: Orders

## 2024-01-28 NOTE — Progress Notes (Unsigned)
 Virtual Visit via Caregility   Note   This format is felt to be most appropriate for this patient at this time.  All issues noted in this document were discussed and addressed.  No physical exam was performed (except for noted visual exam findings with Video Visits).   I connected with Adrienne Robinson  on 01/28/24 at  5:00 PM EST by a video enabled telemedicine application  and verified that I am speaking with the correct person using two identifiers. Location patient: home Location provider: work or home office Persons participating in the virtual visit: patient, provider  I discussed the limitations, risks, security and privacy concerns of performing an evaluation and management service by telephone and the availability of in person appointments. I also discussed with the patient that there may be a patient responsible charge related to this service. The patient expressed understanding and agreed to proceed.  Reason for visit:   1) med refill 2) dysuria   HPI:   1) MDD:  trial of abilify  2 mg added to wellbutrin  150 mg daily failed.  Higher doses of wellbutrin  caused tachycardia,  wants to tey Auvelity (dextromethorphan/wellbutrin )   2) osteoporosisL  Evenity  cost prohibitive.  Going on medicare advantaged in January.  Discussed Reclast as alternative if covered    ROS: See pertinent positives and negatives per HPI.  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   Prepatellar effusion of right knee 06/09/2019   Aspiration done June 13, 2019   SCC (squamous cell carcinoma) 02/28/2023   mid sternum, EDC   Scoliosis    Spinal stenosis of lumbar region    Squamous cell carcinoma of skin 06/24/2018   R lateral calf   Tubular adenoma of colon     Past Surgical History:  Procedure Laterality Date   ABDOMINAL HYSTERECTOMY     APPENDECTOMY     CARDIAC CATHETERIZATION     normal coronaries, no wall motion abnormalities 11/02/09 [facility]   CATARACT EXTRACTION W/PHACO Left 11/24/2020   Procedure: CATARACT EXTRACTION PHACO AND INTRAOCULAR LENS PLACEMENT (IOC) LEFT 6.09 00:46.7;  Surgeon: Mittie Gaskin, MD;  Location: [facility];  Service: Ophthalmology;  Laterality: Left;  patient wants early   CATARACT EXTRACTION W/PHACO Right 12/08/2020   Procedure: CATARACT EXTRACTION PHACO AND INTRAOCULAR LENS PLACEMENT (IOC) RIGHT 3.92 00:49.0;  Surgeon: Mittie Gaskin, MD;  Location: [facility];  Service: Ophthalmology;  Laterality: Right;  patient request early   NASAL SINUS SURGERY     OOPHORECTOMY     RADIOLOGY WITH ANESTHESIA  02/26/2012   Procedure: RADIOLOGY WITH ANESTHESIA;  Surgeon: Thyra MARLA Nash, MD;  Location: [facility] OR;  Service: Radiology;  Laterality: N/A;   REVERSE SHOULDER ARTHROPLASTY Right 06/27/2013   Procedure: REVERSE RIGHT TOTAL SHOULDER ARTHROPLASTY;  Surgeon: Elspeth JONELLE Her, MD;  Location: [facility] OR;  Service: Orthopedics;  Laterality: Right;   SHOULDER ARTHROSCOPY WITH ROTATOR CUFF REPAIR AND SUBACROMIAL DECOMPRESSION  2007   left   TONSILLECTOMY  adnoids    Family History  Problem Relation Age of Onset   Breast cancer Mother 83   Thyroid  disease Mother    Colon cancer Father 72   Diabetes Father    Heart disease Father    Rectal cancer Father    Breast cancer Paternal Grandmother    Colon cancer Paternal Grandfather    Colon cancer Other        pggm   Stomach  cancer Neg Hx    Esophageal cancer Neg Hx     SOCIAL HX: ***   Current Outpatient Medications:    amLODipine  (NORVASC ) 10 MG tablet, Take 1 tablet (10 mg total) by mouth daily., Disp: 90 tablet, Rfl: 3   ciprofloxacin  (CIPRO ) 250 MG tablet, Take 1 tablet (250 mg total) by mouth 2 (two) times daily for 3 days., Disp: 6 tablet, Rfl: 0   cyanocobalamin  (VITAMIN B12) 1000 MCG/ML injection, Inject 1 mL (1,000 mcg total) into the muscle once a week., Disp: 4 mL, Rfl: 2   esomeprazole  (NEXIUM ) 20 MG capsule, Take 1 capsule (20 mg total) by mouth daily., Disp: 90 capsule, Rfl: 0   HYDROcodone -acetaminophen  (NORCO) 10-325 MG tablet, Take 1-2 tablets by mouth every 6 (six) hours as needed for severe pain (pain score 7-10)., Disp: 120 tablet, Rfl: 0   HYDROcodone -acetaminophen  (NORCO) 10-325 MG tablet, Take 1 tablet by mouth every 6 (six) hours as needed., Disp: 120 tablet, Rfl: 0   loratadine  (CLARITIN ) 10 MG tablet, Take 1 tablet (10 mg total) by mouth 2 times daily at 12 noon and 4 pm., Disp: 60 tablet, Rfl: 11   metoprolol  succinate (TOPROL -XL) 25 MG 24 hr tablet, Take 1 tablet (25 mg total) by mouth daily., Disp: 90 tablet, Rfl: 1   Multiple Vitamin (MULTIVITAMIN) tablet, Take 1 tablet by mouth daily., Disp: , Rfl:    Romosozumab -aqqg (EVENITY ) 105 MG/1. SOSY injection, Inject 210 mg into the skin every 30 (thirty) days., Disp: 2.4 mL, Rfl: 10   sodium chloride  1 g tablet, Take 1 tablet (1 g total) by mouth 2 (two) times daily with a meal., Disp: 60 tablet, Rfl: 2   Syringe/Needle, Disp, (SYRINGE 3CC/25GX1) 25G X 1 3 ML MISC, Use for b12 injections, Disp: 50 each, Rfl: 0   zolpidem  (AMBIEN  CR) 6.25 MG CR tablet, Take 1 tablet (6.25 mg total) by mouth at bedtime as needed for sleep., Disp: 30 tablet, Rfl: 5   ARIPiprazole  (ABILIFY ) 2 MG tablet, TAKE 1 TABLET BY MOUTH EVERY DAY (Patient not taking: Reported on 01/28/2024), Disp: 90 tablet, Rfl: 1   buPROPion  (WELLBUTRIN  XL) 150 MG 24 hr tablet,  Take 1 tablet (150 mg total) by mouth daily. (Patient not taking: Reported on 01/28/2024), Disp: 90 tablet, Rfl: 1   famciclovir  (FAMVIR ) 500 MG tablet, Take 2 tablets (1,000 mg total) by mouth 2 (two) times daily. (Patient not taking: Reported on 01/28/2024), Disp: 4 tablet, Rfl: 1   furosemide  (LASIX ) 20 MG tablet, Take 1 tablet (20 mg total) by mouth daily with breakfast. As needed for edema (Patient not taking: Reported on 01/28/2024), Disp: 90 tablet, Rfl: 1   linaclotide  (LINZESS ) 145 MCG CAPS capsule, TAKE 1 CAPSULE BY MOUTH EVERY DAY BEFORE BREAKFAST (Patient not taking: Reported on 01/28/2024), Disp: 90 capsule, Rfl: 0   LORazepam  (ATIVAN ) 0.5 MG tablet, 1-2 tabs 30 - 60 min  prior to MRI. Do not drive with this medicine. (Patient not taking: Reported on 01/28/2024), Disp: 4 tablet, Rfl: 0   propranolol  (INDERAL ) 10 MG tablet, TAKE 1/2 TABLET BY MOUTH AS NEEDED FOR ANXIETY (Patient not taking: Reported on 01/28/2024), Disp: 15 tablet, Rfl: 1   valACYclovir  (VALTREX ) 500 MG tablet, Take 1 capsule by mouth twice daily x 1 week starting 1 day prior to procedure. (Patient not taking: Reported on 01/28/2024), Disp: 14 tablet, Rfl: 0  EXAM:  VITALS per patient if applicable:  GENERAL: alert, oriented, appears well and in no acute distress  HEENT: atraumatic, conjunttiva clear, no obvious abnormalities on inspection of external nose and ears  NECK: normal movements of the head and neck  LUNGS: on inspection no signs of respiratory distress, breathing rate appears normal, no obvious gross SOB, gasping or wheezing  CV: no obvious cyanosis  MS: moves all visible extremities without noticeable abnormality  PSYCH/NEURO: pleasant and cooperative, no obvious depression or anxiety, speech and thought processing grossly intact  ASSESSMENT AND PLAN: There are no diagnoses linked to this encounter.    I discussed the assessment and treatment plan with the patient. The patient was provided an opportunity  to ask questions and all were answered. The patient agreed with the plan and demonstrated an understanding of the instructions.   The patient was advised to call back or seek an in-person evaluation if the symptoms worsen or if the condition fails to improve as anticipated.   I spent 30 minutes dedicated to the care of this patient on the date of this encounter to include pre-visit review of patient's medical history,  including recent ER visit, imaging studies and labs, face-to-face time with the patient , and post visit ordering of testing and therapeutics.    Verneita LITTIE Kettering, MD

## 2024-01-29 ENCOUNTER — Other Ambulatory Visit: Payer: PRIVATE HEALTH INSURANCE

## 2024-01-29 ENCOUNTER — Telehealth: Payer: Self-pay

## 2024-01-29 ENCOUNTER — Encounter: Admitting: Dermatology

## 2024-01-29 NOTE — Assessment & Plan Note (Signed)
 She has been prescribed Evenity  due to the severity of her osteoporosis with T scores of -3.3 ; however the cost is prohibitive and she would like to consider alternatives as she changes insurance to [employer] plan in Jan 2026.  Reclast infusions offered as an alternative for convenience and cost.

## 2024-01-29 NOTE — Telephone Encounter (Signed)
 Medication Samples have been provided to the patient.  Drug name: Rexulti        Strength: starter pack        Qty: 1 pack  LOT: [id number]  Exp.Date: 08/19/2025  Dosing instructions: Take 0.5 mg tablet for one week then increase to 1 mg daily.   The patient has been instructed regarding the correct time, dose, and frequency of taking this medication, including desired effects and most common side effects.   Adrienne Robinson 12:08 PM 01/29/2024   If unable to tolerate the 1 mg go back to taking the 0.5 mg tablet.   Medication Samples have been provided to the patient.  Drug name: Rexulti        Strength: 0.5 mg        Qty: 1 box  LOT: [id number]  Exp.Date: 10/20/2026  Dosing instructions: Take one tablet by mouth daily.   The patient has been instructed regarding the correct time, dose, and frequency of taking this medication, including desired effects and most common side effects.   Adrienne Robinson 12:09 PM 01/29/2024   If able to tolerate the 1 mg dose  Medication Samples have been provided to the patient.  Drug name: Rexulti        Strength: 1 mg        Qty: 1 box  LOT: [id number]  Exp.Date: 10/20/2026  Dosing instructions: Take one tablet by mouth daily.   The patient has been instructed regarding the correct time, dose, and frequency of taking this medication, including desired effects and most common side effects.   Adrienne Robinson 12:11 PM 01/29/2024

## 2024-01-29 NOTE — Assessment & Plan Note (Signed)
 Managed with ambien . . She is averaging 6 to 7 hours per night

## 2024-01-29 NOTE — Assessment & Plan Note (Signed)
 Past trials of trintellix  and abilfy more recently not effective.  Cannot increase wellbutrin  dose beyon 150 mg due to effects on pulse  adding rexulti  as a trial.  With samples of 0.5 mg and 1 mg given

## 2024-01-29 NOTE — Assessment & Plan Note (Signed)
 She has not had any ER visits  And has not requested any early refills.  Her Refill history was confirmed via [place] Controlled Substance database by me today during her visit and there have been no prescriptions of controlled substances filled from any providers other than me. .   Refilling hydrocodone  for use 4 times daily,  for 3 months

## 2024-01-29 NOTE — Assessment & Plan Note (Signed)
 Recurrent; etiology remains unclear. Lexapro  and prednisone  were considered as causative but episodes  have continued despite avoidance of both.  She has been seen by nephrology who addvised continued supplementation with salt tablets which has kept her from needing IV infusions for the last 6 months.

## 2024-01-30 NOTE — Telephone Encounter (Signed)
 Pt picked these samples up today

## 2024-02-01 ENCOUNTER — Other Ambulatory Visit: Payer: Self-pay | Admitting: Internal Medicine

## 2024-02-01 MED ORDER — PROPRANOLOL HCL 10 MG PO TABS: 10.0000 mg | ORAL_TABLET | Freq: Every day | ORAL | 5 refills | Status: AC | PRN

## 2024-02-01 NOTE — Telephone Encounter (Signed)
 Refilled 2 years ago by Kenney Roys, NP. Is it okay to refill?   Last OV: 01/28/2024 Next OV: not scheduled

## 2024-02-08 ENCOUNTER — Telehealth: Payer: Self-pay

## 2024-02-08 ENCOUNTER — Encounter: Payer: Self-pay | Admitting: Internal Medicine

## 2024-02-08 NOTE — Telephone Encounter (Signed)
 Copied from CRM [id number]. Topic: Clinical - Request for Lab/Test Order >> Feb 08, 2024 12:25 PM Adrienne Robinson wrote: Reason for CRM: Patient called in to be scheduled for her Evenity  injection

## 2024-02-11 ENCOUNTER — Other Ambulatory Visit: Payer: Self-pay | Admitting: Internal Medicine

## 2024-02-11 ENCOUNTER — Encounter: Payer: Self-pay | Admitting: Internal Medicine

## 2024-02-12 ENCOUNTER — Ambulatory Visit: Payer: PRIVATE HEALTH INSURANCE | Admitting: Family Medicine

## 2024-02-12 NOTE — Telephone Encounter (Signed)
 Okay to schedule, it is patient supplied and has been in our fridge for over a month. $0 due for injection

## 2024-02-13 ENCOUNTER — Telehealth: Payer: Self-pay | Admitting: Pharmacist

## 2024-02-13 NOTE — Progress Notes (Signed)
 Chart Review Reason: Drug information Question - Medication Coverage (Reclast)  Summary: Last Evenity  injection due after Christmas per patient report.   Reasonable to start Reclast at time of next due Evenity  injection (in place of Evenity ) ~1 month after last injection.   Insurance Coverage:  Medicare A/B [facility] [employer] Medicare Advantage PPO plan) #[id number] Per test claim: BIN [id number]; PCN ADV; ID [id number], GRP [id number]    Considerations: Patient can receive infusion at any [facility] infusion center.  [facility] will be closest location to patient.   Benefit investigation activated via placing Therapy Plan/order via Infusion Navigator in Epic Pended to PCP   Manuelita FABIENE Kobs, PharmD Clinical Pharmacist [facility] [phone]

## 2024-02-17 ENCOUNTER — Other Ambulatory Visit: Payer: Self-pay | Admitting: Internal Medicine

## 2024-02-17 DIAGNOSIS — M810 Age-related osteoporosis without current pathological fracture: Secondary | ICD-10-CM

## 2024-02-17 MED ORDER — BREXPIPRAZOLE 1 MG PO TABS: 1.0000 mg | ORAL_TABLET | Freq: Every day | ORAL | 5 refills | Status: DC

## 2024-02-17 MED ORDER — ZOLPIDEM TARTRATE ER 6.25 MG PO TBCR: 6.2500 mg | EXTENDED_RELEASE_TABLET | Freq: Every evening | ORAL | 5 refills | Status: AC | PRN

## 2024-02-17 NOTE — Assessment & Plan Note (Signed)
 Change in therapy to Reclast via [facility] infusion center.  Order signed

## 2024-02-18 NOTE — Telephone Encounter (Signed)
 Noted

## 2024-02-19 ENCOUNTER — Encounter: Payer: Self-pay | Admitting: Internal Medicine

## 2024-02-22 ENCOUNTER — Telehealth: Payer: Self-pay

## 2024-02-22 NOTE — Telephone Encounter (Signed)
 Medication Samples have been provided to the patient.  Drug name: Rexulti        Strength: 1 mg        Qty: 2 boxes   LOT: [id number]  Exp.Date: 09/2026  Dosing instructions: Take 1 tablet daily.   The patient has been instructed regarding the correct time, dose, and frequency of taking this medication, including desired effects and most common side effects.   [name] 10:41 AM 02/22/2024

## 2024-02-25 ENCOUNTER — Ambulatory Visit: Payer: PRIVATE HEALTH INSURANCE

## 2024-02-27 NOTE — Telephone Encounter (Signed)
Medication has been picked up.  

## 2024-02-29 ENCOUNTER — Telehealth: Payer: PRIVATE HEALTH INSURANCE | Admitting: Family Medicine

## 2024-02-29 ENCOUNTER — Encounter: Payer: Self-pay | Admitting: Family Medicine

## 2024-02-29 VITALS — BP 110/70 | HR 68 | Temp 98.6°F

## 2024-02-29 DIAGNOSIS — J0100 Acute maxillary sinusitis, unspecified: Secondary | ICD-10-CM

## 2024-02-29 DIAGNOSIS — J019 Acute sinusitis, unspecified: Secondary | ICD-10-CM | POA: Insufficient documentation

## 2024-02-29 MED ORDER — AMOXICILLIN-POT CLAVULANATE 875-125 MG PO TABS: 1.0000 | ORAL_TABLET | Freq: Two times a day (BID) | ORAL | 0 refills | Status: AC

## 2024-02-29 MED ORDER — PROMETHAZINE-DM 6.25-15 MG/5ML PO SYRP: 5.0000 mL | ORAL_SOLUTION | Freq: Three times a day (TID) | ORAL | 0 refills | Status: AC | PRN

## 2024-02-29 MED ORDER — PREDNISONE 10 MG PO TABS: ORAL_TABLET | ORAL | 0 refills | Status: AC

## 2024-02-29 NOTE — Progress Notes (Signed)
 "  Virtual Visit via Video Note  I connected with [name] on 02/29/2024 at 12:00 PM EST by a video enabled telemedicine application and verified that I am speaking with the correct person using two identifiers.  Patient Location: Home Provider Location: Office/Clinic  I discussed the limitations, risks, security, and privacy concerns of performing an evaluation and management service by video and the availability of in person appointments. I also discussed with the patient that there may be a patient responsible charge related to this service. The patient expressed understanding and agreed to proceed.  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 Negative for fever.  HENT:  Positive for congestion, ear pain and sinus pain. Negative for ear discharge and sore throat.        LNs in neck feel swollen  Eyes:  Negative for discharge.  Respiratory:  Positive for cough and sputum production. Negative for wheezing.   Neurological:  Positive for headaches.    Observations/Objective: Today's Vitals   02/29/24 1151  BP: 110/70  Pulse: 68  Temp: 98.6 F (37 C)    Physical Exam Patient appears well, in no distress Weight is baseline  No facial swelling or asymmetry Tenderness noted with self palp of maxillary sinuses worse on R Normal voice-not hoarse and no slurred speech No obvious tremor or mobility impairment Moving neck and UEs normally Able to hear the call well  Occational cough /dry sounding  Also sounds nasally congested  Talkative and mentally sharp with no cognitive changes No skin changes on face or neck , no rash or pallor Affect is normal   Assessment and Plan: Acute non-recurrent maxillary sinusitis Assessment & Plan: 14 d s/p influenza  Nasal congestion /purulent mucous , facial pain and ear pain worse on right side  No longer febrile  Persistent cough   Suspect bacterial sinusitis Augmentin  and prednisone  sent to pharmachy  Prometh  DM for cough prn Saline/steam  Update if not starting to improve in a week or if worsening  Call back and Er precautions noted in detail today     Other orders -     Amoxicillin -Pot Clavulanate; Take 1 tablet by mouth 2 (two) times daily.  Dispense: 14 tablet; Refill: 0 -     predniSONE ; Take 4 pills once daily by mouth for 3 days, then 3 pills daily for 3 days, then 2 pills daily for 3 days then 1 pill daily for 3 days then stop  Dispense: 30 tablet; Refill: 0 -  Promethazine -DM; Take 5 mLs by mouth 3 (three) times daily as needed for cough. Caution of sedation  Dispense: 118 mL; Refill: 0    Follow Up Instructions: No follow-ups on file.  I think you have a bacterial sinus infection   Take augmentin  and prednisone  as directed Drink fluids Breathe steam Try prometh -dm for cough with caution of sedation  Do not use the fast acting nasal spray for more than 2 days due to rebound congestion   Continue netti pot or nasal saline  Update if not starting to improve in a week or if worsening    If any severe symptoms, go to Er if after hours I discussed the assessment and  treatment plan with the patient. The patient was provided an opportunity to ask questions, and all were answered. The patient agreed with the plan and demonstrated an understanding of the instructions.   The patient was advised to call back or seek an in-person evaluation if the symptoms worsen or if the condition fails to improve as anticipated.  The above assessment and management plan was discussed with the patient. The patient verbalized understanding of and has agreed to the management plan.   Laine Balls, MD    [1]  Current Outpatient Medications:    amLODipine  (NORVASC ) 10 MG tablet, Take 1 tablet (10 mg total) by mouth daily., Disp: 90 tablet, Rfl: 3   amoxicillin -clavulanate (AUGMENTIN ) 875-125 MG tablet, Take 1 tablet by mouth 2 (two) times daily., Disp: 14 tablet, Rfl: 0   Brexpiprazole  (REXULTI ) 0.5 MG TABS, Take 1 tablet (0.5 mg total) by mouth daily., Disp: , Rfl:    brexpiprazole  (REXULTI ) 1 MG TABS tablet, Take 1 tablet (1 mg total) by mouth daily., Disp: , Rfl:    brexpiprazole  (REXULTI ) 1 MG TABS tablet, Take 1 tablet (1 mg total) by mouth daily., Disp: 30 tablet, Rfl: 5   buPROPion  (WELLBUTRIN  XL) 150 MG 24 hr tablet, Take 1 tablet (150 mg total) by mouth daily., Disp: 90 tablet, Rfl: 1   cyanocobalamin  (VITAMIN B12) 1000 MCG/ML injection, Inject 1 mL (1,000 mcg total) into the muscle once a week., Disp: 4 mL, Rfl: 2   esomeprazole  (NEXIUM ) 20 MG capsule, Take 1 capsule (20 mg total) by mouth daily., Disp: 90 capsule, Rfl: 0   HYDROcodone -acetaminophen  (NORCO) 10-325 MG tablet, Take 1-2 tablets by mouth every 6 (six) hours as needed for severe pain (pain score 7-10)., Disp: 120 tablet, Rfl: 0   HYDROcodone -acetaminophen  (NORCO) 10-325 MG tablet, Take 1 tablet by mouth every 6 (six) hours as needed., Disp: 120 tablet, Rfl: 0   loratadine  (CLARITIN ) 10 MG tablet, Take 1 tablet (10 mg total) by mouth 2 times daily at 12 noon and 4 pm., Disp: 60 tablet, Rfl: 11   metoprolol   succinate (TOPROL -XL) 25 MG 24 hr tablet, Take 1 tablet (25 mg total) by mouth daily., Disp: 90 tablet, Rfl: 1   Multiple Vitamin (MULTIVITAMIN) tablet, Take 1 tablet by mouth daily., Disp: , Rfl:    predniSONE  (DELTASONE ) 10 MG tablet, Take 4 pills once daily by mouth for 3 days, then 3 pills daily for 3 days, then 2 pills daily for 3 days then 1 pill daily for 3 days then stop, Disp: 30 tablet, Rfl: 0   promethazine -dextromethorphan (PROMETHAZINE -DM) 6.25-15 MG/5ML syrup, Take 5 mLs by mouth 3 (three) times daily as needed for cough. Caution of sedation, Disp: 118 mL, Rfl: 0   propranolol  (INDERAL ) 10 MG tablet, Take 1 tablet (10 mg total) by  mouth daily as needed., Disp: 30 tablet, Rfl: 5   Romosozumab -aqqg (EVENITY ) 105 MG/1. SOSY injection, Inject 210 mg into the skin every 30 (thirty) days., Disp: 2.4 mL, Rfl: 10   sodium chloride  1 g tablet, Take 1 tablet (1 g total) by mouth 2 (two) times daily with a meal., Disp: 60 tablet, Rfl: 2   Syringe/Needle, Disp, (SYRINGE 3CC/25GX1) 25G X 1 3 ML MISC, Use for b12 injections, Disp: 50 each, Rfl: 0   zolpidem  (AMBIEN  CR) 6.25 MG CR tablet, Take 1 tablet (6.25 mg total) by mouth at bedtime as needed for sleep., Disp: 30 tablet, Rfl: 5   famciclovir  (FAMVIR ) 500 MG tablet, Take 2 tablets (1,000 mg total) by mouth 2 (two) times daily. (Patient not taking: Reported on 02/29/2024), Disp: 4 tablet, Rfl: 1   furosemide  (LASIX ) 20 MG tablet, Take 1 tablet (20 mg total) by mouth daily with breakfast. As needed for edema (Patient not taking: Reported on 02/29/2024), Disp: 90 tablet, Rfl: 1   linaclotide  (LINZESS ) 145 MCG CAPS capsule, TAKE 1 CAPSULE BY MOUTH EVERY DAY BEFORE BREAKFAST (Patient not taking: Reported on 02/29/2024), Disp: 90 capsule, Rfl: 0  "

## 2024-02-29 NOTE — Patient Instructions (Signed)
 I think you have a bacterial sinus infection   Take augmentin  and prednisone  as directed Drink fluids Breathe steam Try prometh -dm for cough with caution of sedation  Do not use the fast acting nasal spray for more than 2 days due to rebound congestion   Continue netti pot or nasal saline  Update if not starting to improve in a week or if worsening    If any severe symptoms, go to Er if after hours

## 2024-02-29 NOTE — Assessment & Plan Note (Signed)
 14 d s/p influenza  Nasal congestion /purulent mucous , facial pain and ear pain worse on right side  No longer febrile  Persistent cough   Suspect bacterial sinusitis Augmentin  and prednisone  sent to pharmachy  Prometh  DM for cough prn Saline/steam  Update if not starting to improve in a week or if worsening  Call back and Er precautions noted in detail today

## 2024-03-04 ENCOUNTER — Encounter: Payer: Self-pay | Admitting: Internal Medicine

## 2024-03-04 DIAGNOSIS — F321 Major depressive disorder, single episode, moderate: Secondary | ICD-10-CM

## 2024-03-04 MED ORDER — NALOXONE HCL 4 MG/0.1ML NA LIQD: NASAL | 2 refills | Status: AC

## 2024-03-04 MED ORDER — ALPRAZOLAM 0.25 MG PO TABS: 0.2500 mg | ORAL_TABLET | Freq: Two times a day (BID) | ORAL | 0 refills | Status: AC | PRN

## 2024-03-05 MED ORDER — VRAYLAR 1.5 & 3 MG PO CPPK: ORAL_CAPSULE | ORAL | 0 refills | Status: DC

## 2024-03-05 NOTE — Addendum Note (Signed)
 Addended by: MARYLYNN VERNEITA CROME on: 03/05/2024 08:24 PM   Modules accepted: Orders

## 2024-03-05 NOTE — Assessment & Plan Note (Signed)
 Improved with Rexulti ,  but insuance is not coveringg. Trial of Vryalar

## 2024-03-06 ENCOUNTER — Other Ambulatory Visit: Payer: Self-pay | Admitting: Internal Medicine

## 2024-03-06 MED ORDER — CARIPRAZINE HCL 1.5 MG PO CAPS: 1.5000 mg | ORAL_CAPSULE | Freq: Every day | ORAL | 0 refills | Status: DC

## 2024-03-07 MED ORDER — CARIPRAZINE HCL 1.5 MG PO CAPS: 1.5000 mg | ORAL_CAPSULE | Freq: Every day | ORAL | 0 refills | Status: AC

## 2024-03-07 NOTE — Addendum Note (Signed)
 Addended by: SEBASTIAN KNEE on: 03/07/2024 11:49 AM   Modules accepted: Orders

## 2024-03-11 ENCOUNTER — Ambulatory Visit: Payer: Self-pay

## 2024-03-11 ENCOUNTER — Other Ambulatory Visit: Payer: Self-pay | Admitting: Internal Medicine

## 2024-03-11 MED ORDER — NYSTATIN 100000 UNIT/ML MT SUSP: 5.0000 mL | Freq: Four times a day (QID) | OROMUCOSAL | 0 refills | Status: AC

## 2024-03-11 NOTE — Telephone Encounter (Unsigned)
 Copied from CRM [id number]. Topic: Clinical - Medical Advice >> Mar 11, 2024 11:44 AM Charolett L wrote: Reason for CRM: Patient called in to verify that the Nurse or Dr.Tullo has read her messages and requesting a call back or reply.

## 2024-03-11 NOTE — Telephone Encounter (Signed)
 Dr. Marylynn was made aware of the mychart message.

## 2024-03-11 NOTE — Telephone Encounter (Signed)
 FYI Only or Action Required?: Action required by provider: request for thrush med.  Patient was last seen in primary care on 02/29/2024 by Randeen Laine LABOR, MD.  Called Nurse Triage reporting No chief complaint on file..  Symptoms began several days ago.  Interventions attempted: Nothing.  Symptoms are: stable.  Triage Disposition: No disposition on file.  Patient/caregiver understands and will follow disposition?:     Reason for Triage: Patient called in stating she has thrush due to the prednisone . Would like to know if she would need another appointment or not for it. Memorial service for husband is on Friday  Answer Assessment - Initial Assessment Questions Requesting mediation for thrush. Unable to make any virtual appt today   1. LOCATION: Where is the mouth sore (ulcer) located?      Red bumps on tongue with white coating 2. NUMBER: How many sores are there? :     several 3. SIZE: How large is the sore?  (e.g., size of an apple seed, watermelon seed, pencil eraser)     small 4. PAIN: Are they painful? If Yes, ask: How bad is it?  (Scale 0-10; or none, mild, moderate, severe)     Mild pain 5. ONSET: When did you first notice the sore?      Few days 6. RECURRENT SYMPTOM: Have you had a mouth ulcer before? If Yes, ask: When was the last time? and What happened that time?      Dx with thrush in past 7. CAUSE: What do you think is causing the mouth sore?     thrush 8. OTHER SYMPTOMS: Do you have any other symptoms? (e.g., fever, swollen lymph node)     no  Protocols used: Mouth Ulcers-A-AH

## 2024-03-12 ENCOUNTER — Ambulatory Visit: Payer: PRIVATE HEALTH INSURANCE

## 2024-03-19 ENCOUNTER — Ambulatory Visit: Payer: PRIVATE HEALTH INSURANCE

## 2024-03-20 ENCOUNTER — Ambulatory Visit: Payer: PRIVATE HEALTH INSURANCE | Admitting: Family Medicine

## 2024-03-20 ENCOUNTER — Ambulatory Visit: Payer: PRIVATE HEALTH INSURANCE

## 2024-03-21 ENCOUNTER — Ambulatory Visit: Payer: PRIVATE HEALTH INSURANCE

## 2024-03-21 ENCOUNTER — Ambulatory Visit: Payer: Self-pay

## 2024-03-21 NOTE — Telephone Encounter (Signed)
 LMTCB

## 2024-03-21 NOTE — Telephone Encounter (Signed)
 Left message on verified VM for pt advising her that I could hear her but she could not hear me. We had to move on from her appointment. She can call the office to see if they cancel her 10 am appt, could they put her in Dr Charlyn [id number] appt. Otherwise, she will need to try and reschedule at her PCP office or see what else is available today at other offices in our group.

## 2024-03-21 NOTE — Telephone Encounter (Signed)
 FYI Only or Action Required?: FYI only for provider: appointment scheduled on 1/30.  Patient was last seen in primary care on 02/29/2024 by Randeen Laine LABOR, MD.  Called Nurse Triage reporting Sinusitis.  Symptoms began several days ago.  Interventions attempted: Rest, hydration, or home remedies.  Symptoms are: gradually worsening.  Triage Disposition: See HCP Within 4 Hours (Or PCP Triage)  Patient/caregiver understands and will follow disposition?: Yes  Message from Mia F sent at 03/21/2024  8:04 AM EST  Reason for Triage: Green mucus, headache, sinus pressure when she leans forward. She says it started two days ago and seems to be getting worse. No SOB, chest discomfort, or fever.   Reason for Disposition  [1] Redness or swelling on the cheek, forehead or around the eye AND [2] no fever  Answer Assessment - Initial Assessment Questions For 2-3 days -Patient with sinus congestion, pain, green mucous, and occasional cough. She hosted her husbands funeral a few days ago and was hugged by multiple people and caught something.  Sinus headache, swollen cheeks and face, headache worse bending down. Mild intermittent cough.  Denies CP, SOB, Fever, earache.   Unable to make it to the office for her injection today due to feeling bad- Video visit made with Dr Bennett for this morning and injection rescheduled to next week.   1. LOCATION: Where does it hurt?      Cheeks and face are swollen  2. ONSET: When did the sinus pain start?  (e.g., hours, days)      2 days  3. SEVERITY: How bad is the pain?   (Scale 0-10; or none, mild, moderate or severe)     8/10- worse if bends over 4. RECURRENT SYMPTOM: Have you ever had sinus problems before? If Yes, ask: When was the last time? and What happened that time?      Prone to sinus infections 5. NASAL CONGESTION: Is the nose blocked? If Yes, ask: Can you open it or must you breathe through your mouth?     Bilateral  6. NASAL  DISCHARGE: Do you have discharge from your nose? If so ask, What color?     Green  7. FEVER: Do you have a fever? If Yes, ask: What is it, how was it measured, and when did it start?      Denies  8. OTHER SYMPTOMS: Do you have any other symptoms? (e.g., sore throat, cough, earache, difficulty breathing)     Cough- mild  Protocols used: Sinus Pain or Congestion-A-AH

## 2024-03-24 ENCOUNTER — Ambulatory Visit: Payer: PRIVATE HEALTH INSURANCE | Admitting: Dermatology

## 2024-03-25 ENCOUNTER — Ambulatory Visit: Payer: PRIVATE HEALTH INSURANCE

## 2024-03-26 ENCOUNTER — Ambulatory Visit: Payer: PRIVATE HEALTH INSURANCE | Admitting: Family Medicine

## 2024-03-28 ENCOUNTER — Telehealth: Payer: Self-pay

## 2024-03-28 NOTE — Telephone Encounter (Signed)
 Spoke with pt and she stated that she is feeling better.

## 2024-03-28 NOTE — Telephone Encounter (Signed)
 Requesting: Hydrocodone  Contract: No UDS: no Last Visit: 01/28/2024 Next Visit: not scheduled Last Refill: 01/28/2024  Please Advise

## 2024-03-28 NOTE — Telephone Encounter (Signed)
 LMTCB

## 2024-04-07 ENCOUNTER — Encounter: Admitting: Dermatology

## 2024-04-12 IMAGING — XA Imaging study
2 series · 2 of 2 positions shown · non-contrast
Comparison: none

CLINICAL DATA: Lumbosacral spondylosis without myelopathy. Good
response to prior epidural injections. Recurrent right-sided low
back pain.

[Series 1: ortho standard · 1 of 1 slices shown (1 of 2)]
[im 1/1]
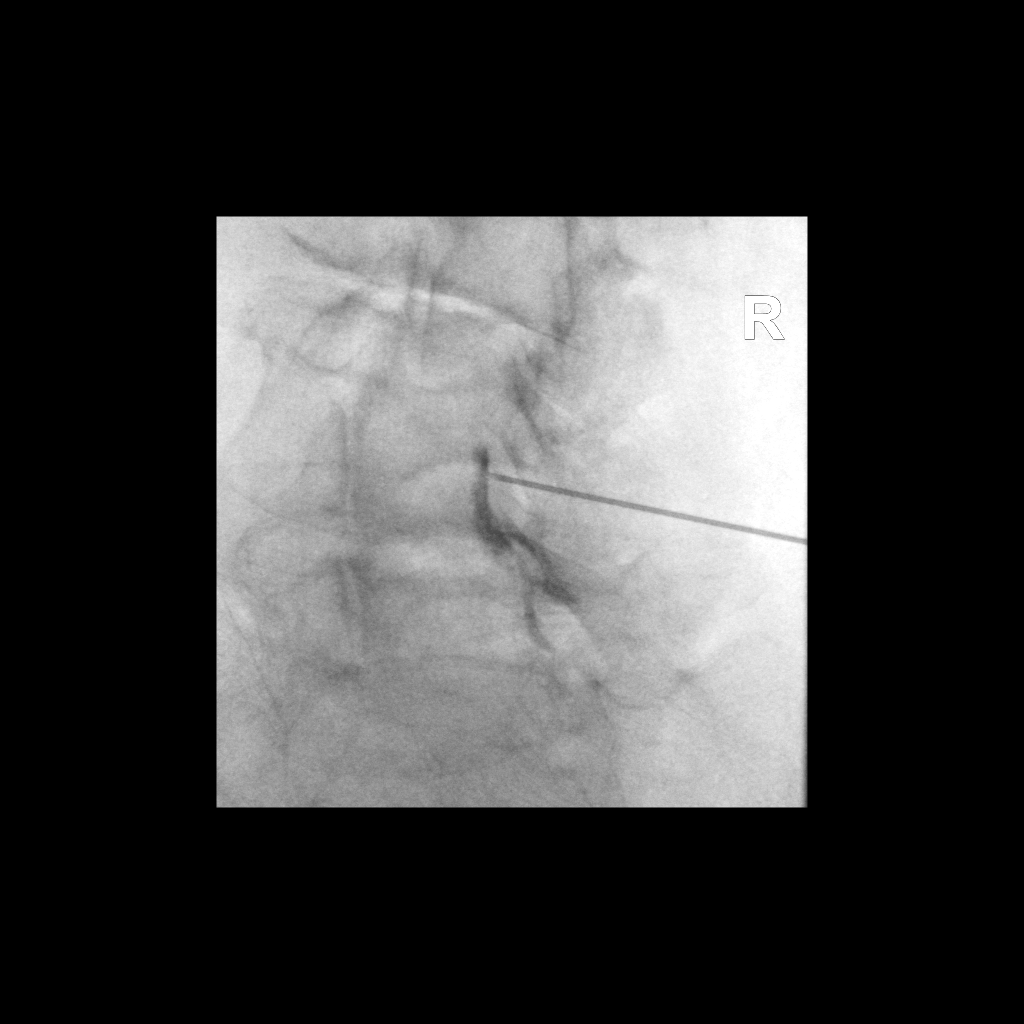

[Series 2: ortho standard · 1 of 1 slices shown (2 of 2)]
[im 1/1]
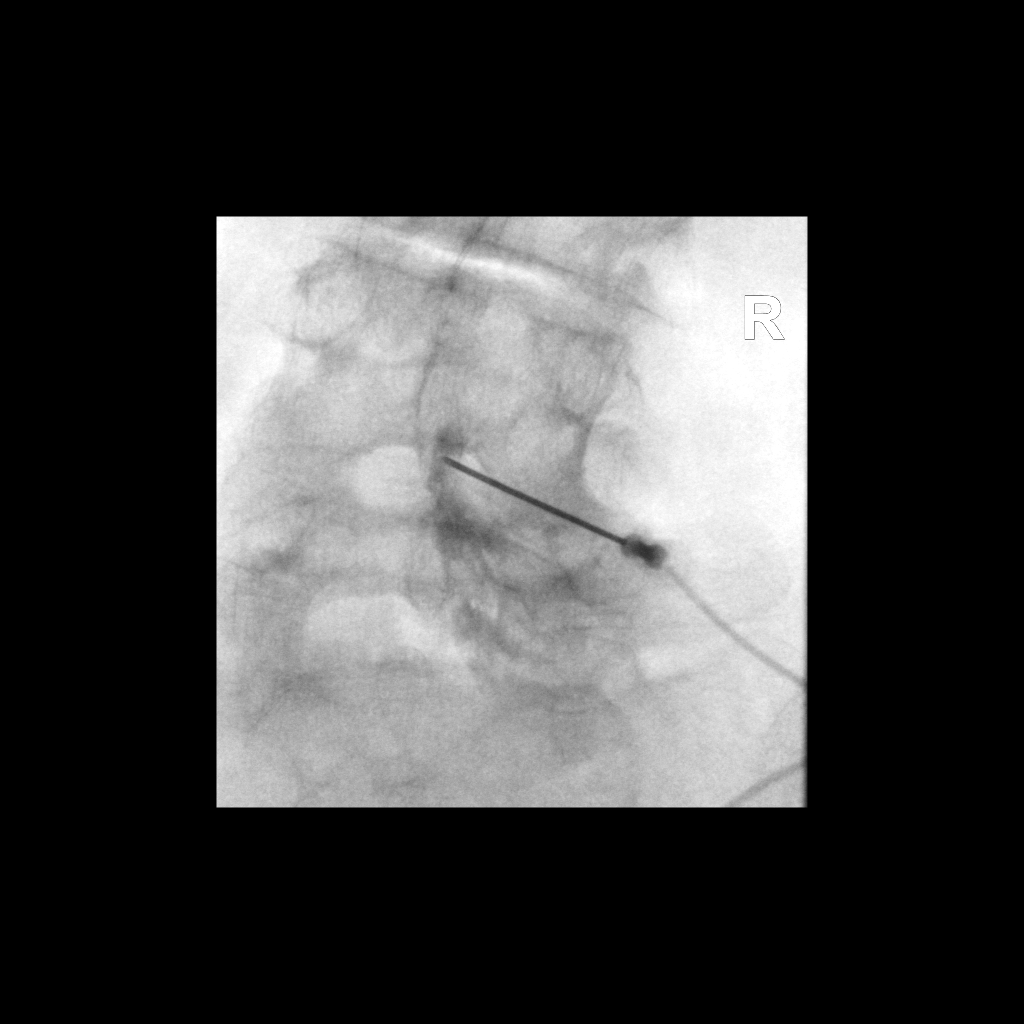

[2 of 2 positions shown; findings below may reference images not displayed]

FLUOROSCOPY:
Radiation Exposure Index (as provided by the fluoroscopic device):
1.20 mGy Kerma

PROCEDURE:
The procedure, risks, benefits, and alternatives were explained to
the patient. Questions regarding the procedure were encouraged and
answered. The patient understands and consents to the procedure.

LUMBAR EPIDURAL INJECTION:

An interlaminar approach was performed on the right at L4-5. The
overlying skin was cleansed and anesthetized. A 3.5 inch 20 gauge
epidural needle was advanced using loss-of-resistance technique.

DIAGNOSTIC EPIDURAL INJECTION:

Injection of Isovue-M 200 shows a good epidural pattern with spread
above and below the level of needle placement, primarily on the
right. No vascular opacification is seen.

THERAPEUTIC EPIDURAL INJECTION:

80 mg of Depo-Medrol mixed with 3 mL of 1% lidocaine were instilled.
The procedure was well-tolerated, and the patient was discharged
thirty minutes following the injection in good condition.

COMPLICATIONS:
None immediate
IMPRESSION: Technically successful interlaminar epidural injection on the right
at L4-5.

## 2024-06-02 ENCOUNTER — Ambulatory Visit: Payer: PRIVATE HEALTH INSURANCE | Admitting: Dermatology
